# Patient Record
Sex: Male | Born: 1940
Health system: Southern US, Academic
[De-identification: ages and names within clinical notes are randomized; demographics above are authoritative.]

## PROBLEM LIST (undated history)

## (undated) ENCOUNTER — Encounter

## (undated) ENCOUNTER — Telehealth

## (undated) ENCOUNTER — Ambulatory Visit: Payer: MEDICARE | Attending: Surgery | Primary: Surgery

## (undated) ENCOUNTER — Ambulatory Visit

## (undated) ENCOUNTER — Ambulatory Visit: Payer: MEDICARE

## (undated) ENCOUNTER — Ambulatory Visit: Payer: MEDICARE | Attending: Anesthesiology | Primary: Anesthesiology

## (undated) ENCOUNTER — Ambulatory Visit: Payer: MEDICARE | Attending: Nephrology | Primary: Nephrology

## (undated) ENCOUNTER — Encounter: Attending: Nephrology | Primary: Nephrology

## (undated) ENCOUNTER — Telehealth: Attending: Oncology | Primary: Oncology

## (undated) ENCOUNTER — Telehealth: Attending: Medical Oncology | Primary: Medical Oncology

## (undated) ENCOUNTER — Encounter: Attending: Oncology | Primary: Oncology

## (undated) DIAGNOSIS — K922 Gastrointestinal hemorrhage, unspecified: Secondary | ICD-10-CM

## (undated) DIAGNOSIS — J189 Pneumonia, unspecified organism: Secondary | ICD-10-CM

## (undated) DIAGNOSIS — Z940 Kidney transplant status: Secondary | ICD-10-CM

## (undated) DIAGNOSIS — I499 Cardiac arrhythmia, unspecified: Secondary | ICD-10-CM

## (undated) DIAGNOSIS — L97529 Non-pressure chronic ulcer of other part of left foot with unspecified severity: Secondary | ICD-10-CM

## (undated) DIAGNOSIS — K649 Unspecified hemorrhoids: Secondary | ICD-10-CM

## (undated) DIAGNOSIS — R51 Headache: Secondary | ICD-10-CM

## (undated) DIAGNOSIS — E042 Nontoxic multinodular goiter: Secondary | ICD-10-CM

## (undated) DIAGNOSIS — H9190 Unspecified hearing loss, unspecified ear: Secondary | ICD-10-CM

## (undated) DIAGNOSIS — N419 Inflammatory disease of prostate, unspecified: Secondary | ICD-10-CM

## (undated) DIAGNOSIS — N529 Male erectile dysfunction, unspecified: Secondary | ICD-10-CM

## (undated) DIAGNOSIS — L97519 Non-pressure chronic ulcer of other part of right foot with unspecified severity: Secondary | ICD-10-CM

## (undated) DIAGNOSIS — IMO0002 Reserved for concepts with insufficient information to code with codable children: Secondary | ICD-10-CM

## (undated) DIAGNOSIS — M109 Gout, unspecified: Secondary | ICD-10-CM

## (undated) DIAGNOSIS — N400 Enlarged prostate without lower urinary tract symptoms: Secondary | ICD-10-CM

## (undated) DIAGNOSIS — M1710 Unilateral primary osteoarthritis, unspecified knee: Secondary | ICD-10-CM

## (undated) DIAGNOSIS — I272 Pulmonary hypertension, unspecified: Secondary | ICD-10-CM

## (undated) DIAGNOSIS — I10 Essential (primary) hypertension: Secondary | ICD-10-CM

## (undated) DIAGNOSIS — I4819 Other persistent atrial fibrillation: Secondary | ICD-10-CM

## (undated) DIAGNOSIS — E785 Hyperlipidemia, unspecified: Secondary | ICD-10-CM

## (undated) DIAGNOSIS — K219 Gastro-esophageal reflux disease without esophagitis: Secondary | ICD-10-CM

## (undated) DIAGNOSIS — C8590 Non-Hodgkin lymphoma, unspecified, unspecified site: Secondary | ICD-10-CM

## (undated) DIAGNOSIS — N186 End stage renal disease: Secondary | ICD-10-CM

## (undated) HISTORY — DX: Unilateral primary osteoarthritis, unspecified knee: M17.10

## (undated) HISTORY — DX: Other persistent atrial fibrillation: I48.19

## (undated) HISTORY — DX: End stage renal disease: N18.6

## (undated) HISTORY — PX: BACK SURGERY: SHX140

## (undated) HISTORY — PX: TOTAL KNEE ARTHROPLASTY: SHX125

## (undated) HISTORY — DX: Non-pressure chronic ulcer of other part of left foot with unspecified severity: L97.529

## (undated) HISTORY — DX: Gastrointestinal hemorrhage, unspecified: K92.2

## (undated) HISTORY — DX: Gout, unspecified: M10.9

## (undated) HISTORY — DX: Essential (primary) hypertension: I10

## (undated) HISTORY — DX: Inflammatory disease of prostate, unspecified: N41.9

## (undated) HISTORY — DX: Unspecified hemorrhoids: K64.9

## (undated) HISTORY — PX: PROSTATE ABLATION: SHX6042

## (undated) HISTORY — DX: Pulmonary hypertension, unspecified: I27.20

## (undated) HISTORY — DX: Male erectile dysfunction, unspecified: N52.9

## (undated) HISTORY — DX: Nontoxic multinodular goiter: E04.2

## (undated) HISTORY — DX: Kidney transplant status: Z94.0

## (undated) HISTORY — DX: Non-pressure chronic ulcer of other part of right foot with unspecified severity: L97.519

## (undated) HISTORY — DX: Gastro-esophageal reflux disease without esophagitis: K21.9

## (undated) HISTORY — DX: Benign prostatic hyperplasia without lower urinary tract symptoms: N40.0

## (undated) HISTORY — DX: Unspecified hearing loss, unspecified ear: H91.90

## (undated) HISTORY — DX: Hyperlipidemia, unspecified: E78.5

## (undated) HISTORY — PX: STOMACH SURGERY: SHX791

## (undated) HISTORY — PX: THROAT SURGERY: SHX803

## (undated) HISTORY — DX: Headache: R51

## (undated) HISTORY — DX: Reserved for concepts with insufficient information to code with codable children: IMO0002

---
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
## 1972-06-23 HISTORY — PX: HERNIA REPAIR: SHX51

## 1996-06-23 HISTORY — PX: AV FISTULA PLACEMENT: SHX1204

## 2004-06-23 HISTORY — PX: KIDNEY TRANSPLANT: SHX239

## 2004-08-21 ENCOUNTER — Ambulatory Visit: Payer: Self-pay | Admitting: Nephrology

## 2005-04-22 ENCOUNTER — Ambulatory Visit (HOSPITAL_COMMUNITY): Admission: RE | Admit: 2005-04-22 | Discharge: 2005-04-22 | Payer: Self-pay | Admitting: Orthopedic Surgery

## 2006-02-12 ENCOUNTER — Ambulatory Visit: Payer: Self-pay | Admitting: Internal Medicine

## 2006-02-12 ENCOUNTER — Inpatient Hospital Stay (HOSPITAL_COMMUNITY): Admission: RE | Admit: 2006-02-12 | Discharge: 2006-02-16 | Payer: Self-pay | Admitting: Orthopedic Surgery

## 2006-02-13 ENCOUNTER — Encounter: Payer: Self-pay | Admitting: Cardiology

## 2006-02-20 ENCOUNTER — Ambulatory Visit: Payer: Self-pay | Admitting: Orthopedic Surgery

## 2006-02-24 ENCOUNTER — Ambulatory Visit: Payer: Self-pay

## 2006-02-26 ENCOUNTER — Ambulatory Visit: Payer: Self-pay

## 2006-03-02 ENCOUNTER — Ambulatory Visit: Payer: Self-pay | Admitting: Orthopedic Surgery

## 2006-03-05 ENCOUNTER — Ambulatory Visit: Payer: Self-pay

## 2006-03-09 ENCOUNTER — Ambulatory Visit: Payer: Self-pay

## 2006-03-12 ENCOUNTER — Ambulatory Visit: Payer: Self-pay | Admitting: Orthopedic Surgery

## 2006-03-12 ENCOUNTER — Ambulatory Visit: Payer: Self-pay | Admitting: Internal Medicine

## 2006-03-17 ENCOUNTER — Ambulatory Visit: Payer: Self-pay | Admitting: Cardiovascular Disease

## 2006-03-24 ENCOUNTER — Ambulatory Visit: Payer: Self-pay | Admitting: *Deleted

## 2006-03-31 ENCOUNTER — Ambulatory Visit: Payer: Self-pay | Admitting: Internal Medicine

## 2006-04-09 ENCOUNTER — Ambulatory Visit: Payer: Self-pay | Admitting: Internal Medicine

## 2006-04-16 ENCOUNTER — Ambulatory Visit: Payer: Self-pay | Admitting: Cardiology

## 2006-04-21 ENCOUNTER — Ambulatory Visit: Payer: Self-pay | Admitting: Cardiology

## 2006-04-27 ENCOUNTER — Ambulatory Visit (HOSPITAL_COMMUNITY): Admission: RE | Admit: 2006-04-27 | Discharge: 2006-04-27 | Payer: Self-pay | Admitting: Internal Medicine

## 2006-04-27 ENCOUNTER — Ambulatory Visit: Payer: Self-pay | Admitting: Internal Medicine

## 2006-05-01 ENCOUNTER — Ambulatory Visit: Payer: Self-pay | Admitting: Cardiology

## 2006-05-07 ENCOUNTER — Ambulatory Visit: Payer: Self-pay | Admitting: Cardiology

## 2006-05-18 ENCOUNTER — Ambulatory Visit: Payer: Self-pay | Admitting: Internal Medicine

## 2006-05-22 ENCOUNTER — Ambulatory Visit: Payer: Self-pay | Admitting: Cardiology

## 2006-05-22 ENCOUNTER — Ambulatory Visit: Payer: Self-pay | Admitting: Internal Medicine

## 2006-06-12 ENCOUNTER — Ambulatory Visit: Payer: Self-pay | Admitting: Cardiovascular Disease

## 2006-07-07 ENCOUNTER — Ambulatory Visit: Payer: Self-pay | Admitting: Internal Medicine

## 2006-07-17 ENCOUNTER — Ambulatory Visit: Payer: Self-pay | Admitting: Internal Medicine

## 2006-08-14 ENCOUNTER — Ambulatory Visit: Payer: Self-pay | Admitting: Internal Medicine

## 2006-08-26 ENCOUNTER — Ambulatory Visit: Payer: Self-pay | Admitting: Internal Medicine

## 2006-09-10 ENCOUNTER — Ambulatory Visit: Payer: Self-pay | Admitting: Cardiology

## 2006-10-20 ENCOUNTER — Ambulatory Visit: Payer: Self-pay | Admitting: Cardiology

## 2006-11-17 ENCOUNTER — Ambulatory Visit: Payer: Self-pay | Admitting: Cardiology

## 2006-12-15 ENCOUNTER — Ambulatory Visit: Payer: Self-pay | Admitting: Cardiology

## 2006-12-29 ENCOUNTER — Ambulatory Visit: Payer: Self-pay | Admitting: Cardiology

## 2007-01-11 ENCOUNTER — Ambulatory Visit: Payer: Self-pay | Admitting: Internal Medicine

## 2007-01-12 ENCOUNTER — Ambulatory Visit: Payer: Self-pay | Admitting: Internal Medicine

## 2007-01-19 ENCOUNTER — Ambulatory Visit: Payer: Self-pay | Admitting: Internal Medicine

## 2007-02-02 ENCOUNTER — Ambulatory Visit: Payer: Self-pay | Admitting: Cardiology

## 2007-02-23 ENCOUNTER — Ambulatory Visit: Payer: Self-pay | Admitting: Cardiology

## 2007-03-29 ENCOUNTER — Ambulatory Visit: Payer: Self-pay | Admitting: Internal Medicine

## 2007-04-14 ENCOUNTER — Ambulatory Visit: Payer: Self-pay | Admitting: Internal Medicine

## 2007-05-03 ENCOUNTER — Ambulatory Visit: Payer: Self-pay

## 2007-05-17 ENCOUNTER — Ambulatory Visit: Payer: Self-pay

## 2007-05-26 ENCOUNTER — Ambulatory Visit: Payer: Self-pay

## 2007-06-07 ENCOUNTER — Ambulatory Visit: Payer: Self-pay | Admitting: Cardiology

## 2007-06-29 ENCOUNTER — Ambulatory Visit: Payer: Self-pay | Admitting: Internal Medicine

## 2007-06-29 LAB — CONVERTED CEMR LAB: Uric Acid, Serum: 8.8 mg/dL — ABNORMAL HIGH (ref 4.0–7.8)

## 2007-07-27 ENCOUNTER — Ambulatory Visit: Payer: Self-pay | Admitting: Cardiology

## 2007-08-27 ENCOUNTER — Ambulatory Visit: Payer: Self-pay | Admitting: Cardiology

## 2007-09-03 ENCOUNTER — Ambulatory Visit: Payer: Self-pay | Admitting: Cardiology

## 2007-09-08 ENCOUNTER — Ambulatory Visit: Payer: Self-pay | Admitting: Cardiology

## 2007-09-21 ENCOUNTER — Ambulatory Visit: Payer: Self-pay | Admitting: Internal Medicine

## 2007-10-19 ENCOUNTER — Ambulatory Visit: Payer: Self-pay | Admitting: Internal Medicine

## 2007-10-25 ENCOUNTER — Ambulatory Visit: Payer: Self-pay | Admitting: Internal Medicine

## 2007-11-19 ENCOUNTER — Ambulatory Visit: Payer: Self-pay | Admitting: Cardiology

## 2007-12-09 ENCOUNTER — Ambulatory Visit: Payer: Self-pay | Admitting: Internal Medicine

## 2007-12-17 ENCOUNTER — Ambulatory Visit: Payer: Self-pay | Admitting: Cardiology

## 2007-12-23 ENCOUNTER — Ambulatory Visit: Payer: Self-pay | Admitting: Cardiology

## 2008-01-07 ENCOUNTER — Ambulatory Visit: Payer: Self-pay

## 2008-01-28 ENCOUNTER — Ambulatory Visit: Payer: Self-pay

## 2008-03-03 ENCOUNTER — Ambulatory Visit: Payer: Self-pay | Admitting: Cardiology

## 2008-03-06 ENCOUNTER — Ambulatory Visit: Payer: Self-pay | Admitting: Cardiology

## 2008-03-13 ENCOUNTER — Ambulatory Visit: Payer: Self-pay | Admitting: Cardiology

## 2008-04-10 ENCOUNTER — Ambulatory Visit: Payer: Self-pay | Admitting: Internal Medicine

## 2008-04-26 ENCOUNTER — Ambulatory Visit: Payer: Self-pay | Admitting: Internal Medicine

## 2008-05-08 ENCOUNTER — Ambulatory Visit: Payer: Self-pay | Admitting: Internal Medicine

## 2008-06-05 ENCOUNTER — Ambulatory Visit: Payer: Self-pay | Admitting: Internal Medicine

## 2008-07-04 ENCOUNTER — Ambulatory Visit: Payer: Self-pay | Admitting: Cardiology

## 2008-07-18 ENCOUNTER — Ambulatory Visit: Payer: Self-pay | Admitting: Cardiology

## 2008-08-08 ENCOUNTER — Ambulatory Visit: Payer: Self-pay | Admitting: Cardiology

## 2008-08-24 ENCOUNTER — Ambulatory Visit: Payer: Self-pay | Admitting: Cardiology

## 2008-09-14 ENCOUNTER — Ambulatory Visit: Payer: Self-pay | Admitting: Internal Medicine

## 2008-09-27 ENCOUNTER — Ambulatory Visit: Payer: Self-pay | Admitting: Cardiology

## 2008-10-25 DIAGNOSIS — Z87898 Personal history of other specified conditions: Secondary | ICD-10-CM | POA: Insufficient documentation

## 2008-10-25 DIAGNOSIS — I4891 Unspecified atrial fibrillation: Secondary | ICD-10-CM | POA: Insufficient documentation

## 2008-10-25 DIAGNOSIS — IMO0002 Reserved for concepts with insufficient information to code with codable children: Secondary | ICD-10-CM | POA: Insufficient documentation

## 2008-10-25 DIAGNOSIS — E785 Hyperlipidemia, unspecified: Secondary | ICD-10-CM | POA: Insufficient documentation

## 2008-10-25 DIAGNOSIS — M1710 Unilateral primary osteoarthritis, unspecified knee: Secondary | ICD-10-CM

## 2008-10-25 DIAGNOSIS — K219 Gastro-esophageal reflux disease without esophagitis: Secondary | ICD-10-CM | POA: Insufficient documentation

## 2008-10-25 DIAGNOSIS — N186 End stage renal disease: Secondary | ICD-10-CM | POA: Insufficient documentation

## 2008-10-25 DIAGNOSIS — I10 Essential (primary) hypertension: Secondary | ICD-10-CM | POA: Insufficient documentation

## 2008-11-03 ENCOUNTER — Telehealth (INDEPENDENT_AMBULATORY_CARE_PROVIDER_SITE_OTHER): Payer: Self-pay | Admitting: *Deleted

## 2008-12-09 ENCOUNTER — Encounter: Payer: Self-pay | Admitting: Internal Medicine

## 2009-01-24 ENCOUNTER — Telehealth: Payer: Self-pay | Admitting: Cardiology

## 2009-01-29 ENCOUNTER — Encounter: Payer: Self-pay | Admitting: Cardiology

## 2009-01-29 ENCOUNTER — Encounter: Payer: Self-pay | Admitting: Internal Medicine

## 2009-01-29 LAB — CONVERTED CEMR LAB
INR: 3.6
Prothrombin Time: 34.9 s

## 2009-01-31 ENCOUNTER — Encounter: Payer: Self-pay | Admitting: Internal Medicine

## 2009-02-05 ENCOUNTER — Encounter: Payer: Self-pay | Admitting: *Deleted

## 2009-02-19 ENCOUNTER — Encounter (INDEPENDENT_AMBULATORY_CARE_PROVIDER_SITE_OTHER): Payer: Self-pay | Admitting: *Deleted

## 2009-02-28 ENCOUNTER — Encounter: Payer: Self-pay | Admitting: Internal Medicine

## 2009-03-01 ENCOUNTER — Encounter: Payer: Self-pay | Admitting: Internal Medicine

## 2009-03-01 LAB — CONVERTED CEMR LAB
POC INR: 2.6
Prothrombin Time: 27 s

## 2009-04-05 ENCOUNTER — Encounter: Payer: Self-pay | Admitting: Internal Medicine

## 2009-04-10 ENCOUNTER — Encounter: Payer: Self-pay | Admitting: Cardiovascular Disease

## 2009-04-10 ENCOUNTER — Telehealth: Payer: Self-pay | Admitting: Internal Medicine

## 2009-04-10 LAB — CONVERTED CEMR LAB: INR: 2

## 2009-04-23 ENCOUNTER — Telehealth: Payer: Self-pay | Admitting: Internal Medicine

## 2009-05-21 ENCOUNTER — Encounter: Payer: Self-pay | Admitting: Internal Medicine

## 2009-05-22 ENCOUNTER — Encounter: Payer: Self-pay | Admitting: Cardiology

## 2009-05-22 LAB — CONVERTED CEMR LAB
INR: 2.3
Prothrombin Time: 24.7 s

## 2009-07-06 ENCOUNTER — Encounter: Payer: Self-pay | Admitting: Internal Medicine

## 2009-07-09 ENCOUNTER — Encounter: Payer: Self-pay | Admitting: Internal Medicine

## 2009-07-09 LAB — CONVERTED CEMR LAB
INR: 1.8
Prothrombin Time: 19.1 s

## 2009-07-31 ENCOUNTER — Encounter: Payer: Self-pay | Admitting: Internal Medicine

## 2009-08-01 ENCOUNTER — Encounter: Payer: Self-pay | Admitting: Cardiovascular Disease

## 2009-08-01 LAB — CONVERTED CEMR LAB
INR: 2.6
Prothrombin Time: 27.1 s

## 2009-09-11 ENCOUNTER — Encounter: Payer: Self-pay | Admitting: Internal Medicine

## 2009-09-12 ENCOUNTER — Encounter: Payer: Self-pay | Admitting: Cardiovascular Disease

## 2009-09-26 ENCOUNTER — Encounter: Payer: Self-pay | Admitting: Cardiovascular Disease

## 2009-09-26 LAB — CONVERTED CEMR LAB: INR: 1.9

## 2009-10-25 ENCOUNTER — Encounter: Payer: Self-pay | Admitting: Cardiovascular Disease

## 2009-10-26 ENCOUNTER — Encounter: Payer: Self-pay | Admitting: Cardiovascular Disease

## 2009-10-29 ENCOUNTER — Encounter: Payer: Self-pay | Admitting: Cardiovascular Disease

## 2009-11-15 ENCOUNTER — Encounter: Payer: Self-pay | Admitting: Internal Medicine

## 2009-11-15 ENCOUNTER — Ambulatory Visit: Payer: Self-pay | Admitting: Internal Medicine

## 2009-11-15 LAB — CONVERTED CEMR LAB: POC INR: 2.2

## 2009-12-10 ENCOUNTER — Encounter: Payer: Self-pay | Admitting: Internal Medicine

## 2009-12-11 ENCOUNTER — Encounter: Payer: Self-pay | Admitting: Cardiovascular Disease

## 2010-01-25 ENCOUNTER — Encounter: Payer: Self-pay | Admitting: Internal Medicine

## 2010-01-29 ENCOUNTER — Encounter: Payer: Self-pay | Admitting: Cardiovascular Disease

## 2010-02-22 ENCOUNTER — Telehealth: Payer: Self-pay | Admitting: Internal Medicine

## 2010-02-26 ENCOUNTER — Encounter: Payer: Self-pay | Admitting: Internal Medicine

## 2010-02-27 ENCOUNTER — Encounter: Payer: Self-pay | Admitting: Cardiovascular Disease

## 2010-03-08 ENCOUNTER — Telehealth: Payer: Self-pay | Admitting: Internal Medicine

## 2010-03-25 ENCOUNTER — Ambulatory Visit: Payer: Self-pay | Admitting: Cardiovascular Disease

## 2010-03-26 ENCOUNTER — Encounter: Payer: Self-pay | Admitting: Internal Medicine

## 2010-03-26 LAB — CONVERTED CEMR LAB
INR: 2.06 — ABNORMAL HIGH (ref <1.50)
POC INR: 2.06
Prothrombin Time: 23.4 s — ABNORMAL HIGH (ref 11.6–15.2)

## 2010-04-15 ENCOUNTER — Telehealth: Payer: Self-pay | Admitting: Internal Medicine

## 2010-04-25 ENCOUNTER — Ambulatory Visit: Payer: Self-pay | Admitting: Internal Medicine

## 2010-04-26 LAB — CONVERTED CEMR LAB
INR: 1.94 — ABNORMAL HIGH (ref <1.50)
POC INR: 1.94
Prothrombin Time: 22.3 s
Prothrombin Time: 22.3 s — ABNORMAL HIGH (ref 11.6–15.2)

## 2010-05-27 ENCOUNTER — Ambulatory Visit: Payer: Self-pay | Admitting: Cardiovascular Disease

## 2010-05-29 LAB — CONVERTED CEMR LAB
INR: 1.59 — ABNORMAL HIGH (ref <1.50)
POC INR: 1.59
Prothrombin Time: 19.1 s — ABNORMAL HIGH (ref 11.6–15.2)

## 2010-06-28 ENCOUNTER — Ambulatory Visit
Admission: RE | Admit: 2010-06-28 | Discharge: 2010-06-28 | Payer: Self-pay | Source: Home / Self Care | Attending: Cardiovascular Disease | Admitting: Cardiovascular Disease

## 2010-07-01 LAB — CONVERTED CEMR LAB
INR: 2.39 — ABNORMAL HIGH (ref <1.50)
POC INR: 2.39
Prothrombin Time: 26.2 s
Prothrombin Time: 26.2 s — ABNORMAL HIGH (ref 11.6–15.2)

## 2010-07-23 NOTE — Medication Information (Signed)
Summary: Coumadin Clinic  Anticoagulant Therapy  Managed by: Charlena Cross, RN, BSN Referring MD: Arvilla Meres Supervising MD: Mariah Milling Indication 1: Atrial Fibrillation (ICD-427.31) Lab Used: [facility] Site: [place] INR RANGE 2.0-3.0  Dietary changes: no    Health status changes: no    Bleeding/hemorrhagic complications: no    Recent/future hospitalizations: no    Any changes in medication regimen? no    Recent/future dental: no  Any missed doses?: no       Is patient compliant with meds? yes       Anticoagulation Management History:      The patient is taking warfarin and comes in today for a routine follow up visit.  Positive risk factors for bleeding include an age of 83 years or older.  The bleeding index is 'intermediate risk'.  Positive CHADS2 values include History of HTN.  Negative CHADS2 values include Age > 87 years old.  The start date was 03/16/2006.  His last INR was 2.6 and today's INR is 1.9.  Anticoagulation responsible provider: Gollan.  INR POC: 2.6.    Anticoagulation Management Assessment/Plan:      The patient's current anticoagulation dose is Warfarin sodium 4 mg tabs: Take 1 tablet by mouth as directed.  The target INR is 2 - 3.  The next INR is due 09/26/2009.  Anticoagulation instructions were given to patient.  Results were reviewed/authorized by Charlena Cross, RN, BSN.  He was notified by Charlena Cross, RN, BSN.         Prior Anticoagulation Instructions: coumadin 8mg  today then resume coumadin 4mg   Current Anticoagulation Instructions: coumadin 8 mg today then resume coumadin 4 mg daily

## 2010-07-23 NOTE — Medication Information (Signed)
Summary: Coumadin Clinic  Anticoagulant Therapy  Managed by: Charlena Cross, RN, BSN Referring MD: Arvilla Meres Supervising MD: Gala Romney MD, Reuel Boom Indication 1: Atrial Fibrillation (ICD-427.31) Lab Used: [facility]--[place] Site: [place] [facility] 19.1 INR RANGE 2.0-3.0  Dietary changes: no    Health status changes: no    Bleeding/hemorrhagic complications: no    Recent/future hospitalizations: no    Any changes in medication regimen? no    Recent/future dental: no  Any missed doses?: no       Is patient compliant with meds? yes       Anticoagulation Management History:      His anticoagulation is being managed by telephone today.  Positive risk factors for bleeding include an age of 17 years or older.  The bleeding index is 'intermediate risk'.  Positive CHADS2 values include History of HTN.  Negative CHADS2 values include Age > 73 years old.  The start date was 03/16/2006.  His last INR was 2.3 and today's INR is 1.8.  Prothrombin time is 19.1.  Anticoagulation responsible provider: Nyashia Raney MD, Reuel Boom.    Anticoagulation Management Assessment/Plan:      The patient's current anticoagulation dose is Warfarin sodium 4 mg tabs: Take 1 tablet by mouth as directed.  The target INR is 2 - 3.  The next INR is due 08/06/2009.  Anticoagulation instructions were given to patient.  Results were reviewed/authorized by Charlena Cross, RN, BSN.  He was notified by Charlena Cross, RN, BSN.         Prior Anticoagulation Instructions: The patient is to continue with the same dose of coumadin.  This dosage includes: coumadin 4mg  daily  Current Anticoagulation Instructions: coumadin 6 mg today then resume coumadin 4 mg daily.

## 2010-07-23 NOTE — Medication Information (Signed)
Summary: Coumadin Clinic  Anticoagulant Therapy  Managed by: Charlena Cross, RN, BSN Referring MD: Arvilla Meres Supervising MD: Mariah Milling Indication 1: Atrial Fibrillation (ICD-427.31) Lab Used: [facility] Site: [place] [facility] 27.1 INR RANGE 2.0-3.0  Dietary changes: no    Health status changes: no    Bleeding/hemorrhagic complications: no    Recent/future hospitalizations: no    Any changes in medication regimen? no    Recent/future dental: no  Any missed doses?: no       Is patient compliant with meds? yes       Anticoagulation Management History:      His anticoagulation is being managed by telephone today.  Positive risk factors for bleeding include an age of 71 years or older.  The bleeding index is 'intermediate risk'.  Positive CHADS2 values include History of HTN.  Negative CHADS2 values include Age > 65 years old.  The start date was 03/16/2006.  His last INR was 1.8 and today's INR is 2.6.  Prothrombin time is 27.1.  Anticoagulation responsible provider: Gollan.    Anticoagulation Management Assessment/Plan:      The patient's current anticoagulation dose is Warfarin sodium 4 mg tabs: Take 1 tablet by mouth as directed.  The target INR is 2 - 3.  The next INR is due 08/29/2009.  Anticoagulation instructions were given to patient.  Results were reviewed/authorized by Charlena Cross, RN, BSN.  He was notified by Charlena Cross, RN, BSN.         Prior Anticoagulation Instructions: coumadin 6 mg today then resume coumadin 4 mg daily.  Current Anticoagulation Instructions: The patient is to continue with the same dose of coumadin.  This dosage includes: coumadin 4mg  daily

## 2010-07-23 NOTE — Progress Notes (Signed)
Summary: COUMADIN  Phone Note Call from Patient Call back at Home Phone [phone]   Caller: SELF Call For: BENSIMHON Summary of Call: PT WOULD LIKE A CALL BACK ABOUT GETTING HIS COUMADIN DRAWN-HE THOUGHT THAT THIS WAS SET UP IN [place]-THEY STATED THAT THEY WOULD NOT BE ABLE TO DO IT Initial call taken by: Harlon Flor,  March 08, 2010 3:09 PM  Follow-up for Phone Call        Marchelle Folks would you schedule him for a lab draw please. Benedict Needy, RN  March 08, 2010 3:14 PM   Has this been scheduled? If not please schedule Benedict Needy, RN  March 18, 2010 4:28 PM   Additional Follow-up for Phone Call Additional follow up Details #1::        LMOM to schedule appt. Additional Follow-up by: Harlon Flor,  March 25, 2010 8:16 AM    Additional Follow-up for Phone Call Additional follow up Details #2::    pt had PT/INR drawn this am  Follow-up by: Benedict Needy, RN,  March 25, 2010 4:08 PM

## 2010-07-23 NOTE — Medication Information (Signed)
Summary: Coumadin Clinic  Anticoagulant Therapy  Managed by: Cloyde Reams, RN, BSN Referring MD: Arvilla Meres Supervising MD: Mariah Milling Indication 1: Atrial Fibrillation (ICD-427.31) Lab Used: [facility]  Site: [place] INR POC 2.2 INR RANGE 2.0-3.0    Bleeding/hemorrhagic complications: no     Any changes in medication regimen? no     Any missed doses?: no       Is patient compliant with meds? yes       Allergies: No Known Drug Allergies  Anticoagulation Management History:      The patient is taking warfarin and comes in today for a routine follow up visit.  Positive risk factors for bleeding include an age of 71 years or older.  The bleeding index is 'intermediate risk'.  Positive CHADS2 values include History of HTN.  Negative CHADS2 values include Age > 71 years old.  The start date was 03/16/2006.  His last INR was 1.9.  Anticoagulation responsible provider: Gollan.  INR POC: 2.2.  Cuvette Lot#: [id number].  Exp: 01/2011.    Anticoagulation Management Assessment/Plan:      The patient's current anticoagulation dose is Warfarin sodium 4 mg tabs: Take 1 tablet by mouth as directed.  The target INR is 2 - 3.  The next INR is due 12/13/2009.  Anticoagulation instructions were given to patient.  Results were reviewed/authorized by Cloyde Reams, RN, BSN.  He was notified by Cloyde Reams RN.         Prior Anticoagulation Instructions: INR 1.8  Called spoke with pt.  Advised to start taking 4mg  daily except 6mg  on Mondays.  Recheck in 2 weeks.    Current Anticoagulation Instructions: INR 2.2  Continue on same dosage.  Recheck in 4 weeks.

## 2010-07-23 NOTE — Miscellaneous (Signed)
Summary: Orders Update  Clinical Lists Changes  Orders: Added new Test order of T-Protime, Auto ([id number]) - Signed 

## 2010-07-23 NOTE — Assessment & Plan Note (Signed)
Summary: ROV/AMD   Visit Type:  Follow-up Primary Provider:  Dr Carlene Coria  CC:  No complaints.  History of Present Illness: Matthew Brown is a delightful 70 year old male with a history of chronic atrial fibrillation and flutter with a normal ejection fraction.  He also has a negative functional study in [place] in 2006.  Remainder of his medical history is notable for end-stage renal disease status post kidney transplant in May 2006, now with normal creatinine, hypertension, and gastroesophageal reflux disease.   Doing great. No CP, shortness of breath or palpitations. No swelling. INR recently a little low and coumadin adjusted. No probles with bleeding. Very active.    Current Medications (verified): 1)  Allopurinol 100 Mg Tabs (Allopurinol) 2)  Warfarin Sodium 4 Mg Tabs (Warfarin Sodium) .... Take 1 Tablet By Mouth As Directed 3)  Furosemide 20 Mg Tabs (Furosemide) .... 2 in The Am and 2 in The Pm 4)  [employer] Senior  Tabs (Multiple Vitamins-Minerals) .[name].. 1 By Mouth Once Daily 5)  Nexium 40 Mg Cpdr (Esomeprazole Magnesium) .[name].. 1 By Mouth Two Times A Day 6)  Finasteride 5 Mg Tabs (Finasteride) .[name].. 1 By Mouth Once Daily 7)  Myfortic 360 Mg Tbec (Mycophenolate Sodium) .[name].. 1 By Mouth Two Times A Day 8)  Flomax 0.4 Mg Xr24h-Cap (Tamsulosin Hcl) .[name].. 1 By Mouth Once Daily 9)  Enalapril Maleate 10 Mg Tabs (Enalapril Maleate) .... Take One Tablet By Mouth Two Times A Day 10)  Metoprolol Succinate 200 Mg Xr24h-Tab (Metoprolol Succinate) .... Take One Tablet By Mouth Two Times A Day 11)  Aspirin 81 Mg Tbec (Aspirin) .... Take One Tablet By Mouth Daily 12)  Sensipar 30 Mg Tabs (Cinacalcet Hcl) .... 1/2 By Mouth Once Daily 13)  Prograf 1 Mg Caps (Tacrolimus) .... Take 1 Tablet By Mouth Three Times A Day 14)  Oxybutynin Chloride 5 Mg Xr24h-Tab (Oxybutynin Chloride) .... Take 1 Tablet By Mouth Once A Day  Allergies (verified): No Known Drug Allergies  Past History:  Past Medical History: Last  updated: 10/25/2008 ATRIAL FIBRILLATION (ICD-427.31) HYPERLIPIDEMIA-MIXED (ICD-272.4) HYPERTENSION, UNSPECIFIED (ICD-401.9) BENIGN PROSTATIC HYPERTROPHY, HX OF (ICD-V13.8) GERD (ICD-530.81) RENAL FAILURE, END STAGE (ICD-585.6) DEGENERATIVE JOINT DISEASE, KNEE (ICD-715.96)    Family History: Last updated: 10/25/2008 Family History of Cancer:  Family History of Coronary Artery Disease:  Family History of Diabetes:   Social History: Last updated: 10/25/2008 Retired  Divorced  Tobacco Use - Former. - 1980's Alcohol Use - yes - occasionally Regular Exercise - no  Risk Factors: Exercise: no (10/25/2008)  Risk Factors: Smoking Status: quit (10/25/2008)  Review of Systems       As per HPI and past medical history; otherwise all systems negative.   Vital Signs:  Patient profile:   71 year old male Height:      67 inches Weight:      168 pounds BMI:     26.41 Pulse rate:   66 / minute BP sitting:   108 / 72  (left arm) Cuff size:   regular  Vitals Entered By: Hardin Negus, RMA (Nov 15, 2009 4:28 PM)  Physical Exam  General:  Gen: well appearing. no resp difficulty HEENT: normal Neck: supple. no JVD. Carotids 2+ bilat; no bruits. No lymphadenopathy or thryomegaly appreciated. Cor: PMI nondisplaced. Irregular  rhythm. No rubs, gallops, murmur. Lungs: clear Abdomen: soft, nontender, nondistended. No hepatosplenomegaly.  Extremities: no cyanosis, clubbing, rash, edema. AVF in R forearm Neuro: alert & orientedx3, cranial nerves grossly intact. moves all 4 extremities w/o  difficulty. affect pleasant    Impression & Recommendations:  Problem # 1:  ATRIAL FIBRILLATION (ICD-427.31) Doing well with rate control. Tolerating coumadin.   Problem # 2:  HYPERTENSION, UNSPECIFIED (ICD-401.9) Blood pressure well controlled. Continue current regimen.  Patient Instructions: 1)  Your physician recommends that you schedule a follow-up appointment in: 12 months

## 2010-07-23 NOTE — Progress Notes (Signed)
Summary: CALL  Phone Note Call from Patient Call back at Home Phone [phone]   Caller: SELF Call For: BENSIMHON Summary of Call: PT WOULD LIKE A CALL BACK-NOT SURE IF THE PAIN IN HIS TOES IS FROM GOUT OR IF IT IS CAUSED BY HIS COUMADIN Initial call taken by: Harlon Flor,  February 22, 2010 10:03 AM  Follow-up for Phone Call        [facility] TCB Benedict Needy, RN  February 22, 2010 10:17 AM   Called pt he c/o pain in left big toe.  Pt has hx of gout and has medications to take as needed for gout. Pt wanted to know how to tell if the pain was coming from gout or his coumadin. Told pt to take his gout meds and if the pain did not improve that he should see his PCP.  Follow-up by: Benedict Needy, RN,  February 22, 2010 10:25 AM

## 2010-07-23 NOTE — Progress Notes (Signed)
Summary: PLEASE CALL  Phone Note Call from Patient Call back at Home Phone [phone]   Caller: SELF Call For: BENSIMHON Summary of Call: [facility] THAT HE WOULD LIKE A CALL BACK FROM THE NURSE Initial call taken by: Harlon Flor,  April 15, 2010 4:04 PM  Follow-up for Phone Call        LM with family member TCB Benedict Needy, RN  April 16, 2010 9:22 AM   Pt wanted to speak to [facility]  Follow-up by: Benedict Needy, RN,  April 16, 2010 11:48 AM

## 2010-07-26 ENCOUNTER — Ambulatory Visit: Admit: 2010-07-26 | Payer: Self-pay | Admitting: Internal Medicine

## 2010-07-26 ENCOUNTER — Other Ambulatory Visit: Payer: Self-pay

## 2010-07-30 ENCOUNTER — Telehealth: Payer: Self-pay | Admitting: Internal Medicine

## 2010-07-31 ENCOUNTER — Encounter (INDEPENDENT_AMBULATORY_CARE_PROVIDER_SITE_OTHER): Payer: MEDICARE

## 2010-07-31 ENCOUNTER — Encounter: Payer: Self-pay | Admitting: Internal Medicine

## 2010-07-31 DIAGNOSIS — Z7901 Long term (current) use of anticoagulants: Secondary | ICD-10-CM

## 2010-07-31 DIAGNOSIS — I4891 Unspecified atrial fibrillation: Secondary | ICD-10-CM

## 2010-07-31 LAB — CONVERTED CEMR LAB
INR: 2.33 — ABNORMAL HIGH (ref <1.50)
POC INR: 2.33
Prothrombin Time: 25.7 s
Prothrombin Time: 25.7 s — ABNORMAL HIGH (ref 11.6–15.2)
aPTT: 35 s (ref 24–37)

## 2010-08-08 NOTE — Progress Notes (Signed)
Summary: RX  Phone Note Refill Request Call back at Home Phone [phone] Message from:  Patient on July 30, 2010 8:14 AM  Refills Requested: Medication #1:  WARFARIN SODIUM 4 MG TABS Take 1 tablet by mouth as directed [facility]  Initial call taken by: Harlon Flor,  July 30, 2010 8:14 AM    Prescriptions: WARFARIN SODIUM 4 MG TABS (WARFARIN SODIUM) Take 1 tablet by mouth as directed  #45 Tablet x 0   Entered by:   Bishop Dublin, CMA   Authorized by:   Dolores Patty, MD, [facility]   Signed by:   Bishop Dublin, CMA on 07/30/2010   Method used:   [facility] to        [facility]  [phone] (retail)       [address]       [place], [place]  [address]       Ph: [phone] or [phone]       Fax: [phone]   RxID:   [id number]

## 2010-08-28 ENCOUNTER — Encounter (INDEPENDENT_AMBULATORY_CARE_PROVIDER_SITE_OTHER): Payer: Medicare Other

## 2010-08-28 ENCOUNTER — Encounter: Payer: Self-pay | Admitting: Internal Medicine

## 2010-08-28 DIAGNOSIS — I4891 Unspecified atrial fibrillation: Secondary | ICD-10-CM

## 2010-08-28 DIAGNOSIS — Z7901 Long term (current) use of anticoagulants: Secondary | ICD-10-CM

## 2010-08-28 LAB — CONVERTED CEMR LAB: POC INR: 3

## 2010-09-03 NOTE — Medication Information (Signed)
Summary: rov/ewj  Anticoagulant Therapy  Managed by: Bethena Midget, RN, BSN Referring MD: Arvilla Meres PCP: Dr Carlene Coria Supervising MD: Gala Romney MD, Reuel Boom Indication 1: Atrial Fibrillation (ICD-427.31) Lab Used: [facility] [place] Site: [name] INR POC 3.0 INR RANGE 2.0-3.0  Dietary changes: no    Health status changes: no    Bleeding/hemorrhagic complications: no    Recent/future hospitalizations: no    Any changes in medication regimen? no    Recent/future dental: no  Any missed doses?: no       Is patient compliant with meds? yes       Allergies: No Known Drug Allergies  Anticoagulation Management History:      The patient is taking warfarin and comes in today for a routine follow up visit.  Positive risk factors for bleeding include an age of 29 years or older.  The bleeding index is 'intermediate risk'.  Positive CHADS2 values include History of HTN.  Negative CHADS2 values include Age > 54 years old.  The start date was 03/16/2006.  His last INR was 2.33.  Anticoagulation responsible provider: Bensimhon MD, Reuel Boom.  INR POC: 3.0.  Cuvette Lot#: [id number].  Exp: 06/2011.    Anticoagulation Management Assessment/Plan:      The patient's current anticoagulation dose is Warfarin sodium 4 mg tabs: Take 1 tablet by mouth as directed.  The target INR is 2 - 3.  The next INR is due 09/25/2010.  Anticoagulation instructions were given to patient.  Results were reviewed/authorized by Bethena Midget, RN, BSN.  He was notified by Bethena Midget, RN, BSN.         Prior Anticoagulation Instructions: INR 2.33  Called spoke with pt advised to continue on same dosage 4mg  daily.  Recheck in 4 weeks.  Made OV in Coumadin Clinic, pt aware.   Current Anticoagulation Instructions: INR 3.0 Today only take 1/2 pill then resume 1 pill everyday. Recheck in 4 weeks.

## 2010-09-17 ENCOUNTER — Encounter: Payer: Self-pay | Admitting: Internal Medicine

## 2010-09-17 ENCOUNTER — Other Ambulatory Visit: Payer: Self-pay | Admitting: Internal Medicine

## 2010-09-17 DIAGNOSIS — I4891 Unspecified atrial fibrillation: Secondary | ICD-10-CM

## 2010-09-17 NOTE — Telephone Encounter (Signed)
Pt needs warfarin ref[facility].  There is not a reorder button next to the medication in the Meds & Orders list.

## 2010-09-18 ENCOUNTER — Other Ambulatory Visit: Payer: Self-pay | Admitting: Emergency Medicine

## 2010-09-18 MED ORDER — WARFARIN SODIUM 4 MG PO TABS: 4.0000 mg | ORAL_TABLET | ORAL | Status: DC

## 2010-09-18 NOTE — Telephone Encounter (Signed)
Script called in already/sab

## 2010-09-18 NOTE — Telephone Encounter (Signed)
Script called in pharmacy/sab

## 2010-09-25 ENCOUNTER — Ambulatory Visit (INDEPENDENT_AMBULATORY_CARE_PROVIDER_SITE_OTHER): Payer: Medicare Other | Admitting: Emergency Medicine

## 2010-09-25 DIAGNOSIS — I4891 Unspecified atrial fibrillation: Secondary | ICD-10-CM

## 2010-09-25 DIAGNOSIS — Z7901 Long term (current) use of anticoagulants: Secondary | ICD-10-CM | POA: Insufficient documentation

## 2010-09-25 NOTE — Patient Instructions (Signed)
Continue on same dosage 4mg  daily.  Recheck in 4 weeks.

## 2010-10-23 ENCOUNTER — Encounter: Payer: Medicare Other | Admitting: Emergency Medicine

## 2010-10-30 ENCOUNTER — Encounter: Payer: Medicare Other | Admitting: Emergency Medicine

## 2010-11-05 NOTE — Assessment & Plan Note (Signed)
[facility] OFFICE NOTE   Matthew, Brown                         MRN:          [id number]  DATE:10/25/2007                            DOB:          Jul 23, 1940    PRIMARY CARE PHYSICIAN:  Dr. Clydie Braun True.   NEPHROLOGIST:  Dr. Benetta Spar at the [facility].   INTERVAL HISTORY:  Matthew Brown is a delightful 70 year old male with a history  of chronic atrial fibrillation and flutter with a normal ejection  fraction by echocardiogram in 2007.  He also had a previous negative  functional study at [facility]. Further past medical history is notable  for end-stage renal disease status post kidney transplant in May 2006  now with normal creatinine, hypertension and gastroesophageal reflux  disease.   At his last visit, he had evidence of gout. We started him on  allopurinol. He has done very well with this.   Otherwise he continues to do great.  He is very active no chest pain,  shortness of breath and no further volume overload. He denies any  palpitations, syncope or presyncope.   CURRENT MEDICATIONS:  1. Lasix 40 in the morning and 20 at night.  2. Multivitamin.  3. Nexium 40 b.i.d.  4. Finasteride 5 a day.  5. Myfortic 360 b.i.d.  6. Flomax 0.4 b.i.d.  7. Coumadin.  8. Enalapril 10 a day.  9. Metoprolol 200 b.i.d.  10.Aspirin 81 a day.  11.Sensipar 15 a day.  12.Allopurinol 100 a day.   PHYSICAL EXAM:  He is well-appearing in no acute distress. He ambulates  around the clinic without any respiratory difficulty.  Blood pressure is 108/60, heart rate is 95, weight 177 which is stable.  HEENT:  Normal.  NECK:  Supple.  There is no JVD.  Carotids are 2+ bilaterally without  bruits.  There is no lymphadenopathy or thyromegaly.  CARDIAC:  PMI is nondisplaced.  He is tachycardiac and irregular. No  obvious murmurs, rubs or gallops.  LUNGS:  Clear.  ABDOMEN:  Soft, nontender, nondistended, no  hepatosplenomegaly.  No  bruits, no masses.  Good bowel sounds.  EXTREMITIES:  Warm with no cyanosis, clubbing or edema.  He has a large  AV fistula on his right arm with a good thrill.  There is no rash.  No  evidence of gout.   EKG shows a chronic atrial fibrillation at a rate of 95.  No ST-T wave  abnormalities.   ASSESSMENT/PLAN:  1. Atrial fibrillation.  This is chronic, he is on Coumadin.  His rate      is a bit faster than I would like it today. Previously he has been      in the 70s.  In talking to him more about this, he said he has not      taken his beta-blocker yet this morning as he is waiting for a lab      draw.  I did consider putting a monitor on him to assure adequate      rate control but I think it is  just due to the fact that he is not      on his beta-blocker.  I have asked him to take this as soon as he      can. We will continue current therapy.  2. Hypertension.  This is well-controlled, followed by nephrology.  3. Lipid status. This is followed by his primary care doctor. Would be      very aggressive in keeping his LDL under 100.   DISPOSITION:  Will see him back in clinic in 6 months for routine follow-  up.     Bevelyn Buckles. Bensimhon, MD  Electronically Signed    DRB/MedQ  DD: 10/25/2007  DT: 10/25/2007  Job #: [id number]   cc:   Sharman Cheek, MD  Dr. Benetta Spar

## 2010-11-05 NOTE — Assessment & Plan Note (Signed)
[facility] OFFICE NOTE   FAREED, FUNG                         MRN:          [id number]  DATE:01/11/2007                            DOB:          1941-01-15    PRIMARY CARE PHYSICIAN:  Dr. Lucienne Minks True   NEPHROLOGIST:  Dr. Einar Gip at [facility]   INTERVAL HISTORY:  Mr. Kreitzer is a delightful 70 year old male with a  history of chronic atrial fibrillation/flutter with a normal ejection  fraction by echocardiogram in August 2007.  He has also had a previous  negative functional study in [place] which was normal.  His other  past medical history is notable for end-stage renal disease now status  post kidney transplant with normal creatinine, hypertension and  gastroesophageal reflux disease.  He returns today for routine followup.  Overall, he says he is doing quite well.  He has not had any bleeding on  his Coumadin.  He denies any palpitations or cigarettes dyspnea.  He  does state that he notes that his belly has been swelling over the past  month and has had some lower extremity edema which is most worse at  night and usually goes away in the morning.  He called his transplant  coordinator who told him that as long as the edema was resolving in the  morning that it was not much to worry about.  He denies any orthopnea or  PND, does have some mild shortness of breath but does not feel that this  is changed significantly, no chest pain.   CURRENT MEDICATIONS:  1. Nexium 40 b.i.d.  2. Multivitamin.  3. Finasteride 5 mg a day.  4. Lasix 20 b.i.d.  5. Oxybutynin.  6. Myfortic 360 b.i.d.  7. Flomax 0.4 b.i.d.  8. Prograf.  9. Magnesium.  10.Coumadin.  11.Enalapril 10 a day.  12.Metoprolol 200 b.i.d.  13.Aspirin 81 a day.   PHYSICAL EXAMINATION:  He is well-appearing in no acute distress.  Ambulates around the clinic without any significant respiratory  difficulty.  Blood pressure is 110/70, heart rate  is 71, weight is 175  which is up 3 pounds from March and up about 8 pounds from last year.  HEENT:  Normal.  NECK:  Supple.  JVP is about 9 cm of water with prominent CV waves.  There is no lymphadenopathy or thyromegaly.  Carotids are 2+ bilateral  without bruits.  CARDIAC:  PMI is nondisplaced.  His heart rate is mildly irregular with  an S4.  No murmur appreciated.  LUNGS:  Clear.  ABDOMEN:  Minimally distended but nontender, soft, good bowel sounds.  No hepatosplenomegaly, no bruits, no masses appreciated.  EXTREMITIES:  Warm with no cyanosis or clubbing.  There is 1+ edema  bilaterally.  NEUROLOGIC:  He is alert and oriented x3.  Cranial nerves II-XII are  intact.  Moves all four extremities without difficulty.   EKG shows atrial fibrillation/flutter with ventricular response of 71  beats per minute.   ASSESSMENT AND PLAN:  1. Atrial fibrillation and flutter.  This is chronic.  His  rate is      well controlled.  He is relatively asymptomatic.  Continue current      therapy.  2. Lower extremity edema.  He does appear to have some volume on board      and I think he probably has a component of diastolic dysfunction.      I have asked him to increase his Lasix to 40 in the morning and      leave it at 20 at night.  We will check a BMET next week and an      echocardiogram.  I have also asked him to contact his transplant      coordinator and let them know of the change.  3. Hypertension, well controlled.   DISPOSITION:  Return to clinic in 3 months.     Bevelyn Buckles. Bensimhon, MD  Electronically Signed    DRB/MedQ  DD: 01/11/2007  DT: 01/11/2007  Job #: [id number]   cc:   Lisbeth Ply, MD  Bryna Colander, MD

## 2010-11-05 NOTE — Assessment & Plan Note (Signed)
[facility] OFFICE NOTE   CLIFTON, SAFLEY                         MRN:          [id number]  DATE:04/26/2008                            DOB:          1941-01-10    PRIMARY CARE PHYSICIAN:  Sharman Cheek, MD   NEPHROLOGIST:  Ericka Pontiff, MD, the [facility].   INTERVAL HISTORY:  Matthew Brown is a delightful 70 year old male with a history  of chronic atrial fibrillation and flutter with a normal ejection  fraction.  He also has a negative functional study in [place] in  2006.  Remainder of his medical history is notable for end-stage renal  disease status post kidney transplant in May 2006, now with normal  creatinine, hypertension, and gastroesophageal reflux disease.   He returns today for routine followup.  He is doing great.  He had a  colonoscopy yesterday when they removed several polyps.  He has been off  his Coumadin without difficulty.  He denies any chest pain.  He does  have occasional sinus congestion, but adamantly denies any exertional  dyspnea, palpitations, syncope, or presyncope.  He has not had problems  with lower extremity edema.   CURRENT MEDICATIONS:  1. Lasix 40 in the morning, 20 at night.  2. Multivitamin.  3. Nexium 40 b.i.d.  4. Finasteride 5 mg a day.  5. Myfortic 360 b.i.d.  6. Flomax 0.4 b.i.d.  7. Coumadin.  8. Enalapril 10 a day.  9. Metoprolol 200 b.i.d.  10.Aspirin 81 a day.  11.Sensipar 15 mg a day/  12.Allopurinol 100 a day.   PHYSICAL EXAMINATION:  GENERAL:  He is well-appearing in no acute  distress.  He ambulates around the clinic without respiratory  difficulty.  VITAL SIGNS:  Blood pressure is 116/64, weight is 175, heart rate is 79.  HEENT:  Normal.  NECK:  Supple.  There is no JVD.  Carotids are 2+ bilaterally without  bruits.  There is no lymphadenopathy or thyromegaly.  CARDIAC:  PMI is nondisplaced.  He is irregular.  No obvious  murmurs,  rubs or gallops.  LUNGS:  Clear.  ABDOMEN:  Soft, nontender, nondistended.  No hepatosplenomegaly.  No  bruits.  No masses.  Good bowel sounds.  EXTREMITIES:  Warm with no  cyanosis, clubbing or edema.  He has a large AV fistula on his right  forearm with a good thrill.  There is no rash.  NEURO:  Alert and oriented x3.  Cranial nerves II-XII are intact.  Moves  all 4 extremities without difficulty.  Affect is pleasant.   EKG shows AFib with minimal T-wave flattening laterally.  Ventricular  rate of 79.   ASSESSMENT AND PLAN:  1. Atrial fibrillation.  This is chronic.  He is asymptomatic.  He is      on Coumadin.  Continue current therapy.  2. Cardiovascular screening.  His last stress test was in 2006.  He      has been asymptomatic.  We will consider doing a stress test in a      year or  so for routine continued screening.  3. Hyperlipidemia.  This is followed by primary care doctor.   DISPOSITION:  We will see him back in 6 months for routine followup.     Bevelyn Buckles. Bensimhon, MD  Electronically Signed    DRB/MedQ  DD: 04/26/2008  DT: 04/26/2008  Job #: [id number]   cc:   Sharman Cheek, MD  Ericka Pontiff, MD

## 2010-11-05 NOTE — Assessment & Plan Note (Signed)
[facility] OFFICE NOTE   Matthew, Brown                         MRN:          [id number]  DATE:04/14/2007                            DOB:          02-Jul-1940    PRIMARY CARE PHYSICIAN:  Dr. Clydie Braun True.   NEPHROLOGIST:  Dr. Benetta Spar at the [facility]   INTERVAL HISTORY:  Matthew Brown is a delightful 70 year old male with a  history of chronic atrial fibrillation flutter, with a normal ejection  fraction by echocardiogram in 2007.  He also had a previous negative  function study in [place].  His other past medical history is  notable for end-stage renal disease status post kidney transplant with  normal creatinine, hypertension and gastroesophageal reflux disease.  He  returns today for routine followup.   Overall, he is doing very well.  At his last visit he had had some mild  volume overload.  We increased his Lasix gently, and it seemed to have  helped.  He still has some mild edema in his ankles at times but denies  orthopnea or PND.  He has not had any chest pain, no palpitations, no  dyspnea.  He has no problem with bleeding on the Coumadin.   CURRENT MEDICATIONS:  1. Lasix 40 in the morning and 20 at night.  2. Multivitamin.  3. Nexium 40 b.i.d.  4. Finasteride 5 a day.  5. Myfortic 360 b.i.d.  6. Flomax 0.4 b.i.d.  7. Coumadin.  8. Enalapril 10 a day.  9. Metoprolol 200 b.i.d.  10.Aspirin 81 a day.   PHYSICAL EXAMINATION:  GENERAL:  He is well-appearing, no acute  distress, ambulates around the clinic actively, without any significant  respiratory difficulty.  VITAL SIGNS:  Blood pressure is 110/80, heart rate is 76.  Weight is 176  which is stable.  HEENT:  Normal.  NECK:  Supple.  JVD is about 5 to 6 cm of water.  There is no  lymphadenopathy or thyromegaly.  Carotids are 2+ bilaterally without  bruits.  CARDIAC:  PMI is not displaced.  He is just mildly irregular with a  controlled rate.  I find no obvious murmurs, rubs or gallops.  LUNGS:  Clear.  ABDOMEN:  Soft, nontender, nondistended.  No hepatosplenomegaly, no  bruits, no masses appreciated.  Good bowel sounds.  EXTREMITIES: Warm with no cyanosis, clubbing.  There is trace edema  bilaterally.  NEUROLOGIC:  He is alert and oriented x3.  Cranial nerves 2-12 are  intact.  Moves all four extremities without difficulty.  Affect is  pleasant.  EKG shows atrial fibrillation with a ventricular response of  76 beats per minute. Minimal non-specific T-wave flattening.  There is  an occasional PVC.   ASSESSMENT/PLAN:  1. Atrial fibrillation.  He is doing well with rate control.  He will      continue his Coumadin.  2. Hypertension.  This is well controlled.  3. Hyperlipidemia.  This is followed by his primary care physician,      and he says his lipids looked great, a  goal LDL would be less than      100 for him.   DISPOSITION:  We will see him back in 4 to 6 months for routine  followup.     Bevelyn Buckles. Bensimhon, MD  Electronically Signed    DRB/MedQ  DD: 04/14/2007  DT: 04/15/2007  Job #: [id number]   cc:   Sharman Cheek, M.D.  Dr. Benetta Spar [facility]

## 2010-11-05 NOTE — Assessment & Plan Note (Signed)
[facility] OFFICE NOTE   Matthew Brown, Matthew Brown                         MRN:          [id number]  DATE:12/09/2007                            DOB:          Jun 30, 1940    PRIMARY CARE PHYSICIAN:  Dr. Clydie Braun True.   NEPHROLOGIST:  Dr. Einar Gip at the [facility].   INTERVAL HISTORY:  Prophet is a delightful 70 year old male with a history  of chronic atrial fibrillation and flutter with a normal ejection  fraction.  He also has a previous negative functional study at [facility].  Remainder of his medical history is notable for end-stage renal  disease, status post kidney transplant in May 2006, now with normal  creatinine, hypertension, and gastroesophageal reflux disease.  He has  been on Coumadin for his atrial arrhythmias.   He comes today for a Coumadin Clinic visit.  He notes that for the past  few days, he has been having problems with sinus congestion.  He has  been using Flonase and some Claritin.  Yesterday, he became somewhat  weak and dizzy, and he felt like he was really congested.  He did take  Flonase and got somewhat better.  He did not have a headache.  He did  not have any focal neurologic symptoms.  He denies any palpitations at  that time.  He did have difficulty standing due to his dizziness.   He says he feels much better.  He says he is not back to baseline, but  he has not had any further dizziness and his sinuses feel somewhat  better.  He has not had any fevers, chills, or productive sputum.   CURRENT MEDICATIONS:  1. Lasix 40 in the morning and 20 at night.  2. Multivitamin.  3. Nexium 40 b.i.d.  4. Finasteride 5 a day.  5. Myfortic 360 b.i.d.  6. Flomax 0.5 b.i.d.  7. Coumadin.  8. Enalapril 10 a day.  9. Metoprolol 200 b.i.d.  10.Aspirin 81 a day.  11.Sensipar 15 mg a day.  12.Allopurinol 100 mg daily.   PHYSICAL EXAMINATION:  He is well-appearing in no acute  distress and  ambulates around the clinic without any respiratory difficulty.  Blood  pressure is 118/70.  HEENT is normal.  Tympanic membranes are clear bilaterally.  NECK:  Neck is supple.  There is no JVD.  Carotids are 2+ bilaterally  without bruits.  There is no lymphadenopathy or thyromegaly.  CARDIAC:  PMI is nondisplaced.  He is irregular.  No obvious murmurs,  rubs, or gallops.  LUNGS:  Clear.  ABDOMEN:  Soft, nontender, and nondistended.  No hepatosplenomegaly.  No  bruits, no masses.  Good bowel sounds.  EXTREMITIES:  Warm.  No cyanosis, clubbing, or edema.  He has a large AV  fistula on his right arm with a good thrill.  There is no rash.  NEUROLOGIC:  He is alert and oriented x3.  Cranial nerves II-XII are  intact.  His finger-to-nose examination is normal.  There is no  nystagmus.  There is no pronator  drift.   INR today is 6.1.   ASSESSMENT AND PLAN:  1. Dizziness.  I suspect this is likely related to sinus disease.  He      seems to be improving.  There is no clear evidence of bacterial      sinus infection at this point, so I do not think he requires      antibiotics.  This does not seem like vertigo.  Given his elevated      INR, we did discuss the possibility of a possible intracranial      bleed, but I think this is unlikely as his symptoms now are      resolving quickly and he did not have any focal neurologic      deficits.  We have told him that we are going to follow it closely.      Should he have more symptoms, he needs to call me or his primary      care doctor immediately and I think the next step ought be to      proceed with a head CT to evaluate his sinuses as well as to rule      out any bleed or other pathology.  2. Atrial fibrillation, this is stable.  We are holding his Coumadin,      obviously, until his INR is down back to the 2-3 range.     Bevelyn Buckles. Bensimhon, MD  Electronically Signed    DRB/MedQ  DD: 12/09/2007  DT: 12/10/2007  Job #:  [id number]   cc:   Dr. Einar Gip

## 2010-11-05 NOTE — Assessment & Plan Note (Signed)
[facility] OFFICE NOTE   [name], [name]                         MRN:          [id number]  DATE:06/29/2007                            DOB:          [birth year]    PRIMARY CARE PHYSICIAN:  Dr. Clydie Braun True.   NEPHROLOGIST:  Dr. Benetta Spar at the [facility].   INTERVAL HISTORY:  [name] is a delightful 70 year old male with a  history of chronic atrial fibrillation/flutter with a normal ejection  fraction by echocardiogram in 2007.  He also has a previous negative  function study at [facility].  Further past medical history is notable  for end-stage renal disease status post kidney transplant with normal  creatinine, hypertension, and gastroesophageal reflux disease.   He returns today for routine followup.   Overall, he is doing great.  He did have some problems with volume  overload, and we adjusted his Lasix, and he feels a lot better.  He  recently had blood work drawn at [facility] and they told him everything was  perfect.  He denies any chest pain, shortness of breath, or lower  extremity edema, palpitations, or chest pain.  He has not had problems  with bleeding on Coumadin.  He does note that his right elbow has been  sore for several weeks.  He feels like this is very similar to previous  shoulder pain, which he was told was gout.   CURRENT MEDICATIONS:  1. Lasix 40 in the morning and 20 at night.  2. Multivitamin.  3. Nexium 40 b.i.d.  4. Finasteride 5 a day.  5. Myfortic 360 b.i.d.  6. Flomax 0.4 b.i.d.  7. Coumadin.  8. Enalapril 10 a day.  9. Metoprolol 200 b.i.d.  10.Aspirin 81 a day.  11.Sensipar 15 a day.   PHYSICAL EXAM:  He is well-appearing.  In no acute distress.  Ambulates  around the clinic briefly without any respiratory difficulty.  Blood pressure is 122/64, heart rate is 72, weight 176, which is stable.  HEENT:  Normal.  NECK:  Supple.  JVP is about 5 cm water.  There  is no lymphadenopathy or  thyromegaly.  Carotids are 2+ bilaterally without bruits.  CARDIAC:  PMI is nondisplaced.  He is regular with a controlled rate.  No obvious murmurs, rubs, or gallops.  LUNGS:  Clear.  ABDOMEN:  Soft and nontender.  Nondistended.  No hepatosplenomegaly.  No  bruits.  No masses.  Good bowel sounds.  EXTREMITIES:  Warm with no cyanosis, clubbing, or edema.  He has a large  AV fistula in his right wrist with a good thrill.  Right elbow is mildly  swollen, but not erythematous.  There is no significant calor.  No rash.   ASSESSMENT AND PLAN:  1. Atrial fibrillation.  This is chronic.  He is well rate-controlled.      He is on Coumadin.  He is doing well.  Continue current therapy.  2. Probable right elbow gout.  Given his history of renal transplant,      I am hesitant to  use any NSAID.  Thus, we will use a burst of      prednisone 40 mg a day for 3 days.  We will draw a uric acid.  If      that is elevated, we will start him on allopurinol 100 a day and he      can follow up with his primary care physician.  3. Hypertension.  This is followed by nephrology.  It is well      controlled.   DISPOSITION:  We will see him back in 4 months for routine followup.     Bevelyn Buckles. Bensimhon, MD  Electronically Signed    DRB/MedQ  DD: 06/29/2007  DT: 06/29/2007  Job #: [id number]   cc:   Dr. Benetta Spar

## 2010-11-08 NOTE — Discharge Summary (Signed)
Matthew Brown, Matthew Brown                  ACCOUNT NO.:  [id number]   MEDICAL RECORD NO.:  [id number]          PATIENT TYPE:  INP   LOCATION:  1408                         FACILITY:  [facility]   PHYSICIAN:  Deidre Ala, M.D.    DATE OF BIRTH:  [birth year]   DATE OF ADMISSION:  02/12/2006  DATE OF DISCHARGE:  02/16/2006                                 DISCHARGE SUMMARY   ADMISSION DIAGNOSES:  1. End-stage osteoarthritis of the right knee.  2. History of kidney transplant.  3. Hypertension.  4. Gastroesophageal reflux disease.   DISCHARGE DIAGNOSES:  1. End-stage osteoarthritis of the right knee status post right total knee      arthroplasty.  2. History of kidney transplant.  3. Hypertension.  4. Gastroesophageal reflux disease.  5. Postoperative hemorrhagic anemia, stable at the time of discharge.   PROCEDURE:  The patient was taken to the operating room on February 12, 2006  and underwent right total knee arthroplasty, DePuy LCS rotating platform.  Surgeon was Dr. Deidre Ala.  Assistant was [employer], P.A.-C.  Surgery was done under general anesthesia with femoral nerve block and a  Hemovac drain x1 was placed at the time of surgery.   CONSULTATIONS:  1. Physical therapy.  2. Occupational therapy.  3. [facility] with Dr. Antoine Poche.  4. Social work.  5. Case management.   BRIEF HISTORY:  The patient is a 70 year old male with a history of right  knee pain.  He has previously undergone a right knee arthroscopy and  __________ injections prior to this.  The conservative measures have failed  up to this point.  Radiographs in the office revealed end-stage  osteoarthritis of the right knee.  Upon these findings and failure of  conservative treatment, Dr. Renae Fickle felt it was best to proceed with the right  total knee arthroplasty.  The patient agreed.  The risks and benefits of the  surgery were discussed with the patient and patient wished to proceed.   LABORATORY DATA:  CBC  on admission showed a hemoglobin of 13.5, hematocrit  40.6, white blood cell count 4.4, red blood cell count 4.81.  Serum H&H were  followed throughout hospital stay.  Hemoglobin and hematocrit did decline to  10.3 and 29.7 on February 15, 2006 but was stable at the time of discharge.  Differential on admission showed neutrophils high at 80, lymphocytes low at  8.  Coagulation studies on admission were all within normal limits.  PT and  INR at the time of discharge were 24.0 and 2.0, respectively, on Coumadin  therapy.  Routine chemistry on admission showed glucose high at 120.  Serum  chemistries were followed throughout hospital stay.  Sodium did decline to  132 on February 15, 2006 but was stable at the time of discharge.  Glucose  ranged from a low of 109 to a high of 149.  Urinalysis on admission showed  30 mg/dL of protein and a trace amount of leukocyte esterase.  The patient's  blood type was O+ with antibody screen negative.  EKG on admission showed  normal sinus rhythm with left axis deviation and nonspecific T wave  abnormality.  He did convert to atrial flutter and atrial fibrillation  during surgery which has been shown by postoperative EKG.   HOSPITAL COURSE:  The patient was admitted to [facility] and taken  to the operating room.  He underwent the above-stated procedure without  complications.  The patient tolerated the procedure well and allowed to  return to the telemetry floor to continue postoperative care.  On the day of  surgery, he was seen by cardiology due to his conversion into atrial  fibrillation/atrial flutter during surgery and was followed closely by the  cardiologist who has set him up for an outpatient appointment.  Orthopedically, on postoperative day #1, the patient was resting  comfortably.  Hemoglobin and hematocrit were 11.4 and 34.4.  He is  neurovascularly intact to the right lower extremity.  Dressing was clean,  dry and intact.  The patient  was alert with physical therapy and  occupational therapy.  On postoperative day #2, the patient was doing well,  minimal complaints.  Hemoglobin and hematocrit were 10.6 and 31.6.  Dressing  was changed.  Incision was clean, dry and intact.  Hemovac drain was  discontinued.  PCA was discontinued as well and patient was saline locked.  On postoperative day #3, the patient was complaining of some low back pain.  Otherwise, his right knee was doing well.  Hemoglobin and hematocrit were  10.3 and 29.7.  He was to continue working with physical therapy and  occupational therapy for discharge home the following day.  On February 16, 2006, postoperative day #4, the patient was doing well and was ready for  discharge.  T-max was 99.1.  Incision was clean, dry and intact.  He was  neurovascularly intact to the right lower extremity.  The patient was  subsequently discharged home on this day.   DISPOSITION:  The patient was discharged home on February 16, 2006.   DISCHARGE MEDICATIONS:  1. __________ 5/325 mg, 1-2 p.o. q.4-6h. p.r.n. pain.  2. Robaxin 500 mg, 1 p.o. q.6-8h. p.r.n. spasm.  3. Coumadin per pharmacy protocol.  4. Colace 100 mg, 1 p.o. b.i.d.  5. Senokot over-the-counter.   DIET:  The patient is to resume his previous home regular diet.   ACTIVITY:  The patient is weight-bearing as tolerated to the right lower  extremity.   WOUND CARE:  The patient is to perform daily dressing changes until no  drainage.  He may shower when no drainage.   FOLLOWUP:  The patient is to follow up with Dr. Renae Fickle two weeks from the date  of surgery.  The office is to be called for an appointment at [phone].  The  patient is to follow up with Dr. Dietrich Pates at [facility] on  March 12, 2006 at 2 p.m.  Phone number is [phone].   CONDITION ON DISCHARGE:  Stable and improved.     ______________________________  Clarene Reamer, P.A.-C.   ______________________________  V. Charlesetta Shanks,  M.D.    SW/MEDQ  D:  02/16/2006  T:  02/16/2006  Job:  [id number]

## 2010-11-08 NOTE — Assessment & Plan Note (Signed)
[facility] OFFICE NOTE   Matthew Brown, Matthew Brown                         MRN:          [id number]  DATE:07/07/2006                            DOB:          08-Oct-1940    IDENTIFICATION:  Matthew Brown is a delightful 70 year old male who returns  for routine followup.   PROBLEM LIST:  1. Chronic atrial fibrillation.      a.     Echocardiogram August 2007 EF of 65-70%, trivial mitral       regurgitation.      b.     Functional study in [facility], no ischemia, normal       ejection fraction.  2. History of end stage renal disease, now status post kidney      transplant with normal creatinine.  3. Hypertension.  4. Gastroesophageal reflux disease.   CURRENT MEDICATIONS:  1. Multivitamin.  2. Nexium 40 b.i.d.  3. Finasteride 5 a day.  4. Lasix 20 b.i.d.  5. Oxybutynin 10 a day.  6. Myfortic 360 a day.  7. Flomax 0.4 a day.  8. Prograf 4 mg b.i.d.  9. Magnesium.  10.Coumadin as directed.  11.Enalapril 10 a day.  12.Metoprolol 200 b.i.d.  13.Aspirin 81.   INTERVAL HISTORY:  Matthew Brown returns today for routine followup.  He is  doing great.  He is staying very active, without any chest pain or  shortness of breath.  He denies any palpitations, no syncope or  presyncope, no heart failure symptoms.  Blood pressure has been better  controlled after we increased his beta blocker at the last visit.   PHYSICAL EXAMINATION:  He walks around the clinic without any  respiratory difficulty.  Blood pressure is 127/56, heart rate is 98.  His weight is 171.  HEENT:  Sclerae are anicteric.  EOMi.  No xanthelasmas.  Mucous  membranes are moist.  Oropharynx is clear.  NECK:  Supple, no JVD.  Carotids are 2+ bilaterally, no bruits.  There  is no lymphadenopathy or thyromegaly.  CARDIAC:  He is regular, with no murmurs, rubs or gallops.  LUNGS:  Clear.  ABDOMEN:  Soft, nontender, nondistended.  No hepatosplenomegaly, no  bruits, no masses, good bowel sounds.  EXTREMITIES:  Warm, with no cyanosis, clubbing or edema.  He does have a  large AV fistula in his right wrist with a good thrill.  NEURO:  He is alert and oriented x3.  Cranial nerves II-XII intact.  Moves all 4 extremities without difficulty.  Affect is very bright.   EKG shows either a sinus rhythm with a marked prolonged AV block or a  accelerated junctional with retrograde P-waves.  There is nonspecific ST  wave changes.  This is different from his previous EKGs which showed  chronic atrial fibrillation.  He did have a monitor which showed chronic  atrial fibrillation with mean heart rate of 74.   ASSESSMENT AND PLAN:  1. Atrial fibrillation, chronic.  This is stable, with good rate      control.  Continue current therapy.  2. Cholesterol.  Most recent lipid panel shows total cholesterol 168,      triglycerides  111, HDL 47, LDL 99.  This is at goal for him.      Continue current therapy.  3. Hypertension.  Well controlled.  4. Accelerated junctional versus sinus with marked first degree AV      block.  This is stable.  Continue current therapy.  No need for a      pacemaker at this time.     Bevelyn Buckles. Bensimhon, MD  Electronically Signed    DRB/MedQ  DD: 07/07/2006  DT: 07/07/2006  Job #: [id number]

## 2010-11-08 NOTE — Op Note (Signed)
NAME:  Matthew Brown, Matthew NO.:  [id number]   MEDICAL RECORD NO.:  [id number]          PATIENT TYPE:  INP   LOCATION:  0005                         FACILITY:  [facility]   PHYSICIAN:  Deidre Ala, M.D.    DATE OF BIRTH:  [birth year]   DATE OF PROCEDURE:  02/12/2006  DATE OF DISCHARGE:                                 OPERATIVE REPORT   PREOPERATIVE DIAGNOSIS:  End-stage degenerative joint disease, right knee.   POSTOPERATIVE DIAGNOSIS:  End-stage degenerative joint disease, right knee.   PROCEDURE:  Right total knee arthroplasty using cemented DePuy components,  rotating platform LCS type.   SURGEON:  1. Charlesetta Shanks, M.D.   ASSISTANTS:  Clarene Reamer, P.A.-C., and Benny Lennert, P.A.-C.   ANESTHESIA:  General with LMA with femoral nerve block.   CULTURES:  None.   DRAINS:  Two medium Hemovacs to Autovac.   ESTIMATED BLOOD LOSS:  Less than 100 mL.  Replaced:  Without.   TOURNIQUET TIME:  1 hour 1 minute.   PATHOLOGIC FINDINGS AND HISTORY:  Matthew Brown is a 70 year old successful renal  transplant patient, who has had progressive problems with right knee pain.  He was taken to the operating room April 22, 2005, for a knee arthroscopy.  Tricompartment disease was found.  He continued to have on and off pain in  the postoperative period and was injected with Supartz injections but  finally the pain just was bad enough that he elected to proceed with  arthroplasty.  At surgery tricompartment disease was noted.  We ended up  fitting him with a standard plus right femur, a #4 tibial keeled tray, an  oval dome patella three-peg 38 mm, a standard 10 mm rotating platform  insert.  We used two batches of cement with 750 mg of Zinacef per batch.  We  achieved full extension, flexion to about 120 degrees, stable to varus-  valgus stressing, and good patellar tracking.  The oval dome patella three-  peg was 38 mm.   PROCEDURE:  With adequate anesthesia obtained using LMA  technique and  femoral nerve block, the patient was placed in the supine position.  The  right lower extremity was prepped from the toes to the tourniquet in  standard fashion.  Under standard prepping and draping, Esmarch  exsanguination was used.  The tourniquet was let up to 350 mmHg.  A median  parapatellar skin incision was followed by a median parapatellar retinacular  incision.  The incision was deepened sharply with the knife and hemostasis  obtained using the Bovie electrocoagulator.  The patella was everted, the  fat pad excised, as well as both menisci and the cruciates.  We then  amputated the tibial spine with a saw and placed the extramedullary tibial  cutting jig in place.  We then made our first tibial cut with the  appropriate alignment.  We then sized the femur to a standard plus using the  anterior-posterior cutting jig with the intramedullary guide, put it in  place.  We then placed a C-clamp, felt it to be a bit tight, cut  5 mm more  of the tibia, then fit a 12.5 C-clamp.  We then pinned it and made the  anterior-posterior cut.  The lollipop spacer in flexion then was 10 mm.  We  then placed a 4 degree valgus cutting jig in place on the right and made  that cut at the first setting, and this fit the 10 mm into full extension.  We then balanced equally both ligaments with 10 mm in flexion and extension.  We then placed the finishing guide on the distal femur, made the anterior-  posterior and chamfer cuts as well as the notch cut.  We then sized the  tibia to a 4, made the central peg hole, and then broached for the keel.  We  then trialled off this the 10 mm rotating platform.  We placed on the  femoral component, standard plus, and articulated the knee through a full  range of motion.  We then measured the patella at 25, cut it down to a 15,  and then we placed the three-peg guide in place, made those holes, and put  the trial patella on and articulated it through a  range of motion.  All  trial components were then removed while we thoroughly jet-lavaged the knee.  Cement was mixed with Zinacef and the cement gun and the components were  checked for sizing as they came on the field.  We then exposed the proximal  tibia, cemented on the tibial components, impacted it, and removed excess  cement.  We then placed the rotating platform.  We then cemented on the  femoral component, impacted it and removed the excess cement.  We then held  the knee in full extension, further expressing cement, and we removed the  excess.  We then held the knee in 45 degrees of flexion while we cemented on  the patellar component, impacted it, removed excess cement, and held it with  a clamp until the cement had cured.  We then let the tourniquet down.  Additional irrigation and jet lavage was carried out and bleeding points  were cauterized.  We then placed Hemovac drains in the medial and lateral  gutter and brought out the superior lateral portal.  The wound was then  closed in layers with #1 interrupted figure-of-eight sutures on the  retinaculum with #1 Vicryl, oversewed running, locking #1 PDS.  Vicryl 0, 2-  0 and 3-0 was then used on the subcu, primarily 0 and 3-0.  Skin staples  were then placed.  We then hooked up the Hemovac to Autovac.  A bulky  sterile compressive dressing was applied with knee immobilizer.  The patient  then, having tolerated the procedure well, was awakened and taken to the  recovery room in satisfactory condition for routine postoperative care and  CPM.           ______________________________  V. Charlesetta Shanks, M.D.     VEP/MEDQ  D:  02/12/2006  T:  02/13/2006  Job:  [id number]   cc:   [facility]   Wilber Bihari. Caryn Section, M.D.  Fax: [phone]   Renal Transplant Team  [facility]   Paulino Rily, M.D.  Transplant Nephrology  [facility]  [address]  [place], [place] [address] Valinda Hoar [phone]

## 2010-11-08 NOTE — Assessment & Plan Note (Signed)
[facility]                                 ON-CALL NOTE   PERL, KERNEY                           MRN:          [id number]  DATE:09/19/2006                            DOB:          December 28, 1940    PRIMARY CARDIOLOGIST:  Dr. Nicholes Mango.   CHIEF COMPLAINT:  Out of Coumadin.   Telephone contact note dated September 19, 2006.   [name] called and stated that he was out of his Coumadin.  He stated  that he had taken his last pill yesterday and yesterday during the day  he had called the office wanting to know what to do and had not gotten a  response.  I requested him to give me the name of his pharmacy and he  did so.  I called in a prescription for Coumadin.  [name] states  that he takes 5 mg a day and I gave him some refills as well.  He states  that he has an appointment to get his Coumadin checked at our Coumadin  clinic and he stated he would keep his followup appointment.  He denied  any symptoms of bleeding or any other issues or problems.  [name]  stated that he had no other needs at this time.      Theodore Demark, PA-C  Electronically Signed      Luis Abed, MD, [facility]  Electronically Signed   RB/MedQ  DD: 09/20/2006  DT: 09/20/2006  Job #: [id number]

## 2010-11-08 NOTE — Assessment & Plan Note (Signed)
[facility] OFFICE NOTE   Matthew Brown, Matthew Brown                         MRN:          [id number]  DATE:08/26/2006                            DOB:          01-Mar-1941    PRIMARY CARE PHYSICIANS:  1. Sharman Cheek, M.D.  2. Dr. Alcario Drought, [facility].   PATIENT IDENTIFICATION:  Mr.  Matthew Brown is a delightful 70 year old male  who returns for routine follow-up.   PROBLEM LIST:  1. Chronic atrial fibrillation/flutter.      a.     Echocardiogram  August 2007, ejection fraction 65-70% with       trivial mitral regurgitation.      b.     Functional study in [facility], no ischemia, normal       ejection fraction.  2. History of end-stage renal disease, now status post kidney      transplant with normal creatinine.  3. Hypertension.  4. Gastroesophageal reflux disease.   CURRENT MEDICATIONS:  1. Multivitamin.  2. Nexium 40 b.i.d.  3. Finasteride 5 a day.  4. Furosemide 20 b.i.d.  5. Oxybutynin 10 a day.  6. Myfortic 360 b.i.d.  7. Flomax 0.4 a day.  8. Prograf 4 mg b.i.d.  9. Senokot.  10.Magnesium.  11.Coumadin.  12.Enalapril 10 a day.  13.Metoprolol 200 b.i.d.  14.Aspirin 81 a day.   INTERVAL HISTORY:  [name] returns today for routine follow-up.  He  is doing great.  He denies any chest pain or shortness of breath.  He is  able to do all his activities without any problems.  He does have  occasional palpitations when he lies in bed at night but has not had any  syncope or presyncope.  He is asking whether or not how long he will be  on Coumadin and if this is at all related to his previous knee surgery  as it appears he had postoperative atrial fibrillation and flutter.   PHYSICAL EXAMINATION:  GENERAL APPEARANCE:  He is well-appearing in no  acute distress.  Ambulates around the clinic without any respiratory  difficulty.  VITAL SIGNS:  Blood pressure 128/58 with a heart rate of 88, weight 172.  HEENT:  Sclerae are anicteric.  EOMI.  There is no xanthelasma.  Moist  mucous membranes.  NECK:  Supple with no JVD.  Carotids are 2+ bilaterally without any  bruits.  There is no lymphadenopathy or thyromegaly.  LUNGS:  Clear.  CARDIOVASCULAR:  Regular rate and rhythm with no murmurs, rubs, or  gallops.  ABDOMEN:  Soft, nontender, nondistended, no hepatosplenomegaly, no  bruits, no masses.  Good bowel sounds.  EXTREMITIES:  Warm with no clubbing, cyanosis, or edema.  He has a large  AV fistula on his right wrist with a good thrill.  NEUROLOGIC:  He is alert and oriented x3.  Cranial nerves II-XII grossly  intact.  Moves all four extremities without difficulty.  Affect is  bright.   EKG shows atrial flutter with ventricular response of 80 beats per  minute.  Of note, previous EKG here in  the clinic was sinus rhythm with  prolonged first degree AV block.   ASSESSMENT/PLAN:  Paroxysmal atrial fibrillation/flutter.  We did have a  long talk about the options including continued rate control therapy  with Coumadin or perhaps proceeding with possible electrophysiology  evaluation with consideration of atrial flutter ablation.  At this  point, he feels well and is not overly troubled by the Coumadin.  Would  like to continue with medical therapy.  We will see him back in six  months for routine follow-up.     Bevelyn Buckles. Bensimhon, MD  Electronically Signed    DRB/MedQ  DD: 08/26/2006  DT: 08/26/2006  Job #: [id number]   cc:   Sharman Cheek, M.D.

## 2010-11-08 NOTE — Consult Note (Signed)
NAME:  Matthew, Brown NO.:  [id number]   MEDICAL RECORD NO.:  [id number]          PATIENT TYPE:  INP   LOCATION:  1408                         FACILITY:  [facility]   PHYSICIAN:  Pricilla Riffle, MD, FACCDATE OF BIRTH:  30-Sep-1940   DATE OF CONSULTATION:  02/12/2006  DATE OF DISCHARGE:                                   CONSULTATION   IDENTIFICATION:  Matthew Brown is a 70 year old gentleman who we are asked to  see regarding atrial fibrillation.   HISTORY OF PRESENT ILLNESS:  The patient has no known history of atrial  fibrillation.  He notes in the past prior to transplant he had heart racing,  usually after dialysis.  Seen in the emergency room in [facility] and  nothing found.  He has not had this x1 year.  He denies chest pain, no  shortness of breath, no palpitations.   Today, admitted for knee surgery and in the OR was found to have irregular  rhythm, question atrial fibrillation versus atrial flutter.   ALLERGIES:  None.   MEDICATIONS AT HOME:  1. Prograf four b.i.d.  2. Myfortic 300 b.i.d.  3. Aspirin 81 mg daily.  4. Fosamax 35 weekly.  5. Oxybutynin 10 daily.  6. Magnesium oxide 240 b.i.d.  7. Proscar 5 daily.  8. Enalapril 5 daily.  9. Nexium 40 daily.  10.Flomax 0.4 b.i.d.  11.Lopressor 100 b.i.d.  12.Methasone inhaler b.i.d.  13.Multivitamin.   PAST MEDICAL HISTORY:  1. DJD status post right knee arthroplasty in the past, right knee      replacement today.  2. End-stage renal disease status post renal transplant Nov 04, 2004.  3. Hypertension.  4. GE reflux.  5. BPH.  6. Negative Myoview 2006 per patient [facility]).   SOCIAL HISTORY:  The patient lives in [place] with his lady friend.  He  is a retired Visual merchandiser, divorced.  He has a 20 pack-year history of smoking,  quit in 1980s.  Drinks occasionally.  Does not exercise much secondary to  DJD.   FAMILY HISTORY:  Mother died of throat cancer in her 65s.  Father died of a  brain tumor  age 46.  Six brothers and sisters with diabetes and coronary  disease.   REVIEW OF SYSTEMS:  All systems reviewed.  Negative to the above problem  except as noted.  Note the patient had an echocardiogram at [facility]  prior to kidney transplant.   PHYSICAL EXAMINATION:  GENERAL:  The patient is in no acute distress.  Denies shortness of breath.  VITAL SIGNS:  Blood pressure 125/55, pulse is 85 and irregular, temperature  35.4, O2 saturation on 2 L 100%.  HEENT:  Normocephalic, atraumatic.  PERRL.  NECK:  No bruits, JVP is normal.  CARDIAC:  Irregularly irregular, S1, S2.  No S3, no murmurs.  ABDOMEN:  Benign, no hepatosplenomegaly.  LUNGS:  Clear to auscultation anteriorly.  EXTREMITIES:  Status post right TKA.  Left lower extremity 2+ pulses, no  edema.  NEUROLOGIC:  Alert and oriented x3.  Cranial nerves II-XII grossly intact.  Moving all extremities.   Chest x-ray:  Elevated right hemidiaphragm; bronchitic changes, chronic.  A  12-lead EKG unable to find in records.  Telemetry with atrial fibrillation,  rate in the 80s.  A 12-lead EKG from October 2006 shows normal sinus rhythm.  Labs significant for a hemoglobin of 13.5, WBC of 4.4.  BUN and creatinine  of 20 and 1.5, potassium of 4.8.  INR of 1.1.   IMPRESSION:  The patient is a 70 year old with hypertension, renal  transplant, now found to be in atrial fibrillation, rate controlled; he is  asymptomatic.  No prior EKGs to show this.   Would recommend checking thyroid.  The patient is hemodynamically stable.  Would continue to observe heart rate on telemetry.  Continue on current dose  of Lopressor.  Check echocardiogram.   Would, with his age and hypertension, recommend chronic Coumadin therapy.  Again, this can be followed up as an outpatient.  In 4 weeks could try  cardioversion though, again, how long this will last will need to be  followed.  Discussed this with the patient and friend.  Begin Coumadin when  okay  from surgical standpoint.           ______________________________  Pricilla Riffle, MD, [facility]     PVR/MEDQ  D:  02/12/2006  T:  02/13/2006  Job:  [id number]

## 2010-11-08 NOTE — Assessment & Plan Note (Signed)
[facility] OFFICE NOTE   MALE, Matthew Brown                           MRN:          [id number]  DATE:05/18/2006                            DOB:          April 07, 1941    PATIENT IDENTIFICATION:  Matthew Brown is a delightful 70 year old male who  presents to establish long-term care.   PROBLEM LIST:  1. Chronic atrial fibrillation.      a.     Status post failed cardioversion April 27, 2006.      b.     Echocardiogram February 13, 2006 showed EF 65 to 70% with trivial       mitral regurgitation.      c.     Previous functional study Myoview in [facility] 2006 showed       normal ejection fraction with no ischemia.  2. History of end-stage renal disease now status post kidney transplant      with normal creatinine.  3. Hypertension.  4. Status post right knee replacement February 12, 2006.  5. Gastroesophageal reflux disease.  6. Benign prostatic hypertrophy.   CURRENT MEDICATIONS:  1. Multivitamin one a day.  2. Nexium 40 b.i.d.  3. Finasteride 5 mg daily.  4. Lasix 20 b.i.d.  5. Oxybutynin 10 at day.  6. Myfortic 360 b.i.d.  7. Flomax 0.4 b.i.d.  8. Prograf 4 mg b.i.d.  9. Enalapril 10 mg b.i.d.  10.Metoprolol ER 150 b.i.d.  11.Magnesium 400 b.i.d.  12.Coumadin.   INTERVAL HISTORY:  Matthew Brown returns today to establish long-term care. He  apparently developed atrial fibrillation postop after his knee replacement.  He underwent attempt at cardioversion on November 5 which apparently was  successful. He then reverted to atrial fibrillation after this. Decision was  made to proceed with rate control which he has done well with. He saw Tereso Newcomer, PA-C here in clinic on November 9 who increased his Toprol to 150 mg  b.i.d. Followup CardioNet monitor, however, showed persistent atrial  fibrillation with a mean heart rate of 98 with range of 68 to 112 beats per  minute. He denies any further  palpitations. He has not had chest pain or  shortness of breath. No lower extremity edema, no orthopnea, no PND, no  syncope or presyncope. He has been compliant with his medications. Has not  had any problems with bleeding.   PHYSICAL EXAMINATION:  GENERAL:  He is well-appearing. He ambulates around  the clinic without any respiratory distress.  VITAL SIGNS:  Blood pressure 132/80, heart rate 179, weight 168.  HEENT:  Sclerae anicteric. EOMI. There is no xanthelasmas. Mucous membranes  are moist. Oropharynx is clear.  NECK:  Supple. There is no JVD. Carotids are 2+ bilaterally without any  bruits. There is no lymphadenopathy or thyromegaly.  CARDIAC:  He is irregular irregular with no murmurs, rubs or gallops.  LUNGS:  With decreased breath sounds at the bases; otherwise, clear.  ABDOMEN:  Sound, nontender, nondistended. No hepatosplenomegaly, no bruits,  no masses. Good bowel sounds.  EXTREMITIES:  Warm with  no cyanosis, clubbing or edema. He does have a large  fistula in his right wrist with a good thrill.  NEUROLOGICAL:  He is alert and oriented x3. Cranial nerves II-XII are  intact. Moves all four extremities without difficulty.   EKG shows what appears to be atrial flutter with variable AV conduction with  ventricular rate of 79 beats per minute. There are no ST-T wave changes.   ASSESSMENT/PLAN:  1. Atrial fibrillation/flutter. He has failed cardioversion. Now we have      decided to proceed with a rate control strateg. Based on his last      monitor his ventricular response is still a little bit elevated though      he is asymptomatic. We will increase his metoprolol to 200 mg b.i.d. He      has plenty of blood pressure room to tolerate this. Should he develop      problems with fatigue, we could consider adding a low dose of calcium      channel blocker or digoxin. I will see him in two months  to further      evaluate. We will put another monitor on him at the end of the  week to      make sure he is not having any pauses and his heart rate control is      adequate.  2. Hypertension suboptimally controlled. We are increasing his beta      blocker. He will follow with nephrology.   PRIMARY PREVENTION OF CARDIAC DISEASE:  I have asked him to restart a baby  aspirin in addition to his Coumadin for cardio protection. We could also  consider adding a statin, but I will leave this to the discretion of his  primary care physician.     Matthew Buckles. Bensimhon, MD  Electronically Signed    DRB/MedQ  DD: 05/18/2006  DT: 05/18/2006  Job #: [id number]   cc:   Lisbeth Ply

## 2010-11-08 NOTE — Assessment & Plan Note (Signed)
[facility]                              CARDIOLOGY OFFICE NOTE   JOSHUAH, MINELLA                         MRN:          [id number]  DATE:04/21/2006                            DOB:          [birth year]    Mr. Steck is a very pleasant 70 year old African American male patient who  initially was found to be in atrial fibrillation on February 12, 2006, after  undergoing knee surgery.  Prior to admission he had a Myoview scan done in  [place] in 2006, that showed no ischemia.  He also has a history of  hypertension, end stage renal disease status post renal transplant and  gastroesophageal reflux.  The patient was placed on Coumadin and metoprolol  and has done well.  An echocardiogram on February 13, 2006, showed normal LV  function, ejection fraction 65-70%.  The left atrium is mild to moderately  dilated at 45.  Estimated PAP was 49-mmHg.  The patient has done quite well.  He denies any chest pain, palpitations, dyspnea, dyspnea on exertion,  dizziness, or presyncope and is totally asymptomatic with the atrial  fibrillation.  His INR has been therapeutic since October 2nd, at which time  it was 2.7; October 9th it was 3.2; October 18th it was 2; October 25th it  was at 2.6 and today's is pending.   ALLERGIES:  No known drug allergies.   CURRENT MEDICATIONS:  1. Multivitamin daily.  2. Nexium 40 mg b.i.d.  3. Finasteride 5 mg daily.  4. Furosemide 20 mg b.i.d.  5. Oxybutynin 10 mg every day.  6. Myfortic 360 b.i.d.  7. Flomax 0.4 mg b.i.d.  8. Prograf 1 mg, four tablets b.i.d.  9. Enalapril 5 mg daily.  10.Metoprolol 100 mg b.i.d.  11.Senokot b.i.d.  12.Magnesium 400 mg b.i.d.  13.Coumadin.   PAST MEDICAL HISTORY:  1. Status post renal transplant for end-stage renal disease.  2. History of hypertension.  3. Gastroesophageal reflux disease.  4. Status post right knee arthroplasty in August 2007.  5. BPH.   SOCIAL HISTORY:  The patient  lives in [place] with his lady friend.  He  is a retired Visual merchandiser, divorced.  A 20-pack year history of smoking, quit in  the 1980s.  Occasional alcohol.   FAMILY HISTORY:  Mother died of throat cancer in her 62s.  Father died of  brain tumor at age 4.  Six brothers and sisters with diabetes and coronary  disease.   REVIEW OF SYSTEMS:  Negative for dizziness or presyncopal signs or symptoms,  dyspepsia, dysphagia, nausea, vomiting, changes in bowels or melena.  Cardiopulmonary, please see HPI.   PHYSICAL EXAMINATION:  GENERAL:  This is a very pleasant   PHYSICAL EXAMINATION:  GENERAL:  This is 70 year old African American male  in no acute distress.  VITAL SIGNS:  Blood pressure 141/88, pulse 82, weight 167.  NECK:  Without JVD, __________  , bruit, or thyroid enlargement.  LUNGS:  Clear anterior, posterior, and lateral.  HEART:  Irregular at 70 beats per minute, normal S1 and S2.  A 1 to  2 over 6  systolic murmur at the apex.  ABDOMEN:  Soft without organomegaly, mass, lesions, or abdominal tenderness.  EXTREMITIES:  Without cyanosis, clubbing, or edema.  He has good distal  pulses.  NEUROLOGIC:  Without focal deficit.   EKG:  Atrial fibrillation versus some type of atrial tachycardia.   IMPRESSION:  1. Atrial fibrillation at least since February 12, 2006.  2. Coumadin therapy.  INR is therapeutic since March 24, 2006.  3. Status post right knee replacement on February 12, 2006.  4. History of kidney transplant for end-stage renal disease.  5. Hypertension.  6. Gastroesophageal reflux disease.  7. Benign prostatic hypertrophy.   PLAN:  At this time we will check an INR today and if this is therapeutic,  we will schedule him for an elective cardioversion with Dr. Tenny Craw in the  hospital on April 27, 2006.  We will also check his INR prior to  undergoing this.  The patient is agreeable and wants to proceed.   Dr. Diona Browner did review the patient's chart and is agreeable with  the plan.     ______________________________  Jacolyn Reedy, PA-C    ______________________________  Jonelle Sidle, MD   ML/MedQ  DD: 04/21/2006  DT: 04/21/2006  Job #: [id number]

## 2010-11-08 NOTE — Assessment & Plan Note (Signed)
[facility]                              CARDIOLOGY OFFICE NOTE   NAME:Matthew Brown, Matthew Brown                         MRN:          [id number]  DATE:03/31/2006                            DOB:          [birth year]    This is a 70 year old African-American male patient, who was recently  started on Coumadin for atrial fibrillation, which he was found to have  during a total knee replacement.  He presents today because of right ankle  skin changes and discomfort and is worried it is coming from the Coumadin.   The patient states the right ankle actually has been hurting him since his  knee replacement and has been a little bit tender, and the skin has become a  little bit darker than his other surrounding area, and he was worried that  it was from the Coumadin.   CURRENT MEDICATIONS:  1. Multivitamin daily.  2. Nexium 40 mg b.i.d.  3. Finasteride 5 mg daily.  4. Lasix 20 mg b.i.d.  5. Oxybutynin 10 mg daily.  6. __________ 360 mg b.i.d.  7. Flomax 0.4 b.i.d.  8. Prograf 1 mg four b.i.d.  9. Enalapril 5 mg daily.  10.Metoprolol 100 b.i.d.  11.Senokot b.i.d.  12.Magnesium 400 mg b.i.d.  13.Coumadin as directed.  INRs have been therapeutic for the past two      weeks.   PHYSICAL EXAMINATION:  VITAL SIGNS:  Blood pressure 138/72, pulse 72, weight  168.  NECK:  Without JVD or HJR.  LUNGS:  Clear.  HEART:  Irregularly irregular at 72 beats per minute with significant  murmur.  EXTREMITIES:  The right knee is swollen.  Right ankle:  There is no edema.  He says it is slightly tender to palpation.  There is no hematoma, and  discoloration is very insignificant.  He has good distal pulses.   IMPRESSION:  1. Right ankle tenderness, question secondary to right knee surgery.  2. Atrial fibrillation, question duration, found while in the operating      room for knee surgery.  3. Coumadin therapy.  4. Negative Myoview scan in 2006 in [place].  5.  Hypertension.  6. End-stage renal disease, status post renal transplant.  7. Gastroesophageal reflux.   PLAN AT THIS TIME:  I told him to mention this to Dr. Renae Fickle when he sees him.  I do not feel it is due to the Coumadin, and he will see Dr. Tenny Craw back in  followup to decide on cardioversion once his INR is therapeutic for four  weeks.      ______________________________  Jacolyn Reedy, PA-C    ______________________________  Bevelyn Buckles. Bensimhon, MD    ML/MedQ  DD:  03/31/2006  DT:  04/01/2006  Job #:  [id number]

## 2010-11-08 NOTE — Assessment & Plan Note (Signed)
[facility]                              CARDIOLOGY OFFICE NOTE   ZALAN, SHIDLER                         MRN:          [id number]  DATE:05/01/2006                            DOB:          December 04, 1940    HISTORY OF PRESENT ILLNESS:  [name] is a 70 year old patient, followed  by Dr. Tenny Craw, with the history of atrial fibrillation.  This was noted after  undergoing right total knee replacement, February 12, 2006.  Prior to this, he  had a Myoview scan done at [facility] that showed no ischemia.  He was  maintained on Coumadin therapy and was set up for elective cardioversion on  April 21, 2006.  He went to [facility] on November 5.  Dr.  Gala Romney saw the patient.  After shock times two, the patient converted to  accelerated junctional rhythm with occasional sinus rhythm.  He was set up  for followup today.  The patient tells me that he felt as though he went  into sinus rhythm sometime after going home from the hospital on November 5.  He felt like he was really in sinus rhythm until today.  He has really no  symptoms with this.  Denies any fatigue.  Denies any shortness of breath,  chest pain, palpitations, syncope, presyncope.  Denies any orthopnea or  paroxysmal nocturnal dyspnea.  Denies any lower extremity edema.  Denies any  nausea, vomiting or diarrhea.  He says that he can usually tell that he is  back out of rhythm when he listens to his AV fistula in his right arm.  He  can note the irregularities in his heartbeat.  He also notes worsening sinus  congestion.  He has had a long history of sinus pressure.   CURRENT MEDICATIONS:  1. Multivitamin daily.  2. Nexium 40 mg b.i.d.  3. Finasteride 5 mg daily.  4. Furosemide 20 mg b.i.d.  5. Oxybutynin 10 mg daily.  6. Myfortic 360 mg b.i.d.  7. Flomax 0.4 mg b.i.d.  8. Prograf 1 mg four tablets b.i.d.  9. Enalapril 5 mg daily.  10.Metoprolol 100 mg daily.  11.Senokot b.i.d.  12.Magnesium 400 mg b.i.d.  13.Fluticasone p.r.n.  14.Methocarbamol p.r.n.  15.Oxycodone p.r.n.   PHYSICAL EXAMINATION:  GENERAL:  He is a well-nourished, well-developed  male, in no distress.  VITAL SIGNS:  Blood pressure 140/82, pulse 95.  HEENT:  Unremarkable.  NECK:  No JVD.  COR:  Normal S1, S2, irregularly irregular rhythm.  LUNGS:  Clear to auscultation bilaterally, without wheezing, rhonchi or  rales.  ABDOMEN:  Soft, nontender.  EXTREMITIES:  Without edema.  Calves are soft, nontender.  SKIN:  Warm and dry.  NEUROLOGIC:  He is alert and oriented times three.  Cranial nerves II  through XII are grossly intact.   Electrocardiogram reveals atrial fibrillation with a heart rate of 95, left  axis deviation, no significant change since previous tracings.   IMPRESSION:  1. Persistent atrial fibrillation.  2. Status post right total knee replacement, February 12, 2006.  3. End-stage renal disease, status  post kidney transplant.  4. Hypertension.  5. Good left ventricular function.  6. Gastroesophageal reflux disease.  7. Benign prostatic hypertrophy.   PLAN:  The patient presents to the office today for followup, post-  cardioversion.  He has reverted back to atrial fibrillation and seems to be  in persistent atrial fibrillation.  His rate is fairly well-controlled  today.  He is virtually asymptomatic with this.  I had a long discussion  with him today, about rate, versus rhythm, control.  I think we should  proceed with just rate control at this point in time.  He is agreeable with  this.  He has plenty of room in his blood pressure and I am going to  increase his metoprolol to 100 mg one and a half tablets b.i.d.  He stopped  his Coumadin after cardioversion.  He will need to remain on Coumadin  therapy.  I have asked him to go ahead and restart this and we will get him  set up with PT and INR check next week at the [facility].  I will also  set him up for a 24-hour  Holter monitor in the next week or two to reassess  his rate control.  He lives in [place] and prefers to follow up in  [place].  Dr. Gala Romney performed his cardioversion.  Therefore, I will  set him up to see Dr. Gala Romney within the next three weeks or so.      Tereso Newcomer, PA-C  Electronically Signed      Luis Abed, MD, [facility]  Electronically Signed   SW/MedQ  DD: 05/01/2006  DT: 05/01/2006  Job #: [id number]   cc:   Pricilla Riffle, MD, [facility]

## 2010-11-08 NOTE — Assessment & Plan Note (Signed)
[facility]                              CARDIOLOGY OFFICE NOTE   NAME:Benninger, BARETT WHIDBEE                         MRN:          [id number]  DATE:03/12/2006                            DOB:          02/06/1941    IDENTIFICATION:  Mr. Suastegui is a 70 year old gentleman who I saw in the  consult service back on August 23rd for atrial fibrillation, which was  reported new onset.  He was status post knee surgery.   The patient was discharged from [facility] on the 27th on Coumadin  therapy.  Note prior to admission, he had a Myoview scan done in [place]  in 2006 (no ischemia).  He also has a history of hypertension, end-stage  renal disease, status post renal transplant and GE reflux.   Since discharge, he says his leg is healing well.  He denies shortness of  breath.  No chest pressure.  No palpitations.   CURRENT MEDICATIONS:  1. Multivitamin daily.  2. Nexium 40 b.i.d.  3. Finasteride 5 daily.  4. Lasix 20 b.i.d.  5. Oxybutynin 10 daily.  6. __________ 360 b.i.d.  7. Flomax 0.4 b.i.d.  8. Prograf 4 mg b.i.d.  9. Enalapril 5 daily.  10.Metoprolol 100 b.i.d.  11.Senokot.  12.Magnesium 400 b.i.d.  13.Coumadin as directed.   PHYSICAL EXAMINATION:  Patient is in no distress.  Blood pressure is 102/58,  pulse 81 and irregular.  Weight 164.  Lungs are clear.  NECK:  JVP is  normal.  CARDIAC:  Irregularly irregular.  S1 and S2.  No S3.  No  significant murmurs.  ABDOMEN:  No hepatomegaly.  EXTREMITIES:  Right knee  is swollen.  Sutures appear intact with no gross erythema.   Echocardiogram done on August 24 shows normal LV function with an LVEF of 65-  70%.  Left atrium is mild-to-moderately dilated by report with the  measurement at 45, suggestive of mild dilatation.  Estimated PAP at the time  was 49 mmHg.   IMPRESSION:  Mr. Bodie is a 70 year old gentleman with a history of  hypertension, renal transplant, now with atrial  fibrillation.  Not clear how  long duration.  He was found in the OR to have irregular rhythm.  Remains in  this.  On Coumadin therapy.  Will need to follow and I would recommend a  trial of cardioversion to see if he can maintain normal rhythm.   If he does not, then rate control and Coumadin therapy.   I will set the patient up for Coumadin clinic for close monitoring and once  his INR is noted to be therapeutic x4 weeks, we will plan cardioversion.  Patient understands and is eager to proceed.                                Pricilla Riffle, MD, [facility]    PVR/MedQ  DD:  03/13/2006  DT:  03/16/2006  Job #:  [id number]   cc:   [facility]

## 2010-11-08 NOTE — Op Note (Signed)
NAME:  Matthew Brown, Matthew Brown                  ACCOUNT NO.:  [id number]   MEDICAL RECORD NO.:  [id number]          PATIENT TYPE:  AMB   LOCATION:  SDS                          FACILITY:  [facility]   PHYSICIAN:  Deidre Ala, M.D.    DATE OF BIRTH:  08/16/1940   DATE OF PROCEDURE:  04/22/2005  DATE OF DISCHARGE:                                 OPERATIVE REPORT   PREOPERATIVE DIAGNOSES:  1.  Right knee degenerative medial meniscal tear.  2.  Tricompartment degenerative joint disease with pseudogout.   POSTOPERATIVE DIAGNOSES:  1.  Degenerative medial and lateral meniscal tears.  2.  Tricompartment degenerative joint disease.  3.  Synovitis tricompartment.  4.  Tight lateral retinaculum and patellofemoral disease.   OPERATION PERFORMED:  1.  Right knee arthroscopy with partial medial and lateral meniscectomies.  2.  Abrasion ablation chondroplasty of tricompartment.  3.  Arthroscopic lateral retinacular release.  4.  Synovectomy tricompartment.   SURGEON:  1.  Charlesetta Shanks, M.D.   ASSISTANT:  Matthew Brown, P.A.-C.   ANESTHESIA:  General endotracheal.   CULTURES:  None.   DRAINS:  None.   ESTIMATED BLOOD LOSS:  Minimal.   PATHOLOGIC FINDINGS AND HISTORY:  Matthew Brown is a 70 year old male who has had  problems with his knee for almost two years.  He fell nine months prior to  July 03, 2003.  X-rays revealed right knee pseudogout. He was treated  initially with cortisone injection and made some progress.  In the meantime,  he had a renal transplant at [facility].  He  came back in March 21, 2005.  The MRI showed chondrocalcinosis of his  right knee.  It had several knee aspirations.  The precipitating event was a  twisting injury to his knee when he was getting off a bus. He was miserable.  He wanted to proceed with arthroscopic debridement.  I did not feel he had  end stage degenerative joint disease.  The transplant coordinator at [facility] felt that he  was able to undergo surgery.  At surgery he had  tricompartment synovitis. He had grade 3 changes of the medial and lateral  tibial condyles and grade 2 medial and lateral femoral condyles. He had a  significant degenerative medial meniscal tear especially the posterior horn  and the lateral meniscus was degenerative from front and back with  pseudogout crystals deposited throughout.  The ACL was intact.  He had a  significantly degenerated posterior patella, grade 3 to 4 and a tight  lateral retinaculum and fairly abnormal looking synovitis throughout the  joint with calcium pyrophosphate deposition disease.  We thoroughly debrided  all surfaces of the menisci back to stable rims, lateral retinacular  release, synovectomies tricompartment and we smoothed the surfaces with the  ablator on 1.   DESCRIPTION OF PROCEDURE:  With adequate anesthesia obtained using  endotracheal technique, 1 g Ancef given IV prophylaxis, the patient was  placed in supine position.  The right lower extremity was prepped from the  malleoli to the leg holder in the standard fashion.  After standard prepping  and draping, Esmarch exsanguination was used.  The tourniquet was let up to  350 mmHg.  Superolateral inflow portals were made.  The knee was insufflated  with normal saline with an arthroscopic pump.  Medial and lateral scope  portals were then made and the joint was thoroughly inspected.  I then  shaved out the medial plica and synovitis back to the side wall and shaved  out the medial gutter and lysed the medial band.  I then exposed the medial  meniscus and with basket and shaver, saucerized the medial meniscus back to  a stable rim and used the ablator on one to smooth as well as the medial  femoral condyle, medial tibial plateau.  I then used the ablator to tighten  up the anterior cruciate ligament.  I then reversed portals and shaved the  inner rim lateral meniscus using basket and shaver, used the  ablator on one  to smooth as well as the lateral femoral condyle.  I then shaved out the  lateral gutter synovitis.  I then did an arthroscopic lateral retinacular  release from vastus lateralis to the joint line. I then shaved the posterior  patella and used the ablator on one to smooth.  I then shaved the pouch  synovitis.  When I was satisfied with the resections, the knee was irrigated  through the scope.  0.5% Marcaine plain with clonidine and morphine were  injected in the joint.  Portals were left open and a bulky sterile  compressive dressing was applied with lateral pad for tamponade and E-Z wrap  placed.  The patient then having tolerated the procedure well, was awakened  and taken to the recovery room in satisfactory condition to be discharged  per outpatient routine, crutches, weightbearing as tolerated, told to call  the office for appointment for recheck tomorrow.           ______________________________  V. Charlesetta Shanks, M.D.     VEP/MEDQ  D:  04/22/2005  T:  04/23/2005  Job:  [id number]   cc:   [facility] transplant coordinator

## 2010-11-08 NOTE — Discharge Summary (Signed)
NAME:  RAMONDO, DIETZE                  ACCOUNT NO.:  [id number]   MEDICAL RECORD NO.:  [id number]          PATIENT TYPE:  OIB   LOCATION:  2853                         FACILITY:  [facility]   PHYSICIAN:  Bevelyn Buckles. Bensimhon, MDDATE OF BIRTH:  [birth year]   DATE OF ADMISSION:  04/27/2006  DATE OF DISCHARGE:                           DISCHARGE SUMMARY - REFERRING   PATIENT IDENTIFICATION:  Mr. Currier is a 70 year old man who was found to be  in atrial flutter.  He has been anticoagulated with Coumadin at therapeutic  level for over a month now.  He presented for elective electrical  cardioversion.   DESCRIPTION OF PROCEDURE:  The risks and benefits of cardioversion were  explained.  Consent was signed and placed on the chart.  He was sedated per  anesthesia.   He received 120 joules synchronized biphasic shock with what appeared to be  persistent atrial flutter.  He then received a repeat 200-joule synchronized  biphasic shock with conversion to a predominant accelerated junctional  rhythm between 95 and 105 beats per minute.  There was occasional sinus  beats.   There were no apparent complications.   PLAN:  Continue current medications.  He will need a followup EKG at the end  of this week as an outpatient to reevaluate his rhythm.      Bevelyn Buckles. Bensimhon, MD  Electronically Signed     DRB/MEDQ  D:  04/27/2006  T:  04/27/2006  Job:  [id number]

## 2010-11-13 ENCOUNTER — Ambulatory Visit (INDEPENDENT_AMBULATORY_CARE_PROVIDER_SITE_OTHER): Payer: Medicare Other | Admitting: Emergency Medicine

## 2010-11-13 DIAGNOSIS — I4891 Unspecified atrial fibrillation: Secondary | ICD-10-CM

## 2010-11-13 LAB — POCT INR: INR: 2.1

## 2010-12-04 ENCOUNTER — Encounter: Payer: Self-pay | Admitting: Internal Medicine

## 2010-12-11 ENCOUNTER — Ambulatory Visit (INDEPENDENT_AMBULATORY_CARE_PROVIDER_SITE_OTHER): Payer: Medicare Other | Admitting: Internal Medicine

## 2010-12-11 ENCOUNTER — Encounter: Payer: Self-pay | Admitting: Internal Medicine

## 2010-12-11 ENCOUNTER — Ambulatory Visit (INDEPENDENT_AMBULATORY_CARE_PROVIDER_SITE_OTHER): Payer: Medicare Other | Admitting: Emergency Medicine

## 2010-12-11 VITALS — BP 129/63 | HR 54 | Ht 68.0 in | Wt 167.8 lb

## 2010-12-11 DIAGNOSIS — I4891 Unspecified atrial fibrillation: Secondary | ICD-10-CM

## 2010-12-11 NOTE — Patient Instructions (Signed)
Your physician recommends that you schedule a follow-up appointment in: 6 months  

## 2010-12-11 NOTE — Assessment & Plan Note (Signed)
Chronic. Rate controlled. Very well tolerated. Continue coumadin. We discussed the fact that immunosuppresants can increase risk of atherosclerosis and may consider repeat stress at some point. However, given the fact that he is totally asymptomatic will defer for now.

## 2010-12-11 NOTE — Progress Notes (Signed)
HPI:  Matthew Brown is a delightful 70 year old male with a history of chronic atrial fibrillation and flutter with a normal ejection fraction.  He also has a negative functional study in [place] in 2006.  Remainder of his medical history is notable for end-stage renal disease status post kidney transplant in May 2006, now with normal creatinine, hypertension, and gastroesophageal reflux disease.  Doing great. No CP, shortness of breath or palpitations. Working in his gardens and on IT trainer. Feels great. No swelling. INR in range. No problems with bleeding.   ROS: All systems negative except as listed in HPI, PMH and Problem List.  Past Medical History  Diagnosis Date  . Atrial fibrillation   . Hyperlipidemia   . Benign prostatic hypertrophy   . GERD (gastroesophageal reflux disease)   . Osteoarthrosis, unspecified whether generalized or localized, lower leg   . Hypertension   . End stage renal failure on dialysis   . DJD (degenerative joint disease) of knee     Current Outpatient Prescriptions  Medication Sig Dispense Refill  . allopurinol (ZYLOPRIM) 100 MG tablet Take 100 mg by mouth daily.        [name] aspirin 81 MG EC tablet Take 81 mg by mouth daily.        . cinacalcet (SENSIPAR) 30 MG tablet Take 15 mg by mouth daily.        . enalapril (VASOTEC) 10 MG tablet Take 10 mg by mouth 2 (two) times daily.        [name] esomeprazole (NEXIUM) 40 MG capsule Take 40 mg by mouth daily before breakfast.        . finasteride (PROSCAR) 5 MG tablet Take 5 mg by mouth daily.        . furosemide (LASIX) 20 MG tablet Take 40 mg by mouth 2 (two) times daily.        . metoprolol (TOPROL-XL) 200 MG 24 hr tablet Take 200 mg by mouth 2 (two) times daily.        . Multiple Vitamin (MULTIVITAMIN) tablet Take 1 tablet by mouth daily.        . mycophenolate (MYFORTIC) 360 MG TBEC Take 360 mg by mouth 2 (two) times daily.        [name] oxybutynin (DITROPAN-XL) 5 MG 24 hr tablet Take 5 mg by mouth daily.        . tacrolimus  (PROGRAF) 1 MG capsule Take 1 mg by mouth 3 (three) times daily.        . Tamsulosin HCl (FLOMAX) 0.4 MG CAPS Take 0.4 mg by mouth daily.        [name] warfarin (COUMADIN) 4 MG tablet Take 1 tablet (4 mg total) by mouth as directed.  30 tablet  6     PHYSICAL EXAM: Filed Vitals:   12/11/10 1054  BP: 129/63  Pulse: 54   Gen: well appearing. no resp difficulty HEENT: normal Neck: supple. no JVD. Carotids 2+ bilat; no bruits. No lymphadenopathy or thryomegaly appreciated. Cor: PMI nondisplaced. Irregular  rhythm. No rubs, gallops, murmur. Lungs: clear Abdomen: soft, nontender, nondistended. No hepatosplenomegaly.  Extremities: no cyanosis, clubbing, rash, edema. Large AVF in R forearm Neuro: alert & orientedx3, cranial nerves grossly intact. moves all 4 extremities w/o difficulty. affect pleasant   ECG:  AF 54 LAFB RSR' No ST-T wave abnormalities.     ASSESSMENT & PLAN:

## 2011-01-15 ENCOUNTER — Ambulatory Visit (INDEPENDENT_AMBULATORY_CARE_PROVIDER_SITE_OTHER): Payer: Medicare Other | Admitting: Emergency Medicine

## 2011-01-15 DIAGNOSIS — I4891 Unspecified atrial fibrillation: Secondary | ICD-10-CM

## 2011-01-15 LAB — POCT INR: INR: 3.2

## 2011-02-12 ENCOUNTER — Ambulatory Visit (INDEPENDENT_AMBULATORY_CARE_PROVIDER_SITE_OTHER): Payer: Medicare Other | Admitting: Emergency Medicine

## 2011-02-12 DIAGNOSIS — I4891 Unspecified atrial fibrillation: Secondary | ICD-10-CM

## 2011-02-12 LAB — POCT INR: INR: 3.1

## 2011-03-12 ENCOUNTER — Encounter: Payer: Medicare Other | Admitting: Emergency Medicine

## 2011-03-19 ENCOUNTER — Ambulatory Visit (INDEPENDENT_AMBULATORY_CARE_PROVIDER_SITE_OTHER): Payer: Medicare Other | Admitting: Emergency Medicine

## 2011-03-19 DIAGNOSIS — I4891 Unspecified atrial fibrillation: Secondary | ICD-10-CM

## 2011-03-19 LAB — POCT INR: INR: 2.6

## 2011-04-16 ENCOUNTER — Encounter: Payer: Medicare Other | Admitting: Emergency Medicine

## 2011-04-23 ENCOUNTER — Ambulatory Visit (INDEPENDENT_AMBULATORY_CARE_PROVIDER_SITE_OTHER): Payer: Medicare Other | Admitting: Emergency Medicine

## 2011-04-23 ENCOUNTER — Encounter: Payer: Medicare Other | Admitting: Emergency Medicine

## 2011-04-23 DIAGNOSIS — Z7901 Long term (current) use of anticoagulants: Secondary | ICD-10-CM

## 2011-04-23 DIAGNOSIS — I4891 Unspecified atrial fibrillation: Secondary | ICD-10-CM

## 2011-04-23 LAB — POCT INR: INR: 2.3

## 2011-05-18 ENCOUNTER — Other Ambulatory Visit: Payer: Self-pay | Admitting: Internal Medicine

## 2011-05-19 NOTE — Telephone Encounter (Signed)
rx request 

## 2011-05-21 ENCOUNTER — Encounter: Payer: Medicare Other | Admitting: Emergency Medicine

## 2011-05-23 ENCOUNTER — Telehealth: Payer: Self-pay | Admitting: *Deleted

## 2011-05-23 NOTE — Telephone Encounter (Signed)
Pt called stating his nephrologist at [facility], Dr. Lyla Son True has instructed him to hold his coumadin starting today for renal biopsy next Friday 05/30/11. We did not receive clearance request, pt is notifying us he is going to hold coumadin starting tonight. Pt takes for a fib, was last seen in office 11/2010 and was in a fib. Pt has recall for f/u next month. Pt will restart coumadin after procedure and will f/u as scheduled for INR check on 06/11/11. Will forward to Dr. Gala Romney to make aware.

## 2011-06-11 ENCOUNTER — Encounter: Payer: Medicare Other | Admitting: Emergency Medicine

## 2011-07-16 LAB — PROTIME-INR: INR: 3.7 — AB (ref <=1.1)

## 2011-07-17 ENCOUNTER — Ambulatory Visit (INDEPENDENT_AMBULATORY_CARE_PROVIDER_SITE_OTHER): Payer: Self-pay | Admitting: Internal Medicine

## 2011-07-17 DIAGNOSIS — R0989 Other specified symptoms and signs involving the circulatory and respiratory systems: Secondary | ICD-10-CM

## 2011-07-17 DIAGNOSIS — Z7901 Long term (current) use of anticoagulants: Secondary | ICD-10-CM

## 2011-07-17 DIAGNOSIS — I4891 Unspecified atrial fibrillation: Secondary | ICD-10-CM

## 2011-07-30 ENCOUNTER — Ambulatory Visit (INDEPENDENT_AMBULATORY_CARE_PROVIDER_SITE_OTHER): Payer: Medicare Other | Admitting: Emergency Medicine

## 2011-07-30 DIAGNOSIS — Z7901 Long term (current) use of anticoagulants: Secondary | ICD-10-CM

## 2011-07-30 DIAGNOSIS — I4891 Unspecified atrial fibrillation: Secondary | ICD-10-CM

## 2011-07-30 LAB — POCT INR: INR: 2.3

## 2011-08-27 ENCOUNTER — Ambulatory Visit (INDEPENDENT_AMBULATORY_CARE_PROVIDER_SITE_OTHER): Payer: Medicare Other

## 2011-08-27 DIAGNOSIS — Z7901 Long term (current) use of anticoagulants: Secondary | ICD-10-CM

## 2011-08-27 DIAGNOSIS — I4891 Unspecified atrial fibrillation: Secondary | ICD-10-CM

## 2011-09-24 ENCOUNTER — Ambulatory Visit (INDEPENDENT_AMBULATORY_CARE_PROVIDER_SITE_OTHER): Payer: Medicare Other

## 2011-09-24 DIAGNOSIS — I4891 Unspecified atrial fibrillation: Secondary | ICD-10-CM

## 2011-09-24 DIAGNOSIS — Z7901 Long term (current) use of anticoagulants: Secondary | ICD-10-CM

## 2011-09-24 LAB — POCT INR: INR: 4.4

## 2011-11-26 ENCOUNTER — Ambulatory Visit (INDEPENDENT_AMBULATORY_CARE_PROVIDER_SITE_OTHER): Payer: Medicare Other

## 2011-11-26 DIAGNOSIS — Z7901 Long term (current) use of anticoagulants: Secondary | ICD-10-CM

## 2011-11-26 DIAGNOSIS — I4891 Unspecified atrial fibrillation: Secondary | ICD-10-CM

## 2011-11-26 LAB — POCT INR: INR: 3.7

## 2011-12-17 ENCOUNTER — Ambulatory Visit (INDEPENDENT_AMBULATORY_CARE_PROVIDER_SITE_OTHER): Payer: Medicare Other

## 2011-12-17 DIAGNOSIS — Z7901 Long term (current) use of anticoagulants: Secondary | ICD-10-CM

## 2011-12-17 DIAGNOSIS — I4891 Unspecified atrial fibrillation: Secondary | ICD-10-CM

## 2011-12-17 LAB — POCT INR: INR: 2.9

## 2012-01-21 ENCOUNTER — Telehealth: Payer: Self-pay

## 2012-01-21 NOTE — Telephone Encounter (Signed)
Called spoke with pt, he is past due for Coumadin follow-up.  Pt states he cancelled appt on 01/14/12 secondary to unable to pay $40 copay.  Pt states he is going to [facility] hospital to have bloodwork done on 02/03/12 and they will draw an INR and he will bring the result to [place].  Pt is aware of risks associated with not monitoring Coumadin as instructed.

## 2012-02-03 LAB — LAB REPORT - SCANNED: INR: 3.2

## 2012-02-04 ENCOUNTER — Ambulatory Visit (INDEPENDENT_AMBULATORY_CARE_PROVIDER_SITE_OTHER): Payer: Self-pay | Admitting: Cardiovascular Disease

## 2012-02-04 DIAGNOSIS — I4891 Unspecified atrial fibrillation: Secondary | ICD-10-CM

## 2012-02-04 DIAGNOSIS — Z7901 Long term (current) use of anticoagulants: Secondary | ICD-10-CM

## 2012-03-03 ENCOUNTER — Ambulatory Visit (INDEPENDENT_AMBULATORY_CARE_PROVIDER_SITE_OTHER): Payer: Medicare Other

## 2012-03-03 DIAGNOSIS — I4891 Unspecified atrial fibrillation: Secondary | ICD-10-CM

## 2012-03-03 DIAGNOSIS — Z7901 Long term (current) use of anticoagulants: Secondary | ICD-10-CM

## 2012-03-03 LAB — POCT INR: INR: 3.1

## 2012-03-31 ENCOUNTER — Ambulatory Visit (INDEPENDENT_AMBULATORY_CARE_PROVIDER_SITE_OTHER): Payer: Medicare Other

## 2012-03-31 DIAGNOSIS — I4891 Unspecified atrial fibrillation: Secondary | ICD-10-CM

## 2012-03-31 DIAGNOSIS — Z7901 Long term (current) use of anticoagulants: Secondary | ICD-10-CM

## 2012-03-31 LAB — POCT INR: INR: 3.2

## 2012-06-07 ENCOUNTER — Other Ambulatory Visit: Payer: Self-pay | Admitting: *Deleted

## 2012-06-07 MED ORDER — WARFARIN SODIUM 4 MG PO TABS: 4.0000 mg | ORAL_TABLET | ORAL | Status: DC

## 2012-06-08 ENCOUNTER — Other Ambulatory Visit: Payer: Self-pay | Admitting: *Deleted

## 2012-06-08 MED ORDER — WARFARIN SODIUM 4 MG PO TABS: 4.0000 mg | ORAL_TABLET | ORAL | Status: DC

## 2012-06-09 ENCOUNTER — Other Ambulatory Visit: Payer: Self-pay

## 2012-06-09 MED ORDER — WARFARIN SODIUM 4 MG PO TABS: 4.0000 mg | ORAL_TABLET | ORAL | Status: DC

## 2012-08-30 ENCOUNTER — Telehealth: Payer: Self-pay | Admitting: *Deleted

## 2012-08-30 NOTE — Telephone Encounter (Signed)
Error

## 2012-09-01 ENCOUNTER — Ambulatory Visit (INDEPENDENT_AMBULATORY_CARE_PROVIDER_SITE_OTHER): Payer: Medicare Other | Admitting: Cardiovascular Disease

## 2012-09-01 DIAGNOSIS — Z7901 Long term (current) use of anticoagulants: Secondary | ICD-10-CM

## 2012-09-01 DIAGNOSIS — I4891 Unspecified atrial fibrillation: Secondary | ICD-10-CM

## 2012-09-29 ENCOUNTER — Ambulatory Visit (INDEPENDENT_AMBULATORY_CARE_PROVIDER_SITE_OTHER): Payer: Medicare Other

## 2012-09-29 DIAGNOSIS — Z7901 Long term (current) use of anticoagulants: Secondary | ICD-10-CM

## 2012-09-29 DIAGNOSIS — I4891 Unspecified atrial fibrillation: Secondary | ICD-10-CM

## 2012-09-29 LAB — POCT INR: INR: 2.8

## 2012-11-10 ENCOUNTER — Ambulatory Visit (INDEPENDENT_AMBULATORY_CARE_PROVIDER_SITE_OTHER): Payer: Medicare Other

## 2012-11-10 DIAGNOSIS — I4891 Unspecified atrial fibrillation: Secondary | ICD-10-CM

## 2012-11-10 DIAGNOSIS — Z7901 Long term (current) use of anticoagulants: Secondary | ICD-10-CM

## 2012-11-10 LAB — POCT INR: INR: 3.3

## 2012-12-15 ENCOUNTER — Ambulatory Visit (INDEPENDENT_AMBULATORY_CARE_PROVIDER_SITE_OTHER): Payer: Medicare Other

## 2012-12-15 DIAGNOSIS — Z7901 Long term (current) use of anticoagulants: Secondary | ICD-10-CM

## 2012-12-15 DIAGNOSIS — I4891 Unspecified atrial fibrillation: Secondary | ICD-10-CM

## 2012-12-15 LAB — POCT INR: INR: 1.8

## 2012-12-26 ENCOUNTER — Other Ambulatory Visit: Payer: Self-pay | Admitting: Internal Medicine

## 2013-01-12 ENCOUNTER — Ambulatory Visit (INDEPENDENT_AMBULATORY_CARE_PROVIDER_SITE_OTHER): Payer: Medicare Other

## 2013-01-12 DIAGNOSIS — Z7901 Long term (current) use of anticoagulants: Secondary | ICD-10-CM

## 2013-01-12 DIAGNOSIS — I4891 Unspecified atrial fibrillation: Secondary | ICD-10-CM

## 2013-01-12 LAB — POCT INR: INR: 1.6

## 2013-02-09 ENCOUNTER — Other Ambulatory Visit: Payer: Self-pay | Admitting: Pharmacist

## 2013-02-09 ENCOUNTER — Ambulatory Visit (INDEPENDENT_AMBULATORY_CARE_PROVIDER_SITE_OTHER): Payer: Medicare Other | Admitting: *Deleted

## 2013-02-09 DIAGNOSIS — I4891 Unspecified atrial fibrillation: Secondary | ICD-10-CM

## 2013-02-09 DIAGNOSIS — Z7901 Long term (current) use of anticoagulants: Secondary | ICD-10-CM

## 2013-02-09 MED ORDER — WARFARIN SODIUM 4 MG PO TABS: ORAL_TABLET | ORAL | Status: DC

## 2013-02-23 ENCOUNTER — Ambulatory Visit (INDEPENDENT_AMBULATORY_CARE_PROVIDER_SITE_OTHER): Payer: Medicare Other

## 2013-02-23 DIAGNOSIS — I4891 Unspecified atrial fibrillation: Secondary | ICD-10-CM

## 2013-02-23 DIAGNOSIS — Z7901 Long term (current) use of anticoagulants: Secondary | ICD-10-CM

## 2013-03-11 ENCOUNTER — Telehealth: Payer: Self-pay | Admitting: *Deleted

## 2013-03-11 NOTE — Telephone Encounter (Signed)
Patient wanted to let you know he his having colonoscopy on 9/25 and his MD during the procedure told him to stop his coumadin 7 days prior.

## 2013-03-11 NOTE — Telephone Encounter (Signed)
I have not seen him in a long time. But should be fine to hold coumadin for colonoscopy.

## 2013-03-11 NOTE — Telephone Encounter (Signed)
Spoke with pt.  He states he got a letter stating he had a colonoscopy next week with MD in [facility] (he does not know the MD name) and to stop Coumadin on 9/18.  There is no indication that this has been cleared by his cardiologist.  Last time he saw Dr. Gala Romney was 2012.  Will forward note to him for review.  Have also left a message for Nash Dimmer [phone], the care coordinator at [facility] for more information.

## 2013-03-11 NOTE — Telephone Encounter (Signed)
Spoke with Nash Dimmer.  Pt is having routine colonoscopy related to his transplant.  He was instructed to get clearance from cardiologist.  Dr. Gala Romney has cleared pt.  Pt aware.

## 2013-03-18 ENCOUNTER — Telehealth: Payer: Self-pay

## 2013-03-18 NOTE — Telephone Encounter (Signed)
Pt states he had colonoscopy, had polyps were removed, told him to hold his warfarin. Please advise.

## 2013-03-18 NOTE — Telephone Encounter (Signed)
I will forward to CC to advise.

## 2013-03-18 NOTE — Telephone Encounter (Signed)
Spoke with pt.  He was instructed to hold Coumadin x 7 days after his colonoscopy.  Told pt this would be fine.  Moved his follow up INR check to 10/8 so he would be on his Coumadin about 1 week when we checked his lab.

## 2013-03-30 ENCOUNTER — Ambulatory Visit (INDEPENDENT_AMBULATORY_CARE_PROVIDER_SITE_OTHER): Payer: Medicare Other | Admitting: General Practice

## 2013-03-30 DIAGNOSIS — I4891 Unspecified atrial fibrillation: Secondary | ICD-10-CM

## 2013-03-30 DIAGNOSIS — Z7901 Long term (current) use of anticoagulants: Secondary | ICD-10-CM

## 2013-03-30 LAB — POCT INR: INR: 2.5

## 2013-04-27 ENCOUNTER — Ambulatory Visit (INDEPENDENT_AMBULATORY_CARE_PROVIDER_SITE_OTHER): Payer: Medicare Other | Admitting: General Practice

## 2013-04-27 DIAGNOSIS — I4891 Unspecified atrial fibrillation: Secondary | ICD-10-CM

## 2013-04-27 DIAGNOSIS — Z7901 Long term (current) use of anticoagulants: Secondary | ICD-10-CM

## 2013-04-27 LAB — POCT INR: INR: 1.1

## 2013-05-04 ENCOUNTER — Ambulatory Visit (INDEPENDENT_AMBULATORY_CARE_PROVIDER_SITE_OTHER): Payer: Medicare Other | Admitting: *Deleted

## 2013-05-04 DIAGNOSIS — I4891 Unspecified atrial fibrillation: Secondary | ICD-10-CM

## 2013-05-04 DIAGNOSIS — Z7901 Long term (current) use of anticoagulants: Secondary | ICD-10-CM

## 2013-05-04 LAB — POCT INR: INR: 2.5

## 2013-05-25 ENCOUNTER — Ambulatory Visit (INDEPENDENT_AMBULATORY_CARE_PROVIDER_SITE_OTHER): Payer: Medicare Other | Admitting: General Practice

## 2013-05-25 DIAGNOSIS — Z7901 Long term (current) use of anticoagulants: Secondary | ICD-10-CM

## 2013-05-25 DIAGNOSIS — I4891 Unspecified atrial fibrillation: Secondary | ICD-10-CM

## 2013-06-22 ENCOUNTER — Ambulatory Visit (INDEPENDENT_AMBULATORY_CARE_PROVIDER_SITE_OTHER): Payer: Medicare Other

## 2013-06-22 DIAGNOSIS — Z7901 Long term (current) use of anticoagulants: Secondary | ICD-10-CM

## 2013-06-22 DIAGNOSIS — I4891 Unspecified atrial fibrillation: Secondary | ICD-10-CM

## 2013-06-22 LAB — POCT INR: INR: 1.5

## 2013-07-06 ENCOUNTER — Ambulatory Visit (INDEPENDENT_AMBULATORY_CARE_PROVIDER_SITE_OTHER): Payer: Commercial Managed Care - HMO

## 2013-07-06 DIAGNOSIS — Z7901 Long term (current) use of anticoagulants: Secondary | ICD-10-CM

## 2013-07-06 DIAGNOSIS — I4891 Unspecified atrial fibrillation: Secondary | ICD-10-CM

## 2013-07-06 LAB — POCT INR: INR: 3.1

## 2013-07-15 ENCOUNTER — Telehealth: Payer: Self-pay

## 2013-07-15 NOTE — Telephone Encounter (Signed)
Called spoke with pt advised Cipro cal increase the effects of Coumadin, however pt is on low dose Cipro 250mg .  Advised pt to start abx and made an appt to check INR on Wednesdays 07/20/13 in [place].  Pt verbalized understanding.

## 2013-07-15 NOTE — Telephone Encounter (Signed)
Pt called stating that he has an "infection in his prostate" and was placed on Cipro 250mg  x 30 days. His urologist advised him to call and let someone in the coumadin clinic know about this and get instructions on what to do with his coumadin dose. Please advise.  Thank you.

## 2013-07-15 NOTE — Telephone Encounter (Signed)
Pt called and has a question regarding his urlologist Dr. Doristine Section him on. Cipro. Pt is coumadin pt. Please call.

## 2013-07-18 ENCOUNTER — Other Ambulatory Visit: Payer: Self-pay | Admitting: *Deleted

## 2013-07-18 MED ORDER — WARFARIN SODIUM 4 MG PO TABS: ORAL_TABLET | ORAL | Status: DC

## 2013-07-18 NOTE — Telephone Encounter (Signed)
Please review and refill, Thank you. 

## 2013-07-27 ENCOUNTER — Ambulatory Visit (INDEPENDENT_AMBULATORY_CARE_PROVIDER_SITE_OTHER): Payer: Commercial Managed Care - HMO | Admitting: *Deleted

## 2013-07-27 DIAGNOSIS — I4891 Unspecified atrial fibrillation: Secondary | ICD-10-CM

## 2013-07-27 DIAGNOSIS — Z5181 Encounter for therapeutic drug level monitoring: Secondary | ICD-10-CM

## 2013-07-27 DIAGNOSIS — Z7901 Long term (current) use of anticoagulants: Secondary | ICD-10-CM

## 2013-07-27 LAB — POCT INR: INR: 2.2

## 2013-08-24 ENCOUNTER — Ambulatory Visit (INDEPENDENT_AMBULATORY_CARE_PROVIDER_SITE_OTHER): Payer: Commercial Managed Care - HMO

## 2013-08-24 DIAGNOSIS — Z7901 Long term (current) use of anticoagulants: Secondary | ICD-10-CM

## 2013-08-24 DIAGNOSIS — I4891 Unspecified atrial fibrillation: Secondary | ICD-10-CM

## 2013-08-24 DIAGNOSIS — Z5181 Encounter for therapeutic drug level monitoring: Secondary | ICD-10-CM

## 2013-08-24 LAB — POCT INR: INR: 4

## 2013-09-09 ENCOUNTER — Ambulatory Visit: Payer: Self-pay | Admitting: Podiatry

## 2013-09-10 ENCOUNTER — Other Ambulatory Visit: Payer: Self-pay | Admitting: Internal Medicine

## 2013-09-14 ENCOUNTER — Ambulatory Visit (INDEPENDENT_AMBULATORY_CARE_PROVIDER_SITE_OTHER): Payer: Commercial Managed Care - HMO

## 2013-09-14 DIAGNOSIS — Z7901 Long term (current) use of anticoagulants: Secondary | ICD-10-CM

## 2013-09-14 DIAGNOSIS — I4891 Unspecified atrial fibrillation: Secondary | ICD-10-CM

## 2013-09-14 DIAGNOSIS — Z5181 Encounter for therapeutic drug level monitoring: Secondary | ICD-10-CM

## 2013-09-14 LAB — POCT INR: INR: 2.1

## 2013-10-04 ENCOUNTER — Encounter: Payer: Self-pay | Admitting: Podiatry

## 2013-10-04 ENCOUNTER — Ambulatory Visit (INDEPENDENT_AMBULATORY_CARE_PROVIDER_SITE_OTHER): Payer: Commercial Managed Care - HMO | Admitting: Podiatry

## 2013-10-04 DIAGNOSIS — M216X9 Other acquired deformities of unspecified foot: Secondary | ICD-10-CM

## 2013-10-04 DIAGNOSIS — M779 Enthesopathy, unspecified: Secondary | ICD-10-CM

## 2013-10-04 DIAGNOSIS — L84 Corns and callosities: Secondary | ICD-10-CM

## 2013-10-04 MED ORDER — TRIAMCINOLONE ACETONIDE 10 MG/ML IJ SUSP
10.0000 mg | Freq: Once | INTRAMUSCULAR | Status: AC
Start: 2013-10-04 — End: 2013-10-04
  Administered 2013-10-04: 10 mg

## 2013-10-04 NOTE — Progress Notes (Signed)
   Subjective:    Patient ID: Matthew Brown, male    DOB: 1940-12-29, 73 y.o.   MRN: [id number]  HPI PT STATED LT FOOT CALLUS IS SORE FOR LONG TERM. THE FOOT IS GETTING WORSE. THE FOOT GET AGGRAVATED BY PUTTING PRESSURE ON IT. TRIED TO USED PATCHES FOR CALLUS, SOAKING WITH WARM WATER AND TRIED KEEP IT TRIM BUT NOTHING HELP.    Review of Systems  HENT: Positive for hearing loss.   Gastrointestinal: Positive for diarrhea and constipation.  Musculoskeletal: Positive for back pain and joint swelling.  Skin: Positive for color change.  Neurological: Positive for light-headedness and headaches.  All other systems reviewed and are negative.      Objective:   Physical Exam        Assessment & Plan:

## 2013-10-05 NOTE — Progress Notes (Signed)
Subjective:     Patient ID: Matthew Brown, male   DOB: [birth year], 73 y.o.   MRN: [id number]  HPI patient presents stating this callus on my left foot is becoming increasingly painful and hard for me to walk. States it's been building over the last couple years   Review of Systems  All other systems reviewed and are negative.      Objective:   Physical Exam  Nursing note and vitals reviewed. Constitutional: He is oriented to person, place, and time.  Cardiovascular: Intact distal pulses.   Musculoskeletal: Normal range of motion.  Neurological: He is oriented to person, place, and time.  Skin: Skin is warm.   neurovascular status intact with muscle strength adequate in range of motion of the subtalar and midtarsal joint within normal limits. Patient is found to have good Flow time to the digits and severe painful keratotic lesion sub-fifth metatarsal head left with fluid buildup occurring underneath the callus itself     Assessment:     Plantarflexed metatarsal with inflammatory capsulitis fifth MPJ left    Plan:     H&P and x-rays reviewed. Discussed condition and recommended injection treatment along with deep debridement with consideration for surgery if symptoms were to worsen or not improve. I did a sterile prep and injected around the capsule 3 mg dexamethasone Kenalog and then debrided the tissue fully with no iatrogenic bleeding noted. Reappoint as needed

## 2013-10-12 ENCOUNTER — Ambulatory Visit (INDEPENDENT_AMBULATORY_CARE_PROVIDER_SITE_OTHER): Payer: Medicare PPO

## 2013-10-12 DIAGNOSIS — Z7901 Long term (current) use of anticoagulants: Secondary | ICD-10-CM

## 2013-10-12 DIAGNOSIS — I4891 Unspecified atrial fibrillation: Secondary | ICD-10-CM

## 2013-10-12 DIAGNOSIS — Z5181 Encounter for therapeutic drug level monitoring: Secondary | ICD-10-CM

## 2013-10-12 LAB — POCT INR: INR: 3.4

## 2013-11-02 ENCOUNTER — Ambulatory Visit (INDEPENDENT_AMBULATORY_CARE_PROVIDER_SITE_OTHER): Payer: Medicare PPO

## 2013-11-02 DIAGNOSIS — Z5181 Encounter for therapeutic drug level monitoring: Secondary | ICD-10-CM

## 2013-11-02 DIAGNOSIS — Z7901 Long term (current) use of anticoagulants: Secondary | ICD-10-CM

## 2013-11-02 DIAGNOSIS — I4891 Unspecified atrial fibrillation: Secondary | ICD-10-CM

## 2013-11-02 LAB — POCT INR: INR: 3.8

## 2013-11-16 ENCOUNTER — Ambulatory Visit (INDEPENDENT_AMBULATORY_CARE_PROVIDER_SITE_OTHER): Payer: Medicare PPO

## 2013-11-16 DIAGNOSIS — Z5181 Encounter for therapeutic drug level monitoring: Secondary | ICD-10-CM

## 2013-11-16 DIAGNOSIS — I4891 Unspecified atrial fibrillation: Secondary | ICD-10-CM

## 2013-11-16 DIAGNOSIS — Z7901 Long term (current) use of anticoagulants: Secondary | ICD-10-CM

## 2013-11-16 LAB — POCT INR: INR: 1.9

## 2013-12-07 ENCOUNTER — Ambulatory Visit (INDEPENDENT_AMBULATORY_CARE_PROVIDER_SITE_OTHER): Payer: Commercial Managed Care - HMO

## 2013-12-07 DIAGNOSIS — I4891 Unspecified atrial fibrillation: Secondary | ICD-10-CM

## 2013-12-07 DIAGNOSIS — Z5181 Encounter for therapeutic drug level monitoring: Secondary | ICD-10-CM

## 2013-12-07 DIAGNOSIS — Z7901 Long term (current) use of anticoagulants: Secondary | ICD-10-CM

## 2013-12-07 LAB — POCT INR: INR: 2.4

## 2013-12-21 NOTE — Telephone Encounter (Signed)
This encounter was created in error - please disregard.

## 2013-12-30 ENCOUNTER — Telehealth: Payer: Self-pay

## 2013-12-30 NOTE — Telephone Encounter (Signed)
Pt has a question regarding his coumadin please call.

## 2013-12-30 NOTE — Telephone Encounter (Signed)
Pt called has an appt in [place] on 01/04/14, unable to make scheduled f/u appt in [facility] on that date.  Advised pt ok to have INR checked at [facility] appt and results forwarded to [place] to dose.  Pt states if he gets out of appt early he will call our office for later appt that day 01/04/14.

## 2014-01-04 ENCOUNTER — Ambulatory Visit (INDEPENDENT_AMBULATORY_CARE_PROVIDER_SITE_OTHER): Payer: Commercial Managed Care - HMO | Admitting: Cardiovascular Disease

## 2014-01-04 DIAGNOSIS — I4891 Unspecified atrial fibrillation: Secondary | ICD-10-CM

## 2014-01-04 DIAGNOSIS — Z5181 Encounter for therapeutic drug level monitoring: Secondary | ICD-10-CM

## 2014-01-04 LAB — PROTIME-INR: INR: 3.4 — AB (ref <=1.1)

## 2014-01-25 ENCOUNTER — Ambulatory Visit (INDEPENDENT_AMBULATORY_CARE_PROVIDER_SITE_OTHER): Payer: Commercial Managed Care - HMO

## 2014-01-25 DIAGNOSIS — Z7901 Long term (current) use of anticoagulants: Secondary | ICD-10-CM | POA: Diagnosis not present

## 2014-01-25 DIAGNOSIS — I4891 Unspecified atrial fibrillation: Secondary | ICD-10-CM | POA: Diagnosis not present

## 2014-01-25 DIAGNOSIS — Z5181 Encounter for therapeutic drug level monitoring: Secondary | ICD-10-CM

## 2014-01-25 LAB — POCT INR: INR: 2.4

## 2014-02-22 ENCOUNTER — Ambulatory Visit (INDEPENDENT_AMBULATORY_CARE_PROVIDER_SITE_OTHER): Payer: Commercial Managed Care - HMO

## 2014-02-22 DIAGNOSIS — Z7901 Long term (current) use of anticoagulants: Secondary | ICD-10-CM

## 2014-02-22 DIAGNOSIS — Z5181 Encounter for therapeutic drug level monitoring: Secondary | ICD-10-CM

## 2014-02-22 DIAGNOSIS — I4891 Unspecified atrial fibrillation: Secondary | ICD-10-CM

## 2014-02-22 LAB — POCT INR: INR: 2.1

## 2014-03-13 ENCOUNTER — Other Ambulatory Visit: Payer: Self-pay

## 2014-03-15 ENCOUNTER — Other Ambulatory Visit: Payer: Self-pay

## 2014-03-15 MED ORDER — WARFARIN SODIUM 4 MG PO TABS: ORAL_TABLET | ORAL | Status: DC

## 2014-03-15 NOTE — Telephone Encounter (Signed)
This encounter was created in error - please disregard.

## 2014-03-22 ENCOUNTER — Ambulatory Visit (INDEPENDENT_AMBULATORY_CARE_PROVIDER_SITE_OTHER): Payer: Commercial Managed Care - HMO

## 2014-03-22 DIAGNOSIS — Z7901 Long term (current) use of anticoagulants: Secondary | ICD-10-CM

## 2014-03-22 DIAGNOSIS — Z5181 Encounter for therapeutic drug level monitoring: Secondary | ICD-10-CM

## 2014-03-22 DIAGNOSIS — I4891 Unspecified atrial fibrillation: Secondary | ICD-10-CM

## 2014-03-22 LAB — POCT INR: INR: 1.7

## 2014-04-12 ENCOUNTER — Ambulatory Visit (INDEPENDENT_AMBULATORY_CARE_PROVIDER_SITE_OTHER): Payer: Commercial Managed Care - HMO

## 2014-04-12 DIAGNOSIS — I4891 Unspecified atrial fibrillation: Secondary | ICD-10-CM

## 2014-04-12 DIAGNOSIS — Z5181 Encounter for therapeutic drug level monitoring: Secondary | ICD-10-CM

## 2014-04-12 DIAGNOSIS — Z7901 Long term (current) use of anticoagulants: Secondary | ICD-10-CM

## 2014-04-12 LAB — POCT INR: INR: 2.3

## 2014-05-10 ENCOUNTER — Ambulatory Visit (INDEPENDENT_AMBULATORY_CARE_PROVIDER_SITE_OTHER): Payer: Commercial Managed Care - HMO

## 2014-05-10 DIAGNOSIS — I4891 Unspecified atrial fibrillation: Secondary | ICD-10-CM

## 2014-05-10 DIAGNOSIS — Z5181 Encounter for therapeutic drug level monitoring: Secondary | ICD-10-CM

## 2014-05-10 DIAGNOSIS — Z7901 Long term (current) use of anticoagulants: Secondary | ICD-10-CM

## 2014-05-10 LAB — POCT INR: INR: 2.7

## 2014-06-21 ENCOUNTER — Ambulatory Visit (INDEPENDENT_AMBULATORY_CARE_PROVIDER_SITE_OTHER): Payer: Commercial Managed Care - HMO

## 2014-06-21 DIAGNOSIS — I4891 Unspecified atrial fibrillation: Secondary | ICD-10-CM

## 2014-06-21 DIAGNOSIS — Z5181 Encounter for therapeutic drug level monitoring: Secondary | ICD-10-CM

## 2014-06-21 DIAGNOSIS — Z7901 Long term (current) use of anticoagulants: Secondary | ICD-10-CM

## 2014-06-21 LAB — POCT INR: INR: 3.6

## 2014-07-05 ENCOUNTER — Ambulatory Visit (INDEPENDENT_AMBULATORY_CARE_PROVIDER_SITE_OTHER): Payer: Commercial Managed Care - HMO

## 2014-07-05 DIAGNOSIS — Z5181 Encounter for therapeutic drug level monitoring: Secondary | ICD-10-CM

## 2014-07-05 DIAGNOSIS — Z7901 Long term (current) use of anticoagulants: Secondary | ICD-10-CM

## 2014-07-05 DIAGNOSIS — I4891 Unspecified atrial fibrillation: Secondary | ICD-10-CM

## 2014-07-05 LAB — POCT INR: INR: 1.7

## 2014-07-10 ENCOUNTER — Telehealth: Payer: Self-pay | Admitting: Cardiovascular Disease

## 2014-07-10 MED ORDER — WARFARIN SODIUM 4 MG PO TABS: ORAL_TABLET | ORAL | Status: DC

## 2014-07-10 NOTE — Telephone Encounter (Signed)
°  1. Which medications need to be refilled? Blood Thinner  2. Which pharmacy is medication to be sent to? [facility]  3. Do they need a 30 day or 90 day supply? 30 Day  4. Would they like a call back once the medication has been sent to the pharmacy? Yes

## 2014-07-10 NOTE — Telephone Encounter (Signed)
Please review for refill. Thanks!  

## 2014-07-26 ENCOUNTER — Ambulatory Visit (INDEPENDENT_AMBULATORY_CARE_PROVIDER_SITE_OTHER): Payer: Commercial Managed Care - HMO

## 2014-07-26 DIAGNOSIS — Z5181 Encounter for therapeutic drug level monitoring: Secondary | ICD-10-CM

## 2014-07-26 DIAGNOSIS — I4891 Unspecified atrial fibrillation: Secondary | ICD-10-CM

## 2014-07-26 DIAGNOSIS — Z7901 Long term (current) use of anticoagulants: Secondary | ICD-10-CM

## 2014-07-26 LAB — POCT INR: INR: 2.4

## 2014-08-23 ENCOUNTER — Ambulatory Visit (INDEPENDENT_AMBULATORY_CARE_PROVIDER_SITE_OTHER): Payer: Commercial Managed Care - HMO | Admitting: *Deleted

## 2014-08-23 DIAGNOSIS — I4891 Unspecified atrial fibrillation: Secondary | ICD-10-CM

## 2014-08-23 DIAGNOSIS — Z7901 Long term (current) use of anticoagulants: Secondary | ICD-10-CM

## 2014-08-23 DIAGNOSIS — Z5181 Encounter for therapeutic drug level monitoring: Secondary | ICD-10-CM

## 2014-08-23 LAB — POCT INR: INR: 2.8

## 2014-09-18 ENCOUNTER — Telehealth: Payer: Self-pay | Admitting: Cardiovascular Disease

## 2014-09-18 ENCOUNTER — Telehealth: Payer: Self-pay | Admitting: *Deleted

## 2014-09-18 NOTE — Telephone Encounter (Signed)
Patient called back and gave report from [facility] that he  should see PCP within 7-10 days.  Per [facility] PCP should advise patient  on when to restart meds.   Patient was advised to call pcp to set up a [place] follow up .

## 2014-09-18 NOTE — Telephone Encounter (Signed)
Spoke with pt.  He had 3 episodes of bloody stools last weekend (~8 days ago).  He was taken to [facility].  He was taken to surgery on Monday to repair the bleed. He was discharged on Tuesday but continued to bleed so he went back to the hospital and was discharged on Thursday.  He was told to hold Coumadin, ASA and fluid pill until he hears from their office.    Suggested pt call the MD at [facility] for follow up if he has not heard from them by this afternoon as they will be the ones who make the decision about restarting warfarin.  Will cancel his upcoming anticoag appt.  He has been asked to call us whenever he restarts Coumadin.

## 2014-09-18 NOTE — Telephone Encounter (Signed)
Pt calling stating that Sunday he hit a blood vessel and had to have surgery at [place] do make it stop. He is off coumadin and needs to know when he can go back on it.  Please advise.

## 2014-09-26 ENCOUNTER — Telehealth: Payer: Self-pay

## 2014-09-26 NOTE — Telephone Encounter (Signed)
Dr Enid Derry called states she evaluated pt post hospital and he is not having any further bleeding problems since embolization done at [facility] on 09/10/14 for GI bleeding.  Advised per care everywhere discharge note in Epic pt was to f/u with his primary MD, no need to see GI unless primary MD wanted him to f/u with GI.  Also states for pt to continue to hold Warfarin until primary MD ok with restart.  Dr Sanda Klein gave verbal ok to resume Warfarin since no further bleeding episodes s/p hospitalization.  She wants INR goal lowered to 2.0-2.5.

## 2014-09-27 NOTE — Telephone Encounter (Signed)
Called spoke with pt, advised per Dr Sanda Klein OK to resume Warfarin.  Advised of INR goal change of 2.0-2.5, therefor asked pt to resume Coumadin at a slightly lower dosage than previously taking.  Asked pt to start taking 1 tablet daily except 1/2 tablet on Mondays, Wednesdays, and Saturdays.  Made a f/u appt in 1 week on 10/04/14 here in [place] with me.  Pt verbalized understanding.  Will continue to monitor for any signs and symptoms of bleeding and call with problems.

## 2014-10-04 ENCOUNTER — Ambulatory Visit (INDEPENDENT_AMBULATORY_CARE_PROVIDER_SITE_OTHER): Payer: Commercial Managed Care - HMO

## 2014-10-04 DIAGNOSIS — I4891 Unspecified atrial fibrillation: Secondary | ICD-10-CM | POA: Diagnosis not present

## 2014-10-04 DIAGNOSIS — Z5181 Encounter for therapeutic drug level monitoring: Secondary | ICD-10-CM | POA: Diagnosis not present

## 2014-10-04 DIAGNOSIS — Z7901 Long term (current) use of anticoagulants: Secondary | ICD-10-CM | POA: Diagnosis not present

## 2014-10-04 LAB — POCT INR: INR: 1.2

## 2014-10-12 ENCOUNTER — Ambulatory Visit (INDEPENDENT_AMBULATORY_CARE_PROVIDER_SITE_OTHER): Payer: Commercial Managed Care - HMO

## 2014-10-12 ENCOUNTER — Ambulatory Visit (INDEPENDENT_AMBULATORY_CARE_PROVIDER_SITE_OTHER): Payer: Commercial Managed Care - HMO | Admitting: Cardiovascular Disease

## 2014-10-12 ENCOUNTER — Encounter: Payer: Self-pay | Admitting: Cardiovascular Disease

## 2014-10-12 VITALS — BP 120/58 | HR 74 | Ht 68.0 in | Wt 173.0 lb

## 2014-10-12 DIAGNOSIS — I10 Essential (primary) hypertension: Secondary | ICD-10-CM

## 2014-10-12 DIAGNOSIS — I4891 Unspecified atrial fibrillation: Secondary | ICD-10-CM | POA: Diagnosis not present

## 2014-10-12 DIAGNOSIS — Z7901 Long term (current) use of anticoagulants: Secondary | ICD-10-CM | POA: Diagnosis not present

## 2014-10-12 DIAGNOSIS — E785 Hyperlipidemia, unspecified: Secondary | ICD-10-CM | POA: Diagnosis not present

## 2014-10-12 DIAGNOSIS — K922 Gastrointestinal hemorrhage, unspecified: Secondary | ICD-10-CM

## 2014-10-12 DIAGNOSIS — Z5181 Encounter for therapeutic drug level monitoring: Secondary | ICD-10-CM

## 2014-10-12 DIAGNOSIS — N186 End stage renal disease: Secondary | ICD-10-CM | POA: Diagnosis not present

## 2014-10-12 LAB — POCT INR: INR: 2

## 2014-10-12 NOTE — Assessment & Plan Note (Signed)
History of renal transplant, he reports normal renal function.

## 2014-10-12 NOTE — Patient Instructions (Signed)

## 2014-10-12 NOTE — Assessment & Plan Note (Signed)
Blood pressure is well controlled on today's visit. No changes made to the medications. 

## 2014-10-12 NOTE — Assessment & Plan Note (Signed)
Prior diagnosis of hyperlipidemia. Most recent numbers from 2014 that we have available showed total cholesterol 165 on no medication We discussed this. No further medication needed at this time

## 2014-10-12 NOTE — Assessment & Plan Note (Signed)
Heart rate well controlled. He was restarted on his anticoagulation, tolerating this well so far Recommended he stop Coumadin for any recurrent lower GI bleeding and go to the hospital If he continues to have recurrent bleeds, may require repeat colonoscopy

## 2014-10-12 NOTE — Assessment & Plan Note (Signed)
Recent GI bleed, embolization, doing well for the past several weeks. We'll need to monitor closely

## 2014-10-12 NOTE — Progress Notes (Signed)
Patient ID: Matthew Brown, male    DOB: 16-Feb-1941, 74 y.o.   MRN: [id number]  HPI Comments: [name] is a 74 year old male with a history of chronic atrial fibrillation and flutter with a normal ejection fraction,   negative functional study in [place] in 2006, end-stage renal disease status post kidney transplant in May 2006, with normal creatinine, hypertension, and gastroesophageal reflux disease. Last seen in the clinic in 2012. Presenting to the office today for follow-up after recent hospitalization at [facility] for lower GI bleed.  Notes reviewed from care everywhere. He had bright red blood per rectum which presented acutely. He went to [facility], had a tagged nuclear blood scan that showed active bleeding. He then underwent catheter assisted embolization of artery to the colon with successful cessation of the bleeding. This was performed on 09/10/2014 Notes from [facility] indicate that they did not feel GI follow-up was indicated.  He has since been restarted on his anticoagulation by primary care, Dr Sanda Klein. Goal of his INR lowered down to 2.0-2.5 He denies any further bleeding.  He does report having abdominal bloating. This was also present prior to his recent admission to [facility].  He reports his kidney doctor decreased his Lasix dosing from 120 mg down to 60 mg. Since this change was made, he has had worsening leg edema. Unclear if this has made his abdominal bloating worse He denies any shortness of breath or chest pressure. When he walks he does appreciate his abdominal bloating   He does report having prior colonoscopy  EKG on today's visit shows atrial fibrillation with ventricular rate 77 bpm, left axis deviation, unable to exclude old anterior MI    No Known Allergies  Outpatient Encounter Prescriptions as of 10/12/2014  Medication Sig  . allopurinol (ZYLOPRIM) 100 MG tablet Take 100 mg by mouth daily.    . cinacalcet (SENSIPAR) 30 MG tablet Take 15 mg by mouth daily.    .  enalapril (VASOTEC) 10 MG tablet Take 10 mg by mouth 2 (two) times daily.    [name] esomeprazole (NEXIUM) 40 MG capsule Take 40 mg by mouth daily before breakfast.    . finasteride (PROSCAR) 5 MG tablet Take 5 mg by mouth daily.    . furosemide (LASIX) 20 MG tablet Take 60 mg by mouth daily.   . metoprolol (TOPROL-XL) 200 MG 24 hr tablet Take 200 mg by mouth 2 (two) times daily.    . Multiple Vitamin (MULTIVITAMIN) tablet Take 1 tablet by mouth daily.    . mycophenolate (MYFORTIC) 360 MG TBEC Take 360 mg by mouth 2 (two) times daily.    [name] oxybutynin (DITROPAN-XL) 5 MG 24 hr tablet Take 5 mg by mouth daily.    . predniSONE (DELTASONE) 10 MG tablet Take 10 mg by mouth daily.  . tacrolimus (PROGRAF) 1 MG capsule Take 1 mg by mouth 3 (three) times daily.    . Tamsulosin HCl (FLOMAX) 0.4 MG CAPS Take 0.4 mg by mouth daily.    [name] warfarin (COUMADIN) 4 MG tablet Take as directed by coumadin clinic  . [DISCONTINUED] aspirin 81 MG EC tablet Take 81 mg by mouth daily.      Past Medical History  Diagnosis Date  . Atrial fibrillation   . Hyperlipidemia   . Benign prostatic hypertrophy   . GERD (gastroesophageal reflux disease)   . Osteoarthrosis, unspecified whether generalized or localized, lower leg   . Hypertension   . End stage renal failure on dialysis   .  DJD (degenerative joint disease) of knee     History reviewed. No pertinent past surgical history.  Social History  reports that he quit smoking about 36 years ago. He has never used smokeless tobacco. He reports that he drinks alcohol. He reports that he does not use illicit drugs.  Family History Family history is unknown by patient.  Review of Systems  Constitutional: Negative.   Respiratory: Negative.   Cardiovascular: Positive for leg swelling.  Gastrointestinal: Positive for abdominal distention.  Endocrine: Negative.   Musculoskeletal: Negative.   Skin: Negative.   Neurological: Negative.   Hematological: Negative.    Psychiatric/Behavioral: Negative.   All other systems reviewed and are negative.   BP 120/58 mmHg  Pulse 74  Ht 5\' 8"  (1.727 m)  Wt 173 lb (78.472 kg)  BMI 26.31 kg/m2  Physical Exam  Constitutional: He is oriented to person, place, and time. He appears well-developed and well-nourished.  HENT:  Head: Normocephalic.  Nose: Nose normal.  Mouth/Throat: Oropharynx is clear and moist.  Eyes: Conjunctivae are normal. Pupils are equal, round, and reactive to light.  Neck: Normal range of motion. Neck supple. No JVD present.  Cardiovascular: Normal rate, S1 normal, S2 normal, normal heart sounds and intact distal pulses.  An irregularly irregular rhythm present. Exam reveals no gallop and no friction rub.   No murmur heard. Pulmonary/Chest: Effort normal and breath sounds normal. No respiratory distress. He has no wheezes. He has no rales. He exhibits no tenderness.  Abdominal: Soft. Bowel sounds are normal. He exhibits no distension. There is no tenderness.  Musculoskeletal: Normal range of motion. He exhibits no edema or tenderness.  Lymphadenopathy:    He has no cervical adenopathy.  Neurological: He is alert and oriented to person, place, and time. Coordination normal.  Skin: Skin is warm and dry. No rash noted. No erythema.  Psychiatric: He has a normal mood and affect. His behavior is normal. Judgment and thought content normal.      Assessment and Plan   Nursing note and vitals reviewed.

## 2014-10-26 ENCOUNTER — Other Ambulatory Visit: Payer: Self-pay | Admitting: Cardiovascular Disease

## 2014-10-26 DIAGNOSIS — I4891 Unspecified atrial fibrillation: Secondary | ICD-10-CM

## 2014-11-02 ENCOUNTER — Ambulatory Visit (INDEPENDENT_AMBULATORY_CARE_PROVIDER_SITE_OTHER): Payer: Commercial Managed Care - HMO

## 2014-11-02 ENCOUNTER — Ambulatory Visit (INDEPENDENT_AMBULATORY_CARE_PROVIDER_SITE_OTHER): Payer: Commercial Managed Care - HMO | Admitting: Pharmacist

## 2014-11-02 ENCOUNTER — Other Ambulatory Visit: Payer: Self-pay

## 2014-11-02 DIAGNOSIS — Z5181 Encounter for therapeutic drug level monitoring: Secondary | ICD-10-CM

## 2014-11-02 DIAGNOSIS — Z7901 Long term (current) use of anticoagulants: Secondary | ICD-10-CM

## 2014-11-02 DIAGNOSIS — I4891 Unspecified atrial fibrillation: Secondary | ICD-10-CM | POA: Diagnosis not present

## 2014-11-02 LAB — POCT INR: INR: 1.8

## 2014-11-17 ENCOUNTER — Telehealth: Payer: Self-pay | Admitting: Cardiovascular Disease

## 2014-11-17 NOTE — Telephone Encounter (Signed)
ECHO faxed to [facility]. True @ [phone]. Pt is appreciative.

## 2014-11-17 NOTE — Telephone Encounter (Signed)
Patient had echo and wanted results faxed to care coordinator at [facility] Patient is waiting for them to get results so he can up his fluid pill  Patient wants to make sure this was faxed to [facility] before he calls them.

## 2014-11-22 ENCOUNTER — Ambulatory Visit (INDEPENDENT_AMBULATORY_CARE_PROVIDER_SITE_OTHER): Payer: Commercial Managed Care - HMO

## 2014-11-22 DIAGNOSIS — I4891 Unspecified atrial fibrillation: Secondary | ICD-10-CM | POA: Diagnosis not present

## 2014-11-22 DIAGNOSIS — Z7901 Long term (current) use of anticoagulants: Secondary | ICD-10-CM

## 2014-11-22 DIAGNOSIS — Z5181 Encounter for therapeutic drug level monitoring: Secondary | ICD-10-CM

## 2014-11-22 LAB — POCT INR: INR: 1.6

## 2014-11-29 ENCOUNTER — Ambulatory Visit: Payer: Commercial Managed Care - HMO | Admitting: Dietician

## 2014-12-06 ENCOUNTER — Ambulatory Visit (INDEPENDENT_AMBULATORY_CARE_PROVIDER_SITE_OTHER): Payer: Commercial Managed Care - HMO

## 2014-12-06 DIAGNOSIS — Z7901 Long term (current) use of anticoagulants: Secondary | ICD-10-CM

## 2014-12-06 DIAGNOSIS — Z5181 Encounter for therapeutic drug level monitoring: Secondary | ICD-10-CM

## 2014-12-06 DIAGNOSIS — I4891 Unspecified atrial fibrillation: Secondary | ICD-10-CM | POA: Diagnosis not present

## 2014-12-06 LAB — POCT INR: INR: 2.2

## 2014-12-15 ENCOUNTER — Telehealth: Payer: Self-pay

## 2014-12-15 NOTE — Telephone Encounter (Signed)
ECHO was faxed to [facility]. True @ [phone] on 11/17/14. Looks like [facility] nephrology is adjusting pt's lasix, as change from 60 mg BID was changed to 60 mg am and 40 mg pm at visit w/ Dr. Suzan Nailer on 5/27.

## 2014-12-15 NOTE — Telephone Encounter (Signed)
Echocardiogram was faxed to his office at [facility]  Only thing we can do is perform a procedure to measure the pressures in his heart to confirm the echocardiogram  This is a right heart catheterization, could be done at [place] thing would be for him to call [facility] renal Dr. And ask that they review the echocardiogram   typically in these situations, we do the invasive procedure, right heart catheterization which is very easy, to confirm the heart pressures   we did not adjust his Lasix, would prefer renal service do this given his renal transplant.

## 2014-12-15 NOTE — Telephone Encounter (Signed)
Spoke w/ pt.  Advised him of Dr. Donivan Scull recommendation.  He verbalizes understanding and wanted to ensure that his providers' were on the same page.

## 2014-12-15 NOTE — Telephone Encounter (Signed)
Pt states we were supposed to fax a note to his Kidney Dr. stating his "fluid pills are to be moved up", but his pharmacist has not increased, but decreased. Please call.

## 2014-12-19 ENCOUNTER — Encounter: Payer: Self-pay | Admitting: Unknown Physician Specialty

## 2014-12-19 ENCOUNTER — Ambulatory Visit (INDEPENDENT_AMBULATORY_CARE_PROVIDER_SITE_OTHER): Payer: Commercial Managed Care - HMO | Admitting: Unknown Physician Specialty

## 2014-12-19 VITALS — BP 150/70 | HR 70 | Temp 97.6°F | Ht 66.8 in | Wt 166.8 lb

## 2014-12-19 DIAGNOSIS — M179 Osteoarthritis of knee, unspecified: Secondary | ICD-10-CM

## 2014-12-19 DIAGNOSIS — M1710 Unilateral primary osteoarthritis, unspecified knee: Secondary | ICD-10-CM

## 2014-12-19 DIAGNOSIS — IMO0002 Reserved for concepts with insufficient information to code with codable children: Secondary | ICD-10-CM

## 2014-12-19 MED ORDER — METHYLPREDNISOLONE 4 MG PO TBPK: ORAL_TABLET | ORAL | Status: DC

## 2014-12-19 NOTE — Progress Notes (Signed)
   BP 150/70 mmHg  Pulse 70  Temp(Src) 97.6 F (36.4 C)  Ht 5' 6.8" (1.697 m)  Wt 166 lb 12.8 oz (75.66 kg)  BMI 26.27 kg/m2  SpO2 98%   Subjective:    Patient ID: [name], male    DOB: 03-Dec-1940, 74 y.o.   MRN: [id number]  HPI: [name] HEITZ is a 74 y.o. male  Chief Complaint  Patient presents with  . Knee Pain    pt states pain is in left knee and has been there for over a week now   Pain in left knee.  States he took gout medication without any benefit.  Points to the top and above knee to show me where it hurts.  Ice seems to help.  Not sure what makes it worse there than when takes the ice off and leaves it off for a while.    Relevant past medical, surgical, family and social history reviewed and updated as indicated. Interim medical history since our last visit reviewed. Allergies and medications reviewed and updated.  Review of Systems  Per HPI unless specifically indicated above     Objective:    BP 150/70 mmHg  Pulse 70  Temp(Src) 97.6 F (36.4 C)  Ht 5' 6.8" (1.697 m)  Wt 166 lb 12.8 oz (75.66 kg)  BMI 26.27 kg/m2  SpO2 98%  Wt Readings from Last 3 Encounters:  12/19/14 166 lb 12.8 oz (75.66 kg)  11/07/14 169 lb (76.658 kg)  10/12/14 173 lb (78.472 kg)    Physical Exam  Musculoskeletal:       Left knee: He exhibits decreased range of motion, swelling, effusion and erythema. No tenderness found. No medial joint line, no lateral joint line, no MCL, no LCL and no patellar tendon tenderness noted.    Results for orders placed or performed in visit on 12/06/14  POCT INR  Result Value Ref Range   INR 2.2       Assessment & Plan:   Problem List Items Addressed This Visit      Musculoskeletal and Integument   Osteoarthrosis, unspecified whether generalized or localized, involving lower leg - Primary    Pt with knee pain OA vs gout.  Pt would like an injection.  Discussed with Dr. Wynetta Emery and will rx a Medrol Dose Pack to r/o gout.         Relevant Medications   methylPREDNISolone (MEDROL DOSEPAK) 4 MG TBPK tablet       Follow up plan: Return if symptoms worsen or fail to improve.   Will use injection if no improvement.

## 2014-12-19 NOTE — Assessment & Plan Note (Signed)
Pt with knee pain OA vs gout.  Pt would like an injection.  Discussed with Dr. Wynetta Emery and will rx a Medrol Dose Pack to r/o gout.

## 2014-12-27 ENCOUNTER — Telehealth: Payer: Self-pay | Admitting: Family Medicine

## 2014-12-27 NOTE — Telephone Encounter (Signed)
Pt called and said medication prescribed on the last visit for his knee isnt working and he would like something else. Pt would like it to talk about getting a shot.

## 2014-12-27 NOTE — Telephone Encounter (Signed)
Routing to provider. Dr. Sanda Klein, patient saw Malachy Mood last week and she discussed injecting his knee but she decided not to. She mentions at the end of her note that she might inject it if no improvement. I'm pretty sure you don't inject knees.

## 2014-12-27 NOTE — Telephone Encounter (Signed)
Please schedule an appt with Malachy Mood when she returns if he wants her to give her a knee injection; I would not be inclined to do that If he thinks he needs something right away, then I'll suggest referral to ortho (okay to put that in if he desires that instead)

## 2014-12-27 NOTE — Telephone Encounter (Signed)
PT SAID HE SAW BOTH LADA AND WICKER ABOUT HIS KNEE ON HIS VISIT LAST WEEK AND  HE WOULD TO TALK  SOMEONE REGARDING GETTING A SHOT IN HIS KNEE INSTEAD

## 2014-12-28 NOTE — Telephone Encounter (Signed)
No answer on cell phone. Called home phone and his wife stated she was on a long distance phone call.

## 2014-12-29 NOTE — Telephone Encounter (Signed)
Patient notified, he already has an appointment scheduled for July 20th at [facility] for his knee.

## 2015-01-03 ENCOUNTER — Ambulatory Visit (INDEPENDENT_AMBULATORY_CARE_PROVIDER_SITE_OTHER): Payer: Commercial Managed Care - HMO

## 2015-01-03 DIAGNOSIS — Z5181 Encounter for therapeutic drug level monitoring: Secondary | ICD-10-CM

## 2015-01-03 DIAGNOSIS — Z7901 Long term (current) use of anticoagulants: Secondary | ICD-10-CM | POA: Diagnosis not present

## 2015-01-03 DIAGNOSIS — I4891 Unspecified atrial fibrillation: Secondary | ICD-10-CM

## 2015-01-03 LAB — POCT INR: INR: 3.2

## 2015-01-24 ENCOUNTER — Ambulatory Visit (INDEPENDENT_AMBULATORY_CARE_PROVIDER_SITE_OTHER): Payer: Commercial Managed Care - HMO | Admitting: *Deleted

## 2015-01-24 DIAGNOSIS — Z5181 Encounter for therapeutic drug level monitoring: Secondary | ICD-10-CM | POA: Diagnosis not present

## 2015-01-24 DIAGNOSIS — I4891 Unspecified atrial fibrillation: Secondary | ICD-10-CM | POA: Diagnosis not present

## 2015-01-24 DIAGNOSIS — Z7901 Long term (current) use of anticoagulants: Secondary | ICD-10-CM | POA: Diagnosis not present

## 2015-01-24 LAB — POCT INR: INR: 2.5

## 2015-01-31 ENCOUNTER — Other Ambulatory Visit: Payer: Self-pay | Admitting: Cardiovascular Disease

## 2015-01-31 NOTE — Telephone Encounter (Signed)
Please review for refill, Thank you. 

## 2015-02-15 ENCOUNTER — Other Ambulatory Visit: Payer: Self-pay | Admitting: Cardiovascular Disease

## 2015-02-15 NOTE — Telephone Encounter (Signed)
Please review for refill. Thanks!  

## 2015-02-21 ENCOUNTER — Ambulatory Visit (INDEPENDENT_AMBULATORY_CARE_PROVIDER_SITE_OTHER): Payer: Commercial Managed Care - HMO

## 2015-02-21 DIAGNOSIS — Z7901 Long term (current) use of anticoagulants: Secondary | ICD-10-CM | POA: Diagnosis not present

## 2015-02-21 DIAGNOSIS — Z5181 Encounter for therapeutic drug level monitoring: Secondary | ICD-10-CM

## 2015-02-21 DIAGNOSIS — I4891 Unspecified atrial fibrillation: Secondary | ICD-10-CM

## 2015-02-21 LAB — POCT INR: INR: 3.7

## 2015-03-04 ENCOUNTER — Telehealth: Payer: Self-pay | Admitting: Physician Assistant

## 2015-03-04 NOTE — Telephone Encounter (Signed)
    Patient called after hours on call service to report he is not feeling well and had abdominal pain and cough. No CP or SOB, lightheadedness or dizziness. I explained that his problems didn't sound cardiac but that he may want to be seen today if he is in severe enough pain. I recommended that he be seen in the emergency department if very uncomfortable or his PCP on Monday if he felt okay. He agreed.   Angelena Form PA-C  [facility]

## 2015-03-13 HISTORY — PX: ESOPHAGOGASTRODUODENOSCOPY: SHX1529

## 2015-03-16 DIAGNOSIS — J14 Pneumonia due to Hemophilus influenzae: Secondary | ICD-10-CM | POA: Insufficient documentation

## 2015-03-21 ENCOUNTER — Telehealth: Payer: Self-pay

## 2015-03-21 NOTE — Telephone Encounter (Signed)
Attempted to call pt, overdue for Coumadin follow-up.  Spoke with pt's wife she states pt in currently admitted at [place] admitted with [facility] 3 weeks ago.  She states pt is doing better and is currently awaiting discharge to rehab.  Will postpone pt's follow-up in clinic given he is in hospital and going to rehab.  Advised pt's wife to call once pt is back home from hospital/rehab and arrange follow-up in Coumadin clinic.

## 2015-04-04 DIAGNOSIS — R1084 Generalized abdominal pain: Secondary | ICD-10-CM | POA: Insufficient documentation

## 2015-04-05 DIAGNOSIS — K859 Acute pancreatitis without necrosis or infection, unspecified: Secondary | ICD-10-CM | POA: Insufficient documentation

## 2015-04-07 DIAGNOSIS — J9600 Acute respiratory failure, unspecified whether with hypoxia or hypercapnia: Secondary | ICD-10-CM | POA: Insufficient documentation

## 2015-04-07 DIAGNOSIS — K2980 Duodenitis without bleeding: Secondary | ICD-10-CM | POA: Insufficient documentation

## 2015-04-07 DIAGNOSIS — J90 Pleural effusion, not elsewhere classified: Secondary | ICD-10-CM | POA: Insufficient documentation

## 2015-05-16 ENCOUNTER — Ambulatory Visit: Payer: Commercial Managed Care - HMO | Admitting: Family Medicine

## 2015-05-21 ENCOUNTER — Telehealth: Payer: Self-pay

## 2015-05-21 NOTE — Telephone Encounter (Signed)
Nurse with [facility] went to see Matthew Brown on Friday. He had rales in his right lung base. No cough, no fever, no abnormal fatigue. Wanted to let us know.

## 2015-05-21 NOTE — Telephone Encounter (Signed)
Can we check in and see how he's doing? If not feeling well, should go to the ER, otherwise should be seen for rales this week

## 2015-05-22 NOTE — Telephone Encounter (Signed)
Patient states he is doing well. He said the nurse told him his lungs sounded clear. He said he's not having any coughing, wheezing, or congestion. He does have af/u appt with [place] on Thursday.

## 2015-05-24 ENCOUNTER — Ambulatory Visit: Payer: Commercial Managed Care - HMO | Admitting: Family Medicine

## 2015-05-25 ENCOUNTER — Telehealth: Payer: Self-pay | Admitting: Family Medicine

## 2015-05-25 NOTE — Telephone Encounter (Signed)
Pt came in thought his appt was today. Wanted to notify Dr. Sanda Klein that he has 2 new medications. They are as follows:  Klor-Con M20 tablet - takes 2 tablets by mouth once daily for 3 days.  Sucralfate 1 GM tablet- take 1 tablet (1 G total) by mouth four times a day.   Please call pt with any questions. Thanks.

## 2015-05-25 NOTE — Telephone Encounter (Signed)
Dr. Sanda Klein, just an FYI. I added the new medicines in.

## 2015-05-29 ENCOUNTER — Other Ambulatory Visit: Payer: Self-pay | Admitting: *Deleted

## 2015-05-29 NOTE — Patient Outreach (Signed)
[facility]) Care Management  05/29/2015  Matthew Brown [birth year] [id number]   [employer] HMO Tier 4 list:  Telephone call to patient; HIPPA verification received. Patient advised of reason for call & of [facility] care management services. States unable to talk now. States he has stomach concerns and had kidney transplant in 2006.  States he has doctor's appointment on Friday and prefers to talk to me after appointment.     Plan:  Will follow up. Patient agrees with set appointment. Sherrin Daisy, RN BSN [place] Management Coordinator [facility] Care Management  [phone]

## 2015-05-31 ENCOUNTER — Telehealth: Payer: Self-pay

## 2015-05-31 NOTE — Telephone Encounter (Signed)
Webb Silversmith from [facility] Management called would like the nurse to ask the pt to contact her. Thanks.

## 2015-06-01 ENCOUNTER — Ambulatory Visit: Payer: Commercial Managed Care - HMO | Admitting: *Deleted

## 2015-06-01 ENCOUNTER — Ambulatory Visit: Payer: Commercial Managed Care - HMO | Admitting: Family Medicine

## 2015-06-01 NOTE — Telephone Encounter (Signed)
Called Webb Silversmith to clarify what exactly she is needing me to do. I left her a voicemail and asked for her to please give me a call back.

## 2015-06-01 NOTE — Telephone Encounter (Signed)
Patient is seeing Dr. Sanda Klein Monday. [name] asked for [place] to verbally tell the patient to give her call when he comes in for his appointment.

## 2015-06-04 ENCOUNTER — Telehealth: Payer: Self-pay

## 2015-06-04 NOTE — Telephone Encounter (Signed)
Patient's appt is Wed.

## 2015-06-04 NOTE — Telephone Encounter (Signed)
Mariel Kansky ay Wishek called to let us know that he was discharged from home health.

## 2015-06-05 ENCOUNTER — Encounter: Payer: Self-pay | Admitting: *Deleted

## 2015-06-05 ENCOUNTER — Other Ambulatory Visit: Payer: Commercial Managed Care - HMO | Admitting: *Deleted

## 2015-06-05 NOTE — Patient Outreach (Signed)
[facility]) Care Management  06/05/2015  Matthew Brown [birth year] [id number]   Telephone call  to patient who was advised of reason for call and of [facility] care management services. Patients states currently he is independent and is able to care for himself. States has friend to help out if there is a need. Drives himself to doctors appointments without problems. Manages own medications and states he takes medications as prescribed by his doctors. States he uses mail order and local pharmacy for medications and has no problems getting prescriptions filled. States up to date on vaccinations such as flu and pneumonia.   Patient voices that he has no needs for [facility] case/disease management services. He thanked Therapist, sports CM for calling.  Patient states he will accept literature & contact information from [facility] care management.  Plan: Send MD closure letter. Send [facility] literature to patient. Send to care management assistant to close out. Sherrin Daisy, RN BSN [place] Management Coordinator [facility] Care Management  [phone]

## 2015-06-06 ENCOUNTER — Ambulatory Visit
Admission: RE | Admit: 2015-06-06 | Discharge: 2015-06-06 | Disposition: A | Discharge status: Home / Self Care | Payer: Commercial Managed Care - HMO | Source: Ambulatory Visit | Attending: Family Medicine | Admitting: Family Medicine

## 2015-06-06 ENCOUNTER — Ambulatory Visit (INDEPENDENT_AMBULATORY_CARE_PROVIDER_SITE_OTHER): Payer: Commercial Managed Care - HMO | Admitting: Family Medicine

## 2015-06-06 ENCOUNTER — Ambulatory Visit: Payer: Self-pay

## 2015-06-06 ENCOUNTER — Encounter: Payer: Self-pay | Admitting: Family Medicine

## 2015-06-06 VITALS — BP 148/63 | HR 55 | Temp 97.9°F | Wt 160.0 lb

## 2015-06-06 DIAGNOSIS — Z8719 Personal history of other diseases of the digestive system: Secondary | ICD-10-CM | POA: Diagnosis not present

## 2015-06-06 DIAGNOSIS — Z5181 Encounter for therapeutic drug level monitoring: Secondary | ICD-10-CM

## 2015-06-06 DIAGNOSIS — H6982 Other specified disorders of Eustachian tube, left ear: Secondary | ICD-10-CM | POA: Insufficient documentation

## 2015-06-06 DIAGNOSIS — F5104 Psychophysiologic insomnia: Secondary | ICD-10-CM

## 2015-06-06 DIAGNOSIS — I4891 Unspecified atrial fibrillation: Secondary | ICD-10-CM

## 2015-06-06 DIAGNOSIS — J189 Pneumonia, unspecified organism: Secondary | ICD-10-CM | POA: Insufficient documentation

## 2015-06-06 DIAGNOSIS — G4700 Insomnia, unspecified: Secondary | ICD-10-CM | POA: Diagnosis not present

## 2015-06-06 DIAGNOSIS — H6983 Other specified disorders of Eustachian tube, bilateral: Secondary | ICD-10-CM

## 2015-06-06 NOTE — Assessment & Plan Note (Signed)
With chronic sinus drainage; symptoms not resolving; bothersome to patient; will refer to ENT at his request; he will let us know where he would like to go, but I entered the referral today

## 2015-06-06 NOTE — Assessment & Plan Note (Signed)
Hospitalized at [facility] Sept 2016; patient reports still having dysphagia and symptoms in the epigastric area; I encouraged him to contact his GI doctor right away so they can be aware of his symptoms; may be developing scarring, narrowing, recurrence of ulcer, etc.; he denies bleeding today

## 2015-06-06 NOTE — Assessment & Plan Note (Signed)
Secondary to H influenza; Sept 2016 hospital course reviewed in [place]; he has some posterior rales so will get CXR today

## 2015-06-06 NOTE — Assessment & Plan Note (Signed)
It sounds like this stems for incidences during his childhood when he would listen to his parents argue at night while his siblings slept; I suggested rather than medicine, to have him start working with a therapist to address and deal with this unresolved issues; he and his wife agree; list of local therapists provided
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
## 2015-06-06 NOTE — Patient Instructions (Addendum)
Please do call your GI doctor about your stomach and your food sticking Try limit beans, bread, broccoli, and bagels to see if that helps your abdomen Do consider working with a therapist about your sleeping pattern Please do go to [facility] at your convenience for the chest xray, no appointment needed

## 2015-06-06 NOTE — Telephone Encounter (Signed)
Patient came in for appt and contact message given.

## 2015-06-07 ENCOUNTER — Telehealth: Payer: Self-pay | Admitting: Family Medicine

## 2015-06-07 NOTE — Telephone Encounter (Signed)
I talked to patient about CXR results He says he is actually feeling better Will get f/u CXR in 2 weeks AMY--- please call Antony Blackbird [phone] and ask her to get Dr. Kellie Shropshire True to the phone for me to go over his CXR together; see if she can pull up his [facility] xray and our xray to compare

## 2015-06-10 ENCOUNTER — Other Ambulatory Visit: Payer: Self-pay | Admitting: Family Medicine

## 2015-06-10 DIAGNOSIS — J189 Pneumonia, unspecified organism: Secondary | ICD-10-CM

## 2015-06-10 NOTE — Assessment & Plan Note (Signed)
Follow-up CXR Dec 29th or 30th

## 2015-06-10 NOTE — Telephone Encounter (Signed)
I talked to patient, but our connection was bad on his cell I called his home number and that a better call He is using his inhaler He is not coughing much at all, just coughing up very little and just white  No fever He is feeling better and better over the last few weeks; appetite is back real good I explained that I did not talk to Dr. Suzan Nailer on Friday; I will send this message to my staff He called his doctor's office at [facility] Friday like we discussed but he did not hear back from her If he gets worse, has trouble breathing, worsening cough, fever, etc, go to urgent care or [facility]; he agrees Let's recheck PA and lat CXR around Dec 29th or 30th to ensure resolution -------------------- AMY -- Please see below; I need to speak with Dr. Suzan Nailer please; call her before 9:00 am Monday Dec 19th so we can catch each other

## 2015-06-11 NOTE — Telephone Encounter (Signed)
Dr. Sanda Klein spoke with patient, he finally got in touch with Dr. Suzan Nailer.

## 2015-06-15 ENCOUNTER — Telehealth: Payer: Self-pay | Admitting: Family Medicine

## 2015-06-15 DIAGNOSIS — J189 Pneumonia, unspecified organism: Secondary | ICD-10-CM

## 2015-06-15 DIAGNOSIS — M79644 Pain in right finger(s): Secondary | ICD-10-CM | POA: Insufficient documentation

## 2015-06-15 NOTE — Telephone Encounter (Signed)
I called patient; he wanted to ask if he could get an xray of his pinky finger next week Little pinky on his right hand, he isn't sure if he broke it or what Offered to have him see urgent care, get seen soon RIGHT hand, little finger; swollen and painful; he declined pain medicine He says he can wait until next week, really just wanted to see if he could get an xray of it when he gets his chest xray Of course, that's fine, again offered to have these done sooner; I can send xray order to hospital; he declined and will wait until Tuesday ------------------- I entered xrays, could not see to release the one xray of finger, so I entered a second and released it; also released CXR

## 2015-06-15 NOTE — Telephone Encounter (Signed)
Forward to provider

## 2015-06-15 NOTE — Telephone Encounter (Signed)
Pt would like to discuss his xrays

## 2015-06-21 ENCOUNTER — Encounter: Payer: Self-pay | Admitting: Nurse Practitioner

## 2015-06-21 ENCOUNTER — Ambulatory Visit (INDEPENDENT_AMBULATORY_CARE_PROVIDER_SITE_OTHER): Payer: Commercial Managed Care - HMO | Admitting: Nurse Practitioner

## 2015-06-21 VITALS — BP 130/50 | HR 52 | Ht 68.0 in | Wt 163.8 lb

## 2015-06-21 DIAGNOSIS — I4819 Other persistent atrial fibrillation: Secondary | ICD-10-CM

## 2015-06-21 DIAGNOSIS — I481 Persistent atrial fibrillation: Secondary | ICD-10-CM

## 2015-06-21 DIAGNOSIS — I10 Essential (primary) hypertension: Secondary | ICD-10-CM | POA: Diagnosis not present

## 2015-06-21 DIAGNOSIS — K922 Gastrointestinal hemorrhage, unspecified: Secondary | ICD-10-CM | POA: Diagnosis not present

## 2015-06-21 DIAGNOSIS — I4891 Unspecified atrial fibrillation: Secondary | ICD-10-CM | POA: Diagnosis not present

## 2015-06-21 NOTE — Patient Instructions (Signed)
Medication Instructions:  Please continue your current medications  Labwork: None  Testing/Procedures: None  Follow-Up: 6 months w/ Dr. Rockey Situ  If you need a refill on your cardiac medications before your next appointment, please call your pharmacy.

## 2015-06-21 NOTE — Progress Notes (Signed)
Office Visit    Patient Name: Matthew Brown Date of Encounter: 06/21/2015  Primary Care Provider:  Enid Derry, MD Primary Cardiologist:  Johnny Bridge, MD   Chief Complaint    74 year old male with a history of persistent fibrillation who presents for follow-up.  Past Medical History    Past Medical History  Diagnosis Date  . Persistent atrial fibrillation ([place])     a. CHA2DS2VASc = 3-->coumadin d/c'd 02/2015 2/2 recurrent GIB.  [name] Hyperlipidemia   . Benign prostatic hypertrophy   . GERD (gastroesophageal reflux disease)   . Osteoarthrosis, unspecified whether generalized or localized, lower leg   . Essential hypertension   . End stage renal disease ([place])   . Renal transplant recipient   . GIB (gastrointestinal bleeding)     a. [id number] s/p R colic artery embolization;  b. 02/2015 EGD: duod ulcerative mass->Bx notable for coagulative necrosis - ? ischemia vs thrombosis-->coumadin d/c'd.  . Pulmonary hypertension ([place])     a. 10/2014 Echo: EF 60-65%, mild to mod MR, mildly dil LA, nl RV, PASP 65mmHg.   Past Surgical History  Procedure Laterality Date  . Esophagogastroduodenoscopy  03/13/15    severe esophagitis, ulcerated mass    Allergies  No Known Allergies  History of Present Illness    74 year old male with the above complex problem list. He has a history of persistent atrial fibrillation, end-stage renal disease status post transplant, and hypertension. In March of this year, he suffered lower GI bleeding with subsequent embolization of the right colic artery. Coumadin was subsequently resumed. He saw Dr. Rockey Situ in follow-up with echocardiography in May revealing significant pulmonary hyper-tension and volume excess. Lasix dosing was adjusted by his nephrologist.  In September of this year, he was admitted to [facility] with pneumonia and also hematemesis. EGD revealed a duodenal ulcerative mass with subsequent biopsy showing coagulative necrosis with question of ischemia versus  thrombosis. Coumadin was permanently discontinued at that point.  Patient says he's been doing reasonably well since then though it has been a long recovery. He is walking 30 minutes daily on his treadmill without chest pain or dyspnea. He is asymptomatic with regard to his atrial fibrillation. He did lose a fair amount of weight following his hospitalization in September and now that his appetite is improved, his weight is coming back on. With that, he has noted some increase in his lower extremity edema. He denies PND, orthopnea, dizziness, syncope, or early satiety.  Home Medications    Prior to Admission medications   Medication Sig Start Date End Date Taking? Authorizing Provider  albuterol (PROAIR HFA) 108 (90 BASE) MCG/ACT inhaler Inhale into the lungs. 02/20/14  Yes Historical Provider, MD  allopurinol (ZYLOPRIM) 100 MG tablet Take 100 mg by mouth daily.     Yes Historical Provider, MD  finasteride (PROSCAR) 5 MG tablet Take 5 mg by mouth daily.     Yes Historical Provider, MD  furosemide (LASIX) 20 MG tablet Take 80 mg by mouth daily.    Yes Historical Provider, MD  hydroxypropyl methylcellulose (ISOPTO TEARS) 2.5 % ophthalmic solution Place 1 drop into both eyes as needed.    Yes Historical Provider, MD  magnesium oxide (MAG-OX) 400 MG tablet Take 400 mg by mouth 2 (two) times daily.  09/12/14 09/12/15 Yes Historical Provider, MD  metoprolol (LOPRESSOR) 50 MG tablet Take 50 mg by mouth 2 (two) times daily.   Yes Historical Provider, MD  Multiple Vitamin (MULTIVITAMIN) tablet Take 1 tablet by mouth daily.  Yes Historical Provider, MD  mycophenolate (MYFORTIC) 180 MG EC tablet Take 2 tablets twice a day, V42.0 kidney transplant, tx date 11/05/04, DAW1, brand medically necessary 10/13/14  Yes Historical Provider, MD  omeprazole (PRILOSEC) 20 MG capsule Take 20 mg by mouth 2 (two) times daily before a meal.  12/19/14  Yes Historical Provider, MD  predniSONE (DELTASONE) 5 MG tablet Take 5 mg by  mouth daily with breakfast.  11/23/13  Yes Historical Provider, MD  sucralfate (CARAFATE) 1 G tablet Take 1 g by mouth 4 (four) times daily. 04/18/15  Yes Historical Provider, MD  tacrolimus (PROGRAF) 1 MG capsule Take 1 mg by mouth 3 (three) times daily.     Yes Historical Provider, MD  Tamsulosin HCl (FLOMAX) 0.4 MG CAPS Take 0.4 mg by mouth daily.     Yes Historical Provider, MD    Review of Systems    Overall, he has been recovering reasonably well. He walks 30 minutes a day and his treadmill. He has had some increase in weight gain as his appetite has improved and has also had small external edema.  He denies chest pain, palpitations, pnd, orthopnea, n, v, dizziness, syncope, edema, weight gain, or early satiety.  All other systems reviewed and are otherwise negative except as noted above.  Physical Exam    VS:  BP 130/50 mmHg  Pulse 52  Ht 5\' 8"  (1.727 m)  Wt 163 lb 12 oz (74.277 kg)  BMI 24.90 kg/m2 , BMI Body mass index is 24.9 kg/(m^2). GEN: Well nourished, well developed, in no acute distress. HEENT: normal. Neck: Supple, no JVD, carotid bruits, or masses. Cardiac: RRR, no rubs, or gallops. 2/6 systolic murmur at the left upper sternal border. No clubbing, cyanosis, 2+ lower extremity edema to the knees bilaterally.  Radials/DP/PT 2+ and equal bilaterally.  Respiratory:  Respirations regular and unlabored, diminished breath sounds on the right with bibasilar crackles. GI: Soft, nontender, nondistended, BS + x 4. MS: no deformity or atrophy. Skin: warm and dry, no rash. Neuro:  Strength and sensation are intact. Psych: Normal affect.  Accessory Clinical Findings    ECG - atrial fibrillation, 52, left axis, left anterior fascicular block, delayed R-wave progression, no acute ST or T changes.  Assessment & Plan    1.  Persistent atrial fibrillation: Patient is well rate controlled on beta blocker therapy. Unfortunately, after his second GI bleed this year, Coumadin was  discontinued in September. We discussed risks versus benefits of anticoagulation as well as management of atrial fibrillation. As he is asymptomatic, it is reasonable to continue with rate control. We also discussed alternatives anticoagulation including the watchman device. Patient would require adequate regulation for 6 weeks following that type of procedure and he wants to read some more about this and consider this further. He will contact us if he would like [place] to make a formal referral to electrophysiology for this.  2. Essential hypertension: Stable.  3. History GI bleed: No further bleeding/hematemesis. He is off of Coumadin.  4. End-stage renal disease: This is followed closely by nephrology at [facility]. His Lasix dose was reduced in the setting of poor appetite following hospitalization in September with GI bleeding. This has since improved and in that setting, he has put on some weight as well as Lotrimin edema. He is very careful with his sodium intake. He is going to call his transplant coordinator today to discuss increasing his diuretic dosing back to its previous dose of 60 mg in the morning  and 40 in the p.m. left (currently taking 80 in the morning only).  5. Disposition: He will contact us if he is interested in referral to EP for watchman consideration. Otherwise follow-up with Dr. Rockey Situ in 6 months or sooner if necessary.   Murray Hodgkins, NP 06/21/2015, 10:19 AM

## 2015-06-24 DIAGNOSIS — C8590 Non-Hodgkin lymphoma, unspecified, unspecified site: Secondary | ICD-10-CM

## 2015-06-24 HISTORY — DX: Non-Hodgkin lymphoma, unspecified, unspecified site: C85.90

## 2015-06-27 ENCOUNTER — Telehealth: Payer: Self-pay | Admitting: Family Medicine

## 2015-06-27 ENCOUNTER — Ambulatory Visit
Admission: RE | Admit: 2015-06-27 | Discharge: 2015-06-27 | Disposition: A | Discharge status: Home / Self Care | Payer: Commercial Managed Care - HMO | Source: Ambulatory Visit | Attending: Family Medicine | Admitting: Family Medicine

## 2015-06-27 DIAGNOSIS — I509 Heart failure, unspecified: Secondary | ICD-10-CM | POA: Insufficient documentation

## 2015-06-27 DIAGNOSIS — J189 Pneumonia, unspecified organism: Secondary | ICD-10-CM | POA: Diagnosis present

## 2015-06-27 NOTE — Telephone Encounter (Signed)
Matthew Brown from [facility] called to let Dr. lada know that Mr. Mutch did not want to do the xray on his finder. She was calling to let us know.

## 2015-06-27 NOTE — Telephone Encounter (Signed)
That's fine thank you

## 2015-07-18 DIAGNOSIS — R609 Edema, unspecified: Secondary | ICD-10-CM | POA: Insufficient documentation

## 2015-08-14 ENCOUNTER — Telehealth: Payer: Self-pay | Admitting: Family Medicine

## 2015-08-14 DIAGNOSIS — R39198 Other difficulties with micturition: Secondary | ICD-10-CM

## 2015-08-14 NOTE — Telephone Encounter (Signed)
Patient needs a new referral to [facility]--Patient hasn't been there since 2014 or 2015 and since they got a new computer system patient is no longer in the system.  Patient stated he called to make an appointment and he was told to call here and request a referral so they could attach record to record etc.  Patient said he was having some problems (prostate) and really needed to see his urologist asap.

## 2015-08-14 NOTE — Telephone Encounter (Signed)
I spoke with wife; patient was in the restroom; she says he is not urinating normally; if getting worse, go to ER Urology referral urgent for tomorrow

## 2015-08-14 NOTE — Telephone Encounter (Signed)
Patient called stating that he needs to talk to nurse regarding some questions he has, please call patient back, thank.

## 2015-08-15 ENCOUNTER — Telehealth: Payer: Self-pay | Admitting: Family Medicine

## 2015-08-15 NOTE — Telephone Encounter (Signed)
I received a staff message that patient was returning my call I spoke with patient; he did not see urology today; some issue with referral I put in the urgent referral yesterday, patient was to see urologist urgently today He is in pain; urinating but thinks it's his prostate; no fever Explained that I am concerned for his kidney, if prostate is enlarged, he may not be passing urine, he could have urine backing up, which could harm his kidney I am very worried about him and want him to go to the ER now; they can scan his bladder, see if retaining urine, may have bladder outlet obstruction, they can put in Foley, may leave it in so he can see urologist tomorrow Barth Kirks, office manager, notified and will follow-up with him tomorrow

## 2015-08-16 ENCOUNTER — Ambulatory Visit (INDEPENDENT_AMBULATORY_CARE_PROVIDER_SITE_OTHER): Payer: Commercial Managed Care - HMO | Admitting: Obstetrics and Gynecology

## 2015-08-16 ENCOUNTER — Encounter: Payer: Self-pay | Admitting: Obstetrics and Gynecology

## 2015-08-16 VITALS — BP 145/64 | HR 57 | Resp 16 | Ht 68.0 in | Wt 151.7 lb

## 2015-08-16 DIAGNOSIS — R339 Retention of urine, unspecified: Secondary | ICD-10-CM | POA: Diagnosis not present

## 2015-08-16 LAB — BLADDER SCAN AMB NON-IMAGING

## 2015-08-16 MED ORDER — SULFAMETHOXAZOLE-TRIMETHOPRIM 800-160 MG PO TABS
1.0000 | ORAL_TABLET | Freq: Every day | ORAL | Status: DC
Start: 2015-08-16 — End: 2015-09-03

## 2015-08-16 NOTE — Telephone Encounter (Signed)
Patient is scheduled for an appt at [facility] today at 3:30p.m..  Called to advise patient of appt.  Patient was thankful for the follow up and wanted to thank you (Dr. Sanda Klein) as well.

## 2015-08-16 NOTE — Progress Notes (Signed)
08/16/2015 9:32 AM   Matthew Brown 12/21/1940 [id number]  Referring provider: Arnetha Courser, MD [address] [place], [place] [address]  Chief Complaint  Patient presents with  . Abdominal Pain    lower right abdomen  . Urinary Retention  . Establish Care    HPI: Patient is a 75 year old African-American male with a history of renal failure status post kidney transplant 2006, BPH and recurrent prostatitis.  He is a previous patient of Dr. Vonita Moss. He presents today with complaint of right groin pain 3 weeks. He reports the pain is intermittent and waxes and wanes in severity. He states that this is typical symptom when he has bouts of prostatitis. He states that Dr. Madelin Headings is to massage his prostate in his symptoms were resolved. He has been taking Flomax and finasteride for many years for management of his BPH and denies any urinary symptoms today. No fevers, flank pain or gross hematuria.  No Browning of prostate cancer.   PMH: Past Medical History  Diagnosis Date  . Persistent atrial fibrillation ([place])     a. CHA2DS2VASc = 3-->coumadin d/c'd 02/2015 2/2 recurrent GIB.  [name] Hyperlipidemia   . Benign prostatic hypertrophy   . GERD (gastroesophageal reflux disease)   . Osteoarthrosis, unspecified whether generalized or localized, lower leg   . Essential hypertension   . End stage renal disease ([place])   . Renal transplant recipient   . GIB (gastrointestinal bleeding)     a. [id number] s/p R colic artery embolization;  b. 02/2015 EGD: duod ulcerative mass->Bx notable for coagulative necrosis - ? ischemia vs thrombosis-->coumadin d/c'd.  . Pulmonary hypertension ([place])     a. 10/2014 Echo: EF 60-65%, mild to mod MR, mildly dil LA, nl RV, PASP 61mmHg.  . Prostatitis   . ED (erectile dysfunction)     Surgical History: Past Surgical History  Procedure Laterality Date  . Esophagogastroduodenoscopy  03/13/15    severe esophagitis, ulcerated mass    Home Medications:    Medication List        This list is accurate as of: 08/16/15 11:59 PM.  Always use your most recent med list.               allopurinol 100 MG tablet  Commonly known as:  ZYLOPRIM  Take 100 mg by mouth daily.     finasteride 5 MG tablet  Commonly known as:  PROSCAR  Take 5 mg by mouth daily.     FLOMAX 0.4 MG Caps capsule  Generic drug:  tamsulosin  Take 0.4 mg by mouth daily.     furosemide 20 MG tablet  Commonly known as:  LASIX  Take 80 mg by mouth daily.     hydroxypropyl methylcellulose 2.5 % ophthalmic solution  Commonly known as:  ISOPTO TEARS  Place 1 drop into both eyes as needed.     magnesium oxide 400 (241.3 Mg) MG tablet  Commonly known as:  MAG-OX  TAKE 1 TABLET (400 MG TOTAL) BY MOUTH TWO (2) TIMES A DAY.     metoprolol tartrate 25 MG tablet  Commonly known as:  LOPRESSOR  Take 25 mg by mouth.     multivitamin tablet  Take 1 tablet by mouth daily.     MYFORTIC 180 MG EC tablet  Generic drug:  mycophenolate  Take 2 tablets twice a day, V42.0 kidney transplant, tx date 11/05/04, DAW1, brand medically necessary     omeprazole 20 MG capsule  Commonly known as:  PRILOSEC  Take 20 mg by mouth 2 (two) times daily before a meal.     predniSONE 5 MG tablet  Commonly known as:  DELTASONE  Take 5 mg by mouth daily with breakfast.     PROAIR HFA 108 (90 Base) MCG/ACT inhaler  Generic drug:  albuterol  Inhale into the lungs.     sucralfate 1 g tablet  Commonly known as:  CARAFATE  Take 1 g by mouth 4 (four) times daily.     sulfamethoxazole-trimethoprim 800-160 MG tablet  Commonly known as:  BACTRIM DS,SEPTRA DS  Take 1 tablet by mouth daily.     tacrolimus 1 MG capsule  Commonly known as:  PROGRAF  Take 1 mg by mouth 3 (three) times daily. Reported on 08/16/2015        Allergies: No Known Allergies  Family History: Family History  Problem Relation Age of Onset  . Cancer Mother     throat  . Diabetes Brother   . Heart disease Brother   . Stroke Brother   .  Hypertension Brother   . Diabetes Brother   . Diabetes Sister   . Heart disease Sister   . Hypertension Sister   . Diabetes Sister   . COPD Neg Hx     Social History:  reports that he quit smoking about 37 years ago. His smoking use included Cigarettes. He has a 25 pack-year smoking history. He has never used smokeless tobacco. He reports that he does not drink alcohol or use illicit drugs.  ROS: UROLOGY Frequent Urination?: Yes Hard to postpone urination?: No Burning/pain with urination?: No Get up at night to urinate?: Yes Leakage of urine?: No Urine stream starts and stops?: No Trouble starting stream?: No Do you have to strain to urinate?: No Blood in urine?: No Urinary tract infection?: No Sexually transmitted disease?: No Injury to kidneys or bladder?: No Painful intercourse?: No Weak stream?: No Erection problems?: Yes Penile pain?: No  Gastrointestinal Nausea?: No Vomiting?: No Indigestion/heartburn?: Yes Diarrhea?: Yes Constipation?: Yes  Constitutional Fever: No Night sweats?: No Weight loss?: Yes Fatigue?: No  Skin Skin rash/lesions?: No Itching?: No  Eyes Blurred vision?: No Double vision?: No  Ears/Nose/Throat Sore throat?: No Sinus problems?: Yes  Hematologic/Lymphatic Swollen glands?: No Easy bruising?: No  Cardiovascular Leg swelling?: Yes Chest pain?: No  Respiratory Cough?: Yes Shortness of breath?: No  Endocrine Excessive thirst?: No  Musculoskeletal Back pain?: Yes Joint pain?: Yes  Neurological Headaches?: No Dizziness?: No  Psychologic Depression?: No Anxiety?: No  Physical Exam: BP 145/64 mmHg  Pulse 57  Resp 16  Ht 5\' 8"  (1.727 m)  Wt 151 lb 11.2 oz (68.811 kg)  BMI 23.07 kg/m2  Constitutional:  Alert and oriented, No acute distress. HEENT: [place] AT, moist mucus membranes.  Trachea midline, no masses. Cardiovascular: No clubbing, cyanosis, or edema. Respiratory: Normal respiratory effort, no increased  work of breathing. GI: Abdomen is soft, nontender, nondistended, no abdominal masses GU: No CVA tenderness.  Small tender boggy prostate, testicles atrophic, normal uncircumcised phallus Skin: No rashes, bruises or suspicious lesions. Lymph: No cervical or inguinal adenopathy. Neurologic: Grossly intact, no focal deficits, moving all 4 extremities. Psychiatric: Normal mood and affect.  Laboratory Data:  No results found for: WBC, HGB, HCT, MCV, PLT  No results found for: CREATININE  No results found for: PSA  No results found for: TESTOSTERONE  No results found for: HGBA1C  Urinalysis Results for orders placed or performed in visit on 08/16/15  Microscopic Examination  Result Value Ref Range   WBC, UA 0-5 0 -  5 /hpf   RBC, UA None seen 0 -  2 /hpf   Epithelial Cells (non renal) None seen 0 - 10 /hpf   Bacteria, UA None seen None seen/Few  Urinalysis, Complete  Result Value Ref Range   Specific Gravity, UA 1.010 1.005 - 1.030   pH, UA 5.0 5.0 - 7.5   Color, UA Yellow Yellow   Appearance Ur Clear Clear   Leukocytes, UA Negative Negative   Protein, UA Negative Negative/Trace   Glucose, UA Negative Negative   Ketones, UA Negative Negative   RBC, UA Negative Negative   Bilirubin, UA Negative Negative   Urobilinogen, Ur 0.2 0.2 - 1.0 mg/dL   Nitrite, UA Negative Negative   Microscopic Examination See below:   BLADDER SCAN AMB NON-IMAGING  Result Value Ref Range   Scan Result 80 mL     Pertinent Imaging:   Assessment & Plan:    1. BPH- PVR minimal today.Continue Flomax and Finasteride. - BLADDER SCAN AMB NON-IMAGING  2. Prostatitis- Bactrim once daily (renal dosing) x 1 week.   3. ED- Leaving the Office Today Patient Mentioned That He Was Previously Prescribed Trymex Injections for Erectile Dysfunction. He Would like to Resume the Therapy. We Will Discuss This Further at His Next Visit.   Return in about 2 weeks (around 08/30/2015).  These notes generated with  voice recognition software. I apologize for typographical errors.  Herbert Moors, [facility] [address] [place], [place] [address] [phone]

## 2015-08-17 LAB — URINALYSIS, COMPLETE
Bilirubin, UA: NEGATIVE
Glucose, UA: NEGATIVE
Ketones, UA: NEGATIVE
LEUKOCYTES UA: NEGATIVE
Nitrite, UA: NEGATIVE
PH UA: 5 (ref 5.0–7.5)
PROTEIN UA: NEGATIVE
RBC UA: NEGATIVE
SPEC GRAV UA: 1.01 (ref 1.005–1.030)
Urobilinogen, Ur: 0.2 mg/dL (ref 0.2–1.0)

## 2015-08-17 LAB — MICROSCOPIC EXAMINATION
BACTERIA UA: NONE SEEN
EPITHELIAL CELLS (NON RENAL): NONE SEEN /HPF (ref 0–10)
RBC MICROSCOPIC, UA: NONE SEEN /HPF (ref 0–?)

## 2015-08-28 ENCOUNTER — Other Ambulatory Visit: Payer: Self-pay | Admitting: *Deleted

## 2015-08-28 NOTE — Patient Outreach (Signed)
[facility]) Care Management  08/28/2015  AKHILESH SPARR 01/17/1941 [id number]  Subjective: Telephone call to patient's home, spoke with patient, and HIPAA verified.  Discussed [facility] Care Management services and patient in agreement to receive services.   Patient states he is currently having prostate issues and is scheduled to see the MD in 2 weeks.   Patient states he is on his way out to pick up a friend, requested call back at 10:00am on 08/29/15 to complete telephone screening.  Objective: Per Epic case review: Patient has a history of hypertension,hyperlipidemia, renal failure, status post kidney transplant 2006 and urinary retention.  Patient refused [place] Management services on 06/05/15.    Assessment: Received [facility] HMO Tier 4 list referral on 08/14/15.   Diagnosis: Diabetes.  4 ED visits and 1 admit.     Plan:  RNCM will call patient within 1 business days  to complete telephone screen.  Chessa Barrasso H. Annia Friendly, BSN, [place] Management [facility] Telephonic CM Phone: [phone] Fax: [phone]

## 2015-08-29 ENCOUNTER — Other Ambulatory Visit: Payer: Self-pay | Admitting: *Deleted

## 2015-08-29 ENCOUNTER — Encounter: Payer: Self-pay | Admitting: *Deleted

## 2015-08-29 NOTE — Patient Outreach (Addendum)
[facility]) Care Management  08/29/2015  Matthew Brown 06-05-1941 [id number]  Subjective: Telephone call to patient's home number, no answer, unable to leave voice mail message due to voice mail not set up.  Telephone call to patient's mobile number, spoke with patient, and HIPAA verified.   States he is currently driving and requested call back this afternoon after 12:00pm.  Telephone call to patient's home number, no answer, unable to leave voice mail message due to voice mail not set up.  Telephone call to patient's mobile number, no answer, unable to leave voice mail message due to voice mail not set up.    Objective: Per Epic case review: Patient has a history of hypertension,hyperlipidemia, renal failure, status post kidney transplant 2006 and urinary retention. Patient refused [place] Management services on 06/05/15.   Assessment: Received [facility] HMO Tier 4 list referral on 08/14/15. Diagnosis: Diabetes. 4 ED visits and 1 admit. Telephone screening pending patient contact.   Plan: RNCM will send patient successful outreach letter and proceed with case closure if no return call from patient within 10 business days.  Atlee Kluth H. Annia Friendly, BSN, [place] Management [facility] Telephonic CM Phone: [phone] Fax: [phone]

## 2015-09-03 ENCOUNTER — Ambulatory Visit (INDEPENDENT_AMBULATORY_CARE_PROVIDER_SITE_OTHER): Payer: Commercial Managed Care - HMO | Admitting: Urology

## 2015-09-03 ENCOUNTER — Encounter: Payer: Self-pay | Admitting: Urology

## 2015-09-03 VITALS — BP 142/55 | HR 53 | Ht 68.0 in | Wt 149.0 lb

## 2015-09-03 DIAGNOSIS — N528 Other male erectile dysfunction: Secondary | ICD-10-CM | POA: Diagnosis not present

## 2015-09-03 DIAGNOSIS — N529 Male erectile dysfunction, unspecified: Secondary | ICD-10-CM

## 2015-09-03 DIAGNOSIS — N401 Enlarged prostate with lower urinary tract symptoms: Secondary | ICD-10-CM | POA: Diagnosis not present

## 2015-09-03 DIAGNOSIS — N411 Chronic prostatitis: Secondary | ICD-10-CM | POA: Diagnosis not present

## 2015-09-03 DIAGNOSIS — N138 Other obstructive and reflux uropathy: Secondary | ICD-10-CM

## 2015-09-03 MED ORDER — CIPROFLOXACIN HCL 250 MG PO TABS: 250.0000 mg | ORAL_TABLET | Freq: Two times a day (BID) | ORAL | Status: DC

## 2015-09-03 NOTE — Progress Notes (Signed)
4:19 PM   MARISA LATHE [birth year] [id number]  Referring provider: Arnetha Courser, MD [address] [place], [place] [address]  Chief Complaint  Patient presents with  . Benign Prostatic Hypertrophy    2WK    HPI: Patient is 75 year old African-American male who was initiated on Bactrim one week ago for prostatitis. He presents today for follow-up.  Previous history Patient with a history of renal failure status post kidney transplant 2006, BPH and recurrent prostatitis.  He is a previous patient of Dr. Vonita Moss. He presented today with complaint of right groin pain 3 weeks. He reports the pain is intermittent and waxes and wanes in severity. He states that this is typical symptom when he has bouts of prostatitis. He states that Dr. Madelin Headings is to massage his prostate in his symptoms were resolved. He has been taking Flomax and finasteride for many years for management of his BPH and denies any urinary symptoms today. No fevers, flank pain or gross hematuria.  No Carver of prostate cancer.   He states he's noticed some mild improvement with the Bactrim, but the pain still persists.  Not having difficulty with urination. He is not having dysuria, gross hematuria or suprapubic pain.    He is currently on tamsulosin and finasteride for his BPH.  His I PSS score today is 6/1.        IPSS      08/16/15 1500 09/03/15 1600     International Prostate Symptom Score   How often have you had the sensation of not emptying your bladder? Not at All Not at All    How often have you had to urinate less than every two hours? Almost always Less than 1 in 5 times    How often have you found you stopped and started again several times when you urinated? Not at All Not at All    How often have you found it difficult to postpone urination? Not at All About half the time    How often have you had a weak urinary stream? Not at All Not at All    How often have you had to strain to start urination? Not at All Not at All     How many times did you typically get up at night to urinate? 2 Times 2 Times    Total IPSS Score 7 6    Quality of Life due to urinary symptoms   If you were to spend the rest of your life with your urinary condition just the way it is now how would you feel about that? Delighted Pleased       Score:  1-7 Mild 8-19 Moderate 20-35 Severe  PMH: Past Medical History  Diagnosis Date  . Persistent atrial fibrillation ([place])     a. CHA2DS2VASc = 3-->coumadin d/c'd 02/2015 2/2 recurrent GIB.  [name] Hyperlipidemia   . Benign prostatic hypertrophy   . GERD (gastroesophageal reflux disease)   . Osteoarthrosis, unspecified whether generalized or localized, lower leg   . Essential hypertension   . End stage renal disease ([place])   . Renal transplant recipient   . GIB (gastrointestinal bleeding)     a. [id number] s/p R colic artery embolization;  b. 02/2015 EGD: duod ulcerative mass->Bx notable for coagulative necrosis - ? ischemia vs thrombosis-->coumadin d/c'd.  . Pulmonary hypertension ([place])     a. 10/2014 Echo: EF 60-65%, mild to mod MR, mildly dil LA, nl RV, PASP 47mmHg.  . Prostatitis   . ED (  erectile dysfunction)     Surgical History: Past Surgical History  Procedure Laterality Date  . Esophagogastroduodenoscopy  03/13/15    severe esophagitis, ulcerated mass    Home Medications:    Medication List       This list is accurate as of: 09/03/15  4:19 PM.  Always use your most recent med list.               allopurinol 100 MG tablet  Commonly known as:  ZYLOPRIM  Take 100 mg by mouth daily.     ciprofloxacin 250 MG tablet  Commonly known as:  CIPRO  Take 1 tablet (250 mg total) by mouth 2 (two) times daily.     finasteride 5 MG tablet  Commonly known as:  PROSCAR  Take 5 mg by mouth daily.     FLOMAX 0.4 MG Caps capsule  Generic drug:  tamsulosin  Take 0.4 mg by mouth daily.     furosemide 80 MG tablet  Commonly known as:  LASIX  TAKE 1 TABLET (80 MG TOTAL) BY MOUTH TWO  (2) TIMES A DAY.     hydroxypropyl methylcellulose 2.5 % ophthalmic solution  Commonly known as:  ISOPTO TEARS  Place 1 drop into both eyes as needed.     magnesium oxide 400 (241.3 Mg) MG tablet  Commonly known as:  MAG-OX  TAKE 1 TABLET (400 MG TOTAL) BY MOUTH TWO (2) TIMES A DAY.     metoprolol tartrate 25 MG tablet  Commonly known as:  LOPRESSOR  TAKE 1 TABLET (25 MG TOTAL) BY MOUTH TWO (2) TIMES A DAY.     multivitamin tablet  Take 1 tablet by mouth daily.     MYFORTIC 180 MG EC tablet  Generic drug:  mycophenolate  Take 2 tablets twice a day, V42.0 kidney transplant, tx date 11/05/04, DAW1, brand medically necessary     omeprazole 20 MG capsule  Commonly known as:  PRILOSEC  Take 20 mg by mouth 2 (two) times daily before a meal.     predniSONE 5 MG tablet  Commonly known as:  DELTASONE  Take 5 mg by mouth daily with breakfast.     PROAIR HFA 108 (90 Base) MCG/ACT inhaler  Generic drug:  albuterol  Inhale into the lungs.     sucralfate 1 g tablet  Commonly known as:  CARAFATE  Take 1 g by mouth 4 (four) times daily.     tacrolimus 1 MG capsule  Commonly known as:  PROGRAF  Take 1 mg by mouth 3 (three) times daily. Reported on 08/16/2015        Allergies: No Known Allergies  Family History: Family History  Problem Relation Age of Onset  . Cancer Mother     throat  . Diabetes Brother   . Heart disease Brother   . Stroke Brother   . Hypertension Brother   . Diabetes Brother   . Diabetes Sister   . Heart disease Sister   . Hypertension Sister   . Diabetes Sister   . COPD Neg Hx     Social History:  reports that he quit smoking about 37 years ago. His smoking use included Cigarettes. He has a 25 pack-year smoking history. He has never used smokeless tobacco. He reports that he does not drink alcohol or use illicit drugs.  ROS: UROLOGY Frequent Urination?: No Hard to postpone urination?: Yes Burning/pain with urination?: No Get up at night to  urinate?: Yes Leakage of urine?: No Urine stream  starts and stops?: No Trouble starting stream?: No Do you have to strain to urinate?: No Blood in urine?: No Urinary tract infection?: No Sexually transmitted disease?: No Injury to kidneys or bladder?: No Painful intercourse?: No Weak stream?: No Erection problems?: No Penile pain?: No  Gastrointestinal Nausea?: No Vomiting?: No Indigestion/heartburn?: No Diarrhea?: No Constipation?: No  Constitutional Fever: No Night sweats?: No Weight loss?: No Fatigue?: No  Skin Skin rash/lesions?: No Itching?: No  Eyes Blurred vision?: No Double vision?: No  Ears/Nose/Throat Sore throat?: No Sinus problems?: No  Hematologic/Lymphatic Swollen glands?: No Easy bruising?: No  Cardiovascular Leg swelling?: No Chest pain?: No  Respiratory Cough?: No Shortness of breath?: No  Endocrine Excessive thirst?: No  Musculoskeletal Back pain?: No Joint pain?: No  Neurological Headaches?: No Dizziness?: No  Psychologic Depression?: No Anxiety?: No  Physical Exam: BP 142/55 mmHg  Pulse 53  Ht 5\' 8"  (1.727 m)  Wt 149 lb (67.586 kg)  BMI 22.66 kg/m2  Constitutional:  Alert and oriented, No acute distress. HEENT: [place] AT, moist mucus membranes.  Trachea midline, no masses. Cardiovascular: No clubbing, cyanosis, or edema. Respiratory: Normal respiratory effort, no increased work of breathing. GI: Abdomen is soft, nontender, nondistended, no abdominal masses GU: No CVA tenderness. No hernias appreciated. Skin: No rashes, bruises or suspicious lesions. Lymph: No cervical or inguinal adenopathy. Neurologic: Grossly intact, no focal deficits, moving all 4 extremities. Psychiatric: Normal mood and affect.  Laboratory Data: PSA History  0/36 ng/mL on 08/07/2015   Assessment & Plan:    1. BPH with LUTS:   IPSS score is 6/1.  Continue Flomax and Finasteride.  He will RTC in one year for IPSS score and exam.  2.  Prostatitis:   Patient is prescribed Cipro 250 mg bid for 30 days.  If his pain still persists, he will follow up.  Otherwise, we will see him in one year for follow up.    3. ED:  He is not interested in ED treatment at this time due to financial concerns.  We will readdress when he returns in one year with a SHIM and exam.    Return in about 1 year (around 09/02/2016) for IPSS score and exam.  These notes generated with voice recognition software. I apologize for typographical errors.  Zara Council, [facility] [address] [place], [place] [address] [phone]

## 2015-09-12 ENCOUNTER — Other Ambulatory Visit: Payer: Self-pay | Admitting: *Deleted

## 2015-09-12 ENCOUNTER — Encounter: Payer: Self-pay | Admitting: *Deleted

## 2015-09-12 NOTE — Patient Outreach (Signed)
[facility]) Care Management  09/12/2015  Matthew Brown [birth year] [id number]  No response from patient outreach attempts, will proceed with case closure.  Objective: Per Epic case review: Patient has a history of hypertension,hyperlipidemia, renal failure, status post kidney transplant 2006 and urinary retention. Patient refused [place] Management services on 06/05/15.   Assessment: Received [facility] HMO Tier 4 list referral on 08/14/15. Diagnosis: Diabetes. 4 ED visits and 1 admit. No response from patient outreach attempts, will proceed with case closure.    Plan: RNCM will send case closure letter due to unable to reach to patient's primary MD. RNCM will send case closure due to unable to reach request to [place] at [place] Management.   Meika Earll H. Annia Friendly, BSN, [place] Management [facility] Telephonic CM Phone: [phone] Fax: [phone]

## 2015-10-01 ENCOUNTER — Telehealth: Payer: Self-pay | Admitting: Urology

## 2015-10-01 NOTE — Telephone Encounter (Signed)
Pt is having problems with his prostate

## 2015-10-09 ENCOUNTER — Encounter: Payer: Self-pay | Admitting: Urology

## 2015-10-09 ENCOUNTER — Ambulatory Visit (INDEPENDENT_AMBULATORY_CARE_PROVIDER_SITE_OTHER): Payer: Commercial Managed Care - HMO | Admitting: Urology

## 2015-10-09 VITALS — BP 121/46 | HR 55 | Ht 68.0 in | Wt 160.6 lb

## 2015-10-09 DIAGNOSIS — N138 Other obstructive and reflux uropathy: Secondary | ICD-10-CM

## 2015-10-09 DIAGNOSIS — S76011A Strain of muscle, fascia and tendon of right hip, initial encounter: Secondary | ICD-10-CM | POA: Diagnosis not present

## 2015-10-09 DIAGNOSIS — S76019A Strain of muscle, fascia and tendon of unspecified hip, initial encounter: Secondary | ICD-10-CM | POA: Insufficient documentation

## 2015-10-09 DIAGNOSIS — N401 Enlarged prostate with lower urinary tract symptoms: Secondary | ICD-10-CM | POA: Diagnosis not present

## 2015-10-09 LAB — URINALYSIS, COMPLETE
BILIRUBIN UA: NEGATIVE
GLUCOSE, UA: NEGATIVE
KETONES UA: NEGATIVE
LEUKOCYTES UA: NEGATIVE
Nitrite, UA: NEGATIVE
RBC UA: NEGATIVE
SPEC GRAV UA: 1.01 (ref 1.005–1.030)
Urobilinogen, Ur: 0.2 mg/dL (ref 0.2–1.0)
pH, UA: 5.5 (ref 5.0–7.5)

## 2015-10-09 LAB — MICROSCOPIC EXAMINATION: Bacteria, UA: NONE SEEN

## 2015-10-09 NOTE — Progress Notes (Addendum)
9:21 AM   Matthew Brown March 11, 1941 [id number]  Referring provider: Arnetha Courser, MD [address] [place], [place] [address]  Chief Complaint  Patient presents with  . Benign Prostatic Hypertrophy    Patient states having problems with prostate    HPI: Patient is 75 year old African-American male who presents today complaining of right groin pain for the last week.  He is not having trouble with urination at this time.  He denies any changes in bowel movements, but he did have an episode of diarrhea after eating ice cream last night.  He states the pain is worse in the am and relieved with walking.  He describes the pain as something sticking him.  Sitting and laying down makes the pain worse.  He has not seen a bulge in the area.    He is not having dysuria, gross hematuria or suprapubic pain.    He is currently on tamsulosin and finasteride for his BPH.  His I PSS score on 09/03/2015 was 6/1.  His UA is negative.       IPSS      08/16/15 1500 09/03/15 1600     International Prostate Symptom Score   How often have you had the sensation of not emptying your bladder? Not at All Not at All    How often have you had to urinate less than every two hours? Almost always Less than 1 in 5 times    How often have you found you stopped and started again several times when you urinated? Not at All Not at All    How often have you found it difficult to postpone urination? Not at All About half the time    How often have you had a weak urinary stream? Not at All Not at All    How often have you had to strain to start urination? Not at All Not at All    How many times did you typically get up at night to urinate? 2 Times 2 Times    Total IPSS Score 7 6    Quality of Life due to urinary symptoms   If you were to spend the rest of your life with your urinary condition just the way it is now how would you feel about that? Delighted Pleased       Score:  1-7 Mild 8-19  Moderate 20-35 Severe  PMH: Past Medical History  Diagnosis Date  . Persistent atrial fibrillation ([place])     a. CHA2DS2VASc = 3-->coumadin d/c'd 02/2015 2/2 recurrent GIB.  [name] Hyperlipidemia   . Benign prostatic hypertrophy   . GERD (gastroesophageal reflux disease)   . Osteoarthrosis, unspecified whether generalized or localized, lower leg   . Essential hypertension   . End stage renal disease ([place])   . Renal transplant recipient   . GIB (gastrointestinal bleeding)     a. [id number] s/p R colic artery embolization;  b. 02/2015 EGD: duod ulcerative mass->Bx notable for coagulative necrosis - ? ischemia vs thrombosis-->coumadin d/c'd.  . Pulmonary hypertension ([place])     a. 10/2014 Echo: EF 60-65%, mild to mod MR, mildly dil LA, nl RV, PASP 61mmHg.  . Prostatitis   . ED (erectile dysfunction)     Surgical History: Past Surgical History  Procedure Laterality Date  . Esophagogastroduodenoscopy  03/13/15    severe esophagitis, ulcerated mass  . Back surgery    . Total knee arthroplasty    . Throat surgery    . Stomach surgery  blood vessel burst    Home Medications:    Medication List       This list is accurate as of: 10/09/15  9:21 AM.  Always use your most recent med list.               allopurinol 100 MG tablet  Commonly known as:  ZYLOPRIM  Take 100 mg by mouth daily.     ciprofloxacin 250 MG tablet  Commonly known as:  CIPRO  Take 1 tablet (250 mg total) by mouth 2 (two) times daily.     finasteride 5 MG tablet  Commonly known as:  PROSCAR  Take 5 mg by mouth daily.     FLOMAX 0.4 MG Caps capsule  Generic drug:  tamsulosin  Take 0.4 mg by mouth daily.     furosemide 80 MG tablet  Commonly known as:  LASIX  TAKE 1 TABLET (80 MG TOTAL) BY MOUTH TWO (2) TIMES A DAY.     hydroxypropyl methylcellulose 2.5 % ophthalmic solution  Commonly known as:  ISOPTO TEARS  Place 1 drop into both eyes as needed.     magnesium oxide 400 (241.3 Mg) MG tablet  Commonly  known as:  MAG-OX  TAKE 1 TABLET (400 MG TOTAL) BY MOUTH TWO (2) TIMES A DAY.     metoprolol tartrate 25 MG tablet  Commonly known as:  LOPRESSOR  TAKE 1 TABLET (25 MG TOTAL) BY MOUTH TWO (2) TIMES A DAY.     multivitamin tablet  Take 1 tablet by mouth daily.     MYFORTIC 180 MG EC tablet  Generic drug:  mycophenolate  Take 2 tablets twice a day, V42.0 kidney transplant, tx date 11/05/04, DAW1, brand medically necessary     omeprazole 20 MG capsule  Commonly known as:  PRILOSEC  Take 20 mg by mouth 2 (two) times daily before a meal.     oxybutynin 5 MG 24 hr tablet  Commonly known as:  DITROPAN-XL  Reported on 10/09/2015     predniSONE 5 MG tablet  Commonly known as:  DELTASONE  Take 5 mg by mouth daily with breakfast.     PROAIR HFA 108 (90 Base) MCG/ACT inhaler  Generic drug:  albuterol  Inhale into the lungs.     sucralfate 1 g tablet  Commonly known as:  CARAFATE  Take 1 g by mouth 4 (four) times daily.     tacrolimus 1 MG capsule  Commonly known as:  PROGRAF  Take 1 mg by mouth 3 (three) times daily. Reported on 08/16/2015        Allergies: No Known Allergies  Family History: Family History  Problem Relation Age of Onset  . Cancer Mother     throat  . Diabetes Brother   . Heart disease Brother   . Stroke Brother   . Hypertension Brother   . Diabetes Brother   . Diabetes Sister   . Heart disease Sister   . Hypertension Sister   . Diabetes Sister   . COPD Neg Hx   . Kidney disease Neg Hx   . Prostate cancer Neg Hx     Social History:  reports that he quit smoking about 37 years ago. His smoking use included Cigarettes. He has a 25 pack-year smoking history. He has never used smokeless tobacco. He reports that he does not drink alcohol or use illicit drugs.  ROS: UROLOGY Frequent Urination?: No Hard to postpone urination?: No Burning/pain with urination?: No Get up at night  to urinate?: No Leakage of urine?: No Urine stream starts and stops?:  No Trouble starting stream?: No Do you have to strain to urinate?: No Blood in urine?: No Urinary tract infection?: No Sexually transmitted disease?: No Injury to kidneys or bladder?: No Painful intercourse?: No Weak stream?: No Erection problems?: No Penile pain?: No  Gastrointestinal Nausea?: No Vomiting?: No Indigestion/heartburn?: No Diarrhea?: No Constipation?: No  Constitutional Fever: No Night sweats?: No Weight loss?: No Fatigue?: No  Skin Skin rash/lesions?: No Itching?: No  Eyes Blurred vision?: No Double vision?: No  Ears/Nose/Throat Sore throat?: No Sinus problems?: No  Hematologic/Lymphatic Swollen glands?: No Easy bruising?: No  Cardiovascular Leg swelling?: No Chest pain?: No  Respiratory Cough?: No Shortness of breath?: No  Endocrine Excessive thirst?: No  Musculoskeletal Back pain?: No Joint pain?: No  Neurological Headaches?: No Dizziness?: No  Psychologic Depression?: No Anxiety?: No  Physical Exam: BP 121/46 mmHg  Pulse 55  Ht 5\' 8"  (1.727 m)  Wt 160 lb 9.6 oz (72.848 kg)  BMI 24.42 kg/m2  Constitutional:  Alert and oriented, No acute distress. HEENT: [place] AT, moist mucus membranes.  Trachea midline, no masses. Cardiovascular: No clubbing, cyanosis, or edema. Respiratory: Normal respiratory effort, no increased work of breathing. GI: Abdomen is soft, nontender, nondistended, no abdominal masses GU: No CVA tenderness. No hernias appreciated.  Pain when palpating the pubic ramus and pubofemoral and ileofemoral ligaments.   Skin: No rashes, bruises or suspicious lesions. Lymph: No cervical or inguinal adenopathy. Neurologic: Grossly intact, no focal deficits, moving all 4 extremities. Psychiatric: Normal mood and affect.  Laboratory Data: PSA History  0.36 ng/mL on 08/07/2015   Assessment & Plan:    1. BPH with LUTS:   IPSS score is 6/1.  Continue Flomax and Finasteride.  He will RTC in one year for IPSS score and  exam.  2. Hip strain:   Patient will contact his PCP, Dr. Sanda Klein for further management.  I did not prescribe an NSAID at this time due to his history of ulcers.    3. ED:  He is not interested in ED treatment at this time due to financial concerns.  We will readdress when he returns in one year with a SHIM and exam.    Return for patient to contact Dr. Sanda Klein for further evaluation of his hip strain.  These notes generated with voice recognition software. I apologize for typographical errors.  Zara Council, [facility] [address] [place], [place] [address] [phone]

## 2015-10-10 LAB — GC/CHLAMYDIA PROBE AMP
Chlamydia trachomatis, NAA: NEGATIVE
Neisseria gonorrhoeae by PCR: NEGATIVE

## 2015-10-14 LAB — CULTURE, URINE COMPREHENSIVE

## 2015-10-19 ENCOUNTER — Encounter: Payer: Self-pay | Admitting: Family Medicine

## 2015-10-19 ENCOUNTER — Ambulatory Visit (INDEPENDENT_AMBULATORY_CARE_PROVIDER_SITE_OTHER): Payer: Commercial Managed Care - HMO | Admitting: Family Medicine

## 2015-10-19 VITALS — BP 122/58 | HR 57 | Temp 97.6°F | Resp 16 | Ht 68.0 in | Wt 168.0 lb

## 2015-10-19 DIAGNOSIS — G8929 Other chronic pain: Secondary | ICD-10-CM

## 2015-10-19 DIAGNOSIS — N62 Hypertrophy of breast: Secondary | ICD-10-CM | POA: Diagnosis not present

## 2015-10-19 DIAGNOSIS — M542 Cervicalgia: Secondary | ICD-10-CM | POA: Diagnosis not present

## 2015-10-19 DIAGNOSIS — R39198 Other difficulties with micturition: Secondary | ICD-10-CM | POA: Diagnosis not present

## 2015-10-19 DIAGNOSIS — N644 Mastodynia: Secondary | ICD-10-CM

## 2015-10-19 DIAGNOSIS — M25561 Pain in right knee: Secondary | ICD-10-CM

## 2015-10-19 DIAGNOSIS — R1031 Right lower quadrant pain: Secondary | ICD-10-CM

## 2015-10-19 DIAGNOSIS — R51 Headache: Secondary | ICD-10-CM

## 2015-10-19 DIAGNOSIS — R1030 Lower abdominal pain, unspecified: Secondary | ICD-10-CM

## 2015-10-19 DIAGNOSIS — N6489 Other specified disorders of breast: Secondary | ICD-10-CM | POA: Diagnosis not present

## 2015-10-19 DIAGNOSIS — IMO0002 Reserved for concepts with insufficient information to code with codable children: Secondary | ICD-10-CM

## 2015-10-19 DIAGNOSIS — R519 Headache, unspecified: Secondary | ICD-10-CM | POA: Insufficient documentation

## 2015-10-19 HISTORY — DX: Other chronic pain: G89.29

## 2015-10-19 HISTORY — DX: Headache, unspecified: R51.9

## 2015-10-19 LAB — POCT URINALYSIS DIPSTICK
BILIRUBIN UA: NEGATIVE
Blood, UA: NEGATIVE
GLUCOSE UA: NEGATIVE
Ketones, UA: NEGATIVE
LEUKOCYTES UA: NEGATIVE
NITRITE UA: NEGATIVE
Protein, UA: NEGATIVE
Spec Grav, UA: 1.02
Urobilinogen, UA: 0.2
pH, UA: 6.5

## 2015-10-19 NOTE — Assessment & Plan Note (Addendum)
Urine checked today; suspect musculoskeletal etiology; no lymphadenopathy found; refer to ortho

## 2015-10-19 NOTE — Progress Notes (Signed)
BP 122/58 mmHg  Pulse 57  Temp(Src) 97.6 F (36.4 C) (Oral)  Resp 16  Ht 5\' 8"  (1.727 m)  Wt 168 lb (76.204 kg)  BMI 25.55 kg/m2  SpO2 96%   Subjective:    Patient ID: Matthew Brown, male    DOB: December 21, 1940, 75 y.o.   MRN: [id number]  HPI: Matthew Brown is a 75 y.o. male  Chief Complaint  Patient presents with  . Groin Pain    right side  . Breast Pain    onset 3 weeks  . Headache    head pain states about 2 years ago a chain broke of his tractor and hit him in the back of the head    Patient is here with several issues  He has groin pain on the right side; he got his prostate checked out; has had trouble since age 75 with his prostate; had microwave surgery years ago for enlarged prostate; they would massage his prostate and that used to get the swelling out; the lady doctor checked him here the last time to see if he had a hernia; it was really painful, but they couldn't find a hernia; she says it is a leader in his groin; trouble in his knee; Dr. Eddie Brown did his surgery in 2007; the urologist told him he had a leader and the pain was coming from somewhere else; he saw orthopaedist in 2016; the plastic is breaking up and wearing down; in December, that ortho retired; he says the knee bothershim bad and runs up toward the groin area; he told the urologist, wonders why it makes his water flow; peeing good he says; he now has some urgency, but has to go again; he is taking 80 mg fluid pill BID; not peeing as much with the groin; rviewed Matthew Brown's note; pain started Apri l11th; per her note: He denies any changes in bowel movements, but he did have an episode of diarrhea after eating ice cream last night. He states the pain is worse in the am and relieved with walking. He describes the pain as something sticking him. Sitting and laying down makes the pain worse. He has not seen a bulge in the area. Those are all still true; no blood in the urine; urine today did not show blood  He  also has breast pain, 3 weeks duration; he has started to have itching in his right breast; after the itching stopped, it got sore; now sore in both breasts; nothing similar in the past; right breast has swollen; no nipple discharge; tender to the touch; has not tried anything on it, except lets the hot water run on his chest, but no help  He also c/o headache; started 2 years ago; a chain broke off of his tractor and hit him in the back of the head; the chain hit hit on the neck and back of the head; trouble with the neck, hurts to bend the neck and turn it; pain goes up the back of the head; after he got hit, it took him an hour to get his mind straight; no LOC; all kinds of knots in the back of his neck; uses hot towels and it eases after an hour; no shooting in the arms; no nausea or vomiting; he tries to stay away from pain pills  He goes back to [facility] to see for repeat EGD  Depression screen Matthew Brown 2/9 10/19/2015 06/05/2015  Decreased Interest 0 0  Down, Depressed, Hopeless 0 0  PHQ - 2 Score 0 0   Relevant past medical, surgical, family and social history reviewed and updated as indicated. Interim medical history since our last visit reviewed. Allergies and medications reviewed and updated.  Review of Systems Per HPI unless specifically indicated above     Objective:    BP 122/58 mmHg  Pulse 57  Temp(Src) 97.6 F (36.4 C) (Oral)  Resp 16  Ht 5\' 8"  (1.727 m)  Wt 168 lb (76.204 kg)  BMI 25.55 kg/m2  SpO2 96%  Wt Readings from Last 3 Encounters:  10/19/15 168 lb (76.204 kg)  10/09/15 160 lb 9.6 oz (72.848 kg)  09/03/15 149 lb (67.586 kg)    Physical Exam  Constitutional: He appears well-developed and well-nourished. No distress.  HENT:  Head: Normocephalic and atraumatic.  Eyes: EOM are normal. No scleral icterus.  Neck: No thyromegaly present.  Cardiovascular: Normal rate and regular rhythm.   Pulmonary/Chest: Effort normal and breath sounds normal. Right breast exhibits  mass. Left breast exhibits mass.  Bilateral breast hypertrophy  Abdominal: Soft. Bowel sounds are normal. He exhibits no distension.  Musculoskeletal: He exhibits no edema.       Right hip: He exhibits tenderness (tender over the right hip flexors).  Neurological: He is alert. He displays no tremor. Coordination normal.  Skin: Skin is warm and dry. No pallor.  Psychiatric: He has a normal mood and affect. His behavior is normal. Judgment and thought content normal.   Results for orders placed or performed in visit on 10/19/15  POCT urinalysis dipstick  Result Value Ref Range   Color, UA yell    Clarity, UA clr    Glucose, UA neg    Bilirubin, UA neg    Ketones, UA neg    Spec Grav, UA 1.020    Blood, UA neg    pH, UA 6.5    Protein, UA neg    Urobilinogen, UA 0.2    Nitrite, UA neg    Leukocytes, UA Negative Negative      Assessment & Plan:   Problem List Items Addressed This Visit      Other   Chronic headache   Relevant Orders   DG Cervical Spine Complete (Completed)   CT Head Wo Contrast (Completed)   Difficulty urinating    Encouraged pt to let his kidney doctor know about this; urine today was unremarkable      Right groin pain - Primary    Urine checked today; suspect musculoskeletal etiology; no lymphadenopathy found; refer to ortho      Relevant Orders   POCT urinalysis dipstick (Completed)   Ambulatory referral to Orthopedic Surgery    Other Visit Diagnoses    Breast pain in male        Chronic neck pain        suspect arthritis, perhaps C3-4 with the posterior headache; imaging ordered    Relevant Orders    DG Cervical Spine Complete (Completed)    CT Head Wo Contrast (Completed)    Breast asymmetry        check [place] and mammo; suspect medication-related    Relevant Orders    MM DIAG BREAST TOMO BILATERAL (Completed)    Breast enlargement        Relevant Orders    MM DIAG BREAST TOMO BILATERAL (Completed)    Right knee pain        Relevant Orders     Ambulatory referral to Orthopedic Surgery  Follow up plan: No Follow-up on file.  An after-visit summary was printed and given to the patient at [facility].  Please see the patient instructions which may contain other information and recommendations beyond what is mentioned above in the assessment and plan.  Orders Placed This Encounter  Procedures  . DG Cervical Spine Complete  . CT Head Wo Contrast  . MM DIAG BREAST TOMO BILATERAL  . Ambulatory referral to Orthopedic Surgery  . POCT urinalysis dipstick

## 2015-10-19 NOTE — Patient Instructions (Signed)
Please have the xray of your neck done next week We'll schedule the head CT and appointment to see the orthopaedist We'll schedule the breast imaging If you have not heard anything from my staff in a week about any orders/referrals/studies from today, please contact us here to follow-up ([id number] Do let your kidney doctor know about your changes to your urine stream

## 2015-10-29 ENCOUNTER — Ambulatory Visit
Admission: RE | Admit: 2015-10-29 | Discharge: 2015-10-29 | Disposition: A | Discharge status: Home / Self Care | Payer: Commercial Managed Care - HMO | Source: Ambulatory Visit | Attending: Family Medicine | Admitting: Family Medicine

## 2015-10-29 DIAGNOSIS — I6522 Occlusion and stenosis of left carotid artery: Secondary | ICD-10-CM | POA: Diagnosis not present

## 2015-10-29 DIAGNOSIS — R51 Headache: Secondary | ICD-10-CM | POA: Diagnosis present

## 2015-10-29 DIAGNOSIS — I739 Peripheral vascular disease, unspecified: Secondary | ICD-10-CM | POA: Diagnosis not present

## 2015-10-29 DIAGNOSIS — M542 Cervicalgia: Secondary | ICD-10-CM | POA: Diagnosis present

## 2015-10-29 DIAGNOSIS — R918 Other nonspecific abnormal finding of lung field: Secondary | ICD-10-CM | POA: Insufficient documentation

## 2015-10-29 DIAGNOSIS — M5030 Other cervical disc degeneration, unspecified cervical region: Secondary | ICD-10-CM | POA: Diagnosis not present

## 2015-10-29 DIAGNOSIS — G8929 Other chronic pain: Secondary | ICD-10-CM | POA: Diagnosis present

## 2015-10-31 ENCOUNTER — Other Ambulatory Visit: Payer: Self-pay | Admitting: Family Medicine

## 2015-10-31 DIAGNOSIS — M5090 Cervical disc disorder, unspecified, unspecified cervical region: Secondary | ICD-10-CM

## 2015-10-31 DIAGNOSIS — I6523 Occlusion and stenosis of bilateral carotid arteries: Secondary | ICD-10-CM

## 2015-10-31 DIAGNOSIS — R9389 Abnormal findings on diagnostic imaging of other specified body structures: Secondary | ICD-10-CM

## 2015-11-13 ENCOUNTER — Telehealth: Payer: Self-pay | Admitting: Family Medicine

## 2015-11-13 NOTE — Telephone Encounter (Signed)
Pt wants to know what he can take for constipation, He states miralax does not help

## 2015-11-13 NOTE — Telephone Encounter (Signed)
Pt would like a call back please. °

## 2015-11-14 MED ORDER — LACTULOSE 10 GM/15ML PO SOLN: 5.0000 g | Freq: Every day | ORAL | Status: DC | PRN

## 2015-11-14 NOTE — Telephone Encounter (Signed)
We'll encourage him first of all to get 38 grams of fiber a day (black beans, lima beans, pinto beans, fruits and vegetables, whole grains, etc.) and stay hydrated Some of his medicine may cause constipation I sent in a new prescription of something called lactulose Stop the miralax Call with any issues

## 2015-11-15 ENCOUNTER — Ambulatory Visit: Admission: RE | Admit: 2015-11-15 | Payer: Commercial Managed Care - HMO | Source: Ambulatory Visit

## 2015-11-15 ENCOUNTER — Ambulatory Visit
Admission: RE | Admit: 2015-11-15 | Discharge: 2015-11-15 | Disposition: A | Discharge status: Home / Self Care | Payer: Commercial Managed Care - HMO | Source: Ambulatory Visit | Attending: Family Medicine | Admitting: Family Medicine

## 2015-11-15 DIAGNOSIS — N6489 Other specified disorders of breast: Secondary | ICD-10-CM | POA: Diagnosis not present

## 2015-11-15 DIAGNOSIS — N62 Hypertrophy of breast: Secondary | ICD-10-CM | POA: Diagnosis present

## 2015-11-19 NOTE — Assessment & Plan Note (Signed)
Encouraged pt to let his kidney doctor know about this; urine today was unremarkable

## 2015-11-21 ENCOUNTER — Telehealth: Payer: Self-pay | Admitting: Family Medicine

## 2015-11-21 ENCOUNTER — Ambulatory Visit
Admission: RE | Admit: 2015-11-21 | Discharge: 2015-11-21 | Disposition: A | Discharge status: Home / Self Care | Payer: Commercial Managed Care - HMO | Source: Ambulatory Visit | Attending: Family Medicine | Admitting: Family Medicine

## 2015-11-21 DIAGNOSIS — J90 Pleural effusion, not elsewhere classified: Secondary | ICD-10-CM | POA: Insufficient documentation

## 2015-11-21 DIAGNOSIS — M4802 Spinal stenosis, cervical region: Secondary | ICD-10-CM | POA: Insufficient documentation

## 2015-11-21 DIAGNOSIS — M5090 Cervical disc disorder, unspecified, unspecified cervical region: Secondary | ICD-10-CM

## 2015-11-21 DIAGNOSIS — I6521 Occlusion and stenosis of right carotid artery: Secondary | ICD-10-CM | POA: Diagnosis not present

## 2015-11-21 DIAGNOSIS — I6523 Occlusion and stenosis of bilateral carotid arteries: Secondary | ICD-10-CM

## 2015-11-21 NOTE — Telephone Encounter (Signed)
I spoke with patient He has multiple issues going on with his neck, and I briefly described arthritis, disc problems, narrowing, pinched nerves, etc.; we'll have him see spine surgeon at [facility] He has a large pleural effusion on the right; surprisingly, he is not short or breath at all; I told him that we'll communicate with his specialist at [facility] and try to get him set up there to see about that; if he gets short of breath at all between now and then, go to [facility] ER He agrees; he was almost out of cell phone minutes, so we kept our call brief ----------------------  Roselyn Reef -- please contact his transplant specialist / coordinator at [facility] first thing Thursday morning because I need to talk them ASAP about his pleural effusion and neck issues to get him seen there at [facility]

## 2015-11-22 NOTE — Telephone Encounter (Signed)
Dr lada spoke with [facility] transplant coordinator

## 2015-11-28 ENCOUNTER — Telehealth: Payer: Self-pay | Admitting: Family Medicine

## 2015-11-28 NOTE — Telephone Encounter (Signed)
I would be willing to write him for some pain medicine, such as low dose percocet That should not cause any problems with his kidneys Ask if he's ever taken percocet and would he be okay taking that (any nausea, for example, or itching?) He certainly has reason to hurt, so let me know if he'd like to pick up Rx for pain medicine and I can get him something Thursday Thank you

## 2015-11-28 NOTE — Telephone Encounter (Signed)
Had MRI and received results but would like to know what can he put on his neck to help with the excessive neck pain

## 2015-11-29 MED ORDER — OXYCODONE-ACETAMINOPHEN 5-325 MG PO TABS: 0.5000 | ORAL_TABLET | Freq: Four times a day (QID) | ORAL | Status: DC | PRN

## 2015-11-29 NOTE — Telephone Encounter (Signed)
It's been a long time since he has took, does not recall any problems.  Will be here to pick up soon

## 2015-11-29 NOTE — Telephone Encounter (Signed)
rx ready 

## 2016-01-03 ENCOUNTER — Telehealth: Payer: Self-pay | Admitting: Family Medicine

## 2016-01-03 MED ORDER — OXYCODONE-ACETAMINOPHEN 5-325 MG PO TABS: 0.5000 | ORAL_TABLET | Freq: Four times a day (QID) | ORAL | Status: DC | PRN

## 2016-01-03 NOTE — Telephone Encounter (Signed)
I have just printed off a refill of the oxycodone pain medicine I gave him in June It has to be picked up in person, so he can get that today or tomorrow; we're sorry that it can't be faxed or called in I hope they are able to help him with his pain soon, but this should be enough I hope until he can get in with the pain clinic there

## 2016-01-03 NOTE — Telephone Encounter (Signed)
Pt states Dr lada is aware of his neck issues and he has currently seen a Dr in [place] and he is now waiting to hear back from that office so they can set him up with pain specialist. Pt wants to know what he can take or if he can get something for pain until he hears from that office. Please advise.

## 2016-01-03 NOTE — Telephone Encounter (Signed)
Pt informed and will be by to pick up RX

## 2016-01-11 ENCOUNTER — Telehealth: Payer: Self-pay | Admitting: Family Medicine

## 2016-01-11 DIAGNOSIS — M4802 Spinal stenosis, cervical region: Secondary | ICD-10-CM

## 2016-01-11 NOTE — Assessment & Plan Note (Signed)
Refer to Dr. Sharlet Salina per patient request

## 2016-01-11 NOTE — Telephone Encounter (Signed)
Wants to see about getting referral to Dr. Phyllis Ginger?

## 2016-01-11 NOTE — Telephone Encounter (Signed)
Patient stated that he has a question regarding is neck pain.

## 2016-01-17 ENCOUNTER — Telehealth: Payer: Self-pay | Admitting: Family Medicine

## 2016-01-17 NOTE — Telephone Encounter (Signed)
Done [id number]

## 2016-02-01 ENCOUNTER — Other Ambulatory Visit: Payer: Self-pay | Admitting: Family Medicine

## 2016-02-01 DIAGNOSIS — M25561 Pain in right knee: Secondary | ICD-10-CM

## 2016-02-08 ENCOUNTER — Ambulatory Visit
Admission: RE | Admit: 2016-02-08 | Discharge: 2016-02-08 | Disposition: A | Discharge status: Home / Self Care | Payer: Commercial Managed Care - HMO | Source: Ambulatory Visit | Attending: Family Medicine | Admitting: Family Medicine

## 2016-02-08 DIAGNOSIS — M25561 Pain in right knee: Secondary | ICD-10-CM

## 2016-03-13 ENCOUNTER — Telehealth: Payer: Self-pay | Admitting: Family Medicine

## 2016-03-13 NOTE — Telephone Encounter (Signed)
Pt is having pain in his left hip Doctor Jerrye Noble put something in his back in 1986 Then saw Dr. Eddie Dibbles in the 1990's about his shoulder I offered referral; he already went to [facility], has some options, cortisone shot or surgery Hurting so bad They have not given him pain medicine, then he says they did but it made his heart flutter Pt was to try cortisone He can take half of his lady friend's oxycodone 5/325; I explained that is dangerous to take someone else's pain medicine I encouraged to call his transplant doctor; they said it's okay to have the cortisone shot I reviewed Dr. Felizardo Hoffmann note He will see him in two weeks, but his pains are bad and he can't wait Call Dr. Felizardo Hoffmann note ------------------------- I called and left msg for Dr. Felizardo Hoffmann staff to call pt about moving up appt

## 2016-03-13 NOTE — Telephone Encounter (Signed)
Please advise 

## 2016-03-24 ENCOUNTER — Telehealth: Payer: Self-pay | Admitting: Family Medicine

## 2016-03-24 NOTE — Telephone Encounter (Signed)
PER JAMIE SHE SAYS THAT THE PT NEEDS A APPT. GAVE HIM ONE FOR OCT 6 THIS Friday . PT SAYS THAT HE STILL NEEDS THE DR TO CALL HIM. SAYS THAT HE HAS INNER EAR SO BAD. JAMIE ALSO TOLD HIM IF HE NEEDS TO TO GO TO URGENT CARE OR THE HOSPITAL .

## 2016-03-25 MED ORDER — MECLIZINE HCL 25 MG PO TABS: 25.0000 mg | ORAL_TABLET | Freq: Three times a day (TID) | ORAL | 0 refills | Status: DC | PRN

## 2016-03-25 NOTE — Telephone Encounter (Signed)
I returned the call, spoke to pt He has an appt Friday He keeps having bad inner ear real bad; dizzy and spinning and off-balance; does not think he's had a stroke; no bad headache He'd like to try some medicine; Rx sent

## 2016-03-28 ENCOUNTER — Ambulatory Visit: Payer: Commercial Managed Care - HMO | Admitting: Family Medicine

## 2016-04-02 ENCOUNTER — Other Ambulatory Visit: Payer: Self-pay | Admitting: Family Medicine

## 2016-04-02 DIAGNOSIS — M545 Low back pain, unspecified: Secondary | ICD-10-CM

## 2016-04-02 DIAGNOSIS — G8929 Other chronic pain: Secondary | ICD-10-CM

## 2016-04-10 ENCOUNTER — Ambulatory Visit: Payer: Commercial Managed Care - HMO | Admitting: Cardiovascular Disease

## 2016-04-14 ENCOUNTER — Other Ambulatory Visit: Payer: Commercial Managed Care - HMO

## 2016-04-15 ENCOUNTER — Encounter (INDEPENDENT_AMBULATORY_CARE_PROVIDER_SITE_OTHER): Payer: Self-pay

## 2016-04-15 ENCOUNTER — Encounter: Payer: Self-pay | Admitting: Hematology and Oncology

## 2016-04-15 ENCOUNTER — Inpatient Hospital Stay: Payer: Commercial Managed Care - HMO

## 2016-04-15 ENCOUNTER — Inpatient Hospital Stay: Payer: Commercial Managed Care - HMO | Attending: Hematology and Oncology | Admitting: Hematology and Oncology

## 2016-04-15 ENCOUNTER — Ambulatory Visit: Payer: Commercial Managed Care - HMO | Admitting: Internal Medicine

## 2016-04-15 VITALS — BP 116/61 | HR 53 | Temp 97.3°F | Resp 18 | Wt 138.1 lb

## 2016-04-15 DIAGNOSIS — M899 Disorder of bone, unspecified: Secondary | ICD-10-CM | POA: Diagnosis present

## 2016-04-15 DIAGNOSIS — R591 Generalized enlarged lymph nodes: Secondary | ICD-10-CM | POA: Diagnosis not present

## 2016-04-15 DIAGNOSIS — Z8701 Personal history of pneumonia (recurrent): Secondary | ICD-10-CM | POA: Insufficient documentation

## 2016-04-15 DIAGNOSIS — I120 Hypertensive chronic kidney disease with stage 5 chronic kidney disease or end stage renal disease: Secondary | ICD-10-CM | POA: Insufficient documentation

## 2016-04-15 DIAGNOSIS — Z808 Family history of malignant neoplasm of other organs or systems: Secondary | ICD-10-CM | POA: Insufficient documentation

## 2016-04-15 DIAGNOSIS — J90 Pleural effusion, not elsewhere classified: Secondary | ICD-10-CM | POA: Diagnosis not present

## 2016-04-15 DIAGNOSIS — R509 Fever, unspecified: Secondary | ICD-10-CM

## 2016-04-15 DIAGNOSIS — Z940 Kidney transplant status: Secondary | ICD-10-CM | POA: Insufficient documentation

## 2016-04-15 DIAGNOSIS — N529 Male erectile dysfunction, unspecified: Secondary | ICD-10-CM | POA: Diagnosis not present

## 2016-04-15 DIAGNOSIS — G8929 Other chronic pain: Secondary | ICD-10-CM | POA: Diagnosis not present

## 2016-04-15 DIAGNOSIS — N400 Enlarged prostate without lower urinary tract symptoms: Secondary | ICD-10-CM | POA: Diagnosis not present

## 2016-04-15 DIAGNOSIS — D649 Anemia, unspecified: Secondary | ICD-10-CM

## 2016-04-15 DIAGNOSIS — E785 Hyperlipidemia, unspecified: Secondary | ICD-10-CM

## 2016-04-15 DIAGNOSIS — R599 Enlarged lymph nodes, unspecified: Secondary | ICD-10-CM

## 2016-04-15 DIAGNOSIS — M545 Low back pain: Secondary | ICD-10-CM | POA: Diagnosis not present

## 2016-04-15 DIAGNOSIS — R51 Headache: Secondary | ICD-10-CM

## 2016-04-15 DIAGNOSIS — Z87891 Personal history of nicotine dependence: Secondary | ICD-10-CM | POA: Diagnosis not present

## 2016-04-15 DIAGNOSIS — R634 Abnormal weight loss: Secondary | ICD-10-CM | POA: Insufficient documentation

## 2016-04-15 DIAGNOSIS — N2889 Other specified disorders of kidney and ureter: Secondary | ICD-10-CM | POA: Insufficient documentation

## 2016-04-15 DIAGNOSIS — M109 Gout, unspecified: Secondary | ICD-10-CM | POA: Diagnosis not present

## 2016-04-15 DIAGNOSIS — I4891 Unspecified atrial fibrillation: Secondary | ICD-10-CM | POA: Insufficient documentation

## 2016-04-15 DIAGNOSIS — N186 End stage renal disease: Secondary | ICD-10-CM | POA: Insufficient documentation

## 2016-04-15 DIAGNOSIS — K219 Gastro-esophageal reflux disease without esophagitis: Secondary | ICD-10-CM

## 2016-04-15 DIAGNOSIS — Z8719 Personal history of other diseases of the digestive system: Secondary | ICD-10-CM | POA: Diagnosis not present

## 2016-04-15 DIAGNOSIS — M4802 Spinal stenosis, cervical region: Secondary | ICD-10-CM | POA: Diagnosis not present

## 2016-04-15 DIAGNOSIS — Z79899 Other long term (current) drug therapy: Secondary | ICD-10-CM

## 2016-04-15 DIAGNOSIS — Z87440 Personal history of urinary (tract) infections: Secondary | ICD-10-CM | POA: Insufficient documentation

## 2016-04-15 LAB — CBC WITH DIFFERENTIAL/PLATELET
Basophils Absolute: 0 10*3/uL (ref 0–0.1)
Basophils Relative: 0 %
Eosinophils Absolute: 0.1 10*3/uL (ref 0–0.7)
Eosinophils Relative: 1 %
HCT: 28.9 % — ABNORMAL LOW (ref 40.0–52.0)
Hemoglobin: 9.5 g/dL — ABNORMAL LOW (ref 13.0–18.0)
Lymphocytes Relative: 5 %
Lymphs Abs: 0.4 10*3/uL — ABNORMAL LOW (ref 1.0–3.6)
MCH: 24.6 pg — ABNORMAL LOW (ref 26.0–34.0)
MCHC: 32.9 g/dL (ref 32.0–36.0)
MCV: 74.6 fL — ABNORMAL LOW (ref 80.0–100.0)
Monocytes Absolute: 0.5 10*3/uL (ref 0.2–1.0)
Monocytes Relative: 6 %
Neutro Abs: 6.5 10*3/uL (ref 1.4–6.5)
Neutrophils Relative %: 88 %
Platelets: 224 10*3/uL (ref 150–440)
RBC: 3.87 MIL/uL — ABNORMAL LOW (ref 4.40–5.90)
RDW: 19.4 % — ABNORMAL HIGH (ref 11.5–14.5)
WBC: 7.4 10*3/uL (ref 3.8–10.6)

## 2016-04-15 LAB — URIC ACID: Uric Acid, Serum: 7.7 mg/dL — ABNORMAL HIGH (ref 4.4–7.6)

## 2016-04-15 LAB — IRON AND TIBC
Iron: 15 ug/dL — ABNORMAL LOW (ref 45–182)
Saturation Ratios: 6 % — ABNORMAL LOW (ref 17.9–39.5)
TIBC: 241 ug/dL — ABNORMAL LOW (ref 250–450)
UIBC: 226 ug/dL

## 2016-04-15 LAB — VITAMIN B12: Vitamin B-12: 525 pg/mL (ref 180–914)

## 2016-04-15 LAB — RETICULOCYTES
RBC.: 3.87 MIL/uL — ABNORMAL LOW (ref 4.40–5.90)
Retic Count, Absolute: 77.4 10*3/uL (ref 19.0–183.0)
Retic Ct Pct: 2 % (ref 0.4–3.1)

## 2016-04-15 LAB — FOLATE: Folate: 27 ng/mL (ref >5.9)

## 2016-04-15 LAB — FERRITIN: Ferritin: 343 ng/mL — ABNORMAL HIGH (ref 24–336)

## 2016-04-15 LAB — LACTATE DEHYDROGENASE: LDH: 409 U/L — ABNORMAL HIGH (ref 98–192)
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
## 2016-04-15 NOTE — Progress Notes (Signed)
Patient here today as new evaluation regarding bone lesions.  Referred by Dr. Rip Harbour.  Patient states he has an appetite but has so much pain it is hard for him to eat.  Chronic pain in his lower back.  Made worse by sitting.  Gets little to no relief with anything.

## 2016-04-16 LAB — KAPPA/LAMBDA LIGHT CHAINS
Kappa free light chain: 31.4 mg/L — ABNORMAL HIGH (ref 3.3–19.4)
Kappa, lambda light chain ratio: 1.06 (ref 0.26–1.65)
Lambda free light chains: 29.5 mg/L — ABNORMAL HIGH (ref 5.7–26.3)

## 2016-04-17 ENCOUNTER — Telehealth: Payer: Self-pay

## 2016-04-17 ENCOUNTER — Telehealth: Payer: Self-pay | Admitting: *Deleted

## 2016-04-17 LAB — MULTIPLE MYELOMA PANEL, SERUM
Albumin SerPl Elph-Mcnc: 3 g/dL (ref 2.9–4.4)
Albumin/Glob SerPl: 1.1 (ref 0.7–1.7)
Alpha 1: 0.5 g/dL — ABNORMAL HIGH (ref 0.0–0.4)
Alpha2 Glob SerPl Elph-Mcnc: 0.8 g/dL (ref 0.4–1.0)
B-Globulin SerPl Elph-Mcnc: 0.9 g/dL (ref 0.7–1.3)
Gamma Glob SerPl Elph-Mcnc: 0.8 g/dL (ref 0.4–1.8)
Globulin, Total: 3 g/dL (ref 2.2–3.9)
IgA: 126 mg/dL (ref 61–437)
IgG (Immunoglobin G), Serum: 811 mg/dL (ref 700–1600)
IgM, Serum: 36 mg/dL (ref 15–143)
Total Protein ELP: 6 g/dL (ref 6.0–8.5)

## 2016-04-17 NOTE — Telephone Encounter (Signed)
Dr lada Pt is still having pain in the left ear. Pt states the medication you prescribed is making him feel fatigue to the point he is not able to drive. Pt is wondering do you have something else you can prescribe like eye drops that want have him feel fatigue.

## 2016-04-17 NOTE — Telephone Encounter (Signed)
Called to inquire what he can use for his ear, reports that Dr Mike Gip checked his ear for him yesterday. Please advise

## 2016-04-17 NOTE — Telephone Encounter (Signed)
It sounds like pt needs an evaluation, either at urgent care or provider here depending on our availability; thank you

## 2016-04-17 NOTE — Telephone Encounter (Signed)
Per Dr Mike Gip, she did not check his ear, but did check his mouth. Deferred this to PCP. Patient informed and will call Dr  Sanda Klein

## 2016-04-18 ENCOUNTER — Telehealth: Payer: Self-pay | Admitting: Family Medicine

## 2016-04-18 NOTE — Telephone Encounter (Signed)
Pt is asking for a call back about he thiinks that he has inner ear( said that he has talked with the Dr before at other appts. and he can not come in for an appt for his car is broke and he has to depend on his nieces and it is hard to get them for they do not have a lot of time.

## 2016-04-18 NOTE — Telephone Encounter (Signed)
Please have him do MyChart visit

## 2016-04-20 DIAGNOSIS — M899 Disorder of bone, unspecified: Secondary | ICD-10-CM | POA: Diagnosis not present

## 2016-04-22 ENCOUNTER — Ambulatory Visit: Payer: Commercial Managed Care - HMO

## 2016-04-23 ENCOUNTER — Telehealth: Payer: Self-pay | Admitting: Hematology and Oncology

## 2016-04-23 ENCOUNTER — Other Ambulatory Visit: Payer: Self-pay | Admitting: *Deleted

## 2016-04-23 DIAGNOSIS — R634 Abnormal weight loss: Secondary | ICD-10-CM

## 2016-04-23 DIAGNOSIS — M899 Disorder of bone, unspecified: Secondary | ICD-10-CM

## 2016-04-23 DIAGNOSIS — D649 Anemia, unspecified: Secondary | ICD-10-CM

## 2016-04-23 NOTE — Telephone Encounter (Signed)
Josh said pt dropped off 24-hr urine and asked if they could tell him his lab results. Josh said his results were not normal and it looked like something Dr. Kem Parkinson nurse should talk to the patient about and so he did not feel comfortable discussing w/pt. He told patient Dr. Kem Parkinson team would call him with results. Please call. Thanks.

## 2016-04-23 NOTE — Telephone Encounter (Signed)
  Please call patient.  M 

## 2016-04-24 ENCOUNTER — Telehealth: Payer: Self-pay | Admitting: *Deleted

## 2016-04-24 ENCOUNTER — Telehealth: Payer: Self-pay | Admitting: Family Medicine

## 2016-04-24 LAB — IFE+PROTEIN ELECTRO, 24-HR UR
% BETA, Urine: 0 %
ALPHA 1 URINE: 0 %
Albumin, U: 100 %
Alpha 2, Urine: 0 %
GAMMA GLOBULIN URINE: 0 %
PDF: 0
Total Protein, Urine-Ur/day: 103 mg/24 hr (ref 30–150)
Total Protein, Urine: 5.2 mg/dL
Total Volume: 1975

## 2016-04-24 NOTE — Telephone Encounter (Signed)
Margreta Journey from [place] requesting a [facility] referral to be back dated to 04/15/16. Patient saw Dr Nolon Stalls. If your not able to back date it please allow it to start on 04/29/16. DX code is D64.9 Anemia

## 2016-04-24 NOTE — Telephone Encounter (Signed)
Called patient to inform him that he can take his medications prior to his PET scan.  He should not take any steroids before.  He can take is prednisone after he is finished with his scan and after he has eaten food.  Patient verified understanding.

## 2016-04-25 ENCOUNTER — Ambulatory Visit (INDEPENDENT_AMBULATORY_CARE_PROVIDER_SITE_OTHER): Payer: Commercial Managed Care - HMO | Admitting: Family Medicine

## 2016-04-25 ENCOUNTER — Encounter: Payer: Self-pay | Admitting: Family Medicine

## 2016-04-25 DIAGNOSIS — H6982 Other specified disorders of Eustachian tube, left ear: Secondary | ICD-10-CM | POA: Diagnosis not present

## 2016-04-25 DIAGNOSIS — I10 Essential (primary) hypertension: Secondary | ICD-10-CM

## 2016-04-25 DIAGNOSIS — R634 Abnormal weight loss: Secondary | ICD-10-CM

## 2016-04-25 DIAGNOSIS — J90 Pleural effusion, not elsewhere classified: Secondary | ICD-10-CM | POA: Diagnosis not present

## 2016-04-25 DIAGNOSIS — H6992 Unspecified Eustachian tube disorder, left ear: Secondary | ICD-10-CM

## 2016-04-25 NOTE — Patient Instructions (Signed)
Use Afrin, but just use ONE spray on the left side SPRAY -- LAY -- ROLL to the left Twice a day That should help open up that tube Do not use more than SIX doses total (3 days of medicine) Then STOP

## 2016-04-25 NOTE — Assessment & Plan Note (Addendum)
Still noted clinically; patient is asymptomatic; I reviewed [facility] notes and our hospital imaging reports and do not see a recent CXR; will send note to oncologist to make sure she is aware of the complex right-sided pleural effusion for which he underwent a thoracocentesis and 1.4 liters of fluid taken off and that it still sounds to be clinically apparent; it was noted in May and I contact his [facility] primary team; I do not see that additional fluid has been taken off

## 2016-04-25 NOTE — Assessment & Plan Note (Signed)
Well controlled 

## 2016-04-25 NOTE — Assessment & Plan Note (Signed)
Spray, lay, roll technique discussed; use just one spray of afrin instead of 2-3, no more then 6 total doses

## 2016-04-25 NOTE — Telephone Encounter (Signed)
error 

## 2016-04-25 NOTE — Progress Notes (Signed)
BP 120/60   Pulse 67   Temp 97.7 F (36.5 C)   Resp 14   Wt 128 lb 7 oz (58.3 kg)   SpO2 92%   BMI 19.53 kg/m    Subjective:    Patient ID: Matthew Brown, male    DOB: [birth year], 75 y.o.   MRN: [id number]  HPI: Matthew Brown is a 75 y.o. male  Chief Complaint  Patient presents with  . Ear Pain    both ears   He is having ear issue; he used to work at [employer]; the doctor told him that soap was getting in the ear and was irritating; he stopped that; he still gets it every so often; happening in the right ear too; makes him dizzy; no ringing in the ear; when he has it, things get to moving and it gets bad; very little nausea; some postnasal drip, congestion; no fevers now, but had some times where he wakes and shirt is damp; cancer doctor knows about that; no lumps in the neck; no sore throat; no double vision; one side of body does not tingle, but has the AV fistula on the right forearm  He has been losing weight; his appetite is poor; eating one meal a day; his oncologist is getting a PET scan next week to look for cancer; she also did a lot of blood work and asked him about his thyroid He has been having pain in his back; he also had pain in the groin and saw a specialist, also saw urologist, checked out the groin; no lumps or bumps in the groin; urinating okay; they did a 24 hour urine on him and just turned it in Wed; they did a CT scan of the back, plus an MRI he says; he gave the disc to the cancer doctor We reviewed his CT scan of his knee and he actually has a quadriceps tear (partial); he was not aware of that, so he can contact his orthopaedist about that issue; he has pain on the lower right side of the back and has an MRI pending, ordered by ortho; last ortho note reviewed which says patient has multiple myeloma  He has had fluid in his lungs; his [facility] doctors adjusted his fluid medicine; he does not have any fluid in his legs any more; note from kidney doctor at [facility]  reviewed; patient had a complex right-sided pleural effusion with 1.4 liters of fluid removed back in January 2017 Copied from [place]: Evaluated by interventional pulm who performed a thoracentesis on his complex right sided pleural effusion, removed 1.4 L fluid, transudative. Pleural fluid culture showed NGTD upon discharge. Subsequent improvement in hypoxia.  The effusion was noted in May and I contacted the transplant coordinator at [facility] about it and they said they would take over  He also had a-fib at that time, but could not start anticoagulation because of GI bleeding  He has been seen by GI to repeat EGD to assess for underlying malignancy (per August 2017 note); I see the date of EGD on June 2nd, but I cannot open the document in [place]; diagnosis listed as "duodenal mass" on June 5th; there is a phone note June 28th: Telephone Encounter - Lisbeth Renshaw, MD - 12/19/2015 9:41 AM EDT "I called the patient in response to a message I received. I reported the results of the pathology from his last EGD which was unremarkable and answered his questions about the upcoming EGD for resection of the  submucosal duodenal mass."  He went back on July 5th and underwent EGD with biopsies: Findings:   The esophagus was normal.   The stomach was normal.   A few 5 to 12 mm mucosal nodules with a localized distribution were    found in the duodenal bulb and in the second portion of the duodenum.    These were soft to comression with a biopsy forceps. Biopsies were taken    with a cold forceps for histology.     Impression:- Normal esophagus.   - Normal stomach.   - Mucosal nodules found in the duodenum. Biopsied. 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  Recommendation:- Patient has a contact number  available for emergencies.    The signs and symptoms of potential delayed complications    were discussed with the patient. Return to normal    activities tomorrow. Written discharge instructions were    provided to the patient.   - Continue present medications.   - Resume previous diet.   - Await pathology results. -------------------------- I reviewed lab results in [place], most recent done April 04, 2016 Last creatinine 1.56, stable over the last 2 months H/H 10.0/29.4, actually improved over last 2 months, from 9.5 to 9.8 to 10.0 Calcium 10.7, stable over last 4 months (10.7, 10.8, 10.1, 10.5, 10.7) Phos low at 2.4 I could not find a TSH or free T4 Previous PSA on August 07, 2015 was normal at 0.36  Depression screen [facility] 2/9 04/25/2016 10/19/2015 06/05/2015  Decreased Interest 0 0 0  Down, Depressed, Hopeless 0 0 0  PHQ - 2 Score 0 0 0   Relevant past medical, surgical, family and social history reviewed Past Medical History:  Diagnosis Date  . Benign prostatic hypertrophy   . Chronic headache 10/19/2015  . ED (erectile dysfunction)   . End stage renal disease ([place])   . Essential hypertension   . GERD (gastroesophageal reflux disease)   . GIB (gastrointestinal bleeding)    a. 02/3817 s/p R colic artery embolization;  b. 02/2015 EGD: duod ulcerative mass->Bx notable for coagulative necrosis - ? ischemia vs thrombosis-->coumadin d/c'd.  . Gout   . Hearing loss   . Hemorrhoids   . Hyperlipidemia   . Osteoarthrosis, unspecified whether generalized or localized, lower leg   . Persistent atrial fibrillation ([place])    a. CHA2DS2VASc = 3-->coumadin d/c'd 02/2015 2/2 recurrent GIB.  [name] Prostatitis   . Pulmonary hypertension    a. 10/2014 Echo: EF 60-65%, mild to mod MR, mildly dil LA, nl RV, PASP 6mHg.  .Marland KitchenRenal transplant recipient   . Ulcers  of both great toes ([facility]    Past Surgical History:  Procedure Laterality Date  . BACK SURGERY    . ESOPHAGOGASTRODUODENOSCOPY  03/13/15   severe esophagitis, ulcerated mass  . HERNIA REPAIR  1974  . PROSTATE ABLATION    . STOMACH SURGERY     blood vessel burst  . THROAT SURGERY    . TOTAL KNEE ARTHROPLASTY     Family History  Problem Relation Age of Onset  . Cancer Mother     throat  . Diabetes Brother   . Heart disease Brother   . Stroke Brother   . Hypertension Brother   . Diabetes Sister   . Heart disease Sister   . Hypertension Sister   . Diabetes Sister   . Diabetes Brother   . COPD Neg Hx   . Kidney disease Neg Hx   . Prostate  cancer Neg Hx    Social History  Substance Use Topics  . Smoking status: Former Smoker    Packs/day: 1.00    Years: 25.00    Types: Cigarettes    Quit date: 06/23/1978  . Smokeless tobacco: Never Used  . Alcohol use No   Interim medical history since last visit reviewed. Allergies and medications reviewed  Review of Systems Per HPI unless specifically indicated above     Objective:    BP 120/60   Pulse 67   Temp 97.7 F (36.5 C)   Resp 14   Wt 128 lb 7 oz (58.3 kg)   SpO2 92%   BMI 19.53 kg/m   Wt Readings from Last 3 Encounters:  04/25/16 128 lb 7 oz (58.3 kg)  04/15/16 138 lb 2 oz (62.7 kg)  10/19/15 168 lb (76.2 kg)    Physical Exam  Constitutional: He appears well-developed and well-nourished. No distress.  Weight loss of 40 pounds in 5 months  HENT:  Head: Normocephalic and atraumatic.  Right Ear: Hearing, tympanic membrane, external ear and ear canal normal.  Left Ear: Hearing, external ear and ear canal normal. A middle ear effusion (cloudy fluid behind the LEFT TM) is present.  Ears:  Nose: No mucosal edema or rhinorrhea.  Mouth/Throat: Oropharynx is clear and moist.  Eyes: EOM are normal. No scleral icterus.  Neck: No thyromegaly present.  Cardiovascular: Normal rate and regular rhythm.   Pulmonary/Chest:  Effort normal. No accessory muscle usage. No respiratory distress. He has decreased breath sounds in the right middle field and the right lower field. He has no wheezes. He has no rhonchi.  Abdominal: Soft. Bowel sounds are normal. He exhibits no distension.  Musculoskeletal: He exhibits no edema.  Diminished muscle mass thigh; AV fistula right radial forearm with palpable thrill  Neurological: He is alert. He displays no tremor. Coordination normal.  Skin: Skin is warm and dry. No rash noted. He is not diaphoretic. No pallor.  Nailbeds slightly paler than those of examiner  Psychiatric: He has a normal mood and affect. His behavior is normal. Judgment and thought content normal. His mood appears not anxious. He does not exhibit a depressed mood.  Very pleasant   Results for orders placed or performed in visit on 04/23/16  IFE+PROTEIN ELECTRO, 24-HR UR  Result Value Ref Range   Total Protein, Urine 5.2 Not Estab. mg/dL   Total Protein, Urine-Ur/day 103 30 - 150 mg/24 hr   Albumin, U 100.0 %   ALPHA 1 URINE 0.0 %   Alpha 2, Urine 0.0 %   % BETA, Urine 0.0 %   GAMMA GLOBULIN URINE 0.0 %   M-SPIKE %, Urine Not Observed Not Observed %   Immunofixation Result, Urine Comment    Note: Comment    Total Volume 1,975    PDF .       Assessment & Plan:   Problem List Items Addressed This Visit      Cardiovascular and Mediastinum   Essential hypertension    Well-controlled        Respiratory   Pleural effusion, right    Still noted clinically; patient is asymptomatic; I reviewed [facility] notes and our hospital imaging reports and do not see a recent CXR; will send note to oncologist to make sure she is aware of the complex right-sided pleural effusion for which he underwent a thoracocentesis and 1.4 liters of fluid taken off and that it still sounds to be clinically apparent; it was  noted in May and I contact his [facility] primary team; I do not see that additional fluid has been taken off         Nervous and Auditory   Eustachian tube dysfunction, left    Spray, lay, roll technique discussed; use just one spray of afrin instead of 2-3, no more then 6 total doses        Other   Weight loss    Worrisome, obviously, for malignancy; discussed work-up thus far, and PET scan coming up on Monday; offered medicine to help with appetite, which patient politely declined, will talk to oncologist; I could not find any thyroid testing, but will defer that to oncologist if no malignancy to explain weight loss; he is not tachycardic today       Other Visit Diagnoses   None.     Follow up plan: Return if symptoms worsen or fail to improve.  An after-visit summary was printed and given to the patient at [place].  Please see the patient instructions which may contain other information and recommendations beyond what is mentioned above in the assessment and plan.

## 2016-04-25 NOTE — Telephone Encounter (Signed)
Referral has been placed and authorization number is [id number] from 04/29/2016 through 10/26/2016.

## 2016-04-25 NOTE — Assessment & Plan Note (Signed)
Worrisome, obviously, for malignancy; discussed work-up thus far, and PET scan coming up on Monday; offered medicine to help with appetite, which patient politely declined, will talk to oncologist; I could not find any thyroid testing, but will defer that to oncologist if no malignancy to explain weight loss; he is not tachycardic today

## 2016-04-28 ENCOUNTER — Observation Stay
Admission: EM | Admit: 2016-04-28 | Discharge: 2016-04-30 | Disposition: A | Discharge status: Home / Self Care | Payer: Commercial Managed Care - HMO | Attending: Internal Medicine | Admitting: Internal Medicine

## 2016-04-28 ENCOUNTER — Ambulatory Visit
Admission: RE | Admit: 2016-04-28 | Discharge: 2016-04-28 | Disposition: A | Discharge status: Home / Self Care | Payer: Commercial Managed Care - HMO | Source: Ambulatory Visit | Attending: Hematology and Oncology | Admitting: Hematology and Oncology

## 2016-04-28 ENCOUNTER — Emergency Department: Payer: Commercial Managed Care - HMO

## 2016-04-28 ENCOUNTER — Encounter: Payer: Self-pay | Admitting: Emergency Medicine

## 2016-04-28 DIAGNOSIS — I482 Chronic atrial fibrillation: Secondary | ICD-10-CM | POA: Diagnosis not present

## 2016-04-28 DIAGNOSIS — Z96659 Presence of unspecified artificial knee joint: Secondary | ICD-10-CM | POA: Insufficient documentation

## 2016-04-28 DIAGNOSIS — M109 Gout, unspecified: Secondary | ICD-10-CM | POA: Insufficient documentation

## 2016-04-28 DIAGNOSIS — K219 Gastro-esophageal reflux disease without esophagitis: Secondary | ICD-10-CM | POA: Diagnosis not present

## 2016-04-28 DIAGNOSIS — C787 Secondary malignant neoplasm of liver and intrahepatic bile duct: Secondary | ICD-10-CM | POA: Insufficient documentation

## 2016-04-28 DIAGNOSIS — Z833 Family history of diabetes mellitus: Secondary | ICD-10-CM | POA: Insufficient documentation

## 2016-04-28 DIAGNOSIS — M1990 Unspecified osteoarthritis, unspecified site: Secondary | ICD-10-CM | POA: Insufficient documentation

## 2016-04-28 DIAGNOSIS — Z940 Kidney transplant status: Secondary | ICD-10-CM | POA: Diagnosis not present

## 2016-04-28 DIAGNOSIS — N400 Enlarged prostate without lower urinary tract symptoms: Secondary | ICD-10-CM | POA: Diagnosis not present

## 2016-04-28 DIAGNOSIS — J9383 Other pneumothorax: Principal | ICD-10-CM

## 2016-04-28 DIAGNOSIS — I272 Pulmonary hypertension, unspecified: Secondary | ICD-10-CM | POA: Insufficient documentation

## 2016-04-28 DIAGNOSIS — N529 Male erectile dysfunction, unspecified: Secondary | ICD-10-CM | POA: Diagnosis not present

## 2016-04-28 DIAGNOSIS — I481 Persistent atrial fibrillation: Secondary | ICD-10-CM | POA: Insufficient documentation

## 2016-04-28 DIAGNOSIS — E871 Hypo-osmolality and hyponatremia: Secondary | ICD-10-CM | POA: Diagnosis not present

## 2016-04-28 DIAGNOSIS — M5134 Other intervertebral disc degeneration, thoracic region: Secondary | ICD-10-CM | POA: Insufficient documentation

## 2016-04-28 DIAGNOSIS — K5900 Constipation, unspecified: Secondary | ICD-10-CM | POA: Insufficient documentation

## 2016-04-28 DIAGNOSIS — Z09 Encounter for follow-up examination after completed treatment for conditions other than malignant neoplasm: Secondary | ICD-10-CM

## 2016-04-28 DIAGNOSIS — R599 Enlarged lymph nodes, unspecified: Secondary | ICD-10-CM

## 2016-04-28 DIAGNOSIS — D509 Iron deficiency anemia, unspecified: Secondary | ICD-10-CM | POA: Insufficient documentation

## 2016-04-28 DIAGNOSIS — E785 Hyperlipidemia, unspecified: Secondary | ICD-10-CM | POA: Diagnosis not present

## 2016-04-28 DIAGNOSIS — J939 Pneumothorax, unspecified: Secondary | ICD-10-CM

## 2016-04-28 DIAGNOSIS — N186 End stage renal disease: Secondary | ICD-10-CM | POA: Insufficient documentation

## 2016-04-28 DIAGNOSIS — Z800 Family history of malignant neoplasm of digestive organs: Secondary | ICD-10-CM | POA: Diagnosis not present

## 2016-04-28 DIAGNOSIS — Z87891 Personal history of nicotine dependence: Secondary | ICD-10-CM | POA: Insufficient documentation

## 2016-04-28 DIAGNOSIS — I10 Essential (primary) hypertension: Secondary | ICD-10-CM | POA: Diagnosis present

## 2016-04-28 DIAGNOSIS — R948 Abnormal results of function studies of other organs and systems: Secondary | ICD-10-CM

## 2016-04-28 DIAGNOSIS — M545 Low back pain: Secondary | ICD-10-CM | POA: Diagnosis not present

## 2016-04-28 DIAGNOSIS — I4891 Unspecified atrial fibrillation: Secondary | ICD-10-CM | POA: Diagnosis present

## 2016-04-28 DIAGNOSIS — C8590 Non-Hodgkin lymphoma, unspecified, unspecified site: Secondary | ICD-10-CM | POA: Diagnosis not present

## 2016-04-28 DIAGNOSIS — R591 Generalized enlarged lymph nodes: Secondary | ICD-10-CM

## 2016-04-28 DIAGNOSIS — Z823 Family history of stroke: Secondary | ICD-10-CM | POA: Insufficient documentation

## 2016-04-28 DIAGNOSIS — M899 Disorder of bone, unspecified: Secondary | ICD-10-CM

## 2016-04-28 DIAGNOSIS — R633 Feeding difficulties: Secondary | ICD-10-CM | POA: Insufficient documentation

## 2016-04-28 DIAGNOSIS — C7951 Secondary malignant neoplasm of bone: Secondary | ICD-10-CM | POA: Diagnosis not present

## 2016-04-28 DIAGNOSIS — R634 Abnormal weight loss: Secondary | ICD-10-CM

## 2016-04-28 DIAGNOSIS — Z8249 Family history of ischemic heart disease and other diseases of the circulatory system: Secondary | ICD-10-CM | POA: Insufficient documentation

## 2016-04-28 DIAGNOSIS — I120 Hypertensive chronic kidney disease with stage 5 chronic kidney disease or end stage renal disease: Secondary | ICD-10-CM | POA: Insufficient documentation

## 2016-04-28 LAB — CBC
HCT: 31.9 % — ABNORMAL LOW (ref 40.0–52.0)
HEMOGLOBIN: 10.5 g/dL — AB (ref 13.0–18.0)
MCH: 24.3 pg — AB (ref 26.0–34.0)
MCHC: 33 g/dL (ref 32.0–36.0)
MCV: 73.8 fL — AB (ref 80.0–100.0)
PLATELETS: 229 10*3/uL (ref 150–440)
RBC: 4.33 MIL/uL — AB (ref 4.40–5.90)
RDW: 18.7 % — ABNORMAL HIGH (ref 11.5–14.5)
WBC: 8.7 10*3/uL (ref 3.8–10.6)

## 2016-04-28 LAB — BASIC METABOLIC PANEL
ANION GAP: 12 (ref 5–15)
BUN: 41 mg/dL — AB (ref 6–20)
CHLORIDE: 86 mmol/L — AB (ref 101–111)
CO2: 27 mmol/L (ref 22–32)
Calcium: 11.4 mg/dL — ABNORMAL HIGH (ref 8.9–10.3)
Creatinine, Ser: 1.85 mg/dL — ABNORMAL HIGH (ref 0.61–1.24)
GFR calc Af Amer: 39 mL/min — ABNORMAL LOW (ref >60)
GFR, EST NON AFRICAN AMERICAN: 34 mL/min — AB (ref >60)
GLUCOSE: 90 mg/dL (ref 65–99)
POTASSIUM: 4.9 mmol/L (ref 3.5–5.1)
SODIUM: 125 mmol/L — AB (ref 135–145)

## 2016-04-28 LAB — GLUCOSE, CAPILLARY: Glucose-Capillary: 106 mg/dL — ABNORMAL HIGH (ref 65–99)

## 2016-04-28 LAB — PROTIME-INR
INR: 1.17
PROTHROMBIN TIME: 15 s (ref 11.4–15.2)

## 2016-04-28 MED ORDER — FLUDEOXYGLUCOSE F - 18 (FDG) INJECTION
12.0000 | Freq: Once | INTRAVENOUS | Status: AC | PRN
  Administered 2016-04-28: 12.5 via INTRAVENOUS

## 2016-04-28 NOTE — ED Provider Notes (Signed)
[facility] Emergency Department Provider Note  ____________________________________________  Time seen: Approximately 9:39 PM  I have reviewed the triage vital signs and the nursing notes.   HISTORY  Chief Complaint No chief complaint on file.   HPI Matthew Brown is a 75 y.o. male who is sent in by Dr. Mike Gip for R sided PTX. Patient is being evaluated for malignancy due to weight loss and underwent a PET scan today. He later this evening he received a phone call from from his oncologist as the PET scan showed a right moderate size pneumothorax. Patient denies chest pain or shortness of breath, dizziness. He is not on anticoagulation. No prior h/o of PTX.   Past Medical History:  Diagnosis Date  . Benign prostatic hypertrophy   . Chronic headache 10/19/2015  . ED (erectile dysfunction)   . End stage renal disease ([place])   . Essential hypertension   . GERD (gastroesophageal reflux disease)   . GIB (gastrointestinal bleeding)    a. [id number] s/p R colic artery embolization;  b. 02/2015 EGD: duod ulcerative mass->Bx notable for coagulative necrosis - ? ischemia vs thrombosis-->coumadin d/c'd.  . Gout   . Hearing loss   . Hemorrhoids   . Hyperlipidemia   . Osteoarthrosis, unspecified whether generalized or localized, lower leg   . Persistent atrial fibrillation ([place])    a. CHA2DS2VASc = 3-->coumadin d/c'd 02/2015 2/2 recurrent GIB.  [name] Prostatitis   . Pulmonary hypertension    a. 10/2014 Echo: EF 60-65%, mild to mod MR, mildly dil LA, nl RV, PASP 40mmHg.  [name] Renal transplant recipient   . Ulcers of both great toes [facility])     Patient Active Problem List   Diagnosis Date Noted  . Anemia 04/29/2016  . Lymphoma ([place]) 04/28/2016  . Pneumothorax 04/28/2016  . Hyponatremia 04/28/2016  . Adenopathy 04/15/2016  . Bone disease 04/15/2016  . Weight loss 04/15/2016  . Spinal stenosis of cervical region 11/21/2015  . Pleural effusion, right 11/21/2015  . Right  groin pain 10/19/2015  . Chronic headache 10/19/2015  . Hip strain 10/09/2015  . BPH with obstruction/lower urinary tract symptoms 09/03/2015  . Prostatitis, chronic 09/03/2015  . Erectile dysfunction of organic origin 09/03/2015  . Difficulty urinating 08/14/2015  . Accumulation of fluid in tissues 07/18/2015  . Persistent atrial fibrillation ([place])   . Pain in finger of right hand 06/15/2015  . Eustachian tube dysfunction, left 06/06/2015  . Chronic insomnia 06/06/2015  . H/O: upper GI bleed 06/06/2015  . Pleural cavity effusion 04/07/2015  . Abdominal pain, generalized 04/04/2015  . Pneumonia due to Haemophilus influenzae ([place]) 03/16/2015  . Encounter for therapeutic drug monitoring 07/27/2013  . Long term (current) use of anticoagulants 09/25/2010  . Hyperlipidemia 10/25/2008  . Essential hypertension 10/25/2008  . ATRIAL FIBRILLATION 10/25/2008  . GERD 10/25/2008  . RENAL FAILURE, END STAGE 10/25/2008  . Osteoarthrosis, unspecified whether generalized or localized, involving lower leg 10/25/2008  . BENIGN PROSTATIC HYPERTROPHY, HX OF 10/25/2008    Past Surgical History:  Procedure Laterality Date  . BACK SURGERY    . ESOPHAGOGASTRODUODENOSCOPY  03/13/15   severe esophagitis, ulcerated mass  . HERNIA REPAIR  1974  . PROSTATE ABLATION    . STOMACH SURGERY     blood vessel burst  . THROAT SURGERY    . TOTAL KNEE ARTHROPLASTY      Prior to Admission medications   Medication Sig Start Date End Date Taking? Authorizing Provider  albuterol (PROAIR HFA) 108 (90 BASE)  MCG/ACT inhaler Inhale into the lungs. 02/20/14  Yes Historical Provider, MD  allopurinol (ZYLOPRIM) 100 MG tablet Take 100 mg by mouth daily.     Yes Historical Provider, MD  finasteride (PROSCAR) 5 MG tablet Take 5 mg by mouth daily.     Yes Historical Provider, MD  furosemide (LASIX) 80 MG tablet TAKE 1 TABLET (80 MG TOTAL) BY MOUTH TWO (2) TIMES A DAY. 08/22/15  Yes Historical Provider, MD  hydroxypropyl  methylcellulose (ISOPTO TEARS) 2.5 % ophthalmic solution Place 1 drop into both eyes as needed.    Yes Historical Provider, MD  magnesium oxide (MAG-OX) 400 (241.3 Mg) MG tablet TAKE 1 TABLET (400 MG TOTAL) BY MOUTH TWO (2) TIMES A DAY. 07/22/15  Yes Historical Provider, MD  meclizine (ANTIVERT) 25 MG tablet Take 1 tablet (25 mg total) by mouth 3 (three) times daily as needed for dizziness. 03/25/16  Yes Arnetha Courser, MD  metoprolol tartrate (LOPRESSOR) 25 MG tablet TAKE 1 TABLET (25 MG TOTAL) BY MOUTH TWO (2) TIMES A DAY. 08/22/15  Yes Historical Provider, MD  Multiple Vitamin (MULTIVITAMIN) tablet Take 1 tablet by mouth daily.     Yes Historical Provider, MD  mycophenolate (MYFORTIC) 180 MG EC tablet Take 2 tablets twice a day, V42.0 kidney transplant, tx date 11/05/04, DAW1, brand medically necessary 10/13/14  Yes Historical Provider, MD  omeprazole (PRILOSEC) 20 MG capsule Take 20 mg by mouth 2 (two) times daily before a meal.  12/19/14  Yes Historical Provider, MD  oxybutynin (DITROPAN-XL) 5 MG 24 hr tablet Take 5 mg by mouth daily.   Yes Historical Provider, MD  oxyCODONE-acetaminophen (ROXICET) 5-325 MG tablet Take 0.5-1 tablets by mouth every 6 (six) hours as needed for severe pain. 01/03/16  Yes Arnetha Courser, MD  predniSONE (DELTASONE) 5 MG tablet Take 5 mg by mouth daily with breakfast.  11/23/13  Yes Historical Provider, MD  sucralfate (CARAFATE) 1 G tablet Take 1 g by mouth 4 (four) times daily. 04/18/15  Yes Historical Provider, MD  tacrolimus (PROGRAF) 1 MG capsule Take 1 mg by mouth 3 (three) times daily. Reported on 08/16/2015   Yes Historical Provider, MD  Tamsulosin HCl (FLOMAX) 0.4 MG CAPS Take 0.4 mg by mouth daily.     Yes Historical Provider, MD  lactulose (CHRONULAC) 10 GM/15ML solution Take 7.5-15 mLs (5-10 g total) by mouth daily as needed for mild constipation. Patient not taking: Reported on 04/28/2016 11/14/15   Arnetha Courser, MD    Allergies Patient has no known  allergies.  Family History  Problem Relation Age of Onset  . Cancer Mother     throat  . Diabetes Brother   . Heart disease Brother   . Stroke Brother   . Hypertension Brother   . Diabetes Sister   . Heart disease Sister   . Hypertension Sister   . Diabetes Sister   . Diabetes Brother   . COPD Neg Hx   . Kidney disease Neg Hx   . Prostate cancer Neg Hx     Social History Social History  Substance Use Topics  . Smoking status: Former Smoker    Packs/day: 1.00    Years: 25.00    Types: Cigarettes    Quit date: 06/23/1978  . Smokeless tobacco: Never Used  . Alcohol use No    Review of Systems Constitutional: Negative for fever. Eyes: Negative for visual changes. ENT: Negative for sore throat. Cardiovascular: Negative for chest pain. Respiratory: Negative for shortness of breath.  Gastrointestinal: Negative for abdominal pain, vomiting or diarrhea. Genitourinary: Negative for dysuria. Musculoskeletal: Negative for back pain. Skin: Negative for rash. Neurological: Negative for headaches, weakness or numbness.  ____________________________________________   PHYSICAL EXAM:  VITAL SIGNS: ED Triage Vitals  Enc Vitals Group     BP 04/28/16 2020 (!) 122/55     Pulse Rate 04/28/16 2020 84     Resp 04/28/16 2020 18     Temp 04/28/16 2020 98.1 F (36.7 C)     Temp Source 04/28/16 2020 Oral     SpO2 04/28/16 2020 99 %     Weight 04/28/16 2020 128 lb (58.1 kg)     Height 04/28/16 2020 5\' 8"  (1.727 m)     Head Circumference --      Peak Flow --      Pain Score 04/28/16 2021 8     Pain Loc --      Pain Edu? --      Excl. in [place]? --     Constitutional: Alert and oriented. Well appearing and in no apparent distress. HEENT:      Head: Normocephalic and atraumatic.         Eyes: Conjunctivae are normal. Sclera is non-icteric. EOMI. PERRL      Mouth/Throat: Mucous membranes are moist.       Neck: Supple with no signs of meningismus. Cardiovascular: Regular rate and  rhythm. No murmurs, gallops, or rubs. 2+ symmetrical distal pulses are present in all extremities. No JVD. Respiratory: Normal respiratory effort. Lungs are clear to auscultation bilaterally with diminished but present breath sounds on the R. No wheezes, crackles, or rhonchi.  Gastrointestinal: Soft, non tender, and non distended with positive bowel sounds. No rebound or guarding. Musculoskeletal: Nontender with normal range of motion in all extremities. No edema, cyanosis, or erythema of extremities. Neurologic: Normal speech and language. Face is symmetric. Moving all extremities. No gross focal neurologic deficits are appreciated. Skin: Skin is warm, dry and intact. No rash noted. Psychiatric: Mood and affect are normal. Speech and behavior are normal.  ____________________________________________   LABS (all labs ordered are listed, but only abnormal results are displayed)  Labs Reviewed  CBC - Abnormal; Notable for the following:       Result Value   RBC 4.33 (*)    Hemoglobin 10.5 (*)    HCT 31.9 (*)    MCV 73.8 (*)    MCH 24.3 (*)    RDW 18.7 (*)    All other components within normal limits  BASIC METABOLIC PANEL - Abnormal; Notable for the following:    Sodium 125 (*)    Chloride 86 (*)    BUN 41 (*)    Creatinine, Ser 1.85 (*)    Calcium 11.4 (*)    GFR calc non Af Amer 34 (*)    GFR calc Af Amer 39 (*)    All other components within normal limits  BASIC METABOLIC PANEL - Abnormal; Notable for the following:    Sodium 127 (*)    Chloride 87 (*)    BUN 42 (*)    Creatinine, Ser 1.86 (*)    Calcium 11.1 (*)    GFR calc non Af Amer 34 (*)    GFR calc Af Amer 39 (*)    All other components within normal limits  CBC - Abnormal; Notable for the following:    RBC 3.94 (*)    Hemoglobin 9.8 (*)    HCT 29.5 (*)    MCV  74.9 (*)    MCH 24.8 (*)    RDW 19.1 (*)    All other components within normal limits  PROTIME-INR  BASIC METABOLIC PANEL    ____________________________________________  EKG  none ____________________________________________  RADIOLOGY  CXR: Small superior right pneumothorax  ____________________________________________   PROCEDURES  Procedure(s) performed: None Procedures Critical Care performed:  None ____________________________________________   INITIAL IMPRESSION / ASSESSMENT AND PLAN / ED COURSE   75 y.o. male who is sent in by Dr. Mike Gip for spontaneous R sided PTX. No evidence of tension pneumothorax with normal vital signs, trachea is midline, airways patent, patient has diminished breath sounds on the right however still present, he is breathing comfortably. He has no complaints at this time. We'll repeat a chest x-ray, get coags. Patient has been consented for a chest tube.  Clinical Course as of Apr 29 1826  [facility] Apr 28, 2016  2246 Repeat CXR showing small PTX. Patient remains asymptomatic and HD stable. I discussed the findings with Dr. Hampton Abbot who recommended placing patient on oxygen and holding off chest tube as patient is stable and asymptomatic and admit to Hospitalist. He will follow patient closely.   [CV]    Clinical Course User Index [CV] Rudene Re, MD    Pertinent labs & imaging results that were available during my care of the patient were reviewed by me and considered in my medical decision making (see chart for details).    ____________________________________________   FINAL CLINICAL IMPRESSION(S) / ED DIAGNOSES  Final diagnoses:  Spontaneous pneumothorax  Hyponatremia      NEW MEDICATIONS STARTED DURING THIS VISIT:  Current Discharge Medication List       Note:  This document was prepared using Dragon voice recognition software and may include unintentional dictation errors.    Rudene Re, MD 04/29/16 1827

## 2016-04-28 NOTE — H&P (Signed)
[facility] NAME: Jaewon Winzer    MR#:  [id number]  DATE OF BIRTH:  August 30, 1940  DATE OF ADMISSION:  04/28/2016  PRIMARY CARE PHYSICIAN: Enid Derry, MD   REQUESTING/REFERRING PHYSICIAN: Alfred Levins, MD  CHIEF COMPLAINT:  Pneumothorax  HISTORY OF PRESENT ILLNESS:  Bridget Woznick  is a 75 y.o. male who presents with Pneumothorax. Patient is being worked up by oncology for masses seen on imaging. He had a PET scan done which showed moderate pneumothorax. Oncologist called him and told him to come to the hospital. Chest x-ray here showed small pneumothorax. Surgery was consulted and does not feel like the patient needs intervention at this point. He has some hyponatremia and hospitalists were called for admission observation on their service. He was also scheduled to see oncology today, requests consult for the same.  PAST MEDICAL HISTORY:   Past Medical History:  Diagnosis Date  . Benign prostatic hypertrophy   . Chronic headache 10/19/2015  . ED (erectile dysfunction)   . End stage renal disease ([place])   . Essential hypertension   . GERD (gastroesophageal reflux disease)   . GIB (gastrointestinal bleeding)    a. [id number] s/p R colic artery embolization;  b. 02/2015 EGD: duod ulcerative mass->Bx notable for coagulative necrosis - ? ischemia vs thrombosis-->coumadin d/c'd.  . Gout   . Hearing loss   . Hemorrhoids   . Hyperlipidemia   . Osteoarthrosis, unspecified whether generalized or localized, lower leg   . Persistent atrial fibrillation ([place])    a. CHA2DS2VASc = 3-->coumadin d/c'd 02/2015 2/2 recurrent GIB.  [name] Prostatitis   . Pulmonary hypertension    a. 10/2014 Echo: EF 60-65%, mild to mod MR, mildly dil LA, nl RV, PASP 62mmHg.  [name] Renal transplant recipient   . Ulcers of both great toes ([place])     PAST SURGICAL HISTORY:   Past Surgical History:  Procedure Laterality Date  . BACK SURGERY    . ESOPHAGOGASTRODUODENOSCOPY   03/13/15   severe esophagitis, ulcerated mass  . HERNIA REPAIR  1974  . PROSTATE ABLATION    . STOMACH SURGERY     blood vessel burst  . THROAT SURGERY    . TOTAL KNEE ARTHROPLASTY      SOCIAL HISTORY:   Social History  Substance Use Topics  . Smoking status: Former Smoker    Packs/day: 1.00    Years: 25.00    Types: Cigarettes    Quit date: 06/23/1978  . Smokeless tobacco: Never Used  . Alcohol use No    FAMILY HISTORY:   Family History  Problem Relation Age of Onset  . Cancer Mother     throat  . Diabetes Brother   . Heart disease Brother   . Stroke Brother   . Hypertension Brother   . Diabetes Sister   . Heart disease Sister   . Hypertension Sister   . Diabetes Sister   . Diabetes Brother   . COPD Neg Hx   . Kidney disease Neg Hx   . Prostate cancer Neg Hx     DRUG ALLERGIES:  No Known Allergies  MEDICATIONS AT HOME:   Prior to Admission medications   Medication Sig Start Date End Date Taking? Authorizing Provider  albuterol (PROAIR HFA) 108 (90 BASE) MCG/ACT inhaler Inhale into the lungs. 02/20/14  Yes Historical Provider, MD  allopurinol (ZYLOPRIM) 100 MG tablet Take 100 mg by mouth daily.     Yes Historical Provider, MD  finasteride (PROSCAR) 5 MG tablet Take 5 mg by mouth daily.     Yes Historical Provider, MD  furosemide (LASIX) 80 MG tablet TAKE 1 TABLET (80 MG TOTAL) BY MOUTH TWO (2) TIMES A DAY. 08/22/15  Yes Historical Provider, MD  hydroxypropyl methylcellulose (ISOPTO TEARS) 2.5 % ophthalmic solution Place 1 drop into both eyes as needed.    Yes Historical Provider, MD  magnesium oxide (MAG-OX) 400 (241.3 Mg) MG tablet TAKE 1 TABLET (400 MG TOTAL) BY MOUTH TWO (2) TIMES A DAY. 07/22/15  Yes Historical Provider, MD  meclizine (ANTIVERT) 25 MG tablet Take 1 tablet (25 mg total) by mouth 3 (three) times daily as needed for dizziness. 03/25/16  Yes Arnetha Courser, MD  metoprolol tartrate (LOPRESSOR) 25 MG tablet TAKE 1 TABLET (25 MG TOTAL) BY MOUTH TWO (2)  TIMES A DAY. 08/22/15  Yes Historical Provider, MD  Multiple Vitamin (MULTIVITAMIN) tablet Take 1 tablet by mouth daily.     Yes Historical Provider, MD  mycophenolate (MYFORTIC) 180 MG EC tablet Take 2 tablets twice a day, V42.0 kidney transplant, tx date 11/05/04, DAW1, brand medically necessary 10/13/14  Yes Historical Provider, MD  omeprazole (PRILOSEC) 20 MG capsule Take 20 mg by mouth 2 (two) times daily before a meal.  12/19/14  Yes Historical Provider, MD  oxybutynin (DITROPAN-XL) 5 MG 24 hr tablet Take 5 mg by mouth daily.   Yes Historical Provider, MD  oxyCODONE-acetaminophen (ROXICET) 5-325 MG tablet Take 0.5-1 tablets by mouth every 6 (six) hours as needed for severe pain. 01/03/16  Yes Arnetha Courser, MD  predniSONE (DELTASONE) 5 MG tablet Take 5 mg by mouth daily with breakfast.  11/23/13  Yes Historical Provider, MD  sucralfate (CARAFATE) 1 G tablet Take 1 g by mouth 4 (four) times daily. 04/18/15  Yes Historical Provider, MD  tacrolimus (PROGRAF) 1 MG capsule Take 1 mg by mouth 3 (three) times daily. Reported on 08/16/2015   Yes Historical Provider, MD  Tamsulosin HCl (FLOMAX) 0.4 MG CAPS Take 0.4 mg by mouth daily.     Yes Historical Provider, MD  lactulose (CHRONULAC) 10 GM/15ML solution Take 7.5-15 mLs (5-10 g total) by mouth daily as needed for mild constipation. Patient not taking: Reported on 04/28/2016 11/14/15   Arnetha Courser, MD    REVIEW OF SYSTEMS:  Review of Systems  Constitutional: Positive for malaise/fatigue and weight loss. Negative for chills and fever.  HENT: Negative for ear pain, hearing loss and tinnitus.   Eyes: Negative for blurred vision, double vision, pain and redness.  Respiratory: Negative for cough, hemoptysis and shortness of breath.   Cardiovascular: Negative for chest pain, palpitations, orthopnea and leg swelling.  Gastrointestinal: Negative for abdominal pain, constipation, diarrhea, nausea and vomiting.  Genitourinary: Negative for dysuria, frequency and  hematuria.  Musculoskeletal: Negative for back pain, joint pain and neck pain.  Skin:       No acne, rash, or lesions  Neurological: Negative for dizziness, tremors, focal weakness and weakness.  Endo/Heme/Allergies: Negative for polydipsia. Does not bruise/bleed easily.  Psychiatric/Behavioral: Negative for depression. The patient is not nervous/anxious and does not have insomnia.      VITAL SIGNS:   Vitals:   04/28/16 2130 04/28/16 2200 04/28/16 2201 04/28/16 2230  BP: (!) 154/70 (!) 131/57 (!) 131/57 (!) 147/51  Pulse: 72 77 68 67  Resp:   16   Temp:      TempSrc:      SpO2: 100% 99% 98% 99%  Weight:  Height:       Wt Readings from Last 3 Encounters:  04/28/16 58.1 kg (128 lb)  04/25/16 58.3 kg (128 lb 7 oz)  04/15/16 62.7 kg (138 lb 2 oz)    PHYSICAL EXAMINATION:  Physical Exam  Vitals reviewed. Constitutional: He is oriented to person, place, and time. He appears well-developed. No distress.  HENT:  Head: Normocephalic and atraumatic.  Mouth/Throat: Oropharynx is clear and moist.  Eyes: Conjunctivae and EOM are normal. Pupils are equal, round, and reactive to light. No scleral icterus.  Neck: Normal range of motion. Neck supple. No JVD present. No thyromegaly present.  Cardiovascular: Normal rate, regular rhythm and intact distal pulses.  Exam reveals no gallop and no friction rub.   No murmur heard. Respiratory: Effort normal and breath sounds normal. No respiratory distress. He has no wheezes. He has no rales.  GI: Soft. Bowel sounds are normal. He exhibits no distension. There is no tenderness.  Musculoskeletal: Normal range of motion. He exhibits no edema.  No arthritis, no gout  Lymphadenopathy:    He has no cervical adenopathy.  Neurological: He is alert and oriented to person, place, and time. No cranial nerve deficit.  No dysarthria, no aphasia  Skin: Skin is warm and dry. No rash noted. No erythema.  Psychiatric: He has a normal mood and affect. His  behavior is normal. Judgment and thought content normal.    LABORATORY PANEL:   CBC  Recent Labs Lab 04/28/16 2152  WBC 8.7  HGB 10.5*  HCT 31.9*  PLT 229   ------------------------------------------------------------------------------------------------------------------  Chemistries   Recent Labs Lab 04/28/16 2152  NA 125*  K 4.9  CL 86*  CO2 27  GLUCOSE 90  BUN 41*  CREATININE 1.85*  CALCIUM 11.4*   ------------------------------------------------------------------------------------------------------------------  Cardiac Enzymes No results for input(s): TROPONINI in the last 168 hours. ------------------------------------------------------------------------------------------------------------------  RADIOLOGY:  Dg Chest 2 View  Result Date: 04/28/2016 CLINICAL DATA:  Right-sided back pain.  Status post thoracentesis. EXAM: CHEST  2 VIEW COMPARISON:  Chest radiograph 04/26/2016 FINDINGS: There is a small right apical pneumothorax. Measures approximately 9 mm. Cardiomediastinal contours are unchanged. There are diffuse interstitial pulmonary markings. No overt pulmonary edema. No focal airspace consolidation. No pleural effusion. IMPRESSION: Small superior right pneumothorax. Electronically Signed   By: Ulyses Jarred M.D.   On: 04/28/2016 22:05   Nm Pet Image Initial (pi) Skull Base To Thigh  Addendum Date: 04/28/2016   ADDENDUM REPORT: 04/28/2016 17:48 ADDENDUM: Critical Value/emergent results were called by telephone at the time of interpretation on 04/28/2016 at 5:48 pm to Dr. Nolon Stalls , who verbally acknowledged these results. Electronically Signed   By: Suzy Bouchard M.D.   On: 04/28/2016 17:48   Result Date: 04/28/2016 CLINICAL DATA:  Initial Treatment strategy for abdominal lymphadenopathy. EXAM: NUCLEAR MEDICINE PET SKULL BASE TO THIGH TECHNIQUE: 12.5 mCi F-18 FDG was injected intravenously. Full-ring PET imaging was performed from the skull base to  thigh after the radiotracer. CT data was obtained and used for attenuation correction and anatomic localization. FASTING BLOOD GLUCOSE:  Value: 106 mg/dl COMPARISON:  None. FINDINGS: NECK No hypermetabolic lymph nodes in the neck. CHEST Hypermetabolic small RIGHT supraclavicular lymph node with SUV max of 5.3. Small hypermetabolic RIGHT lower paratracheal lymph node. No hypermetabolic pulmonary nodules. There is a moderate size pneumothorax on the RIGHT occupying approximately 20% of lung volume. This pneumothorax is anterior in this supine patient. No suspicious nodularity. There is airspace disease in the LEFT suggesting pneumonia or  aspiration pneumonitis. There is without metabolic activity associated this airspace disease. ABDOMEN/PELVIS There are multiple discrete hypermetabolic liver metastasis. Multiple RIGHT hepatic lobe lesions. Example lesion with SUV max equals 7.9. There is bulky hypermetabolic adenopathy in the gastrohepatic ligament with SUV max equal 10.1. Individual lymph nodes measure up to 2.5 cm short axis. There is bulky periaortic lymph node. Necrotic lymph node LEFT aorta at the level of the atrophic LEFT kidney measures 5.0 cm with SUV max equal 10.7. Lymph noted in the aorta caval position measuring 3.2 cm with SUV max 11.4. Adenopathy extends to the iliac vessels. There is bulky mesenteric adenopathy in the lower abdomen with SUV max equal 9.5 inches (image sequencing 5, series 4. There is a loop of bowel with abnormal hypermetabolic activity in the suprapubic location with SUV max equal 8.8. This concerning for involvement of the small bowel with this metastatic process. Transplant kidney in the RIGHT iliac wing. Probable small splenic metastasis. SKELETON There is a focus of hypermetabolic activity in the LEFT sacrum with SUV max equal 12.1. There is a lytic lesion with soft tissue expansion measuring 3.1 cm at this level (26, series 4). This lesion would be amenable to biopsy. Additional  metastatic lesions in the sacrum on the RIGHT and within the RIGHT femur neck. Additional hypermetabolic lesions within the lower thoracic spine and cervical spine are. IMPRESSION: 1. Widespread metastatic pattern. Bulky intensely hypermetabolic periaortic upper abdominal and mesenteric adenopathy is concerning for but not specific for high-grade lymphoma. 2. Hypermetabolic liver metastasis. 3. Hypermetabolic lesion involving the small bowel of the upper pelvis. 4. Multiple sites of hypermetabolic skeletal metastasis. Patient may be a risk for pathologic fracture of the RIGHT femoral neck. 5. Potential biopsy site in the LEFT sacrum with soft tissue lesion. 6. Small hypermetabolic RIGHT supraclavicular lymph node would also be amenable to biopsy 7. Moderate volume RIGHT pneumothorax. Potential LEFT lower lobe pneumonia or aspiration pneumonitis. Critical Value/emergent results were called initially attempted at the time of interpretation on 04/28/2016 at 4:56 pm to Dr. Sande Rives, who is covering for Dr. Mike Gip pager. Electronically Signed: By: Suzy Bouchard M.D. On: 04/28/2016 17:04    EKG:   Orders placed or performed in visit on 06/21/15  . EKG 12-Lead    IMPRESSION AND PLAN:  Principal Problem:   Pneumothorax - small, no need for intervention at this time, surgery following, monitor continuous pulse ox, repeat chest x-ray in the morning. Active Problems:   Lymphoma ([place]) - PET scan completed, lymphoma not specifically and diagnosed but suggested based on imaging, oncology consult   Hyponatremia - asymptomatic, monitor for now   Essential hypertension - continue home meds   ATRIAL FIBRILLATION - continue home meds   GERD - home dose PPI  All the records are reviewed and case discussed with ED provider. Management plans discussed with the patient and/or family.  DVT PROPHYLAXIS: SubQ lovenox  GI PROPHYLAXIS: PPI  ADMISSION STATUS: Observation  CODE STATUS: Full Code Status History     This patient does not have a recorded code status. Please follow your organizational policy for patients in this situation.    Advance Directive Documentation   Flowsheet Row Most Recent Value  Type of Advance Directive  Healthcare Power of Attorney, Living will  Pre-existing out of facility DNR order (yellow form or pink MOST form)  No data  "MOST" Form in Place?  No data      TOTAL TIME TAKING CARE OF THIS PATIENT: 40 minutes.  Simi Briel [place] 04/28/2016, 11:24 PM  [facility]  Office  [phone]  CC: Primary care physician; Enid Derry, MD

## 2016-04-28 NOTE — ED Triage Notes (Signed)
Pt presents to ED with c/o "fluid in my lungs." Pt reports was sent by PCP to have fluid removed, pt c/o right sided back pain. Pt has dialysis shunt on right arm but reports no longer needs dialysis.

## 2016-04-29 ENCOUNTER — Observation Stay: Payer: Commercial Managed Care - HMO

## 2016-04-29 ENCOUNTER — Other Ambulatory Visit: Payer: Commercial Managed Care - HMO

## 2016-04-29 ENCOUNTER — Inpatient Hospital Stay: Payer: Commercial Managed Care - HMO | Admitting: Hematology and Oncology

## 2016-04-29 ENCOUNTER — Encounter: Payer: Self-pay | Admitting: Hematology and Oncology

## 2016-04-29 DIAGNOSIS — R590 Localized enlarged lymph nodes: Secondary | ICD-10-CM

## 2016-04-29 DIAGNOSIS — E871 Hypo-osmolality and hyponatremia: Secondary | ICD-10-CM

## 2016-04-29 DIAGNOSIS — I4891 Unspecified atrial fibrillation: Secondary | ICD-10-CM

## 2016-04-29 DIAGNOSIS — Z7952 Long term (current) use of systemic steroids: Secondary | ICD-10-CM

## 2016-04-29 DIAGNOSIS — M899 Disorder of bone, unspecified: Secondary | ICD-10-CM

## 2016-04-29 DIAGNOSIS — E785 Hyperlipidemia, unspecified: Secondary | ICD-10-CM

## 2016-04-29 DIAGNOSIS — D649 Anemia, unspecified: Secondary | ICD-10-CM

## 2016-04-29 DIAGNOSIS — M109 Gout, unspecified: Secondary | ICD-10-CM

## 2016-04-29 DIAGNOSIS — N529 Male erectile dysfunction, unspecified: Secondary | ICD-10-CM

## 2016-04-29 DIAGNOSIS — Z808 Family history of malignant neoplasm of other organs or systems: Secondary | ICD-10-CM

## 2016-04-29 DIAGNOSIS — Z79899 Other long term (current) drug therapy: Secondary | ICD-10-CM

## 2016-04-29 DIAGNOSIS — J9311 Primary spontaneous pneumothorax: Secondary | ICD-10-CM

## 2016-04-29 DIAGNOSIS — M545 Low back pain: Secondary | ICD-10-CM | POA: Diagnosis not present

## 2016-04-29 DIAGNOSIS — J939 Pneumothorax, unspecified: Secondary | ICD-10-CM

## 2016-04-29 DIAGNOSIS — K219 Gastro-esophageal reflux disease without esophagitis: Secondary | ICD-10-CM

## 2016-04-29 DIAGNOSIS — R630 Anorexia: Secondary | ICD-10-CM

## 2016-04-29 DIAGNOSIS — I272 Pulmonary hypertension, unspecified: Secondary | ICD-10-CM

## 2016-04-29 DIAGNOSIS — N400 Enlarged prostate without lower urinary tract symptoms: Secondary | ICD-10-CM

## 2016-04-29 DIAGNOSIS — Z940 Kidney transplant status: Secondary | ICD-10-CM

## 2016-04-29 DIAGNOSIS — Z87891 Personal history of nicotine dependence: Secondary | ICD-10-CM

## 2016-04-29 DIAGNOSIS — I129 Hypertensive chronic kidney disease with stage 1 through stage 4 chronic kidney disease, or unspecified chronic kidney disease: Secondary | ICD-10-CM

## 2016-04-29 DIAGNOSIS — N186 End stage renal disease: Secondary | ICD-10-CM

## 2016-04-29 DIAGNOSIS — N289 Disorder of kidney and ureter, unspecified: Secondary | ICD-10-CM

## 2016-04-29 DIAGNOSIS — K769 Liver disease, unspecified: Secondary | ICD-10-CM

## 2016-04-29 DIAGNOSIS — M1990 Unspecified osteoarthritis, unspecified site: Secondary | ICD-10-CM

## 2016-04-29 LAB — BASIC METABOLIC PANEL
Anion gap: 13 (ref 5–15)
BUN: 42 mg/dL — ABNORMAL HIGH (ref 6–20)
CALCIUM: 11.1 mg/dL — AB (ref 8.9–10.3)
CHLORIDE: 87 mmol/L — AB (ref 101–111)
CO2: 27 mmol/L (ref 22–32)
CREATININE: 1.86 mg/dL — AB (ref 0.61–1.24)
GFR calc non Af Amer: 34 mL/min — ABNORMAL LOW (ref >60)
GFR, EST AFRICAN AMERICAN: 39 mL/min — AB (ref >60)
GLUCOSE: 93 mg/dL (ref 65–99)
Potassium: 5 mmol/L (ref 3.5–5.1)
Sodium: 127 mmol/L — ABNORMAL LOW (ref 135–145)

## 2016-04-29 LAB — CBC
HCT: 29.5 % — ABNORMAL LOW (ref 40.0–52.0)
Hemoglobin: 9.8 g/dL — ABNORMAL LOW (ref 13.0–18.0)
MCH: 24.8 pg — AB (ref 26.0–34.0)
MCHC: 33.1 g/dL (ref 32.0–36.0)
MCV: 74.9 fL — AB (ref 80.0–100.0)
PLATELETS: 228 10*3/uL (ref 150–440)
RBC: 3.94 MIL/uL — AB (ref 4.40–5.90)
RDW: 19.1 % — ABNORMAL HIGH (ref 11.5–14.5)
WBC: 8.2 10*3/uL (ref 3.8–10.6)

## 2016-04-29 LAB — APTT: aPTT: 40 seconds — ABNORMAL HIGH (ref 24–36)

## 2016-04-29 LAB — PROTIME-INR
INR: 1.2
Prothrombin Time: 15.3 seconds — ABNORMAL HIGH (ref 11.4–15.2)

## 2016-04-29 MED ORDER — ENOXAPARIN SODIUM 30 MG/0.3ML ~~LOC~~ SOLN
30.0000 mg | Freq: Every day | SUBCUTANEOUS | Status: DC
  Filled 2016-04-29: qty 0.3

## 2016-04-29 MED ORDER — OXYCODONE-ACETAMINOPHEN 5-325 MG PO TABS
0.5000 | ORAL_TABLET | Freq: Four times a day (QID) | ORAL | Status: DC | PRN
  Administered 2016-04-29 (×3): 1 via ORAL
  Filled 2016-04-29 (×3): qty 1

## 2016-04-29 MED ORDER — DIPHENHYDRAMINE HCL 25 MG PO CAPS: 25.0000 mg | ORAL_CAPSULE | Freq: Every evening | ORAL | Status: DC | PRN

## 2016-04-29 MED ORDER — ONDANSETRON HCL 4 MG PO TABS
4.0000 mg | ORAL_TABLET | Freq: Four times a day (QID) | ORAL | Status: DC | PRN
  Administered 2016-04-29: 4 mg via ORAL
  Filled 2016-04-29: qty 1

## 2016-04-29 MED ORDER — ACETAMINOPHEN 650 MG RE SUPP: 650.0000 mg | Freq: Four times a day (QID) | RECTAL | Status: DC | PRN

## 2016-04-29 MED ORDER — TACROLIMUS 1 MG PO CAPS
3.0000 mg | ORAL_CAPSULE | Freq: Two times a day (BID) | ORAL | Status: DC
  Administered 2016-04-29 – 2016-04-30 (×2): 3 mg via ORAL
  Filled 2016-04-29 (×4): qty 3

## 2016-04-29 MED ORDER — SODIUM CHLORIDE 0.9% FLUSH
3.0000 mL | Freq: Two times a day (BID) | INTRAVENOUS | Status: DC
  Administered 2016-04-29 (×3): 3 mL via INTRAVENOUS

## 2016-04-29 MED ORDER — TAMSULOSIN HCL 0.4 MG PO CAPS
0.4000 mg | ORAL_CAPSULE | Freq: Every day | ORAL | Status: DC
  Administered 2016-04-29 – 2016-04-30 (×2): 0.4 mg via ORAL
  Filled 2016-04-29 (×2): qty 1

## 2016-04-29 MED ORDER — FUROSEMIDE 40 MG PO TABS
80.0000 mg | ORAL_TABLET | Freq: Two times a day (BID) | ORAL | Status: DC
  Administered 2016-04-29: 80 mg via ORAL
  Filled 2016-04-29: qty 2

## 2016-04-29 MED ORDER — PREDNISONE 2.5 MG PO TABS
5.0000 mg | ORAL_TABLET | Freq: Every day | ORAL | Status: DC
  Administered 2016-04-29: 2.5 mg via ORAL
  Administered 2016-04-30 (×2): 5 mg via ORAL
  Filled 2016-04-29 (×3): qty 2

## 2016-04-29 MED ORDER — SUCRALFATE 1 G PO TABS
1.0000 g | ORAL_TABLET | Freq: Three times a day (TID) | ORAL | Status: DC
  Administered 2016-04-29 – 2016-04-30 (×6): 1 g via ORAL
  Filled 2016-04-29 (×6): qty 1

## 2016-04-29 MED ORDER — ACETAMINOPHEN 325 MG PO TABS: 650.0000 mg | ORAL_TABLET | Freq: Four times a day (QID) | ORAL | Status: DC | PRN

## 2016-04-29 MED ORDER — METOPROLOL TARTRATE 25 MG PO TABS
25.0000 mg | ORAL_TABLET | Freq: Two times a day (BID) | ORAL | Status: DC
  Administered 2016-04-29 (×3): 25 mg via ORAL
  Filled 2016-04-29 (×3): qty 1

## 2016-04-29 MED ORDER — ORAL CARE MOUTH RINSE: 15.0000 mL | Freq: Two times a day (BID) | OROMUCOSAL | Status: DC

## 2016-04-29 MED ORDER — ONDANSETRON HCL 4 MG/2ML IJ SOLN: 4.0000 mg | Freq: Four times a day (QID) | INTRAMUSCULAR | Status: DC | PRN

## 2016-04-29 MED ORDER — PANTOPRAZOLE SODIUM 40 MG PO TBEC
40.0000 mg | DELAYED_RELEASE_TABLET | Freq: Two times a day (BID) | ORAL | Status: DC
  Administered 2016-04-29 – 2016-04-30 (×4): 40 mg via ORAL
  Filled 2016-04-29 (×4): qty 1

## 2016-04-29 MED ORDER — ENSURE ENLIVE PO LIQD
237.0000 mL | Freq: Two times a day (BID) | ORAL | Status: DC
  Administered 2016-04-29: 237 mL via ORAL

## 2016-04-29 MED ORDER — OXYCODONE-ACETAMINOPHEN 5-325 MG PO TABS
0.5000 | ORAL_TABLET | ORAL | Status: DC | PRN
  Administered 2016-04-29 – 2016-04-30 (×3): 1 via ORAL
  Filled 2016-04-29 (×3): qty 1

## 2016-04-29 MED ORDER — MYCOPHENOLATE SODIUM 180 MG PO TBEC
360.0000 mg | DELAYED_RELEASE_TABLET | Freq: Two times a day (BID) | ORAL | Status: DC
  Administered 2016-04-29 – 2016-04-30 (×2): 360 mg via ORAL
  Filled 2016-04-29 (×3): qty 2

## 2016-04-29 MED ORDER — FINASTERIDE 5 MG PO TABS
5.0000 mg | ORAL_TABLET | Freq: Every day | ORAL | Status: DC
Start: 2016-04-29 — End: 2016-04-30
  Administered 2016-04-29 – 2016-04-30 (×2): 5 mg via ORAL
  Filled 2016-04-29 (×2): qty 1

## 2016-04-29 NOTE — Progress Notes (Signed)
Patient is refusing to take lovenox. Pt states that he does not feel comfortable taking blood thinners because " in the past I had a blood vessel that burst. Pt educated about the importance of Lovenox. Pt still refusing. Will continue to monitor.   Matthew Brown M

## 2016-04-29 NOTE — Progress Notes (Signed)
MD notified. Pt complaining of pain. Orders to change prn medication frequency. I will continue to assess.

## 2016-04-29 NOTE — Care Management (Signed)
Patient placed in observation due to what appears to be a spontaneous pneumothorax. Patient was sent to ED by pcp for fluid in his chest.  He is being worked up by  [facility]  for malignancy. PET performed 11/6 showed a pneumothorax and was sent to the ED. Has been evaluated by surgery and at present there is no need for chest tube. Awaiting consult by oncology. Patient significant other of 30 years- Erskine Squibb is present.

## 2016-04-29 NOTE — Progress Notes (Signed)
Initial Nutrition Assessment  DOCUMENTATION CODES:   Severe malnutrition in context of chronic illness  INTERVENTION:  1. Ensure Enlive po BID, each supplement provides 350 kcal and 20 grams of protein  NUTRITION DIAGNOSIS:   Malnutrition related to chronic illness as evidenced by severe depletion of muscle mass, severe depletion of body fat.  GOAL:   Patient will meet greater than or equal to 90% of their needs  MONITOR:   PO intake, I & O's, Weight trends  REASON FOR ASSESSMENT:   Malnutrition Screening Tool    ASSESSMENT:   Matthew Brown  is a 75 y.o. male who presents with Pneumothorax. Patient is being worked up by oncology for masses seen on imaging. He had a PET scan done which showed moderate pneumothorax. Oncologist called him and told him to come to the hospital.  Spoke with Matthew Brown at bedside. He admits to poor appetite at home 2/2 pain. He states hsi pain got so bad that he had no desire to eat. Despite this, he was still consuming one "good sized" meal per day. He admits to losing significant weight, but his reported amounts and time spans were difficult to understand. Per chart he exhibits a 50#/30% severe wt loss over 7 months, likely related to malignancy. He was agreeable to trying Ensure during his stay. Did not eat any breakfast this morning. Stated his breakfast tray was brought right as he was going to the bathroom and then someone came and spoke to him, as a result his food got cold and he did not eat it.  Nutrition-Focused physical exam completed. Findings are severe fat depletion, severe muscle depletion, and no edema.   Labs and medications reviewed: Na 127 Prednisone  Diet Order:  Diet Heart Room service appropriate? Yes; Fluid consistency: Thin  Skin:  Reviewed, no issues  Last BM:  04/28/2016  Height:   Ht Readings from Last 1 Encounters:  04/28/16 5\' 8"  (1.727 m)    Weight:   Wt Readings from Last 1 Encounters:  04/29/16 118 lb  4.8 oz (53.7 kg)    Ideal Body Weight:  70 kg  BMI:  Body mass index is 17.99 kg/m.  Estimated Nutritional Needs:   Kcal:  1880-2150 calories  Protein:  70-91 gm  Fluid:  >/= 1.9L  EDUCATION NEEDS:   No education needs identified at this time  Matthew Brown. Matthew Truss, MS, RD LDN Inpatient Clinical Dietitian Pager [phone]

## 2016-04-29 NOTE — Progress Notes (Signed)
Pharmacist - Prescriber Communication  Lovenox 40 mg subcutaneously every 24 hours has been changed to Lovenox 30 mg subcutaneously every 24 hours for creatinine clearance less than 30 mL/min according to the Cockcroft-Gault equation.  Dynisha Due A. [place], [place].D., BCPS Clinical Pharmacist 04/29/2016 0131

## 2016-04-29 NOTE — Consult Note (Addendum)
Date of Consultation:  04/29/2016  Requesting Physician:   Rudene Re, MD  Reason for Consultation:  Right pneumothorax  History of Present Illness: Matthew Brown is a 75 y.o. male presenting to the ED with a right pneumothorax.  He reports that he has a history of fluid in his right lung in the past.  Searching through the medical records, he has been worked up recently for bony lesions and has seen Dr. Mike Gip with Oncology for further work up.  PET/CT scan was performed today which showed lymphadenopathy with liver lesions and bony lesions and a right pneumothorax.  Patient denies any recent procedures and denies any falls or trauma to the right chest.  Denies any shortness of breath and has been ambulating with a cane without any issues.    CXR on admission to the ED revealed a small right pneumothorax.  He has been on room air, with O2 saturation of 100%.  Past Medical History: Past Medical History:  Diagnosis Date  . Benign prostatic hypertrophy   . Chronic headache 10/19/2015  . ED (erectile dysfunction)   . End stage renal disease ([place])   . Essential hypertension   . GERD (gastroesophageal reflux disease)   . GIB (gastrointestinal bleeding)    a. [id number] s/p R colic artery embolization;  b. 02/2015 EGD: duod ulcerative mass->Bx notable for coagulative necrosis - ? ischemia vs thrombosis-->coumadin d/c'd.  . Gout   . Hearing loss   . Hemorrhoids   . Hyperlipidemia   . Osteoarthrosis, unspecified whether generalized or localized, lower leg   . Persistent atrial fibrillation ([place])    a. CHA2DS2VASc = 3-->coumadin d/c'd 02/2015 2/2 recurrent GIB.  [name] Prostatitis   . Pulmonary hypertension    a. 10/2014 Echo: EF 60-65%, mild to mod MR, mildly dil LA, nl RV, PASP 55mmHg.  [name] Renal transplant recipient   . Ulcers of both great toes [facility])      Past Surgical History: Past Surgical History:  Procedure Laterality Date  . BACK SURGERY    . ESOPHAGOGASTRODUODENOSCOPY  03/13/15    severe esophagitis, ulcerated mass  . HERNIA REPAIR  1974  . PROSTATE ABLATION    . STOMACH SURGERY     blood vessel burst  . THROAT SURGERY    . TOTAL KNEE ARTHROPLASTY      Home Medications: Prior to Admission medications   Medication Sig Start Date End Date Taking? Authorizing Provider  albuterol (PROAIR HFA) 108 (90 BASE) MCG/ACT inhaler Inhale into the lungs. 02/20/14  Yes Historical Provider, MD  allopurinol (ZYLOPRIM) 100 MG tablet Take 100 mg by mouth daily.     Yes Historical Provider, MD  finasteride (PROSCAR) 5 MG tablet Take 5 mg by mouth daily.     Yes Historical Provider, MD  furosemide (LASIX) 80 MG tablet TAKE 1 TABLET (80 MG TOTAL) BY MOUTH TWO (2) TIMES A DAY. 08/22/15  Yes Historical Provider, MD  hydroxypropyl methylcellulose (ISOPTO TEARS) 2.5 % ophthalmic solution Place 1 drop into both eyes as needed.    Yes Historical Provider, MD  magnesium oxide (MAG-OX) 400 (241.3 Mg) MG tablet TAKE 1 TABLET (400 MG TOTAL) BY MOUTH TWO (2) TIMES A DAY. 07/22/15  Yes Historical Provider, MD  meclizine (ANTIVERT) 25 MG tablet Take 1 tablet (25 mg total) by mouth 3 (three) times daily as needed for dizziness. 03/25/16  Yes Arnetha Courser, MD  metoprolol tartrate (LOPRESSOR) 25 MG tablet TAKE 1 TABLET (25 MG TOTAL) BY MOUTH TWO (2) TIMES A  DAY. 08/22/15  Yes Historical Provider, MD  Multiple Vitamin (MULTIVITAMIN) tablet Take 1 tablet by mouth daily.     Yes Historical Provider, MD  mycophenolate (MYFORTIC) 180 MG EC tablet Take 2 tablets twice a day, V42.0 kidney transplant, tx date 11/05/04, DAW1, brand medically necessary 10/13/14  Yes Historical Provider, MD  omeprazole (PRILOSEC) 20 MG capsule Take 20 mg by mouth 2 (two) times daily before a meal.  12/19/14  Yes Historical Provider, MD  oxybutynin (DITROPAN-XL) 5 MG 24 hr tablet Take 5 mg by mouth daily.   Yes Historical Provider, MD  oxyCODONE-acetaminophen (ROXICET) 5-325 MG tablet Take 0.5-1 tablets by mouth every 6 (six) hours as needed  for severe pain. 01/03/16  Yes Arnetha Courser, MD  predniSONE (DELTASONE) 5 MG tablet Take 5 mg by mouth daily with breakfast.  11/23/13  Yes Historical Provider, MD  sucralfate (CARAFATE) 1 G tablet Take 1 g by mouth 4 (four) times daily. 04/18/15  Yes Historical Provider, MD  tacrolimus (PROGRAF) 1 MG capsule Take 1 mg by mouth 3 (three) times daily. Reported on 08/16/2015   Yes Historical Provider, MD  Tamsulosin HCl (FLOMAX) 0.4 MG CAPS Take 0.4 mg by mouth daily.     Yes Historical Provider, MD  lactulose (CHRONULAC) 10 GM/15ML solution Take 7.5-15 mLs (5-10 g total) by mouth daily as needed for mild constipation. Patient not taking: Reported on 04/28/2016 11/14/15   Arnetha Courser, MD    Allergies: No Known Allergies  Social History:  reports that he quit smoking about 37 years ago. His smoking use included Cigarettes. He has a 25.00 pack-year smoking history. He has never used smokeless tobacco. He reports that he does not drink alcohol or use drugs.   Family History: Family History  Problem Relation Age of Onset  . Cancer Mother     throat  . Diabetes Brother   . Heart disease Brother   . Stroke Brother   . Hypertension Brother   . Diabetes Sister   . Heart disease Sister   . Hypertension Sister   . Diabetes Sister   . Diabetes Brother   . COPD Neg Hx   . Kidney disease Neg Hx   . Prostate cancer Neg Hx     Review of Systems: ROS  Physical Exam BP (!) 144/55   Pulse 80   Temp 98.1 F (36.7 C) (Oral)   Resp 18   Ht 5\' 8"  (1.727 m)   Wt 58.1 kg (128 lb)   SpO2 97%   BMI 19.46 kg/m  CONSTITUTIONAL: No acute distress HEENT:  Normocephalic, atraumatic, extraocular motion intact. NECK: Trachea is midline, and there is no jugular venous distension. LYMPH NODES:  Lymph nodes in the neck are not enlarged. RESPIRATORY:  Lungs are clear, and breath sounds are equal bilaterally. Normal respiratory effort without pathologic use of accessory muscles. CARDIOVASCULAR: Heart is  regular without murmurs, gallops, or rubs. GI: The abdomen is soft, non-distended, non-tender to palpation.  Patient has right lower quadrant incision from renal transplant, which has healed well. MUSCULOSKELETAL:  Normal muscle strength and tone in all four extremities.  No peripheral edema or cyanosis. SKIN: Skin turgor is normal. There are no pathologic skin lesions.  NEUROLOGIC:  Motor and sensation is grossly normal.  Cranial nerves are grossly intact. PSYCH:  Alert and oriented to person, place and time. Affect is normal.  Laboratory Analysis: Results for orders placed or performed during the hospital encounter of 04/28/16 (from the past 24 hour(s))  CBC     Status: Abnormal   Collection Time: 04/28/16  9:52 PM  Result Value Ref Range   WBC 8.7 3.8 - 10.6 K/uL   RBC 4.33 (L) 4.40 - 5.90 MIL/uL   Hemoglobin 10.5 (L) 13.0 - 18.0 g/dL   HCT 31.9 (L) 40.0 - 52.0 %   MCV 73.8 (L) 80.0 - 100.0 fL   MCH 24.3 (L) 26.0 - 34.0 pg   MCHC 33.0 32.0 - 36.0 g/dL   RDW 18.7 (H) 11.5 - 14.5 %   Platelets 229 150 - 440 K/uL  Basic metabolic panel     Status: Abnormal   Collection Time: 04/28/16  9:52 PM  Result Value Ref Range   Sodium 125 (L) 135 - 145 mmol/L   Potassium 4.9 3.5 - 5.1 mmol/L   Chloride 86 (L) 101 - 111 mmol/L   CO2 27 22 - 32 mmol/L   Glucose, Bld 90 65 - 99 mg/dL   BUN 41 (H) 6 - 20 mg/dL   Creatinine, Ser 1.85 (H) 0.61 - 1.24 mg/dL   Calcium 11.4 (H) 8.9 - 10.3 mg/dL   GFR calc non Af Amer 34 (L) >60 mL/min   GFR calc Af Amer 39 (L) >60 mL/min   Anion gap 12 5 - 15  Protime-INR     Status: None   Collection Time: 04/28/16  9:52 PM  Result Value Ref Range   Prothrombin Time 15.0 11.4 - 15.2 seconds   INR 1.17     Imaging: Dg Chest 2 View  Result Date: 04/28/2016 CLINICAL DATA:  Right-sided back pain.  Status post thoracentesis. EXAM: CHEST  2 VIEW COMPARISON:  Chest radiograph 04/26/2016 FINDINGS: There is a small right apical pneumothorax. Measures approximately  9 mm. Cardiomediastinal contours are unchanged. There are diffuse interstitial pulmonary markings. No overt pulmonary edema. No focal airspace consolidation. No pleural effusion. IMPRESSION: Small superior right pneumothorax. Electronically Signed   By: Ulyses Jarred M.D.   On: 04/28/2016 22:05   Nm Pet Image Initial (pi) Skull Base To Thigh  Addendum Date: 04/28/2016   ADDENDUM REPORT: 04/28/2016 17:48 ADDENDUM: Critical Value/emergent results were called by telephone at the time of interpretation on 04/28/2016 at 5:48 pm to Dr. Nolon Stalls , who verbally acknowledged these results. Electronically Signed   By: Suzy Bouchard M.D.   On: 04/28/2016 17:48   Result Date: 04/28/2016 CLINICAL DATA:  Initial Treatment strategy for abdominal lymphadenopathy. EXAM: NUCLEAR MEDICINE PET SKULL BASE TO THIGH TECHNIQUE: 12.5 mCi F-18 FDG was injected intravenously. Full-ring PET imaging was performed from the skull base to thigh after the radiotracer. CT data was obtained and used for attenuation correction and anatomic localization. FASTING BLOOD GLUCOSE:  Value: 106 mg/dl COMPARISON:  None. FINDINGS: NECK No hypermetabolic lymph nodes in the neck. CHEST Hypermetabolic small RIGHT supraclavicular lymph node with SUV max of 5.3. Small hypermetabolic RIGHT lower paratracheal lymph node. No hypermetabolic pulmonary nodules. There is a moderate size pneumothorax on the RIGHT occupying approximately 20% of lung volume. This pneumothorax is anterior in this supine patient. No suspicious nodularity. There is airspace disease in the LEFT suggesting pneumonia or aspiration pneumonitis. There is without metabolic activity associated this airspace disease. ABDOMEN/PELVIS There are multiple discrete hypermetabolic liver metastasis. Multiple RIGHT hepatic lobe lesions. Example lesion with SUV max equals 7.9. There is bulky hypermetabolic adenopathy in the gastrohepatic ligament with SUV max equal 10.1. Individual lymph nodes  measure up to 2.5 cm short axis. There is bulky periaortic lymph node.  Necrotic lymph node LEFT aorta at the level of the atrophic LEFT kidney measures 5.0 cm with SUV max equal 10.7. Lymph noted in the aorta caval position measuring 3.2 cm with SUV max 11.4. Adenopathy extends to the iliac vessels. There is bulky mesenteric adenopathy in the lower abdomen with SUV max equal 9.5 inches (image sequencing 5, series 4. There is a loop of bowel with abnormal hypermetabolic activity in the suprapubic location with SUV max equal 8.8. This concerning for involvement of the small bowel with this metastatic process. Transplant kidney in the RIGHT iliac wing. Probable small splenic metastasis. SKELETON There is a focus of hypermetabolic activity in the LEFT sacrum with SUV max equal 12.1. There is a lytic lesion with soft tissue expansion measuring 3.1 cm at this level (26, series 4). This lesion would be amenable to biopsy. Additional metastatic lesions in the sacrum on the RIGHT and within the RIGHT femur neck. Additional hypermetabolic lesions within the lower thoracic spine and cervical spine are. IMPRESSION: 1. Widespread metastatic pattern. Bulky intensely hypermetabolic periaortic upper abdominal and mesenteric adenopathy is concerning for but not specific for high-grade lymphoma. 2. Hypermetabolic liver metastasis. 3. Hypermetabolic lesion involving the small bowel of the upper pelvis. 4. Multiple sites of hypermetabolic skeletal metastasis. Patient may be a risk for pathologic fracture of the RIGHT femoral neck. 5. Potential biopsy site in the LEFT sacrum with soft tissue lesion. 6. Small hypermetabolic RIGHT supraclavicular lymph node would also be amenable to biopsy 7. Moderate volume RIGHT pneumothorax. Potential LEFT lower lobe pneumonia or aspiration pneumonitis. Critical Value/emergent results were called initially attempted at the time of interpretation on 04/28/2016 at 4:56 pm to Dr. Sande Rives, who is covering  for Dr. Mike Gip pager. Electronically Signed: By: Suzy Bouchard M.D. On: 04/28/2016 17:04    Assessment and Plan: This is a 75 y.o. male who presents with an asymptomatic right pneumothorax.  The patient is currently being worked up for lymphadenopathy with liver and bony lesions and had PET/CT done today.    As the patient is asymptomatic and with O2 saturation of 100% on room air, would recommend conservative management of the right pneumothorax.  The patient should have daily CXR to assess the pneumothorax.  The patient understands that if there is any worsening and the pneumothorax is enlarging, or if there is any clinical change with decreased O2 saturation or worsening shortness of breath, that he may require a chest tube placement.  The patient will be admitted to the Hospitalist team and Surgery will follow along.  The patient understands this plan and all of his questions have been answered.   Melvyn Neth, [place]

## 2016-04-29 NOTE — Progress Notes (Signed)
[name] NAME: Matthew Brown    MR#:  [id number]  DATE OF BIRTH:  09/25/1940  SUBJECTIVE:  CHIEF COMPLAINT:  No chief complaint on file.  No complaint. REVIEW OF SYSTEMS:  Review of Systems  Constitutional: Negative for chills and fever.  Eyes: Negative for blurred vision and double vision.  Respiratory: Negative for cough, hemoptysis, shortness of breath and stridor.   Cardiovascular: Negative for chest pain and leg swelling.  Gastrointestinal: Negative for constipation, diarrhea, nausea and vomiting.  Genitourinary: Negative for dysuria and hematuria.  Musculoskeletal: Negative for joint pain.  Skin: Negative for itching and rash.  Neurological: Negative for focal weakness, loss of consciousness and headaches.  Psychiatric/Behavioral: Negative for depression. The patient is not nervous/anxious.     DRUG ALLERGIES:  No Known Allergies VITALS:  Blood pressure (!) 129/53, pulse 68, temperature 97.7 F (36.5 C), temperature source Oral, resp. rate 16, height 5\' 8"  (1.727 m), weight 118 lb 4.8 oz (53.7 kg), SpO2 100 %. PHYSICAL EXAMINATION:  Physical Exam  Constitutional: He is oriented to person, place, and time and well-developed, well-nourished, and in no distress.  HENT:  Head: Normocephalic.  Mouth/Throat: Oropharynx is clear and moist.  Eyes: Conjunctivae and EOM are normal.  Neck: Normal range of motion. Neck supple. No JVD present. No tracheal deviation present.  Cardiovascular: Normal rate, regular rhythm and normal heart sounds.  Exam reveals no gallop.   No murmur heard. Pulmonary/Chest: Effort normal and breath sounds normal. No respiratory distress. He has no wheezes. He has no rales.  Abdominal: Soft. Bowel sounds are normal. He exhibits no distension. There is no tenderness.  Musculoskeletal: Normal range of motion. He exhibits no edema or tenderness.  Neurological: He is alert and oriented to person, place,  and time. No cranial nerve deficit.  Skin: No rash noted. No erythema.   LABORATORY PANEL:   CBC  Recent Labs Lab 04/29/16 0413  WBC 8.2  HGB 9.8*  HCT 29.5*  PLT 228   ------------------------------------------------------------------------------------------------------------------ Chemistries   Recent Labs Lab 04/29/16 0413  NA 127*  K 5.0  CL 87*  CO2 27  GLUCOSE 93  BUN 42*  CREATININE 1.86*  CALCIUM 11.1*   RADIOLOGY:  Dg Chest 2 View  Result Date: 04/28/2016 CLINICAL DATA:  Right-sided back pain.  Status post thoracentesis. EXAM: CHEST  2 VIEW COMPARISON:  Chest radiograph 04/26/2016 FINDINGS: There is a small right apical pneumothorax. Measures approximately 9 mm. Cardiomediastinal contours are unchanged. There are diffuse interstitial pulmonary markings. No overt pulmonary edema. No focal airspace consolidation. No pleural effusion. IMPRESSION: Small superior right pneumothorax. Electronically Signed   By: Ulyses Jarred M.D.   On: 04/28/2016 22:05   Dg Chest Port 1 View  Result Date: 04/29/2016 CLINICAL DATA:  Follow-up right-sided pneumothorax. History of pulmonary hypertension. Patient is undergoing workup for lymphadenopathy, liver and bony lesions. EXAM: PORTABLE CHEST 1 VIEW COMPARISON:  PA and lateral chest x-ray of April 28, 2016 FINDINGS: An approximately 5-10% right apical pneumothorax persists. The lungs are reasonably well inflated. There is no pleural effusion. No alveolar infiltrate or pulmonary mass is observed. The heart is top-normal in size. The pulmonary vascularity is not clearly engorged. There is calcification in the wall of the aortic arch. There is multilevel degenerative disc disease of the thoracic spine. There degenerative changes of the right shoulder. IMPRESSION: Stable 5-10% right apical pneumothorax. Top-normal cardiac size without pulmonary edema. Aortic atherosclerosis. Electronically Signed  By: David  Martinique M.D.   On: 04/29/2016 10:12    ASSESSMENT AND PLAN:   Pneumothorax - small,  chest x-ray shows improvement. Repeat chest x-ray tomorrow morning.    Lymphoma ([place]) - PET scan completed, lymphoma not specifically and diagnosed but suggested based on imaging, Dr. Genevive Bi recommend a percutaneous biopsy was retroperitoneal lymphadenopathy. Follow-up oncology consult   Hyponatremia. Na is better but still low at 127, hold the Lasix, follow-up BMP.     Essential hypertension - continue home meds    chronic ATRIAL FIBRILLATION -rate controlled, continue Lopressor.   GERD - home dose PPI  All the records are reviewed and case discussed with Care Management/Social Worker. Management plans discussed with the patient, family and they are in agreement.  CODE STATUS: Full code  TOTAL TIME TAKING CARE OF THIS PATIENT: 36 minutes.   More than 50% of the time was spent in counseling/coordination of care: YES  POSSIBLE D/C IN 1-2 DAYS, DEPENDING ON CLINICAL CONDITION.   Demetrios Loll M.D on 04/29/2016 at 2:35 PM  Between 7am to 6pm - Pager - [phone]  After 6pm go to [email or web address] - Proofreader  [facility]  Office  [phone]  CC: Primary care physician; Enid Derry, MD  Note: This dictation was prepared with Dragon dictation along with smaller phrase technology. Any transcriptional errors that result from this process are unintentional.

## 2016-04-29 NOTE — Care Management Obs Status (Signed)
[facility] NOTIFICATION   Patient Details  Name: Matthew Brown MRN: [id number] Date of Birth: 1941-03-12   Medicare Observation Status Notification Given:  Yes Signed by wife.  Patient sleeping   Katrina Stack, RN 04/29/2016, 4:00 PM

## 2016-04-29 NOTE — Progress Notes (Signed)
  Patient ID: Matthew Brown, male   DOB: 02/03/1941, 75 y.o.   MRN: [id number]  HISTORY: I had a chance today to see Matthew Brown and his family. He is a 75 year old gentleman with a probable diagnosis of lymphoma who is status post renal transplant approximately 10 years ago. His kidney is functioning well and he remains on immunosuppression. He was admitted to the hospital after a PET scan had been performed showing an incidental right pneumothorax. He was evaluated by our surgical department and found to be asymptomatic and a chest tube was not placed. Today he remains asymptomatic. His only complaint is that he has difficulty eating secondary to the pain in his back. I did get a chance to discuss his care with Dr. Mike Gip today and the plan is to perform a CT-guided needle biopsy of the retroperitoneal lymphadenopathy.   Vitals:   04/29/16 0340 04/29/16 1143  BP: (!) 132/47 (!) 129/53  Pulse: 65 68  Resp: 16   Temp: 98.2 F (36.8 C) 97.7 F (36.5 C)     EXAM:    Resp: Lungs are clear On the left but diminished on the right with some mechanical breath sounds..  No respiratory distress, normal effort. Heart:  Regular without murmurs Abd:  Abdomen is soft, non distended and non tender. No masses are palpable.  There is no rebound and no guarding.  Neurological: Alert and oriented to person, place, and time. Coordination normal.  Skin: Skin is warm and dry. No rash noted. No diaphoretic. No erythema. No pallor.  Psychiatric: Normal mood and affect. Normal behavior. Judgment and thought content normal.    ASSESSMENT: I did order a chest x-ray for today and I reviewed that. I do believe that chest x-ray shows improvement in the right-sided pneumothorax.   PLAN:   I discussed his care with the patient and his family. I also discussed his care with Dr. Mike Gip. I believe that the chest x-ray shows improvement and this can be followed. I would recommend a percutaneous biopsy was  retroperitoneal lymphadenopathy as you are planning. The patient will get his records from [facility] and have them sent to my office. I gave the patient my business card and asked him to have the transplant coordinator foreword to me all the x-rays are available. We will continue to follow the patient with you.    Nestor Lewandowsky, MD

## 2016-04-30 ENCOUNTER — Observation Stay: Payer: Commercial Managed Care - HMO

## 2016-04-30 DIAGNOSIS — R948 Abnormal results of function studies of other organs and systems: Secondary | ICD-10-CM

## 2016-04-30 DIAGNOSIS — C8330 Diffuse large B-cell lymphoma, unspecified site: Secondary | ICD-10-CM | POA: Insufficient documentation

## 2016-04-30 LAB — BASIC METABOLIC PANEL
Anion gap: 10 (ref 5–15)
BUN: 52 mg/dL — AB (ref 6–20)
CALCIUM: 11.2 mg/dL — AB (ref 8.9–10.3)
CO2: 30 mmol/L (ref 22–32)
CREATININE: 1.93 mg/dL — AB (ref 0.61–1.24)
Chloride: 86 mmol/L — ABNORMAL LOW (ref 101–111)
GFR calc Af Amer: 37 mL/min — ABNORMAL LOW (ref >60)
GFR, EST NON AFRICAN AMERICAN: 32 mL/min — AB (ref >60)
GLUCOSE: 114 mg/dL — AB (ref 65–99)
Potassium: 5.1 mmol/L (ref 3.5–5.1)
Sodium: 126 mmol/L — ABNORMAL LOW (ref 135–145)

## 2016-04-30 MED ORDER — MIDAZOLAM HCL 5 MG/5ML IJ SOLN
INTRAMUSCULAR | Status: AC
  Filled 2016-04-30: qty 5

## 2016-04-30 MED ORDER — FENTANYL CITRATE (PF) 100 MCG/2ML IJ SOLN
INTRAMUSCULAR | Status: AC
  Filled 2016-04-30: qty 2

## 2016-04-30 MED ORDER — BUPIVACAINE HCL (PF) 0.25 % IJ SOLN
INTRAMUSCULAR | Status: AC
  Filled 2016-04-30: qty 30

## 2016-04-30 MED ORDER — SODIUM CHLORIDE 0.9 % IV SOLN
INTRAVENOUS | Status: DC
  Administered 2016-04-30: 18:00:00 via INTRAVENOUS

## 2016-04-30 NOTE — Consult Note (Signed)
[facility]  Date of admission:  04/28/2016  Inpatient day:  04/29/2016  Consulting physician:  Dr. Lance Coon   Reason for Consultation:  Pneumothorax, prior schedule to see Dr. Mike Gip, working up possible lymphoma.  Chief Complaint: Matthew Brown is a 75 y.o. male s/p renal transplant 10 years ago who was adnitted through the emergnecy room with a pneumothorax.  HPI:  The patient notes increasing low back pain since 01/2016.  He has had a 30 pound weight loss (168 pounds to 138 pounds).  Pain has caused him not to eat. He has had some fever and sweats off and on. He denies any drenching sweats. He has had several infections.   MRI of the lumbar spine at [facility] on 04/07/2016 revealed multi-focal bone lesions within the thoracic and lumbar spine and sacrum with a differential including lymphoma, metastatic disease, and multiple myeloma. There was multifocal abdominal lymphadenopathy.  There was a 1.1 cm exophytic lesion at the upper pole of the atrophic native right kidney. There was a transplant kidney in the right iliac fossa.   He was seen in consultation in the medical oncology clinic on 04/15/2016.  A work-up was initiated.  Labs revealed no monoclonal protein.  He was scheduled for PET scan to assess extent of disease and determine a site for biopsy.  PET scan on 04/28/2016 revealed a widespread metastatic pattern.There was bulky intensely hypermetabolic periaortic upper abdominal and mesenteric adenopathy is concerning for high-grade lymphoma.  There were hypermetabolic liver metastasis.  There was hypermetabolic lesion involving the small bowel of the upper pelvis.  There were multiple sites of hypermetabolic skeletal metastasis. Patient may be a risk for pathologic fracture of the RIGHT femoral neck.  There was a potential biopsy site in the LEFT sacrum with soft tissue lesion.  There was small hypermetabolic RIGHT supraclavicular lymph node that might also  be amenable to biopsy  There was moderate volume RIGHT pneumothorax.  There was potential LEFT lower lobe pneumonia or aspiration pneumonitis.  I was called for critical pneumothorax.  The patient was contacted vis phone.  He denied any respiratory symptoms.  He only noted chronic pain.  He was directed to the emergency room.  Follow-up films reveal improving pneumothorax.  Dr. Genevive Bi has seen the patient.  He does not feel that a chest tube is needed.   Past Medical History:  Diagnosis Date  . Benign prostatic hypertrophy   . Chronic headache 10/19/2015  . ED (erectile dysfunction)   . End stage renal disease ([place])   . Essential hypertension   . GERD (gastroesophageal reflux disease)   . GIB (gastrointestinal bleeding)    a. 11/7617 s/p R colic artery embolization;  b. 02/2015 EGD: duod ulcerative mass->Bx notable for coagulative necrosis - ? ischemia vs thrombosis-->coumadin d/c'd.  . Gout   . Hearing loss   . Hemorrhoids   . Hyperlipidemia   . Osteoarthrosis, unspecified whether generalized or localized, lower leg   . Persistent atrial fibrillation ([place])    a. CHA2DS2VASc = 3-->coumadin d/c'd 02/2015 2/2 recurrent GIB.  [name] Prostatitis   . Pulmonary hypertension    a. 10/2014 Echo: EF 60-65%, mild to mod MR, mildly dil LA, nl RV, PASP 61mHg.  .Marland KitchenRenal transplant recipient   . Ulcers of both great toes ([facility]     Past Surgical History:  Procedure Laterality Date  . BACK SURGERY    . ESOPHAGOGASTRODUODENOSCOPY  03/13/15   severe esophagitis, ulcerated mass  . HERNIA REPAIR  [place]    . STOMACH SURGERY     blood vessel burst  . THROAT SURGERY    . TOTAL KNEE ARTHROPLASTY      Family History  Problem Relation Age of Onset  . Cancer Mother     throat  . Diabetes Brother   . Heart disease Brother   . Stroke Brother   . Hypertension Brother   . Diabetes Sister   . Heart disease Sister   . Hypertension Sister   . Diabetes Sister   . Diabetes Brother   .  COPD Neg Hx   . Kidney disease Neg Hx   . Prostate cancer Neg Hx     Social History:  reports that he quit smoking about 37 years ago. His smoking use included Cigarettes. He has a 25.00 pack-year smoking history. He has never used smokeless tobacco. He reports that he does not drink alcohol or use drugs.  He lives in [place].  The patient is accompanied by his wife, [name],  today.  Allergies: No Known Allergies  Medications Prior to Admission  Medication Sig Dispense Refill  . albuterol (PROAIR HFA) 108 (90 BASE) MCG/ACT inhaler Inhale into the lungs.    [name] allopurinol (ZYLOPRIM) 100 MG tablet Take 100 mg by mouth daily.      . finasteride (PROSCAR) 5 MG tablet Take 5 mg by mouth daily.      . furosemide (LASIX) 80 MG tablet TAKE 1 TABLET (80 MG TOTAL) BY MOUTH TWO (2) TIMES A DAY.  2  . hydroxypropyl methylcellulose (ISOPTO TEARS) 2.5 % ophthalmic solution Place 1 drop into both eyes as needed.     . magnesium oxide (MAG-OX) 400 (241.3 Mg) MG tablet TAKE 1 TABLET (400 MG TOTAL) BY MOUTH TWO (2) TIMES A DAY.  11  . meclizine (ANTIVERT) 25 MG tablet Take 1 tablet (25 mg total) by mouth 3 (three) times daily as needed for dizziness. 30 tablet 0  . metoprolol tartrate (LOPRESSOR) 25 MG tablet TAKE 1 TABLET (25 MG TOTAL) BY MOUTH TWO (2) TIMES A DAY.  11  . Multiple Vitamin (MULTIVITAMIN) tablet Take 1 tablet by mouth daily.      . mycophenolate (MYFORTIC) 180 MG EC tablet Take 2 tablets twice a day, V42.0 kidney transplant, tx date 11/05/04, DAW1, brand medically necessary    . omeprazole (PRILOSEC) 20 MG capsule Take 20 mg by mouth 2 (two) times daily before a meal.     . oxybutynin (DITROPAN-XL) 5 MG 24 hr tablet Take 5 mg by mouth daily.    [name] oxyCODONE-acetaminophen (ROXICET) 5-325 MG tablet Take 0.5-1 tablets by mouth every 6 (six) hours as needed for severe pain. 30 tablet 0  . predniSONE (DELTASONE) 5 MG tablet Take 5 mg by mouth daily with breakfast.     . sucralfate (CARAFATE) 1 G  tablet Take 1 g by mouth 4 (four) times daily.  3  . tacrolimus (PROGRAF) 1 MG capsule Take 1 mg by mouth 3 (three) times daily. Reported on 08/16/2015    . Tamsulosin HCl (FLOMAX) 0.4 MG CAPS Take 0.4 mg by mouth daily.      [name] lactulose (CHRONULAC) 10 GM/15ML solution Take 7.5-15 mLs (5-10 g total) by mouth daily as needed for mild constipation. (Patient not taking: Reported on 04/28/2016) 473 mL 1    Review of Systems: GENERAL:  Feels "ok".  Off and on fevers and sweats.  Weight loss of 30 pounds. PERFORMANCE STATUS (ECOG):  1  HEENT:  No visual changes, sore throat, mouth sores or tenderness. Lungs: No shortness of breath or cough.  No hemoptysis. Cardiac:  No chest pain, palpitations, orthopnea, or PND. GI:  Poor appetite secondary to pain.  Sometimes diarrhea.  No nausea, vomiting, constipation, melena or hematochezia. GU:  Enlarged prostate.  No urgency, frequency, dysuria, or hematuria. Musculoskeletal:  Back pain.  Left hip pain.  No joint pain.  No muscle tenderness. Extremities:  No pain or swelling. Skin:  No rashes or skin changes. Neuro:  Chronic headaches.  No numbness or weakness, balance or coordination issues. Endocrine:  No diabetes, thyroid issues, hot flashes or night sweats. Psych:  No mood changes, depression or anxiety. Pain:  Back pain. Review of systems:  All other systems reviewed and found to be negative  Physical Exam:  Blood pressure (!) 123/46, pulse (!) 56, temperature 98.3 F (36.8 C), resp. rate 18, height 5' 8"  (1.727 m), weight 118 lb 4.8 oz (53.7 kg), SpO2 92 %.  GENERAL:  Elderly thin gentleman sitting comfortably on the medical unit in no acute distress. MENTAL STATUS:  Alert and oriented to person, place and time. HEAD:  Alopecia.  [place].  Normocephalic, atraumatic, face symmetric, no Cushingoid features. EYES:  Glasses.  Brown eyes.  Pupils equal round and reactive to light and accomodation.  No conjunctivitis or scleral icterus. ENT:   Oropharynx clear without lesion.  Edentulous.  Tongue normal. Mucous membranes moist.  RESPIRATORY:  Clear to auscultation without rales, wheezes or rhonchi. CARDIOVASCULAR:  Regular rate and rhythm without murmur, rub or gallop. ABDOMEN:  Soft, non-tender, with active bowel sounds, and no hepatosplenomegaly.  No masses. SKIN:  No rashes, ulcers or lesions. EXTREMITIES: No edema, no skin discoloration or tenderness.  No palpable cords. LYMPH NODES: No palpable cervical, supraclavicular, axillary or inguinal adenopathy  NEUROLOGICAL: Unremarkable. PSYCH:  Appropriate.   Results for orders placed or performed during the hospital encounter of 04/28/16 (from the past 48 hour(s))  CBC     Status: Abnormal   Collection Time: 04/28/16  9:52 PM  Result Value Ref Range   WBC 8.7 3.8 - 10.6 K/uL   RBC 4.33 (L) 4.40 - 5.90 MIL/uL   Hemoglobin 10.5 (L) 13.0 - 18.0 g/dL   HCT 31.9 (L) 40.0 - 52.0 %   MCV 73.8 (L) 80.0 - 100.0 fL   MCH 24.3 (L) 26.0 - 34.0 pg   MCHC 33.0 32.0 - 36.0 g/dL   RDW 18.7 (H) 11.5 - 14.5 %   Platelets 229 150 - 440 K/uL  Basic metabolic panel     Status: Abnormal   Collection Time: 04/28/16  9:52 PM  Result Value Ref Range   Sodium 125 (L) 135 - 145 mmol/L   Potassium 4.9 3.5 - 5.1 mmol/L   Chloride 86 (L) 101 - 111 mmol/L   CO2 27 22 - 32 mmol/L   Glucose, Bld 90 65 - 99 mg/dL   BUN 41 (H) 6 - 20 mg/dL   Creatinine, Ser 1.85 (H) 0.61 - 1.24 mg/dL   Calcium 11.4 (H) 8.9 - 10.3 mg/dL   GFR calc non Af Amer 34 (L) >60 mL/min   GFR calc Af Amer 39 (L) >60 mL/min    Comment: (NOTE) The eGFR has been calculated using the CKD EPI equation. This calculation has not been validated in all clinical situations. eGFR's persistently <60 mL/min signify possible Chronic Kidney Disease.    Anion gap 12 5 - 15  Protime-INR  Status: None   Collection Time: 04/28/16  9:52 PM  Result Value Ref Range   Prothrombin Time 15.0 11.4 - 15.2 seconds   INR 5.78   Basic metabolic  panel     Status: Abnormal   Collection Time: 04/29/16  4:13 AM  Result Value Ref Range   Sodium 127 (L) 135 - 145 mmol/L   Potassium 5.0 3.5 - 5.1 mmol/L   Chloride 87 (L) 101 - 111 mmol/L   CO2 27 22 - 32 mmol/L   Glucose, Bld 93 65 - 99 mg/dL   BUN 42 (H) 6 - 20 mg/dL   Creatinine, Ser 1.86 (H) 0.61 - 1.24 mg/dL   Calcium 11.1 (H) 8.9 - 10.3 mg/dL   GFR calc non Af Amer 34 (L) >60 mL/min   GFR calc Af Amer 39 (L) >60 mL/min    Comment: (NOTE) The eGFR has been calculated using the CKD EPI equation. This calculation has not been validated in all clinical situations. eGFR's persistently <60 mL/min signify possible Chronic Kidney Disease.    Anion gap 13 5 - 15  CBC     Status: Abnormal   Collection Time: 04/29/16  4:13 AM  Result Value Ref Range   WBC 8.2 3.8 - 10.6 K/uL   RBC 3.94 (L) 4.40 - 5.90 MIL/uL   Hemoglobin 9.8 (L) 13.0 - 18.0 g/dL   HCT 29.5 (L) 40.0 - 52.0 %   MCV 74.9 (L) 80.0 - 100.0 fL   MCH 24.8 (L) 26.0 - 34.0 pg   MCHC 33.1 32.0 - 36.0 g/dL   RDW 19.1 (H) 11.5 - 14.5 %   Platelets 228 150 - 440 K/uL  Protime-INR     Status: Abnormal   Collection Time: 04/29/16  9:13 PM  Result Value Ref Range   Prothrombin Time 15.3 (H) 11.4 - 15.2 seconds   INR 1.20   APTT     Status: Abnormal   Collection Time: 04/29/16  9:13 PM  Result Value Ref Range   aPTT 40 (H) 24 - 36 seconds    Comment:        IF BASELINE aPTT IS ELEVATED, SUGGEST PATIENT RISK ASSESSMENT BE USED TO DETERMINE APPROPRIATE ANTICOAGULANT THERAPY.    Dg Chest 2 View  Result Date: 04/28/2016 CLINICAL DATA:  Right-sided back pain.  Status post thoracentesis. EXAM: CHEST  2 VIEW COMPARISON:  Chest radiograph 04/26/2016 FINDINGS: There is a small right apical pneumothorax. Measures approximately 9 mm. Cardiomediastinal contours are unchanged. There are diffuse interstitial pulmonary markings. No overt pulmonary edema. No focal airspace consolidation. No pleural effusion. IMPRESSION: Small superior  right pneumothorax. Electronically Signed   By: Ulyses Jarred M.D.   On: 04/28/2016 22:05   Nm Pet Image Initial (pi) Skull Base To Thigh  Addendum Date: 04/28/2016   ADDENDUM REPORT: 04/28/2016 17:48 ADDENDUM: Critical Value/emergent results were called by telephone at the time of interpretation on 04/28/2016 at 5:48 pm to Dr. Nolon Stalls , who verbally acknowledged these results. Electronically Signed   By: Suzy Bouchard M.D.   On: 04/28/2016 17:48   Result Date: 04/28/2016 CLINICAL DATA:  Initial Treatment strategy for abdominal lymphadenopathy. EXAM: NUCLEAR MEDICINE PET SKULL BASE TO THIGH TECHNIQUE: 12.5 mCi F-18 FDG was injected intravenously. Full-ring PET imaging was performed from the skull base to thigh after the radiotracer. CT data was obtained and used for attenuation correction and anatomic localization. FASTING BLOOD GLUCOSE:  Value: 106 mg/dl COMPARISON:  None. FINDINGS: NECK No hypermetabolic lymph nodes in  the neck. CHEST Hypermetabolic small RIGHT supraclavicular lymph node with SUV max of 5.3. Small hypermetabolic RIGHT lower paratracheal lymph node. No hypermetabolic pulmonary nodules. There is a moderate size pneumothorax on the RIGHT occupying approximately 20% of lung volume. This pneumothorax is anterior in this supine patient. No suspicious nodularity. There is airspace disease in the LEFT suggesting pneumonia or aspiration pneumonitis. There is without metabolic activity associated this airspace disease. ABDOMEN/PELVIS There are multiple discrete hypermetabolic liver metastasis. Multiple RIGHT hepatic lobe lesions. Example lesion with SUV max equals 7.9. There is bulky hypermetabolic adenopathy in the gastrohepatic ligament with SUV max equal 10.1. Individual lymph nodes measure up to 2.5 cm short axis. There is bulky periaortic lymph node. Necrotic lymph node LEFT aorta at the level of the atrophic LEFT kidney measures 5.0 cm with SUV max equal 10.7. Lymph noted in the aorta  caval position measuring 3.2 cm with SUV max 11.4. Adenopathy extends to the iliac vessels. There is bulky mesenteric adenopathy in the lower abdomen with SUV max equal 9.5 inches (image sequencing 5, series 4. There is a loop of bowel with abnormal hypermetabolic activity in the suprapubic location with SUV max equal 8.8. This concerning for involvement of the small bowel with this metastatic process. Transplant kidney in the RIGHT iliac wing. Probable small splenic metastasis. SKELETON There is a focus of hypermetabolic activity in the LEFT sacrum with SUV max equal 12.1. There is a lytic lesion with soft tissue expansion measuring 3.1 cm at this level (26, series 4). This lesion would be amenable to biopsy. Additional metastatic lesions in the sacrum on the RIGHT and within the RIGHT femur neck. Additional hypermetabolic lesions within the lower thoracic spine and cervical spine are. IMPRESSION: 1. Widespread metastatic pattern. Bulky intensely hypermetabolic periaortic upper abdominal and mesenteric adenopathy is concerning for but not specific for high-grade lymphoma. 2. Hypermetabolic liver metastasis. 3. Hypermetabolic lesion involving the small bowel of the upper pelvis. 4. Multiple sites of hypermetabolic skeletal metastasis. Patient may be a risk for pathologic fracture of the RIGHT femoral neck. 5. Potential biopsy site in the LEFT sacrum with soft tissue lesion. 6. Small hypermetabolic RIGHT supraclavicular lymph node would also be amenable to biopsy 7. Moderate volume RIGHT pneumothorax. Potential LEFT lower lobe pneumonia or aspiration pneumonitis. Critical Value/emergent results were called initially attempted at the time of interpretation on 04/28/2016 at 4:56 pm to Dr. Sande Rives, who is covering for Dr. Mike Gip pager. Electronically Signed: By: Suzy Bouchard M.D. On: 04/28/2016 17:04   Dg Chest Port 1 View  Result Date: 04/29/2016 CLINICAL DATA:  Follow-up right-sided pneumothorax. History of  pulmonary hypertension. Patient is undergoing workup for lymphadenopathy, liver and bony lesions. EXAM: PORTABLE CHEST 1 VIEW COMPARISON:  PA and lateral chest x-ray of April 28, 2016 FINDINGS: An approximately 5-10% right apical pneumothorax persists. The lungs are reasonably well inflated. There is no pleural effusion. No alveolar infiltrate or pulmonary mass is observed. The heart is top-normal in size. The pulmonary vascularity is not clearly engorged. There is calcification in the wall of the aortic arch. There is multilevel degenerative disc disease of the thoracic spine. There degenerative changes of the right shoulder. IMPRESSION: Stable 5-10% right apical pneumothorax. Top-normal cardiac size without pulmonary edema. Aortic atherosclerosis. Electronically Signed   By: David  Martinique M.D.   On: 04/29/2016 10:12    Assessment:  The patient is a 75 y.o. gentleman s/p renal transplant in 2007 who finding worrisome for stage IV lymphoma.  He has a 2-3  month history of progressive back pain superimposed on chronic back pain, 30 pound weight loss, and off/on fevers and non-drenching sweats.    MRI [facility] lumbar spine at [facility] on 04/07/2016 revealed multifocal bone lesions within the thoracic spine, lumbar spine, and sacrum.  Differential includes lymphoma, metastatic disease, and multiple myeloma. There was multifocal abdominal lymphadenopathy.  There was a 1.1 cm exophytic lesion at the upper pole of the atrophic native right kidney.   PET scan on 04/28/2016 revealed bulky intensely hypermetabolic periaortic upper abdominal and mesenteric adenopathy concerning for high-grade lymphoma.  There were hypermetabolic liver metastasis.  There was hypermetabolic lesion involving the small bowel of the upper pelvis.  There were multiple sites of hypermetabolic skeletal metastasis. There was small hypermetabolic RIGHT supraclavicular lymph node  PET scan revealed a moderate volume right pneumothorax.  Follow-up CXR revealed a 5-10% right apical pneumothorax.  He has a history of renal failure s/p renal transplant.  He is on mycophenolate.  Creatinine has ranged between 1.43 - 1.93 in the past 6 months.  Symptomatically, he denies any respiratory symptoms.  Plan:   1. Oncology:  I reviewed the images from his recent PET scan with the patient and his wife. Previously images were reviewed with radiology for consideration of biopsy. The supraclavicular node is too small for biopsy and would likely not yield a result adequate for a diagnosis of lymphoma.  Per radiology, the retroperitoneal lymph node seems to be the best site of biopsy. This was discussed with the patient.  He will discuss this with radiology tomorrow. We discussed the plan for biopsy on 04/30/2016.  Order placed.  Patient was in agreement.  We discussed obtaining a plain film of the right femoral neck given the risk for pathologic fracture.  2.  Hematology:  Lovenox for DVT prophylaxis discontinued (patient had declined previously secondary to history of GI bleed in the past).  SCDs ordered.  PT and PTT ordered prior to biopsy.  Patient has a chronic microcytic anemia.  He likely has anemia of chronic disease and possible marrow involvement.  Work-up in the outpatient department revealed a normal ferritin (343), a low TIBC (241), B12, folate, SPEP, and UPEP.  Reticulocyte count was 2%.  3.  Pulmonary:  Pneumothorax resolving.  No intervention needed.  Appreciate Dr. Genevive Bi' consult.  4.  Fluids/electrolytes and nutrition:  Poor oral intake secondary to pain.  Hyponatremia and hypercalcemia.  Check PTH and PTH-rp.  Follow-up with nutrition.  Thank you for allowing me to participate in MALON BRANTON 's care.  I will follow him closely with you while hospitalized and after discharge in the outpatient department.  I will be in [place] on 04/30/2016.   Lequita Asal, MD  04/29/2016

## 2016-04-30 NOTE — Progress Notes (Signed)
Patient is being discharged home this afternoon per MD Bridgett Larsson note.

## 2016-04-30 NOTE — Procedures (Signed)
CT guided left para aortic node bx  Complications:  None  Blood Loss: none  See dictation in canopy pacs

## 2016-04-30 NOTE — Care Management (Addendum)
Informed during progression that there are plans for a chest tube insertion today.  After progression, there was a discharge order by attending. Review of the medical record does not show documentation that chest tube insertion is planned but does appear will have a biopsy 11.8 of retroperitoneal lymph node.  Will investigate to determine if plan of care has changed

## 2016-04-30 NOTE — Discharge Summary (Signed)
[facility] NAME: Matthew Brown    MR#:  [id number]  DATE OF BIRTH:  July 09, 1940  DATE OF ADMISSION:  04/28/2016   ADMITTING PHYSICIAN: Lance Coon, MD  DATE OF DISCHARGE: 04/30/2016 PRIMARY CARE PHYSICIAN: Enid Derry, MD   ADMISSION DIAGNOSIS:  Spontaneous pneumothorax [J93.83] Hyponatremia [E87.1] DISCHARGE DIAGNOSIS:  Principal Problem:   Pneumothorax Active Problems:   Essential hypertension   ATRIAL FIBRILLATION   GERD   Lymphoma ([place])   Hyponatremia   Abnormal positron emission tomography (PET) scan  SECONDARY DIAGNOSIS:   Past Medical History:  Diagnosis Date  . Benign prostatic hypertrophy   . Chronic headache 10/19/2015  . ED (erectile dysfunction)   . End stage renal disease ([place])   . Essential hypertension   . GERD (gastroesophageal reflux disease)   . GIB (gastrointestinal bleeding)    a. [id number] s/p R colic artery embolization;  b. 02/2015 EGD: duod ulcerative mass->Bx notable for coagulative necrosis - ? ischemia vs thrombosis-->coumadin d/c'd.  . Gout   . Hearing loss   . Hemorrhoids   . Hyperlipidemia   . Osteoarthrosis, unspecified whether generalized or localized, lower leg   . Persistent atrial fibrillation ([place])    a. CHA2DS2VASc = 3-->coumadin d/c'd 02/2015 2/2 recurrent GIB.  [name] Prostatitis   . Pulmonary hypertension    a. 10/2014 Echo: EF 60-65%, mild to mod MR, mildly dil LA, nl RV, PASP 92mmHg.  [name] Renal transplant recipient   . Ulcers of both great toes [facility])    HOSPITAL COURSE:  Pneumothorax - small,  chest x-ray shows improvement. Repeat chest x-ray today: stable.  Lymphoma ([place]) - PET scan completed, lymphoma not specifically and diagnosed but suggested based on imaging, CT guided percutaneous biopsy of retroperitoneal lymphadenopathy today per Dr. Michaela Corner.  Hyponatremia, no improvement but stable,  possible chronic, follow-up BMP as outpatient.  CKD stage 3. Stable. Essential  hypertension - continue home meds  chronic ATRIAL FIBRILLATION -rate controlled, continue Lopressor. GERD - home dose PPI  DISCHARGE CONDITIONS:  Stable, discharge to home today after biopsy. CONSULTS OBTAINED:  Treatment Team:  Olean Ree, MD Hubbard Robinson, MD Demetrios Loll, MD DRUG ALLERGIES:  No Known Allergies DISCHARGE MEDICATIONS:     Medication List    STOP taking these medications   furosemide 80 MG tablet Commonly known as:  LASIX     TAKE these medications   allopurinol 100 MG tablet Commonly known as:  ZYLOPRIM Take 100 mg by mouth daily.   finasteride 5 MG tablet Commonly known as:  PROSCAR Take 5 mg by mouth daily.   FLOMAX 0.4 MG Caps capsule Generic drug:  tamsulosin Take 0.4 mg by mouth daily.   hydroxypropyl methylcellulose 2.5 % ophthalmic solution Commonly known as:  ISOPTO TEARS Place 1 drop into both eyes as needed.   lactulose 10 GM/15ML solution Commonly known as:  CHRONULAC Take 7.5-15 mLs (5-10 g total) by mouth daily as needed for mild constipation.   magnesium oxide 400 (241.3 Mg) MG tablet Commonly known as:  MAG-OX TAKE 1 TABLET (400 MG TOTAL) BY MOUTH TWO (2) TIMES A DAY.   meclizine 25 MG tablet Commonly known as:  ANTIVERT Take 1 tablet (25 mg total) by mouth 3 (three) times daily as needed for dizziness.   metoprolol tartrate 25 MG tablet Commonly known as:  LOPRESSOR TAKE 1 TABLET (25 MG TOTAL) BY MOUTH TWO (2) TIMES A DAY.   multivitamin tablet Take 1 tablet by  mouth daily.   MYFORTIC 180 MG EC tablet Generic drug:  mycophenolate Take 2 tablets twice a day, V42.0 kidney transplant, tx date 11/05/04, DAW1, brand medically necessary   omeprazole 20 MG capsule Commonly known as:  PRILOSEC Take 20 mg by mouth 2 (two) times daily before a meal.   oxybutynin 5 MG 24 hr tablet Commonly known as:  DITROPAN-XL Take 5 mg by mouth daily.   oxyCODONE-acetaminophen 5-325 MG tablet Commonly known as:  ROXICET Take  0.5-1 tablets by mouth every 6 (six) hours as needed for severe pain.   predniSONE 5 MG tablet Commonly known as:  DELTASONE Take 5 mg by mouth daily with breakfast.   PROAIR HFA 108 (90 Base) MCG/ACT inhaler Generic drug:  albuterol Inhale into the lungs.   sucralfate 1 g tablet Commonly known as:  CARAFATE Take 1 g by mouth 4 (four) times daily.   tacrolimus 1 MG capsule Commonly known as:  PROGRAF Take 1 mg by mouth 3 (three) times daily. Reported on 08/16/2015        DISCHARGE INSTRUCTIONS:  See AVS.  If you experience worsening of your admission symptoms, develop shortness of breath, life threatening emergency, suicidal or homicidal thoughts you must seek medical attention immediately by calling 911 or calling your MD immediately  if symptoms less severe.  You Must read complete instructions/literature along with all the possible adverse reactions/side effects for all the Medicines you take and that have been prescribed to you. Take any new Medicines after you have completely understood and accpet all the possible adverse reactions/side effects.   Please note  You were cared for by a hospitalist during your hospital stay. If you have any questions about your discharge medications or the care you received while you were in the hospital after you are discharged, you can call the unit and asked to speak with the hospitalist on call if the hospitalist that took care of you is not available. Once you are discharged, your primary care physician will handle any further medical issues. Please note that NO REFILLS for any discharge medications will be authorized once you are discharged, as it is imperative that you return to your primary care physician (or establish a relationship with a primary care physician if you do not have one) for your aftercare needs so that they can reassess your need for medications and monitor your lab values.    On the day of Discharge:  VITAL SIGNS:    Blood pressure (!) 115/45, pulse (!) 55, temperature 97.7 F (36.5 C), temperature source Oral, resp. rate 15, height 5\' 8"  (1.727 m), weight 118 lb 4.8 oz (53.7 kg), SpO2 100 %. PHYSICAL EXAMINATION:  GENERAL:  75 y.o.-year-old patient lying in the bed with no acute distress.  EYES: Pupils equal, round, reactive to light and accommodation. No scleral icterus. Extraocular muscles intact.  HEENT: Head atraumatic, normocephalic. Oropharynx and nasopharynx clear.  NECK:  Supple, no jugular venous distention. No thyroid enlargement, no tenderness.  LUNGS: Normal breath sounds bilaterally, no wheezing, rales,rhonchi or crepitation. No use of accessory muscles of respiration.  CARDIOVASCULAR: S1, S2 normal. No murmurs, rubs, or gallops.  ABDOMEN: Soft, non-tender, non-distended. Bowel sounds present. No organomegaly or mass.  EXTREMITIES: No pedal edema, cyanosis, or clubbing.  NEUROLOGIC: Cranial nerves II through XII are intact. Muscle strength 5/5 in all extremities. Sensation intact. Gait not checked.  PSYCHIATRIC: The patient is alert and oriented x 3.  SKIN: No obvious rash, lesion, or ulcer.  DATA  REVIEW:   CBC  Recent Labs Lab 04/29/16 0413  WBC 8.2  HGB 9.8*  HCT 29.5*  PLT 228    Chemistries   Recent Labs Lab 04/30/16 0425  NA 126*  K 5.1  CL 86*  CO2 30  GLUCOSE 114*  BUN 52*  CREATININE 1.93*  CALCIUM 11.2*     Microbiology Results  Results for orders placed or performed in visit on 10/09/15  Microscopic Examination     Status: Abnormal   Collection Time: 10/09/15  8:49 AM  Result Value Ref Range Status   WBC, UA 6-10 (A) 0 - 5 /hpf Final   RBC, UA 0-2 0 - 2 /hpf Final   Epithelial Cells (non renal) 0-10 0 - 10 /hpf Final   Bacteria, UA None seen None seen/Few Final  CULTURE, URINE COMPREHENSIVE     Status: Abnormal   Collection Time: 10/09/15  8:49 AM  Result Value Ref Range Status   Urine Culture, Comprehensive Final report (A)  Final   Result 1  Comment (A)  Final    Comment: Vancomycin-resistant Enterococcus (Enterococcus faecalis) 400 Colonies/mL    ANTIMICROBIAL SUSCEPTIBILITY Comment  Final    Comment:       ** S = Susceptible; I = Intermediate; R = Resistant **                    P = Positive; N = Negative             MICS are expressed in micrograms per mL    Antibiotic                 RSLT#1    RSLT#2    RSLT#3    RSLT#4 Ciprofloxacin                  R Levofloxacin                   R Linezolid                      S Nitrofurantoin                 S Penicillin                     S Tetracycline                   R Vancomycin                     R   GC/Chlamydia Probe Amp     Status: None   Collection Time: 10/09/15  8:53 AM  Result Value Ref Range Status   Chlamydia trachomatis, NAA Negative Negative Final   Neisseria gonorrhoeae by PCR Negative Negative Final    RADIOLOGY:  Dg Chest Port 1 View  Result Date: 04/30/2016 CLINICAL DATA:  Follow-up right-sided pneumothorax. EXAM: PORTABLE CHEST 1 VIEW COMPARISON:  Portable chest x-ray of April 29, 2016 FINDINGS: There is a stable 5-10% right apical pneumothorax. The right lung remains hypoinflated with apparent elevation of the hemidiaphragm. The left lung is better inflated. The interstitial markings in both lungs are coarse. The cardiac silhouette is enlarged. The central pulmonary vascularity is prominent. There is calcification in the wall of the aortic arch. There are degenerative changes of the right shoulder. IMPRESSION: Stable appearance of the 5-10% right-sided pneumothorax. Stable appearance of the chest otherwise as well peer Electronically Signed  By: David  Martinique M.D.   On: 04/30/2016 08:58   Dg Femur, Min 2 Views Right  Result Date: 04/30/2016 CLINICAL DATA:  Risk for right femur fracture. Abnormal findings on PET-CT. EXAM: RIGHT FEMUR 2 VIEWS COMPARISON:  PET-CT 07/09/2015 FINDINGS: There is subtle lucency in the right femoral neck which is compatible  with the previous PET-CT findings. Joint space narrowing and degenerative changes in the right hip joint. There is a total knee right arthroplasty. Right hip is located without a fracture. Surgical spine hardware noted in the upper pelvis. IMPRESSION: The lucent bone lesion in the right femoral neck is poorly characterized on this examination. No evidence for an acute fracture. Degenerative changes in the right hip joint. Electronically Signed   By: Markus Daft M.D.   On: 04/30/2016 12:23     Management plans discussed with the patient, family and they are in agreement.  CODE STATUS:     Code Status Orders        Start     Ordered   04/29/16 0125  Full code  Continuous     04/29/16 0124    Code Status History    Date Active Date Inactive Code Status Order ID Comments User Context   This patient has a current code status but no historical code status.    Advance Directive Documentation   [place] Most Recent Value  Type of Advance Directive  Living will  Pre-existing out of facility DNR order (yellow form or pink MOST form)  No data  "MOST" Form in Place?  No data      TOTAL TIME TAKING CARE OF THIS PATIENT: 33 minutes.    Demetrios Loll M.D on 04/30/2016 at 3:06 PM  Between 7am to 6pm - Pager - [phone]  After 6pm go to [email or web address] - Proofreader  [facility]  Office  [phone]  CC: Primary care physician; Enid Derry, MD   Note: This dictation was prepared with Dragon dictation along with smaller phrase technology. Any transcriptional errors that result from this process are unintentional.

## 2016-04-30 NOTE — Discharge Instructions (Signed)
Heart healthy diet. Follow up Dr. Mike Gip.

## 2016-04-30 NOTE — Progress Notes (Signed)
Pt returns from s/p biopsy, VSS, no complaints of pain, on left back bandage is clean, dry and intact. I will continue to assess.

## 2016-05-01 LAB — PTH, INTACT AND CALCIUM
Calcium, Total (PTH): 11.2 mg/dL — ABNORMAL HIGH (ref 8.6–10.2)
PTH: 106 pg/mL — ABNORMAL HIGH (ref 15–65)

## 2016-05-02 ENCOUNTER — Encounter: Payer: Self-pay | Admitting: Emergency Medicine

## 2016-05-02 ENCOUNTER — Inpatient Hospital Stay: Payer: Commercial Managed Care - HMO | Attending: Hematology and Oncology | Admitting: Hematology and Oncology

## 2016-05-02 ENCOUNTER — Emergency Department
Admission: EM | Admit: 2016-05-02 | Discharge: 2016-05-02 | Disposition: A | Discharge status: Home / Self Care | Payer: Commercial Managed Care - HMO | Attending: Emergency Medicine | Admitting: Emergency Medicine

## 2016-05-02 ENCOUNTER — Encounter: Payer: Self-pay | Admitting: Hematology and Oncology

## 2016-05-02 ENCOUNTER — Emergency Department: Payer: Commercial Managed Care - HMO

## 2016-05-02 VITALS — BP 123/77 | HR 75 | Temp 95.9°F | Resp 18 | Wt 126.6 lb

## 2016-05-02 DIAGNOSIS — R918 Other nonspecific abnormal finding of lung field: Secondary | ICD-10-CM | POA: Insufficient documentation

## 2016-05-02 DIAGNOSIS — M109 Gout, unspecified: Secondary | ICD-10-CM | POA: Insufficient documentation

## 2016-05-02 DIAGNOSIS — C8330 Diffuse large B-cell lymphoma, unspecified site: Secondary | ICD-10-CM | POA: Diagnosis not present

## 2016-05-02 DIAGNOSIS — Z940 Kidney transplant status: Secondary | ICD-10-CM | POA: Insufficient documentation

## 2016-05-02 DIAGNOSIS — E210 Primary hyperparathyroidism: Secondary | ICD-10-CM | POA: Insufficient documentation

## 2016-05-02 DIAGNOSIS — I4891 Unspecified atrial fibrillation: Secondary | ICD-10-CM | POA: Diagnosis not present

## 2016-05-02 DIAGNOSIS — M899 Disorder of bone, unspecified: Secondary | ICD-10-CM | POA: Insufficient documentation

## 2016-05-02 DIAGNOSIS — I272 Pulmonary hypertension, unspecified: Secondary | ICD-10-CM | POA: Diagnosis not present

## 2016-05-02 DIAGNOSIS — E785 Hyperlipidemia, unspecified: Secondary | ICD-10-CM

## 2016-05-02 DIAGNOSIS — N400 Enlarged prostate without lower urinary tract symptoms: Secondary | ICD-10-CM | POA: Insufficient documentation

## 2016-05-02 DIAGNOSIS — M25551 Pain in right hip: Secondary | ICD-10-CM | POA: Diagnosis present

## 2016-05-02 DIAGNOSIS — R51 Headache: Secondary | ICD-10-CM | POA: Diagnosis not present

## 2016-05-02 DIAGNOSIS — Z79899 Other long term (current) drug therapy: Secondary | ICD-10-CM | POA: Insufficient documentation

## 2016-05-02 DIAGNOSIS — Z87891 Personal history of nicotine dependence: Secondary | ICD-10-CM | POA: Insufficient documentation

## 2016-05-02 DIAGNOSIS — N186 End stage renal disease: Secondary | ICD-10-CM | POA: Diagnosis not present

## 2016-05-02 DIAGNOSIS — G8929 Other chronic pain: Secondary | ICD-10-CM | POA: Insufficient documentation

## 2016-05-02 DIAGNOSIS — K769 Liver disease, unspecified: Secondary | ICD-10-CM | POA: Diagnosis not present

## 2016-05-02 DIAGNOSIS — M25559 Pain in unspecified hip: Secondary | ICD-10-CM

## 2016-05-02 DIAGNOSIS — J90 Pleural effusion, not elsewhere classified: Secondary | ICD-10-CM | POA: Diagnosis not present

## 2016-05-02 DIAGNOSIS — M1611 Unilateral primary osteoarthritis, right hip: Secondary | ICD-10-CM | POA: Insufficient documentation

## 2016-05-02 DIAGNOSIS — I120 Hypertensive chronic kidney disease with stage 5 chronic kidney disease or end stage renal disease: Secondary | ICD-10-CM | POA: Insufficient documentation

## 2016-05-02 DIAGNOSIS — R590 Localized enlarged lymph nodes: Secondary | ICD-10-CM | POA: Insufficient documentation

## 2016-05-02 DIAGNOSIS — R1031 Right lower quadrant pain: Secondary | ICD-10-CM | POA: Diagnosis not present

## 2016-05-02 DIAGNOSIS — N529 Male erectile dysfunction, unspecified: Secondary | ICD-10-CM | POA: Diagnosis not present

## 2016-05-02 DIAGNOSIS — R630 Anorexia: Secondary | ICD-10-CM | POA: Insufficient documentation

## 2016-05-02 DIAGNOSIS — Z7952 Long term (current) use of systemic steroids: Secondary | ICD-10-CM | POA: Insufficient documentation

## 2016-05-02 DIAGNOSIS — M1990 Unspecified osteoarthritis, unspecified site: Secondary | ICD-10-CM | POA: Insufficient documentation

## 2016-05-02 DIAGNOSIS — Z8719 Personal history of other diseases of the digestive system: Secondary | ICD-10-CM | POA: Diagnosis not present

## 2016-05-02 DIAGNOSIS — I129 Hypertensive chronic kidney disease with stage 1 through stage 4 chronic kidney disease, or unspecified chronic kidney disease: Secondary | ICD-10-CM | POA: Diagnosis not present

## 2016-05-02 DIAGNOSIS — K219 Gastro-esophageal reflux disease without esophagitis: Secondary | ICD-10-CM | POA: Diagnosis not present

## 2016-05-02 DIAGNOSIS — Z808 Family history of malignant neoplasm of other organs or systems: Secondary | ICD-10-CM | POA: Insufficient documentation

## 2016-05-02 DIAGNOSIS — R634 Abnormal weight loss: Secondary | ICD-10-CM | POA: Diagnosis not present

## 2016-05-02 DIAGNOSIS — M549 Dorsalgia, unspecified: Secondary | ICD-10-CM | POA: Insufficient documentation

## 2016-05-02 DIAGNOSIS — N289 Disorder of kidney and ureter, unspecified: Secondary | ICD-10-CM | POA: Insufficient documentation

## 2016-05-02 DIAGNOSIS — R7989 Other specified abnormal findings of blood chemistry: Secondary | ICD-10-CM

## 2016-05-02 DIAGNOSIS — Z992 Dependence on renal dialysis: Secondary | ICD-10-CM | POA: Diagnosis not present

## 2016-05-02 DIAGNOSIS — Z7951 Long term (current) use of inhaled steroids: Secondary | ICD-10-CM | POA: Insufficient documentation

## 2016-05-02 DIAGNOSIS — R52 Pain, unspecified: Secondary | ICD-10-CM

## 2016-05-02 DIAGNOSIS — C8593 Non-Hodgkin lymphoma, unspecified, intra-abdominal lymph nodes: Secondary | ICD-10-CM

## 2016-05-02 MED ORDER — HYDROMORPHONE HCL 1 MG/ML IJ SOLN
1.0000 mg | Freq: Once | INTRAMUSCULAR | Status: AC
  Administered 2016-05-02: 1 mg via INTRAMUSCULAR

## 2016-05-02 MED ORDER — HYDROCODONE-ACETAMINOPHEN 5-325 MG PO TABS: 1.0000 | ORAL_TABLET | ORAL | 0 refills | Status: DC | PRN

## 2016-05-02 MED ORDER — HYDROMORPHONE HCL 1 MG/ML IJ SOLN
INTRAMUSCULAR | Status: AC
  Administered 2016-05-02: 1 mg via INTRAMUSCULAR
  Filled 2016-05-02: qty 1

## 2016-05-02 NOTE — ED Provider Notes (Signed)
[facility] Emergency Department Provider Note   ____________________________________________   First MD Initiated Contact with Patient 05/02/16 [phone]     (approximate)  I have reviewed the triage vital signs and the nursing notes.   HISTORY  Chief Complaint Hip Pain    HPI Matthew Brown is a 75 y.o. male is brought in today for pain control. Patient states that his right hip began hurtingand radiates down his right thigh. Patient denies any injury or recent falls. Patient currently is seeing the cancer center and his oncologist recommended that he come to the emergency room for pain control after his oxycodone at 6 AM did not control his pain. Patient has been taking oxycodone 5 mg one half to one tablet every 6 hours as needed. This morning he took one whole tablet. Patient denies any back pain. He denies any urinary symptoms. There is been no fever or chills. Patient states he has "pain all over but his right hip is the worst". Patient is still able to ambulate but this increases his pain. Currently he rates his pain as a 10 over 10. He apparently has an appointment at the cancer center later today.   Past Medical History:  Diagnosis Date  . Benign prostatic hypertrophy   . Chronic headache 10/19/2015  . ED (erectile dysfunction)   . End stage renal disease ([place])   . Essential hypertension   . GERD (gastroesophageal reflux disease)   . GIB (gastrointestinal bleeding)    a. [id number] s/p R colic artery embolization;  b. 02/2015 EGD: duod ulcerative mass->Bx notable for coagulative necrosis - ? ischemia vs thrombosis-->coumadin d/c'd.  . Gout   . Hearing loss   . Hemorrhoids   . Hyperlipidemia   . Osteoarthrosis, unspecified whether generalized or localized, lower leg   . Persistent atrial fibrillation ([place])    a. CHA2DS2VASc = 3-->coumadin d/c'd 02/2015 2/2 recurrent GIB.  [name] Prostatitis   . Pulmonary hypertension    a. 10/2014 Echo: EF 60-65%, mild to mod  MR, mildly dil LA, nl RV, PASP 24mmHg.  [name] Renal transplant recipient   . Ulcers of both great toes [facility])     Patient Active Problem List   Diagnosis Date Noted  . Abnormal positron emission tomography (PET) scan   . Anemia 04/29/2016  . Lymphoma ([place]) 04/28/2016  . Pneumothorax 04/28/2016  . Hyponatremia 04/28/2016  . Adenopathy 04/15/2016  . Bone disease 04/15/2016  . Weight loss 04/15/2016  . Spinal stenosis of cervical region 11/21/2015  . Pleural effusion, right 11/21/2015  . Right groin pain 10/19/2015  . Chronic headache 10/19/2015  . Hip strain 10/09/2015  . BPH with obstruction/lower urinary tract symptoms 09/03/2015  . Prostatitis, chronic 09/03/2015  . Erectile dysfunction of organic origin 09/03/2015  . Difficulty urinating 08/14/2015  . Accumulation of fluid in tissues 07/18/2015  . Persistent atrial fibrillation ([place])   . Pain in finger of right hand 06/15/2015  . Eustachian tube dysfunction, left 06/06/2015  . Chronic insomnia 06/06/2015  . H/O: upper GI bleed 06/06/2015  . Pleural cavity effusion 04/07/2015  . Abdominal pain, generalized 04/04/2015  . Pneumonia due to Haemophilus influenzae ([place]) 03/16/2015  . Encounter for therapeutic drug monitoring 07/27/2013  . Long term (current) use of anticoagulants 09/25/2010  . Hyperlipidemia 10/25/2008  . Essential hypertension 10/25/2008  . ATRIAL FIBRILLATION 10/25/2008  . GERD 10/25/2008  . RENAL FAILURE, END STAGE 10/25/2008  . Osteoarthrosis, unspecified whether generalized or localized, involving lower leg 10/25/2008  .  BENIGN PROSTATIC HYPERTROPHY, HX OF 10/25/2008    Past Surgical History:  Procedure Laterality Date  . BACK SURGERY    . ESOPHAGOGASTRODUODENOSCOPY  03/13/15   severe esophagitis, ulcerated mass  . HERNIA REPAIR  1974  . PROSTATE ABLATION    . STOMACH SURGERY     blood vessel burst  . THROAT SURGERY    . TOTAL KNEE ARTHROPLASTY      Prior to Admission medications   Medication Sig  Start Date End Date Taking? Authorizing Provider  albuterol (PROAIR HFA) 108 (90 BASE) MCG/ACT inhaler Inhale into the lungs. 02/20/14   Historical Provider, MD  allopurinol (ZYLOPRIM) 100 MG tablet Take 100 mg by mouth daily.      Historical Provider, MD  finasteride (PROSCAR) 5 MG tablet Take 5 mg by mouth daily.      Historical Provider, MD  hydroxypropyl methylcellulose (ISOPTO TEARS) 2.5 % ophthalmic solution Place 1 drop into both eyes as needed.     Historical Provider, MD  lactulose (CHRONULAC) 10 GM/15ML solution Take 7.5-15 mLs (5-10 g total) by mouth daily as needed for mild constipation. Patient not taking: Reported on 04/28/2016 11/14/15   Arnetha Courser, MD  magnesium oxide (MAG-OX) 400 (241.3 Mg) MG tablet TAKE 1 TABLET (400 MG TOTAL) BY MOUTH TWO (2) TIMES A DAY. 07/22/15   Historical Provider, MD  meclizine (ANTIVERT) 25 MG tablet Take 1 tablet (25 mg total) by mouth 3 (three) times daily as needed for dizziness. 03/25/16   Arnetha Courser, MD  metoprolol tartrate (LOPRESSOR) 25 MG tablet TAKE 1 TABLET (25 MG TOTAL) BY MOUTH TWO (2) TIMES A DAY. 08/22/15   Historical Provider, MD  Multiple Vitamin (MULTIVITAMIN) tablet Take 1 tablet by mouth daily.      Historical Provider, MD  mycophenolate (MYFORTIC) 180 MG EC tablet Take 2 tablets twice a day, V42.0 kidney transplant, tx date 11/05/04, DAW1, brand medically necessary 10/13/14   Historical Provider, MD  omeprazole (PRILOSEC) 20 MG capsule Take 20 mg by mouth 2 (two) times daily before a meal.  12/19/14   Historical Provider, MD  oxybutynin (DITROPAN-XL) 5 MG 24 hr tablet Take 5 mg by mouth daily.    Historical Provider, MD  oxyCODONE-acetaminophen (ROXICET) 5-325 MG tablet Take 0.5-1 tablets by mouth every 6 (six) hours as needed for severe pain. 01/03/16   Arnetha Courser, MD  predniSONE (DELTASONE) 5 MG tablet Take 5 mg by mouth daily with breakfast.  11/23/13   Historical Provider, MD  sucralfate (CARAFATE) 1 G tablet Take 1 g by mouth 4  (four) times daily. 04/18/15   Historical Provider, MD  tacrolimus (PROGRAF) 1 MG capsule Take 1 mg by mouth 3 (three) times daily. Reported on 08/16/2015    Historical Provider, MD  Tamsulosin HCl (FLOMAX) 0.4 MG CAPS Take 0.4 mg by mouth daily.      Historical Provider, MD    Allergies Patient has no known allergies.  Family History  Problem Relation Age of Onset  . Cancer Mother     throat  . Diabetes Brother   . Heart disease Brother   . Stroke Brother   . Hypertension Brother   . Diabetes Sister   . Heart disease Sister   . Hypertension Sister   . Diabetes Sister   . Diabetes Brother   . COPD Neg Hx   . Kidney disease Neg Hx   . Prostate cancer Neg Hx     Social History Social History  Substance Use Topics  .  Smoking status: Former Smoker    Packs/day: 1.00    Years: 25.00    Types: Cigarettes    Quit date: 06/23/1978  . Smokeless tobacco: Never Used  . Alcohol use No    Review of Systems Constitutional: No fever/chills  Patient is hard of hearing Cardiovascular: Denies chest pain. History of atrial fib Respiratory: Denies shortness of breath. Gastrointestinal: No abdominal pain.  No nausea, no vomiting.  No diarrhea.  No constipation. Genitourinary: Negative for dysuria. History of end-stage renal failure Musculoskeletal: Positive for right hip pain. Skin: Negative for rash. Neurological: Negative for headaches, focal weakness or numbness.  10-point ROS otherwise negative.  ____________________________________________   PHYSICAL EXAM:  VITAL SIGNS: ED Triage Vitals  Enc Vitals Group     BP 05/02/16 0841 (!) 141/59     Pulse Rate 05/02/16 0841 80     Resp 05/02/16 0841 18     Temp 05/02/16 0841 97.4 F (36.3 C)     Temp Source 05/02/16 0841 Oral     SpO2 05/02/16 0841 97 %     Weight 05/02/16 0841 118 lb (53.5 kg)     Height 05/02/16 0841 5\' 8"  (1.727 m)     Head Circumference --      Peak Flow --      Pain Score 05/02/16 0842 10     Pain Loc --       Pain Edu? --      Excl. in [place]? --     Constitutional: Alert and oriented. Well appearing and in no acute distress. Eyes: Conjunctivae are normal. PERRL. EOMI. Head: Atraumatic. Nose: No congestion/rhinnorhea. Neck: No stridor.   Hematological/Lymphatic/Immunilogical: No cervical lymphadenopathy. Cardiovascular: Irregularly rhythm.  Rate of 80.   Good peripheral circulation. Respiratory: Normal respiratory effort.  No retractions. Lungs CTAB. Gastrointestinal: Soft and nontender. No distention. Bowel sounds normoactive 4 quadrants. Musculoskeletal: There is moderate tenderness on palpation of the right posterior hip which increases with movement including abduction. No crepitus was noted. No gross deformities noted. No soft tissue edema. No warmth or erythema present. No tenderness on compression of the pelvic rim. No external rotation or shortening.  Motor function intact. Patient is able to ambulate with assistance. Neurologic:  Normal speech and language. No gross focal neurologic deficits are appreciated. No gait instability. Skin:  Skin is warm, dry and intact. No ecchymosis or abrasions. Psychiatric: Mood and affect are normal. Speech and behavior are normal.  ____________________________________________   LABS (all labs ordered are listed, but only abnormal results are displayed)  Labs Reviewed - No data to display  RADIOLOGY  Right hip x-ray with pelvis per radiologist: IMPRESSION:  No definite acute pathologic fracture or other fracture is observed.  There are no metastatic deposits within the right femoral head and  neck. There is severe right hip joint space loss consistent with  osteoarthritis.   I, Johnn Hai, personally viewed and evaluated these images (plain radiographs) as part of my medical decision making, as well as reviewing the written report by the radiologist.   ____________________________________________   PROCEDURES  Procedure(s)  performed: None  Procedures  Critical Care performed: No  ____________________________________________   INITIAL IMPRESSION / ASSESSMENT AND PLAN / ED COURSE  Pertinent labs & imaging results that were available during my care of the patient were reviewed by me and considered in my medical decision making (see chart for details).    Clinical Course    Patient was given Dilaudid 1 mg IM while in  the emergency room. This was discussed with Dr. Archie Balboa who agrees with the plan. Patient will continue taking oxycodone at home 1 tablet every 4-6 hours as needed for pain. He is to follow-up with cancer Center for continued pain management and keep his appointment today..  ____________________________________________   FINAL CLINICAL IMPRESSION(S) / ED DIAGNOSES  Final diagnoses:  Acute right hip pain  Osteoarthritis of right hip, unspecified osteoarthritis type  Pain management      NEW MEDICATIONS STARTED DURING THIS VISIT:  Discharge Medication List as of 05/02/2016 10:13 AM       Note:  This document was prepared using Dragon voice recognition software and may include unintentional dictation errors.    Johnn Hai, PA-C 05/02/16 Olivet Summers, PA-C 05/02/16 1244    Nance Pear, MD 05/02/16 [phone]

## 2016-05-02 NOTE — Discharge Instructions (Signed)
Keep appointment with Cancer Clinic Continue  pain medication as directed by your doctor Take stool softener if needed while taking pain medication.

## 2016-05-02 NOTE — Progress Notes (Signed)
Patient acute add on today for pain.

## 2016-05-02 NOTE — ED Triage Notes (Signed)
Patient arrives to [facility] ED from home via POV with c/o right hip pain with radiation down leg. Patient was instructed to come to the ED by Mike Gip MD (Oncology) for pain control per family. Patient has been ambulatory with pain

## 2016-05-02 NOTE — ED Notes (Signed)
Pt to ed with c/o right hip pain that radiates down right leg.  Pt states he has pain all over but right leg and hip is the worst.  Pt reports he can ambulate with cane, however severe pain.  Pt currently under oncologist care.  Per pt wife. Pt last dose oxycodone was at 6 am, 1 tab without pain relief.

## 2016-05-02 NOTE — Progress Notes (Signed)
[facility] day:  05/02/2016   Chief Complaint: Matthew Brown is a 75 y.o. male s/p renal transplant with multiple abnormalities on scan who is seen for reassessment after interval hospitalization.  HPI:  The patient was last seen in the medical oncology clinic on 04/15/2016 for initial consultation.  At that time, he noted a 2-3 month history of progressive back pain superimposed on chronic back pain.  MRI of the lumbar spine at [facility] on 04/07/2016 revealed multifocal bone lesions within the thoracic spine, lumbar spine, and sacrum.  Differential includes lymphoma, metastatic disease, and multiple myeloma. There was multifocal abdominal lymphadenopathy.  He underwent laboratory evaluation.  PTH was 106 (high) 04/30/2016.  Calcium was 11.2.  Etiology was c/w primary hyperparathyroidism.  PTH-related polypeptide on 04/30/2016 is pending.  PET scan on 04/28/2016 revealed bulky intensely hypermetabolic periaortic upper abdominal and mesenteric adenopathy concerning for high-grade lymphoma.  There was hypermetabolic liver metastasis.  There was hpermetabolic lesion involving the small bowel of the upper pelvis.  There were multiple sites of hypermetabolic skeletal metastasis. There was moderate volume RIGHT pneumothorax.  Potential LEFT lower lobe pneumonia or aspiration pneumonitis.  He was contacted regarding the pneumothorax that evening and admitted to the hospital.  He was admitted at [facility] from 04/28/2016 - 04/30/2016.  He was seen by thoracic surgery.  His pneumothorax improved spontaneously.    He underwent CT guided biopsy of a retroperitoneal node on 04/30/2016.  Pathology is pending.  Plain films of the right femur revealed the lucent bone lesion in the right femoral neck  was poorly characterized.  There was no evidence for an acute fracture.   He contacted the clinic early this morning.  He was in excruciating pain.  He was directed to the  ER.  Plain films of the pelvis revealed no evidence of fracture.  There was severe right hip joint space loss consistent with osteoarthritis.  He received Dilaudid 1 mg.  He was previously taking oxycodone 5 mg every 4-6 hours prn pain.  He took 3-4/day.  He states that the pills made him sleepy.  He is currently drowsy after leaving the ER.   Past Medical History:  Diagnosis Date  . Benign prostatic hypertrophy   . Chronic headache 10/19/2015  . ED (erectile dysfunction)   . End stage renal disease ([place])   . Essential hypertension   . GERD (gastroesophageal reflux disease)   . GIB (gastrointestinal bleeding)    a. 01/4165 s/p R colic artery embolization;  b. 02/2015 EGD: duod ulcerative mass->Bx notable for coagulative necrosis - ? ischemia vs thrombosis-->coumadin d/c'd.  . Gout   . Hearing loss   . Hemorrhoids   . Hyperlipidemia   . Osteoarthrosis, unspecified whether generalized or localized, lower leg   . Persistent atrial fibrillation ([place])    a. CHA2DS2VASc = 3-->coumadin d/c'd 02/2015 2/2 recurrent GIB.  [name] Prostatitis   . Pulmonary hypertension    a. 10/2014 Echo: EF 60-65%, mild to mod MR, mildly dil LA, nl RV, PASP 46mHg.  .Marland KitchenRenal transplant recipient   . Ulcers of both great toes ([facility]     Past Surgical History:  Procedure Laterality Date  . BACK SURGERY    . ESOPHAGOGASTRODUODENOSCOPY  03/13/15   severe esophagitis, ulcerated mass  . HERNIA REPAIR  1974  . PROSTATE ABLATION    . STOMACH SURGERY     blood vessel burst  . THROAT SURGERY    . TOTAL KNEE  ARTHROPLASTY      Family History  Problem Relation Age of Onset  . Cancer Mother     throat  . Diabetes Brother   . Heart disease Brother   . Stroke Brother   . Hypertension Brother   . Diabetes Sister   . Heart disease Sister   . Hypertension Sister   . Diabetes Sister   . Diabetes Brother   . COPD Neg Hx   . Kidney disease Neg Hx   . Prostate cancer Neg Hx     Social History:  reports that he quit  smoking about 37 years ago. His smoking use included Cigarettes. He has a 25.00 pack-year smoking history. He has never used smokeless tobacco. He reports that he does not drink alcohol or use drugs.  He stopped smoking in 1981.  He smoked 3 cigarettes/day.  He lives in [place].  The patient is accompanied by his wife, [name],  today.  Allergies: No Known Allergies  Current Medications: Current Outpatient Prescriptions  Medication Sig Dispense Refill  . albuterol (PROAIR HFA) 108 (90 BASE) MCG/ACT inhaler Inhale into the lungs.    [name] allopurinol (ZYLOPRIM) 100 MG tablet Take 100 mg by mouth daily.      . finasteride (PROSCAR) 5 MG tablet Take 5 mg by mouth daily.      . furosemide (LASIX) 80 MG tablet     . hydroxypropyl methylcellulose (ISOPTO TEARS) 2.5 % ophthalmic solution Place 1 drop into both eyes as needed.     . lactulose (CHRONULAC) 10 GM/15ML solution Take 7.5-15 mLs (5-10 g total) by mouth daily as needed for mild constipation. 473 mL 1  . magnesium oxide (MAG-OX) 400 (241.3 Mg) MG tablet TAKE 1 TABLET (400 MG TOTAL) BY MOUTH TWO (2) TIMES A DAY.  11  . meclizine (ANTIVERT) 25 MG tablet Take 1 tablet (25 mg total) by mouth 3 (three) times daily as needed for dizziness. 30 tablet 0  . metoprolol tartrate (LOPRESSOR) 25 MG tablet TAKE 1 TABLET (25 MG TOTAL) BY MOUTH TWO (2) TIMES A DAY.  11  . Multiple Vitamin (MULTIVITAMIN) tablet Take 1 tablet by mouth daily.      . mycophenolate (MYFORTIC) 180 MG EC tablet Take 2 tablets twice a day, V42.0 kidney transplant, tx date 11/05/04, DAW1, brand medically necessary    . omeprazole (PRILOSEC) 20 MG capsule Take 20 mg by mouth 2 (two) times daily before a meal.     . oxybutynin (DITROPAN-XL) 5 MG 24 hr tablet Take 5 mg by mouth daily.    [name] oxyCODONE-acetaminophen (ROXICET) 5-325 MG tablet Take 0.5-1 tablets by mouth every 6 (six) hours as needed for severe pain. 30 tablet 0  . predniSONE (DELTASONE) 5 MG tablet Take 5 mg by mouth daily with  breakfast.     . sucralfate (CARAFATE) 1 G tablet Take 1 g by mouth 4 (four) times daily.  3  . tacrolimus (PROGRAF) 1 MG capsule Take 1 mg by mouth 3 (three) times daily. Reported on 08/16/2015    . Tamsulosin HCl (FLOMAX) 0.4 MG CAPS Take 0.4 mg by mouth daily.       No current facility-administered medications for this visit.     Review of Systems:  GENERAL:  Feels tired.  Off and on fevers and sweats.  Weight loss of 30 pounds. PERFORMANCE STATUS (ECOG):  1 HEENT:  No visual changes, sore throat, mouth sores or tenderness. Lungs: No shortness of breath or cough.  No hemoptysis. Cardiac:  No chest pain, palpitations, orthopnea, or PND. GI:  Poor appetite.  No nausea, vomiting, diarrhea, constipation, melena or hematochezia. GU:  Enlarged prostate.  No urgency, frequency, dysuria, or hematuria. Musculoskeletal:  Back pain and left hip pain (see HPI).  No joint pain.  No muscle tenderness. Extremities:  No pain or swelling. Skin:  No rashes or skin changes. Neuro:  Chronic headaches.  No numbness or weakness, balance or coordination issues. Endocrine:  No diabetes, thyroid issues, hot flashes or night sweats. Psych:  No mood changes, depression or anxiety. Pain:  Back pain (seen in ER today). Review of systems:  All other systems reviewed and found to be negative.  Physical Exam: Blood pressure 123/77, pulse 75, temperature (!) 95.9 F (35.5 C), temperature source Tympanic, resp. rate 18, weight 126 lb 9 oz (57.4 kg). GENERAL:  Thin elderly gentleman sitting comfortably ina wheelchair in the exam room intermittently nodding off MENTAL STATUS:  Alert and oriented to person, place and time. HEAD:  Lu [place].  Normocephalic, atraumatic, face symmetric, no Cushingoid features. EYES:  Glasses.  Brown eyes.  Pupils equal round and reactive to light and accomodation.  No conjunctivitis or scleral icterus. ENT:  Oropharynx clear without lesion.  Edentulous.  Tongue normal. Mucous membranes  dry.  RESPIRATORY:  Clear to auscultation without rales, wheezes or rhonchi. CARDIOVASCULAR:  Regular rate and rhythm without murmur, rub or gallop. ABDOMEN:  Soft, non-tender, with active bowel sounds, and no hepatosplenomegaly.  No masses.   SKIN:  No rashes, ulcers or lesions. EXTREMITIES:  No edema, no skin discoloration or tenderness.  No palpable cords. NEUROLOGICAL: Unremarkable. PSYCH:  Sleepy s/p Demerol.   Admission on 04/28/2016, Discharged on 04/30/2016  Component Date Value Ref Range Status  . WBC 04/28/2016 8.7  3.8 - 10.6 K/uL Final  . RBC 04/28/2016 4.33* 4.40 - 5.90 MIL/uL Final  . Hemoglobin 04/28/2016 10.5* 13.0 - 18.0 g/dL Final  . HCT 04/28/2016 31.9* 40.0 - 52.0 % Final  . MCV 04/28/2016 73.8* 80.0 - 100.0 fL Final  . MCH 04/28/2016 24.3* 26.0 - 34.0 pg Final  . MCHC 04/28/2016 33.0  32.0 - 36.0 g/dL Final  . RDW 04/28/2016 18.7* 11.5 - 14.5 % Final  . Platelets 04/28/2016 229  150 - 440 K/uL Final  . Sodium 04/28/2016 125* 135 - 145 mmol/L Final  . Potassium 04/28/2016 4.9  3.5 - 5.1 mmol/L Final  . Chloride 04/28/2016 86* 101 - 111 mmol/L Final  . CO2 04/28/2016 27  22 - 32 mmol/L Final  . Glucose, Bld 04/28/2016 90  65 - 99 mg/dL Final  . BUN 04/28/2016 41* 6 - 20 mg/dL Final  . Creatinine, Ser 04/28/2016 1.85* 0.61 - 1.24 mg/dL Final  . Calcium 04/28/2016 11.4* 8.9 - 10.3 mg/dL Final  . GFR calc non Af Amer 04/28/2016 34* >60 mL/min Final  . GFR calc Af Amer 04/28/2016 39* >60 mL/min Final   Comment: (NOTE) The eGFR has been calculated using the CKD EPI equation. This calculation has not been validated in all clinical situations. eGFR's persistently <60 mL/min signify possible Chronic Kidney Disease.   . Anion gap 04/28/2016 12  5 - 15 Final  . Prothrombin Time 04/28/2016 15.0  11.4 - 15.2 seconds Final  . INR 04/28/2016 1.17   Final  . Sodium 04/29/2016 127* 135 - 145 mmol/L Final  . Potassium 04/29/2016 5.0  3.5 - 5.1 mmol/L Final  . Chloride  04/29/2016 87* 101 - 111 mmol/L Final  . CO2 04/29/2016  27  22 - 32 mmol/L Final  . Glucose, Bld 04/29/2016 93  65 - 99 mg/dL Final  . BUN 04/29/2016 42* 6 - 20 mg/dL Final  . Creatinine, Ser 04/29/2016 1.86* 0.61 - 1.24 mg/dL Final  . Calcium 04/29/2016 11.1* 8.9 - 10.3 mg/dL Final  . GFR calc non Af Amer 04/29/2016 34* >60 mL/min Final  . GFR calc Af Amer 04/29/2016 39* >60 mL/min Final   Comment: (NOTE) The eGFR has been calculated using the CKD EPI equation. This calculation has not been validated in all clinical situations. eGFR's persistently <60 mL/min signify possible Chronic Kidney Disease.   . Anion gap 04/29/2016 13  5 - 15 Final  . WBC 04/29/2016 8.2  3.8 - 10.6 K/uL Final  . RBC 04/29/2016 3.94* 4.40 - 5.90 MIL/uL Final  . Hemoglobin 04/29/2016 9.8* 13.0 - 18.0 g/dL Final  . HCT 04/29/2016 29.5* 40.0 - 52.0 % Final  . MCV 04/29/2016 74.9* 80.0 - 100.0 fL Final  . MCH 04/29/2016 24.8* 26.0 - 34.0 pg Final  . MCHC 04/29/2016 33.1  32.0 - 36.0 g/dL Final  . RDW 04/29/2016 19.1* 11.5 - 14.5 % Final  . Platelets 04/29/2016 228  150 - 440 K/uL Final  . Sodium 04/30/2016 126* 135 - 145 mmol/L Final  . Potassium 04/30/2016 5.1  3.5 - 5.1 mmol/L Final  . Chloride 04/30/2016 86* 101 - 111 mmol/L Final  . CO2 04/30/2016 30  22 - 32 mmol/L Final  . Glucose, Bld 04/30/2016 114* 65 - 99 mg/dL Final  . BUN 04/30/2016 52* 6 - 20 mg/dL Final  . Creatinine, Ser 04/30/2016 1.93* 0.61 - 1.24 mg/dL Final  . Calcium 04/30/2016 11.2* 8.9 - 10.3 mg/dL Final  . GFR calc non Af Amer 04/30/2016 32* >60 mL/min Final  . GFR calc Af Amer 04/30/2016 37* >60 mL/min Final   Comment: (NOTE) The eGFR has been calculated using the CKD EPI equation. This calculation has not been validated in all clinical situations. eGFR's persistently <60 mL/min signify possible Chronic Kidney Disease.   . Anion gap 04/30/2016 10  5 - 15 Final  . Prothrombin Time 04/29/2016 15.3* 11.4 - 15.2 seconds Final  . INR  04/29/2016 1.20   Final  . aPTT 04/29/2016 40* 24 - 36 seconds Final   Comment:        IF BASELINE aPTT IS ELEVATED, SUGGEST PATIENT RISK ASSESSMENT BE USED TO DETERMINE APPROPRIATE ANTICOAGULANT THERAPY.   [name] PTH 05/01/2016 106* 15 - 65 pg/mL Final  . Calcium, Total (PTH) 05/01/2016 11.2* 8.6 - 10.2 mg/dL Final  . PTH 05/01/2016 Comment   Final   Comment: (NOTE) Interpretation                 Intact PTH    Calcium                                (pg/mL)      (mg/dL) Normal                          15 - 65     8.6 - 10.2 Primary Hyperparathyroidism         >65          >10.2 Secondary Hyperparathyroidism       >65          <10.2 Non-Parathyroid Hypercalcemia       <65          >  10.2 Hypoparathyroidism                  <15          < 8.6 Non-Parathyroid Hypocalcemia    15 - 65          < 8.6 Performed At: [facility] [place], [place] [id number] Lindon Romp MD TJ:[id number]     Assessment:  HARISH BRAM is a 75 y.o. male s/p renal transplant (2007) with probable lymphoma.  He presented with a 2-3 month history of progressive back pain superimposed on chronic back pain.  MRI of the lumbar spine at [facility] on 04/07/2016 revealed multifocal bone lesions within the thoracic spine, lumbar spine, and sacrum.  Differential includes lymphoma, metastatic disease, and multiple myeloma. There was multifocal abdominal lymphadenopathy.  There was a 1.1 cm exophytic lesion at the upper pole of the atrophic native right kidney.   Work-up on 04/15/2016 revealed a hematocrit 28.9, hemoglobin 9.5, MCV 74.6, platelets 224,000, white count 7400 with an [place] of 6500.  SPEP revealed no monoclonal protein.  24 hour UPEP was negative.  Free light chain ratio was normal.  Normal labs included the following: ferritin (343), iron saturation (6%), TIBC (241; low), B12 (525), folate (27).  Reticulocyte count was 2%.  LDH was 409.  Uric acid was 7.7 (4.4 - 7.6).  PET scan on 04/28/2016  revealed bulky intensely hypermetabolic periaortic upper abdominal and mesenteric adenopathy concerning for high-grade lymphoma.  There was hypermetabolic liver metastasis.  There was hpermetabolic lesion involving the small bowel of the upper pelvis.  There were multiple sites of hypermetabolic skeletal metastasis. There was moderate volume right pneumothorax.  He was admitted at [facility] from 04/28/2016 - 04/30/2016.  He was seen by thoracic surgery.  His pneumothorax improved spontaneously.  He underwent CT guided biopsy of a retroperitoneal node on 04/30/2016.  Pathology is pending.  Plain films of the right femur revealed the lucent bone lesion in the right femoral neck  was poorly characterized.  There was no evidence for an acute fracture.  PTH was 106 (high) 04/30/2016 with a calcium of 11.2.  Etiology was c/w primary hyperparathyroidism.  PTH-related polypeptide on 04/30/2016 is pending.  He has a history of renal failure s/p renal transplant.  He is on mycophenolate.  Creatinine has ranged between 1.43 - 1.93 in the past 6 months.  Symptomatically, he has had poorly controlled pain.  Oxycodone makes him sleepy.  Plan: 1.  Discuss pain management.  Oxycodone 5 mg makes patient sleepy.  Discuss trying hydrocodone 5 mg/acetaminophen 325 every 4-6 hours instead of oxycodone.  Encourage patient to keep a pain diary.  Discuss switching to long acting medication once needs determined. 2.  Discuss pending lymph node biopsy.  Suspect lymphoma. 3.  Follow-up endocrinology referral for primary hyperparathyroidism. 4.  Schedule echo in anticipation of probable mini-RCHOP. 5.  Phone follow-up with renal transplant team [facility] [place], RN: [phone]; Clarise Cruz, MD: [phone]; [phone]). 6.  Rx:  hydrocodone 5 mg/acetaminophen 325 every 4-6 hours; dis: # 30. 7.  RTC after biopsy back to discuss treatment plan.   Lequita Asal, MD  05/02/2016, 2:11 PM

## 2016-05-03 DIAGNOSIS — R7989 Other specified abnormal findings of blood chemistry: Secondary | ICD-10-CM | POA: Insufficient documentation

## 2016-05-03 DIAGNOSIS — M25559 Pain in unspecified hip: Secondary | ICD-10-CM | POA: Insufficient documentation

## 2016-05-04 LAB — PTH-RELATED PEPTIDE: PTH-related peptide: <1.1 pmol/L

## 2016-05-05 ENCOUNTER — Telehealth: Payer: Self-pay

## 2016-05-05 ENCOUNTER — Telehealth: Payer: Self-pay | Admitting: *Deleted

## 2016-05-05 ENCOUNTER — Other Ambulatory Visit: Payer: Self-pay | Admitting: Hematology and Oncology

## 2016-05-05 NOTE — Telephone Encounter (Signed)
Called patient's home and spoke to spouse.  Asked if patient can come in tomorrow morning 05-05-16 @ 9:15 to see MD.  She states they can be here.

## 2016-05-05 NOTE — Telephone Encounter (Signed)
Nutrition Assessment:  Patient identified on Malnutrition Screening Report due to poor oral intake and weight loss.   ASSESSMENT:  75 year old male with probable lymphoma.  Patient with multifocal bone lesions (thoracic spine, lumbar spine, sacrum) as well as liver metastasis.  Noted patient in hospital 04/28/2016-04/30/2016 with pneumothorax.  Patient has a history of renal failure s/p renal transplant, HTN, GERD, HLD, afib.  Spoke with patient via phone and reports appetite has improved since last week as pain has improved.  Has been adding yogurt and drinking ensure.    Reports wife prepares meals and always has something for him to eat.  Patient reports appetite for the last several months has been down secondary to extreme pain, unable to sleep at night.   Medications: lasix, lactulose prn, Mag ox, MVI, carafate  Labs: (11/8) Na 126, BUN 52, creatinine 1.93, glucose 114, calcium 11.2  Anthropometrics:   Height: 68 inches Weight: 126 lb 9 oz (05/02/16 in clinic) was 118 lb on 11/8. Patient and wife pleased with increase in weight as well. UBW: 173 lb (in 2014) BMI: 19  25% weight loss since April 2017 per chart review (7 months).  Estimated Energy Needs  Kcals: [id number] kcals/d Protein: 68-85 g/d Fluid: >/= 2 L/d  NUTRITION DIAGNOSIS: Inadequate food and beverage intake related to acute pain as evidenced by eating < or equal to 75% of estimated energy requirement for > or equal to 1 month resulting in 25% weight loss in the last 7 months.   MALNUTRITION DIAGNOSIS: Patient meets criteria for severe malnutrition secondary to eating < or equal to 75% of estimated energy requirement for > or equal to 1 month resulting in 25% weight loss in the last 7 months.   INTERVENTION:   Discussed benefit of small frequent meals, ways to increase calories and protein, and the use of oral nutrition supplements to improve nutrition.  Handouts mailed to patient and wife. Teach back method  used.     MONITORING, EVALUATION, GOAL: Patient will increase calories and protein to promote continued weight gain.    NEXT VISIT: to be determined pending patient status  Tanikka Bresnan B. Zenia Resides, [place], [place] (pager)

## 2016-05-06 ENCOUNTER — Inpatient Hospital Stay (HOSPITAL_BASED_OUTPATIENT_CLINIC_OR_DEPARTMENT_OTHER): Payer: Commercial Managed Care - HMO | Admitting: Hematology and Oncology

## 2016-05-06 ENCOUNTER — Other Ambulatory Visit: Payer: Self-pay | Admitting: Hematology and Oncology

## 2016-05-06 ENCOUNTER — Ambulatory Visit (HOSPITAL_BASED_OUTPATIENT_CLINIC_OR_DEPARTMENT_OTHER)
Admission: RE | Admit: 2016-05-06 | Discharge: 2016-05-06 | Disposition: A | Discharge status: Home / Self Care | Payer: Commercial Managed Care - HMO | Source: Ambulatory Visit | Attending: Hematology and Oncology | Admitting: Hematology and Oncology

## 2016-05-06 ENCOUNTER — Encounter: Payer: Self-pay | Admitting: Hematology and Oncology

## 2016-05-06 ENCOUNTER — Encounter: Payer: Self-pay | Admitting: *Deleted

## 2016-05-06 ENCOUNTER — Inpatient Hospital Stay
Admission: AD | Admit: 2016-05-06 | Discharge: 2016-05-13 | DRG: 673 | Disposition: A | Discharge status: Home / Self Care | Payer: Commercial Managed Care - HMO | Source: Ambulatory Visit | Attending: Internal Medicine | Admitting: Internal Medicine

## 2016-05-06 ENCOUNTER — Inpatient Hospital Stay: Payer: Commercial Managed Care - HMO

## 2016-05-06 ENCOUNTER — Other Ambulatory Visit: Payer: Self-pay | Admitting: *Deleted

## 2016-05-06 VITALS — BP 111/65 | HR 60 | Temp 96.3°F | Resp 18 | Wt 126.2 lb

## 2016-05-06 DIAGNOSIS — D47Z1 Post-transplant lymphoproliferative disorder (PTLD): Secondary | ICD-10-CM

## 2016-05-06 DIAGNOSIS — C8593 Non-Hodgkin lymphoma, unspecified, intra-abdominal lymph nodes: Secondary | ICD-10-CM

## 2016-05-06 DIAGNOSIS — E871 Hypo-osmolality and hyponatremia: Secondary | ICD-10-CM | POA: Diagnosis present

## 2016-05-06 DIAGNOSIS — E883 Tumor lysis syndrome: Secondary | ICD-10-CM | POA: Diagnosis present

## 2016-05-06 DIAGNOSIS — I129 Hypertensive chronic kidney disease with stage 1 through stage 4 chronic kidney disease, or unspecified chronic kidney disease: Secondary | ICD-10-CM

## 2016-05-06 DIAGNOSIS — E785 Hyperlipidemia, unspecified: Secondary | ICD-10-CM

## 2016-05-06 DIAGNOSIS — R918 Other nonspecific abnormal finding of lung field: Secondary | ICD-10-CM

## 2016-05-06 DIAGNOSIS — E210 Primary hyperparathyroidism: Secondary | ICD-10-CM

## 2016-05-06 DIAGNOSIS — Z79891 Long term (current) use of opiate analgesic: Secondary | ICD-10-CM

## 2016-05-06 DIAGNOSIS — M549 Dorsalgia, unspecified: Secondary | ICD-10-CM | POA: Diagnosis present

## 2016-05-06 DIAGNOSIS — N400 Enlarged prostate without lower urinary tract symptoms: Secondary | ICD-10-CM

## 2016-05-06 DIAGNOSIS — C8590 Non-Hodgkin lymphoma, unspecified, unspecified site: Secondary | ICD-10-CM

## 2016-05-06 DIAGNOSIS — Z79899 Other long term (current) drug therapy: Secondary | ICD-10-CM

## 2016-05-06 DIAGNOSIS — I4891 Unspecified atrial fibrillation: Secondary | ICD-10-CM | POA: Diagnosis present

## 2016-05-06 DIAGNOSIS — N179 Acute kidney failure, unspecified: Secondary | ICD-10-CM | POA: Diagnosis present

## 2016-05-06 DIAGNOSIS — N529 Male erectile dysfunction, unspecified: Secondary | ICD-10-CM

## 2016-05-06 DIAGNOSIS — M109 Gout, unspecified: Secondary | ICD-10-CM

## 2016-05-06 DIAGNOSIS — M1990 Unspecified osteoarthritis, unspecified site: Secondary | ICD-10-CM

## 2016-05-06 DIAGNOSIS — K219 Gastro-esophageal reflux disease without esophagitis: Secondary | ICD-10-CM

## 2016-05-06 DIAGNOSIS — C787 Secondary malignant neoplasm of liver and intrahepatic bile duct: Secondary | ICD-10-CM | POA: Diagnosis present

## 2016-05-06 DIAGNOSIS — Z7952 Long term (current) use of systemic steroids: Secondary | ICD-10-CM

## 2016-05-06 DIAGNOSIS — Y830 Surgical operation with transplant of whole organ as the cause of abnormal reaction of the patient, or of later complication, without mention of misadventure at the time of the procedure: Secondary | ICD-10-CM | POA: Diagnosis present

## 2016-05-06 DIAGNOSIS — N186 End stage renal disease: Secondary | ICD-10-CM

## 2016-05-06 DIAGNOSIS — R0602 Shortness of breath: Secondary | ICD-10-CM

## 2016-05-06 DIAGNOSIS — I272 Pulmonary hypertension, unspecified: Secondary | ICD-10-CM

## 2016-05-06 DIAGNOSIS — C8338 Diffuse large B-cell lymphoma, lymph nodes of multiple sites: Secondary | ICD-10-CM

## 2016-05-06 DIAGNOSIS — Z7951 Long term (current) use of inhaled steroids: Secondary | ICD-10-CM

## 2016-05-06 DIAGNOSIS — Z8249 Family history of ischemic heart disease and other diseases of the circulatory system: Secondary | ICD-10-CM

## 2016-05-06 DIAGNOSIS — C7951 Secondary malignant neoplasm of bone: Secondary | ICD-10-CM | POA: Diagnosis present

## 2016-05-06 DIAGNOSIS — T8610 Unspecified complication of kidney transplant: Secondary | ICD-10-CM | POA: Diagnosis not present

## 2016-05-06 DIAGNOSIS — C8330 Diffuse large B-cell lymphoma, unspecified site: Secondary | ICD-10-CM | POA: Diagnosis present

## 2016-05-06 DIAGNOSIS — F17210 Nicotine dependence, cigarettes, uncomplicated: Secondary | ICD-10-CM | POA: Diagnosis present

## 2016-05-06 DIAGNOSIS — C8333 Diffuse large B-cell lymphoma, intra-abdominal lymph nodes: Secondary | ICD-10-CM

## 2016-05-06 DIAGNOSIS — Z87891 Personal history of nicotine dependence: Secondary | ICD-10-CM

## 2016-05-06 DIAGNOSIS — N183 Chronic kidney disease, stage 3 (moderate): Secondary | ICD-10-CM | POA: Diagnosis present

## 2016-05-06 DIAGNOSIS — G8929 Other chronic pain: Secondary | ICD-10-CM | POA: Diagnosis present

## 2016-05-06 DIAGNOSIS — R634 Abnormal weight loss: Secondary | ICD-10-CM

## 2016-05-06 DIAGNOSIS — Z940 Kidney transplant status: Secondary | ICD-10-CM

## 2016-05-06 DIAGNOSIS — R51 Headache: Secondary | ICD-10-CM

## 2016-05-06 DIAGNOSIS — R1031 Right lower quadrant pain: Secondary | ICD-10-CM | POA: Diagnosis not present

## 2016-05-06 DIAGNOSIS — N2581 Secondary hyperparathyroidism of renal origin: Secondary | ICD-10-CM | POA: Diagnosis present

## 2016-05-06 DIAGNOSIS — E875 Hyperkalemia: Secondary | ICD-10-CM | POA: Diagnosis present

## 2016-05-06 DIAGNOSIS — I10 Essential (primary) hypertension: Secondary | ICD-10-CM | POA: Diagnosis not present

## 2016-05-06 DIAGNOSIS — T8612 Kidney transplant failure: Secondary | ICD-10-CM | POA: Diagnosis present

## 2016-05-06 DIAGNOSIS — T8699 Other complications of unspecified transplanted organ and tissue: Secondary | ICD-10-CM

## 2016-05-06 DIAGNOSIS — Z681 Body mass index (BMI) 19 or less, adult: Secondary | ICD-10-CM | POA: Diagnosis not present

## 2016-05-06 DIAGNOSIS — J90 Pleural effusion, not elsewhere classified: Secondary | ICD-10-CM

## 2016-05-06 DIAGNOSIS — E43 Unspecified severe protein-calorie malnutrition: Secondary | ICD-10-CM | POA: Diagnosis present

## 2016-05-06 DIAGNOSIS — Z8719 Personal history of other diseases of the digestive system: Secondary | ICD-10-CM

## 2016-05-06 DIAGNOSIS — D649 Anemia, unspecified: Secondary | ICD-10-CM

## 2016-05-06 DIAGNOSIS — D638 Anemia in other chronic diseases classified elsewhere: Secondary | ICD-10-CM | POA: Diagnosis present

## 2016-05-06 DIAGNOSIS — Z808 Family history of malignant neoplasm of other organs or systems: Secondary | ICD-10-CM

## 2016-05-06 DIAGNOSIS — R630 Anorexia: Secondary | ICD-10-CM

## 2016-05-06 LAB — BASIC METABOLIC PANEL
Anion gap: 10 (ref 5–15)
BUN: 57 mg/dL — ABNORMAL HIGH (ref 6–20)
CO2: 27 mmol/L (ref 22–32)
Calcium: 11.6 mg/dL — ABNORMAL HIGH (ref 8.9–10.3)
Chloride: 88 mmol/L — ABNORMAL LOW (ref 101–111)
Creatinine, Ser: 1.88 mg/dL — ABNORMAL HIGH (ref 0.61–1.24)
GFR calc Af Amer: 38 mL/min — ABNORMAL LOW (ref >60)
GFR calc non Af Amer: 33 mL/min — ABNORMAL LOW (ref >60)
Glucose, Bld: 133 mg/dL — ABNORMAL HIGH (ref 65–99)
Potassium: 4.8 mmol/L (ref 3.5–5.1)
Sodium: 125 mmol/L — ABNORMAL LOW (ref 135–145)

## 2016-05-06 LAB — CBC WITH DIFFERENTIAL/PLATELET
Basophils Absolute: 0 10*3/uL (ref 0–0.1)
Basophils Relative: 0 %
Eosinophils Absolute: 0 10*3/uL (ref 0–0.7)
Eosinophils Relative: 0 %
HCT: 27.9 % — ABNORMAL LOW (ref 40.0–52.0)
Hemoglobin: 9.3 g/dL — ABNORMAL LOW (ref 13.0–18.0)
Lymphocytes Relative: 4 %
Lymphs Abs: 0.2 10*3/uL — ABNORMAL LOW (ref 1.0–3.6)
MCH: 24.5 pg — ABNORMAL LOW (ref 26.0–34.0)
MCHC: 33.3 g/dL (ref 32.0–36.0)
MCV: 73.5 fL — ABNORMAL LOW (ref 80.0–100.0)
Monocytes Absolute: 0.5 10*3/uL (ref 0.2–1.0)
Monocytes Relative: 7 %
Neutro Abs: 5.7 10*3/uL (ref 1.4–6.5)
Neutrophils Relative %: 89 %
Platelets: 248 10*3/uL (ref 150–440)
RBC: 3.79 MIL/uL — ABNORMAL LOW (ref 4.40–5.90)
RDW: 19.1 % — ABNORMAL HIGH (ref 11.5–14.5)
WBC: 6.5 10*3/uL (ref 3.8–10.6)

## 2016-05-06 LAB — URIC ACID: Uric Acid, Serum: 9.2 mg/dL — ABNORMAL HIGH (ref 4.4–7.6)

## 2016-05-06 LAB — PROTIME-INR
INR: 1.16
Prothrombin Time: 14.9 seconds (ref 11.4–15.2)

## 2016-05-06 LAB — ECHOCARDIOGRAM COMPLETE: Weight: 2019 oz

## 2016-05-06 LAB — APTT: aPTT: 33 seconds (ref 24–36)

## 2016-05-06 MED ORDER — SODIUM CHLORIDE 0.9 % IV SOLN
INTRAVENOUS | Status: DC
  Administered 2016-05-06 – 2016-05-10 (×12): via INTRAVENOUS

## 2016-05-06 MED ORDER — ALLOPURINOL 100 MG PO TABS
100.0000 mg | ORAL_TABLET | Freq: Every day | ORAL | Status: DC
  Administered 2016-05-07 – 2016-05-12 (×6): 100 mg via ORAL
  Filled 2016-05-06 (×6): qty 1

## 2016-05-06 MED ORDER — POLYVINYL ALCOHOL 1.4 % OP SOLN
1.0000 [drp] | OPHTHALMIC | Status: DC | PRN
  Administered 2016-05-11: 1 [drp] via OPHTHALMIC
  Filled 2016-05-06 (×2): qty 15

## 2016-05-06 MED ORDER — PREDNISONE 5 MG PO TABS
5.0000 mg | ORAL_TABLET | Freq: Every day | ORAL | Status: DC
  Administered 2016-05-07 – 2016-05-09 (×3): 5 mg via ORAL
  Filled 2016-05-06 (×3): qty 1

## 2016-05-06 MED ORDER — SUCRALFATE 1 G PO TABS
1.0000 g | ORAL_TABLET | Freq: Four times a day (QID) | ORAL | Status: DC
  Administered 2016-05-06 – 2016-05-13 (×22): 1 g via ORAL
  Filled 2016-05-06 (×22): qty 1

## 2016-05-06 MED ORDER — ACETAMINOPHEN 325 MG PO TABS: 650.0000 mg | ORAL_TABLET | Freq: Four times a day (QID) | ORAL | Status: DC | PRN

## 2016-05-06 MED ORDER — ONDANSETRON HCL 4 MG/2ML IJ SOLN: 4.0000 mg | Freq: Four times a day (QID) | INTRAMUSCULAR | Status: DC | PRN

## 2016-05-06 MED ORDER — MECLIZINE HCL 12.5 MG PO TABS: 25.0000 mg | ORAL_TABLET | Freq: Three times a day (TID) | ORAL | Status: DC | PRN

## 2016-05-06 MED ORDER — METOPROLOL TARTRATE 25 MG PO TABS
25.0000 mg | ORAL_TABLET | Freq: Two times a day (BID) | ORAL | Status: DC
Start: 2016-05-06 — End: 2016-05-13
  Administered 2016-05-06 – 2016-05-13 (×12): 25 mg via ORAL
  Filled 2016-05-06 (×13): qty 1

## 2016-05-06 MED ORDER — ONDANSETRON HCL 4 MG PO TABS: 4.0000 mg | ORAL_TABLET | Freq: Four times a day (QID) | ORAL | Status: DC | PRN

## 2016-05-06 MED ORDER — FINASTERIDE 5 MG PO TABS
5.0000 mg | ORAL_TABLET | Freq: Every day | ORAL | Status: DC
  Administered 2016-05-07 – 2016-05-13 (×7): 5 mg via ORAL
  Filled 2016-05-06 (×7): qty 1

## 2016-05-06 MED ORDER — ADULT MULTIVITAMIN W/MINERALS CH
1.0000 | ORAL_TABLET | Freq: Every day | ORAL | Status: DC
  Administered 2016-05-07 – 2016-05-13 (×7): 1 via ORAL
  Filled 2016-05-06 (×7): qty 1

## 2016-05-06 MED ORDER — ACETAMINOPHEN 650 MG RE SUPP: 650.0000 mg | Freq: Four times a day (QID) | RECTAL | Status: DC | PRN

## 2016-05-06 MED ORDER — OXYBUTYNIN CHLORIDE ER 5 MG PO TB24
5.0000 mg | ORAL_TABLET | Freq: Every day | ORAL | Status: DC
  Administered 2016-05-07 – 2016-05-13 (×7): 5 mg via ORAL
  Filled 2016-05-06 (×7): qty 1

## 2016-05-06 MED ORDER — ALBUTEROL SULFATE (2.5 MG/3ML) 0.083% IN NEBU: 2.5000 mg | INHALATION_SOLUTION | Freq: Four times a day (QID) | RESPIRATORY_TRACT | Status: DC | PRN

## 2016-05-06 MED ORDER — TACROLIMUS 1 MG PO CAPS
1.0000 mg | ORAL_CAPSULE | Freq: Three times a day (TID) | ORAL | Status: DC
  Administered 2016-05-06: 1 mg via ORAL
  Filled 2016-05-06 (×3): qty 1

## 2016-05-06 MED ORDER — HYDROCODONE-ACETAMINOPHEN 5-325 MG PO TABS
1.0000 | ORAL_TABLET | ORAL | Status: DC | PRN
  Administered 2016-05-08 – 2016-05-10 (×5): 1 via ORAL
  Filled 2016-05-06 (×6): qty 1

## 2016-05-06 MED ORDER — LACTULOSE 10 GM/15ML PO SOLN
5.0000 g | Freq: Every day | ORAL | Status: DC | PRN
  Administered 2016-05-13: 10:00:00 10 g via ORAL
  Filled 2016-05-06: qty 30

## 2016-05-06 MED ORDER — PANTOPRAZOLE SODIUM 40 MG PO TBEC
40.0000 mg | DELAYED_RELEASE_TABLET | Freq: Every day | ORAL | Status: DC
  Administered 2016-05-06 – 2016-05-13 (×8): 40 mg via ORAL
  Filled 2016-05-06 (×8): qty 1

## 2016-05-06 MED ORDER — TAMSULOSIN HCL 0.4 MG PO CAPS
0.4000 mg | ORAL_CAPSULE | Freq: Every day | ORAL | Status: DC
  Administered 2016-05-07 – 2016-05-13 (×7): 0.4 mg via ORAL
  Filled 2016-05-06 (×7): qty 1

## 2016-05-06 MED ORDER — HEPARIN SODIUM (PORCINE) 5000 UNIT/ML IJ SOLN
5000.0000 [IU] | Freq: Three times a day (TID) | INTRAMUSCULAR | Status: DC
  Administered 2016-05-06 – 2016-05-08 (×5): 5000 [IU] via SUBCUTANEOUS
  Filled 2016-05-06 (×5): qty 1

## 2016-05-06 NOTE — Progress Notes (Addendum)
Matthew Brown is a 75 y.o. male s/p renal transplant with multiple abnormalities on scan who is seen for review of interval biopsy and discussion regarding direction of therapy.  HPI:  The patient was last seen in the medical oncology clinic on 11/102017.  At that time, he was seen for management of pain.  He had been seen in ER for excruciating pain.  He received Dilaudid with good pain relief.  He was taking oxycodone at home, but it was making him drowsy.  He was given a prescription for hydrocodone with Tylenol.  CT guided retroperitoneal node biopsy on 04/30/2016 revealed diffuse large B cell lymphoma.  Patient notes groin and buttock pain this AM.  Pain has been well controlled until this morning with hydrocodone 5 mg/ acetaminophen 325 mg twice a day.  Patient's wife notes that he slept all day yesterday.  His oral intake remains poor.  He has intermittent fever or sweats.   Past Medical History:  Diagnosis Date  . Benign prostatic hypertrophy   . Chronic headache 10/19/2015  . ED (erectile dysfunction)   . End stage renal disease ([place])   . Essential hypertension   . GERD (gastroesophageal reflux disease)   . GIB (gastrointestinal bleeding)    a. 06/6107 s/p R colic artery embolization;  b. 02/2015 EGD: duod ulcerative mass->Bx notable for coagulative necrosis - ? ischemia vs thrombosis-->coumadin d/c'd.  . Gout   . Hearing loss   . Hemorrhoids   . Hyperlipidemia   . Osteoarthrosis, unspecified whether generalized or localized, lower leg   . Persistent atrial fibrillation ([place])    a. CHA2DS2VASc = 3-->coumadin d/c'd 02/2015 2/2 recurrent GIB.  [name] Prostatitis   . Pulmonary hypertension    a. 10/2014 Echo: EF 60-65%, mild to mod MR, mildly dil LA, nl RV, PASP 54mHg.  .Marland KitchenRenal transplant recipient   . Ulcers of both great toes ([facility]     Past Surgical History:  Procedure Laterality Date  .  BACK SURGERY    . ESOPHAGOGASTRODUODENOSCOPY  03/13/15   severe esophagitis, ulcerated mass  . HERNIA REPAIR  1974  . PROSTATE ABLATION    . STOMACH SURGERY     blood vessel burst  . THROAT SURGERY    . TOTAL KNEE ARTHROPLASTY      Family History  Problem Relation Age of Onset  . Cancer Mother     throat  . Diabetes Brother   . Heart disease Brother   . Stroke Brother   . Hypertension Brother   . Diabetes Sister   . Heart disease Sister   . Hypertension Sister   . Diabetes Sister   . Diabetes Brother   . COPD Neg Hx   . Kidney disease Neg Hx   . Prostate cancer Neg Hx     Social History:  reports that he quit smoking about 37 years ago. His smoking use included Cigarettes. He has a 25.00 pack-year smoking history. He has never used smokeless tobacco. He reports that he does not drink alcohol or use drugs.  He stopped smoking in 1981.  He smoked 3 cigarettes/day.  [place]  The patient is accompanied by his wife, [name]  today.  Allergies: No Known Allergies  Current Medications: Current Outpatient Prescriptions  Medication Sig Dispense Refill  . albuterol (PROAIR HFA) 108 (90 BASE) MCG/ACT inhaler Inhale into the lungs.    .Marland Kitchenallopurinol (  ZYLOPRIM) 100 MG tablet Take 100 mg by mouth daily.      . finasteride (PROSCAR) 5 MG tablet Take 5 mg by mouth daily.      . furosemide (LASIX) 80 MG tablet     . HYDROcodone-acetaminophen (NORCO) 5-325 MG tablet Take 1 tablet by mouth every 4 (four) hours as needed for moderate pain. 30 tablet 0  . hydroxypropyl methylcellulose (ISOPTO TEARS) 2.5 % ophthalmic solution Place 1 drop into both eyes as needed.     . lactulose (CHRONULAC) 10 GM/15ML solution Take 7.5-15 mLs (5-10 g total) by mouth daily as needed for mild constipation. 473 mL 1  . magnesium oxide (MAG-OX) 400 (241.3 Mg) MG tablet TAKE 1 TABLET (400 MG TOTAL) BY MOUTH TWO (2) TIMES A DAY.  11  . meclizine (ANTIVERT) 25 MG tablet Take 1 tablet (25 mg total) by  mouth 3 (three) times daily as needed for dizziness. 30 tablet 0  . metoprolol tartrate (LOPRESSOR) 25 MG tablet TAKE 1 TABLET (25 MG TOTAL) BY MOUTH TWO (2) TIMES A DAY.  11  . Multiple Vitamin (MULTIVITAMIN) tablet Take 1 tablet by mouth daily.      . mycophenolate (MYFORTIC) 180 MG EC tablet Take 2 tablets twice a day, V42.0 kidney transplant, tx date 11/05/04, DAW1, brand medically necessary    . omeprazole (PRILOSEC) 20 MG capsule Take 20 mg by mouth 2 (two) times daily before a meal.     . oxybutynin (DITROPAN-XL) 5 MG 24 hr tablet Take 5 mg by mouth daily.    . predniSONE (DELTASONE) 5 MG tablet Take 5 mg by mouth daily with breakfast.     . sucralfate (CARAFATE) 1 G tablet Take 1 g by mouth 4 (four) times daily.  3  . tacrolimus (PROGRAF) 1 MG capsule Take 1 mg by mouth 3 (three) times daily. Reported on 08/16/2015    . Tamsulosin HCl (FLOMAX) 0.4 MG CAPS Take 0.4 mg by mouth daily.      [name] oxyCODONE-acetaminophen (ROXICET) 5-325 MG tablet Take 0.5-1 tablets by mouth every 6 (six) hours as needed for severe pain. (Patient not taking: Reported on 05/06/2016) 30 tablet 0   No current facility-administered medications for this visit.     Review of Systems:  GENERAL:  Fatigue.  Off and on fevers and sweats.  Weight loss of 30 pounds. PERFORMANCE STATUS (ECOG):  1 HEENT:  No visual changes, sore throat, mouth sores or tenderness. Lungs: No shortness of breath or cough.  No hemoptysis. Cardiac:  No chest pain, palpitations, orthopnea, or PND. GI:  Poor appetite.  No nausea, vomiting, diarrhea, constipation, melena or hematochezia. GU:  Enlarged prostate.  Right sided groin pain.  No urgency, frequency, dysuria, or hematuria. Musculoskeletal:  Back pain and left hip pain.  No joint pain.  No muscle tenderness. Extremities:  No pain or swelling. Skin:  No rashes or skin changes. Neuro:  Chronic headaches.  No numbness or weakness, balance or coordination issues. Endocrine:  No diabetes,  thyroid issues, hot flashes or night sweats. Psych:  No mood changes, depression or anxiety. Pain:  Groin pain. Review of systems:  All other systems reviewed and found to be negative.  Physical Exam: Blood pressure 111/65, pulse 60, temperature (!) 96.3 F (35.7 C), resp. rate 18, weight 126 lb 3 oz (57.2 kg). GENERAL:  Thin elderly gentleman sitting in the exam room, uncomfortable. MENTAL STATUS:  Alert and oriented to person, place and time. HEAD:  [place].  Normocephalic, atraumatic,  face symmetric, no Cushingoid features. EYES:  Glasses.  Brown eyes.  Pupils equal round and reactive to light and accomodation.  No conjunctivitis or scleral icterus. ENT:  Oropharynx clear without lesion.  Edentulous.  Tongue normal. Mucous membranes dry.  RESPIRATORY:  Clear to auscultation without rales, wheezes or rhonchi. CARDIOVASCULAR:  Regular rate and rhythm without murmur, rub or gallop. ABDOMEN:  Soft, non-tender, with active bowel sounds, and no hepatosplenomegaly.  No masses.   GU:  No testicular masses.  No erythema.  No hernia. SKIN:  No rashes, ulcers or lesions. EXTREMITIES:  No edema, no skin discoloration or tenderness.  No palpable cords. NEUROLOGICAL: Unremarkable. PSYCH:  Appropriate.   Hospital Outpatient Visit on 05/06/2016  Component Date Value Ref Range Status  . Weight 05/06/2016 2019  oz In process  . BP 05/06/2016 111/65  mmHg In process  Appointment on 05/06/2016  Component Date Value Ref Range Status  . WBC 05/06/2016 6.5  3.8 - 10.6 K/uL Final  . RBC 05/06/2016 3.79* 4.40 - 5.90 MIL/uL Final  . Hemoglobin 05/06/2016 9.3* 13.0 - 18.0 g/dL Final  . HCT 05/06/2016 27.9* 40.0 - 52.0 % Final  . MCV 05/06/2016 73.5* 80.0 - 100.0 fL Final  . MCH 05/06/2016 24.5* 26.0 - 34.0 pg Final  . MCHC 05/06/2016 33.3  32.0 - 36.0 g/dL Final  . RDW 05/06/2016 19.1* 11.5 - 14.5 % Final  . Platelets 05/06/2016 248  150 - 440 K/uL Final  . Neutrophils Relative % 05/06/2016 89  %  Final  . Neutro Abs 05/06/2016 5.7  1.4 - 6.5 K/uL Final  . Lymphocytes Relative 05/06/2016 4  % Final  . Lymphs Abs 05/06/2016 0.2* 1.0 - 3.6 K/uL Final  . Monocytes Relative 05/06/2016 7  % Final  . Monocytes Absolute 05/06/2016 0.5  0.2 - 1.0 K/uL Final  . Eosinophils Relative 05/06/2016 0  % Final  . Eosinophils Absolute 05/06/2016 0.0  0 - 0.7 K/uL Final  . Basophils Relative 05/06/2016 0  % Final  . Basophils Absolute 05/06/2016 0.0  0 - 0.1 K/uL Final  . Sodium 05/06/2016 125* 135 - 145 mmol/L Final  . Potassium 05/06/2016 4.8  3.5 - 5.1 mmol/L Final  . Chloride 05/06/2016 88* 101 - 111 mmol/L Final  . CO2 05/06/2016 27  22 - 32 mmol/L Final  . Glucose, Bld 05/06/2016 133* 65 - 99 mg/dL Final  . BUN 05/06/2016 57* 6 - 20 mg/dL Final  . Creatinine, Ser 05/06/2016 1.88* 0.61 - 1.24 mg/dL Final  . Calcium 05/06/2016 11.6* 8.9 - 10.3 mg/dL Final  . GFR calc non Af Amer 05/06/2016 33* >60 mL/min Final  . GFR calc Af Amer 05/06/2016 38* >60 mL/min Final   Comment: (NOTE) The eGFR has been calculated using the CKD EPI equation. This calculation has not been validated in all clinical situations. eGFR's persistently <60 mL/min signify possible Chronic Kidney Disease.   . Anion gap 05/06/2016 10  5 - 15 Final    Assessment:  Matthew Brown is a 75 y.o. male s/p renal transplant (2007) with stage IV diffuse large B cell lymphoma.  He presented with a 2-3 month history of progressive back pain superimposed on chronic back pain.  MRI of the lumbar spine at [facility] on 04/07/2016 revealed multifocal bone lesions within the thoracic spine, lumbar spine, and sacrum.  Differential includes lymphoma, metastatic disease, and multiple myeloma. There was multifocal abdominal lymphadenopathy.  There was a 1.1 cm exophytic lesion at the upper pole  of the atrophic native right kidney.  CT guided retroperitoneal node biopsy on 04/30/2016 revealed diffuse large B cell lymphoma.  Work-up on 04/15/2016  revealed a hematocrit 28.9, hemoglobin 9.5, MCV 74.6, platelets 224,000, white count 7400 with an [place] of 6500.  SPEP revealed no monoclonal protein.  24 hour UPEP was negative.  Free light chain ratio was normal.  Normal labs included the following: ferritin (343), iron saturation (6%), TIBC (241; low), B12 (525), folate (27).  Reticulocyte count was 2%.  LDH was 409.  Uric acid was 7.7 (4.4 - 7.6).  PET scan on 04/28/2016 revealed bulky intensely hypermetabolic periaortic upper abdominal and mesenteric adenopathy concerning for high-grade lymphoma.  There was hypermetabolic liver metastasis.  There was hpermetabolic lesion involving the small bowel of the upper pelvis.  There were multiple sites of hypermetabolic skeletal metastasis. There was moderate volume right pneumothorax.  He was admitted at [facility] from 04/28/2016 - 04/30/2016.  He was seen by thoracic surgery.  His pneumothorax improved spontaneously.  He underwent CT guided biopsy of a retroperitoneal node on 04/30/2016.  Pathology is pending.  Plain films of the right femur revealed the lucent bone lesion in the right femoral neck  was poorly characterized.  There was no evidence for an acute fracture.  PTH was 106 (high) 04/30/2016 with a calcium of 11.2.  Etiology was c/w primary hyperparathyroidism.  PTH-related polypeptide was < 1.1 on 04/30/2016.  He has a history of renal failure s/p renal transplant.  He is on mycophenolate (MMF), tacrolimus (Prograf), and steroids  Creatinine has ranged between 1.43 - 1.93 in the past 6 months.  Symptomatically, he has new right groin pain.  He continues to lose weight.  Plan: 1.  Discuss diagnosis of diffuse large B cell lymphoma.  Discuss stage IV disease.  Discuss bone marrow for staging (if + will need IT therapy).  Discuss plans for port-a-cath placement.  Discuss plans for 6 cycles of mini-CHOP.  Side effects of chemotherapy reviewed including nausea, vomiting, myelosuppression, hair loss, renal  dysfunction, hemorrhagic cystitis, constipation, foot drop, jaw pain, and heart failure.  Patient scheduled for echo today.  Discuss Rituxan and potential allergic reaction and reactivation of hepatitis.  Discus possible tumor lysis syndrome and worsening renal function.   2.  Discuss conversation with Dr. Suzan Nailer (renal transplant physician).  Discuss continuation of Prograf and steroids.  Discuss discontinuation of mycophenolate (MMF). 3.  Labs today:  CBC with diff, BMP, uric acid, hepatitis B surface antigen, hepatitis B core antibody total, hepatitis C antibody, PT, PTT, G6PD assay. 4.  Preauth mini-RCHOP. 5.  Schedule chemotherapy teaching. 6.  Schedule port-a-cath placement. 7.  Schedule bone marrow aspirate and biopsy. 8.  RTC for MD assessment, labs (CBC with diff, CMP, LDH, uric acid) and mini-RCHOP .  Addendum:  Patient to return to clinic after echocardiogram and labs return.  Discuss possible admission to the hospital.   Lequita Asal, MD  05/06/2016, 12:51 PM

## 2016-05-06 NOTE — H&P (Signed)
[place] [facility] [place] NAME: Matthew Brown    MR#:  [id number]  DATE OF BIRTH:  1941-01-19  DATE OF ADMISSION:  05/06/2016  PRIMARY CARE PHYSICIAN: Enid Derry, MD   REQUESTING/REFERRING PHYSICIAN: Lequita Asal MD  CHIEF COMPLAINT:  No chief complaint on file.   HISTORY OF PRESENT ILLNESS: Matthew Brown  is a 75 y.o. male with a known history of hospitalized with spontaneous pneumothorax. Patient was conservatively treated without any intervention and discharged home. He does have a history of having multiple abnormal malady on his CT and was followed up by oncology and had a biopsy of the lymph node which revealed diffuse large B-cell lymphoma. He went to follow-up with oncology regarding further therapy and options. He was noted to have elevated uric acid and was noted to have elevated calcium and PTH level. He is being admitted for IV fluids and further management for possible tumor lysis syndrome.  PAST MEDICAL HISTORY:   Past Medical History:  Diagnosis Date  . Benign prostatic hypertrophy   . Chronic headache 10/19/2015  . ED (erectile dysfunction)   . End stage renal disease ([place])   . Essential hypertension   . GERD (gastroesophageal reflux disease)   . GIB (gastrointestinal bleeding)    a. [id number] s/p R colic artery embolization;  b. 02/2015 EGD: duod ulcerative mass->Bx notable for coagulative necrosis - ? ischemia vs thrombosis-->coumadin d/c'd.  . Gout   . Hearing loss   . Hemorrhoids   . Hyperlipidemia   . Osteoarthrosis, unspecified whether generalized or localized, lower leg   . Persistent atrial fibrillation ([place])    a. CHA2DS2VASc = 3-->coumadin d/c'd 02/2015 2/2 recurrent GIB.  [name] Prostatitis   . Pulmonary hypertension    a. 10/2014 Echo: EF 60-65%, mild to mod MR, mildly dil LA, nl RV, PASP 64mmHg.  [name] Renal transplant recipient   . Ulcers of both great toes ([place])     PAST SURGICAL HISTORY: Past Surgical History:   Procedure Laterality Date  . BACK SURGERY    . ESOPHAGOGASTRODUODENOSCOPY  03/13/15   severe esophagitis, ulcerated mass  . HERNIA REPAIR  1974  . PROSTATE ABLATION    . STOMACH SURGERY     blood vessel burst  . THROAT SURGERY    . TOTAL KNEE ARTHROPLASTY      SOCIAL HISTORY:  Social History  Substance Use Topics  . Smoking status: Former Smoker    Packs/day: 1.00    Years: 25.00    Types: Cigarettes    Quit date: 06/23/1978  . Smokeless tobacco: Never Used  . Alcohol use No    FAMILY HISTORY:  Family History  Problem Relation Age of Onset  . Cancer Mother     throat  . Diabetes Brother   . Heart disease Brother   . Stroke Brother   . Hypertension Brother   . Diabetes Sister   . Heart disease Sister   . Hypertension Sister   . Diabetes Sister   . Diabetes Brother   . COPD Neg Hx   . Kidney disease Neg Hx   . Prostate cancer Neg Hx     DRUG ALLERGIES: No Known Allergies  REVIEW OF SYSTEMS:   CONSTITUTIONAL: No fever,Positive fatigue and weakness.  EYES: No blurred or double vision.  EARS, NOSE, AND THROAT: No tinnitus or ear pain.  RESPIRATORY: No cough, shortness of breath, wheezing or hemoptysis.  CARDIOVASCULAR: No chest pain, orthopnea, edema.  GASTROINTESTINAL: No  nausea, vomiting, diarrhea or abdominal pain.  GENITOURINARY: No dysuria, hematuria.  ENDOCRINE: No polyuria, nocturia,  HEMATOLOGY: No anemia, easy bruising or bleeding SKIN: No rash or lesion. MUSCULOSKELETAL: No joint pain or arthritis.   NEUROLOGIC: No tingling, numbness, weakness.  PSYCHIATRY: No anxiety or depression.   MEDICATIONS AT HOME:  Prior to Admission medications   Medication Sig Start Date End Date Taking? Authorizing Provider  albuterol (PROAIR HFA) 108 (90 BASE) MCG/ACT inhaler Inhale into the lungs. 02/20/14   Historical Provider, MD  allopurinol (ZYLOPRIM) 100 MG tablet Take 100 mg by mouth daily.      Historical Provider, MD  finasteride (PROSCAR) 5 MG tablet Take 5 mg  by mouth daily.      Historical Provider, MD  furosemide (LASIX) 80 MG tablet  04/01/16   Historical Provider, MD  HYDROcodone-acetaminophen (NORCO) 5-325 MG tablet Take 1 tablet by mouth every 4 (four) hours as needed for moderate pain. 05/02/16   Lequita Asal, MD  hydroxypropyl methylcellulose (ISOPTO TEARS) 2.5 % ophthalmic solution Place 1 drop into both eyes as needed.     Historical Provider, MD  lactulose (CHRONULAC) 10 GM/15ML solution Take 7.5-15 mLs (5-10 g total) by mouth daily as needed for mild constipation. 11/14/15   Arnetha Courser, MD  magnesium oxide (MAG-OX) 400 (241.3 Mg) MG tablet TAKE 1 TABLET (400 MG TOTAL) BY MOUTH TWO (2) TIMES A DAY. 07/22/15   Historical Provider, MD  meclizine (ANTIVERT) 25 MG tablet Take 1 tablet (25 mg total) by mouth 3 (three) times daily as needed for dizziness. 03/25/16   Arnetha Courser, MD  metoprolol tartrate (LOPRESSOR) 25 MG tablet TAKE 1 TABLET (25 MG TOTAL) BY MOUTH TWO (2) TIMES A DAY. 08/22/15   Historical Provider, MD  Multiple Vitamin (MULTIVITAMIN) tablet Take 1 tablet by mouth daily.      Historical Provider, MD  mycophenolate (MYFORTIC) 180 MG EC tablet Take 2 tablets twice a day, V42.0 kidney transplant, tx date 11/05/04, DAW1, brand medically necessary 10/13/14   Historical Provider, MD  omeprazole (PRILOSEC) 20 MG capsule Take 20 mg by mouth 2 (two) times daily before a meal.  12/19/14   Historical Provider, MD  oxybutynin (DITROPAN-XL) 5 MG 24 hr tablet Take 5 mg by mouth daily.    Historical Provider, MD  oxyCODONE-acetaminophen (ROXICET) 5-325 MG tablet Take 0.5-1 tablets by mouth every 6 (six) hours as needed for severe pain. Patient not taking: Reported on 05/06/2016 01/03/16   Arnetha Courser, MD  predniSONE (DELTASONE) 5 MG tablet Take 5 mg by mouth daily with breakfast.  11/23/13   Historical Provider, MD  sucralfate (CARAFATE) 1 G tablet Take 1 g by mouth 4 (four) times daily. 04/18/15   Historical Provider, MD  tacrolimus (PROGRAF)  1 MG capsule Take 1 mg by mouth 3 (three) times daily. Reported on 08/16/2015    Historical Provider, MD  Tamsulosin HCl (FLOMAX) 0.4 MG CAPS Take 0.4 mg by mouth daily.      Historical Provider, MD      PHYSICAL EXAMINATION:   VITAL SIGNS: Blood pressure (!) 119/44, pulse 66, temperature 97.5 F (36.4 C), temperature source Oral, SpO2 100 %.  GENERAL:  75 y.o.-year-old patient lying in the bed with no acute distress.  EYES: Pupils equal, round, reactive to light and accommodation. No scleral icterus. Extraocular muscles intact.  HEENT: Head atraumatic, normocephalic. Oropharynx and nasopharynx clear.  NECK:  Supple, no jugular venous distention. No thyroid enlargement, no tenderness.  LUNGS: Normal  breath sounds bilaterally, no wheezing, rales,rhonchi or crepitation. No use of accessory muscles of respiration.  CARDIOVASCULAR: S1, S2 normal. No murmurs, rubs, or gallops.  ABDOMEN: Soft, nontender, nondistended. Bowel sounds present. No organomegaly or mass.  EXTREMITIES: No pedal edema, cyanosis, or clubbing.  NEUROLOGIC: Cranial nerves II through XII are intact. Muscle strength 5/5 in all extremities. Sensation intact. Gait not checked.  PSYCHIATRIC: The patient is alert and oriented x 3.  SKIN: No obvious rash, lesion, or ulcer.   LABORATORY PANEL:   CBC  Recent Labs Lab 05/06/16 1147  WBC 6.5  HGB 9.3*  HCT 27.9*  PLT 248  MCV 73.5*  MCH 24.5*  MCHC 33.3  RDW 19.1*  LYMPHSABS 0.2*  MONOABS 0.5  EOSABS 0.0  BASOSABS 0.0   ------------------------------------------------------------------------------------------------------------------  Chemistries   Recent Labs Lab 04/30/16 0425 05/06/16 1147  NA 126* 125*  K 5.1 4.8  CL 86* 88*  CO2 30 27  GLUCOSE 114* 133*  BUN 52* 57*  CREATININE 1.93* 1.88*  CALCIUM 11.2*  11.2* 11.6*   ------------------------------------------------------------------------------------------------------------------ estimated  creatinine clearance is 27 mL/min (by C-G formula based on SCr of 1.88 mg/dL (H)). ------------------------------------------------------------------------------------------------------------------ No results for input(s): TSH, T4TOTAL, T3FREE, THYROIDAB in the last 72 hours.  Invalid input(s): FREET3   Coagulation profile  Recent Labs Lab 04/29/16 2113 05/06/16 1147  INR 1.20 1.16   ------------------------------------------------------------------------------------------------------------------- No results for input(s): DDIMER in the last 72 hours. -------------------------------------------------------------------------------------------------------------------  Cardiac Enzymes No results for input(s): CKMB, TROPONINI, MYOGLOBIN in the last 168 hours.  Invalid input(s): CK ------------------------------------------------------------------------------------------------------------------ Invalid input(s): POCBNP  ---------------------------------------------------------------------------------------------------------------  Urinalysis    Component Value Date/Time   APPEARANCEUR Clear 10/09/2015 0849   GLUCOSEU Negative 10/09/2015 0849   BILIRUBINUR neg 10/19/2015 1608   BILIRUBINUR Negative 10/09/2015 0849   PROTEINUR neg 10/19/2015 1608   PROTEINUR 2+ (A) 10/09/2015 0849   UROBILINOGEN 0.2 10/19/2015 1608   NITRITE neg 10/19/2015 1608   NITRITE Negative 10/09/2015 0849   LEUKOCYTESUR Negative 10/19/2015 1608   LEUKOCYTESUR Negative 10/09/2015 0849     RADIOLOGY: No results found.  EKG: Orders placed or performed in visit on 06/21/15  . EKG 12-Lead    IMPRESSION AND PLAN: Patient is a 75 year old with new diagnosis of large B-cell lymphoma sent by direct admit from oncology due to concern for   tumor lysis syndrome  1. Tumor lysis syndrome We will give patient IV fluids and hold his Lasix Oncology will be consulted  2. Chronic kidney disease with renal  transplant Oncology has discussed with Dr. Juleen China  they plan to hold his Mycofortic Hold nephrotoxic agents Continue Prograf and prednisone  3. Hypercalcemia with elevated PTH needs outpatient follow-up with endocrinology  4. History of gout continue allopurinol  5, BPH continue Proscar   6. Essential hypertension continue metoprolol  All the records are reviewed and case discussed with ED provider. Management plans discussed with the patient, family and they are in agreement.  CODE STATUS: Code Status History    Date Active Date Inactive Code Status Order ID Comments User Context   04/29/2016  1:24 AM 04/30/2016  9:35 PM Full Code [id number]  Lance Coon, MD Inpatient       TOTAL TIME TAKING CARE OF THIS PATIENT:55 minutes.    Dustin Flock M.D on 05/06/2016 at 4:03 PM  Between 7am to 6pm - Pager - [phone]  After 6pm go to [email or web address] - password [email or web address] [facility]  [place] [facility]  Office  [phone]  CC: Primary care physician; Rip Harbour  Sanda Klein, MD

## 2016-05-06 NOTE — Progress Notes (Signed)
Patient continues to complain of fatigue.  Also having pain 8/10 in groin this morning.

## 2016-05-06 NOTE — Progress Notes (Signed)
START ON PATHWAY REGIMEN - Lymphoma and CLL  LYOS231: R-CHOP q21 Days x 6 Cycles   A cycle is every 21 days:     Rituximab (Rituxan(R)) 375 mg/m2 in _____ mL NS IV day 1 only. Dose Mod: None     Cyclophosphamide (Cytoxan(R)) 750 mg/m2 in 250 mL NS IV over 30 minutes day 1 only Dose Mod: None     Doxorubicin (Adriamycin(R)) 50 mg/m2 IV push day 1 only Dose Mod: None     Vincristine (Oncovin(R)) 1.4 mg/m2; MAX 2 mg in 50 mL normal saline IV over 20 minutes on day 1 only. (MAX DOSE 2 mg) Dose Mod: None     Prednisone 100 mg flat dose orally daily days 1,2,3,4,5. Dose Mod: None Additional Orders: Hepatitis B&C testing recommended prior to rituximab use on all patients. Final concentration of rituximab must be between 1 and 4 mg/mL.  **Always confirm dose/schedule in your pharmacy ordering system**    Patient Characteristics: Diffuse Large B Cell, First Line, Stage III and IV Disease Type: Not Applicable Disease Type: Diffuse Large B Cell Line of therapy: First Line [place] Stage: IVBE  Intent of Therapy: Curative Intent, Discussed with Patient

## 2016-05-06 NOTE — Progress Notes (Signed)
Called the floor  To room 127, Freddie Breech is the RN taking this pt. Pt has just been dx with lymphoma. He needs port placed, bone marrow bx.  He only has one kidney and it is a transplant kidney and he takes meds for it because of the transplant. His creat is rising today 1.88, uric acid elevated, na level is low.  Dr. Mike Gip spoke to [facility] about him and he rec: that pt get admitted because of the lab abn. And the fact that he only has 1 kidney and it is a donor. Dr. Mike Gip will try entering order for consult for port placement tom. And he has BM bx. Scheduled for outpt on Thursday. She will call Dr. Posey Pronto for orders.

## 2016-05-06 NOTE — Progress Notes (Signed)
  Echocardiogram 2D Echocardiogram has been performed.  Jennette Dubin 05/06/2016, 12:01 PM

## 2016-05-07 ENCOUNTER — Other Ambulatory Visit: Payer: Self-pay | Admitting: Physician Assistant

## 2016-05-07 ENCOUNTER — Encounter
Admission: AD | Disposition: A | Discharge status: Home / Self Care | Payer: Self-pay | Source: Ambulatory Visit | Attending: Internal Medicine

## 2016-05-07 DIAGNOSIS — I10 Essential (primary) hypertension: Secondary | ICD-10-CM

## 2016-05-07 DIAGNOSIS — N186 End stage renal disease: Secondary | ICD-10-CM

## 2016-05-07 DIAGNOSIS — C8590 Non-Hodgkin lymphoma, unspecified, unspecified site: Secondary | ICD-10-CM

## 2016-05-07 HISTORY — PX: PERIPHERAL VASCULAR CATHETERIZATION: SHX172C

## 2016-05-07 LAB — BASIC METABOLIC PANEL
ANION GAP: 7 (ref 5–15)
BUN: 47 mg/dL — ABNORMAL HIGH (ref 6–20)
CALCIUM: 10.8 mg/dL — AB (ref 8.9–10.3)
CO2: 26 mmol/L (ref 22–32)
Chloride: 96 mmol/L — ABNORMAL LOW (ref 101–111)
Creatinine, Ser: 1.65 mg/dL — ABNORMAL HIGH (ref 0.61–1.24)
GFR, EST AFRICAN AMERICAN: 45 mL/min — AB (ref >60)
GFR, EST NON AFRICAN AMERICAN: 39 mL/min — AB (ref >60)
Glucose, Bld: 95 mg/dL (ref 65–99)
POTASSIUM: 4.8 mmol/L (ref 3.5–5.1)
Sodium: 129 mmol/L — ABNORMAL LOW (ref 135–145)

## 2016-05-07 LAB — CBC
HEMATOCRIT: 25.2 % — AB (ref 40.0–52.0)
Hemoglobin: 8.4 g/dL — ABNORMAL LOW (ref 13.0–18.0)
MCH: 24.7 pg — ABNORMAL LOW (ref 26.0–34.0)
MCHC: 33.3 g/dL (ref 32.0–36.0)
MCV: 74.3 fL — ABNORMAL LOW (ref 80.0–100.0)
PLATELETS: 213 10*3/uL (ref 150–440)
RBC: 3.39 MIL/uL — AB (ref 4.40–5.90)
RDW: 19.3 % — AB (ref 11.5–14.5)
WBC: 5 10*3/uL (ref 3.8–10.6)

## 2016-05-07 LAB — GLUCOSE 6 PHOSPHATE DEHYDROGENASE
G6PDH: 11 U/g{Hb} (ref 4.6–13.5)
Hemoglobin: 9.3 g/dL — ABNORMAL LOW (ref 12.6–17.7)

## 2016-05-07 LAB — HEPATITIS B CORE ANTIBODY, TOTAL: Hep B Core Total Ab: NEGATIVE

## 2016-05-07 LAB — HEPATITIS C ANTIBODY: HCV Ab: <0.1 s/co ratio (ref 0.0–0.9)

## 2016-05-07 LAB — HEPATITIS B SURFACE ANTIGEN: Hepatitis B Surface Ag: NEGATIVE

## 2016-05-07 SURGERY — PORTA CATH INSERTION
Anesthesia: Moderate Sedation

## 2016-05-07 MED ORDER — FENTANYL CITRATE (PF) 100 MCG/2ML IJ SOLN
INTRAMUSCULAR | Status: DC | PRN
  Administered 2016-05-07 (×4): 25 ug via INTRAVENOUS

## 2016-05-07 MED ORDER — SODIUM CHLORIDE 0.9 % IV SOLN: INTRAVENOUS | Status: DC

## 2016-05-07 MED ORDER — ENSURE ENLIVE PO LIQD
237.0000 mL | Freq: Three times a day (TID) | ORAL | Status: DC
  Administered 2016-05-07 – 2016-05-13 (×15): 237 mL via ORAL

## 2016-05-07 MED ORDER — FENTANYL CITRATE (PF) 100 MCG/2ML IJ SOLN
INTRAMUSCULAR | Status: AC
  Filled 2016-05-07: qty 2

## 2016-05-07 MED ORDER — TACROLIMUS 1 MG PO CAPS
3.0000 mg | ORAL_CAPSULE | Freq: Two times a day (BID) | ORAL | Status: DC
  Administered 2016-05-07 – 2016-05-13 (×13): 3 mg via ORAL
  Filled 2016-05-07 (×14): qty 3

## 2016-05-07 MED ORDER — MIDAZOLAM HCL 2 MG/2ML IJ SOLN
INTRAMUSCULAR | Status: DC | PRN
  Administered 2016-05-07 (×2): 1 mg via INTRAVENOUS

## 2016-05-07 MED ORDER — DEXTROSE 5 % IV SOLN
1.5000 g | Freq: Once | INTRAVENOUS | Status: AC
  Administered 2016-05-07: 1.5 g via INTRAVENOUS

## 2016-05-07 MED ORDER — SODIUM CHLORIDE 0.9 % IR SOLN
Freq: Once | Status: DC
  Filled 2016-05-07: qty 2

## 2016-05-07 MED ORDER — MIDAZOLAM HCL 5 MG/5ML IJ SOLN
INTRAMUSCULAR | Status: AC
Start: 2016-05-07 — End: 2016-05-07
  Filled 2016-05-07: qty 5

## 2016-05-07 MED ORDER — LIDOCAINE-EPINEPHRINE (PF) 1 %-1:200000 IJ SOLN
INTRAMUSCULAR | Status: AC
  Filled 2016-05-07: qty 30

## 2016-05-07 MED ORDER — HEPARIN (PORCINE) IN NACL 2-0.9 UNIT/ML-% IJ SOLN
INTRAMUSCULAR | Status: AC
  Filled 2016-05-07: qty 500

## 2016-05-07 SURGICAL SUPPLY — 10 items
BAG DECANTER STRL (MISCELLANEOUS) ×2 IMPLANT
KIT PORT POWER 8FR ISP CVUE (Catheter) ×2 IMPLANT
PACK ANGIOGRAPHY (CUSTOM PROCEDURE TRAY) ×2 IMPLANT
PAD GROUND ADULT SPLIT (MISCELLANEOUS) ×2 IMPLANT
PENCIL ELECTRO HAND CTR (MISCELLANEOUS) ×2 IMPLANT
PREP CHG 10.5 TEAL (MISCELLANEOUS) ×2 IMPLANT
SUT MNCRL AB 4-0 PS2 18 (SUTURE) ×2 IMPLANT
SUT PROLENE 0 CT 1 30 (SUTURE) ×2 IMPLANT
SUTURE VIC 3-0 (SUTURE) ×2 IMPLANT
TOWEL OR 17X26 4PK STRL BLUE (TOWEL DISPOSABLE) ×2 IMPLANT

## 2016-05-07 NOTE — Consult Note (Signed)
[facility]  Date of admission:  05/06/2016  Inpatient day:  05/06/2016  Consulting physician:  Dr. Dustin Flock  Reason for Consultation:  Lymphoma sent from your office.  Chief Complaint: Matthew Brown is a 75 y.o. male s/p renal transplant and stage IV diffuse large cell lymphoma who is admitted with tumor lysis syndrome prior to initiation of systemic chemotherapy.  HPI:   The patient has a history of renal failure s/p renal transplant.  He is on mycophenolate (MMF), tacrolimus (Prograf), and steroids  Creatinine has ranged between 1.43 - 1.93 in the past 6 months.  He presented with a 2-3 month history of progressive back pain superimposed on chronic back pain.  MRI of the lumbar spine at [facility] on 04/07/2016 revealed multifocal bone lesions within the thoracic spine, lumbar spine, and sacrum.  CT guided retroperitoneal node biopsy on 04/30/2016 revealed diffuse large B cell lymphoma.  PET scan on 04/28/2016 revealed bulky intensely hypermetabolic periaortic upper abdominal and mesenteric adenopathy concerning for high-grade lymphoma.  There was hypermetabolic liver metastasis.  There was hpermetabolic lesion involving the small bowel of the upper pelvis.  There were multiple sites of hypermetabolic skeletal metastasis. There was moderate volume right pneumothorax.  He was admitted at [facility] from 04/28/2016 - 04/30/2016.   His pneumothorax improved spontaneously.  He underwent CT guided biopsy of a retroperitoneal node on 04/30/2016.  PTH was 106 (high) 04/30/2016 with a calcium of 11.2.   PTH-related polypeptide was < 1.1 on 04/30/2016.  Etiology was c/w primary hyperparathyroidism.   Endocrinology has been consulted.  He was seen in clinic today.  MMF was discontinued.  He had multiple electrolyte abnormalities.  We discussed issues with tumor lysis syndrome which would likely worsen with treatment and his renal transplant.  Decision was made to initiate  treatment while hospitalized.    Past Medical History:  Diagnosis Date  . Benign prostatic hypertrophy   . Chronic headache 10/19/2015  . ED (erectile dysfunction)   . End stage renal disease ([place])   . Essential hypertension   . GERD (gastroesophageal reflux disease)   . GIB (gastrointestinal bleeding)    a. 02/1637 s/p R colic artery embolization;  b. 02/2015 EGD: duod ulcerative mass->Bx notable for coagulative necrosis - ? ischemia vs thrombosis-->coumadin d/c'd.  . Gout   . Hearing loss   . Hemorrhoids   . Hyperlipidemia   . Osteoarthrosis, unspecified whether generalized or localized, lower leg   . Persistent atrial fibrillation ([place])    a. CHA2DS2VASc = 3-->coumadin d/c'd 02/2015 2/2 recurrent GIB.  [name] Prostatitis   . Pulmonary hypertension    a. 10/2014 Echo: EF 60-65%, mild to mod MR, mildly dil LA, nl RV, PASP 12mHg.  .Marland KitchenRenal transplant recipient   . Ulcers of both great toes ([facility]     Past Surgical History:  Procedure Laterality Date  . BACK SURGERY    . ESOPHAGOGASTRODUODENOSCOPY  03/13/15   severe esophagitis, ulcerated mass  . HERNIA REPAIR  1974  . PROSTATE ABLATION    . STOMACH SURGERY     blood vessel burst  . THROAT SURGERY    . TOTAL KNEE ARTHROPLASTY      Family History  Problem Relation Age of Onset  . Cancer Mother     throat  . Diabetes Brother   . Heart disease Brother   . Stroke Brother   . Hypertension Brother   . Diabetes Sister   . Heart disease Sister   . Hypertension  Sister   . Diabetes Sister   . Diabetes Brother   . COPD Neg Hx   . Kidney disease Neg Hx   . Prostate cancer Neg Hx     Social History:  reports that he quit smoking about 37 years ago. His smoking use included Cigarettes. He has a 25.00 pack-year smoking history. He has never used smokeless tobacco. He reports that he does not drink alcohol or use drugs.  He stopped smoking in 1981.  He smoked 3 cigarettes/day.  He lives in [place].  The patient is accompanied by his wife,  [name],  today.  Allergies: No Known Allergies  Medications Prior to Admission  Medication Sig Dispense Refill  . allopurinol (ZYLOPRIM) 100 MG tablet Take 100 mg by mouth daily.      . finasteride (PROSCAR) 5 MG tablet Take 5 mg by mouth daily.      . furosemide (LASIX) 80 MG tablet     . metoprolol tartrate (LOPRESSOR) 25 MG tablet TAKE 1 TABLET (25 MG TOTAL) BY MOUTH TWO (2) TIMES A DAY.  11  . Multiple Vitamin (MULTIVITAMIN) tablet Take 1 tablet by mouth daily.      . mycophenolate (MYFORTIC) 180 MG EC tablet Take 2 tablets twice a day, V42.0 kidney transplant, tx date 11/05/04, DAW1, brand medically necessary    . omeprazole (PRILOSEC) 20 MG capsule Take 20 mg by mouth 2 (two) times daily before a meal.     . predniSONE (DELTASONE) 5 MG tablet Take 5 mg by mouth daily with breakfast.     . tacrolimus (PROGRAF) 1 MG capsule Take 1 mg by mouth 3 (three) times daily. Reported on 08/16/2015    . Tamsulosin HCl (FLOMAX) 0.4 MG CAPS Take 0.4 mg by mouth daily.      [name] albuterol (PROAIR HFA) 108 (90 BASE) MCG/ACT inhaler Inhale into the lungs.    [name] HYDROcodone-acetaminophen (NORCO) 5-325 MG tablet Take 1 tablet by mouth every 4 (four) hours as needed for moderate pain. 30 tablet 0  . hydroxypropyl methylcellulose (ISOPTO TEARS) 2.5 % ophthalmic solution Place 1 drop into both eyes as needed.     . lactulose (CHRONULAC) 10 GM/15ML solution Take 7.5-15 mLs (5-10 g total) by mouth daily as needed for mild constipation. 473 mL 1  . magnesium oxide (MAG-OX) 400 (241.3 Mg) MG tablet TAKE 1 TABLET (400 MG TOTAL) BY MOUTH TWO (2) TIMES A DAY.  11  . meclizine (ANTIVERT) 25 MG tablet Take 1 tablet (25 mg total) by mouth 3 (three) times daily as needed for dizziness. 30 tablet 0  . oxybutynin (DITROPAN-XL) 5 MG 24 hr tablet Take 5 mg by mouth daily.    [name] oxyCODONE-acetaminophen (ROXICET) 5-325 MG tablet Take 0.5-1 tablets by mouth every 6 (six) hours as needed for severe pain. (Patient not taking: Reported  on 05/06/2016) 30 tablet 0  . sucralfate (CARAFATE) 1 G tablet Take 1 g by mouth 4 (four) times daily.  3    Review of Systems: PERFORMANCE STATUS (ECOG):  1 HEENT:  No visual changes, sore throat, mouth sores or tenderness. Lungs: No shortness of breath or cough.  No hemoptysis. Cardiac:  No chest pain, palpitations, orthopnea, or PND. GI:  Poor appetite.  No nausea, vomiting, diarrhea, constipation, melena or hematochezia. GU:  Enlarged prostate.  Right sided groin pain.  No urgency, frequency, dysuria, or hematuria. Musculoskeletal:  Back pain and left hip pain.  No joint pain.  No muscle tenderness. Extremities:  No pain or  swelling. Skin:  No rashes or skin changes. Neuro:  Chronic headaches.  No numbness or weakness, balance or coordination issues. Endocrine:  No diabetes, thyroid issues, hot flashes or night sweats. Psych:  No mood changes, depression or anxiety. Pain:  Groin pain. Review of systems:  All other systems reviewed and found to be negative.  Physical Exam:  Blood pressure (!) 129/50, pulse (!) 56, temperature 97.8 F (36.6 C), temperature source Oral, resp. rate 20, height _0  (1.727 m), weight 126 lb 3 oz (57.2 kg), SpO2 100 %.  GENERAL:  Thin elderly gentleman sitting in the exam room, uncomfortable secondary to pain in his right groin. MENTAL STATUS:  Alert and oriented to person, place and time. HEAD:  [place].  Normocephalic, atraumatic, face symmetric, no Cushingoid features. EYES:  Glasses.  Brown eyes.  Pupils equal round and reactive to light and accomodation.  No conjunctivitis or scleral icterus. ENT:  Oropharynx clear without lesion.  Edentulous.  Tongue normal. Mucous membranes dry.  RESPIRATORY:  Clear to auscultation without rales, wheezes or rhonchi. CARDIOVASCULAR:  Regular rate and rhythm without murmur, rub or gallop. ABDOMEN:  Soft, non-tender, with active bowel sounds, and no hepatosplenomegaly.  No masses.   GU:  No testicular masses.  No  erythema.  No hernia. SKIN:  No rashes, ulcers or lesions. EXTREMITIES:  No edema, no skin discoloration or tenderness.  No palpable cords. NEUROLOGICAL: Unremarkable. PSYCH:  Appropriate.   Results for orders placed or performed in visit on 05/06/16 (from the past 48 hour(s))  CBC with Differential     Status: Abnormal   Collection Time: 05/06/16 11:47 AM  Result Value Ref Range   WBC 6.5 3.8 - 10.6 K/uL   RBC 3.79 (L) 4.40 - 5.90 MIL/uL   Hemoglobin 9.3 (L) 13.0 - 18.0 g/dL   HCT 27.9 (L) 40.0 - 52.0 %   MCV 73.5 (L) 80.0 - 100.0 fL   MCH 24.5 (L) 26.0 - 34.0 pg   MCHC 33.3 32.0 - 36.0 g/dL   RDW 19.1 (H) 11.5 - 14.5 %   Platelets 248 150 - 440 K/uL   Neutrophils Relative % 89 %   Neutro Abs 5.7 1.4 - 6.5 K/uL   Lymphocytes Relative 4 %   Lymphs Abs 0.2 (L) 1.0 - 3.6 K/uL   Monocytes Relative 7 %   Monocytes Absolute 0.5 0.2 - 1.0 K/uL   Eosinophils Relative 0 %   Eosinophils Absolute 0.0 0 - 0.7 K/uL   Basophils Relative 0 %   Basophils Absolute 0.0 0 - 0.1 K/uL  Basic metabolic panel     Status: Abnormal   Collection Time: 05/06/16 11:47 AM  Result Value Ref Range   Sodium 125 (L) 135 - 145 mmol/L   Potassium 4.8 3.5 - 5.1 mmol/L   Chloride 88 (L) 101 - 111 mmol/L   CO2 27 22 - 32 mmol/L   Glucose, Bld 133 (H) 65 - 99 mg/dL   BUN 57 (H) 6 - 20 mg/dL   Creatinine, Ser 1.88 (H) 0.61 - 1.24 mg/dL   Calcium 11.6 (H) 8.9 - 10.3 mg/dL   GFR calc non Af Amer 33 (L) >60 mL/min   GFR calc Af Amer 38 (L) >60 mL/min    Comment: (NOTE) The eGFR has been calculated using the CKD EPI equation. This calculation has not been validated in all clinical situations. eGFR's persistently <60 mL/min signify possible Chronic Kidney Disease.    Anion gap 10 5 - 15  APTT     Status: None   Collection Time: 05/06/16 11:47 AM  Result Value Ref Range   aPTT 33 24 - 36 seconds  Protime-INR     Status: None   Collection Time: 05/06/16 11:47 AM  Result Value Ref Range   Prothrombin Time  14.9 11.4 - 15.2 seconds   INR 1.16   Uric acid     Status: Abnormal   Collection Time: 05/06/16 11:47 AM  Result Value Ref Range   Uric Acid, Serum 9.2 (H) 4.4 - 7.6 mg/dL   No results found.  Assessment:  The patient is a 75 y.o.  gentleman with post-transplant lymphoproliferative disorder (PTLD).  He has stage IV diffuse large B cell lymphoma.  He has early tumor lysis syndrome.  Renal function has declined.  Plan:   1. Oncology:  Discuss plan to expedite treatment.  Discussed with patient plan for bone marrow aspirate and biopsy.  If bone marrow positive, will need IT therapy as part of his treatment.  Patient needs port-a-cath placed.  Nursing to provide chemotherapy teaching.  Aggressive management of tumor lysis syndrome prior to and post chemotherapy (mini-RCHOP) to ensure patient safe for discharge to outpatient department. Hepatitis serologies sent secondary to plan for Rituxan (can reactive hepatitis).  Patient will receive inpatient chemotherapy.  2.  Hematology:  Anemia work-up on 04/15/2016 revealed an iron saturation of 6% and TIBC 241 c/w chronic disease.  Ferritin was 343.  B12 and folate were normal.  Reticulocyte count was 2% (low for level of anemia).  Bone marrow to assess for lymphoma ordered.  If patient requires blood products, they will be leukopoor and irradiated.  3.  Nephrology:  Patient is s/p renal transplant on MMF, Prograf, and prednisone.  Please confirm Prograf dose with patient (I believe he is on 3 mg BID).  I have discussed with his transplant physician at [facility].  MMF to be discontinued. Hydration.  Continue allopurinol.  Monitor BMP, uric acid, phosphorus.  Await results of G6PD testing (ensure negative prior to administration of rasburicase if needed).  Consult nephrology.   Thank you for allowing me to participate in BRENN DEZIEL 's care.  I will follow him closely with you while hospitalized and after discharge in the outpatient department.   Lequita Asal, MD  05/06/2016

## 2016-05-07 NOTE — Consult Note (Signed)
[facility] Vascular Consult Note  MRN : [id number]  Matthew Brown is a 75 y.o. (04/27/1941) male who presents with chief complaint of No chief complaint on file. [name]  History of Present Illness: I am asked by Dr. Mike Gip to see the patient regarding port a cath placement. The patient is a 75 year old African-American male who presents with lymphoma. They're going to initiate chemotherapy and he will need a Port-A-Cath for durable venous access. He has a history of end-stage renal disease but now has a kidney transplant although he has baseline kidney dysfunction in that transplanted kidney. He has fatigue and lethargy. He had tumor lysis syndrome on admission but is improved today. He is prepared for his Port-A-Cath placement.   Current Facility-Administered Medications  Medication Dose Route Frequency Provider Last Rate Last Dose  . 0.9 %  sodium chloride infusion   Intravenous Continuous Dustin Flock, MD 125 mL/hr at 05/07/16 1048    . 0.9 %  sodium chloride infusion   Intravenous Continuous Algernon Huxley, MD      . Doug Sou Hold] acetaminophen (TYLENOL) tablet 650 mg  650 mg Oral Q6H PRN Dustin Flock, MD       Or  . Doug Sou Hold] acetaminophen (TYLENOL) suppository 650 mg  650 mg Rectal Q6H PRN Dustin Flock, MD      . Doug Sou Hold] albuterol (PROVENTIL) (2.5 MG/3ML) 0.083% nebulizer solution 2.5 mg  2.5 mg Inhalation Q6H PRN Dustin Flock, MD      . Doug Sou Hold] allopurinol (ZYLOPRIM) tablet 100 mg  100 mg Oral Daily Dustin Flock, MD   100 mg at 05/07/16 1217  . [MAR Hold] feeding supplement (ENSURE ENLIVE) (ENSURE ENLIVE) liquid 237 mL  237 mL Oral TID BM Epifanio Lesches, MD      . Doug Sou Hold] finasteride (PROSCAR) tablet 5 mg  5 mg Oral Daily Dustin Flock, MD      . Doug Sou Hold] gentamicin (GARAMYCIN) 80 mg in sodium chloride irrigation 0.9 % 500 mL irrigation   Irrigation Once Algernon Huxley, MD      . Doug Sou Hold] heparin injection 5,000 Units  5,000 Units Subcutaneous  Q8H Dustin Flock, MD   5,000 Units at 05/07/16 0605  . [MAR Hold] HYDROcodone-acetaminophen (NORCO/VICODIN) 5-325 MG per tablet 1 tablet  1 tablet Oral Q4H PRN Dustin Flock, MD      . Doug Sou Hold] lactulose (CHRONULAC) 10 GM/15ML solution 5-10 g  5-10 g Oral Daily PRN Dustin Flock, MD      . Doug Sou Hold] metoprolol tartrate (LOPRESSOR) tablet 25 mg  25 mg Oral BID Dustin Flock, MD   25 mg at 05/07/16 1216  . [MAR Hold] multivitamin with minerals tablet 1 tablet  1 tablet Oral Daily Dustin Flock, MD      . Doug Sou Hold] ondansetron (ZOFRAN) tablet 4 mg  4 mg Oral Q6H PRN Dustin Flock, MD       Or  . Doug Sou Hold] ondansetron (ZOFRAN) injection 4 mg  4 mg Intravenous Q6H PRN Dustin Flock, MD      . Doug Sou Hold] oxybutynin (DITROPAN-XL) 24 hr tablet 5 mg  5 mg Oral Daily Dustin Flock, MD      . Doug Sou Hold] pantoprazole (PROTONIX) EC tablet 40 mg  40 mg Oral Daily Dustin Flock, MD   40 mg at 05/07/16 1216  . [MAR Hold] polyvinyl alcohol (LIQUIFILM TEARS) 1.4 % ophthalmic solution 1 drop  1 drop Both Eyes PRN Dustin Flock, MD      . [  MAR Hold] predniSONE (DELTASONE) tablet 5 mg  5 mg Oral Q breakfast Dustin Flock, MD   5 mg at 05/07/16 1217  . [MAR Hold] sucralfate (CARAFATE) tablet 1 g  1 g Oral QID Dustin Flock, MD   1 g at 05/06/16 2211  . [MAR Hold] tacrolimus (PROGRAF) capsule 3 mg  3 mg Oral BID Epifanio Lesches, MD   3 mg at 05/07/16 1216  . [MAR Hold] tamsulosin (FLOMAX) capsule 0.4 mg  0.4 mg Oral Daily Dustin Flock, MD        Past Medical History:  Diagnosis Date  . Benign prostatic hypertrophy   . Chronic headache 10/19/2015  . ED (erectile dysfunction)   . End stage renal disease ([employer])   . Essential hypertension   . GERD (gastroesophageal reflux disease)   . GIB (gastrointestinal bleeding)    a. [id number] s/p R colic artery embolization;  b. 02/2015 EGD: duod ulcerative mass->Bx notable for coagulative necrosis - ? ischemia vs thrombosis-->coumadin d/c'd.  . Gout   .  Hearing loss   . Hemorrhoids   . Hyperlipidemia   . Osteoarthrosis, unspecified whether generalized or localized, lower leg   . Persistent atrial fibrillation ([place])    a. CHA2DS2VASc = 3-->coumadin d/c'd 02/2015 2/2 recurrent GIB.  [name] Prostatitis   . Pulmonary hypertension    a. 10/2014 Echo: EF 60-65%, mild to mod MR, mildly dil LA, nl RV, PASP 82mmHg.  [name] Renal transplant recipient   . Ulcers of both great toes [facility])     Past Surgical History:  Procedure Laterality Date  . BACK SURGERY    . ESOPHAGOGASTRODUODENOSCOPY  03/13/15   severe esophagitis, ulcerated mass  . HERNIA REPAIR  1974  . PROSTATE ABLATION    . STOMACH SURGERY     blood vessel burst  . THROAT SURGERY    . TOTAL KNEE ARTHROPLASTY      Social History Social History  Substance Use Topics  . Smoking status: Former Smoker    Packs/day: 1.00    Years: 25.00    Types: Cigarettes    Quit date: 06/23/1978  . Smokeless tobacco: Never Used  . Alcohol use No    Family History Family History  Problem Relation Age of Onset  . Cancer Mother     throat  . Diabetes Brother   . Heart disease Brother   . Stroke Brother   . Hypertension Brother   . Diabetes Sister   . Heart disease Sister   . Hypertension Sister   . Diabetes Sister   . Diabetes Brother   . COPD Neg Hx   . Kidney disease Neg Hx   . Prostate cancer Neg Hx     No Known Allergies   REVIEW OF SYSTEMS (Negative unless checked)  Constitutional: [x] Weight loss  [] Fever  [] Chills Cardiac: [] Chest pain   [] Chest pressure   [] Palpitations   [] Shortness of breath when laying flat   [] Shortness of breath at rest   [x] Shortness of breath with exertion. Vascular:  [] Pain in legs with walking   [] Pain in legs at rest   [] Pain in legs when laying flat   [] Claudication   [] Pain in feet when walking  [] Pain in feet at rest  [] Pain in feet when laying flat   [] History of DVT   [] Phlebitis   [] Swelling in legs   [] Varicose veins   [] Non-healing ulcers Pulmonary:    [] Uses home oxygen   [] Productive cough   [] Hemoptysis   [] Wheeze  [] COPD   []   Asthma Neurologic:  [] Dizziness  [] Blackouts   [] Seizures   [] History of stroke   [] History of TIA  [] Aphasia   [] Temporary blindness   [] Dysphagia   [] Weakness or numbness in arms   [] Weakness or numbness in legs Musculoskeletal:  [x] Arthritis   [] Joint swelling   [] Joint pain   [] Low back pain Hematologic:  [] Easy bruising  [] Easy bleeding   [] Hypercoagulable state   [x] Anemic  [] Hepatitis Gastrointestinal:  [] Blood in stool   [] Vomiting blood  [] Gastroesophageal reflux/heartburn   [] Difficulty swallowing. Genitourinary:  [x] Chronic kidney disease   [] Difficult urination  [] Frequent urination  [] Burning with urination   [] Blood in urine Skin:  [] Rashes   [] Ulcers   [] Wounds Psychological:  [] History of anxiety   []  History of major depression.  Physical Examination  Vitals:   05/07/16 1453 05/07/16 1457 05/07/16 1502 05/07/16 1507  BP: (!) 120/56 (!) 126/52  (!) 133/55  Pulse: (!) 56 65  71  Resp: 14 14  14   Temp:      TempSrc:      SpO2: 100% 100% 100% 100%  Weight:      Height:       Body mass index is 19.19 kg/m. Gen:  Cachectic African-American male not in apparent distress Head: [place]/AT, + temporalis wasting. Prominent temp pulse not noted. Ear/Nose/Throat: Hearing grossly intact, nares w/o erythema or drainage, oropharynx w/o Erythema/Exudate Eyes: Sclera non-icteric, conjunctiva clear Neck: Trachea midline.  No JVD.  Pulmonary:  Good air movement, respirations not labored, equal bilaterally.  Cardiac: RRR, normal S1, S2. Vascular:  Vessel Right Left  Radial Palpable Palpable                                   Gastrointestinal: soft, non-tender/non-distended. No guarding/reflex.  Musculoskeletal: M/S 5/5 throughout.  Extremities without ischemic changes.  No deformity or atrophy. No edema. Neurologic: Sensation grossly intact in extremities.  Symmetrical.  Speech is fluent. Motor exam as  listed above. Psychiatric: Judgment intact, Mood & affect appropriate for pt's clinical situation. Dermatologic: No rashes or ulcers noted.  No cellulitis or open wounds. Lymph : No Cervical, Axillary, or Inguinal lymphadenopathy.      CBC Lab Results  Component Value Date   WBC 5.0 05/07/2016   HGB 8.4 (L) 05/07/2016   HCT 25.2 (L) 05/07/2016   MCV 74.3 (L) 05/07/2016   PLT 213 05/07/2016    BMET    Component Value Date/Time   NA 129 (L) 05/07/2016 0517   K 4.8 05/07/2016 0517   CL 96 (L) 05/07/2016 0517   CO2 26 05/07/2016 0517   GLUCOSE 95 05/07/2016 0517   BUN 47 (H) 05/07/2016 0517   CREATININE 1.65 (H) 05/07/2016 0517   CALCIUM 10.8 (H) 05/07/2016 0517   CALCIUM 11.2 (H) 04/30/2016 0425   GFRNONAA 39 (L) 05/07/2016 0517   GFRAA 45 (L) 05/07/2016 0517   Estimated Creatinine Clearance: 30.8 mL/min (by C-G formula based on SCr of 1.65 mg/dL (H)).  COAG Lab Results  Component Value Date   INR 1.16 05/06/2016   INR 1.20 04/29/2016   INR 1.17 04/28/2016    Radiology Dg Chest 2 View  Result Date: 04/28/2016 CLINICAL DATA:  Right-sided back pain.  Status post thoracentesis. EXAM: CHEST  2 VIEW COMPARISON:  Chest radiograph 04/26/2016 FINDINGS: There is a small right apical pneumothorax. Measures approximately 9 mm. Cardiomediastinal contours are unchanged. There are diffuse interstitial pulmonary markings. No overt  pulmonary edema. No focal airspace consolidation. No pleural effusion. IMPRESSION: Small superior right pneumothorax. Electronically Signed   By: Ulyses Jarred M.D.   On: 04/28/2016 22:05   Nm Pet Image Initial (pi) Skull Base To Thigh  Addendum Date: 04/28/2016   ADDENDUM REPORT: 04/28/2016 17:48 ADDENDUM: Critical Value/emergent results were called by telephone at the time of interpretation on 04/28/2016 at 5:48 pm to Dr. Nolon Stalls , who verbally acknowledged these results. Electronically Signed   By: Suzy Bouchard M.D.   On: 04/28/2016 17:48    Result Date: 04/28/2016 CLINICAL DATA:  Initial Treatment strategy for abdominal lymphadenopathy. EXAM: NUCLEAR MEDICINE PET SKULL BASE TO THIGH TECHNIQUE: 12.5 mCi F-18 FDG was injected intravenously. Full-ring PET imaging was performed from the skull base to thigh after the radiotracer. CT data was obtained and used for attenuation correction and anatomic localization. FASTING BLOOD GLUCOSE:  Value: 106 mg/dl COMPARISON:  None. FINDINGS: NECK No hypermetabolic lymph nodes in the neck. CHEST Hypermetabolic small RIGHT supraclavicular lymph node with SUV max of 5.3. Small hypermetabolic RIGHT lower paratracheal lymph node. No hypermetabolic pulmonary nodules. There is a moderate size pneumothorax on the RIGHT occupying approximately 20% of lung volume. This pneumothorax is anterior in this supine patient. No suspicious nodularity. There is airspace disease in the LEFT suggesting pneumonia or aspiration pneumonitis. There is without metabolic activity associated this airspace disease. ABDOMEN/PELVIS There are multiple discrete hypermetabolic liver metastasis. Multiple RIGHT hepatic lobe lesions. Example lesion with SUV max equals 7.9. There is bulky hypermetabolic adenopathy in the gastrohepatic ligament with SUV max equal 10.1. Individual lymph nodes measure up to 2.5 cm short axis. There is bulky periaortic lymph node. Necrotic lymph node LEFT aorta at the level of the atrophic LEFT kidney measures 5.0 cm with SUV max equal 10.7. Lymph noted in the aorta caval position measuring 3.2 cm with SUV max 11.4. Adenopathy extends to the iliac vessels. There is bulky mesenteric adenopathy in the lower abdomen with SUV max equal 9.5 inches (image sequencing 5, series 4. There is a loop of bowel with abnormal hypermetabolic activity in the suprapubic location with SUV max equal 8.8. This concerning for involvement of the small bowel with this metastatic process. Transplant kidney in the RIGHT iliac wing. Probable small  splenic metastasis. SKELETON There is a focus of hypermetabolic activity in the LEFT sacrum with SUV max equal 12.1. There is a lytic lesion with soft tissue expansion measuring 3.1 cm at this level (26, series 4). This lesion would be amenable to biopsy. Additional metastatic lesions in the sacrum on the RIGHT and within the RIGHT femur neck. Additional hypermetabolic lesions within the lower thoracic spine and cervical spine are. IMPRESSION: 1. Widespread metastatic pattern. Bulky intensely hypermetabolic periaortic upper abdominal and mesenteric adenopathy is concerning for but not specific for high-grade lymphoma. 2. Hypermetabolic liver metastasis. 3. Hypermetabolic lesion involving the small bowel of the upper pelvis. 4. Multiple sites of hypermetabolic skeletal metastasis. Patient may be a risk for pathologic fracture of the RIGHT femoral neck. 5. Potential biopsy site in the LEFT sacrum with soft tissue lesion. 6. Small hypermetabolic RIGHT supraclavicular lymph node would also be amenable to biopsy 7. Moderate volume RIGHT pneumothorax. Potential LEFT lower lobe pneumonia or aspiration pneumonitis. Critical Value/emergent results were called initially attempted at the time of interpretation on 04/28/2016 at 4:56 pm to Dr. Sande Rives, who is covering for Dr. Mike Gip pager. Electronically Signed: By: Suzy Bouchard M.D. On: 04/28/2016 17:04   Ct Biopsy  Result Date:  04/30/2016 INDICATION: Known left periaortic lymphadenopathy with evidence of hepatic and bony metastatic disease EXAM: CT BIOPSY MEDICATIONS: None. ANESTHESIA/SEDATION: None FLUOROSCOPY TIME:  None COMPLICATIONS: None immediate. PROCEDURE: Informed written consent was obtained from the patient after a thorough discussion of the procedural risks, benefits and alternatives. All questions were addressed. Maximal Sterile Barrier Technique was utilized including caps, mask, sterile gowns, sterile gloves, sterile drape, hand hygiene and skin  antiseptic. A timeout was performed prior to the initiation of the procedure. Initial scanning again demonstrates a large mantle of lymphadenopathy in the retroperitoneum with accessible areas adjacent to the left kidney. Utilizing 1% lidocaine as a local anesthetic and CT fluoroscopic guidance, a 17 gauge guiding needle was placed adjacent to the posterior aspect of the lymphadenopathy. Multiple 18 gauge core biopsies were then obtained and sent for pathologic evaluation. Patient tolerated procedure well. The needle was removed and hemostasis obtained at the puncture site. The patient was returned his room in satisfactory condition. IMPRESSION: Successful CT-guided biopsy of left periaortic lymphadenopathy. Electronically Signed   By: Inez Catalina M.D.   On: 04/30/2016 15:15   Dg Chest Port 1 View  Result Date: 04/30/2016 CLINICAL DATA:  Follow-up right-sided pneumothorax. EXAM: PORTABLE CHEST 1 VIEW COMPARISON:  Portable chest x-ray of April 29, 2016 FINDINGS: There is a stable 5-10% right apical pneumothorax. The right lung remains hypoinflated with apparent elevation of the hemidiaphragm. The left lung is better inflated. The interstitial markings in both lungs are coarse. The cardiac silhouette is enlarged. The central pulmonary vascularity is prominent. There is calcification in the wall of the aortic arch. There are degenerative changes of the right shoulder. IMPRESSION: Stable appearance of the 5-10% right-sided pneumothorax. Stable appearance of the chest otherwise as well peer Electronically Signed   By: David  Martinique M.D.   On: 04/30/2016 08:58   Dg Chest Port 1 View  Result Date: 04/29/2016 CLINICAL DATA:  Follow-up right-sided pneumothorax. History of pulmonary hypertension. Patient is undergoing workup for lymphadenopathy, liver and bony lesions. EXAM: PORTABLE CHEST 1 VIEW COMPARISON:  PA and lateral chest x-ray of April 28, 2016 FINDINGS: An approximately 5-10% right apical pneumothorax  persists. The lungs are reasonably well inflated. There is no pleural effusion. No alveolar infiltrate or pulmonary mass is observed. The heart is top-normal in size. The pulmonary vascularity is not clearly engorged. There is calcification in the wall of the aortic arch. There is multilevel degenerative disc disease of the thoracic spine. There degenerative changes of the right shoulder. IMPRESSION: Stable 5-10% right apical pneumothorax. Top-normal cardiac size without pulmonary edema. Aortic atherosclerosis. Electronically Signed   By: David  Martinique M.D.   On: 04/29/2016 10:12   Dg Hip Unilat  With Pelvis 2-3 Views Right  Result Date: 05/02/2016 CLINICAL DATA:  Right hip pain radiating down the right leg. Patient has to use a cane. No history of fall. EXAM: DG HIP (WITH OR WITHOUT PELVIS) 2-3V RIGHT COMPARISON:  Of April 30, 2016 and PET-CT study of April 28, 2016. FINDINGS: The bones are osteopenic. There are known areas of lucency within the right femoral head posteriorly and along the anterior cortex of the femoral neck. These are not well demonstrated on today's plain film. There is severe narrowing of the right hip joint space. The observed portions of the bony pelvis exhibit no acute fracture. There is moderate narrowing of the left hip joint space. IMPRESSION: No definite acute pathologic fracture or other fracture is observed. There are no metastatic deposits within the right  femoral head and neck. There is severe right hip joint space loss consistent with osteoarthritis. Electronically Signed   By: David  Martinique M.D.   On: 05/02/2016 09:18   Dg Femur, Min 2 Views Right  Result Date: 04/30/2016 CLINICAL DATA:  Risk for right femur fracture. Abnormal findings on PET-CT. EXAM: RIGHT FEMUR 2 VIEWS COMPARISON:  PET-CT 07/09/2015 FINDINGS: There is subtle lucency in the right femoral neck which is compatible with the previous PET-CT findings. Joint space narrowing and degenerative changes in the  right hip joint. There is a total knee right arthroplasty. Right hip is located without a fracture. Surgical spine hardware noted in the upper pelvis. IMPRESSION: The lucent bone lesion in the right femoral neck is poorly characterized on this examination. No evidence for an acute fracture. Degenerative changes in the right hip joint. Electronically Signed   By: Markus Daft M.D.   On: 04/30/2016 12:23      Assessment/Plan 1. Lymphoma. Needs a Port-A-Cath for chemotherapy, durable venous access, lab draws, etc. we'll plan to place today. Risks and benefits discussed. 2. End-stage renal disease now status post renal transplant with renal dysfunction. No contrast used during his Port-A-Cath. Nephrology following. 3. Hypertension. Stable on outpatient medications.   Leotis Pain, MD  05/07/2016 3:13 PM    This note was created with Dragon medical transcription system.  Any error is purely unintentional

## 2016-05-07 NOTE — Op Note (Signed)
      [facility]       Operative Note  Date: 05/06/2016 - 05/07/2016  Preoperative diagnosis:  1. Lymphoma  Postoperative diagnosis:  Same as above  Procedures: #1. Ultrasound guidance for vascular access to the right internal jugular vein. #2. Fluoroscopic guidance for placement of catheter. #3. Placement of CT compatible Port-A-Cath, right internal jugular vein.  Surgeon: Leotis Pain, MD.   Anesthesia: Local with moderate conscious sedation for approximately 20 minutes using 2 mg of Versed and 100 mcg of Fentanyl  Fluoroscopy time: less than 1 minute  Contrast used: 0  Estimated blood loss: minimal  Indication for the procedure:  The patient is a 75 y.o.male with recently diagnosed lymphoma.  The patient needs a Port-A-Cath for durable venous access, chemotherapy, lab draws, and CT scans. We are asked to place this. Risks and benefits were discussed and informed consent was obtained.  Description of procedure: The patient was brought to the vascular and interventional radiology suite.  Moderate conscious sedation was administered throughout the procedure during a face to face encounter with the patient with my supervision of the RN administering medicines and monitoring the patient's vital signs, pulse oximetry, telemetry and mental status throughout from the start of the procedure until the patient was taken to the recovery room. The right neck chest and shoulder were sterilely prepped and draped, and a sterile surgical field was created. Ultrasound was used to help visualize a patent right internal jugular vein. This was then accessed under direct ultrasound guidance without difficulty with the Seldinger needle and a permanent image was recorded. A J-wire was placed. After skin nick and dilatation, the peel-away sheath was then placed over the wire. I then anesthetized an area under the clavicle approximately 1-2 fingerbreadths. A transverse incision was created  and an inferior pocket was created with electrocautery and blunt dissection. The port was then brought onto the field, placed into the pocket and secured to the chest wall with 2 Prolene sutures. The catheter was connected to the port and tunneled from the subclavicular incision to the access site. Fluoroscopic guidance was then used to cut the catheter to an appropriate length. The catheter was then placed through the peel-away sheath and the peel-away sheath was removed. The catheter tip was parked in excellent location under fluorocoscopic guidance in the cavoatrial junction. The pocket was then irrigated with antibiotic impregnated saline and the wound was closed with a running 3-0 Vicryl and a 4-0 Monocryl. The access incision was closed with a single 4-0 Monocryl. The Huber needle was used to withdraw blood and flush the port with heparinized saline. Dermabond was then placed as a dressing. The patient tolerated the procedure well and was taken to the recovery room in stable condition.   Leotis Pain 05/07/2016 3:20 PM   This note was created with Dragon Medical transcription system. Any errors in dictation are purely unintentional.

## 2016-05-07 NOTE — Progress Notes (Addendum)
Initial Nutrition Assessment  DOCUMENTATION CODES:   Severe malnutrition in context of chronic illness  INTERVENTION:   Ensure Enlive po BID, each supplement provides 350 kcal and 20 grams of protein  Discussed importance of proper nutrition and supplementation at home. Discussed importance of increasing calories and protein.   NUTRITION DIAGNOSIS:   Malnutrition (severe) related to cancer and cancer related treatments, chronic illness as evidenced by severe depletion of body fat, severe depletion of muscle mass, 21% weight loss over the past 7 months.   GOAL:   Patient will meet greater than or equal to 90% of their needs   MONITOR:   PO intake, Supplement acceptance, Weight trends  REASON FOR ASSESSMENT:   Malnutrition Screening Tool    ASSESSMENT:    Patient has history of kidney transplant in 2006 and currently with CKD stage III. Patient has one kidney which is the donor kidney. Newly diagnosed B-cell lymphoma with metastasis and tumor lysis   Met with patient in room today, patient reports reduced appetite over the past month and reports he has been losing weight for several months. Patient currently eating 50% meals. Patient states that he usually eats eggs for breakfast, and greens, corn, boiled eggs, and fish for lunch and/or dinner. Patient reports eating at least two meals a day, sometimes three. Patient's wife lives with him and cooks meals. Patient has lost 34lbs(21%) over the past 7 months. No N/V noted. Patient reports his usual body weight is 158lbs. Patient reports that he had pneumonia last year and lost a significant amount of weight then, but was maintaining weight of 158lbs up until March 2017.  Medications reviewed and include: allopurinol, heparin, MVI, pantoprazole, prednisone, sucralfate   Labs reviewed: uric acid 9.2(H), Na 129(L), Cl 96(L), BUN 47(H), Creat 1.65(H)-baseline 1.6-1.9, Ca 10.8(H), GFR 39(L), Hgb 8.4(L), Hct 25.2(L)  Nutrition-Focused  physical exam completed. Findings are severe fat depletion, severe muscle depletion, and no edema noted.     Diet Order:  Diet Heart Room service appropriate? Yes; Fluid consistency: Thin Diet NPO time specified Except for: Sips with Meds  Skin:  Reviewed, no issues  Last BM:  11/14 per patient report  Height:   Ht Readings from Last 1 Encounters:  05/06/16 5' 8"  (1.727 m)    Weight:   Wt Readings from Last 1 Encounters:  05/06/16 126 lb 3 oz (57.2 kg)    Ideal Body Weight:  70 kg  BMI:  Body mass index is 19.19 kg/m.  Estimated Nutritional Needs:   Kcal:  1700-2000kcal/day (30-35kcal/kg/day)  Protein:  80-92g/day (1.4-1.6g/kg/day)  Fluid:  2L/day (55m/kcal/day)  EDUCATION NEEDS:   Education needs addressed   CK[address], LDN

## 2016-05-07 NOTE — Progress Notes (Signed)
Pt here today for port insertion, vitals stable, will monitor vitals throughout entire procedure as well as etco2.

## 2016-05-07 NOTE — Plan of Care (Signed)
Problem: Skin Integrity: Goal: Risk for impaired skin integrity will decrease Outcome: Progressing Pink sleeve to  Rt arm due to prev  fistula  Problem: Activity: Goal: Risk for activity intolerance will decrease Outcome: Progressing Up with assist  Problem: Fluid Volume: Goal: Ability to maintain a balanced intake and output will improve Outcome: Progressing ivfs cont

## 2016-05-07 NOTE — Care Management Important Message (Signed)
Important Message  Patient Details  Name: Matthew Brown MRN: [id number] Date of Birth: 04-Oct-1940   Medicare Important Message Given:  Yes    Shelbie Ammons, RN 05/07/2016, 9:30 AM

## 2016-05-07 NOTE — Progress Notes (Signed)
[facility] [place] NAME: Matthew Brown    MR#:  [id number]  DATE OF BIRTH:  [birth year]  SUBJECTIVE: Patient is admitted because of tumor lysis syndrome. Today he feels better, .   CHIEF COMPLAINT:  No chief complaint on file.   REVIEW OF SYSTEMS:   ROS CONSTITUTIONAL: No fever, fatigue or weakness.  EYES: No blurred or double vision.  EARS, NOSE, AND THROAT: No tinnitus or ear pain.  RESPIRATORY: No cough, shortness of breath, wheezing or hemoptysis.  CARDIOVASCULAR: No chest pain, orthopnea, edema.  GASTROINTESTINAL: No nausea, vomiting, diarrhea or abdominal pain.  GENITOURINARY: No dysuria, hematuria.  ENDOCRINE: No polyuria, nocturia,  HEMATOLOGY: No anemia, easy bruising or bleeding SKIN: No rash or lesion. MUSCULOSKELETAL: No joint pain or arthritis.   NEUROLOGIC: No tingling, numbness, weakness.  PSYCHIATRY: No anxiety or depression.   DRUG ALLERGIES:  No Known Allergies  VITALS:  Blood pressure (!) 133/48, pulse 60, temperature 98 F (36.7 C), temperature source Oral, resp. rate 18, height 5' 8" (1.727 m), weight 57.2 kg (126 lb 3 oz), SpO2 95 %.  PHYSICAL EXAMINATION:  GENERAL:  75 y.o.-year-old patient lying in the bed with no acute distress.  EYES: Pupils equal, round, reactive to light and accommodation. No scleral icterus. Extraocular muscles intact.  HEENT: Head atraumatic, normocephalic. Oropharynx and nasopharynx clear.  NECK:  Supple, no jugular venous distention. No thyroid enlargement, no tenderness.  LUNGS: Normal breath sounds bilaterally, no wheezing, rales,rhonchi or crepitation. No use of accessory muscles of respiration.  CARDIOVASCULAR: S1, S2 normal. No murmurs, rubs, or gallops.  ABDOMEN: Soft, nontender, nondistended. Bowel sounds present. No organomegaly or mass.  EXTREMITIES: No pedal edema, cyanosis, or clubbing.  NEUROLOGIC: Cranial nerves II through XII are intact. Muscle strength 5/5 in  all extremities. Sensation intact. Gait not checked.  PSYCHIATRIC: The patient is alert and oriented x 3.  SKIN: No obvious rash, lesion, or ulcer.    LABORATORY PANEL:   CBC  Recent Labs Lab 05/07/16 0517  WBC 5.0  HGB 8.4*  HCT 25.2*  PLT 213   ------------------------------------------------------------------------------------------------------------------  Chemistries   Recent Labs Lab 05/07/16 0517  NA 129*  K 4.8  CL 96*  CO2 26  GLUCOSE 95  BUN 47*  CREATININE 1.65*  CALCIUM 10.8*   ------------------------------------------------------------------------------------------------------------------  Cardiac Enzymes No results for input(s): TROPONINI in the last 168 hours. ------------------------------------------------------------------------------------------------------------------  RADIOLOGY:  No results found.  EKG:   Orders placed or performed in visit on 06/21/15  . EKG 12-Lead    ASSESSMENT AND PLAN:   #85.75 year old male patient with renal transplant in 2006 baseline creatinine 1.9 followed by [facility] nephrology on Prograf, prednisone, mycophenolate comes in as direct admit  because of tumor lysis syndrome. Creatinine down from 1.93 to 1.64. Hyponatremia sodium 121 admission to date 129. Patient's calcium was 11.6 yesterday and today it is 10.8. #1 acute renal failure on CK distress 3: Hold the Myfortic, continue prednisone, tacrolimus, check tacrolimus trough levels, continue aggressive hydration, monitor renal function, urine output, volume status, followed by nephrology, #2 . Tumor lysis syndrome 'uric acid 9.2. Started on allopurinol. 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 By bone marrow biopsy. Patient has multiple sites of skeletal metastases.; Just been diagnosed with lymphoma. He is going to have a  Port-A-Cath placed by vascular today,   D/w wife All the records  are reviewed and case discussed with Care Management/Social Workerr. Management plans discussed with the patient, family and they are in agreement.  CODE STATUS: full TOTAL TIME TAKING CARE OF THIS PATIENT: 35 minutes.   POSSIBLE D/C IN 1-2 DAYS, DEPENDING ON CLINICAL CONDITION.   Epifanio Lesches M.D on 05/07/2016 at 11:52 AM  Between 7am to 6pm - Pager - [phone]  After 6pm go to [email or web address] - password [email or web address]  Office  [phone]  CC: Primary care physician; Enid Derry, MD   Note: This dictation was prepared with Dragon dictation along with smaller phrase technology. Any transcriptional errors that result from this process are unintentional.

## 2016-05-07 NOTE — Consult Note (Signed)
[facility]  CONSULT NOTE    Date: 05/07/2016                  Patient Name:  Matthew Brown  MRN: [id number]  DOB: 04-06-1941  Age / Sex: 75 y.o., male         PCP: Enid Derry, MD                 Service Requesting Consult: Dr. Posey Pronto                 Reason for Consult: Chronic Kidney Disease stage III - Transplant            History of Present Illness: Matthew Brown is a 75 y.o. black male with renal transplant 2006 with baseline creatinine of 1.6-1.9 followed by [facility] Nephrology, Dr. Suzan Nailer on prograf, myfortic and prednisone. Patient with past medical history of pulmonary hyeprtension, atrial fibrillation, hypertension, anemia, hyperlipidemia, gout, GERD, BPH, history of GI bleed , who was admitted to [facility] on 05/06/2016 for diffuse large B cell lymphoma hyponatremia hypercalcemia hyperuricemia renal transplant insufficincey  Patient's wife is at bedside. Patient was admitted to initiate chemotherapy. He has lost a significant amount of weight in last few months.   Patient was just admitted from 11/6 to 11/8 for pneumothorax. This was conservatively managed.   Patient has his myfortic held. Continues on prednsione and tacrolimus.    Medications: Outpatient medications: Prescriptions Prior to Admission  Medication Sig Dispense Refill Last Dose  . albuterol (PROAIR HFA) 108 (90 BASE) MCG/ACT inhaler Inhale into the lungs.   Taking  . allopurinol (ZYLOPRIM) 100 MG tablet Take 100 mg by mouth daily.     05/06/2016 at 0800  . finasteride (PROSCAR) 5 MG tablet Take 5 mg by mouth daily.     05/06/2016 at 0800  . furosemide (LASIX) 80 MG tablet    05/06/2016 at 0900  . hydroxypropyl methylcellulose (ISOPTO TEARS) 2.5 % ophthalmic solution Place 1 drop into both eyes as needed.    Taking  . magnesium oxide (MAG-OX) 400 (241.3 Mg) MG tablet TAKE 1 TABLET (400 MG TOTAL) BY MOUTH TWO (2) TIMES A DAY.  11 Past Week at Unknown time  . metoprolol tartrate  (LOPRESSOR) 25 MG tablet TAKE 1 TABLET (25 MG TOTAL) BY MOUTH TWO (2) TIMES A DAY.  11 05/06/2016 at 0800  . Multiple Vitamin (MULTIVITAMIN) tablet Take 1 tablet by mouth daily.     Past Week at Unknown time  . mycophenolate (MYFORTIC) 180 MG EC tablet Take 2 tablets twice a day, V42.0 kidney transplant, tx date 11/05/04, DAW1, brand medically necessary   05/06/2016 at 0800  . omeprazole (PRILOSEC) 20 MG capsule Take 20 mg by mouth daily.    05/06/2016 at 0800  . oxybutynin (DITROPAN-XL) 5 MG 24 hr tablet Take 5 mg by mouth daily.   05/06/2016 at 0800  . predniSONE (DELTASONE) 5 MG tablet Take 5 mg by mouth daily with breakfast.    05/06/2016 at 0800  . sucralfate (CARAFATE) 1 G tablet Take 1 g by mouth 4 (four) times daily.  3 Taking  . tacrolimus (PROGRAF) 1 MG capsule Take 3 mg by mouth 2 (two) times daily. Reported on 08/16/2015   05/06/2016 at 0800  . Tamsulosin HCl (FLOMAX) 0.4 MG CAPS Take 0.4 mg by mouth daily.     05/06/2016 at 0800  . HYDROcodone-acetaminophen (NORCO) 5-325 MG tablet Take 1 tablet by mouth every 4 (four) hours  as needed for moderate pain. 30 tablet 0 05/04/2016 at 0500  . lactulose (CHRONULAC) 10 GM/15ML solution Take 7.5-15 mLs (5-10 g total) by mouth daily as needed for mild constipation. (Patient not taking: Reported on 05/07/2016) 473 mL 1 Not Taking at Unknown time    Current medications: Current Facility-Administered Medications  Medication Dose Route Frequency Provider Last Rate Last Dose  . 0.9 %  sodium chloride infusion   Intravenous Continuous Dustin Flock, MD 125 mL/hr at 05/07/16 0300    . acetaminophen (TYLENOL) tablet 650 mg  650 mg Oral Q6H PRN Dustin Flock, MD       Or  . acetaminophen (TYLENOL) suppository 650 mg  650 mg Rectal Q6H PRN Dustin Flock, MD      . albuterol (PROVENTIL) (2.5 MG/3ML) 0.083% nebulizer solution 2.5 mg  2.5 mg Inhalation Q6H PRN Dustin Flock, MD      . allopurinol (ZYLOPRIM) tablet 100 mg  100 mg Oral Daily Dustin Flock, MD      . finasteride (PROSCAR) tablet 5 mg  5 mg Oral Daily Dustin Flock, MD      . heparin injection 5,000 Units  5,000 Units Subcutaneous Q8H Dustin Flock, MD   5,000 Units at 05/07/16 0605  . HYDROcodone-acetaminophen (NORCO/VICODIN) 5-325 MG per tablet 1 tablet  1 tablet Oral Q4H PRN Dustin Flock, MD      . lactulose (CHRONULAC) 10 GM/15ML solution 5-10 g  5-10 g Oral Daily PRN Dustin Flock, MD      . meclizine (ANTIVERT) tablet 25 mg  25 mg Oral TID PRN Dustin Flock, MD      . metoprolol tartrate (LOPRESSOR) tablet 25 mg  25 mg Oral BID Dustin Flock, MD   25 mg at 05/06/16 2211  . multivitamin with minerals tablet 1 tablet  1 tablet Oral Daily Dustin Flock, MD      . ondansetron (ZOFRAN) tablet 4 mg  4 mg Oral Q6H PRN Dustin Flock, MD       Or  . ondansetron (ZOFRAN) injection 4 mg  4 mg Intravenous Q6H PRN Dustin Flock, MD      . oxybutynin (DITROPAN-XL) 24 hr tablet 5 mg  5 mg Oral Daily Dustin Flock, MD      . pantoprazole (PROTONIX) EC tablet 40 mg  40 mg Oral Daily Dustin Flock, MD   40 mg at 05/06/16 2211  . polyvinyl alcohol (LIQUIFILM TEARS) 1.4 % ophthalmic solution 1 drop  1 drop Both Eyes PRN Dustin Flock, MD      . predniSONE (DELTASONE) tablet 5 mg  5 mg Oral Q breakfast Dustin Flock, MD      . sucralfate (CARAFATE) tablet 1 g  1 g Oral QID Dustin Flock, MD   1 g at 05/06/16 2211  . tacrolimus (PROGRAF) capsule 1 mg  1 mg Oral TID Dustin Flock, MD   1 mg at 05/06/16 2211  . tamsulosin (FLOMAX) capsule 0.4 mg  0.4 mg Oral Daily Dustin Flock, MD          Allergies: No Known Allergies    Past Medical History: Past Medical History:  Diagnosis Date  . Benign prostatic hypertrophy   . Chronic headache 10/19/2015  . ED (erectile dysfunction)   . End stage renal disease ([place])   . Essential hypertension   . GERD (gastroesophageal reflux disease)   . GIB (gastrointestinal bleeding)    a. [id number] s/p R colic artery embolization;  b.  02/2015 EGD: duod ulcerative mass->Bx notable for coagulative  necrosis - ? ischemia vs thrombosis-->coumadin d/c'd.  . Gout   . Hearing loss   . Hemorrhoids   . Hyperlipidemia   . Osteoarthrosis, unspecified whether generalized or localized, lower leg   . Persistent atrial fibrillation ([place])    a. CHA2DS2VASc = 3-->coumadin d/c'd 02/2015 2/2 recurrent GIB.  [name] Prostatitis   . Pulmonary hypertension    a. 10/2014 Echo: EF 60-65%, mild to mod MR, mildly dil LA, nl RV, PASP 4mmHg.  [name] Renal transplant recipient   . Ulcers of both great toes [facility])      Past Surgical History: Past Surgical History:  Procedure Laterality Date  . BACK SURGERY    . ESOPHAGOGASTRODUODENOSCOPY  03/13/15   severe esophagitis, ulcerated mass  . HERNIA REPAIR  1974  . PROSTATE ABLATION    . STOMACH SURGERY     blood vessel burst  . THROAT SURGERY    . TOTAL KNEE ARTHROPLASTY       Family History: Family History  Problem Relation Age of Onset  . Cancer Mother     throat  . Diabetes Brother   . Heart disease Brother   . Stroke Brother   . Hypertension Brother   . Diabetes Sister   . Heart disease Sister   . Hypertension Sister   . Diabetes Sister   . Diabetes Brother   . COPD Neg Hx   . Kidney disease Neg Hx   . Prostate cancer Neg Hx      Social History: Social History   Social History  . Marital status: Divorced    Spouse name: N/A  . Number of children: N/A  . Years of education: N/A   Occupational History  . Retired Retired   Social History Main Topics  . Smoking status: Former Smoker    Packs/day: 1.00    Years: 25.00    Types: Cigarettes    Quit date: 06/23/1978  . Smokeless tobacco: Never Used  . Alcohol use No  . Drug use: No  . Sexual activity: Yes   Other Topics Concern  . Not on file   Social History Narrative   Divorced   Does not get regular exercise     Review of Systems: Review of Systems  Constitutional: Positive for malaise/fatigue and weight loss.  Negative for chills, diaphoresis and fever.  HENT: Positive for hearing loss. Negative for congestion, ear discharge, ear pain, nosebleeds, sinus pain, sore throat and tinnitus.   Eyes: Negative for blurred vision, double vision, photophobia, pain, discharge and redness.  Respiratory: Negative.  Negative for cough, hemoptysis, sputum production, shortness of breath, wheezing and stridor.   Cardiovascular: Negative for chest pain, palpitations, orthopnea, claudication, leg swelling and PND.  Gastrointestinal: Positive for heartburn. Negative for abdominal pain, blood in stool, constipation, diarrhea, melena, nausea and vomiting.  Genitourinary: Negative.  Negative for dysuria, flank pain, frequency, hematuria and urgency.  Musculoskeletal: Negative.  Negative for back pain, falls, joint pain, myalgias and neck pain.  Skin: Negative.   Neurological: Positive for weakness. Negative for dizziness, tingling, tremors, sensory change, speech change, focal weakness, seizures, loss of consciousness and headaches.  Endo/Heme/Allergies: Negative for environmental allergies and polydipsia. Does not bruise/bleed easily.  Psychiatric/Behavioral: Negative.  Negative for depression, hallucinations, memory loss, substance abuse and suicidal ideas. The patient is not nervous/anxious and does not have insomnia.     Vital Signs: Blood pressure (!) 133/48, pulse 60, temperature 98 F (36.7 C), temperature source Oral, resp. rate 18, height 5\' 8"  (1.727 m),  weight 57.2 kg (126 lb 3 oz), SpO2 95 %.  Weight trends: Filed Weights   05/06/16 1753 05/06/16 2200  Weight: 57.2 kg (126 lb 3 oz) 57.2 kg (126 lb 3 oz)    Physical Exam: General: NAD, cachectic, laying in bed  Head: Normocephalic, atraumatic. Moist oral mucosal membranes  Eyes: Anicteric, PERRL  Neck: Supple, trachea midline  Lungs:  Clear to auscultation  Heart: Regular rate and rhythm  Abdomen:  Soft, nontender, right lower quadrant nontender  allograft kidney  Extremities: no peripheral edema.  Neurologic: Nonfocal, moving all four extremities  Skin: No lesions  Access: Right forearm AVF +bruit and +thrill     Lab results: Basic Metabolic Panel:  Recent Labs Lab 05/06/16 1147 05/07/16 0517  NA 125* 129*  K 4.8 4.8  CL 88* 96*  CO2 27 26  GLUCOSE 133* 95  BUN 57* 47*  CREATININE 1.88* 1.65*  CALCIUM 11.6* 10.8*    Liver Function Tests: No results for input(s): AST, ALT, ALKPHOS, BILITOT, PROT, ALBUMIN in the last 168 hours. No results for input(s): LIPASE, AMYLASE in the last 168 hours. No results for input(s): AMMONIA in the last 168 hours.  CBC:  Recent Labs Lab 05/06/16 1147 05/07/16 0517  WBC 6.5 5.0  NEUTROABS 5.7  --   HGB 9.3* 8.4*  HCT 27.9* 25.2*  MCV 73.5* 74.3*  PLT 248 213    Cardiac Enzymes: No results for input(s): CKTOTAL, CKMB, CKMBINDEX, TROPONINI in the last 168 hours.  BNP: Invalid input(s): POCBNP  CBG: No results for input(s): GLUCAP in the last 168 hours.  Microbiology: Results for orders placed or performed in visit on 10/09/15  Microscopic Examination     Status: Abnormal   Collection Time: 10/09/15  8:49 AM  Result Value Ref Range Status   WBC, UA 6-10 (A) 0 - 5 /hpf Final   RBC, UA 0-2 0 - 2 /hpf Final   Epithelial Cells (non renal) 0-10 0 - 10 /hpf Final   Bacteria, UA None seen None seen/Few Final  CULTURE, URINE COMPREHENSIVE     Status: Abnormal   Collection Time: 10/09/15  8:49 AM  Result Value Ref Range Status   Urine Culture, Comprehensive Final report (A)  Final   Result 1 Comment (A)  Final    Comment: Vancomycin-resistant Enterococcus (Enterococcus faecalis) 400 Colonies/mL    ANTIMICROBIAL SUSCEPTIBILITY Comment  Final    Comment:       ** S = Susceptible; I = Intermediate; R = Resistant **                    P = Positive; N = Negative             MICS are expressed in micrograms per mL    Antibiotic                 RSLT#1    RSLT#2    RSLT#3     RSLT#4 Ciprofloxacin                  R Levofloxacin                   R Linezolid                      S Nitrofurantoin                 S Penicillin  S Tetracycline                   R Vancomycin                     R   GC/Chlamydia Probe Amp     Status: None   Collection Time: 10/09/15  8:53 AM  Result Value Ref Range Status   Chlamydia trachomatis, NAA Negative Negative Final   Neisseria gonorrhoeae by PCR Negative Negative Final    Coagulation Studies:  Recent Labs  05/06/16 1147  LABPROT 14.9  INR 1.16    Urinalysis: No results for input(s): COLORURINE, LABSPEC, PHURINE, GLUCOSEU, HGBUR, BILIRUBINUR, KETONESUR, PROTEINUR, UROBILINOGEN, NITRITE, LEUKOCYTESUR in the last 72 hours.  Invalid input(s): APPERANCEUR    Imaging:  No results found.   Assessment & Plan: Matthew Brown is a 75 y.o. black male with renal transplant 2006 with baseline creatinine of 1.6-1.9 followed by [facility] Nephrology, Dr. Suzan Nailer on prograf, myfortic and prednisone. Patient with past medical history of pulmonary hyeprtension, atrial fibrillation, hypertension, anemia, hyperlipidemia, gout, GERD, BPH, history of GI bleed , who was admitted to [facility] on 05/06/2016 for diffuse large B cell lymphoma with hyponatremia, hypercalcemia, hyperuricemia And chronic kidney disease on renal transplant.   1. Acute renal failure on chronic kidney disease stage IIIT: renal transplant in 2006 with baseline creatinine of 1.6-1.9. Holding myfortic. Continues on tacrolimus and prednisone.  - Check tacrolimus trough level.  - Continue aggressive IV fluids: NS at 184mL/hr - Monitor renal function, urine output and volume status.   2. Hyponatremia: consistent with hypovolemic hyponatremia - Continue IV fluids.   3. Hypercalemia with hyperparathyroidism: consistent with primary but with renal insufficiency. Secondary to malignancy.  - Continue IV fluids - Low threshold to start cinacalcet  4. Tumor  lysis syndrome: uric acid 9.2 - started on allopurinol - Appreciate hematology input.   LOS: [place], [place] 11/15/20179:11 AM

## 2016-05-07 NOTE — Care Management (Signed)
Admitted to [facility] with the diagnosis of tumor lysis. Lives with spouse, Erskine Squibb x 31 years. Discharged from this facility 04/30/16. Last seen Dr, Sanda Klein at [facility] 3 weeks ago, next appointment is 05/13/16. Takes care of all basic activities of daily living himself, drives. Home Health last year. Thinks it could have been [place]. [place] last year. No home oxygen. Cane, rolling walker, and bedside commode in the home. No falls. Lost 30 lbs since August. Presciptions are filled at [facility] in [place]. [facility] in the past. Shelbie Ammons RN MSN CCM Care Management [phone]

## 2016-05-08 ENCOUNTER — Ambulatory Visit: Payer: Commercial Managed Care - HMO

## 2016-05-08 ENCOUNTER — Inpatient Hospital Stay: Payer: Commercial Managed Care - HMO

## 2016-05-08 ENCOUNTER — Other Ambulatory Visit: Payer: Commercial Managed Care - HMO

## 2016-05-08 ENCOUNTER — Encounter: Payer: Self-pay | Admitting: Hematology and Oncology

## 2016-05-08 ENCOUNTER — Encounter: Payer: Self-pay | Admitting: Vascular Surgery

## 2016-05-08 ENCOUNTER — Other Ambulatory Visit: Payer: Self-pay

## 2016-05-08 DIAGNOSIS — Z79899 Other long term (current) drug therapy: Secondary | ICD-10-CM

## 2016-05-08 DIAGNOSIS — D47Z1 Post-transplant lymphoproliferative disorder (PTLD): Secondary | ICD-10-CM

## 2016-05-08 DIAGNOSIS — Z7901 Long term (current) use of anticoagulants: Secondary | ICD-10-CM

## 2016-05-08 DIAGNOSIS — E43 Unspecified severe protein-calorie malnutrition: Secondary | ICD-10-CM | POA: Insufficient documentation

## 2016-05-08 DIAGNOSIS — E883 Tumor lysis syndrome: Principal | ICD-10-CM

## 2016-05-08 DIAGNOSIS — C8330 Diffuse large B-cell lymphoma, unspecified site: Secondary | ICD-10-CM

## 2016-05-08 DIAGNOSIS — D638 Anemia in other chronic diseases classified elsewhere: Secondary | ICD-10-CM

## 2016-05-08 DIAGNOSIS — T8610 Unspecified complication of kidney transplant: Secondary | ICD-10-CM

## 2016-05-08 LAB — CBC
HEMATOCRIT: 26.5 % — AB (ref 40.0–52.0)
Hemoglobin: 8.7 g/dL — ABNORMAL LOW (ref 13.0–18.0)
MCH: 24.2 pg — ABNORMAL LOW (ref 26.0–34.0)
MCHC: 32.7 g/dL (ref 32.0–36.0)
MCV: 73.9 fL — AB (ref 80.0–100.0)
PLATELETS: 224 10*3/uL (ref 150–440)
RBC: 3.59 MIL/uL — ABNORMAL LOW (ref 4.40–5.90)
RDW: 19.2 % — AB (ref 11.5–14.5)
WBC: 4.3 10*3/uL (ref 3.8–10.6)

## 2016-05-08 LAB — RENAL FUNCTION PANEL
Albumin: 2.6 g/dL — ABNORMAL LOW (ref 3.5–5.0)
Anion gap: 5 (ref 5–15)
BUN: 43 mg/dL — AB (ref 6–20)
CHLORIDE: 104 mmol/L (ref 101–111)
CO2: 25 mmol/L (ref 22–32)
CREATININE: 1.47 mg/dL — AB (ref 0.61–1.24)
Calcium: 10.3 mg/dL (ref 8.9–10.3)
GFR, EST AFRICAN AMERICAN: 52 mL/min — AB (ref >60)
GFR, EST NON AFRICAN AMERICAN: 45 mL/min — AB (ref >60)
Glucose, Bld: 100 mg/dL — ABNORMAL HIGH (ref 65–99)
Phosphorus: 2.5 mg/dL (ref 2.5–4.6)
Potassium: 4.7 mmol/L (ref 3.5–5.1)
SODIUM: 134 mmol/L — AB (ref 135–145)

## 2016-05-08 LAB — DIFFERENTIAL
Basophils Absolute: 0 10*3/uL (ref 0–0.1)
Basophils Relative: 0 %
EOS ABS: 0 10*3/uL (ref 0–0.7)
EOS PCT: 0 %
LYMPHS ABS: 0.3 10*3/uL — AB (ref 1.0–3.6)
LYMPHS PCT: 7 %
MONO ABS: 0.4 10*3/uL (ref 0.2–1.0)
Monocytes Relative: 10 %
Neutro Abs: 3.6 10*3/uL (ref 1.4–6.5)
Neutrophils Relative %: 83 %

## 2016-05-08 LAB — SURGICAL PATHOLOGY

## 2016-05-08 LAB — APTT: APTT: 38 s — AB (ref 24–36)

## 2016-05-08 LAB — PROTIME-INR
INR: 1.04
Prothrombin Time: 13.6 seconds (ref 11.4–15.2)

## 2016-05-08 MED ORDER — FENTANYL CITRATE (PF) 100 MCG/2ML IJ SOLN
INTRAMUSCULAR | Status: AC | PRN
  Administered 2016-05-08: 50 ug via INTRAVENOUS
  Administered 2016-05-08: 25 ug via INTRAVENOUS

## 2016-05-08 MED ORDER — MIDAZOLAM HCL 5 MG/5ML IJ SOLN
INTRAMUSCULAR | Status: AC | PRN
  Administered 2016-05-08: 0.5 mg via INTRAVENOUS
  Administered 2016-05-08: 1 mg via INTRAVENOUS
  Administered 2016-05-08: 0.5 mg via INTRAVENOUS

## 2016-05-08 MED ORDER — ENOXAPARIN SODIUM 40 MG/0.4ML ~~LOC~~ SOLN
40.0000 mg | SUBCUTANEOUS | Status: DC
  Administered 2016-05-09 – 2016-05-13 (×5): 40 mg via SUBCUTANEOUS
  Filled 2016-05-08 (×5): qty 0.4

## 2016-05-08 MED ORDER — MIDAZOLAM HCL 5 MG/5ML IJ SOLN
INTRAMUSCULAR | Status: AC
  Filled 2016-05-08: qty 5

## 2016-05-08 MED ORDER — POLYVINYL ALCOHOL 1.4 % OP SOLN
1.0000 [drp] | OPHTHALMIC | Status: DC | PRN
  Administered 2016-05-09: 1 [drp] via OPHTHALMIC
  Filled 2016-05-08 (×3): qty 15

## 2016-05-08 MED ORDER — HEPARIN SOD (PORK) LOCK FLUSH 100 UNIT/ML IV SOLN
INTRAVENOUS | Status: AC
  Filled 2016-05-08: qty 5

## 2016-05-08 MED ORDER — FENTANYL CITRATE (PF) 100 MCG/2ML IJ SOLN
INTRAMUSCULAR | Status: AC
  Administered 2016-05-08: 11:00:00
  Filled 2016-05-08: qty 4

## 2016-05-08 MED ORDER — HEPARIN SODIUM (PORCINE) 5000 UNIT/ML IJ SOLN: 5000.0000 [IU] | Freq: Three times a day (TID) | INTRAMUSCULAR | Status: DC

## 2016-05-08 NOTE — Procedures (Signed)
Under CT guidance, bone marrow aspiration and core biopsy of right iliac bone was performed. No immediate complications.

## 2016-05-08 NOTE — Progress Notes (Signed)
[facility] [place] NAME: Matthew Brown    MR#:  [id number]  DATE OF BIRTH:  1940/09/05  SUBJECTIVE: Patient is admitted because of tumor lysis syndrome. Had bone marrow biopsy,today,.   CHIEF COMPLAINT:  No chief complaint on file.   REVIEW OF SYSTEMS:   ROS CONSTITUTIONAL: No fever, fatigue or weakness.  EYES: No blurred or double vision.  EARS, NOSE, AND THROAT: No tinnitus or ear pain.  RESPIRATORY: No cough, shortness of breath, wheezing or hemoptysis.  CARDIOVASCULAR: No chest pain, orthopnea, edema.  GASTROINTESTINAL: No nausea, vomiting, diarrhea or abdominal pain.  GENITOURINARY: No dysuria, hematuria.  ENDOCRINE: No polyuria, nocturia,  HEMATOLOGY: No anemia, easy bruising or bleeding SKIN: No rash or lesion. MUSCULOSKELETAL: No joint pain or arthritis.   NEUROLOGIC: No tingling, numbness, weakness.  PSYCHIATRY: No anxiety or depression.   DRUG ALLERGIES:  No Known Allergies  VITALS:  Blood pressure (!) 121/42, pulse (!) 56, temperature 98.7 F (37.1 C), resp. rate 18, height 5' 8"  (1.727 m), weight 57.2 kg (126 lb 3 oz), SpO2 92 %.  PHYSICAL EXAMINATION:  GENERAL:  75 y.o.-year-old patient lying in the bed with no acute distress.  EYES: Pupils equal, round, reactive to light and accommodation. No scleral icterus. Extraocular muscles intact.  HEENT: Head atraumatic, normocephalic. Oropharynx and nasopharynx clear.  NECK:  Supple, no jugular venous distention. No thyroid enlargement, no tenderness.  LUNGS: Normal breath sounds bilaterally, no wheezing, rales,rhonchi or crepitation. No use of accessory muscles of respiration.  CARDIOVASCULAR: S1, S2 normal. No murmurs, rubs, or gallops.  ABDOMEN: Soft, nontender, nondistended. Bowel sounds present. No organomegaly or mass.  EXTREMITIES: No pedal edema, cyanosis, or clubbing.  NEUROLOGIC: Cranial nerves II through XII are intact. Muscle strength 5/5 in all  extremities. Sensation intact. Gait not checked.  PSYCHIATRIC: The patient is alert and oriented x 3.  SKIN: No obvious rash, lesion, or ulcer.    LABORATORY PANEL:   CBC  Recent Labs Lab 05/08/16 0020  WBC 4.3  HGB 8.7*  HCT 26.5*  PLT 224   ------------------------------------------------------------------------------------------------------------------  Chemistries   Recent Labs Lab 05/08/16 0515  NA 134*  K 4.7  CL 104  CO2 25  GLUCOSE 100*  BUN 43*  CREATININE 1.47*  CALCIUM 10.3   ------------------------------------------------------------------------------------------------------------------  Cardiac Enzymes No results for input(s): TROPONINI in the last 168 hours. ------------------------------------------------------------------------------------------------------------------  RADIOLOGY:  Ct Biopsy  Result Date: 05/08/2016 INDICATION: Lymphoma. EXAM: CT BIOPSY MEDICATIONS: None. ANESTHESIA/SEDATION: Moderate (conscious) sedation was employed during this procedure. A total of Versed 2.0 mg and Fentanyl 75 mcg was administered intravenously. Moderate Sedation Time: 13 minutes. The patient's level of consciousness and vital signs were monitored continuously by radiology nursing throughout the procedure under my direct supervision. COMPLICATIONS: None immediate. PROCEDURE: Informed written consent was obtained from the patient after a thorough discussion of the procedural risks, benefits and alternatives. All questions were addressed. Sterile Barrier Technique was utilized including mask, sterile gloves, sterile drape, hand hygiene and skin antiseptic. A timeout was performed prior to the initiation of the procedure. Patient was placed in prone position. Posterior flank region was prepped and draped using sterile technique. Under CT guidance, lidocaine was injected into the subcutaneous tissues and subperiosteally into right iliac bone. Then, under CT guidance, trocar  was passed through posterior margin of right iliac bone. Bone marrow aspirates were obtained with and without heparin administration. Core biopsy was obtained as well. The samples were given to the pathology  technologist who was present at that time. Needle was removed and appropriate dressing was applied. IMPRESSION: Under CT guidance, percutaneous bone marrow aspiration and core biopsy of right iliac bone was performed. Electronically Signed   By: Marijo Conception, M.D.   On: 05/08/2016 13:25    EKG:   Orders placed or performed in visit on 06/21/15  . EKG 12-Lead    ASSESSMENT AND PLAN:   #57.75 year old male patient with renal transplant in 2006 baseline creatinine 1.9 followed by [facility] nephrology on Prograf, prednisone, mycophenolate comes in as direct admit  because of tumor lysis syndrome.  #1 acute renal failure on CK distress 3: Hold the Myfortic, continue prednisone, tacrolimus, check tacrolimus trough levels, continue aggressive hydration, monitor renal function, urine output, volume status, followed by nephrology,continue iv fluids one more day. , #2 . Tumor lysis syndrome 'uric acid 9.2. Started on allopurinol.  #3 hypercalcemia with hyperparathyroidism due to malignancy continue IV hydration  #4./Patient had acute on chronic back pain, has diffuse large cell B-cell large B-cell lymphoma. bone marrow biopsy. Patient has multiple sites of skeletal metastases.; Just been diagnosed with lymphoma.   Porta cath placed by vascular yesteray    All the records are reviewed and case discussed with Care Management/Social Workerr. Management plans discussed with the patient, family and they are in agreement.  CODE STATUS: full TOTAL TIME TAKING CARE OF THIS PATIENT: 35 minutes.   POSSIBLE D/C IN 1-2 DAYS, DEPENDING ON CLINICAL CONDITION.   Epifanio Lesches M.D on 05/08/2016 at 5:33 PM  Between 7am to 6pm - Pager - [phone]  After 6pm go to [email or web address] - password [email or web address]  [facility]  Office  [phone]  CC: Primary care physician; Enid Derry, MD   Note: This dictation was prepared with Dragon dictation along with smaller phrase technology. Any transcriptional errors that result from this process are unintentional.

## 2016-05-08 NOTE — Progress Notes (Signed)
Post IR procedure consult Standard bleeding risk  Pt on SQ heparin - says next dose should be at least 6 hours or next std dose interval. Will skip 1400 dose and time next dose for 2200. Informed RN of plan.

## 2016-05-08 NOTE — CV Procedure (Signed)
Procedure and risks discussed with patient. Informed consent obtained. Will perform CT-guided bone marrow biopsy. 

## 2016-05-08 NOTE — Progress Notes (Signed)
[facility]  ROUNDING NOTE   Subjective:   Bone marrow biopsy today  Creatinine 1.47 (1.65)  NS at 125  Objective:  Vital signs in last 24 hours:  Temp:  [98.3 F (36.8 C)-98.7 F (37.1 C)] 98.7 F (37.1 C) (11/16 1258) Pulse Rate:  [45-75] 56 (11/16 1259) Resp:  [12-24] 18 (11/16 1258) BP: (113-159)/(42-87) 121/42 (11/16 1258) SpO2:  [89 %-100 %] 92 % (11/16 1259)  Weight change:  Filed Weights   05/06/16 1753 05/06/16 2200  Weight: 57.2 kg (126 lb 3 oz) 57.2 kg (126 lb 3 oz)    Intake/Output: I/O last 3 completed shifts: In: 5209.1 [P.O.:360; I.V.:4849.1] Out: 6440 [Urine:1420]   Intake/Output this shift:  Total I/O In: -  Out: 200 [Urine:200]  Physical Exam: General: NAD, cachectic, laying in bed  Head: Normocephalic, atraumatic. Moist oral mucosal membranes  Eyes: Anicteric, PERRL  Neck: Supple, trachea midline  Lungs:  Clear to auscultation  Heart: Regular rate and rhythm  Abdomen:  Soft, nontender, right lower quadrant nontender allograft kidney  Extremities:  no peripheral edema.  Neurologic: Nonfocal, moving all four extremities  Skin: No lesions  Access: Right forearm AVF +bruit and +thrill    Basic Metabolic Panel:  Recent Labs Lab 05/06/16 1147 05/07/16 0517 05/08/16 0515  NA 125* 129* 134*  K 4.8 4.8 4.7  CL 88* 96* 104  CO2 27 26 25   GLUCOSE 133* 95 100*  BUN 57* 47* 43*  CREATININE 1.88* 1.65* 1.47*  CALCIUM 11.6* 10.8* 10.3  PHOS  --   --  2.5    Liver Function Tests:  Recent Labs Lab 05/08/16 0515  ALBUMIN 2.6*   No results for input(s): LIPASE, AMYLASE in the last 168 hours. No results for input(s): AMMONIA in the last 168 hours.  CBC:  Recent Labs Lab 05/06/16 1147 05/07/16 0517 05/08/16 0020  WBC 6.5 5.0 4.3  NEUTROABS 5.7  --  3.6  HGB 9.3* 8.4* 8.7*  HCT 27.9* 25.2* 26.5*  MCV 73.5* 74.3* 73.9*  PLT 248 213 224    Cardiac Enzymes: No results for input(s): CKTOTAL, CKMB, CKMBINDEX,  TROPONINI in the last 168 hours.  BNP: Invalid input(s): POCBNP  CBG: No results for input(s): GLUCAP in the last 168 hours.  Microbiology: Results for orders placed or performed in visit on 10/09/15  Microscopic Examination     Status: Abnormal   Collection Time: 10/09/15  8:49 AM  Result Value Ref Range Status   WBC, UA 6-10 (A) 0 - 5 /hpf Final   RBC, UA 0-2 0 - 2 /hpf Final   Epithelial Cells (non renal) 0-10 0 - 10 /hpf Final   Bacteria, UA None seen None seen/Few Final  CULTURE, URINE COMPREHENSIVE     Status: Abnormal   Collection Time: 10/09/15  8:49 AM  Result Value Ref Range Status   Urine Culture, Comprehensive Final report (A)  Final   Result 1 Comment (A)  Final    Comment: Vancomycin-resistant Enterococcus (Enterococcus faecalis) 400 Colonies/mL    ANTIMICROBIAL SUSCEPTIBILITY Comment  Final    Comment:       ** S = Susceptible; I = Intermediate; R = Resistant **                    P = Positive; N = Negative             MICS are expressed in micrograms per mL    Antibiotic  RSLT#1    RSLT#2    RSLT#3    RSLT#4 Ciprofloxacin                  R Levofloxacin                   R Linezolid                      S Nitrofurantoin                 S Penicillin                     S Tetracycline                   R Vancomycin                     R   GC/Chlamydia Probe Amp     Status: None   Collection Time: 10/09/15  8:53 AM  Result Value Ref Range Status   Chlamydia trachomatis, NAA Negative Negative Final   Neisseria gonorrhoeae by PCR Negative Negative Final    Coagulation Studies:  Recent Labs  05/06/16 1147 05/08/16 0020  LABPROT 14.9 13.6  INR 1.16 1.04    Urinalysis: No results for input(s): COLORURINE, LABSPEC, PHURINE, GLUCOSEU, HGBUR, BILIRUBINUR, KETONESUR, PROTEINUR, UROBILINOGEN, NITRITE, LEUKOCYTESUR in the last 72 hours.  Invalid input(s): APPERANCEUR    Imaging: Ct Biopsy  Result Date: 05/08/2016 INDICATION: Lymphoma.  EXAM: CT BIOPSY MEDICATIONS: None. ANESTHESIA/SEDATION: Moderate (conscious) sedation was employed during this procedure. A total of Versed 2.0 mg and Fentanyl 75 mcg was administered intravenously. Moderate Sedation Time: 13 minutes. The patient's level of consciousness and vital signs were monitored continuously by radiology nursing throughout the procedure under my direct supervision. COMPLICATIONS: None immediate. PROCEDURE: Informed written consent was obtained from the patient after a thorough discussion of the procedural risks, benefits and alternatives. All questions were addressed. Sterile Barrier Technique was utilized including mask, sterile gloves, sterile drape, hand hygiene and skin antiseptic. A timeout was performed prior to the initiation of the procedure. Patient was placed in prone position. Posterior flank region was prepped and draped using sterile technique. Under CT guidance, lidocaine was injected into the subcutaneous tissues and subperiosteally into right iliac bone. Then, under CT guidance, trocar was passed through posterior margin of right iliac bone. Bone marrow aspirates were obtained with and without heparin administration. Core biopsy was obtained as well. The samples were given to the pathology technologist who was present at that time. Needle was removed and appropriate dressing was applied. IMPRESSION: Under CT guidance, percutaneous bone marrow aspiration and core biopsy of right iliac bone was performed. Electronically Signed   By: Marijo Conception, M.D.   On: 05/08/2016 13:25     Medications:   . sodium chloride 125 mL/hr at 05/08/16 0946  . sodium chloride     . allopurinol  100 mg Oral Daily  . feeding supplement (ENSURE ENLIVE)  237 mL Oral TID BM  . fentaNYL      . finasteride  5 mg Oral Daily  . gentamicin irrigation   Irrigation Once  . heparin  5,000 Units Subcutaneous Q8H  . heparin lock flush      . metoprolol tartrate  25 mg Oral BID  . midazolam       . multivitamin with minerals  1 tablet Oral Daily  . oxybutynin  5 mg Oral  Daily  . pantoprazole  40 mg Oral Daily  . predniSONE  5 mg Oral Q breakfast  . sucralfate  1 g Oral QID  . tacrolimus  3 mg Oral BID  . tamsulosin  0.4 mg Oral Daily   acetaminophen **OR** acetaminophen, albuterol, HYDROcodone-acetaminophen, lactulose, ondansetron **OR** ondansetron (ZOFRAN) IV, polyvinyl alcohol  Assessment/ Plan:  [name] is a 75 y.o. black male with renal transplant 2006 with baseline creatinine of 1.6-1.9 followed by [facility] Nephrology, Dr. Suzan Nailer on prograf, myfortic and prednisone. Patient with past medical history of pulmonary hyeprtension, atrial fibrillation, hypertension, anemia, hyperlipidemia, gout, GERD, BPH, history of GI bleed , who was admitted to [facility] on 05/06/2016 for diffuse large B cell lymphoma with hyponatremia, hypercalcemia, hyperuricemia And chronic kidney disease on renal transplant.   1. Acute renal failure on chronic kidney disease stage IIIT: renal transplant in 2006 with baseline creatinine of 1.6-1.9. Holding myfortic. Continues on tacrolimus and prednisone.  - Check tacrolimus trough level.  - Continue aggressive IV fluids: NS at 125m/hr for one more day - Monitor renal function, urine output and volume status.   2. Hyponatremia: consistent with hypovolemic hyponatremia. improved - Continue IV fluids.   3. Hypercalemia with hyperparathyroidism: consistent with primary but with renal insufficiency. Secondary to malignancy. Calcium improved.  - Continue IV fluids - Low threshold to start cinacalcet  4. Tumor lysis syndrome: uric acid 9.2 on admission -  allopurinol - Appreciate hematology input.    LOS: [place] Shyquan Stallbaumer 11/16/20172:10 PM

## 2016-05-09 ENCOUNTER — Telehealth: Payer: Self-pay | Admitting: Family Medicine

## 2016-05-09 ENCOUNTER — Other Ambulatory Visit: Payer: Self-pay | Admitting: Hematology and Oncology

## 2016-05-09 DIAGNOSIS — Z7952 Long term (current) use of systemic steroids: Secondary | ICD-10-CM

## 2016-05-09 DIAGNOSIS — I6523 Occlusion and stenosis of bilateral carotid arteries: Secondary | ICD-10-CM

## 2016-05-09 LAB — COMPREHENSIVE METABOLIC PANEL
ALT: 11 U/L — ABNORMAL LOW (ref 17–63)
AST: 27 U/L (ref 15–41)
Albumin: 2.6 g/dL — ABNORMAL LOW (ref 3.5–5.0)
Alkaline Phosphatase: 120 U/L (ref 38–126)
Anion gap: 3 — ABNORMAL LOW (ref 5–15)
BUN: 32 mg/dL — ABNORMAL HIGH (ref 6–20)
CO2: 24 mmol/L (ref 22–32)
Calcium: 10.6 mg/dL — ABNORMAL HIGH (ref 8.9–10.3)
Chloride: 111 mmol/L (ref 101–111)
Creatinine, Ser: 1.42 mg/dL — ABNORMAL HIGH (ref 0.61–1.24)
GFR calc Af Amer: 54 mL/min — ABNORMAL LOW (ref >60)
GFR calc non Af Amer: 46 mL/min — ABNORMAL LOW (ref >60)
Glucose, Bld: 95 mg/dL (ref 65–99)
Potassium: 4.8 mmol/L (ref 3.5–5.1)
Sodium: 138 mmol/L (ref 135–145)
Total Bilirubin: 0.6 mg/dL (ref 0.3–1.2)
Total Protein: 5.3 g/dL — ABNORMAL LOW (ref 6.5–8.1)

## 2016-05-09 LAB — CBC WITH DIFFERENTIAL/PLATELET
Basophils Absolute: 0 10*3/uL (ref 0–0.1)
Basophils Relative: 1 %
Eosinophils Absolute: 0.1 10*3/uL (ref 0–0.7)
Eosinophils Relative: 1 %
HCT: 25.7 % — ABNORMAL LOW (ref 40.0–52.0)
Hemoglobin: 8.3 g/dL — ABNORMAL LOW (ref 13.0–18.0)
Lymphocytes Relative: 6 %
Lymphs Abs: 0.3 10*3/uL — ABNORMAL LOW (ref 1.0–3.6)
MCH: 24.1 pg — ABNORMAL LOW (ref 26.0–34.0)
MCHC: 32.3 g/dL (ref 32.0–36.0)
MCV: 74.5 fL — ABNORMAL LOW (ref 80.0–100.0)
Monocytes Absolute: 0.5 10*3/uL (ref 0.2–1.0)
Monocytes Relative: 11 %
Neutro Abs: 4.2 10*3/uL (ref 1.4–6.5)
Neutrophils Relative %: 81 %
Platelets: 208 10*3/uL (ref 150–440)
RBC: 3.45 MIL/uL — ABNORMAL LOW (ref 4.40–5.90)
RDW: 19.5 % — ABNORMAL HIGH (ref 11.5–14.5)
WBC: 5.1 10*3/uL (ref 3.8–10.6)

## 2016-05-09 LAB — TACROLIMUS LEVEL: TACROLIMUS (FK506) - LABCORP: 5.5 ng/mL (ref 2.0–20.0)

## 2016-05-09 LAB — URIC ACID: Uric Acid, Serum: 7.2 mg/dL (ref 4.4–7.6)

## 2016-05-09 LAB — PHOSPHORUS: Phosphorus: 2 mg/dL — ABNORMAL LOW (ref 2.5–4.6)

## 2016-05-09 LAB — LACTATE DEHYDROGENASE: LDH: 416 U/L — ABNORMAL HIGH (ref 98–192)

## 2016-05-09 MED ORDER — SODIUM CHLORIDE 0.9 % IV SOLN
400.0000 mg/m2 | Freq: Once | INTRAVENOUS | Status: DC
  Filled 2016-05-09: qty 33

## 2016-05-09 MED ORDER — DIPHENHYDRAMINE HCL 25 MG PO CAPS
50.0000 mg | ORAL_CAPSULE | Freq: Once | ORAL | Status: AC
  Administered 2016-05-09: 50 mg via ORAL
  Filled 2016-05-09: qty 2

## 2016-05-09 MED ORDER — SODIUM CHLORIDE 0.9 % IV SOLN
375.0000 mg/m2 | Freq: Once | INTRAVENOUS | Status: AC
  Administered 2016-05-09: 15:00:00 600 mg via INTRAVENOUS
  Filled 2016-05-09: qty 60

## 2016-05-09 MED ORDER — SODIUM CHLORIDE 0.9 % IV SOLN
10.0000 mg | Freq: Once | INTRAVENOUS | Status: AC
  Administered 2016-05-09: 10 mg via INTRAVENOUS
  Filled 2016-05-09: qty 1

## 2016-05-09 MED ORDER — PREDNISONE 5 MG PO TABS
70.0000 mg | ORAL_TABLET | Freq: Every day | ORAL | Status: DC
  Administered 2016-05-10 – 2016-05-13 (×4): 70 mg via ORAL
  Filled 2016-05-09 (×4): qty 2

## 2016-05-09 MED ORDER — SODIUM CHLORIDE 0.9 % IV SOLN
400.0000 mg/m2 | Freq: Once | INTRAVENOUS | Status: AC
  Administered 2016-05-09: 660 mg via INTRAVENOUS
  Filled 2016-05-09: qty 33

## 2016-05-09 MED ORDER — ACETAMINOPHEN 325 MG PO TABS
650.0000 mg | ORAL_TABLET | Freq: Once | ORAL | Status: AC
  Administered 2016-05-09: 650 mg via ORAL
  Filled 2016-05-09: qty 2

## 2016-05-09 MED ORDER — DOCUSATE SODIUM 100 MG PO CAPS
100.0000 mg | ORAL_CAPSULE | Freq: Two times a day (BID) | ORAL | Status: DC
  Administered 2016-05-09 – 2016-05-13 (×8): 100 mg via ORAL
  Filled 2016-05-09 (×8): qty 1

## 2016-05-09 MED ORDER — PALONOSETRON HCL INJECTION 0.25 MG/5ML
0.2500 mg | Freq: Once | INTRAVENOUS | Status: AC
  Administered 2016-05-09: 12:00:00 0.25 mg via INTRAVENOUS
  Filled 2016-05-09: qty 5

## 2016-05-09 MED ORDER — DOXORUBICIN HCL CHEMO IV INJECTION 2 MG/ML
25.0000 mg/m2 | Freq: Once | INTRAVENOUS | Status: AC
  Administered 2016-05-09: 12:00:00 42 mg via INTRAVENOUS
  Filled 2016-05-09: qty 21

## 2016-05-09 MED ORDER — VINCRISTINE SULFATE CHEMO INJECTION 1 MG/ML
1.0000 mg | Freq: Once | INTRAVENOUS | Status: AC
  Administered 2016-05-09: 1 mg via INTRAVENOUS
  Filled 2016-05-09: qty 1

## 2016-05-09 NOTE — Progress Notes (Signed)
[facility]  ROUNDING NOTE   Subjective:   Na 138   Chemotherapy for today.   Objective:  Vital signs in last 24 hours:  Temp:  [97.7 F (36.5 C)-98.4 F (36.9 C)] 97.7 F (36.5 C) (11/17 1440) Pulse Rate:  [56-72] 72 (11/17 1440) Resp:  [18-20] 20 (11/17 1316) BP: (109-140)/(44-59) 131/45 (11/17 1440) SpO2:  [91 %-96 %] 91 % (11/17 1440)  Weight change:  Filed Weights   05/06/16 1753 05/06/16 2200  Weight: 57.2 kg (126 lb 3 oz) 57.2 kg (126 lb 3 oz)    Intake/Output: I/O last 3 completed shifts: In: 4778.8 [P.O.:360; I.V.:4418.8] Out: 890 [Urine:890]   Intake/Output this shift:  Total I/O In: 1502.3 [P.O.:240; I.V.:737.5; IV Piggyback:524.8] Out: 450 [Urine:450]  Physical Exam: General: NAD, cachectic, laying in bed  Head: Normocephalic, atraumatic. Moist oral mucosal membranes  Eyes: Anicteric, PERRL  Neck: Supple, trachea midline  Lungs:  Clear to auscultation  Heart: Regular rate and rhythm  Abdomen:  Soft, nontender, right lower quadrant nontender allograft kidney  Extremities:  no peripheral edema.  Neurologic: Nonfocal, moving all four extremities  Skin: No lesions  Access: Right forearm AVF +bruit and +thrill    Basic Metabolic Panel:  Recent Labs Lab 05/06/16 1147 05/07/16 0517 05/08/16 0515 05/09/16 0520  NA 125* 129* 134* 138  K 4.8 4.8 4.7 4.8  CL 88* 96* 104 111  CO2 27 26 25 24   GLUCOSE 133* 95 100* 95  BUN 57* 47* 43* 32*  CREATININE 1.88* 1.65* 1.47* 1.42*  CALCIUM 11.6* 10.8* 10.3 10.6*  PHOS  --   --  2.5 2.0*    Liver Function Tests:  Recent Labs Lab 05/08/16 0515 05/09/16 0520  AST  --  27  ALT  --  11*  ALKPHOS  --  120  BILITOT  --  0.6  PROT  --  5.3*  ALBUMIN 2.6* 2.6*   No results for input(s): LIPASE, AMYLASE in the last 168 hours. No results for input(s): AMMONIA in the last 168 hours.  CBC:  Recent Labs Lab 05/06/16 1147 05/07/16 0517 05/08/16 0020 05/09/16 0520  WBC 6.5 5.0 4.3 5.1   NEUTROABS 5.7  --  3.6 4.2  HGB 9.3* 8.4* 8.7* 8.3*  HCT 27.9* 25.2* 26.5* 25.7*  MCV 73.5* 74.3* 73.9* 74.5*  PLT 248 213 224 208    Cardiac Enzymes: No results for input(s): CKTOTAL, CKMB, CKMBINDEX, TROPONINI in the last 168 hours.  BNP: Invalid input(s): POCBNP  CBG: No results for input(s): GLUCAP in the last 168 hours.  Microbiology: Results for orders placed or performed in visit on 10/09/15  Microscopic Examination     Status: Abnormal   Collection Time: 10/09/15  8:49 AM  Result Value Ref Range Status   WBC, UA 6-10 (A) 0 - 5 /hpf Final   RBC, UA 0-2 0 - 2 /hpf Final   Epithelial Cells (non renal) 0-10 0 - 10 /hpf Final   Bacteria, UA None seen None seen/Few Final  CULTURE, URINE COMPREHENSIVE     Status: Abnormal   Collection Time: 10/09/15  8:49 AM  Result Value Ref Range Status   Urine Culture, Comprehensive Final report (A)  Final   Result 1 Comment (A)  Final    Comment: Vancomycin-resistant Enterococcus (Enterococcus faecalis) 400 Colonies/mL    ANTIMICROBIAL SUSCEPTIBILITY Comment  Final    Comment:       ** S = Susceptible; I = Intermediate; R = Resistant **  P = Positive; N = Negative             MICS are expressed in micrograms per mL    Antibiotic                 RSLT#1    RSLT#2    RSLT#3    RSLT#4 Ciprofloxacin                  R Levofloxacin                   R Linezolid                      S Nitrofurantoin                 S Penicillin                     S Tetracycline                   R Vancomycin                     R   GC/Chlamydia Probe Amp     Status: None   Collection Time: 10/09/15  8:53 AM  Result Value Ref Range Status   Chlamydia trachomatis, NAA Negative Negative Final   Neisseria gonorrhoeae by PCR Negative Negative Final    Coagulation Studies:  Recent Labs  05/08/16 0020  LABPROT 13.6  INR 1.04    Urinalysis: No results for input(s): COLORURINE, LABSPEC, PHURINE, GLUCOSEU, HGBUR, BILIRUBINUR,  KETONESUR, PROTEINUR, UROBILINOGEN, NITRITE, LEUKOCYTESUR in the last 72 hours.  Invalid input(s): APPERANCEUR    Imaging: Ct Biopsy  Result Date: 05/08/2016 INDICATION: Lymphoma. EXAM: CT BIOPSY MEDICATIONS: None. ANESTHESIA/SEDATION: Moderate (conscious) sedation was employed during this procedure. A total of Versed 2.0 mg and Fentanyl 75 mcg was administered intravenously. Moderate Sedation Time: 13 minutes. The patient's level of consciousness and vital signs were monitored continuously by radiology nursing throughout the procedure under my direct supervision. COMPLICATIONS: None immediate. PROCEDURE: Informed written consent was obtained from the patient after a thorough discussion of the procedural risks, benefits and alternatives. All questions were addressed. Sterile Barrier Technique was utilized including mask, sterile gloves, sterile drape, hand hygiene and skin antiseptic. A timeout was performed prior to the initiation of the procedure. Patient was placed in prone position. Posterior flank region was prepped and draped using sterile technique. Under CT guidance, lidocaine was injected into the subcutaneous tissues and subperiosteally into right iliac bone. Then, under CT guidance, trocar was passed through posterior margin of right iliac bone. Bone marrow aspirates were obtained with and without heparin administration. Core biopsy was obtained as well. The samples were given to the pathology technologist who was present at that time. Needle was removed and appropriate dressing was applied. IMPRESSION: Under CT guidance, percutaneous bone marrow aspiration and core biopsy of right iliac bone was performed. Electronically Signed   By: Marijo Conception, M.D.   On: 05/08/2016 13:25     Medications:   . sodium chloride 125 mL/hr at 05/09/16 0932  . sodium chloride     . allopurinol  100 mg Oral Daily  . docusate sodium  100 mg Oral BID  . enoxaparin (LOVENOX) injection  40 mg Subcutaneous  Q24H  . feeding supplement (ENSURE ENLIVE)  237 mL Oral TID BM  . finasteride  5 mg Oral Daily  . gentamicin irrigation  Irrigation Once  . metoprolol tartrate  25 mg Oral BID  . multivitamin with minerals  1 tablet Oral Daily  . oxybutynin  5 mg Oral Daily  . pantoprazole  40 mg Oral Daily  . predniSONE  5 mg Oral Q breakfast  . [START ON 05/10/2016] predniSONE  70 mg Oral Q breakfast  . sucralfate  1 g Oral QID  . tacrolimus  3 mg Oral BID  . tamsulosin  0.4 mg Oral Daily   acetaminophen **OR** acetaminophen, albuterol, HYDROcodone-acetaminophen, lactulose, ondansetron **OR** ondansetron (ZOFRAN) IV, polyvinyl alcohol, polyvinyl alcohol  Assessment/ Plan:  Mr. HICKS KRIZMAN is a 75 y.o. black male with renal transplant 2006 with baseline creatinine of 1.6-1.9 followed by [facility] Nephrology, Dr. Suzan Nailer on prograf, myfortic and prednisone. Patient with past medical history of pulmonary hyeprtension, atrial fibrillation, hypertension, anemia, hyperlipidemia, gout, GERD, BPH, history of GI bleed , who was admitted to [facility] on 05/06/2016 for diffuse large B cell lymphoma with hyponatremia, hypercalcemia, hyperuricemia And chronic kidney disease on renal transplant.   1. Acute renal failure on chronic kidney disease stage IIIT: renal transplant in 2006 with baseline creatinine of 1.6-1.9. Holding myfortic. Continues on tacrolimus and prednisone.  - Pending tacrolimus trough level.  - Continue IV fluids - Monitor renal function, urine output and volume status.   2. Hyponatremia: consistent with hypovolemic hyponatremia. improved  3. Hypercalemia with hyperparathyroidism: consistent with primary but with renal insufficiency. Secondary to malignancy. Calcium improved.  - Continue IV fluids - Low threshold to start cinacalcet  4. Tumor lysis syndrome: uric acid 9.2 on admission. G6PD negative.  -  allopurinol - Appreciate hematology input.    LOS: McNeal, Ercia Crisafulli 11/17/20173:32 PM

## 2016-05-09 NOTE — Assessment & Plan Note (Signed)
Ordering carotid US for late Nov, early Dec

## 2016-05-09 NOTE — Telephone Encounter (Signed)
Ordering 6 month carotid imaging; due late Nov

## 2016-05-09 NOTE — Progress Notes (Signed)
[facility] Hematology/Oncology Progress Note  Date of admission: 05/06/2016  Hospital day:  05/08/2016  Chief Complaint: Matthew Brown is a 75 y.o. male with post transplant lymphoproliferative disorder (PTLD), stage IVB diffuse large B cell lymphoma who was admitted with tumor lysis syndrome.  Subjective:  Feeling better today.  Social History: The patient is accompanied by his wife today.  Allergies: No Known Allergies  Scheduled Medications: . allopurinol  100 mg Oral Daily  . enoxaparin (LOVENOX) injection  40 mg Subcutaneous Q24H  . feeding supplement (ENSURE ENLIVE)  237 mL Oral TID BM  . finasteride  5 mg Oral Daily  . gentamicin irrigation   Irrigation Once  . metoprolol tartrate  25 mg Oral BID  . multivitamin with minerals  1 tablet Oral Daily  . oxybutynin  5 mg Oral Daily  . pantoprazole  40 mg Oral Daily  . predniSONE  5 mg Oral Q breakfast  . sucralfate  1 g Oral QID  . tacrolimus  3 mg Oral BID  . tamsulosin  0.4 mg Oral Daily    Review of Systems: GENERAL:  Feels better. No fevers or sweats.  Weight loss. PERFORMANCE STATUS (ECOG):  1 Lungs: No shortness of breath or cough. No hemoptysis. Cardiac: No chest pain, palpitations, orthopnea, or PND. GI: Poor appetite. No nausea, vomiting, diarrhea, constipation, melena or hematochezia. GU: Enlarged prostate. Right sided groin discomfort. No urgency, frequency, dysuria, or hematuria. Musculoskeletal: Back pain and left hip pain. No joint pain. No muscle tenderness. Extremities: No pain or swelling. Skin: No rashes or skin changes. Neuro: Chronic headaches. No numbness or weakness, balance or coordination issues. Endocrine: No diabetes, thyroid issues, hot flashes or night sweats. Psych: No mood changes, depression or anxiety. Pain: Port-a-cath site slightly tender. Review of systems: All other systems reviewed and found to be negative.  Physical Exam: Blood pressure (!)  121/42, pulse (!) 56, temperature 98.7 F (37.1 C), resp. rate 18, height 5' 8"  (1.727 m), weight 126 lb 3 oz (57.2 kg), SpO2 92 %.  GENERAL:Thin elderly gentleman lying comfortably on the medical unit in no acute distress. MENTAL STATUS: Alert and oriented to person, place and time. HEAD:Gray goatee. Normocephalic, atraumatic, face symmetric, no Cushingoid features. EYES:Glasses. Brown eyes. Pupils equal round and reactive to light and accomodation. No conjunctivitis or scleral icterus. HRC:BULAGTXMIW clear without lesion. Edentulous. Tonguenormal. Mucous membranes dry. RESPIRATORY:Clear to auscultationwithout rales, wheezes or rhonchi. CARDIOVASCULAR:Regular rate andrhythmwithout murmur, rub or gallop. ABDOMEN:Soft, non-tender, with active bowel sounds, and no hepatosplenomegaly. No masses.  SKIN: No rashes, ulcers or lesions. EXTREMITIES: No edema, no skin discoloration or tenderness. No palpable cords. NEUROLOGICAL: Unremarkable. PSYCH: Appropriate.   Results for orders placed or performed during the hospital encounter of 05/06/16 (from the past 48 hour(s))  CBC     Status: Abnormal   Collection Time: 05/07/16  5:17 AM  Result Value Ref Range   WBC 5.0 3.8 - 10.6 K/uL   RBC 3.39 (L) 4.40 - 5.90 MIL/uL   Hemoglobin 8.4 (L) 13.0 - 18.0 g/dL   HCT 25.2 (L) 40.0 - 52.0 %   MCV 74.3 (L) 80.0 - 100.0 fL   MCH 24.7 (L) 26.0 - 34.0 pg   MCHC 33.3 32.0 - 36.0 g/dL   RDW 19.3 (H) 11.5 - 14.5 %   Platelets 213 150 - 440 K/uL  Basic metabolic panel     Status: Abnormal   Collection Time: 05/07/16  5:17 AM  Result Value Ref Range  Sodium 129 (L) 135 - 145 mmol/L   Potassium 4.8 3.5 - 5.1 mmol/L   Chloride 96 (L) 101 - 111 mmol/L   CO2 26 22 - 32 mmol/L   Glucose, Bld 95 65 - 99 mg/dL   BUN 47 (H) 6 - 20 mg/dL   Creatinine, Ser 1.65 (H) 0.61 - 1.24 mg/dL   Calcium 10.8 (H) 8.9 - 10.3 mg/dL   GFR calc non Af Amer 39 (L) >60 mL/min   GFR calc Af Amer 45  (L) >60 mL/min    Comment: (NOTE) The eGFR has been calculated using the CKD EPI equation. This calculation has not been validated in all clinical situations. eGFR's persistently <60 mL/min signify possible Chronic Kidney Disease.    Anion gap 7 5 - 15  APTT upon arrival     Status: Abnormal   Collection Time: 05/08/16 12:20 AM  Result Value Ref Range   aPTT 38 (H) 24 - 36 seconds    Comment:        IF BASELINE aPTT IS ELEVATED, SUGGEST PATIENT RISK ASSESSMENT BE USED TO DETERMINE APPROPRIATE ANTICOAGULANT THERAPY.   CBC upon arrival     Status: Abnormal   Collection Time: 05/08/16 12:20 AM  Result Value Ref Range   WBC 4.3 3.8 - 10.6 K/uL   RBC 3.59 (L) 4.40 - 5.90 MIL/uL   Hemoglobin 8.7 (L) 13.0 - 18.0 g/dL   HCT 26.5 (L) 40.0 - 52.0 %   MCV 73.9 (L) 80.0 - 100.0 fL   MCH 24.2 (L) 26.0 - 34.0 pg   MCHC 32.7 32.0 - 36.0 g/dL   RDW 19.2 (H) 11.5 - 14.5 %   Platelets 224 150 - 440 K/uL  Protime-INR upon arrival     Status: None   Collection Time: 05/08/16 12:20 AM  Result Value Ref Range   Prothrombin Time 13.6 11.4 - 15.2 seconds   INR 1.04   Differential     Status: Abnormal   Collection Time: 05/08/16 12:20 AM  Result Value Ref Range   Neutrophils Relative % 83 %   Neutro Abs 3.6 1.4 - 6.5 K/uL   Lymphocytes Relative 7 %   Lymphs Abs 0.3 (L) 1.0 - 3.6 K/uL   Monocytes Relative 10 %   Monocytes Absolute 0.4 0.2 - 1.0 K/uL   Eosinophils Relative 0 %   Eosinophils Absolute 0.0 0 - 0.7 K/uL   Basophils Relative 0 %   Basophils Absolute 0.0 0 - 0.1 K/uL  Renal function panel     Status: Abnormal   Collection Time: 05/08/16  5:15 AM  Result Value Ref Range   Sodium 134 (L) 135 - 145 mmol/L   Potassium 4.7 3.5 - 5.1 mmol/L   Chloride 104 101 - 111 mmol/L   CO2 25 22 - 32 mmol/L   Glucose, Bld 100 (H) 65 - 99 mg/dL   BUN 43 (H) 6 - 20 mg/dL   Creatinine, Ser 1.47 (H) 0.61 - 1.24 mg/dL   Calcium 10.3 8.9 - 10.3 mg/dL   Phosphorus 2.5 2.5 - 4.6 mg/dL   Albumin 2.6  (L) 3.5 - 5.0 g/dL   GFR calc non Af Amer 45 (L) >60 mL/min   GFR calc Af Amer 52 (L) >60 mL/min    Comment: (NOTE) The eGFR has been calculated using the CKD EPI equation. This calculation has not been validated in all clinical situations. eGFR's persistently <60 mL/min signify possible Chronic Kidney Disease.    Anion gap 5 5 -  15   Ct Biopsy  Result Date: 05/08/2016 INDICATION: Lymphoma. EXAM: CT BIOPSY MEDICATIONS: None. ANESTHESIA/SEDATION: Moderate (conscious) sedation was employed during this procedure. A total of Versed 2.0 mg and Fentanyl 75 mcg was administered intravenously. Moderate Sedation Time: 13 minutes. The patient's level of consciousness and vital signs were monitored continuously by radiology nursing throughout the procedure under my direct supervision. COMPLICATIONS: None immediate. PROCEDURE: Informed written consent was obtained from the patient after a thorough discussion of the procedural risks, benefits and alternatives. All questions were addressed. Sterile Barrier Technique was utilized including mask, sterile gloves, sterile drape, hand hygiene and skin antiseptic. A timeout was performed prior to the initiation of the procedure. Patient was placed in prone position. Posterior flank region was prepped and draped using sterile technique. Under CT guidance, lidocaine was injected into the subcutaneous tissues and subperiosteally into right iliac bone. Then, under CT guidance, trocar was passed through posterior margin of right iliac bone. Bone marrow aspirates were obtained with and without heparin administration. Core biopsy was obtained as well. The samples were given to the pathology technologist who was present at that time. Needle was removed and appropriate dressing was applied. IMPRESSION: Under CT guidance, percutaneous bone marrow aspiration and core biopsy of right iliac bone was performed. Electronically Signed   By: Marijo Conception, M.D.   On: 05/08/2016 13:25     Assessment:  Matthew Brown is a 75 y.o. male with post-transplant lymphoproliferative disorder (PTLD).  He has stage IVBE diffuse large B cell lymphoma.   He is s/p renal transplant.  He has tumor lysis syndrome, improving with hydration  Plan:   1. Oncology:  Patient has had port-a-cath placed (05/07/2016) and bone marrow aspirate and biopsy (05/08/2016).  Echo on 05/06/2016 revealed an EF of 55-60%.  Patient presented at tumor board on 05/08/2016.  Discussed with patient plans for chemotherapy tomorrow.  Re-reviewed side effects of mini-RCHOP.  Nursing to provide additional bedside chemotherapy teaching tomorrow.  Discussed potential worsening of tumor lysis syndrome.  Fluids and allopurinol continue.  Hepatitis serologies negative.  G6PD assay normal (patient may receive rasburicase if needed).    2.  Hematology:  Anemia work-up on 04/15/2016 revealed an iron saturation of 6% and TIBC 241 c/w chronic disease.  Ferritin was 343.  B12 and folate were normal.  Reticulocyte count was 2% (low for level of anemia).  Bone marrow performed today to assess for lymphoma.  If patient requires blood products, they will be leukopoor and irradiated.  3.  Nephrology:  Patient is s/p renal transplant on Prograf and prednisone.  MMF was discontinued. Hydration.  Prograf trough level ordered.  Continue fluids and allopurinol.  Creatinine improved to 1.47.  Daily BMP, Phos, uric acid.  Appreciate nephrology conslt.  4.  Code status:  Discussed with patient and his wife.  Full Code.   Lequita Asal, MD  05/08/2016

## 2016-05-09 NOTE — Progress Notes (Signed)
[place] [facility] [place] NAME: Matthew Brown    MR#:  [id number]  DATE OF BIRTH:  08/28/1940  SUBJECTIVE: getting chemotherapy today. Complains of the pain  At [facility] site.   CHIEF COMPLAINT:  No chief complaint on file.   REVIEW OF SYSTEMS:   ROS CONSTITUTIONAL: No fever, fatigue or weakness.  EYES: No blurred or double vision.  EARS, NOSE, AND THROAT: No tinnitus or ear pain.  RESPIRATORY: No cough, shortness of breath, wheezing or hemoptysis.  CARDIOVASCULAR: No chest pain, orthopnea, edema.  GASTROINTESTINAL: No nausea, vomiting, diarrhea or abdominal pain.  GENITOURINARY: No dysuria, hematuria.  ENDOCRINE: No polyuria, nocturia,  HEMATOLOGY: No anemia, easy bruising or bleeding SKIN: No rash or lesion. MUSCULOSKELETAL: No joint pain or arthritis.   NEUROLOGIC: No tingling, numbness, weakness.  PSYCHIATRY: No anxiety or depression.   DRUG ALLERGIES:  No Known Allergies  VITALS:  Blood pressure (!) 140/48, pulse 62, temperature 97.9 F (36.6 C), temperature source Oral, resp. rate 18, height 5\' 8"  (1.727 m), weight 57.2 kg (126 lb 3 oz), SpO2 96 %.  PHYSICAL EXAMINATION:  GENERAL:  75 y.o.-year-old patient lying in the bed with no acute distress.  EYES: Pupils equal, round, reactive to light and accommodation. No scleral icterus. Extraocular muscles intact.  HEENT: Head atraumatic, normocephalic. Oropharynx and nasopharynx clear.  NECK:  Supple, no jugular venous distention. No thyroid enlargement, no tenderness.  LUNGS: Normal breath sounds bilaterally, no wheezing, rales,rhonchi or crepitation. No use of accessory muscles of respiration.  CARDIOVASCULAR: S1, S2 normal. No murmurs, rubs, or gallops.  ABDOMEN: Soft, nontender, nondistended. Bowel sounds present. No organomegaly or mass.  EXTREMITIES: No pedal edema, cyanosis, or clubbing.  NEUROLOGIC: Cranial nerves II through XII are intact. Muscle strength 5/5 in all  extremities. Sensation intact. Gait not checked.  PSYCHIATRIC: The patient is alert and oriented x 3.  SKIN: No obvious rash, lesion, or ulcer.    LABORATORY PANEL:   CBC  Recent Labs Lab 05/09/16 0520  WBC 5.1  HGB 8.3*  HCT 25.7*  PLT 208   ------------------------------------------------------------------------------------------------------------------  Chemistries   Recent Labs Lab 05/09/16 0520  NA 138  K 4.8  CL 111  CO2 24  GLUCOSE 95  BUN 32*  CREATININE 1.42*  CALCIUM 10.6*  AST 27  ALT 11*  ALKPHOS 120  BILITOT 0.6   ------------------------------------------------------------------------------------------------------------------  Cardiac Enzymes No results for input(s): TROPONINI in the last 168 hours. ------------------------------------------------------------------------------------------------------------------  RADIOLOGY:  Ct Biopsy  Result Date: 05/08/2016 INDICATION: Lymphoma. EXAM: CT BIOPSY MEDICATIONS: None. ANESTHESIA/SEDATION: Moderate (conscious) sedation was employed during this procedure. A total of Versed 2.0 mg and Fentanyl 75 mcg was administered intravenously. Moderate Sedation Time: 13 minutes. The patient's level of consciousness and vital signs were monitored continuously by radiology nursing throughout the procedure under my direct supervision. COMPLICATIONS: None immediate. PROCEDURE: Informed written consent was obtained from the patient after a thorough discussion of the procedural risks, benefits and alternatives. All questions were addressed. Sterile Barrier Technique was utilized including mask, sterile gloves, sterile drape, hand hygiene and skin antiseptic. A timeout was performed prior to the initiation of the procedure. Patient was placed in prone position. Posterior flank region was prepped and draped using sterile technique. Under CT guidance, lidocaine was injected into the subcutaneous tissues and subperiosteally into  right iliac bone. Then, under CT guidance, trocar was passed through posterior margin of right iliac bone. Bone marrow aspirates were obtained with and without heparin administration.  Core biopsy was obtained as well. The samples were given to the pathology technologist who was present at that time. Needle was removed and appropriate dressing was applied. IMPRESSION: Under CT guidance, percutaneous bone marrow aspiration and core biopsy of right iliac bone was performed. Electronically Signed   By: Marijo Conception, M.D.   On: 05/08/2016 13:25    EKG:   Orders placed or performed in visit on 06/21/15  . EKG 12-Lead    ASSESSMENT AND PLAN:   #76.75 year old male patient with renal transplant in 2006 baseline creatinine 1.9 followed by [facility] nephrology on Prograf, prednisone, mycophenolate comes in as direct admit  because of tumor lysis syndrome.  #1 acute renal failure on CK distress 3: Hold the Myfortic, continue prednisone, tacrolimus, check tacrolimus trough levels, continue aggressive hydration, monitor renal function, urine output, volume status, followed by nephrology,  , #2 . Tumor lysis syndrome 'uric acid 9.2. Started on allopurinol.  #3 hypercalcemia with hyperparathyroidism due to malignancy continue IV hydration  #4./Posttransplant lymphoproliferative disorder; stage IV B diffuse large B-cell lymphoma'patient had a bone marrow aspiration yesterday. Echo showed EF 55-60%. Patient to get chemotherapy with  R CHOP regimen. Continue aggressive hydration, monitor for side effects.      All the records are reviewed and case discussed with Care Management/Social Workerr. Management plans discussed with the patient, family and they are in agreement.  CODE STATUS: full TOTAL TIME TAKING CARE OF THIS PATIENT: 35 minutes.   POSSIBLE D/C IN 1-2 DAYS, DEPENDING ON CLINICAL CONDITION.   Epifanio Lesches M.D on 05/09/2016 at 11:44 AM  Between 7am to 6pm - Pager -  [phone]  After 6pm go to [email or web address] - password [email or web address] [facility]  Office  [phone]  CC: Primary care physician; Enid Derry, MD   Note: This dictation was prepared with Dragon dictation along with smaller phrase technology. Any transcriptional errors that result from this process are unintentional.

## 2016-05-10 LAB — BASIC METABOLIC PANEL
Anion gap: 3 — ABNORMAL LOW (ref 5–15)
BUN: 29 mg/dL — AB (ref 6–20)
CALCIUM: 10.7 mg/dL — AB (ref 8.9–10.3)
CO2: 21 mmol/L — ABNORMAL LOW (ref 22–32)
CREATININE: 1.26 mg/dL — AB (ref 0.61–1.24)
Chloride: 112 mmol/L — ABNORMAL HIGH (ref 101–111)
GFR calc Af Amer: >60 mL/min (ref >60)
GFR, EST NON AFRICAN AMERICAN: 54 mL/min — AB (ref >60)
GLUCOSE: 84 mg/dL (ref 65–99)
Potassium: 5.2 mmol/L — ABNORMAL HIGH (ref 3.5–5.1)
SODIUM: 136 mmol/L (ref 135–145)

## 2016-05-10 LAB — URIC ACID: Uric Acid, Serum: 7.1 mg/dL (ref 4.4–7.6)

## 2016-05-10 MED ORDER — POLYETHYLENE GLYCOL 3350 17 G PO PACK
17.0000 g | PACK | Freq: Every day | ORAL | Status: DC
  Administered 2016-05-10: 17 g via ORAL
  Filled 2016-05-10 (×2): qty 1

## 2016-05-10 MED ORDER — FUROSEMIDE 10 MG/ML IJ SOLN
INTRAMUSCULAR | Status: AC
  Administered 2016-05-10: 14:00:00
  Filled 2016-05-10: qty 4

## 2016-05-10 MED ORDER — FUROSEMIDE 10 MG/ML IJ SOLN
40.0000 mg | Freq: Once | INTRAMUSCULAR | Status: AC
Start: 2016-05-10 — End: 2016-05-10
  Administered 2016-05-10: 40 mg via INTRAVENOUS

## 2016-05-10 NOTE — Progress Notes (Signed)
[place] at [place] NAME: Matthew Brown    MR#:  [id number]  DATE OF BIRTH:  [birth year]  s/p post chemotherapy yesterday, tolerated well. Complains of mild shortness of breath today. Otherwise no other issues.   CHIEF COMPLAINT:  No chief complaint on file.   REVIEW OF SYSTEMS:   ROS CONSTITUTIONAL: No fever, fatigue or weakness.  EYES: No blurred or double vision.  EARS, NOSE, AND THROAT: No tinnitus or ear pain.  RESPIRATORY: No cough, shortness of breath, wheezing or hemoptysis.  CARDIOVASCULAR: No chest pain, orthopnea, edema.  GASTROINTESTINAL: No nausea, vomiting, diarrhea or abdominal pain.  GENITOURINARY: No dysuria, hematuria.  ENDOCRINE: No polyuria, nocturia,  HEMATOLOGY: No anemia, easy bruising or bleeding SKIN: No rash or lesion. MUSCULOSKELETAL: No joint pain or arthritis.   NEUROLOGIC: No tingling, numbness, weakness.  PSYCHIATRY: No anxiety or depression.   DRUG ALLERGIES:  No Known Allergies  VITALS:  Blood pressure 139/61, pulse 72, temperature 98.1 F (36.7 C), temperature source Oral, resp. rate 20, height 5' 8" (1.727 m), weight 57.2 kg (126 lb 3 oz), SpO2 100 %.  PHYSICAL EXAMINATION:  GENERAL:  75 y.o.-year-old patient lying in the bed with no acute distress.  EYES: Pupils equal, round, reactive to light and accommodation. No scleral icterus. Extraocular muscles intact.  HEENT: Head atraumatic, normocephalic. Oropharynx and nasopharynx clear.  NECK:  Supple, no jugular venous distention. No thyroid enlargement, no tenderness.  LUNGS: Normal breath sounds bilaterally, no wheezing, rales,rhonchi or crepitation. No use of accessory muscles of respiration.  CARDIOVASCULAR: S1, S2 normal. No murmurs, rubs, or gallops.  ABDOMEN: Soft, nontender, nondistended. Bowel sounds present. No organomegaly or mass.  EXTREMITIES: No pedal edema, cyanosis, or clubbing.  NEUROLOGIC: Cranial nerves II through XII are  intact. Muscle strength 5/5 in all extremities. Sensation intact. Gait not checked.  PSYCHIATRIC: The patient is alert and oriented x 3.  SKIN: No obvious rash, lesion, or ulcer.    LABORATORY PANEL:   CBC  Recent Labs Lab 05/09/16 0520  WBC 5.1  HGB 8.3*  HCT 25.7*  PLT 208   ------------------------------------------------------------------------------------------------------------------  Chemistries   Recent Labs Lab 05/09/16 0520 05/10/16 0555  NA 138 136  K 4.8 5.2*  CL 111 112*  CO2 24 21*  GLUCOSE 95 84  BUN 32* 29*  CREATININE 1.42* 1.26*  CALCIUM 10.6* 10.7*  AST 27  --   ALT 11*  --   ALKPHOS 120  --   BILITOT 0.6  --    ------------------------------------------------------------------------------------------------------------------  Cardiac Enzymes No results for input(s): TROPONINI in the last 168 hours. ------------------------------------------------------------------------------------------------------------------  RADIOLOGY:  Ct Biopsy  Result Date: 05/08/2016 INDICATION: Lymphoma. EXAM: CT BIOPSY MEDICATIONS: None. ANESTHESIA/SEDATION: Moderate (conscious) sedation was employed during this procedure. A total of Versed 2.0 mg and Fentanyl 75 mcg was administered intravenously. Moderate Sedation Time: 13 minutes. The patient's level of consciousness and vital signs were monitored continuously by radiology nursing throughout the procedure under my direct supervision. COMPLICATIONS: None immediate. PROCEDURE: Informed written consent was obtained from the patient after a thorough discussion of the procedural risks, benefits and alternatives. All questions were addressed. Sterile Barrier Technique was utilized including mask, sterile gloves, sterile drape, hand hygiene and skin antiseptic. A timeout was performed prior to the initiation of the procedure. Patient was placed in prone position. Posterior flank region was prepped and draped using sterile  technique. Under CT guidance, lidocaine was injected into the subcutaneous tissues and subperiosteally into right  iliac bone. Then, under CT guidance, trocar was passed through posterior margin of right iliac bone. Bone marrow aspirates were obtained with and without heparin administration. Core biopsy was obtained as well. The samples were given to the pathology technologist who was present at that time. Needle was removed and appropriate dressing was applied. IMPRESSION: Under CT guidance, percutaneous bone marrow aspiration and core biopsy of right iliac bone was performed. Electronically Signed   By: James  Green Jr, M.D.   On: 05/08/2016 13:25    EKG:   Orders placed or performed in visit on 06/21/15  . EKG 12-Lead    ASSESSMENT AND PLAN:   #1.75-year-old male patient with renal transplant in 2006 baseline creatinine 1.9 followed by [facility] nephrology on Prograf, prednisone, mycophenolate comes in as direct admit  because of tumor lysis syndrome.  #1 acute renal failure on CK distress 3: Hold the Myfortic, continue prednisone, tacrolimus, check tacrolimus trough levels, continue aggressive hydration, monitor renal function, urine output, volume status, followed by nephrology,Consult with Dr. Kollluru   For his shortness of breath today, we are not giving her Lasix because of  renal failure/tumor lysis syndrome.  , #2 . Tumor lysis syndrome 'uric acid 9.2. Started on allopurinol.  #3 hypercalcemia with hyperparathyroidism due to malignancy continue IV hydration  #4./Posttransplant lymphoproliferative disorder; stage IV B diffuse large B-cell lymphoma'patient had a bone marrow aspiration yesterday. Echo showed EF 55-60%. His first chemotherapy yesterday, monitor for another 24 hours at least on IV hydration to  Prevent  Worsening of tumor lysis syndrome. Bone marrow biopsy result shows MONOMORPHIC POSTTRANSPLANT LYMPHOPROLIFERATIVE DISORDER WITH FEATURES  OF DIFFUSE LARGE B-CELL LYMPHOMA (DLBCL),  CONSISTENT WITH GERMINAL  CENTER B.  D/w RN,nephrology and pt and pts wife,  All the records are reviewed and case discussed with Care Management/Social Workerr. Management plans discussed with the patient, family and they are in agreement.  CODE STATUS: full TOTAL TIME TAKING CARE OF THIS PATIENT: 35 minutes.   POSSIBLE D/C IN 1-2 DAYS, DEPENDING ON CLINICAL CONDITION.   , M.D on 05/10/2016 at 11:54 AM  Between 7am to 6pm - Pager - [phone]  After 6pm go to [email or web address] - password [email or web address]  [facility]  Office  [phone]  CC: Primary care physician; Melinda Lada, MD   Note: This dictation was prepared with Dragon dictation along with smaller phrase technology. Any transcriptional errors that result from this process are unintentional. 

## 2016-05-10 NOTE — Progress Notes (Signed)
[facility]  ROUNDING NOTE   Subjective:   Complains of shortness of breath.   Objective:  Vital signs in last 24 hours:  Temp:  [97.7 F (36.5 C)-98.4 F (36.9 C)] 98.1 F (36.7 C) (11/18 0502) Pulse Rate:  [57-78] 72 (11/18 0502) Resp:  [20] 20 (11/17 1316) BP: (117-157)/(41-61) 139/61 (11/18 0502) SpO2:  [87 %-100 %] 100 % (11/18 0725)  Weight change:  Filed Weights   05/06/16 1753 05/06/16 2200  Weight: 57.2 kg (126 lb 3 oz) 57.2 kg (126 lb 3 oz)    Intake/Output: I/O last 3 completed shifts: In: 4823.6 [P.O.:480; I.V.:3818.8; IV Piggyback:524.8] Out: 770 [Urine:770]   Intake/Output this shift:  Total I/O In: 1535 [P.O.:240; I.V.:1295] Out: 300 [Urine:300]  Physical Exam: General: NAD, cachectic, laying in bed  Head: Normocephalic, atraumatic. Moist oral mucosal membranes  Eyes: Anicteric, PERRL  Neck: Supple, trachea midline  Lungs:  Clear to auscultation  Heart: Regular rate and rhythm  Abdomen:  Soft, nontender, right lower quadrant nontender allograft kidney  Extremities:  no peripheral edema.  Neurologic: Nonfocal, moving all four extremities  Skin: No lesions  Access: Right forearm AVF +bruit and +thrill    Basic Metabolic Panel:  Recent Labs Lab 05/06/16 1147 05/07/16 0517 05/08/16 0515 05/09/16 0520 05/10/16 0555  NA 125* 129* 134* 138 136  K 4.8 4.8 4.7 4.8 5.2*  CL 88* 96* 104 111 112*  CO2 27 26 25 24  21*  GLUCOSE 133* 95 100* 95 84  BUN 57* 47* 43* 32* 29*  CREATININE 1.88* 1.65* 1.47* 1.42* 1.26*  CALCIUM 11.6* 10.8* 10.3 10.6* 10.7*  PHOS  --   --  2.5 2.0*  --     Liver Function Tests:  Recent Labs Lab 05/08/16 0515 05/09/16 0520  AST  --  27  ALT  --  11*  ALKPHOS  --  120  BILITOT  --  0.6  PROT  --  5.3*  ALBUMIN 2.6* 2.6*   No results for input(s): LIPASE, AMYLASE in the last 168 hours. No results for input(s): AMMONIA in the last 168 hours.  CBC:  Recent Labs Lab 05/06/16 1147 05/07/16 0517  05/08/16 0020 05/09/16 0520  WBC 6.5 5.0 4.3 5.1  NEUTROABS 5.7  --  3.6 4.2  HGB 9.3* 8.4* 8.7* 8.3*  HCT 27.9* 25.2* 26.5* 25.7*  MCV 73.5* 74.3* 73.9* 74.5*  PLT 248 213 224 208    Cardiac Enzymes: No results for input(s): CKTOTAL, CKMB, CKMBINDEX, TROPONINI in the last 168 hours.  BNP: Invalid input(s): POCBNP  CBG: No results for input(s): GLUCAP in the last 168 hours.  Microbiology: Results for orders placed or performed in visit on 10/09/15  Microscopic Examination     Status: Abnormal   Collection Time: 10/09/15  8:49 AM  Result Value Ref Range Status   WBC, UA 6-10 (A) 0 - 5 /hpf Final   RBC, UA 0-2 0 - 2 /hpf Final   Epithelial Cells (non renal) 0-10 0 - 10 /hpf Final   Bacteria, UA None seen None seen/Few Final  CULTURE, URINE COMPREHENSIVE     Status: Abnormal   Collection Time: 10/09/15  8:49 AM  Result Value Ref Range Status   Urine Culture, Comprehensive Final report (A)  Final   Result 1 Comment (A)  Final    Comment: Vancomycin-resistant Enterococcus (Enterococcus faecalis) 400 Colonies/mL    ANTIMICROBIAL SUSCEPTIBILITY Comment  Final    Comment:       ** S =  Susceptible; I = Intermediate; R = Resistant **                    P = Positive; N = Negative             MICS are expressed in micrograms per mL    Antibiotic                 RSLT#1    RSLT#2    RSLT#3    RSLT#4 Ciprofloxacin                  R Levofloxacin                   R Linezolid                      S Nitrofurantoin                 S Penicillin                     S Tetracycline                   R Vancomycin                     R   GC/Chlamydia Probe Amp     Status: None   Collection Time: 10/09/15  8:53 AM  Result Value Ref Range Status   Chlamydia trachomatis, NAA Negative Negative Final   Neisseria gonorrhoeae by PCR Negative Negative Final    Coagulation Studies:  Recent Labs  05/08/16 0020  LABPROT 13.6  INR 1.04    Urinalysis: No results for input(s):  COLORURINE, LABSPEC, PHURINE, GLUCOSEU, HGBUR, BILIRUBINUR, KETONESUR, PROTEINUR, UROBILINOGEN, NITRITE, LEUKOCYTESUR in the last 72 hours.  Invalid input(s): APPERANCEUR    Imaging: Ct Biopsy  Result Date: 05/08/2016 INDICATION: Lymphoma. EXAM: CT BIOPSY MEDICATIONS: None. ANESTHESIA/SEDATION: Moderate (conscious) sedation was employed during this procedure. A total of Versed 2.0 mg and Fentanyl 75 mcg was administered intravenously. Moderate Sedation Time: 13 minutes. The patient's level of consciousness and vital signs were monitored continuously by radiology nursing throughout the procedure under my direct supervision. COMPLICATIONS: None immediate. PROCEDURE: Informed written consent was obtained from the patient after a thorough discussion of the procedural risks, benefits and alternatives. All questions were addressed. Sterile Barrier Technique was utilized including mask, sterile gloves, sterile drape, hand hygiene and skin antiseptic. A timeout was performed prior to the initiation of the procedure. Patient was placed in prone position. Posterior flank region was prepped and draped using sterile technique. Under CT guidance, lidocaine was injected into the subcutaneous tissues and subperiosteally into right iliac bone. Then, under CT guidance, trocar was passed through posterior margin of right iliac bone. Bone marrow aspirates were obtained with and without heparin administration. Core biopsy was obtained as well. The samples were given to the pathology technologist who was present at that time. Needle was removed and appropriate dressing was applied. IMPRESSION: Under CT guidance, percutaneous bone marrow aspiration and core biopsy of right iliac bone was performed. Electronically Signed   By: Marijo Conception, M.D.   On: 05/08/2016 13:25     Medications:   . sodium chloride     . allopurinol  100 mg Oral Daily  . docusate sodium  100 mg Oral BID  . enoxaparin (LOVENOX) injection  40 mg  Subcutaneous Q24H  . feeding supplement (ENSURE ENLIVE)  237 mL Oral TID BM  . finasteride  5 mg Oral Daily  . gentamicin irrigation   Irrigation Once  . metoprolol tartrate  25 mg Oral BID  . multivitamin with minerals  1 tablet Oral Daily  . oxybutynin  5 mg Oral Daily  . pantoprazole  40 mg Oral Daily  . predniSONE  70 mg Oral Q breakfast  . sucralfate  1 g Oral QID  . tacrolimus  3 mg Oral BID  . tamsulosin  0.4 mg Oral Daily   acetaminophen **OR** acetaminophen, albuterol, HYDROcodone-acetaminophen, lactulose, ondansetron **OR** ondansetron (ZOFRAN) IV, polyvinyl alcohol, polyvinyl alcohol  Assessment/ Plan:  [name] is a 75 y.o. black male with renal transplant 2006 with baseline creatinine of 1.6-1.9 followed by [facility], Dr. Suzan Nailer on prograf, myfortic and prednisone. Patient with past medical history of pulmonary hyeprtension, atrial fibrillation, hypertension, anemia, hyperlipidemia, gout, GERD, BPH, history of GI bleed , who was admitted to [facility] on 05/06/2016 for diffuse large B cell lymphoma with hyponatremia, hypercalcemia, hyperuricemia And chronic kidney disease on renal transplant.   1. Acute renal failure on chronic kidney disease stage IIIT: renal transplant in 2006 with baseline creatinine of 1.6-1.9. Holding myfortic. Continues on tacrolimus and prednisone.  - Pending tacrolimus trough level.  - Monitor renal function, urine output and volume status.  - IV furosemide x 1  2. Hyponatremia: consistent with hypovolemic hyponatremia. improved  3. Hypercalemia with hyperparathyroidism: consistent with primary but with renal insufficiency. Secondary to malignancy. Calcium improved.  - Continue IV fluids - Low threshold to start cinacalcet  4. Tumor lysis syndrome: uric acid 9.2 on admission. G6PD negative. Uric acid now 7.1 -  Allopurinol - Appreciate hematology input.    LOS: [place], [place] 11/18/201712:11 PM

## 2016-05-10 NOTE — Progress Notes (Addendum)
[facility] Hematology/Oncology Progress Note  Date of admission: 05/06/2016  Hospital day:  05/09/2016  Chief Complaint: Matthew Brown is a 75 y.o. male with post transplant lymphoproliferative disorder (PTLD), stage IVB diffuse large B cell lymphoma who was admitted with tumor lysis syndrome.  Subjective:  Feeling good today.  Social History: The patient is accompanied by his wife and a gentleman today.  Allergies: No Known Allergies  Scheduled Medications: . allopurinol  100 mg Oral Daily  . docusate sodium  100 mg Oral BID  . enoxaparin (LOVENOX) injection  40 mg Subcutaneous Q24H  . feeding supplement (ENSURE ENLIVE)  237 mL Oral TID BM  . finasteride  5 mg Oral Daily  . gentamicin irrigation   Irrigation Once  . metoprolol tartrate  25 mg Oral BID  . multivitamin with minerals  1 tablet Oral Daily  . oxybutynin  5 mg Oral Daily  . pantoprazole  40 mg Oral Daily  . predniSONE  70 mg Oral Q breakfast  . sucralfate  1 g Oral QID  . tacrolimus  3 mg Oral BID  . tamsulosin  0.4 mg Oral Daily    Review of Systems: GENERAL:  Feels good. No fevers or sweats.  Weight loss. PERFORMANCE STATUS (ECOG):  1 Lungs: No shortness of breath or cough. No hemoptysis. Cardiac: No chest pain, palpitations, orthopnea, or PND. GI: No nausea, vomiting, diarrhea, constipation, melena or hematochezia. GU: Enlarged prostate. No urgency, frequency, dysuria, or hematuria. Musculoskeletal: Back pain and left hip pain. No joint pain. No muscle tenderness. Extremities: No pain or swelling. Skin: No rashes or skin changes. Neuro: No numbness or weakness, balance or coordination issues. Endocrine: No diabetes, thyroid issues, hot flashes or night sweats. Psych: No mood changes, depression or anxiety. Pain: Port-a-cath site "ok". Review of systems: All other systems reviewed and found to be negative.  Physical Exam: Blood pressure 139/61, pulse 72, temperature 98.1  F (36.7 C), temperature source Oral, resp. rate 20, height 5' 8" (1.727 m), weight 126 lb 3 oz (57.2 kg), SpO2 (!) 87 %.  GENERAL:Thin elderly gentleman lying comfortably on the medical unit in no acute distress. MENTAL STATUS: Alert and oriented to person, place and time. HEAD:Gray goatee. Normocephalic, atraumatic, face symmetric, no Cushingoid features. EYES:Glasses. Brown eyes. Pupils equal round and reactive to light and accomodation. No conjunctivitis or scleral icterus. SJG:GEZMOQHUTM clear without lesion. Edentulous. Tonguenormal. Mucous membranes dry. RESPIRATORY:Clear to auscultationwithout rales, wheezes or rhonchi. CARDIOVASCULAR:Regular rate andrhythmwithout murmur, rub or gallop. ABDOMEN:Soft, non-tender, with active bowel sounds, and no hepatosplenomegaly. No masses.  SKIN: No rashes, ulcers or lesions. EXTREMITIES: No edema, no skin discoloration or tenderness. No palpable cords. NEUROLOGICAL: Unremarkable. PSYCH: Appropriate.   Results for orders placed or performed during the hospital encounter of 05/06/16 (from the past 48 hour(s))  CBC with Differential     Status: Abnormal   Collection Time: 05/09/16  5:20 AM  Result Value Ref Range   WBC 5.1 3.8 - 10.6 K/uL   RBC 3.45 (L) 4.40 - 5.90 MIL/uL   Hemoglobin 8.3 (L) 13.0 - 18.0 g/dL   HCT 25.7 (L) 40.0 - 52.0 %   MCV 74.5 (L) 80.0 - 100.0 fL   MCH 24.1 (L) 26.0 - 34.0 pg   MCHC 32.3 32.0 - 36.0 g/dL   RDW 19.5 (H) 11.5 - 14.5 %   Platelets 208 150 - 440 K/uL   Neutrophils Relative % 81 %   Neutro Abs 4.2 1.4 - 6.5 K/uL  Lymphocytes Relative 6 %   Lymphs Abs 0.3 (L) 1.0 - 3.6 K/uL   Monocytes Relative 11 %   Monocytes Absolute 0.5 0.2 - 1.0 K/uL   Eosinophils Relative 1 %   Eosinophils Absolute 0.1 0 - 0.7 K/uL   Basophils Relative 1 %   Basophils Absolute 0.0 0 - 0.1 K/uL  Comprehensive metabolic panel     Status: Abnormal   Collection Time: 05/09/16  5:20 AM  Result Value  Ref Range   Sodium 138 135 - 145 mmol/L   Potassium 4.8 3.5 - 5.1 mmol/L   Chloride 111 101 - 111 mmol/L   CO2 24 22 - 32 mmol/L   Glucose, Bld 95 65 - 99 mg/dL   BUN 32 (H) 6 - 20 mg/dL   Creatinine, Ser 1.42 (H) 0.61 - 1.24 mg/dL   Calcium 10.6 (H) 8.9 - 10.3 mg/dL   Total Protein 5.3 (L) 6.5 - 8.1 g/dL   Albumin 2.6 (L) 3.5 - 5.0 g/dL   AST 27 15 - 41 U/L   ALT 11 (L) 17 - 63 U/L   Alkaline Phosphatase 120 38 - 126 U/L   Total Bilirubin 0.6 0.3 - 1.2 mg/dL   GFR calc non Af Amer 46 (L) >60 mL/min   GFR calc Af Amer 54 (L) >60 mL/min    Comment: (NOTE) The eGFR has been calculated using the CKD EPI equation. This calculation has not been validated in all clinical situations. eGFR's persistently <60 mL/min signify possible Chronic Kidney Disease.    Anion gap 3 (L) 5 - 15  Uric acid     Status: None   Collection Time: 05/09/16  5:20 AM  Result Value Ref Range   Uric Acid, Serum 7.2 4.4 - 7.6 mg/dL  Lactate dehydrogenase     Status: Abnormal   Collection Time: 05/09/16  5:20 AM  Result Value Ref Range   LDH 416 (H) 98 - 192 U/L  Phosphorus     Status: Abnormal   Collection Time: 05/09/16  5:20 AM  Result Value Ref Range   Phosphorus 2.0 (L) 2.5 - 4.6 mg/dL   Ct Biopsy  Result Date: 05/08/2016 INDICATION: Lymphoma. EXAM: CT BIOPSY MEDICATIONS: None. ANESTHESIA/SEDATION: Moderate (conscious) sedation was employed during this procedure. A total of Versed 2.0 mg and Fentanyl 75 mcg was administered intravenously. Moderate Sedation Time: 13 minutes. The patient's level of consciousness and vital signs were monitored continuously by radiology nursing throughout the procedure under my direct supervision. COMPLICATIONS: None immediate. PROCEDURE: Informed written consent was obtained from the patient after a thorough discussion of the procedural risks, benefits and alternatives. All questions were addressed. Sterile Barrier Technique was utilized including mask, sterile gloves,  sterile drape, hand hygiene and skin antiseptic. A timeout was performed prior to the initiation of the procedure. Patient was placed in prone position. Posterior flank region was prepped and draped using sterile technique. Under CT guidance, lidocaine was injected into the subcutaneous tissues and subperiosteally into right iliac bone. Then, under CT guidance, trocar was passed through posterior margin of right iliac bone. Bone marrow aspirates were obtained with and without heparin administration. Core biopsy was obtained as well. The samples were given to the pathology technologist who was present at that time. Needle was removed and appropriate dressing was applied. IMPRESSION: Under CT guidance, percutaneous bone marrow aspiration and core biopsy of right iliac bone was performed. Electronically Signed   By: Marijo Conception, M.D.   On: 05/08/2016 13:25  Assessment:  Matthew Brown is a 75 y.o. male with stage IVBE diffuse large B cell lymphoma. He has a post-transplant lymphoproliferative disorder (PTLD).  He is s/p renal transplant.  He has tumor lysis syndrome, improving with hydration and allopurinol.  Plan:   1. Oncology:  Patient had port-a-cath placed (05/07/2016) and bone marrow aspirate and biopsy (05/08/2016).  Echo on 05/06/2016 revealed an EF of 55-60%.  Hepatitis testing negative.  Patient presented at tumor board on 05/08/2016.  Cycle #1 mini-RCHOP today.  Patient will receive prednisone 70 mg a day x 5 days (orderes written).  Patient's baseline prednisone dose of 5 mg a day will be held until his 5 day burst of steroids complete.  Watching for potential worsening of tumor lysis syndrome.  Fluids and allopurinol continue.  G6PD assay normal (patient may receive rasburicase if needed).    2.  Hematology:  Anemia work-up on 04/15/2016 revealed an iron saturation of 6% and TIBC 241 c/w chronic disease.  Ferritin was 343.  B12 and folate were normal.  Reticulocyte count was 2% (low for  level of anemia).  Await bone marrow to assess for lymphoma.  Type and screen added to tomorrow's labs.  If patient requires blood products, they need to be leukopoor and irradiated.  3.  Nephrology:  Patient is s/p renal transplant on Prograf and prednisone.  MMF was discontinued.  Hydration.  Prograf trough level ordered.  Continue fluids and allopurinol.  Creatinine improved to 1.47.  Daily BMP and uric acid.  If tumor lysis significant, may need BID tumor lysis labs.  Appreciate nephrology conslt.  4.  Code status:  Discussed with patient and his wife.  Full Code.  5.  Disposition:  Anticipate discharge early next week unless significant issues with tumor lysis.  Dr. Herb Grays will be seeing the patient over the weekend.  Please call if any questions or concerns.   Lequita Asal, MD  05/09/2016

## 2016-05-10 NOTE — Progress Notes (Signed)
[facility] Hematology/Oncology Progress Note  Date of admission: 05/06/2016  Hospital day:  05/09/2016  Chief Complaint: Matthew Brown is a 75 y.o. male with post transplant lymphoproliferative disorder (PTLD), stage IVB diffuse large B cell lymphoma who was admitted with tumor lysis syndrome.  Subjective:  Initiated chemotherapy with mini R CHOP yesterday, tolerated it well. No complaints today.   Social History: Patient resting comfortably with no family members in the room today.   Allergies: No Known Allergies  Scheduled Medications: . furosemide      . allopurinol  100 mg Oral Daily  . docusate sodium  100 mg Oral BID  . enoxaparin (LOVENOX) injection  40 mg Subcutaneous Q24H  . feeding supplement (ENSURE ENLIVE)  237 mL Oral TID BM  . finasteride  5 mg Oral Daily  . gentamicin irrigation   Irrigation Once  . metoprolol tartrate  25 mg Oral BID  . multivitamin with minerals  1 tablet Oral Daily  . oxybutynin  5 mg Oral Daily  . pantoprazole  40 mg Oral Daily  . predniSONE  70 mg Oral Q breakfast  . sucralfate  1 g Oral QID  . tacrolimus  3 mg Oral BID  . tamsulosin  0.4 mg Oral Daily    Review of Systems: GENERAL:  Feels good. No fevers or sweats.  Weight loss. PERFORMANCE STATUS (ECOG):  1 Lungs: No shortness of breath or cough. No hemoptysis. Cardiac: No chest pain, palpitations, orthopnea, or PND. GI: No nausea, vomiting, diarrhea, constipation, melena or hematochezia. GU: Enlarged prostate. No urgency, frequency, dysuria, or hematuria. Musculoskeletal: Back pain and left hip pain. No joint pain. No muscle tenderness. Extremities: No pain or swelling. Skin: No rashes or skin changes. Neuro: No numbness or weakness, balance or coordination issues. Endocrine: No diabetes, thyroid issues, hot flashes or night sweats. Psych: No mood changes, depression or anxiety. Pain: Port-a-cath site "ok". Review of systems: All other systems  reviewed and found to be negative.  Physical Exam: Blood pressure 139/61, pulse 72, temperature 98.1 F (36.7 C), temperature source Oral, resp. rate 20, height _0  (1.727 m), weight 126 lb 3 oz (57.2 kg), SpO2 100 %.  GENERAL:Thin elderly gentleman lying comfortably on the medical unit in no acute distress. MENTAL STATUS: Alert and oriented to person, place and time. HEAD:Gray goatee. Normocephalic, atraumatic, face symmetric, no Cushingoid features. EYES:Glasses. Brown eyes. Pupils equal round and reactive to light and accomodation. No conjunctivitis or scleral icterus. VWU:JWJXBJYNWG clear without lesion. Edentulous. Tonguenormal. Mucous membranes dry. RESPIRATORY:Clear to auscultationwithout rales, wheezes or rhonchi. CARDIOVASCULAR:Regular rate andrhythmwithout murmur, rub or gallop. ABDOMEN:Soft, non-tender, with active bowel sounds, and no hepatosplenomegaly. No masses.  SKIN: No rashes, ulcers or lesions. EXTREMITIES: No edema, no skin discoloration or tenderness. No palpable cords. NEUROLOGICAL: Unremarkable. PSYCH: Appropriate.   Results for orders placed or performed during the hospital encounter of 05/06/16 (from the past 48 hour(s))  CBC with Differential     Status: Abnormal   Collection Time: 05/09/16  5:20 AM  Result Value Ref Range   WBC 5.1 3.8 - 10.6 K/uL   RBC 3.45 (L) 4.40 - 5.90 MIL/uL   Hemoglobin 8.3 (L) 13.0 - 18.0 g/dL   HCT 25.7 (L) 40.0 - 52.0 %   MCV 74.5 (L) 80.0 - 100.0 fL   MCH 24.1 (L) 26.0 - 34.0 pg   MCHC 32.3 32.0 - 36.0 g/dL   RDW 19.5 (H) 11.5 - 14.5 %   Platelets 208 150 - 440  K/uL   Neutrophils Relative % 81 %   Neutro Abs 4.2 1.4 - 6.5 K/uL   Lymphocytes Relative 6 %   Lymphs Abs 0.3 (L) 1.0 - 3.6 K/uL   Monocytes Relative 11 %   Monocytes Absolute 0.5 0.2 - 1.0 K/uL   Eosinophils Relative 1 %   Eosinophils Absolute 0.1 0 - 0.7 K/uL   Basophils Relative 1 %   Basophils Absolute 0.0 0 - 0.1 K/uL   Comprehensive metabolic panel     Status: Abnormal   Collection Time: 05/09/16  5:20 AM  Result Value Ref Range   Sodium 138 135 - 145 mmol/L   Potassium 4.8 3.5 - 5.1 mmol/L   Chloride 111 101 - 111 mmol/L   CO2 24 22 - 32 mmol/L   Glucose, Bld 95 65 - 99 mg/dL   BUN 32 (H) 6 - 20 mg/dL   Creatinine, Ser 1.42 (H) 0.61 - 1.24 mg/dL   Calcium 10.6 (H) 8.9 - 10.3 mg/dL   Total Protein 5.3 (L) 6.5 - 8.1 g/dL   Albumin 2.6 (L) 3.5 - 5.0 g/dL   AST 27 15 - 41 U/L   ALT 11 (L) 17 - 63 U/L   Alkaline Phosphatase 120 38 - 126 U/L   Total Bilirubin 0.6 0.3 - 1.2 mg/dL   GFR calc non Af Amer 46 (L) >60 mL/min   GFR calc Af Amer 54 (L) >60 mL/min    Comment: (NOTE) The eGFR has been calculated using the CKD EPI equation. This calculation has not been validated in all clinical situations. eGFR's persistently <60 mL/min signify possible Chronic Kidney Disease.    Anion gap 3 (L) 5 - 15  Uric acid     Status: None   Collection Time: 05/09/16  5:20 AM  Result Value Ref Range   Uric Acid, Serum 7.2 4.4 - 7.6 mg/dL  Lactate dehydrogenase     Status: Abnormal   Collection Time: 05/09/16  5:20 AM  Result Value Ref Range   LDH 416 (H) 98 - 192 U/L  Phosphorus     Status: Abnormal   Collection Time: 05/09/16  5:20 AM  Result Value Ref Range   Phosphorus 2.0 (L) 2.5 - 4.6 mg/dL  Basic metabolic panel     Status: Abnormal   Collection Time: 05/10/16  5:55 AM  Result Value Ref Range   Sodium 136 135 - 145 mmol/L   Potassium 5.2 (H) 3.5 - 5.1 mmol/L   Chloride 112 (H) 101 - 111 mmol/L   CO2 21 (L) 22 - 32 mmol/L   Glucose, Bld 84 65 - 99 mg/dL   BUN 29 (H) 6 - 20 mg/dL   Creatinine, Ser 1.26 (H) 0.61 - 1.24 mg/dL   Calcium 10.7 (H) 8.9 - 10.3 mg/dL   GFR calc non Af Amer 54 (L) >60 mL/min   GFR calc Af Amer >60 >60 mL/min    Comment: (NOTE) The eGFR has been calculated using the CKD EPI equation. This calculation has not been validated in all clinical situations. eGFR's persistently  <60 mL/min signify possible Chronic Kidney Disease.    Anion gap 3 (L) 5 - 15  Uric acid     Status: None   Collection Time: 05/10/16  5:55 AM  Result Value Ref Range   Uric Acid, Serum 7.1 4.4 - 7.6 mg/dL   No results found.  Assessment:  Matthew Brown is a 75 y.o. male with stage IVBE diffuse large B cell lymphoma. He  has a post-transplant lymphoproliferative disorder (PTLD).  He is s/p renal transplant.  He has tumor lysis syndrome, improving with hydration and allopurinol.  Plan:   1. Oncology:  Patient had port-a-cath placed (05/07/2016) and bone marrow aspirate and biopsy (05/08/2016).  Echo on 05/06/2016 revealed an EF of 55-60%.  Hepatitis testing negative.  Patient presented at tumor board on 05/08/2016.  Cycle #1 mini-RCHOP given on Friday, May 09, 2016. Continue prednisone 70 mg a day x 5 days (orders written).  Patient's baseline prednisone dose of 5 mg a day will be held until his 5 day burst of steroids complete.  Watching for potential worsening of tumor lysis syndrome.  Fluids and allopurinol continue.  G6PD assay normal (patient may receive rasburicase if needed).  Creatinine improving, potassium trended up slightly. Monitor.  2.  Hematology:  Anemia work-up on 04/15/2016 revealed an iron saturation of 6% and TIBC 241 c/w chronic disease.  Ferritin was 343.  B12 and folate were normal.  Reticulocyte count was 2% (low for level of anemia).  Await bone marrow to assess for lymphoma.  Type and screen added to tomorrow's labs.  If patient requires blood products, they need to be leukopoor and irradiated.  3.  Nephrology:  Patient is s/p renal transplant on Prograf and prednisone.  MMF was discontinued.  Hydration.  Prograf trough level ordered.  Continue fluids and allopurinol.  Creatinine improved to 1.26.  Daily BMP and uric acid.  If tumor lysis significant, may need BID tumor lysis labs.  Appreciate nephrology conslt.  4.  Code status:  Discussed with patient and his  wife.  Full Code.  5.  Disposition:  Anticipate discharge early next week unless significant issues with tumor lysis.   Lloyd Huger, MD  05/09/2016

## 2016-05-11 ENCOUNTER — Inpatient Hospital Stay: Payer: Commercial Managed Care - HMO

## 2016-05-11 LAB — BASIC METABOLIC PANEL
Anion gap: 2 — ABNORMAL LOW (ref 5–15)
BUN: 39 mg/dL — ABNORMAL HIGH (ref 6–20)
CO2: 24 mmol/L (ref 22–32)
Calcium: 11.2 mg/dL — ABNORMAL HIGH (ref 8.9–10.3)
Chloride: 110 mmol/L (ref 101–111)
Creatinine, Ser: 1.38 mg/dL — ABNORMAL HIGH (ref 0.61–1.24)
GFR calc Af Amer: 56 mL/min — ABNORMAL LOW (ref >60)
GFR calc non Af Amer: 48 mL/min — ABNORMAL LOW (ref >60)
Glucose, Bld: 120 mg/dL — ABNORMAL HIGH (ref 65–99)
Potassium: 5.4 mmol/L — ABNORMAL HIGH (ref 3.5–5.1)
Sodium: 136 mmol/L (ref 135–145)

## 2016-05-11 LAB — TYPE AND SCREEN
ABO/RH(D): O POS
Antibody Screen: NEGATIVE

## 2016-05-11 LAB — URIC ACID: Uric Acid, Serum: 8 mg/dL — ABNORMAL HIGH (ref 4.4–7.6)

## 2016-05-11 MED ORDER — SODIUM CHLORIDE 0.9% FLUSH
10.0000 mL | INTRAVENOUS | Status: AC | PRN
  Administered 2016-05-13: 02:00:00 10 mL

## 2016-05-11 MED ORDER — POLYETHYLENE GLYCOL 3350 17 G PO PACK: 17.0000 g | PACK | Freq: Every day | ORAL | Status: DC | PRN

## 2016-05-11 MED ORDER — SODIUM CHLORIDE 0.9% FLUSH
3.0000 mL | Freq: Two times a day (BID) | INTRAVENOUS | Status: DC
  Administered 2016-05-11 – 2016-05-13 (×5): 3 mL via INTRAVENOUS

## 2016-05-11 MED ORDER — DIPHENHYDRAMINE HCL 25 MG PO CAPS
25.0000 mg | ORAL_CAPSULE | Freq: Every evening | ORAL | Status: DC | PRN
  Administered 2016-05-11 (×2): 25 mg via ORAL
  Filled 2016-05-11 (×2): qty 1

## 2016-05-11 NOTE — Progress Notes (Signed)
Dr. Juleen China and Dr. Vianne Bulls aware of labs today. Diet changed to renal with fluid restriction with pt and wife familiar. Reports feels well today; offers no co's. Tolerating diet well. Slept long intervals.

## 2016-05-11 NOTE — Plan of Care (Signed)
Problem: Education: Goal: Knowledge of [facility] Education information/materials will improve Outcome: Progressing VSS, free of falls during shift.  BP 108/46, Dr. Jannifer Franklin paged, scheduled metoprolol held.  Denies pain, nausea.  Reported soft stool, scheduled colace held.  No biopatch in place on [facility] access needle, reaccessed, dsg changed.  No complaints overnight.  Wife at bedside, call bell within reach.  WCTM.

## 2016-05-11 NOTE — Progress Notes (Signed)
[facility]  ROUNDING NOTE   Subjective:   Wife at bedside.  K 5.4 - drinking orange juice  No more shortness of breath  Objective:  Vital signs in last 24 hours:  Temp:  [97.4 F (36.3 C)-97.6 F (36.4 C)] 97.6 F (36.4 C) (11/19 0531) Pulse Rate:  [70-80] 80 (11/19 0531) Resp:  [18] 18 (11/19 0531) BP: (108-129)/(46-54) 129/54 (11/19 0531) SpO2:  [93 %-100 %] 94 % (11/19 0531) Weight:  [60 kg (132 lb 3 oz)] 60 kg (132 lb 3 oz) (11/18 2300)  Weight change:  Filed Weights   05/06/16 1753 05/06/16 2200 05/10/16 2300  Weight: 57.2 kg (126 lb 3 oz) 57.2 kg (126 lb 3 oz) 60 kg (132 lb 3 oz)    Intake/Output: I/O last 3 completed shifts: In: [id number] [P.O.:240; I.V.:1295] Out: 650 [Urine:650]   Intake/Output this shift:  Total I/O In: 358 [P.O.:358] Out: 275 [Urine:275]  Physical Exam: General: NAD, cachectic, laying in bed  Head: Normocephalic, atraumatic. Moist oral mucosal membranes  Eyes: Anicteric, PERRL  Neck: Supple, trachea midline  Lungs:  Bibasilar crackles  Heart: Regular rate and rhythm  Abdomen:  Soft, nontender, right lower quadrant nontender allograft kidney  Extremities:  no peripheral edema.  Neurologic: Nonfocal, moving all four extremities  Skin: No lesions  Access: Right forearm AVF +bruit and +thrill    Basic Metabolic Panel:  Recent Labs Lab 05/07/16 0517 05/08/16 0515 05/09/16 0520 05/10/16 0555 05/11/16 0548  NA 129* 134* 138 136 136  K 4.8 4.7 4.8 5.2* 5.4*  CL 96* 104 111 112* 110  CO2 26 25 24  21* 24  GLUCOSE 95 100* 95 84 120*  BUN 47* 43* 32* 29* 39*  CREATININE 1.65* 1.47* 1.42* 1.26* 1.38*  CALCIUM 10.8* 10.3 10.6* 10.7* 11.2*  PHOS  --  2.5 2.0*  --   --     Liver Function Tests:  Recent Labs Lab 05/08/16 0515 05/09/16 0520  AST  --  27  ALT  --  11*  ALKPHOS  --  120  BILITOT  --  0.6  PROT  --  5.3*  ALBUMIN 2.6* 2.6*   No results for input(s): LIPASE, AMYLASE in the last 168 hours. No results  for input(s): AMMONIA in the last 168 hours.  CBC:  Recent Labs Lab 05/06/16 1147 05/07/16 0517 05/08/16 0020 05/09/16 0520  WBC 6.5 5.0 4.3 5.1  NEUTROABS 5.7  --  3.6 4.2  HGB 9.3* 8.4* 8.7* 8.3*  HCT 27.9* 25.2* 26.5* 25.7*  MCV 73.5* 74.3* 73.9* 74.5*  PLT 248 213 224 208    Cardiac Enzymes: No results for input(s): CKTOTAL, CKMB, CKMBINDEX, TROPONINI in the last 168 hours.  BNP: Invalid input(s): POCBNP  CBG: No results for input(s): GLUCAP in the last 168 hours.  Microbiology: Results for orders placed or performed in visit on 10/09/15  Microscopic Examination     Status: Abnormal   Collection Time: 10/09/15  8:49 AM  Result Value Ref Range Status   WBC, UA 6-10 (A) 0 - 5 /hpf Final   RBC, UA 0-2 0 - 2 /hpf Final   Epithelial Cells (non renal) 0-10 0 - 10 /hpf Final   Bacteria, UA None seen None seen/Few Final  CULTURE, URINE COMPREHENSIVE     Status: Abnormal   Collection Time: 10/09/15  8:49 AM  Result Value Ref Range Status   Urine Culture, Comprehensive Final report (A)  Final   Result 1 Comment (A)  Final  Comment: Vancomycin-resistant Enterococcus (Enterococcus faecalis) 400 Colonies/mL    ANTIMICROBIAL SUSCEPTIBILITY Comment  Final    Comment:       ** S = Susceptible; I = Intermediate; R = Resistant **                    P = Positive; N = Negative             MICS are expressed in micrograms per mL    Antibiotic                 RSLT#1    RSLT#2    RSLT#3    RSLT#4 Ciprofloxacin                  R Levofloxacin                   R Linezolid                      S Nitrofurantoin                 S Penicillin                     S Tetracycline                   R Vancomycin                     R   GC/Chlamydia Probe Amp     Status: None   Collection Time: 10/09/15  8:53 AM  Result Value Ref Range Status   Chlamydia trachomatis, NAA Negative Negative Final   Neisseria gonorrhoeae by PCR Negative Negative Final    Coagulation Studies: No  results for input(s): LABPROT, INR in the last 72 hours.  Urinalysis: No results for input(s): COLORURINE, LABSPEC, PHURINE, GLUCOSEU, HGBUR, BILIRUBINUR, KETONESUR, PROTEINUR, UROBILINOGEN, NITRITE, LEUKOCYTESUR in the last 72 hours.  Invalid input(s): APPERANCEUR    Imaging: No results found.   Medications:   . sodium chloride Stopped (05/11/16 1016)   . allopurinol  100 mg Oral Daily  . docusate sodium  100 mg Oral BID  . enoxaparin (LOVENOX) injection  40 mg Subcutaneous Q24H  . feeding supplement (ENSURE ENLIVE)  237 mL Oral TID BM  . finasteride  5 mg Oral Daily  . gentamicin irrigation   Irrigation Once  . metoprolol tartrate  25 mg Oral BID  . multivitamin with minerals  1 tablet Oral Daily  . oxybutynin  5 mg Oral Daily  . pantoprazole  40 mg Oral Daily  . polyethylene glycol  17 g Oral Daily  . predniSONE  70 mg Oral Q breakfast  . sodium chloride flush  3 mL Intravenous Q12H  . sucralfate  1 g Oral QID  . tacrolimus  3 mg Oral BID  . tamsulosin  0.4 mg Oral Daily   acetaminophen **OR** acetaminophen, albuterol, diphenhydrAMINE, HYDROcodone-acetaminophen, lactulose, ondansetron **OR** ondansetron (ZOFRAN) IV, polyvinyl alcohol, polyvinyl alcohol  Assessment/ Plan:  [name] is a 75 y.o. black male with renal transplant 2006 with baseline creatinine of 1.6-1.9 followed by [facility]phrology, Dr. Suzan Nailer on prograf, myfortic and prednisone. Patient with past medical history of pulmonary hyeprtension, atrial fibrillation, hypertension, anemia, hyperlipidemia, gout, GERD, BPH, history of GI bleed , who was admitted to [facility] on 05/06/2016 for diffuse large B cell lymphoma with hyponatremia, hypercalcemia, hyperuricemia And chronic kidney disease on renal transplant.  1. Acute renal failure on chronic kidney disease stage IIIT: renal transplant in 2006 with baseline creatinine of 1.6-1.9. Holding myfortic. Continues on tacrolimus and prednisone.  - Checking tacrolimus  trough level in Am of 11/20 - Monitor renal function, urine output and volume status.   2. Hyperkalemia - change to low K diet.   3. Hypercalemia with hyperparathyroidism: consistent with primary but with renal insufficiency. Secondary to malignancy. Calcium improved.  - off IV fluids - Low threshold to start cinacalcet  4. Tumor lysis syndrome: uric acid 9.2 on admission. G6PD negative. Uric acid 8 -  Allopurinol - Appreciate hematology input.  - hold IV fluids due to pulmonary edema   LOS: [place], [place] 11/19/201710:35 AM

## 2016-05-11 NOTE — Progress Notes (Signed)
Dr. Vianne Bulls advised she got lab reports with nephro to [facility] on theses and they are aware.

## 2016-05-11 NOTE — Progress Notes (Signed)
AM labs reviewed with elevating K+, calcium, uric acid and slightly increasing creatinine. Paged Dr. Vianne Bulls with elevations of K+, Calcium,uric acid and creatine- will await further orders and continued to assess pt.

## 2016-05-11 NOTE — Progress Notes (Signed)
[place] at [place] NAME: Matthew Brown    MR#:  [id number]  DATE OF BIRTH:  1941/01/25  C/o of shortness of breath. But  No other complaints. O2 sats 94% on room air.   CHIEF COMPLAINT:  No chief complaint on file.   REVIEW OF SYSTEMS:   ROS CONSTITUTIONAL: No fever, fatigue or weakness.  EYES: No blurred or double vision.  EARS, NOSE, AND THROAT: No tinnitus or ear pain.  RESPIRATORY: No cough, shortness of breath, wheezing or hemoptysis.  CARDIOVASCULAR: No chest pain, orthopnea, edema.  GASTROINTESTINAL: No nausea, vomiting, diarrhea or abdominal pain.  GENITOURINARY: No dysuria, hematuria.  ENDOCRINE: No polyuria, nocturia,  HEMATOLOGY: No anemia, easy bruising or bleeding SKIN: No rash or lesion. MUSCULOSKELETAL: No joint pain or arthritis.   NEUROLOGIC: No tingling, numbness, weakness.  PSYCHIATRY: No anxiety or depression.   DRUG ALLERGIES:  No Known Allergies  VITALS:  Blood pressure (!) 129/54, pulse 80, temperature 97.6 F (36.4 C), temperature source Oral, resp. rate 18, height 5' 8"  (1.727 m), weight 60 kg (132 lb 3 oz), SpO2 94 %.  PHYSICAL EXAMINATION:  GENERAL:  75 y.o.-year-old patient lying in the bed with no acute distress.  EYES: Pupils equal, round, reactive to light and accommodation. No scleral icterus. Extraocular muscles intact.  HEENT: Head atraumatic, normocephalic. Oropharynx and nasopharynx clear.  NECK:  Supple, no jugular venous distention. No thyroid enlargement, no tenderness.  LUNGS: Normal breath sounds bilaterally, no wheezing, rales,rhonchi or crepitation. No use of accessory muscles of respiration.  CARDIOVASCULAR: S1, S2 normal. No murmurs, rubs, or gallops.  ABDOMEN: Soft, nontender, nondistended. Bowel sounds present. No organomegaly or mass.  EXTREMITIES: No pedal edema, cyanosis, or clubbing.  NEUROLOGIC: Cranial nerves II through XII are intact. Muscle strength 5/5 in all  extremities. Sensation intact. Gait not checked.  PSYCHIATRIC: The patient is alert and oriented x 3.  SKIN: No obvious rash, lesion, or ulcer.    LABORATORY PANEL:   CBC  Recent Labs Lab 05/09/16 0520  WBC 5.1  HGB 8.3*  HCT 25.7*  PLT 208   ------------------------------------------------------------------------------------------------------------------  Chemistries   Recent Labs Lab 05/09/16 0520  05/11/16 0548  NA 138  < > 136  K 4.8  < > 5.4*  CL 111  < > 110  CO2 24  < > 24  GLUCOSE 95  < > 120*  BUN 32*  < > 39*  CREATININE 1.42*  < > 1.38*  CALCIUM 10.6*  < > 11.2*  AST 27  --   --   ALT 11*  --   --   ALKPHOS 120  --   --   BILITOT 0.6  --   --   < > = values in this interval not displayed. ------------------------------------------------------------------------------------------------------------------  Cardiac Enzymes No results for input(s): TROPONINI in the last 168 hours. ------------------------------------------------------------------------------------------------------------------  RADIOLOGY:  No results found.  EKG:   Orders placed or performed in visit on 06/21/15  . EKG 12-Lead    ASSESSMENT AND PLAN:   #[age] year old male patient with renal transplant in 2006 baseline creatinine 1.9 followed by [facility] nephrology on Prograf, prednisone, mycophenolate comes in as direct admit  because of tumor lysis syndrome.  #1 acute renal failure on CK distress 3: Hold the Myfortic, continue prednisone, tacrolimus, check tacrolimus trough levels, continue aggressive hydration, monitor renal function, urine output, volume status, followed by nephrology,Consult with Dr. Danny Lawless   For his shortness of breath today, we  are not giving her Lasix because of  renal failure/tumor lysis syndrome. Sob;continue to monitor ,check chest xray. , #2 . Tumor lysis syndrome;continue iv fluids; nephrology follow up  #3 hypercalcemia with hyperparathyroidism due to  malignancy continue IV hydration  #4./Posttransplant lymphoproliferative disorder; stage IV B diffuse large B-cell lymphoma'patient had a bone marrow aspiration yesterday. Echo showed EF 55-60%. His first chemotherapy yesterday, monitor for another 24 hours at least on IV hydration to  Prevent  Worsening of tumor lysis syndrome. Bone marrow biopsy result shows MONOMORPHIC POSTTRANSPLANT LYMPHOPROLIFERATIVE DISORDER WITH FEATURES  OF DIFFUSE LARGE B-CELL LYMPHOMA (DLBCL), CONSISTENT WITH GERMINAL  CENTER B.  D/w RN,nephrology and pt and pts wife,   All the records are reviewed and case discussed with Care Management/Social Workerr. Management plans discussed with the patient, family and they are in agreement.  CODE STATUS: full TOTAL TIME TAKING CARE OF THIS PATIENT: 35 minutes.   POSSIBLE D/C IN 1-2 DAYS, DEPENDING ON CLINICAL CONDITION.   Epifanio Lesches M.D on 05/11/2016 at 10:09 AM  Between 7am to 6pm - Pager - [phone]  After 6pm go to [email or web address] - password [email or web address] [facility]  Office  [phone]  CC: Primary care physician; Enid Derry, MD   Note: This dictation was prepared with Dragon dictation along with smaller phrase technology. Any transcriptional errors that result from this process are unintentional.

## 2016-05-12 LAB — BASIC METABOLIC PANEL
Anion gap: 3 — ABNORMAL LOW (ref 5–15)
BUN: 47 mg/dL — ABNORMAL HIGH (ref 6–20)
CO2: 25 mmol/L (ref 22–32)
Calcium: 11.5 mg/dL — ABNORMAL HIGH (ref 8.9–10.3)
Chloride: 108 mmol/L (ref 101–111)
Creatinine, Ser: 1.19 mg/dL (ref 0.61–1.24)
GFR calc Af Amer: >60 mL/min (ref >60)
GFR calc non Af Amer: 58 mL/min — ABNORMAL LOW (ref >60)
Glucose, Bld: 168 mg/dL — ABNORMAL HIGH (ref 65–99)
Potassium: 5.1 mmol/L (ref 3.5–5.1)
Sodium: 136 mmol/L (ref 135–145)

## 2016-05-12 LAB — URIC ACID: Uric Acid, Serum: 8.2 mg/dL — ABNORMAL HIGH (ref 4.4–7.6)

## 2016-05-12 MED ORDER — SODIUM CHLORIDE 0.45 % IV SOLN
INTRAVENOUS | Status: AC
  Administered 2016-05-12: 16:00:00 via INTRAVENOUS

## 2016-05-12 MED ORDER — ALLOPURINOL 100 MG PO TABS
200.0000 mg | ORAL_TABLET | Freq: Every day | ORAL | Status: DC
  Administered 2016-05-13: 200 mg via ORAL
  Filled 2016-05-12: qty 2

## 2016-05-12 NOTE — Progress Notes (Addendum)
[place] at [place] NAME: Lyndol Vanderheiden    MR#:  [id number]  DATE OF BIRTH:  1941-04-02     CHIEF COMPLAINT:   Patient is resting comfortably. Wife at bedside. No complaints today. In good spirits REVIEW OF SYSTEMS:   ROS CONSTITUTIONAL: No fever, fatigue or weakness.  EYES: No blurred or double vision.  EARS, NOSE, AND THROAT: No tinnitus or ear pain.  RESPIRATORY: No cough, shortness of breath, wheezing or hemoptysis.  CARDIOVASCULAR: No chest pain, orthopnea, edema.  GASTROINTESTINAL: No nausea, vomiting, diarrhea or abdominal pain.  GENITOURINARY: No dysuria, hematuria.  ENDOCRINE: No polyuria, nocturia,  HEMATOLOGY: No anemia, easy bruising or bleeding SKIN: No rash or lesion. MUSCULOSKELETAL: No joint pain or arthritis.   NEUROLOGIC: No tingling, numbness, weakness.  PSYCHIATRY: No anxiety or depression.   DRUG ALLERGIES:  No Known Allergies  VITALS:  Blood pressure 132/62, pulse 65, temperature 98.6 F (37 C), temperature source Oral, resp. rate 18, height 5' 8"  (1.727 m), weight 59.7 kg (131 lb 9 oz), SpO2 95 %.  PHYSICAL EXAMINATION:  GENERAL:  75 y.o.-year-old patient lying in the bed with no acute distress.  EYES: Pupils equal, round, reactive to light and accommodation. No scleral icterus. Extraocular muscles intact.  HEENT: Head atraumatic, normocephalic. Oropharynx and nasopharynx clear.  NECK:  Supple, no jugular venous distention. No thyroid enlargement, no tenderness.  LUNGS: Normal breath sounds bilaterally, no wheezing, rales,rhonchi or crepitation. No use of accessory muscles of respiration.  CARDIOVASCULAR: S1, S2 normal. No murmurs, rubs, or gallops.  ABDOMEN: Soft, nontender, nondistended. Bowel sounds present.  EXTREMITIES: No pedal edema, cyanosis, or clubbing.  NEUROLOGIC: Cranial nerves II through XII are intact. Muscle strength 5/5 in all extremities. Sensation intact. Gait not checked.   PSYCHIATRIC: The patient is alert and oriented x 3.  SKIN: No obvious rash, lesion, or ulcer.    LABORATORY PANEL:   CBC  Recent Labs Lab 05/09/16 0520  WBC 5.1  HGB 8.3*  HCT 25.7*  PLT 208   ------------------------------------------------------------------------------------------------------------------  Chemistries   Recent Labs Lab 05/09/16 0520  05/12/16 1220  NA 138  < > 136  K 4.8  < > 5.1  CL 111  < > 108  CO2 24  < > 25  GLUCOSE 95  < > 168*  BUN 32*  < > 47*  CREATININE 1.42*  < > 1.19  CALCIUM 10.6*  < > 11.5*  AST 27  --   --   ALT 11*  --   --   ALKPHOS 120  --   --   BILITOT 0.6  --   --   < > = values in this interval not displayed. ------------------------------------------------------------------------------------------------------------------  Cardiac Enzymes No results for input(s): TROPONINI in the last 168 hours. ------------------------------------------------------------------------------------------------------------------  RADIOLOGY:  Dg Chest 1 View  Result Date: 05/11/2016 CLINICAL DATA:  Low O2 sats.  Shortness of breath. EXAM: CHEST 1 VIEW COMPARISON:  04/30/2016 FINDINGS: Cardiomegaly. Vascular congestion and interstitial prominence throughout the lungs, likely mild interstitial edema. Low lung volumes with bibasilar atelectasis or infiltrates, right greater than left. Small effusions. Overall aeration slightly worsened since prior study. Right Port-A-Cath in place with the tip at the cavoatrial junction. IMPRESSION: Cardiomegaly with interstitial prominence, likely mild interstitial edema. Low lung volumes with bibasilar atelectasis or infiltrates, right greater the left and small effusions. Electronically Signed   By: Rolm Baptise M.D.   On: 05/11/2016 10:45    EKG:  Orders placed or performed in visit on 06/21/15  . EKG 12-Lead    ASSESSMENT AND PLAN:   #75.75 year old male patient with renal transplant in 2006 baseline  creatinine 1.9 followed by [facility] nephrology on Prograf, prednisone, mycophenolate comes in as direct admit  because of tumor lysis syndrome.  #1 acute renal failure on CKD3:  With hyperkalmi Creatinine is getting better 1.19 today  potassium 5.1 Repeat BMP in a.m. and possible discharge in a.m. if renal function continues to improve Appreciate nephrology recommendations Hold the Myfortic, continue prednisone Continue tacrolimus, check tacrolimus trough levels,  Status post IV fluids  monitor renal function, urine output, volume status  #2 . Tumor lysis syndrome;continue iv fluids; nephrology follow up  on allopurinol Uric acid at 8.2   #3 hypercalcemia with hyperparathyroidism due to malignancy continue IV hydration Calcium 11.5, follow up with oncology regarding Zometa  #4./Posttransplant lymphoproliferative disorder;  stage IV B diffuse large B-cell lymphoma'patient had a bone marrow aspiration .  Echo showed EF 55-60%. Had first chemotheraPY, monitor for another 24 hours  to  Prevent  Worsening of tumor lysis syndrome. Bone marrow biopsy result shows MONOMORPHIC POSTTRANSPLANT  LYMPHOPROLIFERATIVE DISORDER WITH FEATURES  OF DIFFUSE LARGE B-CELL LYMPHOMA (DLBCL), CONSISTENT WITH GERMINAL  CENTER B.  D/w RN,nephrology, dr.Corcorane and pt and pts wife,   All the records are reviewed and case discussed with Care Management/Social Workerr. Management plans discussed with the patient, family and they are in agreement.  CODE STATUS: full TOTAL TIME TAKING CARE OF THIS PATIENT: 35 minutes.   POSSIBLE D/C IN 1-2 DAYS, DEPENDING ON CLINICAL CONDITION.   Nicholes Mango M.D on 05/12/2016 at 1:44 PM  Between 7am to 6pm - Pager - [phone]  After 6pm go to [email or web address] - password [email or web address] [place] [facility]  Office  [phone]  CC: Primary care physician; Enid Derry, MD   Note: This dictation was prepared with Dragon dictation along with smaller phrase  technology. Any transcriptional errors that result from this process are unintentional.

## 2016-05-12 NOTE — Progress Notes (Signed)
[facility] Hematology/Oncology Progress Note  Date of admission: 05/06/2016  Hospital day:  05/12/2016  Chief Complaint: Matthew Brown is a 75 y.o. male with post transplant lymphoproliferative disorder (PTLD), stage IVB diffuse large B cell lymphoma who was admitted with tumor lysis syndrome.  Subjective:  Denies any complaint.  He is eating well.  He denies any nausea.  Social History: The patient is alone today.  Allergies: No Known Allergies  Scheduled Medications: . allopurinol  100 mg Oral Daily  . docusate sodium  100 mg Oral BID  . enoxaparin (LOVENOX) injection  40 mg Subcutaneous Q24H  . feeding supplement (ENSURE ENLIVE)  237 mL Oral TID BM  . finasteride  5 mg Oral Daily  . gentamicin irrigation   Irrigation Once  . metoprolol tartrate  25 mg Oral BID  . multivitamin with minerals  1 tablet Oral Daily  . oxybutynin  5 mg Oral Daily  . pantoprazole  40 mg Oral Daily  . predniSONE  70 mg Oral Q breakfast  . sodium chloride flush  3 mL Intravenous Q12H  . sucralfate  1 g Oral QID  . tacrolimus  3 mg Oral BID  . tamsulosin  0.4 mg Oral Daily    Review of Systems: GENERAL:  Feels good. No fevers or sweats.  Weight loss. PERFORMANCE STATUS (ECOG):  1 Lungs: No shortness of breath or cough. No hemoptysis. Cardiac: No chest pain, palpitations, orthopnea, or PND. GI: No nausea, vomiting, diarrhea, constipation, melena or hematochezia. GU: Enlarged prostate. No urgency, frequency, dysuria, or hematuria. Musculoskeletal: Back pain and left hip pain. No joint pain. No muscle tenderness. Extremities: No pain or swelling. Skin: No rashes or skin changes. Neuro: No numbness or weakness, balance or coordination issues. Endocrine: No diabetes, thyroid issues, hot flashes or night sweats. Psych: No mood changes, depression or anxiety. Pain: No pain. Review of systems: All other systems reviewed and found to be negative.  Physical  Exam: Blood pressure (!) 129/50, pulse (!) 54, temperature 97.7 F (36.5 C), temperature source Oral, resp. rate 20, height 5' 8"  (1.727 m), weight 131 lb 9 oz (59.7 kg), SpO2 98 %.  GENERAL:Thin elderly gentleman lying comfortably on the medical unit in no acute distress. MENTAL STATUS: Alert and oriented to person, place and time. HEAD:Gray goatee. Normocephalic, atraumatic, face symmetric, no Cushingoid features. EYES:Glasses. Brown eyes. Pupils equal round and reactive to light and accomodation. No conjunctivitis or scleral icterus. OYD:XAJOINOMVE clear without lesion. Edentulous. Tonguenormal. Mucous membranes dry. RESPIRATORY:Clear to auscultationwithout rales, wheezes or rhonchi. CARDIOVASCULAR:Regular rate andrhythmwithout murmur, rub or gallop. CHEST:  Right sided port-a-cath accessed. ABDOMEN:Soft, non-tender, with active bowel sounds, and no hepatosplenomegaly. No masses.  SKIN: No rashes, ulcers or lesions. EXTREMITIES: No edema, no skin discoloration or tenderness. No palpable cords. NEUROLOGICAL: Unremarkable. PSYCH: Appropriate.   Results for orders placed or performed during the hospital encounter of 05/06/16 (from the past 48 hour(s))  Basic metabolic panel     Status: Abnormal   Collection Time: 05/11/16  5:48 AM  Result Value Ref Range   Sodium 136 135 - 145 mmol/L   Potassium 5.4 (H) 3.5 - 5.1 mmol/L   Chloride 110 101 - 111 mmol/L   CO2 24 22 - 32 mmol/L   Glucose, Bld 120 (H) 65 - 99 mg/dL   BUN 39 (H) 6 - 20 mg/dL   Creatinine, Ser 1.38 (H) 0.61 - 1.24 mg/dL   Calcium 11.2 (H) 8.9 - 10.3 mg/dL   GFR calc  non Af Amer 48 (L) >60 mL/min   GFR calc Af Amer 56 (L) >60 mL/min    Comment: (NOTE) The eGFR has been calculated using the CKD EPI equation. This calculation has not been validated in all clinical situations. eGFR's persistently <60 mL/min signify possible Chronic Kidney Disease.    Anion gap 2 (L) 5 - 15  Uric acid      Status: Abnormal   Collection Time: 05/11/16  5:48 AM  Result Value Ref Range   Uric Acid, Serum 8.0 (H) 4.4 - 7.6 mg/dL  Type and screen Trion     Status: None   Collection Time: 05/11/16  5:48 AM  Result Value Ref Range   ABO/RH(D) O POS    Antibody Screen NEG    Sample Expiration 68/61/6837   Basic metabolic panel     Status: Abnormal   Collection Time: 05/12/16 12:20 PM  Result Value Ref Range   Sodium 136 135 - 145 mmol/L   Potassium 5.1 3.5 - 5.1 mmol/L   Chloride 108 101 - 111 mmol/L   CO2 25 22 - 32 mmol/L   Glucose, Bld 168 (H) 65 - 99 mg/dL   BUN 47 (H) 6 - 20 mg/dL   Creatinine, Ser 1.19 0.61 - 1.24 mg/dL   Calcium 11.5 (H) 8.9 - 10.3 mg/dL   GFR calc non Af Amer 58 (L) >60 mL/min   GFR calc Af Amer >60 >60 mL/min    Comment: (NOTE) The eGFR has been calculated using the CKD EPI equation. This calculation has not been validated in all clinical situations. eGFR's persistently <60 mL/min signify possible Chronic Kidney Disease.    Anion gap 3 (L) 5 - 15  Uric acid     Status: Abnormal   Collection Time: 05/12/16 12:20 PM  Result Value Ref Range   Uric Acid, Serum 8.2 (H) 4.4 - 7.6 mg/dL   Dg Chest 1 View  Result Date: 05/11/2016 CLINICAL DATA:  Low O2 sats.  Shortness of breath. EXAM: CHEST 1 VIEW COMPARISON:  04/30/2016 FINDINGS: Cardiomegaly. Vascular congestion and interstitial prominence throughout the lungs, likely mild interstitial edema. Low lung volumes with bibasilar atelectasis or infiltrates, right greater than left. Small effusions. Overall aeration slightly worsened since prior study. Right Port-A-Cath in place with the tip at the cavoatrial junction. IMPRESSION: Cardiomegaly with interstitial prominence, likely mild interstitial edema. Low lung volumes with bibasilar atelectasis or infiltrates, right greater the left and small effusions. Electronically Signed   By: Rolm Baptise M.D.   On: 05/11/2016 10:45    Assessment:  Matthew Brown is a 75 y.o. male with stage IVBE diffuse large B cell lymphoma. He has a post-transplant lymphoproliferative disorder (PTLD).  He is day 4 s/p cycle #1 mini-RCHOP.  He is s/p renal transplant.  He has tumor lysis syndrome, improving with hydration and allopurinol.  Plan:   1. Oncology:  Patient had port-a-cath placed (05/07/2016) and bone marrow aspirate and biopsy (05/08/2016).  Echo on 05/06/2016 revealed an EF of 55-60%.  Hepatitis testing negative.  Patient presented at tumor board on 05/08/2016.    Patient is day 4 s/p cycle #1 mini-RCHOP.  Patient is day 3 of 5 prednisone 70 mg a day.  Patient's baseline prednisone dose of 5 mg a day will be held until his 5 day burst of steroids complete.  Tumor lysis syndrome well managed.  Fluids and allopurinol continue.  G6PD assay normal (patient may receive rasburicase if needed).  2.  Hematology:  Anemia work-up on 04/15/2016 revealed an iron saturation of 6% and TIBC 241 c/w chronic disease.  Ferritin was 343.  B12 and folate were normal.  Reticulocyte count was 2% (low for level of anemia).  Await bone marrow to assess for lymphoma.  Check CBC in AM.  If patient requires blood products, they need to be leukopoor and irradiated.  3.  Nephrology:  Patient is s/p renal transplant on Prograf and prednisone.  MMF was discontinued.  Hydration held then restarted.  Prograf level 5.5 (3.0-8.0) on 05/08/2016.  Uric acid 8.2.  Increase allopurinol to 200 mg a day.  Creatinine improved to 1.19.  Calcium creeping up (check ionized calcium).  Nephrology notes considering cinacalcet.  Will discuss possible Zometa. Appreciate nephrology conslt.  4.  Code status:  Discussed with patient and his wife.  Full Code.  5.  Disposition:  Possible discharge tomorrow.  Anticipate follow-up in the oncology clinic on 05/14/2016.   Lequita Asal, MD  05/12/2016

## 2016-05-12 NOTE — Care Management Important Message (Signed)
Important Message  Patient Details  Name: Matthew Brown MRN: [id number] Date of Birth: September 28, 1940   Medicare Important Message Given:  Yes    Shelbie Ammons, RN 05/12/2016, 11:16 AM

## 2016-05-12 NOTE — Progress Notes (Signed)
[facility]  ROUNDING NOTE   Subjective:   Wife at bedside.  Feels well.  No shortness of breath.  No leg edema. Lab results show today creatinine has improved to 1.19, BUN is high at 47, potassium 5.1. Calcium remains high at 11.5  Objective:  Vital signs in last 24 hours:  Temp:  [97.7 F (36.5 C)-98.6 F (37 C)] 97.7 F (36.5 C) (11/20 1413) Pulse Rate:  [45-65] 54 (11/20 1413) Resp:  [18-20] 20 (11/20 1413) BP: (129-152)/(47-67) 129/50 (11/20 1413) SpO2:  [95 %-100 %] 98 % (11/20 1413) Weight:  [59.7 kg (131 lb 9 oz)] 59.7 kg (131 lb 9 oz) (11/20 0536)  Weight change: 0.096 kg (3.4 oz) Filed Weights   05/10/16 2300 05/11/16 1106 05/12/16 0536  Weight: 60 kg (132 lb 3 oz) 60.1 kg (132 lb 6.4 oz) 59.7 kg (131 lb 9 oz)    Intake/Output: I/O last 3 completed shifts: In: 6 [P.O.:956] Out: 500 [Urine:500]   Intake/Output this shift:  Total I/O In: 240 [P.O.:240] Out: 2 [Urine:1; Stool:1]  Physical Exam: General: NAD, cachectic, laying in bed  Head: Normocephalic, atraumatic. Moist oral mucosal membranes  Eyes: Anicteric,  Neck: Supple, trachea midline  Lungs:  Clear b/l  Heart: Regular rate and rhythm  Abdomen:  Soft, nontender, right lower quadrant nontender allograft kidney  Extremities:  no peripheral edema.  Neurologic: Nonfocal, moving all four extremities  Skin: No lesions  Access: Right forearm AVF +bruit and +thrill    Basic Metabolic Panel:  Recent Labs Lab 05/08/16 0515 05/09/16 0520 05/10/16 0555 05/11/16 0548 05/12/16 1220  NA 134* 138 136 136 136  K 4.7 4.8 5.2* 5.4* 5.1  CL 104 111 112* 110 108  CO2 25 24 21* 24 25  GLUCOSE 100* 95 84 120* 168*  BUN 43* 32* 29* 39* 47*  CREATININE 1.47* 1.42* 1.26* 1.38* 1.19  CALCIUM 10.3 10.6* 10.7* 11.2* 11.5*  PHOS 2.5 2.0*  --   --   --     Liver Function Tests:  Recent Labs Lab 05/08/16 0515 05/09/16 0520  AST  --  27  ALT  --  11*  ALKPHOS  --  120  BILITOT  --  0.6   PROT  --  5.3*  ALBUMIN 2.6* 2.6*   No results for input(s): LIPASE, AMYLASE in the last 168 hours. No results for input(s): AMMONIA in the last 168 hours.  CBC:  Recent Labs Lab 05/06/16 1147 05/07/16 0517 05/08/16 0020 05/09/16 0520  WBC 6.5 5.0 4.3 5.1  NEUTROABS 5.7  --  3.6 4.2  HGB 9.3* 8.4* 8.7* 8.3*  HCT 27.9* 25.2* 26.5* 25.7*  MCV 73.5* 74.3* 73.9* 74.5*  PLT 248 213 224 208    Cardiac Enzymes: No results for input(s): CKTOTAL, CKMB, CKMBINDEX, TROPONINI in the last 168 hours.  BNP: Invalid input(s): POCBNP  CBG: No results for input(s): GLUCAP in the last 168 hours.  Microbiology: Results for orders placed or performed in visit on 10/09/15  Microscopic Examination     Status: Abnormal   Collection Time: 10/09/15  8:49 AM  Result Value Ref Range Status   WBC, UA 6-10 (A) 0 - 5 /hpf Final   RBC, UA 0-2 0 - 2 /hpf Final   Epithelial Cells (non renal) 0-10 0 - 10 /hpf Final   Bacteria, UA None seen None seen/Few Final  CULTURE, URINE COMPREHENSIVE     Status: Abnormal   Collection Time: 10/09/15  8:49 AM  Result Value  Ref Range Status   Urine Culture, Comprehensive Final report (A)  Final   Result 1 Comment (A)  Final    Comment: Vancomycin-resistant Enterococcus (Enterococcus faecalis) 400 Colonies/mL    ANTIMICROBIAL SUSCEPTIBILITY Comment  Final    Comment:       ** S = Susceptible; I = Intermediate; R = Resistant **                    P = Positive; N = Negative             MICS are expressed in micrograms per mL    Antibiotic                 RSLT#1    RSLT#2    RSLT#3    RSLT#4 Ciprofloxacin                  R Levofloxacin                   R Linezolid                      S Nitrofurantoin                 S Penicillin                     S Tetracycline                   R Vancomycin                     R   GC/Chlamydia Probe Amp     Status: None   Collection Time: 10/09/15  8:53 AM  Result Value Ref Range Status   Chlamydia trachomatis, NAA  Negative Negative Final   Neisseria gonorrhoeae by PCR Negative Negative Final    Coagulation Studies: No results for input(s): LABPROT, INR in the last 72 hours.  Urinalysis: No results for input(s): COLORURINE, LABSPEC, PHURINE, GLUCOSEU, HGBUR, BILIRUBINUR, KETONESUR, PROTEINUR, UROBILINOGEN, NITRITE, LEUKOCYTESUR in the last 72 hours.  Invalid input(s): APPERANCEUR    Imaging: Dg Chest 1 View  Result Date: 05/11/2016 CLINICAL DATA:  Low O2 sats.  Shortness of breath. EXAM: CHEST 1 VIEW COMPARISON:  04/30/2016 FINDINGS: Cardiomegaly. Vascular congestion and interstitial prominence throughout the lungs, likely mild interstitial edema. Low lung volumes with bibasilar atelectasis or infiltrates, right greater than left. Small effusions. Overall aeration slightly worsened since prior study. Right Port-A-Cath in place with the tip at the cavoatrial junction. IMPRESSION: Cardiomegaly with interstitial prominence, likely mild interstitial edema. Low lung volumes with bibasilar atelectasis or infiltrates, right greater the left and small effusions. Electronically Signed   By: Rolm Baptise M.D.   On: 05/11/2016 10:45     Medications:    . allopurinol  100 mg Oral Daily  . docusate sodium  100 mg Oral BID  . enoxaparin (LOVENOX) injection  40 mg Subcutaneous Q24H  . feeding supplement (ENSURE ENLIVE)  237 mL Oral TID BM  . finasteride  5 mg Oral Daily  . gentamicin irrigation   Irrigation Once  . metoprolol tartrate  25 mg Oral BID  . multivitamin with minerals  1 tablet Oral Daily  . oxybutynin  5 mg Oral Daily  . pantoprazole  40 mg Oral Daily  . predniSONE  70 mg Oral Q breakfast  . sodium chloride flush  3 mL Intravenous Q12H  . sucralfate  1  g Oral QID  . tacrolimus  3 mg Oral BID  . tamsulosin  0.4 mg Oral Daily   acetaminophen **OR** acetaminophen, albuterol, diphenhydrAMINE, HYDROcodone-acetaminophen, lactulose, ondansetron **OR** ondansetron (ZOFRAN) IV, polyethylene glycol,  polyvinyl alcohol, polyvinyl alcohol, sodium chloride flush  Assessment/ Plan:  [name] is a 75 y.o. black male with renal transplant 2006 with baseline creatinine of 1.6-1.9 followed by [facility], Dr. Suzan Nailer on prograf, myfortic and prednisone. Patient with past medical history of pulmonary hyeprtension, atrial fibrillation, hypertension, anemia, hyperlipidemia, gout, GERD, BPH, history of GI bleed , who was admitted to [facility] on 05/06/2016 for diffuse large B cell lymphoma with hyponatremia, hypercalcemia, hyperuricemia And chronic kidney disease on renal transplant.   1. Acute renal failure on chronic kidney disease stage IIIT: renal transplant in 2006 with baseline creatinine of 1.6-1.9. Holding myfortic. Continues on tacrolimus and prednisone.  - Checking tacrolimus trough level in Am of 11/20 - Monitor renal function, urine output and volume status.  - d/c fluid restriction   2. Hyperkalemia - change to low K diet.   3. Hypercalemia with hyperparathyroidism: consistent with primary but with renal insufficiency. Secondary to malignancy. Calcium improved.  - iv 1/2 NS 500 cc x 1 - Low threshold to start cinacalcet - PTH 106 on NOV 8  4. Tumor lysis syndrome: uric acid 9.2 on admission. G6PD negative. Uric acid 8.2 -  Allopurinol -  Appreciate hematology input.      LOS: 6 Berlene Dixson 11/20/20173:51 PM

## 2016-05-13 ENCOUNTER — Ambulatory Visit: Payer: Commercial Managed Care - HMO | Admitting: Family Medicine

## 2016-05-13 ENCOUNTER — Encounter: Payer: Self-pay | Admitting: Diagnostic Radiology

## 2016-05-13 ENCOUNTER — Other Ambulatory Visit: Payer: Self-pay | Admitting: *Deleted

## 2016-05-13 DIAGNOSIS — C8330 Diffuse large B-cell lymphoma, unspecified site: Secondary | ICD-10-CM

## 2016-05-13 LAB — CBC WITH DIFFERENTIAL/PLATELET
Basophils Absolute: 0 10*3/uL (ref 0–0.1)
Basophils Relative: 0 %
Eosinophils Absolute: 0 10*3/uL (ref 0–0.7)
Eosinophils Relative: 0 %
HCT: 24.7 % — ABNORMAL LOW (ref 40.0–52.0)
Hemoglobin: 8 g/dL — ABNORMAL LOW (ref 13.0–18.0)
Lymphocytes Relative: 4 %
Lymphs Abs: 0.2 10*3/uL — ABNORMAL LOW (ref 1.0–3.6)
MCH: 24.4 pg — ABNORMAL LOW (ref 26.0–34.0)
MCHC: 32.5 g/dL (ref 32.0–36.0)
MCV: 75 fL — ABNORMAL LOW (ref 80.0–100.0)
Monocytes Absolute: 0.1 10*3/uL — ABNORMAL LOW (ref 0.2–1.0)
Monocytes Relative: 3 %
Neutro Abs: 4 10*3/uL (ref 1.4–6.5)
Neutrophils Relative %: 93 %
Platelets: 185 10*3/uL (ref 150–440)
RBC: 3.3 MIL/uL — ABNORMAL LOW (ref 4.40–5.90)
RDW: 19.8 % — ABNORMAL HIGH (ref 11.5–14.5)
WBC: 4.3 10*3/uL (ref 3.8–10.6)

## 2016-05-13 LAB — BASIC METABOLIC PANEL
Anion gap: 3 — ABNORMAL LOW (ref 5–15)
BUN: 54 mg/dL — ABNORMAL HIGH (ref 6–20)
CO2: 24 mmol/L (ref 22–32)
Calcium: 11.2 mg/dL — ABNORMAL HIGH (ref 8.9–10.3)
Chloride: 110 mmol/L (ref 101–111)
Creatinine, Ser: 1.21 mg/dL (ref 0.61–1.24)
GFR calc Af Amer: >60 mL/min (ref >60)
GFR calc non Af Amer: 56 mL/min — ABNORMAL LOW (ref >60)
Glucose, Bld: 156 mg/dL — ABNORMAL HIGH (ref 65–99)
Potassium: 5.9 mmol/L — ABNORMAL HIGH (ref 3.5–5.1)
Sodium: 137 mmol/L (ref 135–145)

## 2016-05-13 LAB — POTASSIUM: Potassium: 4.9 mmol/L (ref 3.5–5.1)

## 2016-05-13 LAB — URIC ACID: Uric Acid, Serum: 8.1 mg/dL — ABNORMAL HIGH (ref 4.4–7.6)

## 2016-05-13 MED ORDER — SODIUM POLYSTYRENE SULFONATE 15 GM/60ML PO SUSP
30.0000 g | ORAL | Status: AC
Start: 2016-05-13 — End: 2016-05-13
  Administered 2016-05-13: 30 g via ORAL
  Filled 2016-05-13: qty 120

## 2016-05-13 MED ORDER — DOCUSATE SODIUM 100 MG PO CAPS: 100.0000 mg | ORAL_CAPSULE | Freq: Two times a day (BID) | ORAL | 0 refills | Status: DC

## 2016-05-13 MED ORDER — BOOST / RESOURCE BREEZE PO LIQD: 1.0000 | Freq: Three times a day (TID) | ORAL | 0 refills | Status: DC

## 2016-05-13 MED ORDER — DIPHENHYDRAMINE HCL 25 MG PO CAPS: 25.0000 mg | ORAL_CAPSULE | Freq: Every evening | ORAL | 0 refills | Status: DC | PRN

## 2016-05-13 MED ORDER — ACETAMINOPHEN 325 MG PO TABS: 650.0000 mg | ORAL_TABLET | Freq: Four times a day (QID) | ORAL | Status: AC | PRN

## 2016-05-13 MED ORDER — BOOST / RESOURCE BREEZE PO LIQD
1.0000 | Freq: Three times a day (TID) | ORAL | Status: DC
  Administered 2016-05-13: 13:00:00 1 via ORAL

## 2016-05-13 MED ORDER — HEPARIN SOD (PORK) LOCK FLUSH 100 UNIT/ML IV SOLN
500.0000 [IU] | Freq: Once | INTRAVENOUS | Status: AC
  Administered 2016-05-13: 500 [IU] via INTRAVENOUS
  Filled 2016-05-13: qty 5

## 2016-05-13 MED ORDER — ENSURE ENLIVE PO LIQD: 1.0000 | Freq: Three times a day (TID) | ORAL | 0 refills | Status: DC

## 2016-05-13 MED ORDER — POLYETHYLENE GLYCOL 3350 17 G PO PACK: 17.0000 g | PACK | Freq: Every day | ORAL | 0 refills | Status: DC | PRN

## 2016-05-13 MED ORDER — SODIUM CHLORIDE 0.9 % IV SOLN
4.0000 mg | Freq: Once | INTRAVENOUS | Status: AC
  Administered 2016-05-13 (×2): 4 mg via INTRAVENOUS
  Filled 2016-05-13 (×2): qty 5

## 2016-05-13 NOTE — Plan of Care (Signed)
Problem: Health Behavior/Discharge Planning: Goal: Ability to manage health-related needs will improve Outcome: Progressing zometa  Dose given ca 11.2  Kayexalate given k 5.9 prior and  4.9 after med  Problem: Physical Regulation: Goal: Ability to maintain clinical measurements within normal limits will improve Outcome: Progressing Up with assist to br  Problem: Bowel/Gastric: Goal: Will not experience complications related to bowel motility Outcome: Progressing Stool from kayexalate

## 2016-05-13 NOTE — Discharge Summary (Signed)
[facility] [place] NAME: Matthew Brown    MR#:  [id number]  DATE OF BIRTH:  10-08-1940  DATE OF ADMISSION:  05/06/2016 ADMITTING PHYSICIAN: Dustin Flock, MD  DATE OF DISCHARGE: 05/13/16  PRIMARY CARE PHYSICIAN: Enid Derry, MD    ADMISSION DIAGNOSIS:  diffuse large B cell lymphoma hyponatremia hypercalcemia hyperuricemia renal transplant insufficincey poor venous access  DISCHARGE DIAGNOSIS:  Active Problems:   Tumor lysis syndrome   Protein-calorie malnutrition, severe  Hyperkalemia Hypercalcemia SECONDARY DIAGNOSIS:   Past Medical History:  Diagnosis Date  . Benign prostatic hypertrophy   . Chronic headache 10/19/2015  . ED (erectile dysfunction)   . End stage renal disease ([place])   . Essential hypertension   . GERD (gastroesophageal reflux disease)   . GIB (gastrointestinal bleeding)    a. 0/5397 s/p R colic artery embolization;  b. 02/2015 EGD: duod ulcerative mass->Bx notable for coagulative necrosis - ? ischemia vs thrombosis-->coumadin d/c'd.  . Gout   . Hearing loss   . Hemorrhoids   . Hyperlipidemia   . Osteoarthrosis, unspecified whether generalized or localized, lower leg   . Persistent atrial fibrillation ([place])    a. CHA2DS2VASc = 3-->coumadin d/c'd 02/2015 2/2 recurrent GIB.  [name] Prostatitis   . Pulmonary hypertension    a. 10/2014 Echo: EF 60-65%, mild to mod MR, mildly dil LA, nl RV, PASP 67mHg.  .Marland KitchenRenal transplant recipient   . Ulcers of both great toes ([facility]     HOSPITAL COURSE:  HISTORY OF PRESENT ILLNESS: Matthew Brown is a 75y.o. male with a known history of hospitalized with spontaneous pneumothorax. Patient was conservatively treated without any intervention and discharged home. He does have a history of having multiple abnormal malady on his CT and was followed up by oncology and had a biopsy of the lymph node which revealed diffuse large B-cell lymphoma. He went to follow-up with oncology  regarding further therapy and options. He was noted to have elevated uric acid and was noted to have elevated calcium and PTH level. He is being admitted for IV fluids and further management for possible tumor lysis syndrome. Please review history and physical for details  Hospital course  #[age] old male patient with renal transplant in 2006 baseline creatinine 1.9 [facility], mycophenolate comes in as direct admit  because of tumor lysis syndrome.   #1 acute renal failure on CKD3:  With hyperkalmia Creatinine is getting better 1.19 -1.21  potassium 5.1-5.9-4.9 after Kayexalate today Appreciate nephrology recommendations Hold the Myfortic, continue prednisone Continue tacrolimus, check tacrolimus trough levels,  Status post IV fluids  monitor renal function, urine output, volume status  #2 . Tumor lysis syndrome;continue iv fluids; nephrology follow up  on allopurinol Uric acid at 8.2   #3 hypercalcemia with hyperparathyroidism due to malignancy continue IV hydration Calcium 11.2 Iv  Zometa once today before discharge Nephrology and oncology are agreeable  #4./Posttransplant lymphoproliferative disorder;  stage IV B diffuse large B-cell lymphoma'patient had a bone marrow aspiration .  Echo showed EF 55-60%. Had first chemotheraPY, monitored closely  Worsening of tumor lysis syndrome. Bone marrow biopsy result shows MONOMORPHIC POSTTRANSPLANT  LYMPHOPROLIFERATIVE DISORDER WITH FEATURES  OF DIFFUSE LARGE B-CELL LYMPHOMA (DLBCL), CONSISTENT WITH GERMINAL  CENTER B. Outpatient follow-up with oncology tomorrow and on Monday Dr. CMike Gipprimary oncologist to consider  BMP in repeating a.m. when patient follows up with her  #5 hyperkalemia potassium 5.9  Kayexalate given repeat potassium  4.9  D/w RN,nephrology, dr.Corcorane and pt and pts wife,    DISCHARGE CONDITIONS:   Fair  CONSULTS OBTAINED:  Treatment Team:  Lavonia Dana, MD Lloyd Huger, MD Sela Hua, PA-C   PROCEDURES   DRUG ALLERGIES:  No Known Allergies  DISCHARGE MEDICATIONS:   Current Discharge Medication List    START taking these medications   Details  acetaminophen (TYLENOL) 325 MG tablet Take 2 tablets (650 mg total) by mouth every 6 (six) hours as needed for mild pain (or Fever >/= 101).    diphenhydrAMINE (BENADRYL) 25 mg capsule Take 1 capsule (25 mg total) by mouth at bedtime as needed for sleep. Qty: 30 capsule, Refills: 0    docusate sodium (COLACE) 100 MG capsule Take 1 capsule (100 mg total) by mouth 2 (two) times daily. Qty: 10 capsule, Refills: 0    !! feeding supplement (BOOST / RESOURCE BREEZE) LIQD Take 1 Container by mouth 3 (three) times daily between meals. Qty: 90 Container, Refills: 0    !! feeding supplement, ENSURE ENLIVE, (ENSURE ENLIVE) LIQD Take 237 mLs by mouth 3 (three) times daily between meals. Qty: 90 Bottle, Refills: 0    polyethylene glycol (MIRALAX / GLYCOLAX) packet Take 17 g by mouth daily as needed (constipation). Qty: 14 each, Refills: 0     !! - Potential duplicate medications found. Please discuss with provider.    CONTINUE these medications which have NOT CHANGED   Details  albuterol (PROAIR HFA) 108 (90 BASE) MCG/ACT inhaler Inhale into the lungs.    allopurinol (ZYLOPRIM) 100 MG tablet Take 100 mg by mouth daily.      finasteride (PROSCAR) 5 MG tablet Take 5 mg by mouth daily.      furosemide (LASIX) 80 MG tablet     hydroxypropyl methylcellulose (ISOPTO TEARS) 2.5 % ophthalmic solution Place 1 drop into both eyes as needed.     magnesium oxide (MAG-OX) 400 (241.3 Mg) MG tablet TAKE 1 TABLET (400 MG TOTAL) BY MOUTH TWO (2) TIMES A DAY. Refills: 11    metoprolol tartrate (LOPRESSOR) 25 MG tablet TAKE 1 TABLET (25 MG TOTAL) BY MOUTH TWO (2) TIMES A DAY. Refills: 11    Multiple Vitamin (MULTIVITAMIN) tablet Take 1 tablet by mouth daily.      mycophenolate  (MYFORTIC) 180 MG EC tablet Take 2 tablets twice a day, V42.0 kidney transplant, tx date 11/05/04, DAW1, brand medically necessary    omeprazole (PRILOSEC) 20 MG capsule Take 20 mg by mouth daily.     oxybutynin (DITROPAN-XL) 5 MG 24 hr tablet Take 5 mg by mouth daily.    predniSONE (DELTASONE) 5 MG tablet Take 5 mg by mouth daily with breakfast.     sucralfate (CARAFATE) 1 G tablet Take 1 g by mouth 4 (four) times daily. Refills: 3    tacrolimus (PROGRAF) 1 MG capsule Take 3 mg by mouth 2 (two) times daily. Reported on 08/16/2015    Tamsulosin HCl (FLOMAX) 0.4 MG CAPS Take 0.4 mg by mouth daily.      HYDROcodone-acetaminophen (NORCO) 5-325 MG tablet Take 1 tablet by mouth every 4 (four) hours as needed for moderate pain. Qty: 30 tablet, Refills: 0    lactulose (CHRONULAC) 10 GM/15ML solution Take 7.5-15 mLs (5-10 g total) by mouth daily as needed for mild constipation. Qty: 473 mL, Refills: 1         DISCHARGE INSTRUCTIONS:  Follow-up with primary care physician in a week Follow-up with nephrology Dr. Candiss Norse in a  week Follow-up with Dr. Mike Gip on 05/14/2016 and 05/19/2016   DIET:  Regular diet with The diet supplements  DISCHARGE CONDITION:  Fair  ACTIVITY:  Activity as tolerated  OXYGEN:  Home Oxygen: No.   Oxygen Delivery: room air  DISCHARGE LOCATION:  home   If you experience worsening of your admission symptoms, develop shortness of breath, life threatening emergency, suicidal or homicidal thoughts you must seek medical attention immediately by calling 911 or calling your MD immediately  if symptoms less severe.  You Must read complete instructions/literature along with all the possible adverse reactions/side effects for all the Medicines you take and that have been prescribed to you. Take any new Medicines after you have completely understood and accpet all the possible adverse reactions/side effects.   Please note  You were cared for by a hospitalist  during your hospital stay. If you have any questions about your discharge medications or the care you received while you were in the hospital after you are discharged, you can call the unit and asked to speak with the hospitalist on call if the hospitalist that took care of you is not available. Once you are discharged, your primary care physician will handle any further medical issues. Please note that NO REFILLS for any discharge medications will be authorized once you are discharged, as it is imperative that you return to your primary care physician (or establish a relationship with a primary care physician if you do not have one) for your aftercare needs so that they can reassess your need for medications and monitor your lab values.     Today  No chief complaint on file.  Patient is resting comfortably. Wife at bedside. Denies any complaints. Okay to be discharged home from nephrology and oncology standpoint  ROS:  CONSTITUTIONAL: Denies fevers, chills. Denies any fatigue, weakness.  EYES: Denies blurry vision, double vision, eye pain. EARS, NOSE, THROAT: Denies tinnitus, ear pain, hearing loss. RESPIRATORY: Denies cough, wheeze, shortness of breath.  CARDIOVASCULAR: Denies chest pain, palpitations, edema.  GASTROINTESTINAL: Denies nausea, vomiting, diarrhea, abdominal pain. Denies bright red blood per rectum. GENITOURINARY: Denies dysuria, hematuria. ENDOCRINE: Denies nocturia or thyroid problems. HEMATOLOGIC AND LYMPHATIC: Denies easy bruising or bleeding. SKIN: Denies rash or lesion. MUSCULOSKELETAL: Denies pain in neck, back, shoulder, knees, hips or arthritic symptoms.  NEUROLOGIC: Denies paralysis, paresthesias.  PSYCHIATRIC: Denies anxiety or depressive symptoms.   VITAL SIGNS:  Blood pressure (!) 127/46, pulse (!) 58, temperature 97.4 F (36.3 C), temperature source Oral, resp. rate 18, height _0  (1.727 m), weight 60.6 kg (133 lb 8 oz), SpO2 97 %.  I/O:     Intake/Output Summary (Last 24 hours) at 05/13/16 1511 Last data filed at 05/13/16 1454  Gross per 24 hour  Intake           912.17 ml  Output              900 ml  Net            12.17 ml    PHYSICAL EXAMINATION:  GENERAL:  75 y.o.-year-old patient lying in the bed with no acute distress.  EYES: Pupils equal, round, reactive to light and accommodation. No scleral icterus. Extraocular muscles intact.  HEENT: Head atraumatic, normocephalic. Oropharynx and nasopharynx clear.  NECK:  Supple, no jugular venous distention. No thyroid enlargement, no tenderness.  LUNGS: Normal breath sounds bilaterally, no wheezing, rales,rhonchi or crepitation. No use of accessory muscles of respiration.  CARDIOVASCULAR: S1, S2 normal. No murmurs, rubs,  or gallops.  ABDOMEN: Soft, non-tender, non-distended. Bowel sounds present. No organomegaly or mass.  EXTREMITIES: No pedal edema, cyanosis, or clubbing.  NEUROLOGIC: Cranial nerves II through XII are intact. Muscle strength 5/5 in all extremities. Sensation intact. Gait not checked.  PSYCHIATRIC: The patient is alert and oriented x 3.  SKIN: No obvious rash, lesion, or ulcer.   DATA REVIEW:   CBC  Recent Labs Lab 05/13/16 0607  WBC 4.3  HGB 8.0*  HCT 24.7*  PLT 185    Chemistries   Recent Labs Lab 05/09/16 0520  05/13/16 0607 05/13/16 1258  NA 138  < > 137  --   K 4.8  < > 5.9* 4.9  CL 111  < > 110  --   CO2 24  < > 24  --   GLUCOSE 95  < > 156*  --   BUN 32*  < > 54*  --   CREATININE 1.42*  < > 1.21  --   CALCIUM 10.6*  < > 11.2*  --   AST 27  --   --   --   ALT 11*  --   --   --   ALKPHOS 120  --   --   --   BILITOT 0.6  --   --   --   < > = values in this interval not displayed.  Cardiac Enzymes No results for input(s): TROPONINI in the last 168 hours.  Microbiology Results  Results for orders placed or performed in visit on 10/09/15  Microscopic Examination     Status: Abnormal   Collection Time: 10/09/15  8:49 AM   Result Value Ref Range Status   WBC, UA 6-10 (A) 0 - 5 /hpf Final   RBC, UA 0-2 0 - 2 /hpf Final   Epithelial Cells (non renal) 0-10 0 - 10 /hpf Final   Bacteria, UA None seen None seen/Few Final  CULTURE, URINE COMPREHENSIVE     Status: Abnormal   Collection Time: 10/09/15  8:49 AM  Result Value Ref Range Status   Urine Culture, Comprehensive Final report (A)  Final   Result 1 Comment (A)  Final    Comment: Vancomycin-resistant Enterococcus (Enterococcus faecalis) 400 Colonies/mL    ANTIMICROBIAL SUSCEPTIBILITY Comment  Final    Comment:       ** S = Susceptible; I = Intermediate; R = Resistant **                    P = Positive; N = Negative             MICS are expressed in micrograms per mL    Antibiotic                 RSLT#1    RSLT#2    RSLT#3    RSLT#4 Ciprofloxacin                  R Levofloxacin                   R Linezolid                      S Nitrofurantoin                 S Penicillin                     S Tetracycline  R Vancomycin                     R   GC/Chlamydia Probe Amp     Status: None   Collection Time: 10/09/15  8:53 AM  Result Value Ref Range Status   Chlamydia trachomatis, NAA Negative Negative Final   Neisseria gonorrhoeae by PCR Negative Negative Final    RADIOLOGY:  Dg Chest 1 View  Result Date: 05/11/2016 CLINICAL DATA:  Low O2 sats.  Shortness of breath. EXAM: CHEST 1 VIEW COMPARISON:  04/30/2016 FINDINGS: Cardiomegaly. Vascular congestion and interstitial prominence throughout the lungs, likely mild interstitial edema. Low lung volumes with bibasilar atelectasis or infiltrates, right greater than left. Small effusions. Overall aeration slightly worsened since prior study. Right Port-A-Cath in place with the tip at the cavoatrial junction. IMPRESSION: Cardiomegaly with interstitial prominence, likely mild interstitial edema. Low lung volumes with bibasilar atelectasis or infiltrates, right greater the left and small effusions.  Electronically Signed   By: Rolm Baptise M.D.   On: 05/11/2016 10:45    EKG:   Orders placed or performed in visit on 06/21/15  . EKG 12-Lead      Management plans discussed with the patient, family and they are in agreement.  CODE STATUS:     Code Status Orders        Start     Ordered   05/06/16 1624  Full code  Continuous     05/06/16 1623    Code Status History    Date Active Date Inactive Code Status Order ID Comments User Context   04/29/2016  1:24 AM 04/30/2016  9:35 PM Full Code [id number]  Lance Coon, MD Inpatient    Advance Directive Documentation   Flowsheet Row Most Recent Value  Type of Advance Directive  Living will  Pre-existing out of facility DNR order (yellow form or pink MOST form)  No data  "MOST" Form in Place?  No data      TOTAL TIME TAKING CARE OF THIS PATIENT: 43 minutes.   Note: This dictation was prepared with Dragon dictation along with smaller phrase technology. Any transcriptional errors that result from this process are unintentional.   _0 @  on 05/13/2016 at 3:11 PM  Between 7am to 6pm - Pager - [phone]  After 6pm go to [email or web address] - password [email or web address] [place] [facility]  Office  [phone]  CC: Primary care physician; Enid Derry, MD

## 2016-05-13 NOTE — Progress Notes (Signed)
Pt for discharge home with wife. A/o. No resp distress.   Instructions discussed with pt and wife.  presc given.  Meds/ diet  / activity / and f/u discussed. Verbalized understanding. Port deaccessed  W/o difficulty  Per protocol. Sl d/cd. Home via w/c  W/o problems

## 2016-05-13 NOTE — Progress Notes (Signed)
Nutrition Follow-up  DOCUMENTATION CODES:   Severe malnutrition in context of chronic illness  INTERVENTION:  1. Ensure Enlive po BID, each supplement provides 350 kcal and 20 grams of protein 2. Boost Breeze po TID, each supplement provides 250 kcal and 9 grams of protein  NUTRITION DIAGNOSIS:   Malnutrition (severe) related to cancer and cancer related treatments, chronic illness as evidenced by severe depletion of body fat, severe depletion of muscle mass, percent weight loss. -ongoing  GOAL:   Patient will meet greater than or equal to 90% of their needs -progressing  MONITOR:   PO intake, Supplement acceptance, Weight trends  REASON FOR ASSESSMENT:   Malnutrition Screening Tool    ASSESSMENT:    Patient has history of kidney transplant in 2006 and currently with CKD stage III. Patient has one kidney which is the donor kidney. Newly diagnosed B-cell lymphoma with metastasis and tumor lysis  Followed up with pt, wife No complaints from a nutrition perspective except patient is hungry, ordered boost breeze in addition to ensure to help with this. Meal completion: 80-90-100% thus far. Labs and medications reviewed. Likely d/c today per MD  Diet Order:  Diet renal with fluid restriction Fluid restriction: Other (see comments); Room service appropriate? Yes; Fluid consistency: Thin  Skin:  Reviewed, no issues  Last BM:  11/14 per patient report  Height:   Ht Readings from Last 1 Encounters:  05/06/16 5\' 8"  (1.727 m)    Weight:   Wt Readings from Last 1 Encounters:  05/13/16 133 lb 8 oz (60.6 kg)    Ideal Body Weight:  70 kg  BMI:  Body mass index is 20.3 kg/m.  Estimated Nutritional Needs:   Kcal:  1700-2000kcal/day (30-35kcal/kg/day)  Protein:  80-92g/day (1.4-1.6g/kg/day)  Fluid:  2L/day (8ml/kcal/day)  EDUCATION NEEDS:   Education needs addressed  Satira Anis. Isbella Arline, MS, RD LDN Inpatient Clinical Dietitian Pager [phone]

## 2016-05-13 NOTE — Progress Notes (Signed)
[facility] Hematology/Oncology Progress Note  Date of admission: 05/06/2016  Hospital day:  05/13/2016   Chief Complaint: Matthew Brown is a 75 y.o. male with post transplant lymphoproliferative disorder (PTLD), stage IVB diffuse large B cell lymphoma who was admitted with tumor lysis syndrome.  Subjective:  Denies any complaint.  He is eating well.  He denies any nausea.  Ready to go home today or tomorrow.   Social History: The patient is alone today.  Allergies: No Known Allergies  Scheduled Medications: . allopurinol  200 mg Oral Daily  . docusate sodium  100 mg Oral BID  . enoxaparin (LOVENOX) injection  40 mg Subcutaneous Q24H  . feeding supplement  1 Container Oral TID BM  . feeding supplement (ENSURE ENLIVE)  237 mL Oral TID BM  . finasteride  5 mg Oral Daily  . gentamicin irrigation   Irrigation Once  . metoprolol tartrate  25 mg Oral BID  . multivitamin with minerals  1 tablet Oral Daily  . oxybutynin  5 mg Oral Daily  . pantoprazole  40 mg Oral Daily  . predniSONE  70 mg Oral Q breakfast  . sodium chloride flush  3 mL Intravenous Q12H  . sucralfate  1 g Oral QID  . tacrolimus  3 mg Oral BID  . tamsulosin  0.4 mg Oral Daily    Review of Systems: GENERAL:  Feels good. No fevers or sweats.  Weight loss. PERFORMANCE STATUS (ECOG):  1 Lungs: No shortness of breath or cough. No hemoptysis. Cardiac: No chest pain, palpitations, orthopnea, or PND. GI: No nausea, vomiting, diarrhea, constipation, melena or hematochezia. GU: Enlarged prostate. No urgency, frequency, dysuria, or hematuria. Musculoskeletal: Back pain and left hip pain. No joint pain. No muscle tenderness. Extremities: No pain or swelling. Skin: No rashes or skin changes. Neuro: No numbness or weakness, balance or coordination issues. Endocrine: No diabetes, thyroid issues, hot flashes or night sweats. Psych: No mood changes, depression or anxiety. Pain: No pain. Review  of systems: All other systems reviewed and found to be negative.  Physical Exam: Blood pressure (!) 127/46, pulse (!) 58, temperature 97.4 F (36.3 C), temperature source Oral, resp. rate 18, height 5' 8"  (1.727 m), weight 133 lb 8 oz (60.6 kg), SpO2 97 %.  GENERAL:Thin elderly gentleman lying comfortably on the medical unit in no acute distress. MENTAL STATUS: Alert and oriented to person, place and time. HEAD:Gray goatee. Normocephalic, atraumatic, face symmetric, no Cushingoid features. EYES:Glasses. Brown eyes. Pupils equal round and reactive to light and accomodation. No conjunctivitis or scleral icterus. CBS:WHQPRFFMBW clear without lesion. Edentulous. Tonguenormal. Mucous membranes dry. RESPIRATORY:Clear to auscultationwithout rales, wheezes or rhonchi. CARDIOVASCULAR:Regular rate andrhythmwithout murmur, rub or gallop. CHEST:  Right sided port-a-cath accessed. ABDOMEN:Soft, non-tender, with active bowel sounds, and no hepatosplenomegaly. No masses.  SKIN: No rashes, ulcers or lesions. EXTREMITIES: No edema, no skin discoloration or tenderness. No palpable cords. NEUROLOGICAL: Unremarkable. PSYCH: Appropriate.   Results for orders placed or performed during the hospital encounter of 05/06/16 (from the past 48 hour(s))  Basic metabolic panel     Status: Abnormal   Collection Time: 05/12/16 12:20 PM  Result Value Ref Range   Sodium 136 135 - 145 mmol/L   Potassium 5.1 3.5 - 5.1 mmol/L   Chloride 108 101 - 111 mmol/L   CO2 25 22 - 32 mmol/L   Glucose, Bld 168 (H) 65 - 99 mg/dL   BUN 47 (H) 6 - 20 mg/dL   Creatinine, Ser 1.19  0.61 - 1.24 mg/dL   Calcium 11.5 (H) 8.9 - 10.3 mg/dL   GFR calc non Af Amer 58 (L) >60 mL/min   GFR calc Af Amer >60 >60 mL/min    Comment: (NOTE) The eGFR has been calculated using the CKD EPI equation. This calculation has not been validated in all clinical situations. eGFR's persistently <60 mL/min signify possible  Chronic Kidney Disease.    Anion gap 3 (L) 5 - 15  Uric acid     Status: Abnormal   Collection Time: 05/12/16 12:20 PM  Result Value Ref Range   Uric Acid, Serum 8.2 (H) 4.4 - 7.6 mg/dL  Basic metabolic panel     Status: Abnormal   Collection Time: 05/13/16  6:07 AM  Result Value Ref Range   Sodium 137 135 - 145 mmol/L   Potassium 5.9 (H) 3.5 - 5.1 mmol/L   Chloride 110 101 - 111 mmol/L   CO2 24 22 - 32 mmol/L   Glucose, Bld 156 (H) 65 - 99 mg/dL   BUN 54 (H) 6 - 20 mg/dL   Creatinine, Ser 1.21 0.61 - 1.24 mg/dL   Calcium 11.2 (H) 8.9 - 10.3 mg/dL   GFR calc non Af Amer 56 (L) >60 mL/min   GFR calc Af Amer >60 >60 mL/min    Comment: (NOTE) The eGFR has been calculated using the CKD EPI equation. This calculation has not been validated in all clinical situations. eGFR's persistently <60 mL/min signify possible Chronic Kidney Disease.    Anion gap 3 (L) 5 - 15  Uric acid     Status: Abnormal   Collection Time: 05/13/16  6:07 AM  Result Value Ref Range   Uric Acid, Serum 8.1 (H) 4.4 - 7.6 mg/dL  CBC with Differential     Status: Abnormal   Collection Time: 05/13/16  6:07 AM  Result Value Ref Range   WBC 4.3 3.8 - 10.6 K/uL   RBC 3.30 (L) 4.40 - 5.90 MIL/uL   Hemoglobin 8.0 (L) 13.0 - 18.0 g/dL   HCT 24.7 (L) 40.0 - 52.0 %   MCV 75.0 (L) 80.0 - 100.0 fL   MCH 24.4 (L) 26.0 - 34.0 pg   MCHC 32.5 32.0 - 36.0 g/dL   RDW 19.8 (H) 11.5 - 14.5 %   Platelets 185 150 - 440 K/uL   Neutrophils Relative % 93 %   Neutro Abs 4.0 1.4 - 6.5 K/uL   Lymphocytes Relative 4 %   Lymphs Abs 0.2 (L) 1.0 - 3.6 K/uL   Monocytes Relative 3 %   Monocytes Absolute 0.1 (L) 0.2 - 1.0 K/uL   Eosinophils Relative 0 %   Eosinophils Absolute 0.0 0 - 0.7 K/uL   Basophils Relative 0 %   Basophils Absolute 0.0 0 - 0.1 K/uL  Potassium     Status: None   Collection Time: 05/13/16 12:58 PM  Result Value Ref Range   Potassium 4.9 3.5 - 5.1 mmol/L   No results found.  Assessment:  Matthew Brown is a  75 y.o. male with stage IVBE diffuse large B cell lymphoma. He has a post-transplant lymphoproliferative disorder (PTLD).  He is day 5 s/p cycle #1 mini-RCHOP.  He is s/p renal transplant.  He has tumor lysis syndrome improved with hydration and allopurinol.  Plan:   1. Oncology:  Patient had port-a-cath placed (05/07/2016) and bone marrow aspirate and biopsy (05/08/2016).  Echo on 05/06/2016 revealed an EF of 55-60%.  Hepatitis testing negative.  Patient presented  at tumor board on 05/08/2016.  G6PD assay normal (patient may receive rasburicase if needed).   Patient is day 5 s/p cycle #1 mini-RCHOP.  Patient is day 4 of 5 prednisone 70 mg a day.  Patient's baseline prednisone dose of 5 mg a day will be held until his 5 day burst of steroids complete.  Hematocrit drifting down, may need transfusion with upcoming holidays.   Tumor lysis syndrome well managed.  Continue daily allopurinol.   2.  Hematology:  Anemia work-up on 04/15/2016 revealed an iron saturation of 6% and TIBC 241 c/w chronic disease.  Ferritin was 343.  B12 and folate were normal.  Reticulocyte count was 2% (low for level of anemia).  Await bone marrow to assess for lymphoma.  Check CBC in AM.  If patient requires blood products, they need to be leukopoor and irradiated.  3.  Nephrology:  Patient is s/p renal transplant on Prograf and prednisone.  MMF was discontinued.  Hydration held then restarted.  Prograf level 5.5 (3.0-8.0) on 05/08/2016.  Uric acid 8.1.  Increase allopurinol to 200 mg a day.  Creatinine improved to 1.21.  Calcium 11.2.  Zometa given today.  Hyperkalemia s/p kayexalate.  Appreciate nephrology conslt.  4.  Code status:  Discussed with patient and his wife.  Full Code.  5.  Disposition:  Possible discharge today or tomorrow.  Anticipate follow-up in the oncology clinic on 05/14/2016 if discharged and on 05/19/2016 (Monday after Thanksgiving).   Lequita Asal, MD  05/13/2016

## 2016-05-13 NOTE — Progress Notes (Signed)
Pharmacy Note - Medication Related  75 y.o. male with post transplant lymphoproliferative disorder (PTLD), stage IVB diffuse large B cell lymphoma who was admitted with tumor lysis syndrome  Calcium: 11.2, Corrected Ca: 12.3  Dr. Margaretmary Eddy ordering Zometa for hypercalcemia of malignancy. Discussed with Dr. Candiss Norse in light of TLS and complicated renal history. Dr. Candiss Norse okay with one time dose, will proceed.  Rexene Edison, PharmD Clinical Pharmacist  05/13/2016 12:40 PM

## 2016-05-13 NOTE — Discharge Instructions (Signed)
°  Non-Hodgkin Lymphoma, Adult Non-Hodgkin lymphoma (NHL) is a cancer of the lymphatic system. The lymphatic system is part of your bodys defense (immune) system, which protects the body from infections, germs, and diseases. NHL affects a type of white blood cell called lymphocytes. There are different types of NHL. The kind of NHL you have depends on the type of cells it affects and how quickly it grows and spreads. What are the causes? The cause of NHL is not known. What increases the risk? Risks factors for NHL include:  Having a weak immune system, especially after an organ transplant.  Specific bacterial and viral infections including:  HIV.  Epstein-Barr virus.  Age. NHL is more common in people over the age of 36. What are the signs or symptoms? Symptoms of NHL may include:  Swelling of the lymph nodes in your neck, armpits, or groin.  Fever.  Chills.  Sweating without cause.  Itchy skin.  Fatigue.  Unexplained weight loss.  Coughing, breathing trouble, and chest pain.  Unexplained weakness.  Swelling of your face, legs, or stomach (abdomen).  Abdominal pain.  Nausea and vomiting.  Loss of appetite.  Headache.  Difficulty concentrating.  Changes in personality.  Seizures. How is this diagnosed? The diagnosis of NHL may include:  A physical exam and medical history.  Blood tests.  Chest X-ray.  Testing of body tissue (biopsy) of your lymph gland or bone marrow.  Different types of scans, including:  CT scans.  Positron emission tomography (PET) scan.  Gallium scan. How is this treated? NHL can be treated in different ways. This depends on your symptoms, the stage of NHL when you were first diagnosed, and the speed with which it is spreading. Treatment may include:  Radiation therapy. This uses radiation to destroy cancer cells.  Chemotherapy. This uses medicine to destroy the cancer cells.  Biological therapy. This uses the body's  immune system to destroy cancer cells.  Bone marrow transplantation.  Blood or platelet transfusions. This may be needed if your blood counts are low. Your cancer will be staged to determine its severity and extent. Your health care provider does staging to find out whether the cancer has spread, and if so, to what parts of your body. You may need to have more tests to determine the stage of your cancer. Follow these instructions at home:  Take medicines only as directed by your health care provider.  Plan rest periods when fatigued.  Keep all follow-up visits as directed by your health care provider. This is important. Contact a health care provider if:  You have new symptoms of NHL.  You have signs of infection. These might include:  Fever.  Chills.  Sore throat.  Headache.  Nausea.  Vomiting.  Diarrhea. This information is not intended to replace advice given to you by your health care provider. Make sure you discuss any questions you have with your health care provider. Document Released: 12/21/2006 Document Revised: 11/15/2015 Document Reviewed: 04/19/2015 Elsevier Interactive Patient Education  2017 [place] with primary care physician in a week Follow-up with nephrology Dr. Candiss Brown in a week Follow-up with Dr. Mike Brown on 05/14/2016 and 05/19/2016

## 2016-05-13 NOTE — Progress Notes (Signed)
[facility]  ROUNDING NOTE   Subjective:   Wife at bedside. Ms Dallie Dad Feels well.  No shortness of breath.  No leg edema. Lab results show today creatinine has improved to 1.21, BUN is high at 54,  potassium 5.9. Calcium remains high at 11.2  Objective:  Vital signs in last 24 hours:  Temp:  [97.4 F (36.3 C)-97.9 F (36.6 C)] 97.4 F (36.3 C) (11/21 1302) Pulse Rate:  [44-61] 58 (11/21 1302) Resp:  [17-20] 18 (11/21 1302) BP: (122-131)/(46-59) 127/46 (11/21 1302) SpO2:  [94 %-98 %] 97 % (11/21 1302) Weight:  [60.6 kg (133 lb 8 oz)] 60.6 kg (133 lb 8 oz) (11/21 0500)  Weight change: 0.499 kg (1 lb 1.6 oz) Filed Weights   05/11/16 1106 05/12/16 0536 05/13/16 0500  Weight: 60.1 kg (132 lb 6.4 oz) 59.7 kg (131 lb 9 oz) 60.6 kg (133 lb 8 oz)    Intake/Output: I/O last 3 completed shifts: In: 807.2 [P.O.:360; I.V.:447.2] Out: 202 [Urine:201; Stool:1]   Intake/Output this shift:  Total I/O In: 240 [P.O.:240] Out: -   Physical Exam: General: NAD, cachectic, laying in bed  Head: Normocephalic, atraumatic. Moist oral mucosal membranes  Eyes: Anicteric,  Neck: Supple, trachea midline  Lungs:  Clear b/l  Heart: Regular rate and rhythm  Abdomen:  Soft, nontender, right lower quadrant nontender allograft kidney  Extremities:  no peripheral edema.  Neurologic: Nonfocal, moving all four extremities  Skin: No lesions  Access: Right forearm AVF +bruit and +thrill    Basic Metabolic Panel:  Recent Labs Lab 05/08/16 0515 05/09/16 0520 05/10/16 0555 05/11/16 0548 05/12/16 1220 05/13/16 0607 05/13/16 1258  NA 134* 138 136 136 136 137  --   K 4.7 4.8 5.2* 5.4* 5.1 5.9* 4.9  CL 104 111 112* 110 108 110  --   CO2 25 24 21* 24 25 24   --   GLUCOSE 100* 95 84 120* 168* 156*  --   BUN 43* 32* 29* 39* 47* 54*  --   CREATININE 1.47* 1.42* 1.26* 1.38* 1.19 1.21  --   CALCIUM 10.3 10.6* 10.7* 11.2* 11.5* 11.2*  --   PHOS 2.5 2.0*  --   --   --   --   --      Liver Function Tests:  Recent Labs Lab 05/08/16 0515 05/09/16 0520  AST  --  27  ALT  --  11*  ALKPHOS  --  120  BILITOT  --  0.6  PROT  --  5.3*  ALBUMIN 2.6* 2.6*   No results for input(s): LIPASE, AMYLASE in the last 168 hours. No results for input(s): AMMONIA in the last 168 hours.  CBC:  Recent Labs Lab 05/07/16 0517 05/08/16 0020 05/09/16 0520 05/13/16 0607  WBC 5.0 4.3 5.1 4.3  NEUTROABS  --  3.6 4.2 4.0  HGB 8.4* 8.7* 8.3* 8.0*  HCT 25.2* 26.5* 25.7* 24.7*  MCV 74.3* 73.9* 74.5* 75.0*  PLT 213 224 208 185    Cardiac Enzymes: No results for input(s): CKTOTAL, CKMB, CKMBINDEX, TROPONINI in the last 168 hours.  BNP: Invalid input(s): POCBNP  CBG: No results for input(s): GLUCAP in the last 168 hours.  Microbiology: Results for orders placed or performed in visit on 10/09/15  Microscopic Examination     Status: Abnormal   Collection Time: 10/09/15  8:49 AM  Result Value Ref Range Status   WBC, UA 6-10 (A) 0 - 5 /hpf Final   RBC, UA 0-2 0 -  2 /hpf Final   Epithelial Cells (non renal) 0-10 0 - 10 /hpf Final   Bacteria, UA None seen None seen/Few Final  CULTURE, URINE COMPREHENSIVE     Status: Abnormal   Collection Time: 10/09/15  8:49 AM  Result Value Ref Range Status   Urine Culture, Comprehensive Final report (A)  Final   Result 1 Comment (A)  Final    Comment: Vancomycin-resistant Enterococcus (Enterococcus faecalis) 400 Colonies/mL    ANTIMICROBIAL SUSCEPTIBILITY Comment  Final    Comment:       ** S = Susceptible; I = Intermediate; R = Resistant **                    P = Positive; N = Negative             MICS are expressed in micrograms per mL    Antibiotic                 RSLT#1    RSLT#2    RSLT#3    RSLT#4 Ciprofloxacin                  R Levofloxacin                   R Linezolid                      S Nitrofurantoin                 S Penicillin                     S Tetracycline                   R Vancomycin                      R   GC/Chlamydia Probe Amp     Status: None   Collection Time: 10/09/15  8:53 AM  Result Value Ref Range Status   Chlamydia trachomatis, NAA Negative Negative Final   Neisseria gonorrhoeae by PCR Negative Negative Final    Coagulation Studies: No results for input(s): LABPROT, INR in the last 72 hours.  Urinalysis: No results for input(s): COLORURINE, LABSPEC, PHURINE, GLUCOSEU, HGBUR, BILIRUBINUR, KETONESUR, PROTEINUR, UROBILINOGEN, NITRITE, LEUKOCYTESUR in the last 72 hours.  Invalid input(s): APPERANCEUR    Imaging: No results found.   Medications:    . allopurinol  200 mg Oral Daily  . docusate sodium  100 mg Oral BID  . enoxaparin (LOVENOX) injection  40 mg Subcutaneous Q24H  . feeding supplement  1 Container Oral TID BM  . feeding supplement (ENSURE ENLIVE)  237 mL Oral TID BM  . finasteride  5 mg Oral Daily  . gentamicin irrigation   Irrigation Once  . metoprolol tartrate  25 mg Oral BID  . multivitamin with minerals  1 tablet Oral Daily  . oxybutynin  5 mg Oral Daily  . pantoprazole  40 mg Oral Daily  . predniSONE  70 mg Oral Q breakfast  . sodium chloride flush  3 mL Intravenous Q12H  . sucralfate  1 g Oral QID  . tacrolimus  3 mg Oral BID  . tamsulosin  0.4 mg Oral Daily  . zoledronic acid (ZOMETA) IV  4 mg Intravenous Once   acetaminophen **OR** acetaminophen, albuterol, diphenhydrAMINE, HYDROcodone-acetaminophen, lactulose, ondansetron **OR** ondansetron (ZOFRAN) IV, polyethylene glycol, polyvinyl alcohol, polyvinyl alcohol  Assessment/ Plan:  Mr. CUTLER PATRIARCA is a 75 y.o. black male with renal transplant 2006 with baseline creatinine of 1.6-1.9 followed by [facility], Dr. Suzan Nailer on prograf, myfortic and prednisone. Patient with past medical history of pulmonary hyeprtension, atrial fibrillation, hypertension, anemia, hyperlipidemia, gout, GERD, BPH, history of GI bleed , who was admitted to [facility] on 05/06/2016 for diffuse large B cell lymphoma with  hyponatremia, hypercalcemia, hyperuricemia And chronic kidney disease on renal transplant.   1. Acute renal failure on chronic kidney disease stage IIIT: renal transplant in 2006 with baseline creatinine of 1.6-1.9. Holding myfortic. Continues on tacrolimus and prednisone.  - tacrolimus trough level 5.5 November 16 - Monitor renal function, urine output and volume status.  - d/c fluid restriction   2. Hyperkalemia - change to low K diet.  - SPS x 1  3. Hypercalemia with hyperparathyroidism: consistent with primary but with renal insufficiency. Secondary to malignancy.   - zoledronic acid today - PTH 106 on NOV 8  4. Tumor lysis syndrome: uric acid 8.1 today. G6PD negative.  -  Allopurinol -  Appreciate hematology input.      LOS: 7 [place] Bays 11/21/20171:40 PM

## 2016-05-14 ENCOUNTER — Inpatient Hospital Stay: Payer: Commercial Managed Care - HMO

## 2016-05-14 DIAGNOSIS — M109 Gout, unspecified: Secondary | ICD-10-CM | POA: Diagnosis not present

## 2016-05-14 DIAGNOSIS — I129 Hypertensive chronic kidney disease with stage 1 through stage 4 chronic kidney disease, or unspecified chronic kidney disease: Secondary | ICD-10-CM | POA: Diagnosis not present

## 2016-05-14 DIAGNOSIS — I272 Pulmonary hypertension, unspecified: Secondary | ICD-10-CM | POA: Diagnosis not present

## 2016-05-14 DIAGNOSIS — N400 Enlarged prostate without lower urinary tract symptoms: Secondary | ICD-10-CM | POA: Diagnosis not present

## 2016-05-14 DIAGNOSIS — N186 End stage renal disease: Secondary | ICD-10-CM | POA: Diagnosis not present

## 2016-05-14 DIAGNOSIS — R634 Abnormal weight loss: Secondary | ICD-10-CM | POA: Diagnosis not present

## 2016-05-14 DIAGNOSIS — N289 Disorder of kidney and ureter, unspecified: Secondary | ICD-10-CM | POA: Diagnosis not present

## 2016-05-14 DIAGNOSIS — M1990 Unspecified osteoarthritis, unspecified site: Secondary | ICD-10-CM | POA: Diagnosis not present

## 2016-05-14 DIAGNOSIS — G8929 Other chronic pain: Secondary | ICD-10-CM | POA: Diagnosis not present

## 2016-05-14 DIAGNOSIS — I4891 Unspecified atrial fibrillation: Secondary | ICD-10-CM | POA: Diagnosis not present

## 2016-05-14 DIAGNOSIS — R1031 Right lower quadrant pain: Secondary | ICD-10-CM | POA: Diagnosis not present

## 2016-05-14 DIAGNOSIS — M549 Dorsalgia, unspecified: Secondary | ICD-10-CM | POA: Diagnosis not present

## 2016-05-14 DIAGNOSIS — R918 Other nonspecific abnormal finding of lung field: Secondary | ICD-10-CM | POA: Diagnosis not present

## 2016-05-14 DIAGNOSIS — Z8719 Personal history of other diseases of the digestive system: Secondary | ICD-10-CM | POA: Diagnosis not present

## 2016-05-14 DIAGNOSIS — E785 Hyperlipidemia, unspecified: Secondary | ICD-10-CM | POA: Diagnosis not present

## 2016-05-14 DIAGNOSIS — J90 Pleural effusion, not elsewhere classified: Secondary | ICD-10-CM | POA: Diagnosis not present

## 2016-05-14 DIAGNOSIS — C8330 Diffuse large B-cell lymphoma, unspecified site: Secondary | ICD-10-CM | POA: Diagnosis not present

## 2016-05-14 DIAGNOSIS — R51 Headache: Secondary | ICD-10-CM | POA: Diagnosis not present

## 2016-05-14 DIAGNOSIS — E210 Primary hyperparathyroidism: Secondary | ICD-10-CM | POA: Diagnosis not present

## 2016-05-14 DIAGNOSIS — M899 Disorder of bone, unspecified: Secondary | ICD-10-CM | POA: Diagnosis not present

## 2016-05-14 DIAGNOSIS — N529 Male erectile dysfunction, unspecified: Secondary | ICD-10-CM | POA: Diagnosis not present

## 2016-05-14 DIAGNOSIS — K219 Gastro-esophageal reflux disease without esophagitis: Secondary | ICD-10-CM | POA: Diagnosis not present

## 2016-05-14 DIAGNOSIS — R590 Localized enlarged lymph nodes: Secondary | ICD-10-CM | POA: Diagnosis not present

## 2016-05-14 DIAGNOSIS — K769 Liver disease, unspecified: Secondary | ICD-10-CM | POA: Diagnosis not present

## 2016-05-14 LAB — BASIC METABOLIC PANEL
Anion gap: 5 (ref 5–15)
BUN: 51 mg/dL — ABNORMAL HIGH (ref 6–20)
CO2: 28 mmol/L (ref 22–32)
Calcium: 11.6 mg/dL — ABNORMAL HIGH (ref 8.9–10.3)
Chloride: 106 mmol/L (ref 101–111)
Creatinine, Ser: 1.06 mg/dL (ref 0.61–1.24)
GFR calc Af Amer: >60 mL/min (ref >60)
GFR calc non Af Amer: >60 mL/min (ref >60)
Glucose, Bld: 127 mg/dL — ABNORMAL HIGH (ref 65–99)
Potassium: 5 mmol/L (ref 3.5–5.1)
Sodium: 139 mmol/L (ref 135–145)

## 2016-05-14 LAB — CALCIUM, IONIZED: Calcium, Ionized, Serum: 7.3 mg/dL — ABNORMAL HIGH (ref 4.5–5.6)

## 2016-05-18 ENCOUNTER — Inpatient Hospital Stay: Payer: Commercial Managed Care - HMO

## 2016-05-18 ENCOUNTER — Encounter: Payer: Self-pay | Admitting: Emergency Medicine

## 2016-05-18 ENCOUNTER — Emergency Department: Payer: Commercial Managed Care - HMO

## 2016-05-18 ENCOUNTER — Inpatient Hospital Stay
Admission: EM | Admit: 2016-05-18 | Discharge: 2016-05-22 | DRG: 808 | Disposition: A | Discharge status: Home / Self Care | Payer: Commercial Managed Care - HMO | Attending: Internal Medicine | Admitting: Internal Medicine

## 2016-05-18 ENCOUNTER — Other Ambulatory Visit: Payer: Self-pay

## 2016-05-18 DIAGNOSIS — D47Z1 Post-transplant lymphoproliferative disorder (PTLD): Secondary | ICD-10-CM | POA: Diagnosis present

## 2016-05-18 DIAGNOSIS — A419 Sepsis, unspecified organism: Secondary | ICD-10-CM

## 2016-05-18 DIAGNOSIS — H9190 Unspecified hearing loss, unspecified ear: Secondary | ICD-10-CM | POA: Diagnosis present

## 2016-05-18 DIAGNOSIS — R0600 Dyspnea, unspecified: Secondary | ICD-10-CM

## 2016-05-18 DIAGNOSIS — I248 Other forms of acute ischemic heart disease: Secondary | ICD-10-CM | POA: Diagnosis present

## 2016-05-18 DIAGNOSIS — D6959 Other secondary thrombocytopenia: Secondary | ICD-10-CM | POA: Diagnosis present

## 2016-05-18 DIAGNOSIS — R197 Diarrhea, unspecified: Secondary | ICD-10-CM | POA: Diagnosis present

## 2016-05-18 DIAGNOSIS — C8330 Diffuse large B-cell lymphoma, unspecified site: Secondary | ICD-10-CM | POA: Diagnosis present

## 2016-05-18 DIAGNOSIS — T380X5A Adverse effect of glucocorticoids and synthetic analogues, initial encounter: Secondary | ICD-10-CM | POA: Diagnosis present

## 2016-05-18 DIAGNOSIS — I129 Hypertensive chronic kidney disease with stage 1 through stage 4 chronic kidney disease, or unspecified chronic kidney disease: Secondary | ICD-10-CM | POA: Diagnosis present

## 2016-05-18 DIAGNOSIS — I959 Hypotension, unspecified: Secondary | ICD-10-CM | POA: Diagnosis present

## 2016-05-18 DIAGNOSIS — E785 Hyperlipidemia, unspecified: Secondary | ICD-10-CM | POA: Diagnosis present

## 2016-05-18 DIAGNOSIS — D849 Immunodeficiency, unspecified: Secondary | ICD-10-CM

## 2016-05-18 DIAGNOSIS — Z79899 Other long term (current) drug therapy: Secondary | ICD-10-CM

## 2016-05-18 DIAGNOSIS — R509 Fever, unspecified: Secondary | ICD-10-CM

## 2016-05-18 DIAGNOSIS — Z87891 Personal history of nicotine dependence: Secondary | ICD-10-CM

## 2016-05-18 DIAGNOSIS — Y830 Surgical operation with transplant of whole organ as the cause of abnormal reaction of the patient, or of later complication, without mention of misadventure at the time of the procedure: Secondary | ICD-10-CM | POA: Diagnosis present

## 2016-05-18 DIAGNOSIS — D899 Disorder involving the immune mechanism, unspecified: Secondary | ICD-10-CM

## 2016-05-18 DIAGNOSIS — N400 Enlarged prostate without lower urinary tract symptoms: Secondary | ICD-10-CM | POA: Diagnosis present

## 2016-05-18 DIAGNOSIS — D638 Anemia in other chronic diseases classified elsewhere: Secondary | ICD-10-CM | POA: Diagnosis present

## 2016-05-18 DIAGNOSIS — E860 Dehydration: Secondary | ICD-10-CM | POA: Diagnosis present

## 2016-05-18 DIAGNOSIS — K219 Gastro-esophageal reflux disease without esophagitis: Secondary | ICD-10-CM | POA: Diagnosis present

## 2016-05-18 DIAGNOSIS — M109 Gout, unspecified: Secondary | ICD-10-CM | POA: Diagnosis present

## 2016-05-18 DIAGNOSIS — Z79891 Long term (current) use of opiate analgesic: Secondary | ICD-10-CM

## 2016-05-18 DIAGNOSIS — I481 Persistent atrial fibrillation: Secondary | ICD-10-CM | POA: Diagnosis present

## 2016-05-18 DIAGNOSIS — D709 Neutropenia, unspecified: Principal | ICD-10-CM | POA: Diagnosis present

## 2016-05-18 DIAGNOSIS — M6281 Muscle weakness (generalized): Secondary | ICD-10-CM

## 2016-05-18 DIAGNOSIS — R5081 Fever presenting with conditions classified elsewhere: Secondary | ICD-10-CM | POA: Diagnosis present

## 2016-05-18 DIAGNOSIS — M1990 Unspecified osteoarthritis, unspecified site: Secondary | ICD-10-CM | POA: Diagnosis present

## 2016-05-18 DIAGNOSIS — R112 Nausea with vomiting, unspecified: Secondary | ICD-10-CM

## 2016-05-18 DIAGNOSIS — Z7952 Long term (current) use of systemic steroids: Secondary | ICD-10-CM

## 2016-05-18 DIAGNOSIS — T451X5A Adverse effect of antineoplastic and immunosuppressive drugs, initial encounter: Secondary | ICD-10-CM | POA: Diagnosis present

## 2016-05-18 DIAGNOSIS — Z940 Kidney transplant status: Secondary | ICD-10-CM

## 2016-05-18 DIAGNOSIS — T8619 Other complication of kidney transplant: Secondary | ICD-10-CM | POA: Diagnosis present

## 2016-05-18 DIAGNOSIS — I272 Pulmonary hypertension, unspecified: Secondary | ICD-10-CM | POA: Diagnosis present

## 2016-05-18 DIAGNOSIS — Z8249 Family history of ischemic heart disease and other diseases of the circulatory system: Secondary | ICD-10-CM

## 2016-05-18 HISTORY — DX: Non-Hodgkin lymphoma, unspecified, unspecified site: C85.90

## 2016-05-18 LAB — CBC WITH DIFFERENTIAL/PLATELET
BASOS ABS: 0 10*3/uL (ref 0–0.1)
Basophils Relative: 1 %
EOS PCT: 2 %
Eosinophils Absolute: 0 10*3/uL (ref 0–0.7)
HCT: 29.3 % — ABNORMAL LOW (ref 40.0–52.0)
Hemoglobin: 9.5 g/dL — ABNORMAL LOW (ref 13.0–18.0)
LYMPHS ABS: 0.2 10*3/uL — AB (ref 1.0–3.6)
LYMPHS PCT: 8 %
MCH: 24.1 pg — AB (ref 26.0–34.0)
MCHC: 32.3 g/dL (ref 32.0–36.0)
MCV: 74.7 fL — AB (ref 80.0–100.0)
MONO ABS: 0.2 10*3/uL (ref 0.2–1.0)
Monocytes Relative: 8 %
Neutro Abs: 1.7 10*3/uL (ref 1.4–6.5)
Neutrophils Relative %: 81 %
PLATELETS: 171 10*3/uL (ref 150–440)
RBC: 3.93 MIL/uL — ABNORMAL LOW (ref 4.40–5.90)
RDW: 19.3 % — AB (ref 11.5–14.5)
WBC: 2.1 10*3/uL — ABNORMAL LOW (ref 3.8–10.6)

## 2016-05-18 LAB — URINALYSIS COMPLETE WITH MICROSCOPIC (ARMC ONLY)
BILIRUBIN URINE: NEGATIVE
Bacteria, UA: NONE SEEN
Glucose, UA: NEGATIVE mg/dL
Hgb urine dipstick: NEGATIVE
Leukocytes, UA: NEGATIVE
Nitrite: NEGATIVE
Protein, ur: 30 mg/dL — AB
SQUAMOUS EPITHELIAL / LPF: NONE SEEN
Specific Gravity, Urine: 1.017 (ref 1.005–1.030)
pH: 8 (ref 5.0–8.0)

## 2016-05-18 LAB — COMPREHENSIVE METABOLIC PANEL
ALT: 15 U/L — AB (ref 17–63)
AST: 22 U/L (ref 15–41)
Albumin: 3.5 g/dL (ref 3.5–5.0)
Alkaline Phosphatase: 125 U/L (ref 38–126)
Anion gap: 9 (ref 5–15)
BUN: 27 mg/dL — AB (ref 6–20)
CHLORIDE: 102 mmol/L (ref 101–111)
CO2: 32 mmol/L (ref 22–32)
CREATININE: 1.2 mg/dL (ref 0.61–1.24)
Calcium: 10 mg/dL (ref 8.9–10.3)
GFR calc Af Amer: >60 mL/min (ref >60)
GFR, EST NON AFRICAN AMERICAN: 57 mL/min — AB (ref >60)
Glucose, Bld: 142 mg/dL — ABNORMAL HIGH (ref 65–99)
Potassium: 3.8 mmol/L (ref 3.5–5.1)
Sodium: 143 mmol/L (ref 135–145)
Total Bilirubin: 0.9 mg/dL (ref 0.3–1.2)
Total Protein: 6.6 g/dL (ref 6.5–8.1)

## 2016-05-18 LAB — LACTIC ACID, PLASMA
LACTIC ACID, VENOUS: 0.3 mmol/L — AB (ref 0.5–1.9)
Lactic Acid, Venous: 1.2 mmol/L (ref 0.5–1.9)

## 2016-05-18 LAB — TROPONIN I
TROPONIN I: 0.06 ng/mL — AB (ref <0.03)
TROPONIN I: 0.05 ng/mL — AB (ref <0.03)

## 2016-05-18 LAB — INFLUENZA PANEL BY PCR (TYPE A & B)
Influenza A By PCR: NEGATIVE
Influenza B By PCR: NEGATIVE

## 2016-05-18 LAB — PROCALCITONIN

## 2016-05-18 LAB — APTT: APTT: 32 s (ref 24–36)

## 2016-05-18 LAB — PROTIME-INR
INR: 1.2
Prothrombin Time: 15.3 seconds — ABNORMAL HIGH (ref 11.4–15.2)

## 2016-05-18 MED ORDER — ALBUTEROL SULFATE (2.5 MG/3ML) 0.083% IN NEBU: 2.5000 mg | INHALATION_SOLUTION | RESPIRATORY_TRACT | Status: DC | PRN

## 2016-05-18 MED ORDER — FINASTERIDE 5 MG PO TABS
5.0000 mg | ORAL_TABLET | Freq: Every day | ORAL | Status: DC
  Administered 2016-05-18 – 2016-05-22 (×5): 5 mg via ORAL
  Filled 2016-05-18 (×5): qty 1

## 2016-05-18 MED ORDER — DEXTROSE 5 % IV SOLN
2.0000 g | Freq: Two times a day (BID) | INTRAVENOUS | Status: DC
  Administered 2016-05-18: 22:00:00 2 g via INTRAVENOUS
  Filled 2016-05-18 (×3): qty 2

## 2016-05-18 MED ORDER — OXYBUTYNIN CHLORIDE ER 5 MG PO TB24
5.0000 mg | ORAL_TABLET | Freq: Every day | ORAL | Status: DC
  Administered 2016-05-18 – 2016-05-22 (×5): 5 mg via ORAL
  Filled 2016-05-18 (×6): qty 1

## 2016-05-18 MED ORDER — LACTULOSE 10 GM/15ML PO SOLN: 5.0000 g | Freq: Every day | ORAL | Status: DC | PRN

## 2016-05-18 MED ORDER — HYDROCODONE-ACETAMINOPHEN 5-325 MG PO TABS
1.0000 | ORAL_TABLET | ORAL | Status: DC | PRN
  Administered 2016-05-18 – 2016-05-19 (×3): 1 via ORAL
  Filled 2016-05-18 (×3): qty 1

## 2016-05-18 MED ORDER — SODIUM CHLORIDE 0.9 % IV BOLUS (SEPSIS)
1000.0000 mL | Freq: Once | INTRAVENOUS | Status: AC
  Administered 2016-05-18: 1000 mL via INTRAVENOUS

## 2016-05-18 MED ORDER — ASPIRIN 325 MG PO TABS
325.0000 mg | ORAL_TABLET | Freq: Every day | ORAL | Status: DC
  Administered 2016-05-18 – 2016-05-22 (×5): 325 mg via ORAL
  Filled 2016-05-18 (×5): qty 1

## 2016-05-18 MED ORDER — CEFEPIME-DEXTROSE 2 GM/50ML IV SOLR
2.0000 g | Freq: Once | INTRAVENOUS | Status: AC
  Administered 2016-05-18: 2 g via INTRAVENOUS
  Filled 2016-05-18: qty 50

## 2016-05-18 MED ORDER — VANCOMYCIN HCL IN DEXTROSE 750-5 MG/150ML-% IV SOLN
750.0000 mg | INTRAVENOUS | Status: DC
  Administered 2016-05-18 – 2016-05-19 (×2): 750 mg via INTRAVENOUS
  Filled 2016-05-18 (×3): qty 150

## 2016-05-18 MED ORDER — METOPROLOL TARTRATE 25 MG PO TABS
25.0000 mg | ORAL_TABLET | Freq: Two times a day (BID) | ORAL | Status: DC
  Administered 2016-05-18 – 2016-05-19 (×2): 25 mg via ORAL
  Filled 2016-05-18 (×2): qty 1

## 2016-05-18 MED ORDER — DIPHENHYDRAMINE HCL 25 MG PO CAPS
25.0000 mg | ORAL_CAPSULE | Freq: Every evening | ORAL | Status: DC | PRN
  Administered 2016-05-19: 25 mg via ORAL
  Filled 2016-05-18: qty 1

## 2016-05-18 MED ORDER — ONDANSETRON HCL 4 MG/2ML IJ SOLN
4.0000 mg | Freq: Four times a day (QID) | INTRAMUSCULAR | Status: DC | PRN
  Administered 2016-05-18: 19:00:00 4 mg via INTRAVENOUS
  Filled 2016-05-18: qty 2

## 2016-05-18 MED ORDER — ALLOPURINOL 100 MG PO TABS
100.0000 mg | ORAL_TABLET | Freq: Every day | ORAL | Status: DC
  Administered 2016-05-18 – 2016-05-22 (×5): 100 mg via ORAL
  Filled 2016-05-18 (×5): qty 1

## 2016-05-18 MED ORDER — MYCOPHENOLATE SODIUM 180 MG PO TBEC
180.0000 mg | DELAYED_RELEASE_TABLET | Freq: Two times a day (BID) | ORAL | Status: DC
Start: 2016-05-18 — End: 2016-05-19
  Administered 2016-05-18 – 2016-05-19 (×2): 180 mg via ORAL
  Filled 2016-05-18 (×3): qty 1

## 2016-05-18 MED ORDER — SODIUM CHLORIDE 0.9 % IV BOLUS (SEPSIS)
500.0000 mL | Freq: Once | INTRAVENOUS | Status: AC
  Administered 2016-05-18: 500 mL via INTRAVENOUS

## 2016-05-18 MED ORDER — SODIUM CHLORIDE 0.9 % IV BOLUS (SEPSIS)
250.0000 mL | Freq: Once | INTRAVENOUS | Status: AC
Start: 2016-05-18 — End: 2016-05-18
  Administered 2016-05-18: 250 mL via INTRAVENOUS

## 2016-05-18 MED ORDER — HYPROMELLOSE (GONIOSCOPIC) 2.5 % OP SOLN
1.0000 [drp] | OPHTHALMIC | Status: DC | PRN
  Filled 2016-05-18: qty 15

## 2016-05-18 MED ORDER — MAGNESIUM OXIDE 400 (241.3 MG) MG PO TABS
400.0000 mg | ORAL_TABLET | Freq: Two times a day (BID) | ORAL | Status: DC
  Administered 2016-05-18 – 2016-05-19 (×2): 400 mg via ORAL
  Filled 2016-05-18 (×2): qty 1

## 2016-05-18 MED ORDER — ONDANSETRON HCL 4 MG PO TABS: 4.0000 mg | ORAL_TABLET | Freq: Four times a day (QID) | ORAL | Status: DC | PRN

## 2016-05-18 MED ORDER — PREDNISONE 5 MG PO TABS
5.0000 mg | ORAL_TABLET | Freq: Every day | ORAL | Status: DC
  Administered 2016-05-18 – 2016-05-22 (×5): 5 mg via ORAL
  Filled 2016-05-18 (×5): qty 1

## 2016-05-18 MED ORDER — BOOST / RESOURCE BREEZE PO LIQD
1.0000 | Freq: Three times a day (TID) | ORAL | Status: DC
  Administered 2016-05-18 – 2016-05-20 (×5): 1 via ORAL

## 2016-05-18 MED ORDER — ACETAMINOPHEN 325 MG PO TABS: 650.0000 mg | ORAL_TABLET | Freq: Four times a day (QID) | ORAL | Status: DC | PRN

## 2016-05-18 MED ORDER — OXYCODONE HCL 5 MG PO TABS: 5.0000 mg | ORAL_TABLET | ORAL | Status: DC | PRN

## 2016-05-18 MED ORDER — HEPARIN SODIUM (PORCINE) 5000 UNIT/ML IJ SOLN
5000.0000 [IU] | Freq: Three times a day (TID) | INTRAMUSCULAR | Status: DC
  Administered 2016-05-18 – 2016-05-22 (×12): 5000 [IU] via SUBCUTANEOUS
  Filled 2016-05-18 (×12): qty 1

## 2016-05-18 MED ORDER — SODIUM CHLORIDE 0.9 % IV SOLN
INTRAVENOUS | Status: DC
  Administered 2016-05-18 – 2016-05-20 (×5): via INTRAVENOUS

## 2016-05-18 MED ORDER — TACROLIMUS 1 MG PO CAPS
3.0000 mg | ORAL_CAPSULE | Freq: Two times a day (BID) | ORAL | Status: DC
  Administered 2016-05-18 – 2016-05-22 (×8): 3 mg via ORAL
  Filled 2016-05-18 (×9): qty 3

## 2016-05-18 MED ORDER — TAMSULOSIN HCL 0.4 MG PO CAPS
0.4000 mg | ORAL_CAPSULE | Freq: Every day | ORAL | Status: DC
  Administered 2016-05-18 – 2016-05-22 (×5): 0.4 mg via ORAL
  Filled 2016-05-18 (×5): qty 1

## 2016-05-18 MED ORDER — DOCUSATE SODIUM 100 MG PO CAPS
100.0000 mg | ORAL_CAPSULE | Freq: Two times a day (BID) | ORAL | Status: DC
  Filled 2016-05-18: qty 1

## 2016-05-18 MED ORDER — ADULT MULTIVITAMIN W/MINERALS CH
1.0000 | ORAL_TABLET | Freq: Every day | ORAL | Status: DC
  Administered 2016-05-18 – 2016-05-22 (×5): 1 via ORAL
  Filled 2016-05-18 (×5): qty 1

## 2016-05-18 MED ORDER — SUCRALFATE 1 G PO TABS
1.0000 g | ORAL_TABLET | Freq: Four times a day (QID) | ORAL | Status: DC
  Administered 2016-05-18 – 2016-05-22 (×14): 1 g via ORAL
  Filled 2016-05-18 (×14): qty 1

## 2016-05-18 MED ORDER — PHENOL 1.4 % MT LIQD
1.0000 | OROMUCOSAL | Status: DC | PRN
  Administered 2016-05-20: 11:00:00 1 via OROMUCOSAL
  Filled 2016-05-18: qty 177

## 2016-05-18 MED ORDER — ENSURE ENLIVE PO LIQD
1.0000 | Freq: Three times a day (TID) | ORAL | Status: DC
  Administered 2016-05-18 (×2): 237 mL via ORAL

## 2016-05-18 MED ORDER — ACETAMINOPHEN 650 MG RE SUPP: 650.0000 mg | Freq: Four times a day (QID) | RECTAL | Status: DC | PRN

## 2016-05-18 MED ORDER — VANCOMYCIN HCL IN DEXTROSE 1-5 GM/200ML-% IV SOLN
1000.0000 mg | Freq: Once | INTRAVENOUS | Status: AC
  Administered 2016-05-18: 1000 mg via INTRAVENOUS
  Filled 2016-05-18: qty 200

## 2016-05-18 MED ORDER — PANTOPRAZOLE SODIUM 40 MG PO TBEC
40.0000 mg | DELAYED_RELEASE_TABLET | Freq: Every day | ORAL | Status: DC
  Administered 2016-05-18: 40 mg via ORAL
  Filled 2016-05-18: qty 1

## 2016-05-18 MED ORDER — POLYETHYLENE GLYCOL 3350 17 G PO PACK: 17.0000 g | PACK | Freq: Every day | ORAL | Status: DC | PRN

## 2016-05-18 NOTE — Progress Notes (Signed)
Pharmacy Antibiotic Note  Matthew Brown is a 75 y.o. male admitted on 05/18/2016 with Sepsis/Neutropenic fever.  Pharmacy has been consulted for vancomycin/cefepime dosing.  Plan: Vancomycin 1000mg  IV x 1 given in the ED. Will initiate Vancomycin 750mg  IV Q24h after 1st dose per stacked dose protocol. Will order Vancomycin trough level prior to the fifth dose. Goal trough 15-13mcg/mL.  VD=39.34 T1/2= 20.38 Ke=0.034 Stacked=12hr  Cefepime 2g IV x1 given in the ED. Will initiate cefepime 2g IV Q12h.    Height: 5\' 6"  (167.6 cm) Weight: 124 lb (56.2 kg) IBW/kg (Calculated) : 63.8  Temp (24hrs), Avg:97.8 F (36.6 C), Min:97.7 F (36.5 C), Max:97.8 F (36.6 C)   Recent Labs Lab 05/12/16 1220 05/13/16 0607 05/14/16 1125 05/18/16 1031 05/18/16 1045 05/18/16 1405  WBC  --  4.3  --  2.1*  --   --   CREATININE 1.19 1.21 1.06 1.20  --   --   LATICACIDVEN  --   --   --   --  1.2 0.3*    Estimated Creatinine Clearance: 41.6 mL/min (by C-G formula based on SCr of 1.2 mg/dL).    No Known Allergies  Antimicrobials this admission: Vancomycin 11/26 >>  Cefepime 11/26 >>   Dose adjustments this admission: Cefepime 2g Q12h  Microbiology results: 11/26 BCx:  11/26 UCx:   Thank you for allowing pharmacy to be a part of this patient's care.  Loree Fee 05/18/2016 4:08 PM

## 2016-05-18 NOTE — Progress Notes (Deleted)
[place] [facility] day:  05/18/2016   Chief Complaint: Matthew Brown is a 75 y.o. male with post-transplant lymphoproliferative disorder, stage IVBE diffuse large B cell lymphoma, who is seen for reassessment after interval hospitalization, currently day 11 s/p cycle #1 mini-RCHOP.  HPI:  The patient was last seen in the medical oncology clinic on 05/06/2016.  At that time, CT guided retroperitoneal node biopsy from 04/30/2016 was reviewed.  Pathology confirmed diffuse large B cell lymphoma.  Work-up was planned.  Labs revealed tumor lysis issues.  Decision was made to expedite evaluation and treatment.  He was admitted to the hospital.  He was admitted to [facility] from 05/06/2016 - 05/13/2016.  He had a port-a-cath placed (05/07/2016) and bone marrow aspirate and biopsy (05/08/2016).  Echo on 05/06/2016 revealed an EF of 55-60%.  Hepatitis testing negative.  Tumor lysis syndrome was well managed with fluids and allopurinol.  G6PD assay was normal.  He received Zometa on 05/13/2016 for hypercalcemia.  He received cycle #1 mini-RCHOP on 05/09/2016.  He tolerated his treatment well.  He received 5 days of prednisone 70 mg a day (last 05/14/2016).  MMF was discontinued.  Prograf level was 5.5 (3.0-8.0) on 05/08/2016.   Bone marrow aspirate and biopsy revealed multifocal marrow involvement by diffuse large B-cell lymphoma. There was variably cellular marrow for age (50% - 90%) with a patchy predominantly nodular large B-cell infiltrate, overall estimated to account for 20% of the core biopsy sample. There was adequate residual trilineage hematopoiesis with mild nonspecific dyserythropoiesis. There was patchy mild increase in reticulin. Storage iron was present. The immunohistochemical staining pattern of the B-cell infiltrate (CD10 +/-, BCL 6+, BCL-2 +) would suggest possible large cell transformation of follicular lymphoma.  Flow cytometry revealed no significant  immunophenotypic abnormalities or evidence of B-cell lymphoma.  Labs at discharge included a hematocrit of 24.7, hemoglobin 8, MCV 75, platelets 185,000, white count 4300 with an ANC of 4000.  BUN was 54, creatinine 1.21, calcium 11.2, ionized calcium 7.3 (4.5 - 5.6), and potassium 4.9 (after Kayexalate).  Labs on 05/14/2016 included a 139, potassium 5.0, BUN 51, creatinine 1.06, and calcium 11.6.  Past Medical History:  Diagnosis Date  . Benign prostatic hypertrophy   . Chronic headache 10/19/2015  . ED (erectile dysfunction)   . End stage renal disease ([place])   . Essential hypertension   . GERD (gastroesophageal reflux disease)   . GIB (gastrointestinal bleeding)    a. 08/8880 s/p R colic artery embolization;  b. 02/2015 EGD: duod ulcerative mass->Bx notable for coagulative necrosis - ? ischemia vs thrombosis-->coumadin d/c'd.  . Gout   . Hearing loss   . Hemorrhoids   . Hyperlipidemia   . Lymphoma ([place])   . Osteoarthrosis, unspecified whether generalized or localized, lower leg   . Persistent atrial fibrillation ([place])    a. CHA2DS2VASc = 3-->coumadin d/c'd 02/2015 2/2 recurrent GIB.  [name] Prostatitis   . Pulmonary hypertension    a. 10/2014 Echo: EF 60-65%, mild to mod MR, mildly dil LA, nl RV, PASP 65mHg.  .Marland KitchenRenal transplant recipient   . Ulcers of both great toes ([facility]     Past Surgical History:  Procedure Laterality Date  . BACK SURGERY    . ESOPHAGOGASTRODUODENOSCOPY  03/13/15   severe esophagitis, ulcerated mass  . HERNIA REPAIR  1974  . PERIPHERAL VASCULAR CATHETERIZATION N/A 05/07/2016   Procedure: PGlori LuisCath Insertion;  Surgeon: JAlgernon Huxley MD;  [facility];  Service: Cardiovascular;  Laterality: N/A;  . PROSTATE ABLATION    . STOMACH SURGERY     blood vessel burst  . THROAT SURGERY    . TOTAL KNEE ARTHROPLASTY      Family History  Problem Relation Age of Onset  . Cancer Mother     throat  . Diabetes Brother   . Heart disease Brother   .  Stroke Brother   . Hypertension Brother   . Diabetes Sister   . Heart disease Sister   . Hypertension Sister   . Diabetes Sister   . Diabetes Brother   . COPD Neg Hx   . Kidney disease Neg Hx   . Prostate cancer Neg Hx     Social History:  reports that he quit smoking about 37 years ago. His smoking use included Cigarettes. He has a 25.00 pack-year smoking history. He has never used smokeless tobacco. He reports that he does not drink alcohol or use drugs.  He stopped smoking in 1981.  He smoked 3 cigarettes/day.  He lives in [place].  The patient is accompanied by his wife, [name],  today.  Allergies: No Known Allergies  Current Medications: No current facility-administered medications for this visit.    No current outpatient prescriptions on file.   Facility-Administered Medications Ordered in Other Visits  Medication Dose Route Frequency Provider Last Rate Last Dose  . 0.9 %  sodium chloride infusion   Intravenous Continuous Demetrios Loll, MD      . acetaminophen (TYLENOL) tablet 650 mg  650 mg Oral Q6H PRN Demetrios Loll, MD       Or  . acetaminophen (TYLENOL) suppository 650 mg  650 mg Rectal Q6H PRN Demetrios Loll, MD      . albuterol (PROVENTIL) (2.5 MG/3ML) 0.083% nebulizer solution 2.5 mg  2.5 mg Nebulization Q2H PRN Demetrios Loll, MD      . allopurinol (ZYLOPRIM) tablet 100 mg  100 mg Oral Daily Demetrios Loll, MD   100 mg at 05/18/16 1708  . aspirin tablet 325 mg  325 mg Oral Daily Demetrios Loll, MD   325 mg at 05/18/16 1709  . ceFEPIme (MAXIPIME) 2 g in dextrose 5 % 50 mL IVPB  2 g Intravenous Q12H Loree Fee, RPH      . diphenhydrAMINE (BENADRYL) capsule 25 mg  25 mg Oral QHS PRN Demetrios Loll, MD      . docusate sodium (COLACE) capsule 100 mg  100 mg Oral BID Demetrios Loll, MD      . feeding supplement (BOOST / RESOURCE BREEZE) liquid 1 Container  1 Container Oral TID BM Demetrios Loll, MD   1 Container at 05/18/16 2027  . feeding supplement (ENSURE ENLIVE) (ENSURE ENLIVE) liquid 237 mL  1 Bottle Oral  TID BM Demetrios Loll, MD   237 mL at 05/18/16 2024  . finasteride (PROSCAR) tablet 5 mg  5 mg Oral Daily Demetrios Loll, MD   5 mg at 05/18/16 1709  . heparin injection 5,000 Units  5,000 Units Subcutaneous Q8H Demetrios Loll, MD      . HYDROcodone-acetaminophen (NORCO/VICODIN) 5-325 MG per tablet 1 tablet  1 tablet Oral Q4H PRN Demetrios Loll, MD   1 tablet at 05/18/16 1709  . hydroxypropyl methylcellulose / hypromellose (ISOPTO TEARS / GONIOVISC) 2.5 % ophthalmic solution 1 drop  1 drop Both Eyes PRN Demetrios Loll, MD      . lactulose (CHRONULAC) 10 GM/15ML solution 5-10 g  5-10 g Oral Daily PRN Demetrios Loll, MD      .  magnesium oxide (MAG-OX) tablet 400 mg  400 mg Oral BID Demetrios Loll, MD      . metoprolol tartrate (LOPRESSOR) tablet 25 mg  25 mg Oral BID Demetrios Loll, MD      . multivitamin with minerals tablet 1 tablet  1 tablet Oral Daily Demetrios Loll, MD   1 tablet at 05/18/16 1709  . mycophenolate (MYFORTIC) EC tablet 180 mg  180 mg Oral BID Demetrios Loll, MD      . ondansetron [facility]) tablet 4 mg  4 mg Oral Q6H PRN Demetrios Loll, MD       Or  . ondansetron [facility]) injection 4 mg  4 mg Intravenous Q6H PRN Demetrios Loll, MD   4 mg at 05/18/16 1927  . oxybutynin (DITROPAN-XL) 24 hr tablet 5 mg  5 mg Oral Daily Demetrios Loll, MD   5 mg at 05/18/16 2023  . pantoprazole (PROTONIX) EC tablet 40 mg  40 mg Oral Daily Demetrios Loll, MD   40 mg at 05/18/16 1709  . polyethylene glycol (MIRALAX / GLYCOLAX) packet 17 g  17 g Oral Daily PRN Demetrios Loll, MD      . predniSONE (DELTASONE) tablet 5 mg  5 mg Oral Daily Demetrios Loll, MD   5 mg at 05/18/16 1709  . sucralfate (CARAFATE) tablet 1 g  1 g Oral QID Demetrios Loll, MD   1 g at 05/18/16 2023  . tacrolimus (PROGRAF) capsule 3 mg  3 mg Oral BID Demetrios Loll, MD      . tamsulosin [facility]) capsule 0.4 mg  0.4 mg Oral Daily Demetrios Loll, MD   0.4 mg at 05/18/16 1709  . [START ON 05/19/2016] vancomycin (VANCOCIN) IVPB 750 mg/150 ml premix  750 mg Intravenous Q24H Loree Fee, [facility]        Review of Systems:   GENERAL:  Fatigue.  Off and on fevers and sweats.  Weight loss of 30 pounds. PERFORMANCE STATUS (ECOG):  1 HEENT:  No visual changes, sore throat, mouth sores or tenderness. Lungs: No shortness of breath or cough.  No hemoptysis. Cardiac:  No chest pain, palpitations, orthopnea, or PND. GI:  Poor appetite.  No nausea, vomiting, diarrhea, constipation, melena or hematochezia. GU:  Enlarged prostate.  Right sided groin pain.  No urgency, frequency, dysuria, or hematuria. Musculoskeletal:  Back pain and left hip pain.  No joint pain.  No muscle tenderness. Extremities:  No pain or swelling. Skin:  No rashes or skin changes. Neuro:  Chronic headaches.  No numbness or weakness, balance or coordination issues. Endocrine:  No diabetes, thyroid issues, hot flashes or night sweats. Psych:  No mood changes, depression or anxiety. Pain:  Groin pain. Review of systems:  All other systems reviewed and found to be negative.  Physical Exam: There were no vitals taken for this visit. GENERAL:  Thin elderly gentleman sitting in the exam room, uncomfortable. MENTAL STATUS:  Alert and oriented to person, place and time. HEAD:  [place].  Normocephalic, atraumatic, face symmetric, no Cushingoid features. EYES:  Glasses.  Brown eyes.  Pupils equal round and reactive to light and accomodation.  No conjunctivitis or scleral icterus. ENT:  Oropharynx clear without lesion.  Edentulous.  Tongue normal. Mucous membranes dry.  RESPIRATORY:  Clear to auscultation without rales, wheezes or rhonchi. CARDIOVASCULAR:  Regular rate and rhythm without murmur, rub or gallop. ABDOMEN:  Soft, non-tender, with active bowel sounds, and no hepatosplenomegaly.  No masses.   GU:  No testicular masses.  No erythema.  No hernia. SKIN:  No rashes, ulcers or lesions. EXTREMITIES:  No edema, no skin discoloration or tenderness.  No palpable cords. NEUROLOGICAL: Unremarkable. PSYCH:  Appropriate.   Admission on 05/18/2016   Component Date Value Ref Range Status  . Lactic Acid, Venous 05/18/2016 1.2  0.5 - 1.9 mmol/L Final  . Lactic Acid, Venous 05/18/2016 0.3* 0.5 - 1.9 mmol/L Final  . Sodium 05/18/2016 143  135 - 145 mmol/L Final  . Potassium 05/18/2016 3.8  3.5 - 5.1 mmol/L Final  . Chloride 05/18/2016 102  101 - 111 mmol/L Final  . CO2 05/18/2016 32  22 - 32 mmol/L Final  . Glucose, Bld 05/18/2016 142* 65 - 99 mg/dL Final  . BUN 05/18/2016 27* 6 - 20 mg/dL Final  . Creatinine, Ser 05/18/2016 1.20  0.61 - 1.24 mg/dL Final  . Calcium 05/18/2016 10.0  8.9 - 10.3 mg/dL Final  . Total Protein 05/18/2016 6.6  6.5 - 8.1 g/dL Final  . Albumin 05/18/2016 3.5  3.5 - 5.0 g/dL Final  . AST 05/18/2016 22  15 - 41 U/L Final  . ALT 05/18/2016 15* 17 - 63 U/L Final  . Alkaline Phosphatase 05/18/2016 125  38 - 126 U/L Final  . Total Bilirubin 05/18/2016 0.9  0.3 - 1.2 mg/dL Final  . GFR calc non Af Amer 05/18/2016 57* >60 mL/min Final  . GFR calc Af Amer 05/18/2016 >60  >60 mL/min Final   Comment: (NOTE) The eGFR has been calculated using the CKD EPI equation. This calculation has not been validated in all clinical situations. eGFR's persistently <60 mL/min signify possible Chronic Kidney Disease.   . Anion gap 05/18/2016 9  5 - 15 Final  . Troponin I 05/18/2016 0.06* <0.03 ng/mL Final   Comment: CRITICAL RESULT CALLED TO, READ BACK BY AND VERIFIED WITH MEGAN JONES AT 1127 05/18/16 DAS   . WBC 05/18/2016 2.1* 3.8 - 10.6 K/uL Final  . RBC 05/18/2016 3.93* 4.40 - 5.90 MIL/uL Final  . Hemoglobin 05/18/2016 9.5* 13.0 - 18.0 g/dL Final  . HCT 05/18/2016 29.3* 40.0 - 52.0 % Final  . MCV 05/18/2016 74.7* 80.0 - 100.0 fL Final  . MCH 05/18/2016 24.1* 26.0 - 34.0 pg Final  . MCHC 05/18/2016 32.3  32.0 - 36.0 g/dL Final  . RDW 05/18/2016 19.3* 11.5 - 14.5 % Final  . Platelets 05/18/2016 171  150 - 440 K/uL Final  . Neutrophils Relative % 05/18/2016 81  % Final  . Neutro Abs 05/18/2016 1.7  1.4 - 6.5 K/uL Final  .  Lymphocytes Relative 05/18/2016 8  % Final  . Lymphs Abs 05/18/2016 0.2* 1.0 - 3.6 K/uL Final  . Monocytes Relative 05/18/2016 8  % Final  . Monocytes Absolute 05/18/2016 0.2  0.2 - 1.0 K/uL Final  . Eosinophils Relative 05/18/2016 2  % Final  . Eosinophils Absolute 05/18/2016 0.0  0 - 0.7 K/uL Final  . Basophils Relative 05/18/2016 1  % Final  . Basophils Absolute 05/18/2016 0.0  0 - 0.1 K/uL Final  . Color, Urine 05/18/2016 YELLOW* YELLOW Final  . APPearance 05/18/2016 CLEAR* CLEAR Final  . Glucose, UA 05/18/2016 NEGATIVE  NEGATIVE mg/dL Final  . Bilirubin Urine 05/18/2016 NEGATIVE  NEGATIVE Final  . Ketones, ur 05/18/2016 1+* NEGATIVE mg/dL Final  . Specific Gravity, Urine 05/18/2016 1.017  1.005 - 1.030 Final  . Hgb urine dipstick 05/18/2016 NEGATIVE  NEGATIVE Final  . pH 05/18/2016 8.0  5.0 - 8.0 Final  . Protein, ur 05/18/2016 30* NEGATIVE mg/dL  Final  . Nitrite 05/18/2016 NEGATIVE  NEGATIVE Final  . Leukocytes, UA 05/18/2016 NEGATIVE  NEGATIVE Final  . RBC / HPF 05/18/2016 0-5  0 - 5 RBC/hpf Final  . WBC, UA 05/18/2016 0-5  0 - 5 WBC/hpf Final  . Bacteria, UA 05/18/2016 NONE SEEN  NONE SEEN Final  . Squamous Epithelial / LPF 05/18/2016 NONE SEEN  NONE SEEN Final  . Influenza A By PCR 05/18/2016 NEGATIVE  NEGATIVE Final  . Influenza B By PCR 05/18/2016 NEGATIVE  NEGATIVE Final   Comment: (NOTE) The Xpert Xpress Flu assay is intended as an aid in the diagnosis of  influenza and should not be used as a sole basis for treatment.  This  assay is FDA approved for nasopharyngeal swab specimens only. Nasal  washings and aspirates are unacceptable for Xpert Xpress Flu testing.   . Procalcitonin 05/18/2016 <0.10  ng/mL Final   Comment:        Interpretation: PCT (Procalcitonin) <= 0.5 ng/mL: Systemic infection (sepsis) is not likely. Local bacterial infection is possible. (NOTE)         ICU PCT Algorithm               Non ICU PCT Algorithm    ----------------------------      ------------------------------         PCT < 0.25 ng/mL                 PCT < 0.1 ng/mL     Stopping of antibiotics            Stopping of antibiotics       strongly encouraged.               strongly encouraged.    ----------------------------     ------------------------------       PCT level decrease by               PCT < 0.25 ng/mL       >= 80% from peak PCT       OR PCT 0.25 - 0.5 ng/mL          Stopping of antibiotics                                             encouraged.     Stopping of antibiotics           encouraged.    ----------------------------     ------------------------------       PCT level decrease by              PCT >= 0.25 ng/mL       < 80% from peak PCT        AND PCT >= 0.5 ng/mL            Continuin                          g antibiotics                                              encouraged.       Continuing antibiotics            encouraged.    ----------------------------     ------------------------------  PCT level increase compared          PCT > 0.5 ng/mL         with peak PCT AND          PCT >= 0.5 ng/mL             Escalation of antibiotics                                          strongly encouraged.      Escalation of antibiotics        strongly encouraged.   . Prothrombin Time 05/18/2016 15.3* 11.4 - 15.2 seconds Final  . INR 05/18/2016 1.20   Final  . aPTT 05/18/2016 32  24 - 36 seconds Final  . Troponin I 05/18/2016 0.05* <0.03 ng/mL Final   Comment: CRITICAL VALUE NOTED. VALUE IS CONSISTENT WITH PREVIOUSLY REPORTED/CALLED VALUE [facility]     Assessment:  Matthew Brown is a 75 y.o. male s/p renal transplant (2007) with a post-transplant lymphoproliferative disorder, stage IV diffuse large B cell lymphoma.  He presented with a 2-3 month history of progressive back pain superimposed on chronic back pain.    PET scan on 04/28/2016 revealed bulky intensely hypermetabolic periaortic upper abdominal and mesenteric adenopathy concerning for  high-grade lymphoma.  There was hypermetabolic liver metastasis.  There was hpermetabolic lesion involving the small bowel of the upper pelvis.  There were multiple sites of hypermetabolic skeletal metastasis. There was moderate volume right pneumothorax.  CT guided retroperitoneal node biopsy on 04/30/2016 revealed diffuse large B cell lymphoma.  Hepatitis B and C testing on 05/06/2016 were negative.  Echo on 05/06/2016 revealed an EF of 55-60%.  Bone marrow aspirate and biopsy on 05/08/2016 revealed multifocal marrow involvement by diffuse large B-cell lymphoma. There was variably cellular marrow for age (50% - 90%) with a patchy predominantly nodular large B-cell infiltrate, overall estimated to account for 20% of the core biopsy. There was adequate residual trilineage hematopoiesis with mild nonspecific dyserythropoiesis. There was patchy mild increase in reticulin. Storage iron was present. The immunohistochemical staining pattern of the B-cell infiltrate (CD10 +/-, BCL 6+, BCL-2 +) suggested possible large cell transformation of follicular lymphoma.  Flow cytometry revealed no significant immunophenotypic abnormalities or evidence of B-cell lymphoma.  Work-up on 04/15/2016 revealed the following normal studies: ferritin (343), iron saturation (6%), TIBC (241; low), B12 (525), folate (27).  Reticulocyte count was 2%.  LDH was 409.  Uric acid was 7.7 (4.4 - 7.6).  He was admitted at [facility] from 04/28/2016 - 04/30/2016 with a moderate volume right sided pneumothorax.  His pneumothorax improved spontaneously. Plain films of the right femur revealed the lucent bone lesion in the right femoral neck  was poorly characterized.  There was no evidence for an acute fracture.  PTH was 106 (high) 04/30/2016 with a calcium of 11.2.  Etiology was c/w primary hyperparathyroidism.  PTH-related polypeptide was < 1.1 on 04/30/2016.  He has a history of renal failure s/p renal transplant.  He is tacrolimus (Prograf), and  steroids  Creatinine has ranged between 1.43 - 1.93 in the past 6 months. Mycophenolate (MMF) was discontinued at diagnosis.  Prograf level was 5.5 (3.0-8.0) on 05/08/2016.   He is currently day 10 s/p cycle #1 mini-RCHOP (05/09/2016).  Symptomatically, he has new right groin pain.  He continues to lose weight.  Plan: 1.  Lequita Asal, MD  05/18/2016, 9:39 PM

## 2016-05-18 NOTE — ED Provider Notes (Addendum)
[facility] Emergency Department Provider Note    None    (approximate)  I have reviewed the triage vital signs and the nursing notes.   HISTORY  Chief Complaint Fever    HPI Matthew Brown is a 75 y.o. male with complex past medical history including status post renal transplant on immunotherapy as well as recent diagnosis of lymphoma just recently started chemotherapy presenting with fever 102 at home. Is also complaining of lower abdominal pain and diarrhea. No blood in his stools. Denies any nausea or vomiting. Denies any chest pain or shortness of breath. Has not taken anything for the fever. Called his oncologist who directed to the ER due to concern for sepsis as the patient is very complex and immunocompromise. Patient denies any other complaints at this time.   Past Medical History:  Diagnosis Date  . Benign prostatic hypertrophy   . Chronic headache 10/19/2015  . ED (erectile dysfunction)   . End stage renal disease ([place])   . Essential hypertension   . GERD (gastroesophageal reflux disease)   . GIB (gastrointestinal bleeding)    a. [id number] s/p R colic artery embolization;  b. 02/2015 EGD: duod ulcerative mass->Bx notable for coagulative necrosis - ? ischemia vs thrombosis-->coumadin d/c'd.  . Gout   . Hearing loss   . Hemorrhoids   . Hyperlipidemia   . Lymphoma ([place])   . Osteoarthrosis, unspecified whether generalized or localized, lower leg   . Persistent atrial fibrillation ([place])    a. CHA2DS2VASc = 3-->coumadin d/c'd 02/2015 2/2 recurrent GIB.  [name] Prostatitis   . Pulmonary hypertension    a. 10/2014 Echo: EF 60-65%, mild to mod MR, mildly dil LA, nl RV, PASP 68mmHg.  [name] Renal transplant recipient   . Ulcers of both great toes (HCC)    Family History  Problem Relation Age of Onset  . Cancer Mother     throat  . Diabetes Brother   . Heart disease Brother   . Stroke Brother   . Hypertension Brother   . Diabetes Sister   . Heart disease  Sister   . Hypertension Sister   . Diabetes Sister   . Diabetes Brother   . COPD Neg Hx   . Kidney disease Neg Hx   . Prostate cancer Neg Hx    Past Surgical History:  Procedure Laterality Date  . BACK SURGERY    . ESOPHAGOGASTRODUODENOSCOPY  03/13/15   severe esophagitis, ulcerated mass  . HERNIA REPAIR  1974  . PERIPHERAL VASCULAR CATHETERIZATION N/A 05/07/2016   Procedure: Glori Luis Cath Insertion;  Surgeon: Algernon Huxley, MD;  Location: [facility];  Service: Cardiovascular;  Laterality: N/A;  . PROSTATE ABLATION    . STOMACH SURGERY     blood vessel burst  . THROAT SURGERY    . TOTAL KNEE ARTHROPLASTY     Patient Active Problem List   Diagnosis Date Noted  . Carotid atherosclerosis, bilateral 05/09/2016  . Protein-calorie malnutrition, severe 05/08/2016  . Hypercalcemia 05/06/2016  . Renal transplant recipient 05/06/2016  . 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 Tumor lysis syndrome 05/06/2016  . High serum parathyroid hormone (PTH) 05/03/2016  . Pain in joint involving pelvic region and thigh 05/03/2016  . Diffuse large B cell lymphoma ([place]) 04/30/2016  . Abnormal positron emission tomography (PET) scan   . Anemia 04/29/2016  . Lymphoma ([place]) 04/28/2016  . Pneumothorax 04/28/2016  . Hyponatremia 04/28/2016  . Adenopathy 04/15/2016  . Bone disease 04/15/2016  .  Weight loss 04/15/2016  . Spinal stenosis of cervical region 11/21/2015  . Pleural effusion, right 11/21/2015  . Right groin pain 10/19/2015  . Chronic headache 10/19/2015  . Hip strain 10/09/2015  . BPH with obstruction/lower urinary tract symptoms 09/03/2015  . Prostatitis, chronic 09/03/2015  . Erectile dysfunction of organic origin 09/03/2015  . Difficulty urinating 08/14/2015  . Accumulation of fluid in tissues 07/18/2015  . Persistent atrial fibrillation ([place])   . Pain in finger of right hand 06/15/2015  . Eustachian tube dysfunction, left 06/06/2015  . Chronic  insomnia 06/06/2015  . H/O: upper GI bleed 06/06/2015  . Pleural cavity effusion 04/07/2015  . Abdominal pain, generalized 04/04/2015  . Pneumonia due to Haemophilus influenzae ([place]) 03/16/2015  . Encounter for therapeutic drug monitoring 07/27/2013  . Long term (current) use of anticoagulants 09/25/2010  . Hyperlipidemia 10/25/2008  . Essential hypertension 10/25/2008  . ATRIAL FIBRILLATION 10/25/2008  . GERD 10/25/2008  . RENAL FAILURE, END STAGE 10/25/2008  . Osteoarthrosis, unspecified whether generalized or localized, involving lower leg 10/25/2008  . BENIGN PROSTATIC HYPERTROPHY, HX OF 10/25/2008      Prior to Admission medications   Medication Sig Start Date End Date Taking? Authorizing Provider  acetaminophen (TYLENOL) 325 MG tablet Take 2 tablets (650 mg total) by mouth every 6 (six) hours as needed for mild pain (or Fever >/= 101). 05/13/16   Nicholes Mango, MD  albuterol (PROAIR HFA) 108 (90 BASE) MCG/ACT inhaler Inhale into the lungs. 02/20/14   Historical Provider, MD  allopurinol (ZYLOPRIM) 100 MG tablet Take 100 mg by mouth daily.      Historical Provider, MD  diphenhydrAMINE (BENADRYL) 25 mg capsule Take 1 capsule (25 mg total) by mouth at bedtime as needed for sleep. 05/13/16   Nicholes Mango, MD  docusate sodium (COLACE) 100 MG capsule Take 1 capsule (100 mg total) by mouth 2 (two) times daily. 05/13/16   Nicholes Mango, MD  feeding supplement (BOOST / RESOURCE BREEZE) LIQD Take 1 Container by mouth 3 (three) times daily between meals. 05/13/16   Nicholes Mango, MD  feeding supplement, ENSURE ENLIVE, (ENSURE ENLIVE) LIQD Take 237 mLs by mouth 3 (three) times daily between meals. 05/13/16   Nicholes Mango, MD  finasteride (PROSCAR) 5 MG tablet Take 5 mg by mouth daily.      Historical Provider, MD  furosemide (LASIX) 80 MG tablet  04/01/16   Historical Provider, MD  HYDROcodone-acetaminophen (NORCO) 5-325 MG tablet Take 1 tablet by mouth every 4 (four) hours as needed for moderate pain.  05/02/16   Lequita Asal, MD  hydroxypropyl methylcellulose (ISOPTO TEARS) 2.5 % ophthalmic solution Place 1 drop into both eyes as needed.     Historical Provider, MD  lactulose (CHRONULAC) 10 GM/15ML solution Take 7.5-15 mLs (5-10 g total) by mouth daily as needed for mild constipation. Patient not taking: Reported on 05/07/2016 11/14/15   Arnetha Courser, MD  magnesium oxide (MAG-OX) 400 (241.3 Mg) MG tablet TAKE 1 TABLET (400 MG TOTAL) BY MOUTH TWO (2) TIMES A DAY. 07/22/15   Historical Provider, MD  metoprolol tartrate (LOPRESSOR) 25 MG tablet TAKE 1 TABLET (25 MG TOTAL) BY MOUTH TWO (2) TIMES A DAY. 08/22/15   Historical Provider, MD  Multiple Vitamin (MULTIVITAMIN) tablet Take 1 tablet by mouth daily.      Historical Provider, MD  mycophenolate (MYFORTIC) 180 MG EC tablet Take 2 tablets twice a day, V42.0 kidney transplant, tx date 11/05/04, DAW1, brand medically necessary 10/13/14   Historical Provider, MD  omeprazole (PRILOSEC) 20 MG capsule Take 20 mg by mouth daily.  12/19/14   Historical Provider, MD  oxybutynin (DITROPAN-XL) 5 MG 24 hr tablet Take 5 mg by mouth daily.    Historical Provider, MD  polyethylene glycol (MIRALAX / GLYCOLAX) packet Take 17 g by mouth daily as needed (constipation). 05/13/16   Nicholes Mango, MD  predniSONE (DELTASONE) 5 MG tablet Take 5 mg by mouth daily with breakfast.  11/23/13   Historical Provider, MD  sucralfate (CARAFATE) 1 G tablet Take 1 g by mouth 4 (four) times daily. 04/18/15   Historical Provider, MD  tacrolimus (PROGRAF) 1 MG capsule Take 3 mg by mouth 2 (two) times daily. Reported on 08/16/2015    Historical Provider, MD  Tamsulosin HCl (FLOMAX) 0.4 MG CAPS Take 0.4 mg by mouth daily.      Historical Provider, MD    Allergies Patient has no known allergies.    Social History Social History  Substance Use Topics  . Smoking status: Former Smoker    Packs/day: 1.00    Years: 25.00    Types: Cigarettes    Quit date: 06/23/1978  . Smokeless  tobacco: Never Used  . Alcohol use No    Review of Systems Patient denies headaches, rhinorrhea, blurry vision, numbness, shortness of breath, chest pain, edema, cough, abdominal pain, nausea, vomiting, diarrhea, dysuria, fevers, rashes or hallucinations unless otherwise stated above in HPI. ____________________________________________   PHYSICAL EXAM:  VITAL SIGNS: Vitals:   05/18/16 1028  BP: (!) 151/70  Resp: 16  Temp: 97.8 F (36.6 C)    Constitutional: Alert frail and cachectic appearing, in no acute distress. Eyes: Conjunctivae are normal. PERRL. EOMI. Head: Atraumatic. Nose: No congestion/rhinnorhea. Mouth/Throat: Mucous membranes are moist.  Oropharynx non-erythematous. Neck: No stridor. Painless ROM. No cervical spine tenderness to palpation Hematological/Lymphatic/Immunilogical: No cervical lymphadenopathy. Cardiovascular: Normal rate, regular rhythm. Grossly normal heart sounds.  Good peripheral circulation. Respiratory: Normal respiratory effort.  No retractions. Lungs CTAB. Gastrointestinal: Soft and nontender. No distention. No abdominal bruits. No CVA tenderness. Genitourinary:  Musculoskeletal: No lower extremity tenderness nor edema.  No joint effusions. Neurologic:  Normal speech and language. No gross focal neurologic deficits are appreciated. No gait instability. Skin:  Skin is warm, dry and intact. No rash noted. Psychiatric: Mood and affect are normal. Speech and behavior are normal.  ____________________________________________   LABS (all labs ordered are listed, but only abnormal results are displayed)  Results for orders placed or performed during the hospital encounter of 05/18/16 (from the past 24 hour(s))  Comprehensive metabolic panel     Status: Abnormal   Collection Time: 05/18/16 10:31 AM  Result Value Ref Range   Sodium 143 135 - 145 mmol/L   Potassium 3.8 3.5 - 5.1 mmol/L   Chloride 102 101 - 111 mmol/L   CO2 32 22 - 32 mmol/L    Glucose, Bld 142 (H) 65 - 99 mg/dL   BUN 27 (H) 6 - 20 mg/dL   Creatinine, Ser 1.20 0.61 - 1.24 mg/dL   Calcium 10.0 8.9 - 10.3 mg/dL   Total Protein 6.6 6.5 - 8.1 g/dL   Albumin 3.5 3.5 - 5.0 g/dL   AST 22 15 - 41 U/L   ALT 15 (L) 17 - 63 U/L   Alkaline Phosphatase 125 38 - 126 U/L   Total Bilirubin 0.9 0.3 - 1.2 mg/dL   GFR calc non Af Amer 57 (L) >60 mL/min   GFR calc Af Amer >60 >60 mL/min   Anion  gap 9 5 - 15  Troponin I     Status: Abnormal   Collection Time: 05/18/16 10:31 AM  Result Value Ref Range   Troponin I 0.06 (HH) <0.03 ng/mL  CBC WITH DIFFERENTIAL     Status: Abnormal   Collection Time: 05/18/16 10:31 AM  Result Value Ref Range   WBC 2.1 (L) 3.8 - 10.6 K/uL   RBC 3.93 (L) 4.40 - 5.90 MIL/uL   Hemoglobin 9.5 (L) 13.0 - 18.0 g/dL   HCT 29.3 (L) 40.0 - 52.0 %   MCV 74.7 (L) 80.0 - 100.0 fL   MCH 24.1 (L) 26.0 - 34.0 pg   MCHC 32.3 32.0 - 36.0 g/dL   RDW 19.3 (H) 11.5 - 14.5 %   Platelets 171 150 - 440 K/uL   Neutrophils Relative % 81 %   Neutro Abs 1.7 1.4 - 6.5 K/uL   Lymphocytes Relative 8 %   Lymphs Abs 0.2 (L) 1.0 - 3.6 K/uL   Monocytes Relative 8 %   Monocytes Absolute 0.2 0.2 - 1.0 K/uL   Eosinophils Relative 2 %   Eosinophils Absolute 0.0 0 - 0.7 K/uL   Basophils Relative 1 %   Basophils Absolute 0.0 0 - 0.1 K/uL  Lactic acid, plasma     Status: None   Collection Time: 05/18/16 10:45 AM  Result Value Ref Range   Lactic Acid, Venous 1.2 0.5 - 1.9 mmol/L  Influenza panel by PCR (type A & B, H1N1)     Status: None   Collection Time: 05/18/16 10:47 AM  Result Value Ref Range   Influenza A By PCR NEGATIVE NEGATIVE   Influenza B By PCR NEGATIVE NEGATIVE  Urinalysis complete, with microscopic ([facility] only)     Status: Abnormal   Collection Time: 05/18/16 12:42 PM  Result Value Ref Range   Color, Urine YELLOW (A) YELLOW   APPearance CLEAR (A) CLEAR   Glucose, UA NEGATIVE NEGATIVE mg/dL   Bilirubin Urine NEGATIVE NEGATIVE   Ketones, ur 1+ (A) NEGATIVE  mg/dL   Specific Gravity, Urine 1.017 1.005 - 1.030   Hgb urine dipstick NEGATIVE NEGATIVE   pH 8.0 5.0 - 8.0   Protein, ur 30 (A) NEGATIVE mg/dL   Nitrite NEGATIVE NEGATIVE   Leukocytes, UA NEGATIVE NEGATIVE   RBC / HPF 0-5 0 - 5 RBC/hpf   WBC, UA 0-5 0 - 5 WBC/hpf   Bacteria, UA NONE SEEN NONE SEEN   Squamous Epithelial / LPF NONE SEEN NONE SEEN  Lactic acid, plasma     Status: Abnormal   Collection Time: 05/18/16  2:05 PM  Result Value Ref Range   Lactic Acid, Venous 0.3 (L) 0.5 - 1.9 mmol/L   ____________________________________________  EKG My review and personal interpretation at Time: 10:31   Indication: htn  Rate: 90  Rhythm: a fib with frequent pvcs Axis: normal Other: non specific st and t wave changes, otherwise normal intervals ____________________________________________  RADIOLOGY  I personally reviewed all radiographic images ordered to evaluate for the above acute complaints and reviewed radiology reports and findings.  These findings were personally discussed with the patient.  Please see medical record for radiology report.  ____________________________________________   PROCEDURES  Procedure(s) performed: none Procedures    Critical Care performed: no ____________________________________________   INITIAL IMPRESSION / ASSESSMENT AND PLAN / ED COURSE  Pertinent labs & imaging results that were available during my care of the patient were reviewed by me and considered in my medical decision making (see chart for  details).  DDX: sepsis, dehydration, pna, colitis, medication effect, rejection  KOVEN WOLFGRAMM is a 75 y.o. who presents to the ED with fevers to 102 at home. Patient with very complex presentation given his status as a renal transplant recipient on immunosuppressive therapies with recent diagnosis of lymphoma on chemotherapy. Patient currently is nontoxic appearing but very frail and cachectic. Does appear dehydrated. Currently afebrile.  Based on his fever at home we'll empirically treat for sepsis. His exam abdominal exam in the ER is reassuring. We'll obtain laboratory evaluation to evaluate for above complaints. Will provide IV fluids for resuscitation.  The patient will be placed on continuous pulse oximetry and telemetry for monitoring.  Laboratory evaluation will be sent to evaluate for the above complaints.     Clinical Course as of May 18 1606  Sun May 18, 2016  1327 Patient reassessed. Remains in no acute distress. Hemodynamic stable. IV antibiotics infusing. Repeat abdominal exam reassuring. Source of fever is uncertain at this time the patient is neutropenic and will require admission for further evaluation of fever in immunocompromised host.  Have discussed with the patient and available family all diagnostics and treatments performed thus far and all questions were answered to the best of my ability. The patient demonstrates understanding and agreement with plan.   [PR]    Clinical Course User Index [PR] Merlyn Lot, MD     ____________________________________________   FINAL CLINICAL IMPRESSION(S) / ED DIAGNOSES  Final diagnoses:  Fever, unspecified fever cause  Immunocompromised patient [facility])  Kidney transplant recipient      NEW MEDICATIONS STARTED DURING THIS VISIT:  New Prescriptions   No medications on file     Note:  This document was prepared using Dragon voice recognition software and may include unintentional dictation errors.    Merlyn Lot, MD 05/18/16 [place], MD 05/18/16 [phone]

## 2016-05-18 NOTE — H&P (Addendum)
[place] [facility] [place] NAME: Matthew Brown    MR#:  [id number]  DATE OF BIRTH:  [birth year]  DATE OF ADMISSION:  05/18/2016  PRIMARY CARE PHYSICIAN: Enid Derry, MD   REQUESTING/REFERRING PHYSICIAN: Merlyn Lot, MD  CHIEF COMPLAINT:   Chief Complaint  Patient presents with  . Fever   fever 102 at home today. HISTORY OF PRESENT ILLNESS:  Matthew Brown  is a 75 y.o. male with a known history of Lymphoma status post chemotherapy, ESRD status post the renal transplant, persistent A. fib, pulmonary hypertension and hypertension. He presents to the ED with fever 102 at home. He also complained of diarrhea but no abdominal pain, nausea or vomiting. According to his wife, the patient was constipated and got MiraLAX 3 days ago. He started to have diarrhea 2 days ago. They called his oncologist, who suggested him come to the ED due to concerns for sepsis as the patient isvery complex and immunocompromise. His urinalysis is normal and the chest x-ray is normal He was found neutropenic and treated with cefepime and vancomycin in ED.  PAST MEDICAL HISTORY:   Past Medical History:  Diagnosis Date  . Benign prostatic hypertrophy   . Chronic headache 10/19/2015  . ED (erectile dysfunction)   . End stage renal disease ([place])   . Essential hypertension   . GERD (gastroesophageal reflux disease)   . GIB (gastrointestinal bleeding)    a. [id number] s/p R colic artery embolization;  b. 02/2015 EGD: duod ulcerative mass->Bx notable for coagulative necrosis - ? ischemia vs thrombosis-->coumadin d/c'd.  . Gout   . Hearing loss   . Hemorrhoids   . Hyperlipidemia   . Lymphoma ([place])   . Osteoarthrosis, unspecified whether generalized or localized, lower leg   . Persistent atrial fibrillation ([place])    a. CHA2DS2VASc = 3-->coumadin d/c'd 02/2015 2/2 recurrent GIB.  [name] Prostatitis   . Pulmonary hypertension    a. 10/2014 Echo: EF 60-65%, mild to mod MR, mildly dil  LA, nl RV, PASP 48mmHg.  [name] Renal transplant recipient   . Ulcers of both great toes ([place])     PAST SURGICAL HISTORY:   Past Surgical History:  Procedure Laterality Date  . BACK SURGERY    . ESOPHAGOGASTRODUODENOSCOPY  03/13/15   severe esophagitis, ulcerated mass  . HERNIA REPAIR  1974  . PERIPHERAL VASCULAR CATHETERIZATION N/A 05/07/2016   Procedure: Glori Luis Cath Insertion;  Surgeon: Algernon Huxley, MD;  Location: [facility];  Service: Cardiovascular;  Laterality: N/A;  . PROSTATE ABLATION    . STOMACH SURGERY     blood vessel burst  . THROAT SURGERY    . TOTAL KNEE ARTHROPLASTY      SOCIAL HISTORY:   Social History  Substance Use Topics  . Smoking status: Former Smoker    Packs/day: 1.00    Years: 25.00    Types: Cigarettes    Quit date: 06/23/1978  . Smokeless tobacco: Never Used  . Alcohol use No    FAMILY HISTORY:   Family History  Problem Relation Age of Onset  . Cancer Mother     throat  . Diabetes Brother   . Heart disease Brother   . Stroke Brother   . Hypertension Brother   . Diabetes Sister   . Heart disease Sister   . Hypertension Sister   . Diabetes Sister   . Diabetes Brother   . COPD Neg Hx   . Kidney disease Neg Hx   .  Prostate cancer Neg Hx     DRUG ALLERGIES:  No Known Allergies  REVIEW OF SYSTEMS:   Review of Systems  Constitutional: Positive for chills, fever and malaise/fatigue.  HENT: Negative for congestion.   Eyes: Negative for blurred vision and double vision.  Respiratory: Negative for cough, shortness of breath and stridor.   Cardiovascular: Negative for chest pain and leg swelling.  Gastrointestinal: Positive for diarrhea. Negative for abdominal pain, blood in stool, melena, nausea and vomiting.  Genitourinary: Negative for dysuria and hematuria.  Musculoskeletal: Negative for back pain and joint pain.  Neurological: Negative for dizziness, focal weakness and loss of consciousness.  Psychiatric/Behavioral: Negative  for depression. The patient is not nervous/anxious.     MEDICATIONS AT HOME:   Prior to Admission medications   Medication Sig Start Date End Date Taking? Authorizing Provider  acetaminophen (TYLENOL) 325 MG tablet Take 2 tablets (650 mg total) by mouth every 6 (six) hours as needed for mild pain (or Fever >/= 101). 05/13/16  Yes Nicholes Mango, MD  albuterol (PROAIR HFA) 108 (90 BASE) MCG/ACT inhaler Inhale 1-2 puffs into the lungs every 4 (four) hours as needed.  02/20/14  Yes Historical Provider, MD  allopurinol (ZYLOPRIM) 100 MG tablet Take 100 mg by mouth daily.     Yes Historical Provider, MD  diphenhydrAMINE (BENADRYL) 25 mg capsule Take 1 capsule (25 mg total) by mouth at bedtime as needed for sleep. 05/13/16  Yes Nicholes Mango, MD  docusate sodium (COLACE) 100 MG capsule Take 1 capsule (100 mg total) by mouth 2 (two) times daily. 05/13/16  Yes Nicholes Mango, MD  finasteride (PROSCAR) 5 MG tablet Take 5 mg by mouth daily.     Yes Historical Provider, MD  HYDROcodone-acetaminophen (NORCO) 5-325 MG tablet Take 1 tablet by mouth every 4 (four) hours as needed for moderate pain. 05/02/16  Yes Lequita Asal, MD  hydroxypropyl methylcellulose (ISOPTO TEARS) 2.5 % ophthalmic solution Place 1 drop into both eyes as needed.    Yes Historical Provider, MD  lactulose (CHRONULAC) 10 GM/15ML solution Take 7.5-15 mLs (5-10 g total) by mouth daily as needed for mild constipation. 11/14/15  Yes Arnetha Courser, MD  magnesium oxide (MAG-OX) 400 (241.3 Mg) MG tablet TAKE 1 TABLET (400 MG TOTAL) BY MOUTH TWO (2) TIMES A DAY. 07/22/15  Yes Historical Provider, MD  metoprolol tartrate (LOPRESSOR) 25 MG tablet TAKE 1 TABLET (25 MG TOTAL) BY MOUTH TWO (2) TIMES A DAY. 08/22/15  Yes Historical Provider, MD  Multiple Vitamin (MULTIVITAMIN) tablet Take 1 tablet by mouth daily.     Yes Historical Provider, MD  mycophenolate (MYFORTIC) 180 MG EC tablet Take 2 tablets twice a day, V42.0 kidney transplant, tx date 11/05/04,  DAW1, brand medically necessary 10/13/14  Yes Historical Provider, MD  omeprazole (PRILOSEC) 20 MG capsule Take 20 mg by mouth daily.  12/19/14  Yes Historical Provider, MD  oxybutynin (DITROPAN-XL) 5 MG 24 hr tablet Take 5 mg by mouth daily.   Yes Historical Provider, MD  polyethylene glycol (MIRALAX / GLYCOLAX) packet Take 17 g by mouth daily as needed (constipation). 05/13/16  Yes Nicholes Mango, MD  predniSONE (DELTASONE) 5 MG tablet Take 5 mg by mouth daily.  11/23/13  Yes Historical Provider, MD  sucralfate (CARAFATE) 1 G tablet Take 1 g by mouth 4 (four) times daily. 04/18/15  Yes Historical Provider, MD  tacrolimus (PROGRAF) 1 MG capsule Take 3 mg by mouth 2 (two) times daily. Reported on 08/16/2015   Yes Historical  Provider, MD  Tamsulosin HCl (FLOMAX) 0.4 MG CAPS Take 0.4 mg by mouth daily.     Yes Historical Provider, MD  feeding supplement (BOOST / RESOURCE BREEZE) LIQD Take 1 Container by mouth 3 (three) times daily between meals. 05/13/16   Nicholes Mango, MD  feeding supplement, ENSURE ENLIVE, (ENSURE ENLIVE) LIQD Take 237 mLs by mouth 3 (three) times daily between meals. 05/13/16   Nicholes Mango, MD      VITAL SIGNS:  Blood pressure (!) 142/56, pulse 78, temperature 97.7 F (36.5 C), temperature source Oral, resp. rate 15, height 5\' 6"  (1.676 m), weight 124 lb (56.2 kg), SpO2 100 %.  PHYSICAL EXAMINATION:  Physical Exam  GENERAL:  75 y.o.-year-old patient lying in the bed with no acute distress.  EYES: Pupils equal, round, reactive to light and accommodation. No scleral icterus. Extraocular muscles intact.  HEENT: Head atraumatic, normocephalic. Oropharynx and nasopharynx clear. Moist oral mucosa. NECK:  Supple, no jugular venous distention. No thyroid enlargement, no tenderness.  LUNGS: Normal breath sounds bilaterally, no wheezing, rales,rhonchi or crepitation. No use of accessory muscles of respiration.  CARDIOVASCULAR: S1, S2 normal. No murmurs, rubs, or gallops.  ABDOMEN: Soft,  nontender, nondistended. Bowel sounds present. No organomegaly or mass.  EXTREMITIES: No pedal edema, cyanosis, or clubbing.  NEUROLOGIC: Cranial nerves II through XII are intact. Muscle strength 5/5 in all extremities. Sensation intact. Gait not checked.  PSYCHIATRIC: The patient is alert and oriented x 3.  SKIN: No obvious rash, lesion, or ulcer.   LABORATORY PANEL:   CBC  Recent Labs Lab 05/18/16 1031  WBC 2.1*  HGB 9.5*  HCT 29.3*  PLT 171   ------------------------------------------------------------------------------------------------------------------  Chemistries   Recent Labs Lab 05/18/16 1031  NA 143  K 3.8  CL 102  CO2 32  GLUCOSE 142*  BUN 27*  CREATININE 1.20  CALCIUM 10.0  AST 22  ALT 15*  ALKPHOS 125  BILITOT 0.9   ------------------------------------------------------------------------------------------------------------------  Cardiac Enzymes  Recent Labs Lab 05/18/16 1031  TROPONINI 0.06*   ------------------------------------------------------------------------------------------------------------------  RADIOLOGY:  Dg Chest Port 1 View  Result Date: 05/18/2016 CLINICAL DATA:  Fevers, recent chemotherapy EXAM: PORTABLE CHEST 1 VIEW COMPARISON:  05/11/2016 FINDINGS: Cardiac shadow is stable. Aortic calcifications are again noted. Right chest wall port is seen. Lungs are well aerated bilaterally. Previously seen right basilar changes have nearly completely resolved. Only minimal atelectasis remains. IMPRESSION: Minimal right basilar atelectasis. Electronically Signed   By: Inez Catalina M.D.   On: 05/18/2016 11:04      IMPRESSION AND PLAN:   Neutropenic fever with sepsis, unclear etiology. Continue antibiotics and the sepsis protocol, follow-up blood culture and the CBC.  Neutropenia, possible due to chemotherapy. Follow-up CBC and oncology consult. Neutropenic isolation.  Dehydration. IV fluid support.  Elevated troponin. Possible  due to demanding ischemia.  Start aspirin, follow-up troponin level.  Diarrhea. Possible due to MiraLAX.  Anemia of chronic disease. Stable.  All the records are reviewed and case discussed with ED provider. Management plans discussed with the patient, his wife and they are in agreement.  CODE STATUS: Full code  TOTAL TIME TAKING CARE OF THIS PATIENT: 56 minutes.    Demetrios Loll M.D on 05/18/2016 at 3:49 PM  Between 7am to 6pm - Pager - [phone]  After 6pm go to [email or web address] - Proofreader  [facility]  Office  [phone]  CC: Primary care physician; Enid Derry, MD   Note: This dictation was prepared with Dragon dictation along with  smaller phrase technology. Any transcriptional errors that result from this process are unintentional. 

## 2016-05-18 NOTE — ED Notes (Signed)
Levada Dy, RN attempted x 2 to start an IV, this RN placed one successful IV, and attempted one more, unable to draw 2nd set of blood cultures at this time.

## 2016-05-18 NOTE — ED Triage Notes (Signed)
Pt family member reports weakness and fever up to 102 starting Friday 2 days. Had chemo Friday a week ago. Dr. B per family told them to come to ED. Pt c/o lower abdominal pain.

## 2016-05-18 NOTE — Progress Notes (Signed)
[facility] made several attempts to fill this OR. Pt was being attended to by nurse on each occation.  Pt and [place] agreed that later this evening would be better for a visit. I will refer to the next on-call [facility].    05/18/16 1800  Clinical Encounter Type  Visited With Patient;Patient and family together  Visit Type Initial;Spiritual support  Referral From Nurse

## 2016-05-18 NOTE — Care Management CC44 (Signed)
Condition Code 44 Documentation Completed  Patient Details  Name: Matthew Brown MRN: [id number] Date of Birth: 11-13-1940   Condition Code 44 given:   yes Patient signature on Condition Code 44 notice:   yes Documentation of 2 MD's agreement:yes    Code 44 added to claim:   yes    Ival Bible, RN 05/18/2016, 8:35 PM

## 2016-05-18 NOTE — Care Management Obs Status (Signed)
[facility] NOTIFICATION   Patient Details  Name: NEYTHAN CAPERTON MRN: [id number] Date of Birth: September 19, 1940   Medicare Observation Status Notification Given:  Yes    Ival Bible, RN 05/18/2016, 8:34 PM

## 2016-05-18 NOTE — Progress Notes (Signed)
Pt admitted from the ED. VSS. Norco given once for LUQ pain with improvement. Poor appetite.

## 2016-05-18 NOTE — ED Notes (Signed)
Dr. Chen at bedside.

## 2016-05-18 NOTE — ED Notes (Signed)
Report given to Malka, RN 

## 2016-05-19 ENCOUNTER — Inpatient Hospital Stay: Payer: Commercial Managed Care - HMO | Admitting: Hematology and Oncology

## 2016-05-19 ENCOUNTER — Inpatient Hospital Stay: Payer: Commercial Managed Care - HMO

## 2016-05-19 DIAGNOSIS — T8610 Unspecified complication of kidney transplant: Secondary | ICD-10-CM

## 2016-05-19 DIAGNOSIS — Z79899 Other long term (current) drug therapy: Secondary | ICD-10-CM

## 2016-05-19 DIAGNOSIS — R109 Unspecified abdominal pain: Secondary | ICD-10-CM

## 2016-05-19 DIAGNOSIS — R112 Nausea with vomiting, unspecified: Secondary | ICD-10-CM

## 2016-05-19 DIAGNOSIS — M1990 Unspecified osteoarthritis, unspecified site: Secondary | ICD-10-CM

## 2016-05-19 DIAGNOSIS — M109 Gout, unspecified: Secondary | ICD-10-CM

## 2016-05-19 DIAGNOSIS — D709 Neutropenia, unspecified: Principal | ICD-10-CM

## 2016-05-19 DIAGNOSIS — D47Z1 Post-transplant lymphoproliferative disorder (PTLD): Secondary | ICD-10-CM

## 2016-05-19 DIAGNOSIS — C8330 Diffuse large B-cell lymphoma, unspecified site: Secondary | ICD-10-CM

## 2016-05-19 DIAGNOSIS — L98499 Non-pressure chronic ulcer of skin of other sites with unspecified severity: Secondary | ICD-10-CM

## 2016-05-19 DIAGNOSIS — Z7951 Long term (current) use of inhaled steroids: Secondary | ICD-10-CM

## 2016-05-19 DIAGNOSIS — N529 Male erectile dysfunction, unspecified: Secondary | ICD-10-CM

## 2016-05-19 DIAGNOSIS — N186 End stage renal disease: Secondary | ICD-10-CM

## 2016-05-19 DIAGNOSIS — I129 Hypertensive chronic kidney disease with stage 1 through stage 4 chronic kidney disease, or unspecified chronic kidney disease: Secondary | ICD-10-CM

## 2016-05-19 DIAGNOSIS — J9811 Atelectasis: Secondary | ICD-10-CM

## 2016-05-19 DIAGNOSIS — Z87891 Personal history of nicotine dependence: Secondary | ICD-10-CM

## 2016-05-19 DIAGNOSIS — N400 Enlarged prostate without lower urinary tract symptoms: Secondary | ICD-10-CM

## 2016-05-19 DIAGNOSIS — Z9221 Personal history of antineoplastic chemotherapy: Secondary | ICD-10-CM

## 2016-05-19 DIAGNOSIS — K219 Gastro-esophageal reflux disease without esophagitis: Secondary | ICD-10-CM

## 2016-05-19 DIAGNOSIS — Z940 Kidney transplant status: Secondary | ICD-10-CM

## 2016-05-19 DIAGNOSIS — R509 Fever, unspecified: Secondary | ICD-10-CM | POA: Diagnosis not present

## 2016-05-19 DIAGNOSIS — R197 Diarrhea, unspecified: Secondary | ICD-10-CM | POA: Diagnosis not present

## 2016-05-19 DIAGNOSIS — I272 Pulmonary hypertension, unspecified: Secondary | ICD-10-CM

## 2016-05-19 LAB — BASIC METABOLIC PANEL
ANION GAP: 4 — AB (ref 5–15)
BUN: 24 mg/dL — ABNORMAL HIGH (ref 6–20)
CALCIUM: 8.9 mg/dL (ref 8.9–10.3)
CO2: 25 mmol/L (ref 22–32)
Chloride: 112 mmol/L — ABNORMAL HIGH (ref 101–111)
Creatinine, Ser: 0.92 mg/dL (ref 0.61–1.24)
GFR calc non Af Amer: >60 mL/min (ref >60)
GLUCOSE: 127 mg/dL — AB (ref 65–99)
POTASSIUM: 4.8 mmol/L (ref 3.5–5.1)
Sodium: 141 mmol/L (ref 135–145)

## 2016-05-19 LAB — CBC
HEMATOCRIT: 26.5 % — AB (ref 40.0–52.0)
HEMOGLOBIN: 8.6 g/dL — AB (ref 13.0–18.0)
MCH: 24.8 pg — AB (ref 26.0–34.0)
MCHC: 32.5 g/dL (ref 32.0–36.0)
MCV: 76.4 fL — ABNORMAL LOW (ref 80.0–100.0)
Platelets: 139 10*3/uL — ABNORMAL LOW (ref 150–440)
RBC: 3.47 MIL/uL — AB (ref 4.40–5.90)
RDW: 19.3 % — ABNORMAL HIGH (ref 11.5–14.5)
WBC: 1.5 10*3/uL — ABNORMAL LOW (ref 3.8–10.6)

## 2016-05-19 LAB — URINE CULTURE: CULTURE: NO GROWTH

## 2016-05-19 LAB — MRSA PCR SCREENING: MRSA BY PCR: NEGATIVE

## 2016-05-19 MED ORDER — TBO-FILGRASTIM 300 MCG/0.5ML ~~LOC~~ SOSY
300.0000 ug | PREFILLED_SYRINGE | Freq: Once | SUBCUTANEOUS | Status: DC
  Administered 2016-05-19 – 2016-05-21 (×3): 300 ug via SUBCUTANEOUS
  Filled 2016-05-19 (×4): qty 0.5

## 2016-05-19 MED ORDER — PIPERACILLIN-TAZOBACTAM 3.375 G IVPB
3.3750 g | Freq: Three times a day (TID) | INTRAVENOUS | Status: DC
  Administered 2016-05-19: 11:00:00 3.375 g via INTRAVENOUS
  Filled 2016-05-19: qty 50

## 2016-05-19 MED ORDER — CEFEPIME-DEXTROSE 2 GM/50ML IV SOLR
2.0000 g | Freq: Two times a day (BID) | INTRAVENOUS | Status: DC
  Administered 2016-05-19 – 2016-05-22 (×6): 2 g via INTRAVENOUS
  Filled 2016-05-19 (×8): qty 50

## 2016-05-19 MED ORDER — MYCOPHENOLATE SODIUM 180 MG PO TBEC
360.0000 mg | DELAYED_RELEASE_TABLET | Freq: Two times a day (BID) | ORAL | Status: DC
  Administered 2016-05-19: 22:00:00 360 mg via ORAL
  Filled 2016-05-19: qty 2

## 2016-05-19 MED ORDER — DEXTROSE 5 % IV SOLN: 2.0000 g | Freq: Two times a day (BID) | INTRAVENOUS | Status: DC

## 2016-05-19 MED ORDER — METOPROLOL TARTRATE 25 MG PO TABS
12.5000 mg | ORAL_TABLET | Freq: Two times a day (BID) | ORAL | Status: DC
  Administered 2016-05-20: 10:00:00 12.5 mg via ORAL
  Filled 2016-05-19: qty 1

## 2016-05-19 MED ORDER — MAGIC MOUTHWASH
10.0000 mL | Freq: Four times a day (QID) | ORAL | Status: DC | PRN
  Administered 2016-05-19 (×2): 10 mL via ORAL
  Filled 2016-05-19 (×2): qty 10

## 2016-05-19 MED ORDER — PANTOPRAZOLE SODIUM 40 MG PO TBEC
40.0000 mg | DELAYED_RELEASE_TABLET | Freq: Two times a day (BID) | ORAL | Status: DC
  Administered 2016-05-19 – 2016-05-22 (×7): 40 mg via ORAL
  Filled 2016-05-19 (×7): qty 1

## 2016-05-19 NOTE — Progress Notes (Signed)
Pharmacy Antibiotic Note  Matthew Brown is a 75 y.o. male admitted on 05/18/2016 with Sepsis/Neutropenic fever.  Pharmacy has been consulted for vancomycin/zosyn dosing.  Plan: Vancomycin 1000mg  IV x 1 given in the ED. Will initiate Vancomycin 750mg  IV Q24h after 1st dose per stacked dose protocol. Will order Vancomycin trough level prior to the fifth dose. Goal trough 15-10mcg/mL.  VD=39.34 T1/2= 20.38 Ke=0.034 Stacked=12hr  Will initiate Zosyn 3.375g IV EI Q8hr.    Height: 5\' 6"  (167.6 cm) Weight: 122 lb 3.2 oz (55.4 kg) IBW/kg (Calculated) : 63.8  Temp (24hrs), Avg:97.7 F (36.5 C), Min:97.5 F (36.4 C), Max:97.8 F (36.6 C)   Recent Labs Lab 05/12/16 1220 05/13/16 0607 05/14/16 1125 05/18/16 1031 05/18/16 1045 05/18/16 1405 05/19/16 0432  WBC  --  4.3  --  2.1*  --   --  1.5*  CREATININE 1.19 1.21 1.06 1.20  --   --  0.92  LATICACIDVEN  --   --   --   --  1.2 0.3*  --     Estimated Creatinine Clearance: 53.5 mL/min (by C-G formula based on SCr of 0.92 mg/dL).    No Known Allergies  Antimicrobials this admission: Vancomycin 11/26 >>  Cefepime 11/26 >> 11/26 Zosyn 11/27 >>  Dose adjustments this admission:   Microbiology results: 11/26 BCx:  11/26 UCx:   Thank you for allowing pharmacy to be a part of this patient's care.  Loree Fee 05/19/2016 8:15 AM

## 2016-05-19 NOTE — Progress Notes (Signed)
[place] at [place] NAME: Matthew Brown    MR#:  [id number]  DATE OF BIRTH:  [birth year]  SUBJECTIVE:  CHIEF COMPLAINT:   Chief Complaint  Patient presents with  . Fever  The patient is 75 year old African-American male with past medical history significant for history of lymphoma, status post chemotherapy about 10 days ago, and stage renal disease, status post renal transplant, persistent atrial fibrillation, pulmonary hypertension, essential hypertension, who presents to the hospital with complaints of fever to 102 at home, diarrhea, nausea, vomiting. On arrival to the hospital. He was found to be neutropenic and was initiated on cefepime and vancomycin and admitted. His chest x-ray was unremarkable as well as his urinalysis. Blood cultures are negative so far. Patient continues to have epigastric abdominal pain, nausea, diarrhea  Review of Systems  Constitutional: Negative for chills, fever and weight loss.  HENT: Negative for congestion.   Eyes: Negative for blurred vision and double vision.  Respiratory: Negative for cough, sputum production, shortness of breath and wheezing.   Cardiovascular: Negative for chest pain, palpitations, orthopnea, leg swelling and PND.  Gastrointestinal: Positive for abdominal pain, diarrhea and nausea. Negative for blood in stool, constipation and vomiting.  Genitourinary: Negative for dysuria, frequency, hematuria and urgency.  Musculoskeletal: Negative for falls.  Neurological: Negative for dizziness, tremors, focal weakness and headaches.  Endo/Heme/Allergies: Does not bruise/bleed easily.  Psychiatric/Behavioral: Negative for depression. The patient does not have insomnia.     VITAL SIGNS: Blood pressure (!) 98/38, pulse 63, temperature 98.5 F (36.9 C), temperature source Oral, resp. rate 18, height 5\' 6"  (1.676 m), weight 55.4 kg (122 lb 3.2 oz), SpO2 93 %.  PHYSICAL EXAMINATION:   GENERAL:  75  y.o.-year-old patient lying in the bed with no acute distress.  EYES: Pupils equal, round, reactive to light and accommodation. No scleral icterus. Extraocular muscles intact.  HEENT: Head atraumatic, normocephalic. Oropharynx and nasopharynx clear.  NECK:  Supple, no jugular venous distention. No thyroid enlargement, no tenderness.  LUNGS: Normal breath sounds bilaterally, no wheezing, rales,rhonchi or crepitation. No use of accessory muscles of respiration.  CARDIOVASCULAR: S1, S2 normal. No murmurs, rubs, or gallops.  ABDOMEN: Soft, tender in the epigastric area but no rebound or guarding was noted, nondistended. Bowel sounds present. No organomegaly or mass.  EXTREMITIES: No pedal edema, cyanosis, or clubbing.  NEUROLOGIC: Cranial nerves II through XII are intact. Muscle strength 5/5 in all extremities. Sensation intact. Gait not checked.  PSYCHIATRIC: The patient is alert and oriented x 3.  SKIN: No obvious rash, lesion, or ulcer.   ORDERS/RESULTS REVIEWED:   CBC  Recent Labs Lab 05/13/16 0607 05/18/16 1031 05/19/16 0432  WBC 4.3 2.1* 1.5*  HGB 8.0* 9.5* 8.6*  HCT 24.7* 29.3* 26.5*  PLT 185 171 139*  MCV 75.0* 74.7* 76.4*  MCH 24.4* 24.1* 24.8*  MCHC 32.5 32.3 32.5  RDW 19.8* 19.3* 19.3*  LYMPHSABS 0.2* 0.2*  --   MONOABS 0.1* 0.2  --   EOSABS 0.0 0.0  --   BASOSABS 0.0 0.0  --    ------------------------------------------------------------------------------------------------------------------  Chemistries   Recent Labs Lab 05/13/16 0607 05/13/16 1258 05/14/16 1125 05/18/16 1031 05/19/16 0432  NA 137  --  139 143 141  K 5.9* 4.9 5.0 3.8 4.8  CL 110  --  106 102 112*  CO2 24  --  28 32 25  GLUCOSE 156*  --  127* 142* 127*  BUN 54*  --  51*  27* 24*  CREATININE 1.21  --  1.06 1.20 0.92  CALCIUM 11.2*  --  11.6* 10.0 8.9  AST  --   --   --  22  --   ALT  --   --   --  15*  --   ALKPHOS  --   --   --  125  --   BILITOT  --   --   --  0.9  --     ------------------------------------------------------------------------------------------------------------------ estimated creatinine clearance is 53.5 mL/min (by C-G formula based on SCr of 0.92 mg/dL). ------------------------------------------------------------------------------------------------------------------ No results for input(s): TSH, T4TOTAL, T3FREE, THYROIDAB in the last 72 hours.  Invalid input(s): FREET3  Cardiac Enzymes  Recent Labs Lab 05/18/16 1031 05/18/16 1711  TROPONINI 0.06* 0.05*   ------------------------------------------------------------------------------------------------------------------ Invalid input(s): POCBNP ---------------------------------------------------------------------------------------------------------------  RADIOLOGY: Dg Chest Port 1 View  Result Date: 05/18/2016 CLINICAL DATA:  Fevers, recent chemotherapy EXAM: PORTABLE CHEST 1 VIEW COMPARISON:  05/11/2016 FINDINGS: Cardiac shadow is stable. Aortic calcifications are again noted. Right chest wall port is seen. Lungs are well aerated bilaterally. Previously seen right basilar changes have nearly completely resolved. Only minimal atelectasis remains. IMPRESSION: Minimal right basilar atelectasis. Electronically Signed   By: Inez Catalina M.D.   On: 05/18/2016 11:04    EKG:  Orders placed or performed during the hospital encounter of 05/18/16  . ED EKG  . ED EKG  . ED EKG 12-Lead  . ED EKG 12-Lead    ASSESSMENT AND PLAN:  Active Problems:   Neutropenic fever ([place]) #1. Neutropenic fever, continue antibiotic therapy,following cultures, blood cultures are negative so far, urinalysis was unremarkable, patient is afebrile, oncology consultation is requested #2. Elevated troponin, likely demand ischemia, no further interventions, echocardiogram was done earlier this month, unremarkable #3. Neutropenia, oncology input is pending, follow in the morning #4 thrombocytopenia, follow in  the morning, likely infection related #5 hypotension, decrease metoprolol doses  Management plans discussed with the patient, family and they are in agreement.   DRUG ALLERGIES: No Known Allergies  CODE STATUS:     Code Status Orders        Start     Ordered   05/18/16 1546  Full code  Continuous     05/18/16 1545    Code Status History    Date Active Date Inactive Code Status Order ID Comments User Context   05/06/2016  4:23 PM 05/13/2016 11:03 PM Full Code [id number]  Dustin Flock, MD Inpatient   04/29/2016  1:24 AM 04/30/2016  9:35 PM Full Code [id number]  Lance Coon, MD Inpatient    Advance Directive Documentation   Flowsheet Row Most Recent Value  Type of Advance Directive  Living will  Pre-existing out of facility DNR order (yellow form or pink MOST form)  No data  "MOST" Form in Place?  No data      TOTAL TIME TAKING CARE OF THIS PATIENT: 40 minutes.    Theodoro Grist M.D on 05/19/2016 at 4:52 PM  Between 7am to 6pm - Pager - [phone]  After 6pm go to [email or web address] - password [email or web address]  Office  [phone]  CC: Primary care physician; Enid Derry, MD

## 2016-05-19 NOTE — Progress Notes (Signed)
Initial Nutrition Assessment  DOCUMENTATION CODES:   Severe malnutrition in context of chronic illness  INTERVENTION:  Will d/c Ensure Enlive at this time as patient is on Clear Liquid Diet.  Continue Boost Breeze po TID, each supplement provides 250 kcal and 9 grams of protein.  Will monitor for GI assessment and patient's swallowing ability and change nutrition intervention as needed.  NUTRITION DIAGNOSIS:   Increased nutrient needs related to catabolic illness, cancer and cancer related treatments as evidenced by estimated needs.  GOAL:   Patient will meet greater than or equal to 90% of their needs  MONITOR:   PO intake, Supplement acceptance, Diet advancement, Labs, Weight trends, I & O's  REASON FOR ASSESSMENT:   Malnutrition Screening Tool    ASSESSMENT:   75 y.o. male with a known history of Lymphoma status post chemotherapy, ESRD status post the renal transplant, persistent A. fib, pulmonary hypertension and hypertension. He presents to the ED with fever 102 at home. He also complained of diarrhea but no abdominal pain, nausea or vomiting. According to his wife, the patient was constipated and got MiraLAX 3 days ago. He started to have diarrhea 2 days ago. They called his oncologist, who suggested him come to the ED due to concerns for sepsis as the patient is very complex and immunocompromise.   -Patient is s/p cycle 1 of mini-RCHOP on 11/17.  Spoke with patient at bedside. He was very tired and fell asleep several times during assessment, so was not able to answer all questions. Patient reports his appetite is starting to come back now. Denies N/V, abdominal pain, taste changes, or constipation/diarrhea to Rd, though noted that on HPI he was admitted for fever and diarrhea. Patient does report that his throat is very sore, causing him difficulty swallowing. He was not able to provide much information on intake but reports he did have 2 meals yesterday. Per last  oncology note he had told Oncologist he was eating well.  Patient reports UBW of 160 lbs. He has lost 38 lbs (24% body weight) over 7 months, which is significant for time frame.  Medications reviewed and include: allopurinol, multivitamin with minerals daily, pantoprazole, Zosyn, prednisone 5 mg daily, tacrolimus 3 mg BID, NS @ 75 ml/hr.  Labs reviewed: Chloride 112, BUN 24, Glucose 127, Anion gap 4.   Nutrition-Focused physical exam completed. Findings are severe fat depletion, severe muscle depletion, and no edema.   Patient meets criteria for severe chronic malnutrition in setting of 24% weight loss in 7 months, severe fat depletion, severe muscle depletion.  Discussed with RN. Patient on CLD pending evaluation from GI.   Diet Order:  Diet clear liquid Room service appropriate? Yes; Fluid consistency: Thin  Skin:  Reviewed, no issues  Last BM:  05/18/2016  Height:   Ht Readings from Last 1 Encounters:  05/18/16 5\' 6"  (1.676 m)    Weight:   Wt Readings from Last 1 Encounters:  05/18/16 122 lb 3.2 oz (55.4 kg)    Ideal Body Weight:  70 kg  BMI:  Body mass index is 19.72 kg/m.  Estimated Nutritional Needs:   Kcal:  1660-1940 (30-35 kcal/kg)  Protein:  83-95 grams (1.5-1.7 grams/kg)  Fluid:  >/= 1.6 L/day (25 ml/kg is 1.3 L/day)  EDUCATION NEEDS:   Education needs no appropriate at this time  Willey Blade, MS, RD, LDN Pager: [phone] After Hours Pager: [phone]

## 2016-05-19 NOTE — Consult Note (Signed)
Lucilla Lame, MD [address] [place], [place] [address] Phone: [phone] Fax : [phone]  Consultation  Referring Provider:     No ref. provider found Primary Care Physician:  Enid Derry, MD Primary Gastroenterologist:  None         Reason for Consultation:     Nausea and vomiting with diarrhea  Date of Admission:  05/18/2016 Date of Consultation:  05/19/2016         HPI:   Matthew Brown is a 75 y.o. male who is 10 days post chemotherapy for lymphoma. The patient was admitted with a fever of 102 with diarrhea and nausea and vomiting and neutropenia. The patient now reports that he is not having any further diarrhea. The patient also states that his nausea and vomiting has gone better and is virtually gone. The patient was able to tolerate his clear liquid diet today.  Past Medical History:  Diagnosis Date  . Benign prostatic hypertrophy   . Chronic headache 10/19/2015  . ED (erectile dysfunction)   . End stage renal disease ([place])   . Essential hypertension   . GERD (gastroesophageal reflux disease)   . GIB (gastrointestinal bleeding)    a. [id number] s/p R colic artery embolization;  b. 02/2015 EGD: duod ulcerative mass->Bx notable for coagulative necrosis - ? ischemia vs thrombosis-->coumadin d/c'd.  . Gout   . Hearing loss   . Hemorrhoids   . Hyperlipidemia   . Lymphoma ([place])   . Osteoarthrosis, unspecified whether generalized or localized, lower leg   . Persistent atrial fibrillation ([place])    a. CHA2DS2VASc = 3-->coumadin d/c'd 02/2015 2/2 recurrent GIB.  [name] Prostatitis   . Pulmonary hypertension    a. 10/2014 Echo: EF 60-65%, mild to mod MR, mildly dil LA, nl RV, PASP 15mmHg.  [name] Renal transplant recipient   . Ulcers of both great toes [facility])     Past Surgical History:  Procedure Laterality Date  . BACK SURGERY    . ESOPHAGOGASTRODUODENOSCOPY  03/13/15   severe esophagitis, ulcerated mass  . HERNIA REPAIR  1974  . PERIPHERAL VASCULAR CATHETERIZATION  N/A 05/07/2016   Procedure: Glori Luis Cath Insertion;  Surgeon: Algernon Huxley, MD;  Location: [facility];  Service: Cardiovascular;  Laterality: N/A;  . PROSTATE ABLATION    . STOMACH SURGERY     blood vessel burst  . THROAT SURGERY    . TOTAL KNEE ARTHROPLASTY      Prior to Admission medications   Medication Sig Start Date End Date Taking? Authorizing Provider  acetaminophen (TYLENOL) 325 MG tablet Take 2 tablets (650 mg total) by mouth every 6 (six) hours as needed for mild pain (or Fever >/= 101). 05/13/16  Yes Nicholes Mango, MD  albuterol (PROAIR HFA) 108 (90 BASE) MCG/ACT inhaler Inhale 1-2 puffs into the lungs every 4 (four) hours as needed.  02/20/14  Yes Historical Provider, MD  allopurinol (ZYLOPRIM) 100 MG tablet Take 100 mg by mouth daily.     Yes Historical Provider, MD  diphenhydrAMINE (BENADRYL) 25 mg capsule Take 1 capsule (25 mg total) by mouth at bedtime as needed for sleep. 05/13/16  Yes Nicholes Mango, MD  docusate sodium (COLACE) 100 MG capsule Take 1 capsule (100 mg total) by mouth 2 (two) times daily. 05/13/16  Yes Nicholes Mango, MD  finasteride (PROSCAR) 5 MG tablet Take 5 mg by mouth daily.     Yes Historical Provider, MD  HYDROcodone-acetaminophen (NORCO) 5-325 MG tablet Take 1 tablet by  mouth every 4 (four) hours as needed for moderate pain. 05/02/16  Yes Lequita Asal, MD  hydroxypropyl methylcellulose (ISOPTO TEARS) 2.5 % ophthalmic solution Place 1 drop into both eyes as needed.    Yes Historical Provider, MD  lactulose (CHRONULAC) 10 GM/15ML solution Take 7.5-15 mLs (5-10 g total) by mouth daily as needed for mild constipation. 11/14/15  Yes Arnetha Courser, MD  magnesium oxide (MAG-OX) 400 (241.3 Mg) MG tablet TAKE 1 TABLET (400 MG TOTAL) BY MOUTH TWO (2) TIMES A DAY. 07/22/15  Yes Historical Provider, MD  metoprolol tartrate (LOPRESSOR) 25 MG tablet TAKE 1 TABLET (25 MG TOTAL) BY MOUTH TWO (2) TIMES A DAY. 08/22/15  Yes Historical Provider, MD  Multiple Vitamin  (MULTIVITAMIN) tablet Take 1 tablet by mouth daily.     Yes Historical Provider, MD  mycophenolate (MYFORTIC) 180 MG EC tablet Take 2 tablets twice a day, V42.0 kidney transplant, tx date 11/05/04, DAW1, brand medically necessary 10/13/14  Yes Historical Provider, MD  omeprazole (PRILOSEC) 20 MG capsule Take 20 mg by mouth daily.  12/19/14  Yes Historical Provider, MD  oxybutynin (DITROPAN-XL) 5 MG 24 hr tablet Take 5 mg by mouth daily.   Yes Historical Provider, MD  polyethylene glycol (MIRALAX / GLYCOLAX) packet Take 17 g by mouth daily as needed (constipation). 05/13/16  Yes Nicholes Mango, MD  predniSONE (DELTASONE) 5 MG tablet Take 5 mg by mouth daily.  11/23/13  Yes Historical Provider, MD  sucralfate (CARAFATE) 1 G tablet Take 1 g by mouth 4 (four) times daily. 04/18/15  Yes Historical Provider, MD  tacrolimus (PROGRAF) 1 MG capsule Take 3 mg by mouth 2 (two) times daily. Reported on 08/16/2015   Yes Historical Provider, MD  Tamsulosin HCl (FLOMAX) 0.4 MG CAPS Take 0.4 mg by mouth daily.     Yes Historical Provider, MD  feeding supplement (BOOST / RESOURCE BREEZE) LIQD Take 1 Container by mouth 3 (three) times daily between meals. 05/13/16   Nicholes Mango, MD  feeding supplement, ENSURE ENLIVE, (ENSURE ENLIVE) LIQD Take 237 mLs by mouth 3 (three) times daily between meals. 05/13/16   Nicholes Mango, MD    Family History  Problem Relation Age of Onset  . Cancer Mother     throat  . Diabetes Brother   . Heart disease Brother   . Stroke Brother   . Hypertension Brother   . Diabetes Sister   . Heart disease Sister   . Hypertension Sister   . Diabetes Sister   . Diabetes Brother   . COPD Neg Hx   . Kidney disease Neg Hx   . Prostate cancer Neg Hx      Social History  Substance Use Topics  . Smoking status: Former Smoker    Packs/day: 1.00    Years: 25.00    Types: Cigarettes    Quit date: 06/23/1978  . Smokeless tobacco: Never Used  . Alcohol use No    Allergies as of 05/18/2016  . (No  Known Allergies)    Review of Systems:    All systems reviewed and negative except where noted in HPI.   Physical Exam:  Vital signs in last 24 hours: Temp:  [97.3 F (36.3 C)-98.5 F (36.9 C)] 98.5 F (36.9 C) (11/27 1220) Pulse Rate:  [61-68] 63 (11/27 1220) Resp:  [18-23] 18 (11/27 1220) BP: (98-130)/(36-49) 98/38 (11/27 1220) SpO2:  [93 %-98 %] 93 % (11/27 1220) Last BM Date: 05/18/16 General:   Pleasant, cooperative in NAD Head:  Normocephalic and atraumatic. Abdomen : Soft nontender nondistended without hepatomegaly  Eyes:   No icterus.   Conjunctiva pink. PERRLA. Extremities:  Without edema, cyanosis or clubbing. Neurologic:  Alert and oriented x3;  grossly normal neurologically. Skin:  Intact without significant lesions or rashes. Cervical Nodes:  No significant cervical adenopathy. Psych:  Alert and cooperative. Normal affect.  LAB RESULTS:  Recent Labs  05/18/16 1031 05/19/16 0432  WBC 2.1* 1.5*  HGB 9.5* 8.6*  HCT 29.3* 26.5*  PLT 171 139*   BMET  Recent Labs  05/18/16 1031 05/19/16 0432  NA 143 141  K 3.8 4.8  CL 102 112*  CO2 32 25  GLUCOSE 142* 127*  BUN 27* 24*  CREATININE 1.20 0.92  CALCIUM 10.0 8.9   LFT  Recent Labs  05/18/16 1031  PROT 6.6  ALBUMIN 3.5  AST 22  ALT 15*  ALKPHOS 125  BILITOT 0.9   PT/INR  Recent Labs  05/18/16 1711  LABPROT 15.3*  INR 1.20    STUDIES: Dg Chest Port 1 View  Result Date: 05/18/2016 CLINICAL DATA:  Fevers, recent chemotherapy EXAM: PORTABLE CHEST 1 VIEW COMPARISON:  05/11/2016 FINDINGS: Cardiac shadow is stable. Aortic calcifications are again noted. Right chest wall port is seen. Lungs are well aerated bilaterally. Previously seen right basilar changes have nearly completely resolved. Only minimal atelectasis remains. IMPRESSION: Minimal right basilar atelectasis. Electronically Signed   By: Inez Catalina M.D.   On: 05/18/2016 11:04      Impression / Plan:   Matthew Brown is a 75 y.o.  y/o male with Nausea vomiting diarrhea on admission with a fever of 102 diagnosed with neutropenic fever. The patient states that all of his diarrhea and nausea and vomiting have completely resolved. The patient has been told to contact the hospitalist that his symptoms return and then I will see him again. Otherwise nothing further to do from a GI point of view.  Thank you for involving me in the care of this patient.      LOS: 1 day   Lucilla Lame, MD  05/19/2016, 5:26 PM   Note: This dictation was prepared with Dragon dictation along with smaller phrase technology. Any transcriptional errors that result from this process are unintentional.

## 2016-05-19 NOTE — Progress Notes (Signed)
Pharmacy Antibiotic Note  Matthew Brown is a 75 y.o. male admitted on 05/18/2016 with Sepsis/Neutropenic fever.  Pharmacy has been consulted for vancomycin/cefepime dosing.  Patient had previous cefepime and vancomycin consult. Cefepime then changed to Zosyn. With patients PMH of renal transplant. Cefepime maybe a better option than the nephrotoxicity with vancomycin/Zosyn.   Plan: Vancomycin 1000mg  IV x 1 given in the ED. Will initiate Vancomycin 750mg  IV Q24h after 1st dose per stacked dose protocol. Will order Vancomycin trough level prior to the fifth dose. Goal trough 15-3mcg/mL.  VD=39.34 T1/2= 20.38 Ke=0.034 Stacked=12hr  Will reinitiate cefepime 2g IV Q12h. Continue to monitor for renal function.    Height: 5\' 6"  (167.6 cm) Weight: 122 lb 3.2 oz (55.4 kg) IBW/kg (Calculated) : 63.8  Temp (24hrs), Avg:97.7 F (36.5 C), Min:97.3 F (36.3 C), Max:98.5 F (36.9 C)   Recent Labs Lab 05/13/16 0607 05/14/16 1125 05/18/16 1031 05/18/16 1045 05/18/16 1405 05/19/16 0432  WBC 4.3  --  2.1*  --   --  1.5*  CREATININE 1.21 1.06 1.20  --   --  0.92  LATICACIDVEN  --   --   --  1.2 0.3*  --     Estimated Creatinine Clearance: 53.5 mL/min (by C-G formula based on SCr of 0.92 mg/dL).    No Known Allergies  Antimicrobials this admission: Vancomycin 11/26 >>  Cefepime 11/26 >>  Zosyn 11/27 >>11/27  Dose adjustments this admission:   Microbiology results: 11/26 BCx: NGTD 11/26 UCx: NGTD  Thank you for allowing pharmacy to be a part of this patient's care.  Loree Fee, PharmD 05/19/2016 5:36 PM

## 2016-05-20 LAB — RETICULOCYTES
RBC.: 2.95 MIL/uL — ABNORMAL LOW (ref 4.40–5.90)
RETIC COUNT ABSOLUTE: 135.7 10*3/uL (ref 19.0–183.0)
RETIC CT PCT: 4.6 % — AB (ref 0.4–3.1)

## 2016-05-20 LAB — CBC WITH DIFFERENTIAL/PLATELET
Basophils Absolute: 0 10*3/uL (ref 0–0.1)
Basophils Relative: 0 %
Eosinophils Absolute: 0 10*3/uL (ref 0–0.7)
Eosinophils Relative: 2 %
HCT: 22.3 % — ABNORMAL LOW (ref 40.0–52.0)
Hemoglobin: 7.1 g/dL — ABNORMAL LOW (ref 13.0–18.0)
Lymphocytes Relative: 12 %
Lymphs Abs: 0.2 10*3/uL — ABNORMAL LOW (ref 1.0–3.6)
MCH: 23.8 pg — ABNORMAL LOW (ref 26.0–34.0)
MCHC: 31.8 g/dL — ABNORMAL LOW (ref 32.0–36.0)
MCV: 74.8 fL — ABNORMAL LOW (ref 80.0–100.0)
Monocytes Absolute: 0.3 10*3/uL (ref 0.2–1.0)
Monocytes Relative: 18 %
Neutro Abs: 0.9 10*3/uL — ABNORMAL LOW (ref 1.4–6.5)
Neutrophils Relative %: 68 %
Platelets: 114 10*3/uL — ABNORMAL LOW (ref 150–440)
RBC: 2.98 MIL/uL — ABNORMAL LOW (ref 4.40–5.90)
RDW: 19.3 % — ABNORMAL HIGH (ref 11.5–14.5)
WBC: 1.4 10*3/uL — CL (ref 3.8–10.6)

## 2016-05-20 LAB — FERRITIN: Ferritin: 265 ng/mL (ref 24–336)

## 2016-05-20 LAB — HEMOGLOBIN AND HEMATOCRIT, BLOOD
HEMATOCRIT: 25.8 % — AB (ref 40.0–52.0)
HEMOGLOBIN: 8.2 g/dL — AB (ref 13.0–18.0)

## 2016-05-20 LAB — IRON AND TIBC
Iron: 28 ug/dL — ABNORMAL LOW (ref 45–182)
Saturation Ratios: 15 % — ABNORMAL LOW (ref 17.9–39.5)
TIBC: 192 ug/dL — ABNORMAL LOW (ref 250–450)
UIBC: 164 ug/dL

## 2016-05-20 LAB — PREPARE RBC (CROSSMATCH)

## 2016-05-20 MED ORDER — SODIUM CHLORIDE 0.9 % IV SOLN
Freq: Once | INTRAVENOUS | Status: AC
  Administered 2016-05-20: 14:00:00 via INTRAVENOUS

## 2016-05-20 MED ORDER — MAGIC MOUTHWASH
10.0000 mL | Freq: Three times a day (TID) | ORAL | Status: DC
  Administered 2016-05-20 – 2016-05-22 (×9): 10 mL via ORAL
  Filled 2016-05-20 (×9): qty 10

## 2016-05-20 MED ORDER — MAGIC MOUTHWASH: 10.0000 mL | Freq: Four times a day (QID) | ORAL | Status: DC | PRN

## 2016-05-20 NOTE — Evaluation (Signed)
Physical Therapy Evaluation Patient Details Name: Matthew Brown MRN: [id number] DOB: September 05, 1940 Today's Date: 05/20/2016   History of Present Illness  Pt is a 75 y.o. male presenting to hospital with fever, lower abdominal pain, and diarrhea.  Pt admitted to hospital with neutropenic fever with sepsis and elevated troponin (likely demand ischemia per notes).  PMH includes recent diagnosis lymphoma (on chemo), hearing loss, TKA, hernia repair, and s/p renal transplant on immunotherapy.    Clinical Impression  Prior to hospital admission, pt was ambulating with SPC and pt only ambulated when his wife was around for safety (otherwise would stay in bed and use urinal).  Pt lives with his wife in 1 level home with 3-4 STE with R railing.  Currently pt is supervision with transfers and CGA ambulating short distance in room.  Pt utilized B UE support on IV pole for ambulation and anticipate pt would benefit from use of RW.  Pt would benefit from skilled PT to address noted impairments and functional limitations.  Recommend pt discharge to home with 24/7 assist of wife as needed, use of RW, and HHPT when medically appropriate.    Follow Up Recommendations Home health PT;Supervision/Assistance - 24 hour    Equipment Recommendations   (pt already owns RW and [facility])    Recommendations for Other Services       Precautions / Restrictions Precautions Precautions: Fall Precaution Comments: Neutropenic precautions; port-a-cath Restrictions Weight Bearing Restrictions: No      Mobility  Bed Mobility               General bed mobility comments: Deferred d/t pt in bathroom upon PT entering room and pt sat on edge of bed to eat end of session.  Transfers Overall transfer level: Needs assistance Equipment used: None Transfers:  (Stand to sit ) Sit to Stand: Supervision         General transfer comment: pt standing in bathroom alone upon PT arrival with B UE support on IV pole; pt steady  sitting onto edge of bed  Ambulation/Gait Ambulation/Gait assistance: Min guard Ambulation Distance (Feet): 20 Feet (bathroom back to bed) Assistive device:  (B UE support on IV pole)   Gait velocity: decreased   General Gait Details: decreased B step length/foot clearance/heelstrike but steady with UE support on IV pole  Stairs            Wheelchair Mobility    Modified Rankin (Stroke Patients Only)       Balance Overall balance assessment: Needs assistance Sitting-balance support: No upper extremity supported;Feet supported Sitting balance-Leahy Scale: Normal     Standing balance support: Bilateral upper extremity supported (on IV pole) Standing balance-Leahy Scale: Good                               Pertinent Vitals/Pain Pain Assessment: No/denies pain    Home Living Family/patient expects to be discharged to:: Private residence Living Arrangements: Spouse/significant other Available Help at Discharge: Family;Available 24 hours/day Type of Home: House Home Access: Stairs to enter Entrance Stairs-Rails: Right Entrance Stairs-Number of Steps: 3-4 Home Layout: One level Home Equipment: Walker - 2 wheels;Cane - single point (urinal pt keeps at his bed)      Prior Function Level of Independence: Independent with assistive device(s);Needs assistance   Gait / Transfers Assistance Needed: Pt typically uses SPC but will use RW if feeling weak; pt's spouse present almost 24/7 and if she is  out for errands pt stays in bed and uses urinal; pt's wife with provide SBA if pt is feeling weak (otherwise supervision)     Comments: Pt denies any falls in past 6 months.     Hand Dominance        Extremity/Trunk Assessment   Upper Extremity Assessment: Generalized weakness           Lower Extremity Assessment: Generalized weakness         Communication   Communication: No difficulties  Cognition Arousal/Alertness: Awake/alert Behavior During  Therapy: WFL for tasks assessed/performed Overall Cognitive Status: Within Functional Limits for tasks assessed                      General Comments General comments (skin integrity, edema, etc.): Pt's wife present during session.  Nursing cleared pt for participation in physical therapy.  Pt agreeable to PT session.  Plan for blood transfusion later today.    Exercises     Assessment/Plan    PT Assessment Patient needs continued PT services  PT Problem List Decreased balance;Decreased strength;Decreased activity tolerance;Decreased mobility          PT Treatment Interventions DME instruction;Gait training;Stair training;Functional mobility training;Therapeutic activities;Therapeutic exercise;Balance training;Patient/family education    PT Goals (Current goals can be found in the Care Plan section)  Acute Rehab PT Goals Patient Stated Goal: to go home PT Goal Formulation: With patient/family Time For Goal Achievement: 06/03/16 Potential to Achieve Goals: Good    Frequency Min 2X/week   Barriers to discharge        Co-evaluation               End of Session Equipment Utilized During Treatment: Gait belt Activity Tolerance: Patient tolerated treatment well Patient left: with call bell/phone within reach;with bed alarm set;with family/visitor present (sitting on edge of bed) Nurse Communication: Mobility status;Precautions (pt sitting on edge of bed eating with bed alarm on)    Functional Assessment Tool Used: AM-PAC without stairs Functional Limitation: Mobility: Walking and moving around Mobility: Walking and Moving Around Current Status [id number]): At least 40 percent but less than 60 percent impaired, limited or restricted Mobility: Walking and Moving Around Goal Status [phone]): At least 1 percent but less than 20 percent impaired, limited or restricted    Time: [id number] PT Time Calculation (min) (ACUTE ONLY): 10 min   Charges:   PT Evaluation $PT  Eval Low Complexity: 1 Procedure     PT G Codes:   PT G-Codes **NOT FOR INPATIENT CLASS** Functional Assessment Tool Used: AM-PAC without stairs Functional Limitation: Mobility: Walking and moving around Mobility: Walking and Moving Around Current Status [id number]): At least 40 percent but less than 60 percent impaired, limited or restricted Mobility: Walking and Moving Around Goal Status [phone]): At least 1 percent but less than 20 percent impaired, limited or restricted    Leitha Bleak 05/20/2016, 2:54 PM Leitha Bleak, Browns

## 2016-05-20 NOTE — Plan of Care (Signed)
Problem: Education: Goal: Knowledge of [facility] Education information/materials will improve Outcome: Progressing Pt on  neupogen now. 1 unit prbcs today  hgb 8.2 post blood  Problem: Safety: Goal: Ability to remain free from injury will improve Outcome: Progressing Assisted to br  tol well  Problem: Nutrition: Goal: Adequate nutrition will be maintained Outcome: Progressing Full liqs now tol fair  Problem: Bowel/Gastric: Goal: Will not experience complications related to bowel motility Outcome: Progressing bm 11-27 passing gas

## 2016-05-20 NOTE — Consult Note (Signed)
[facility]  Date of admission:  05/18/2016  Inpatient day:  05/19/2016  Consulting physician: Dr. Demetrios Loll  Reason for Consultation:  Lymphoma, neutropenic fever  Chief Complaint: Matthew Brown is a 75 y.o. male with post transplant lymphoproliferative disorder (PTLD), stage IVBE diffuse large B cell lymphoma, who was admitted though the ER with fever and neutropenia.  HPI:  The patient was admitted to [facility] from 05/06/2016 -  05/13/2016 with tumor lysis syndrome.  He received cycle #1 mini-RCHOP on 05/09/2016.  He tolerated his treatment well.  He received Zometa prior to discharge secondary to hypercalcemia.    He presented to the ER with a fever off 102.  He notes some loose stool.  Previously he had been constipated and used Miralax.  He had some mild abdominal discomfort.  He denied any cough or urinary symptoms.   Labs in the ER were notable for a WBC of 2100 with an [place] of 1700.  Blood and urine cultures are negative.  CXR revealed minimal right basilar atelectasis.  He was started on vancomycin and Cefepime.  He has been afebrile.  Symptomatically, he is feeling better.   Past Medical History:  Diagnosis Date  . Benign prostatic hypertrophy   . Chronic headache 10/19/2015  . ED (erectile dysfunction)   . End stage renal disease ([place])   . Essential hypertension   . GERD (gastroesophageal reflux disease)   . GIB (gastrointestinal bleeding)    a. 11/1441 s/p R colic artery embolization;  b. 02/2015 EGD: duod ulcerative mass->Bx notable for coagulative necrosis - ? ischemia vs thrombosis-->coumadin d/c'd.  . Gout   . Hearing loss   . Hemorrhoids   . Hyperlipidemia   . Lymphoma ([place])   . Osteoarthrosis, unspecified whether generalized or localized, lower leg   . Persistent atrial fibrillation ([place])    a. CHA2DS2VASc = 3-->coumadin d/c'd 02/2015 2/2 recurrent GIB.  [name] Prostatitis   . Pulmonary hypertension    a. 10/2014 Echo: EF 60-65%, mild to mod MR, mildly  dil LA, nl RV, PASP 37mHg.  .Marland KitchenRenal transplant recipient   . Ulcers of both great toes ([facility]     Past Surgical History:  Procedure Laterality Date  . BACK SURGERY    . ESOPHAGOGASTRODUODENOSCOPY  03/13/15   severe esophagitis, ulcerated mass  . HERNIA REPAIR  1974  . PERIPHERAL VASCULAR CATHETERIZATION N/A 05/07/2016   Procedure: PGlori LuisCath Insertion;  Surgeon: JAlgernon Huxley MD;  [facility];  Service: Cardiovascular;  Laterality: N/A;  . PROSTATE ABLATION    . STOMACH SURGERY     blood vessel burst  . THROAT SURGERY    . TOTAL KNEE ARTHROPLASTY      Family History  Problem Relation Age of Onset  . Cancer Mother     throat  . Diabetes Brother   . Heart disease Brother   . Stroke Brother   . Hypertension Brother   . Diabetes Sister   . Heart disease Sister   . Hypertension Sister   . Diabetes Sister   . Diabetes Brother   . COPD Neg Hx   . Kidney disease Neg Hx   . Prostate cancer Neg Hx     Social History:  reports that he quit smoking about 37 years ago. His smoking use included Cigarettes. He has a 25.00 pack-year smoking history. He has never used smokeless tobacco. He reports that he does not drink alcohol or use drugs.  He is accompanied by  his brother and a friend.  Allergies: No Known Allergies  Medications Prior to Admission  Medication Sig Dispense Refill  . acetaminophen (TYLENOL) 325 MG tablet Take 2 tablets (650 mg total) by mouth every 6 (six) hours as needed for mild pain (or Fever >/= 101).    [name] albuterol (PROAIR HFA) 108 (90 BASE) MCG/ACT inhaler Inhale 1-2 puffs into the lungs every 4 (four) hours as needed.     [name] allopurinol (ZYLOPRIM) 100 MG tablet Take 100 mg by mouth daily.      . diphenhydrAMINE (BENADRYL) 25 mg capsule Take 1 capsule (25 mg total) by mouth at bedtime as needed for sleep. 30 capsule 0  . docusate sodium (COLACE) 100 MG capsule Take 1 capsule (100 mg total) by mouth 2 (two) times daily. 10 capsule 0  .  finasteride (PROSCAR) 5 MG tablet Take 5 mg by mouth daily.      [name] HYDROcodone-acetaminophen (NORCO) 5-325 MG tablet Take 1 tablet by mouth every 4 (four) hours as needed for moderate pain. 30 tablet 0  . hydroxypropyl methylcellulose (ISOPTO TEARS) 2.5 % ophthalmic solution Place 1 drop into both eyes as needed.     . lactulose (CHRONULAC) 10 GM/15ML solution Take 7.5-15 mLs (5-10 g total) by mouth daily as needed for mild constipation. 473 mL 1  . magnesium oxide (MAG-OX) 400 (241.3 Mg) MG tablet TAKE 1 TABLET (400 MG TOTAL) BY MOUTH TWO (2) TIMES A DAY.  11  . metoprolol tartrate (LOPRESSOR) 25 MG tablet TAKE 1 TABLET (25 MG TOTAL) BY MOUTH TWO (2) TIMES A DAY.  11  . Multiple Vitamin (MULTIVITAMIN) tablet Take 1 tablet by mouth daily.      . mycophenolate (MYFORTIC) 180 MG EC tablet Take 2 tablets twice a day, V42.0 kidney transplant, tx date 11/05/04, DAW1, brand medically necessary    . omeprazole (PRILOSEC) 20 MG capsule Take 20 mg by mouth daily.     [name] oxybutynin (DITROPAN-XL) 5 MG 24 hr tablet Take 5 mg by mouth daily.    . polyethylene glycol (MIRALAX / GLYCOLAX) packet Take 17 g by mouth daily as needed (constipation). 14 each 0  . predniSONE (DELTASONE) 5 MG tablet Take 5 mg by mouth daily.     . sucralfate (CARAFATE) 1 G tablet Take 1 g by mouth 4 (four) times daily.  3  . tacrolimus (PROGRAF) 1 MG capsule Take 3 mg by mouth 2 (two) times daily. Reported on 08/16/2015    . Tamsulosin HCl (FLOMAX) 0.4 MG CAPS Take 0.4 mg by mouth daily.      . feeding supplement (BOOST / RESOURCE BREEZE) LIQD Take 1 Container by mouth 3 (three) times daily between meals. 90 Container 0  . feeding supplement, ENSURE ENLIVE, (ENSURE ENLIVE) LIQD Take 237 mLs by mouth 3 (three) times daily between meals. 90 Bottle 0    Review of Systems: GENERAL:  Feeling better. Fever on admission.  Weight loss. PERFORMANCE STATUS (ECOG):  1 Lungs: No shortness of breath or cough. No hemoptysis. Cardiac: No chest  pain, palpitations, orthopnea, or PND. GI: Diarrhea prior to admission.  No nausea, vomiting, constipation, melena or hematochezia. GU: Enlarged prostate. No urgency, frequency, dysuria, or hematuria. Musculoskeletal: No bone or joint pain. No muscle tenderness. Extremities: No pain or swelling. Skin: No rashes or skin changes. Neuro: No numbness or weakness, balance or coordination issues. Endocrine: No diabetes, thyroid issues, hot flashes or night sweats. Psych: No mood changes, depression or anxiety. Pain: No pain. Review of  systems: All other systems reviewed and found to be negative.  Physical Exam:  Blood pressure (!) 114/50, pulse (!) 54, temperature 97.9 F (36.6 C), temperature source Oral, resp. rate 16, height 5' 6"  (1.676 m), weight 122 lb 3.2 oz (55.4 kg), SpO2 95 %.  GENERAL:Thin elderly gentleman lying sitting on the side of the bed eating dinner in no acute distress. MENTAL STATUS: Alert and oriented to person, place and time. HEAD:Gray goatee. Normocephalic, atraumatic, face symmetric, no Cushingoid features. EYES:Glasses. Brown eyes. Pupils equal round and reactive to light and accomodation. No conjunctivitis or scleral icterus. UYQ:[id number] clear without lesion. Edentulous. Tonguenormal. Mucous membranes dry. RESPIRATORY:Clear to auscultationwithout rales, wheezes or rhonchi. CARDIOVASCULAR:Regular rate andrhythmwithout murmur, rub or gallop. ABDOMEN:Soft, non-tender, with active bowel sounds, and no hepatosplenomegaly. No masses.  SKIN: No rashes, ulcers or lesions. EXTREMITIES: No edema, no skin discoloration or tenderness. No palpable cords. NEUROLOGICAL: Unremarkable. PSYCH: Appropriate.   Results for orders placed or performed during the hospital encounter of 05/18/16 (from the past 48 hour(s))  Comprehensive metabolic panel     Status: Abnormal   Collection Time: 05/18/16 10:31 AM  Result Value Ref Range   Sodium  143 135 - 145 mmol/L   Potassium 3.8 3.5 - 5.1 mmol/L   Chloride 102 101 - 111 mmol/L   CO2 32 22 - 32 mmol/L   Glucose, Bld 142 (H) 65 - 99 mg/dL   BUN 27 (H) 6 - 20 mg/dL   Creatinine, Ser 1.20 0.61 - 1.24 mg/dL   Calcium 10.0 8.9 - 10.3 mg/dL   Total Protein 6.6 6.5 - 8.1 g/dL   Albumin 3.5 3.5 - 5.0 g/dL   AST 22 15 - 41 U/L   ALT 15 (L) 17 - 63 U/L   Alkaline Phosphatase 125 38 - 126 U/L   Total Bilirubin 0.9 0.3 - 1.2 mg/dL   GFR calc non Af Amer 57 (L) >60 mL/min   GFR calc Af Amer >60 >60 mL/min    Comment: (NOTE) The eGFR has been calculated using the CKD EPI equation. This calculation has not been validated in all clinical situations. eGFR's persistently <60 mL/min signify possible Chronic Kidney Disease.    Anion gap 9 5 - 15  Troponin I     Status: Abnormal   Collection Time: 05/18/16 10:31 AM  Result Value Ref Range   Troponin I 0.06 (HH) <0.03 ng/mL    Comment: CRITICAL RESULT CALLED TO, READ BACK BY AND VERIFIED WITH MEGAN JONES AT 1127 05/18/16 DAS   CBC WITH DIFFERENTIAL     Status: Abnormal   Collection Time: 05/18/16 10:31 AM  Result Value Ref Range   WBC 2.1 (L) 3.8 - 10.6 K/uL   RBC 3.93 (L) 4.40 - 5.90 MIL/uL   Hemoglobin 9.5 (L) 13.0 - 18.0 g/dL   HCT 29.3 (L) 40.0 - 52.0 %   MCV 74.7 (L) 80.0 - 100.0 fL   MCH 24.1 (L) 26.0 - 34.0 pg   MCHC 32.3 32.0 - 36.0 g/dL   RDW 19.3 (H) 11.5 - 14.5 %   Platelets 171 150 - 440 K/uL   Neutrophils Relative % 81 %   Neutro Abs 1.7 1.4 - 6.5 K/uL   Lymphocytes Relative 8 %   Lymphs Abs 0.2 (L) 1.0 - 3.6 K/uL   Monocytes Relative 8 %   Monocytes Absolute 0.2 0.2 - 1.0 K/uL   Eosinophils Relative 2 %   Eosinophils Absolute 0.0 0 - 0.7 K/uL   Basophils Relative 1 %  Basophils Absolute 0.0 0 - 0.1 K/uL  Blood Culture (routine x 2)     Status: None (Preliminary result)   Collection Time: 05/18/16 10:31 AM  Result Value Ref Range   Specimen Description BLOOD LEFT ANTECUBITAL    Special Requests BOTTLES DRAWN  AEROBIC AND ANAEROBIC 8CC    Culture NO GROWTH < 24 HOURS    Report Status PENDING   Blood Culture (routine x 2)     Status: None (Preliminary result)   Collection Time: 05/18/16 10:36 AM  Result Value Ref Range   Specimen Description BLOOD  LEFT FOREARM    Special Requests BOTTLES DRAWN AEROBIC AND ANAEROBIC  10CC    Culture NO GROWTH < 24 HOURS    Report Status PENDING   Lactic acid, plasma     Status: None   Collection Time: 05/18/16 10:45 AM  Result Value Ref Range   Lactic Acid, Venous 1.2 0.5 - 1.9 mmol/L  Influenza panel by PCR (type A & B, H1N1)     Status: None   Collection Time: 05/18/16 10:47 AM  Result Value Ref Range   Influenza A By PCR NEGATIVE NEGATIVE   Influenza B By PCR NEGATIVE NEGATIVE    Comment: (NOTE) The Xpert Xpress Flu assay is intended as an aid in the diagnosis of  influenza and should not be used as a sole basis for treatment.  This  assay is FDA approved for nasopharyngeal swab specimens only. Nasal  washings and aspirates are unacceptable for Xpert Xpress Flu testing.   Urinalysis complete, with microscopic ([facility] only)     Status: Abnormal   Collection Time: 05/18/16 12:42 PM  Result Value Ref Range   Color, Urine YELLOW (A) YELLOW   APPearance CLEAR (A) CLEAR   Glucose, UA NEGATIVE NEGATIVE mg/dL   Bilirubin Urine NEGATIVE NEGATIVE   Ketones, ur 1+ (A) NEGATIVE mg/dL   Specific Gravity, Urine 1.017 1.005 - 1.030   Hgb urine dipstick NEGATIVE NEGATIVE   pH 8.0 5.0 - 8.0   Protein, ur 30 (A) NEGATIVE mg/dL   Nitrite NEGATIVE NEGATIVE   Leukocytes, UA NEGATIVE NEGATIVE   RBC / HPF 0-5 0 - 5 RBC/hpf   WBC, UA 0-5 0 - 5 WBC/hpf   Bacteria, UA NONE SEEN NONE SEEN   Squamous Epithelial / LPF NONE SEEN NONE SEEN  Urine culture     Status: None   Collection Time: 05/18/16 12:42 PM  Result Value Ref Range   Specimen Description URINE, RANDOM    Special Requests NONE    Culture NO GROWTH Performed at [facility]     Report Status  05/19/2016 FINAL   Lactic acid, plasma     Status: Abnormal   Collection Time: 05/18/16  2:05 PM  Result Value Ref Range   Lactic Acid, Venous 0.3 (L) 0.5 - 1.9 mmol/L  Procalcitonin     Status: None   Collection Time: 05/18/16  5:11 PM  Result Value Ref Range   Procalcitonin <0.10 ng/mL    Comment:        Interpretation: PCT (Procalcitonin) <= 0.5 ng/mL: Systemic infection (sepsis) is not likely. Local bacterial infection is possible. (NOTE)         ICU PCT Algorithm               Non ICU PCT Algorithm    ----------------------------     ------------------------------         PCT < 0.25 ng/mL  PCT < 0.1 ng/mL     Stopping of antibiotics            Stopping of antibiotics       strongly encouraged.               strongly encouraged.    ----------------------------     ------------------------------       PCT level decrease by               PCT < 0.25 ng/mL       >= 80% from peak PCT       OR PCT 0.25 - 0.5 ng/mL          Stopping of antibiotics                                             encouraged.     Stopping of antibiotics           encouraged.    ----------------------------     ------------------------------       PCT level decrease by              PCT >= 0.25 ng/mL       < 80% from peak PCT        AND PCT >= 0.5 ng/mL            Continuin g antibiotics                                              encouraged.       Continuing antibiotics            encouraged.    ----------------------------     ------------------------------     PCT level increase compared          PCT > 0.5 ng/mL         with peak PCT AND          PCT >= 0.5 ng/mL             Escalation of antibiotics                                          strongly encouraged.      Escalation of antibiotics        strongly encouraged.   Protime-INR     Status: Abnormal   Collection Time: 05/18/16  5:11 PM  Result Value Ref Range   Prothrombin Time 15.3 (H) 11.4 - 15.2 seconds   INR 1.20   APTT      Status: None   Collection Time: 05/18/16  5:11 PM  Result Value Ref Range   aPTT 32 24 - 36 seconds  Troponin I     Status: Abnormal   Collection Time: 05/18/16  5:11 PM  Result Value Ref Range   Troponin I 0.05 (HH) <0.03 ng/mL    Comment: CRITICAL VALUE NOTED. VALUE IS CONSISTENT WITH PREVIOUSLY REPORTED/CALLED VALUE [facility]   Basic metabolic panel     Status: Abnormal   Collection Time: 05/19/16  4:32 AM  Result Value Ref Range   Sodium 141 135 - 145 mmol/L   Potassium  4.8 3.5 - 5.1 mmol/L   Chloride 112 (H) 101 - 111 mmol/L   CO2 25 22 - 32 mmol/L   Glucose, Bld 127 (H) 65 - 99 mg/dL   BUN 24 (H) 6 - 20 mg/dL   Creatinine, Ser 0.92 0.61 - 1.24 mg/dL   Calcium 8.9 8.9 - 10.3 mg/dL   GFR calc non Af Amer >60 >60 mL/min   GFR calc Af Amer >60 >60 mL/min    Comment: (NOTE) The eGFR has been calculated using the CKD EPI equation. This calculation has not been validated in all clinical situations. eGFR's persistently <60 mL/min signify possible Chronic Kidney Disease.    Anion gap 4 (L) 5 - 15  CBC     Status: Abnormal   Collection Time: 05/19/16  4:32 AM  Result Value Ref Range   WBC 1.5 (L) 3.8 - 10.6 K/uL   RBC 3.47 (L) 4.40 - 5.90 MIL/uL   Hemoglobin 8.6 (L) 13.0 - 18.0 g/dL   HCT 26.5 (L) 40.0 - 52.0 %   MCV 76.4 (L) 80.0 - 100.0 fL   MCH 24.8 (L) 26.0 - 34.0 pg   MCHC 32.5 32.0 - 36.0 g/dL   RDW 19.3 (H) 11.5 - 14.5 %   Platelets 139 (L) 150 - 440 K/uL  MRSA PCR Screening     Status: None   Collection Time: 05/19/16  4:12 PM  Result Value Ref Range   MRSA by PCR NEGATIVE NEGATIVE    Comment:        The GeneXpert MRSA Assay (FDA approved for NASAL specimens only), is one component of a comprehensive MRSA colonization surveillance program. It is not intended to diagnose MRSA infection nor to guide or monitor treatment for MRSA infections.    Dg Chest Port 1 View  Result Date: 05/18/2016 CLINICAL DATA:  Fevers, recent chemotherapy EXAM: PORTABLE CHEST 1  VIEW COMPARISON:  05/11/2016 FINDINGS: Cardiac shadow is stable. Aortic calcifications are again noted. Right chest wall port is seen. Lungs are well aerated bilaterally. Previously seen right basilar changes have nearly completely resolved. Only minimal atelectasis remains. IMPRESSION: Minimal right basilar atelectasis. Electronically Signed   By: Inez Catalina M.D.   On: 05/18/2016 11:04    Assessment:  The patient is a 75 y.o.  gentleman with stage IVBE diffuse large B cell lymphoma. He has a post-transplant lymphoproliferative disorder (PTLD).  He is day 11 s/p cycle #1 mini-RCHOP (05/09/2016).  He was admitted with fever and neutropenia.  Blood and urine cultures are negative.  CXR was negative.  Diarrhea has resolved.  He is s/p renal transplant.  He has a history of tumor lysis syndrome.  He is on allopurinol.  Plan:   1.  Oncology:  Day 11 s/p cycle #1 mini-RCHOP.  He has neutropenia post chemotherapy.  Neutropenic precautions.  Neupogen/Granix initiated.  Patient will receive daily until [place] adequate.  Hematocrit and platelet count adequate.  CBC with diff daily.  2.  Infectious disease:  Neutropenic.  Cultures negatives.  Influenza by PCR negative.  Empiric vancomycin and Cefepime.  3.  Nephrology:  Patient is s/p renal transplant on Prograf and prednisone.  MMF was discontinued.  Renal function stable.  Calcium normal.   Thank you for allowing me to participate in ANISH VANA 's care.  I will follow him closely with you while hospitalized and after discharge in the outpatient department.  Lequita Asal, MD  05/19/2016

## 2016-05-20 NOTE — Progress Notes (Signed)
[facility] [place] NAME: Matthew Brown    MR#:  [id number]  DATE OF BIRTH:  02-Oct-1940  SUBJECTIVE:  CHIEF COMPLAINT:   Chief Complaint  Patient presents with  . Fever  The patient is 75 year old African-American male with past medical history significant for history of lymphoma, status post chemotherapy about 10 days ago, and stage renal disease, status post renal transplant, persistent atrial fibrillation, pulmonary hypertension, essential hypertension, who presents to the hospital with complaints of fever to 102 at home, diarrhea, nausea, vomiting. On arrival to the hospital. He was found to be neutropenic and was initiated on cefepime and vancomycin and admitted. His chest x-ray was unremarkable as well as his urinalysis. Blood cultures are negative so far. MRSA PCR was negative. Urine culture revealed no growth. Patient remains neutropenic. He was found to be anemic earlier today, blood transfusion is ordered, discussed with patient risks as well as benefits, he voiced agreement and understanding, he was agreeable to transfuse. He remains afebrile  Review of Systems  Constitutional: Negative for chills, fever and weight loss.  HENT: Negative for congestion.   Eyes: Negative for blurred vision and double vision.  Respiratory: Negative for cough, sputum production, shortness of breath and wheezing.   Cardiovascular: Negative for chest pain, palpitations, orthopnea, leg swelling and PND.  Gastrointestinal: Positive for abdominal pain, diarrhea and nausea. Negative for blood in stool, constipation and vomiting.  Genitourinary: Negative for dysuria, frequency, hematuria and urgency.  Musculoskeletal: Negative for falls.  Neurological: Negative for dizziness, tremors, focal weakness and headaches.  Endo/Heme/Allergies: Does not bruise/bleed easily.  Psychiatric/Behavioral: Negative for depression. The patient does not have insomnia.      VITAL SIGNS: Blood pressure (!) 108/40, pulse (!) 57, temperature 97.5 F (36.4 C), temperature source Oral, resp. rate 18, height 5\' 6"  (1.676 m), weight 55.4 kg (122 lb 3.2 oz), SpO2 92 %.  PHYSICAL EXAMINATION:   GENERAL:  75 y.o.-year-old patient lying in the bed with no acute distress.  EYES: Pupils equal, round, reactive to light and accommodation. No scleral icterus. Extraocular muscles intact.  HEENT: Head atraumatic, normocephalic. Oropharynx and nasopharynx clear.  NECK:  Supple, no jugular venous distention. No thyroid enlargement, no tenderness.  LUNGS: Normal breath sounds bilaterally, no wheezing, rales,rhonchi or crepitation. No use of accessory muscles of respiration.  CARDIOVASCULAR: S1, S2 normal. No murmurs, rubs, or gallops.  ABDOMEN: Soft, tender in the epigastric area but no rebound or guarding was noted, nondistended. Bowel sounds present. No organomegaly or mass.  EXTREMITIES: No pedal edema, cyanosis, or clubbing.  NEUROLOGIC: Cranial nerves II through XII are intact. Muscle strength 5/5 in all extremities. Sensation intact. Gait not checked.  PSYCHIATRIC: The patient is alert and oriented x 3.  SKIN: No obvious rash, lesion, or ulcer.   ORDERS/RESULTS REVIEWED:   CBC  Recent Labs Lab 05/18/16 1031 05/19/16 0432 05/20/16 0341  WBC 2.1* 1.5* 1.4*  HGB 9.5* 8.6* 7.1*  HCT 29.3* 26.5* 22.3*  PLT 171 139* 114*  MCV 74.7* 76.4* 74.8*  MCH 24.1* 24.8* 23.8*  MCHC 32.3 32.5 31.8*  RDW 19.3* 19.3* 19.3*  LYMPHSABS 0.2*  --  0.2*  MONOABS 0.2  --  0.3  EOSABS 0.0  --  0.0  BASOSABS 0.0  --  0.0   ------------------------------------------------------------------------------------------------------------------  Chemistries   Recent Labs Lab 05/14/16 1125 05/18/16 1031 05/19/16 0432  NA 139 143 141  K 5.0 3.8 4.8  CL 106 102 112*  CO2  28 32 25  GLUCOSE 127* 142* 127*  BUN 51* 27* 24*  CREATININE 1.06 1.20 0.92  CALCIUM 11.6* 10.0 8.9  AST   --  22  --   ALT  --  15*  --   ALKPHOS  --  125  --   BILITOT  --  0.9  --    ------------------------------------------------------------------------------------------------------------------ estimated creatinine clearance is 53.5 mL/min (by C-G formula based on SCr of 0.92 mg/dL). ------------------------------------------------------------------------------------------------------------------ No results for input(s): TSH, T4TOTAL, T3FREE, THYROIDAB in the last 72 hours.  Invalid input(s): FREET3  Cardiac Enzymes  Recent Labs Lab 05/18/16 1031 05/18/16 1711  TROPONINI 0.06* 0.05*   ------------------------------------------------------------------------------------------------------------------ Invalid input(s): POCBNP ---------------------------------------------------------------------------------------------------------------  RADIOLOGY: No results found.  EKG:  Orders placed or performed during the hospital encounter of 05/18/16  . ED EKG  . ED EKG  . ED EKG 12-Lead  . ED EKG 12-Lead    ASSESSMENT AND PLAN:  Active Problems:   Neutropenic fever (HCC)   Non-intractable vomiting with nausea   Diarrhea #1. Neutropenic fever, continue Cefepime, discontinue vancomycin , blood cultures are negative so far, urine culture no growth, patient is afebrile, oncology consultation is appreciated, possible change to levofloxacin prior to discharge home #2. Elevated troponin, likely demand ischemia, no further interventions, echocardiogram was done earlier this month, unremarkable #3. Neutropenia, oncology input is appreciated, now on Neupogen/Granix , follow in the morning #4 thrombocytopenia, follow in the morning, likely infection related #5 hypotension, discontinue metoprolol , initiate IV fluids #6. Anemia, iron studies revealed iron deficiency and anemia of chronic disease, risks as well as benefits of transfusion was discussed with patient and his wife, they voiced  understanding. All questions were answered. We will be transfusing patient with one unit of packed blood cells, follow hemoglobin level after transfusion, get Hemoccult, follow hemoglobin level in the morning    Management plans discussed with the patient, family and they are in agreement.   DRUG ALLERGIES: No Known Allergies  CODE STATUS:     Code Status Orders        Start     Ordered   05/18/16 1546  Full code  Continuous     05/18/16 1545    Code Status History    Date Active Date Inactive Code Status Order ID Comments User Context   05/06/2016  4:23 PM 05/13/2016 11:03 PM Full Code [id number]  Dustin Flock, MD Inpatient   04/29/2016  1:24 AM 04/30/2016  9:35 PM Full Code [id number]  Lance Coon, MD Inpatient    Advance Directive Documentation   Flowsheet Row Most Recent Value  Type of Advance Directive  Living will  Pre-existing out of facility DNR order (yellow form or pink MOST form)  No data  "MOST" Form in Place?  No data      TOTAL TIME TAKING CARE OF THIS PATIENT: 40 minutes.  Risks as well as benefits of packed red blood cell transfusion was discussed with patient and his family, all questions were answered, voiced understanding  Ayerim Berquist M.D on 05/20/2016 at 5:15 PM  Between 7am to 6pm - Pager - [phone]  After 6pm go to [email or web address] - password [email or web address] [facility]  Office  [phone]  CC: Primary care physician; Enid Derry, MD

## 2016-05-21 ENCOUNTER — Observation Stay: Payer: Commercial Managed Care - HMO

## 2016-05-21 DIAGNOSIS — C8330 Diffuse large B-cell lymphoma, unspecified site: Secondary | ICD-10-CM | POA: Diagnosis not present

## 2016-05-21 DIAGNOSIS — D47Z1 Post-transplant lymphoproliferative disorder (PTLD): Secondary | ICD-10-CM | POA: Diagnosis not present

## 2016-05-21 DIAGNOSIS — Z7982 Long term (current) use of aspirin: Secondary | ICD-10-CM

## 2016-05-21 DIAGNOSIS — T8690 Unspecified complication of unspecified transplanted organ and tissue: Secondary | ICD-10-CM

## 2016-05-21 DIAGNOSIS — D709 Neutropenia, unspecified: Secondary | ICD-10-CM | POA: Diagnosis not present

## 2016-05-21 DIAGNOSIS — D638 Anemia in other chronic diseases classified elsewhere: Secondary | ICD-10-CM

## 2016-05-21 DIAGNOSIS — E883 Tumor lysis syndrome: Secondary | ICD-10-CM

## 2016-05-21 DIAGNOSIS — R140 Abdominal distension (gaseous): Secondary | ICD-10-CM

## 2016-05-21 LAB — BASIC METABOLIC PANEL
Anion gap: 3 — ABNORMAL LOW (ref 5–15)
BUN: 21 mg/dL — AB (ref 6–20)
CHLORIDE: 114 mmol/L — AB (ref 101–111)
CO2: 21 mmol/L — AB (ref 22–32)
CREATININE: 1.35 mg/dL — AB (ref 0.61–1.24)
Calcium: 8.7 mg/dL — ABNORMAL LOW (ref 8.9–10.3)
GFR calc Af Amer: 57 mL/min — ABNORMAL LOW (ref >60)
GFR calc non Af Amer: 49 mL/min — ABNORMAL LOW (ref >60)
GLUCOSE: 86 mg/dL (ref 65–99)
POTASSIUM: 4.2 mmol/L (ref 3.5–5.1)
Sodium: 138 mmol/L (ref 135–145)

## 2016-05-21 LAB — CBC WITH DIFFERENTIAL/PLATELET
Band Neutrophils: 9 %
Basophils Absolute: 0 10*3/uL (ref 0–0.1)
Basophils Relative: 0 %
Blasts: 0 %
Eosinophils Absolute: 0.1 10*3/uL (ref 0–0.7)
Eosinophils Relative: 3 %
HCT: 25.7 % — ABNORMAL LOW (ref 40.0–52.0)
Hemoglobin: 8.3 g/dL — ABNORMAL LOW (ref 13.0–18.0)
Lymphocytes Relative: 12 %
Lymphs Abs: 0.3 10*3/uL — ABNORMAL LOW (ref 1.0–3.6)
MCH: 25.4 pg — ABNORMAL LOW (ref 26.0–34.0)
MCHC: 32.5 g/dL (ref 32.0–36.0)
MCV: 78.1 fL — ABNORMAL LOW (ref 80.0–100.0)
Metamyelocytes Relative: 3 %
Monocytes Absolute: 0.3 10*3/uL (ref 0.2–1.0)
Monocytes Relative: 12 %
Myelocytes: 0 %
Neutro Abs: 1.4 10*3/uL (ref 1.4–6.5)
Neutrophils Relative %: 61 %
Other: 0 %
Platelets: 106 10*3/uL — ABNORMAL LOW (ref 150–440)
Promyelocytes Absolute: 0 %
RBC: 3.29 MIL/uL — ABNORMAL LOW (ref 4.40–5.90)
RDW: 19.9 % — ABNORMAL HIGH (ref 11.5–14.5)
WBC: 2.1 10*3/uL — ABNORMAL LOW (ref 3.8–10.6)
nRBC: 0 /100 WBC

## 2016-05-21 LAB — TYPE AND SCREEN
ABO/RH(D): O POS
Antibody Screen: NEGATIVE
Unit division: 0

## 2016-05-21 LAB — PATHOLOGIST SMEAR REVIEW

## 2016-05-21 MED ORDER — SODIUM CHLORIDE 0.9 % IV SOLN
INTRAVENOUS | Status: DC
  Administered 2016-05-21: 19:00:00 via INTRAVENOUS

## 2016-05-21 MED ORDER — SIMETHICONE 80 MG PO CHEW
80.0000 mg | CHEWABLE_TABLET | Freq: Four times a day (QID) | ORAL | Status: DC | PRN
  Administered 2016-05-21: 05:00:00 80 mg via ORAL
  Filled 2016-05-21: qty 1

## 2016-05-21 MED ORDER — ENSURE ENLIVE PO LIQD
237.0000 mL | Freq: Two times a day (BID) | ORAL | Status: DC
  Administered 2016-05-21 – 2016-05-22 (×2): 237 mL via ORAL

## 2016-05-21 MED ORDER — FUROSEMIDE 40 MG PO TABS: 40.0000 mg | ORAL_TABLET | Freq: Two times a day (BID) | ORAL | Status: DC

## 2016-05-21 MED ORDER — FUROSEMIDE 40 MG PO TABS
80.0000 mg | ORAL_TABLET | Freq: Every day | ORAL | Status: DC
  Administered 2016-05-21: 80 mg via ORAL
  Filled 2016-05-21: qty 2

## 2016-05-21 NOTE — Progress Notes (Signed)
[facility] Hematology/Oncology Progress Note  Date of admission: 05/18/2016  Hospital day:  05/21/2016   Chief Complaint: Matthew Brown is a 75 y.o. male with post transplant lymphoproliferative disorder (PTLD), stage IVB diffuse large B cell lymphoma who was admitted with fever and neutropenia.  Subjective:  Feels pretty good.  Notes a little bit of abdominal fullness.  Voided small amount last night.  Would like diet advanced.  No diarrhea.  No fever.  No bruising or bleeding.  Social History: The patient is accompanied by his wife today.  Allergies: No Known Allergies  Scheduled Medications: . allopurinol  100 mg Oral Daily  . aspirin  325 mg Oral Daily  . ceFEPIme  2 g Intravenous Q12H  . feeding supplement  1 Container Oral TID BM  . finasteride  5 mg Oral Daily  . heparin  5,000 Units Subcutaneous Q8H  . magic mouthwash  10 mL Oral TID AC & HS  . multivitamin with minerals  1 tablet Oral Daily  . oxybutynin  5 mg Oral Daily  . pantoprazole  40 mg Oral BID  . predniSONE  5 mg Oral Daily  . sucralfate  1 g Oral QID  . tacrolimus  3 mg Oral BID  . tamsulosin  0.4 mg Oral Daily  . Tbo-Filgrastim  300 mcg Subcutaneous ONCE-1800    Review of Systems: GENERAL:  Feels good. No fevers or sweats.  Weight loss. PERFORMANCE STATUS (ECOG):  1 Lungs: No shortness of breath or cough. No hemoptysis. Cardiac: No chest pain, palpitations, orthopnea, or PND. GI:  Feeling of fullness/tight across abdomen.  No nausea, vomiting, diarrhea, constipation, melena or hematochezia. GU: Enlarged prostate. Voided small amount last night.  No urgency, frequency, dysuria, or hematuria. Musculoskeletal: Back pain and left hip pain. No joint pain. No muscle tenderness. Extremities: No pain or swelling. Skin: No rashes or skin changes. Neuro: No numbness or weakness, balance or coordination issues. Endocrine: No diabetes, thyroid issues, hot flashes or night  sweats. Psych: No mood changes, depression or anxiety. Pain: No pain. Review of systems: All other systems reviewed and found to be negative.  Physical Exam: Blood pressure 114/65, pulse 60, temperature 98.2 F (36.8 C), temperature source Oral, resp. rate 18, height 5' 6"  (1.676 m), weight 122 lb 3.2 oz (55.4 kg), SpO2 96 %.  GENERAL:Thin elderly gentleman lying comfortably on the medical unit in no acute distress. MENTAL STATUS: Alert and oriented to person, place and time. HEAD:Gray goatee. Normocephalic, atraumatic, face symmetric, no Cushingoid features. EYES:Glasses. Brown eyes. Pupils equal round and reactive to light and accomodation. No conjunctivitis or scleral icterus. CXK:GYJEHUDJSH clear without lesion. Edentulous. Tonguenormal. Mucous membranes dry. RESPIRATORY:Clear to auscultationwithout rales, wheezes or rhonchi. CARDIOVASCULAR:Regular rate andrhythmwithout murmur, rub or gallop. ABDOMEN:Soft, non-tender, non-distended, with active bowel sounds, and no hepatosplenomegaly. No masses. No shifting dullness. SKIN: No rashes, ulcers or lesions. EXTREMITIES: No edema, no skin discoloration or tenderness. No palpable cords. NEUROLOGICAL: Unremarkable. PSYCH: Appropriate.   Results for orders placed or performed during the hospital encounter of 05/18/16 (from the past 48 hour(s))  MRSA PCR Screening     Status: None   Collection Time: 05/19/16  4:12 PM  Result Value Ref Range   MRSA by PCR NEGATIVE NEGATIVE    Comment:        The GeneXpert MRSA Assay (FDA approved for NASAL specimens only), is one component of a comprehensive MRSA colonization surveillance program. It is not intended to diagnose MRSA infection nor to  guide or monitor treatment for MRSA infections.   CBC with Differential     Status: Abnormal   Collection Time: 05/20/16  3:41 AM  Result Value Ref Range   WBC 1.4 (LL) 3.8 - 10.6 K/uL    Comment: CRITICAL RESULT  CALLED TO, READ BACK BY AND VERIFIED WITH: JACKIE PAGE AT 0503 ON 05/20/16 [place].    RBC 2.98 (L) 4.40 - 5.90 MIL/uL   Hemoglobin 7.1 (L) 13.0 - 18.0 g/dL   HCT 22.3 (L) 40.0 - 52.0 %   MCV 74.8 (L) 80.0 - 100.0 fL   MCH 23.8 (L) 26.0 - 34.0 pg   MCHC 31.8 (L) 32.0 - 36.0 g/dL   RDW 19.3 (H) 11.5 - 14.5 %   Platelets 114 (L) 150 - 440 K/uL   Neutrophils Relative % 68 %   Neutro Abs 0.9 (L) 1.4 - 6.5 K/uL   Lymphocytes Relative 12 %   Lymphs Abs 0.2 (L) 1.0 - 3.6 K/uL   Monocytes Relative 18 %   Monocytes Absolute 0.3 0.2 - 1.0 K/uL   Eosinophils Relative 2 %   Eosinophils Absolute 0.0 0 - 0.7 K/uL   Basophils Relative 0 %   Basophils Absolute 0.0 0 - 0.1 K/uL  Ferritin     Status: None   Collection Time: 05/20/16  9:41 AM  Result Value Ref Range   Ferritin 265 24 - 336 ng/mL  Iron and TIBC     Status: Abnormal   Collection Time: 05/20/16  9:41 AM  Result Value Ref Range   Iron 28 (L) 45 - 182 ug/dL   TIBC 192 (L) 250 - 450 ug/dL   Saturation Ratios 15 (L) 17.9 - 39.5 %   UIBC 164 ug/dL  Reticulocytes     Status: Abnormal   Collection Time: 05/20/16  9:41 AM  Result Value Ref Range   Retic Ct Pct 4.6 (H) 0.4 - 3.1 %   RBC. 2.95 (L) 4.40 - 5.90 MIL/uL   Retic Count, Manual 135.7 19.0 - 183.0 K/uL  Type and screen [place]     Status: None   Collection Time: 05/20/16  9:41 AM  Result Value Ref Range   ABO/RH(D) O POS    Antibody Screen NEG    Sample Expiration 05/23/2016    Unit Number [id number]    Blood Component Type RED CELLS,LR    Unit division 00    Status of Unit ISSUED,FINAL    Transfusion Status OK TO TRANSFUSE    Crossmatch Result Compatible   Prepare RBC     Status: None   Collection Time: 05/20/16  9:41 AM  Result Value Ref Range   Order Confirmation ORDER PROCESSED BY BLOOD BANK   Hemoglobin and hematocrit, blood     Status: Abnormal   Collection Time: 05/20/16  7:00 PM  Result Value Ref Range   Hemoglobin 8.2 (L) 13.0 - 18.0  g/dL   HCT 25.8 (L) 40.0 - 52.0 %  CBC with Differential     Status: Abnormal   Collection Time: 05/21/16  4:47 AM  Result Value Ref Range   WBC 2.1 (L) 3.8 - 10.6 K/uL   RBC 3.29 (L) 4.40 - 5.90 MIL/uL   Hemoglobin 8.3 (L) 13.0 - 18.0 g/dL   HCT 25.7 (L) 40.0 - 52.0 %   MCV 78.1 (L) 80.0 - 100.0 fL   MCH 25.4 (L) 26.0 - 34.0 pg   MCHC 32.5 32.0 - 36.0 g/dL   RDW 19.9 (H)  11.5 - 14.5 %   Platelets 106 (L) 150 - 440 K/uL   Neutrophils Relative % 61 %   Lymphocytes Relative 12 %   Monocytes Relative 12 %   Eosinophils Relative 3 %   Basophils Relative 0 %   Band Neutrophils 9 %   Metamyelocytes Relative 3 %   Myelocytes 0 %   Promyelocytes Absolute 0 %   Blasts 0 %   nRBC 0 0 /100 WBC   Other 0 %   Neutro Abs 1.4 1.4 - 6.5 K/uL   Lymphs Abs 0.3 (L) 1.0 - 3.6 K/uL   Monocytes Absolute 0.3 0.2 - 1.0 K/uL   Eosinophils Absolute 0.1 0 - 0.7 K/uL   Basophils Absolute 0.0 0 - 0.1 K/uL   RBC Morphology POLYCHROMASIA PRESENT     Comment: SCHISTOCYTES NOTED ON SMEAR BURR CELLS   Pathologist smear review     Status: None   Collection Time: 05/21/16  4:47 AM  Result Value Ref Range   Path Review      Blood smear and EMR reviewed. Platelets are decreased without clumping. There are numerous schistocytes, and one nucleated RBC is noted. Absolute neutrophil count is 1400, and absolute lymph count is 300. No abnormal or immature leukocytes are seen. The  RBC changes strongly suggest microangiopathic hemolysis.      Comment: Reviewed by Lemmie Evens. Dicie Beam, MD.  Basic metabolic panel     Status: Abnormal   Collection Time: 05/21/16  4:47 AM  Result Value Ref Range   Sodium 138 135 - 145 mmol/L   Potassium 4.2 3.5 - 5.1 mmol/L   Chloride 114 (H) 101 - 111 mmol/L   CO2 21 (L) 22 - 32 mmol/L   Glucose, Bld 86 65 - 99 mg/dL   BUN 21 (H) 6 - 20 mg/dL   Creatinine, Ser 1.35 (H) 0.61 - 1.24 mg/dL   Calcium 8.7 (L) 8.9 - 10.3 mg/dL   GFR calc non Af Amer 49 (L) >60 mL/min   GFR calc Af Amer 57  (L) >60 mL/min    Comment: (NOTE) The eGFR has been calculated using the CKD EPI equation. This calculation has not been validated in all clinical situations. eGFR's persistently <60 mL/min signify possible Chronic Kidney Disease.    Anion gap 3 (L) 5 - 15   No results found.  Assessment:  Matthew Brown is a 75 y.o. male with stage IVBE diffuse large B cell lymphoma. He has a post-transplant lymphoproliferative disorder (PTLD).  He is day 13 s/p cycle #1 mini-RCHOP.  He was admitted with fever and neutropenia.  Blood and urine cultures are negative.  CXR was negative.  Diarrhea has resolved.  He is s/p renal transplant.  He has tumor lysis syndrome improved with hydration and allopurinol.  Plan:   1. Oncology:  Patient is day 13 s/p cycle #1 mini-RCHOP.  He is day 3 of Granix for neutropenia.  Counts are improving.    2.  Hematology:   Bone marrow revealed evidence of lymphoma.  Patient will require IT therapy prior to next cycle of chemotherapy.  Patient on Granix for neutropenia.  Counts recovering.  Patient received 1 unit of PRBCs yesterday.  Blood bank called today.  All blood products leukopoor and irradiated.  Anemia work-up on 04/15/2016 revealed an iron saturation of 6% and TIBC 241 c/w chronic disease.  Ferritin was 343.  B12 and folate were normal.  Reticulocyte count was 2% (low for level of anemia).  Daily  CBC with diff.  Discussed peripheral smear with pathology.  Smear before and after transfusion revealed numerous schistocytes (? microangiopathic process).  Hematocrit appears stable after transfusion.  Platelet count and renal function also stable.  No fever or altered mental status.  No current evidence of TTP.  Will check coag (PT, PTT, fibrinogen) to rule out DIC.  Will discuss with nephrology, Dr Juleen China, who saw him during his last admission, regarding his transplanted kidney.  Preliminary discussions with [facility] physicians.  3.  Infectious Disease:  Neutropenia  improving on Granix.  Cultures negatives.  Influenza by PCR negative.  Empiric vancomycin and Cefepime.  4.  Nephrology:  Patient is s/p renal transplant on Prograf and prednisone.  MMF was discontinued.  Prograf level therapeutic on 05/08/2016.  Zometa given on 05/13/2016 for hypercalcemia.  Creatinine 1.35.  5.  Code status:  Discussed with patient and his wife.  Full Code.    Lequita Asal, MD  05/21/2016, 9:17 AM

## 2016-05-21 NOTE — Plan of Care (Signed)
Problem: Nutrition: Goal: Adequate nutrition will be maintained Outcome: Progressing Pt now receiving ensure between meals

## 2016-05-21 NOTE — Progress Notes (Signed)
[place] [facility] [employer] NAME: Matthew Brown    MR#:  [id number]  DATE OF BIRTH:  December 18, 1940  SUBJECTIVE:  CHIEF COMPLAINT:   Chief Complaint  Patient presents with  . Fever  The patient is 75 year old African-American male with past medical history significant for history of lymphoma, status post chemotherapy about 10 days ago, and stage renal disease, status post renal transplant, persistent atrial fibrillation, pulmonary hypertension, essential hypertension, who presents to the hospital with complaints of fever to 102 at home, diarrhea, nausea, vomiting. On arrival to the hospital. He was found to be neutropenic and was initiated on cefepime and vancomycin and admitted. His chest x-ray was unremarkable as well as his urinalysis. Blood cultures are negative so far. MRSA PCR was negative. Urine culture revealed no growth. Patient remains neutropenic. He was found to be anemic.  Transfused 1 unit of packed red blood cells after which hemoglobin level has improved to 8.3 today. He feels good today, requests soft diet. Patient complained of lower abdominal discomfort earlier today, better scan was unremarkable, was able to void around 150 cc. Kidney ultrasound was ordered to rule out urinary retention, pending. An  attempt to catheterize bladder was unsuccessful   Review of Systems  Constitutional: Negative for chills, fever and weight loss.  HENT: Negative for congestion.   Eyes: Negative for blurred vision and double vision.  Respiratory: Negative for cough, sputum production, shortness of breath and wheezing.   Cardiovascular: Negative for chest pain, palpitations, orthopnea, leg swelling and PND.  Gastrointestinal: Positive for abdominal pain, diarrhea and nausea. Negative for blood in stool, constipation and vomiting.  Genitourinary: Negative for dysuria, frequency, hematuria and urgency.  Musculoskeletal: Negative for falls.  Neurological:  Negative for dizziness, tremors, focal weakness and headaches.  Endo/Heme/Allergies: Does not bruise/bleed easily.  Psychiatric/Behavioral: Negative for depression. The patient does not have insomnia.     VITAL SIGNS: Blood pressure (!) 128/53, pulse (!) 56, temperature 98.2 F (36.8 C), temperature source Oral, resp. rate 18, height 5\' 6"  (1.676 m), weight 55.4 kg (122 lb 3.2 oz), SpO2 96 %.  PHYSICAL EXAMINATION:   GENERAL:  75 y.o.-year-old patient lying in the bed with no acute distress. Comfortable, more animated, smiling EYES: Pupils equal, round, reactive to light and accommodation. No scleral icterus. Extraocular muscles intact.  HEENT: Head atraumatic, normocephalic. Oropharynx and nasopharynx clear.  NECK:  Supple, no jugular venous distention. No thyroid enlargement, no tenderness.  LUNGS: Normal breath sounds bilaterally, no wheezing, rales,rhonchi or crepitation. No use of accessory muscles of respiration.  CARDIOVASCULAR: S1, S2 normal. No murmurs, rubs, or gallops.  ABDOMEN: Soft, not painful, nondistended. Bowel sounds present. No organomegaly or mass.  EXTREMITIES: No pedal edema, cyanosis, or clubbing.  NEUROLOGIC: Cranial nerves II through XII are intact. Muscle strength 5/5 in all extremities. Sensation intact. Gait not checked.  PSYCHIATRIC: The patient is alert and oriented x 3.  SKIN: No obvious rash, lesion, or ulcer.   ORDERS/RESULTS REVIEWED:   CBC  Recent Labs Lab 05/18/16 1031 05/19/16 0432 05/20/16 0341 05/20/16 1900 05/21/16 0447  WBC 2.1* 1.5* 1.4*  --  2.1*  HGB 9.5* 8.6* 7.1* 8.2* 8.3*  HCT 29.3* 26.5* 22.3* 25.8* 25.7*  PLT 171 139* 114*  --  106*  MCV 74.7* 76.4* 74.8*  --  78.1*  MCH 24.1* 24.8* 23.8*  --  25.4*  MCHC 32.3 32.5 31.8*  --  32.5  RDW 19.3* 19.3* 19.3*  --  19.9*  LYMPHSABS 0.2*  --  0.2*  --  0.3*  MONOABS 0.2  --  0.3  --  0.3  EOSABS 0.0  --  0.0  --  0.1  BASOSABS 0.0  --  0.0  --  0.0    ------------------------------------------------------------------------------------------------------------------  Chemistries   Recent Labs Lab 05/18/16 1031 05/19/16 0432 05/21/16 0447  NA 143 141 138  K 3.8 4.8 4.2  CL 102 112* 114*  CO2 32 25 21*  GLUCOSE 142* 127* 86  BUN 27* 24* 21*  CREATININE 1.20 0.92 1.35*  CALCIUM 10.0 8.9 8.7*  AST 22  --   --   ALT 15*  --   --   ALKPHOS 125  --   --   BILITOT 0.9  --   --    ------------------------------------------------------------------------------------------------------------------ estimated creatinine clearance is 36.5 mL/min (by C-G formula based on SCr of 1.35 mg/dL (H)). ------------------------------------------------------------------------------------------------------------------ No results for input(s): TSH, T4TOTAL, T3FREE, THYROIDAB in the last 72 hours.  Invalid input(s): FREET3  Cardiac Enzymes  Recent Labs Lab 05/18/16 1031 05/18/16 1711  TROPONINI 0.06* 0.05*   ------------------------------------------------------------------------------------------------------------------ Invalid input(s): POCBNP ---------------------------------------------------------------------------------------------------------------  RADIOLOGY: Dg Chest 2 View  Result Date: 05/21/2016 CLINICAL DATA:  Shortness of breath, hypertension, history of lymphoma and atrial fibrillation EXAM: CHEST  2 VIEW COMPARISON:  Chest x-ray of 05/18/2016 FINDINGS: The lungs are not well aerated and there is chronic elevation of the right hemidiaphragm. Cardiomegaly is stable and a central venous line remains with the tip seen to the right atrium. There is indistinctness of perihilar vasculature most consistent with pulmonary vascular congestion and possible early edema. Small left effusion cannot be excluded. IMPRESSION: 1. Cardiomegaly and probable pulmonary vascular congestion. Cannot exclude developing mild CHF. 2. Poor aeration with  chronic elevation of the right hemidiaphragm. Electronically Signed   By: Ivar Drape M.D.   On: 05/21/2016 10:31    EKG:  Orders placed or performed during the hospital encounter of 05/18/16  . ED EKG  . ED EKG  . ED EKG 12-Lead  . ED EKG 12-Lead    ASSESSMENT AND PLAN:  Active Problems:   Neutropenic fever (HCC)   Non-intractable vomiting with nausea   Diarrhea #1. Neutropenic fever, continue Cefepime , blood cultures are negative so far, urine culture no growth, patient is afebrile, oncology consultation is appreciated,  change to levofloxacin prior to discharge home, hopefully tomorrow.  #2. Elevated troponin, likely demand ischemia, no further interventions, echocardiogram was done earlier this month, unremarkable #3. Neutropenia, oncology input is appreciated, now on Neupogen/Granix , improving #4 thrombocytopenia, follow in the morning, likely infection related, relatively stable #5 hypotension, resolved, now off metoprolol , discontinue IV fluids as patient is able to eat better.  #6. Anemia, iron studies revealed iron deficiency and anemia of chronic disease, risks as well as benefits of transfusion was discussed with patient and his wife, they voiced understanding. All questions were answered. She was transfused 1 unit of packed red blood cells after which hemoglobin level improved to 8.3, follow hemoglobin level in the morning, Hemoccult is pending, no bleeding #7. Nausea and vomiting, could be related to inflammation/erosions caused by chronic use of prednisone, continue Protonix, hold Carafate when patient will be receiving levofloxacin at home due to interaction. Symptoms resolved, gastroenterologist did not feel this patient needs to undergo any further procedures at this time.  #8 status post renal transplant, discontinue mycophenolate until further recommendations by transplant nephrologist/oncologist, continue prednisone, Prograf/tacrolimus  Management plans discussed with  the  patient, family and they are in agreement.   DRUG ALLERGIES: No Known Allergies  CODE STATUS:     Code Status Orders        Start     Ordered   05/18/16 1546  Full code  Continuous     05/18/16 1545    Code Status History    Date Active Date Inactive Code Status Order ID Comments User Context   05/06/2016  4:23 PM 05/13/2016 11:03 PM Full Code [id number]  Dustin Flock, MD Inpatient   04/29/2016  1:24 AM 04/30/2016  9:35 PM Full Code [id number]  Lance Coon, MD Inpatient    Advance Directive Documentation   Flowsheet Row Most Recent Value  Type of Advance Directive  Living will  Pre-existing out of facility DNR order (yellow form or pink MOST form)  No data  "MOST" Form in Place?  No data      TOTAL TIME TAKING CARE OF THIS PATIENT: 30 minutes.    Theodoro Grist M.D on 05/21/2016 at 3:50 PM  Between 7am to 6pm - Pager - [phone]  After 6pm go to [email or web address] - password [email or web address]  Office  [phone]  CC: Primary care physician; Enid Derry, MD

## 2016-05-22 DIAGNOSIS — Z7952 Long term (current) use of systemic steroids: Secondary | ICD-10-CM

## 2016-05-22 DIAGNOSIS — R197 Diarrhea, unspecified: Secondary | ICD-10-CM | POA: Diagnosis present

## 2016-05-22 DIAGNOSIS — D638 Anemia in other chronic diseases classified elsewhere: Secondary | ICD-10-CM | POA: Diagnosis present

## 2016-05-22 DIAGNOSIS — E785 Hyperlipidemia, unspecified: Secondary | ICD-10-CM | POA: Diagnosis present

## 2016-05-22 DIAGNOSIS — D709 Neutropenia, unspecified: Secondary | ICD-10-CM | POA: Diagnosis not present

## 2016-05-22 DIAGNOSIS — I959 Hypotension, unspecified: Secondary | ICD-10-CM | POA: Diagnosis present

## 2016-05-22 DIAGNOSIS — I272 Pulmonary hypertension, unspecified: Secondary | ICD-10-CM | POA: Diagnosis present

## 2016-05-22 DIAGNOSIS — N400 Enlarged prostate without lower urinary tract symptoms: Secondary | ICD-10-CM | POA: Diagnosis present

## 2016-05-22 DIAGNOSIS — Z940 Kidney transplant status: Secondary | ICD-10-CM | POA: Diagnosis not present

## 2016-05-22 DIAGNOSIS — K219 Gastro-esophageal reflux disease without esophagitis: Secondary | ICD-10-CM | POA: Diagnosis present

## 2016-05-22 DIAGNOSIS — T8619 Other complication of kidney transplant: Secondary | ICD-10-CM | POA: Diagnosis present

## 2016-05-22 DIAGNOSIS — R112 Nausea with vomiting, unspecified: Secondary | ICD-10-CM | POA: Diagnosis present

## 2016-05-22 DIAGNOSIS — E883 Tumor lysis syndrome: Secondary | ICD-10-CM | POA: Diagnosis not present

## 2016-05-22 DIAGNOSIS — Y830 Surgical operation with transplant of whole organ as the cause of abnormal reaction of the patient, or of later complication, without mention of misadventure at the time of the procedure: Secondary | ICD-10-CM | POA: Diagnosis present

## 2016-05-22 DIAGNOSIS — Z7689 Persons encountering health services in other specified circumstances: Secondary | ICD-10-CM

## 2016-05-22 DIAGNOSIS — C8330 Diffuse large B-cell lymphoma, unspecified site: Secondary | ICD-10-CM | POA: Diagnosis present

## 2016-05-22 DIAGNOSIS — D47Z1 Post-transplant lymphoproliferative disorder (PTLD): Secondary | ICD-10-CM | POA: Diagnosis present

## 2016-05-22 DIAGNOSIS — T451X5A Adverse effect of antineoplastic and immunosuppressive drugs, initial encounter: Secondary | ICD-10-CM | POA: Diagnosis present

## 2016-05-22 DIAGNOSIS — I481 Persistent atrial fibrillation: Secondary | ICD-10-CM | POA: Diagnosis present

## 2016-05-22 DIAGNOSIS — I248 Other forms of acute ischemic heart disease: Secondary | ICD-10-CM | POA: Diagnosis present

## 2016-05-22 DIAGNOSIS — E860 Dehydration: Secondary | ICD-10-CM | POA: Diagnosis present

## 2016-05-22 DIAGNOSIS — H9190 Unspecified hearing loss, unspecified ear: Secondary | ICD-10-CM | POA: Diagnosis present

## 2016-05-22 DIAGNOSIS — A419 Sepsis, unspecified organism: Secondary | ICD-10-CM | POA: Diagnosis present

## 2016-05-22 DIAGNOSIS — M109 Gout, unspecified: Secondary | ICD-10-CM | POA: Diagnosis present

## 2016-05-22 DIAGNOSIS — D6959 Other secondary thrombocytopenia: Secondary | ICD-10-CM | POA: Diagnosis present

## 2016-05-22 DIAGNOSIS — D649 Anemia, unspecified: Secondary | ICD-10-CM | POA: Diagnosis not present

## 2016-05-22 DIAGNOSIS — T380X5A Adverse effect of glucocorticoids and synthetic analogues, initial encounter: Secondary | ICD-10-CM | POA: Diagnosis present

## 2016-05-22 DIAGNOSIS — R5081 Fever presenting with conditions classified elsewhere: Secondary | ICD-10-CM | POA: Diagnosis present

## 2016-05-22 DIAGNOSIS — I129 Hypertensive chronic kidney disease with stage 1 through stage 4 chronic kidney disease, or unspecified chronic kidney disease: Secondary | ICD-10-CM | POA: Diagnosis present

## 2016-05-22 LAB — BASIC METABOLIC PANEL
Anion gap: 4 — ABNORMAL LOW (ref 5–15)
BUN: 20 mg/dL (ref 6–20)
CALCIUM: 8.9 mg/dL (ref 8.9–10.3)
CHLORIDE: 114 mmol/L — AB (ref 101–111)
CO2: 21 mmol/L — AB (ref 22–32)
CREATININE: 1.32 mg/dL — AB (ref 0.61–1.24)
GFR calc Af Amer: 59 mL/min — ABNORMAL LOW (ref >60)
GFR calc non Af Amer: 51 mL/min — ABNORMAL LOW (ref >60)
GLUCOSE: 90 mg/dL (ref 65–99)
Potassium: 3.8 mmol/L (ref 3.5–5.1)
Sodium: 139 mmol/L (ref 135–145)

## 2016-05-22 LAB — CBC WITH DIFFERENTIAL/PLATELET
Band Neutrophils: 13 %
Basophils Absolute: 0 10*3/uL (ref 0–0.1)
Basophils Relative: 0 %
Blasts: 0 %
Eosinophils Absolute: 0 10*3/uL (ref 0–0.7)
Eosinophils Relative: 0 %
HCT: 26 % — ABNORMAL LOW (ref 40.0–52.0)
Hemoglobin: 8.5 g/dL — ABNORMAL LOW (ref 13.0–18.0)
Lymphocytes Relative: 13 %
Lymphs Abs: 0.6 10*3/uL — ABNORMAL LOW (ref 1.0–3.6)
MCH: 25.2 pg — ABNORMAL LOW (ref 26.0–34.0)
MCHC: 32.9 g/dL (ref 32.0–36.0)
MCV: 76.6 fL — ABNORMAL LOW (ref 80.0–100.0)
Metamyelocytes Relative: 5 %
Monocytes Absolute: 0.2 10*3/uL (ref 0.2–1.0)
Monocytes Relative: 5 %
Myelocytes: 0 %
Neutro Abs: 4 10*3/uL (ref 1.4–6.5)
Neutrophils Relative %: 64 %
Other: 0 %
Platelets: 118 10*3/uL — ABNORMAL LOW (ref 150–440)
Promyelocytes Absolute: 0 %
RBC: 3.39 MIL/uL — ABNORMAL LOW (ref 4.40–5.90)
RDW: 20.2 % — ABNORMAL HIGH (ref 11.5–14.5)
WBC: 4.8 10*3/uL (ref 3.8–10.6)
nRBC: 0 /100 WBC

## 2016-05-22 LAB — APTT: aPTT: 38 seconds — ABNORMAL HIGH (ref 24–36)

## 2016-05-22 LAB — PROTIME-INR
INR: 1.33
Prothrombin Time: 16.6 seconds — ABNORMAL HIGH (ref 11.4–15.2)

## 2016-05-22 LAB — FIBRIN DERIVATIVES D-DIMER (ARMC ONLY): Fibrin derivatives D-dimer (ARMC): 2498 — ABNORMAL HIGH (ref 0–499)

## 2016-05-22 LAB — FIBRINOGEN: Fibrinogen: 353 mg/dL (ref 210–475)

## 2016-05-22 MED ORDER — SUCRALFATE 1 G PO TABS: 1.0000 g | ORAL_TABLET | Freq: Four times a day (QID) | ORAL | 3 refills | Status: AC

## 2016-05-22 MED ORDER — ONDANSETRON HCL 4 MG PO TABS: 4.0000 mg | ORAL_TABLET | Freq: Four times a day (QID) | ORAL | 0 refills | Status: AC | PRN

## 2016-05-22 MED ORDER — PANTOPRAZOLE SODIUM 40 MG PO TBEC: 40.0000 mg | DELAYED_RELEASE_TABLET | Freq: Two times a day (BID) | ORAL | 0 refills | Status: DC

## 2016-05-22 MED ORDER — LEVOFLOXACIN 500 MG PO TABS: 500.0000 mg | ORAL_TABLET | Freq: Every day | ORAL | 0 refills | Status: DC

## 2016-05-22 MED ORDER — SIMETHICONE 80 MG PO CHEW: 80.0000 mg | CHEWABLE_TABLET | Freq: Four times a day (QID) | ORAL | 0 refills | Status: DC | PRN

## 2016-05-22 NOTE — Progress Notes (Signed)
[facility]  ROUNDING NOTE   Subjective:  Patient well-known to [place] from prior admission. He has underlying post transplant lymphoproliferative disorder. He has stage IV B diffuse large B-cell lymphoma and has been recently started on chemotherapy. Recently he's had leukopenia, anemia, and thrombocytopenia. The patient has had fluctuating renal function recently.  However his baseline creatinine appears to bebetween 1-1.3. He previously developed tumor lysis syndrome.  Objective:  Vital signs in last 24 hours:  Temp:  [97.7 F (36.5 C)-98.2 F (36.8 C)] 98 F (36.7 C) (11/30 1414) Pulse Rate:  [59-62] 60 (11/30 1415) Resp:  [16-18] 18 (11/30 1414) BP: (111-128)/(41-55) 119/45 (11/30 1415) SpO2:  [93 %-99 %] 94 % (11/30 1414)  Weight change:  Filed Weights   05/18/16 1028 05/18/16 1539  Weight: 56.2 kg (124 lb) 55.4 kg (122 lb 3.2 oz)    Intake/Output: I/O last 3 completed shifts: In: 1827.7 [P.O.:600; I.V.:1127.7; IV Piggyback:100] Out: 512 [Urine:512]   Intake/Output this shift:  No intake/output data recorded.  Physical Exam: General: No acute distress  Head: Normocephalic, atraumatic. Moist oral mucosal membranes  Eyes: Anicteric  Neck: Supple, trachea midline  Lungs:  Clear to auscultation, normal effort  Heart: S1S2 no rubs  Abdomen:  Soft, nontender, bowel sounds present  Extremities: no peripheral edema.  Neurologic: Nonfocal, moving all four extremities  Skin: No lesions  Access: Old RUE AVF    Basic Metabolic Panel:  Recent Labs Lab 05/18/16 1031 05/19/16 0432 05/21/16 0447 05/22/16 0348  NA 143 141 138 139  K 3.8 4.8 4.2 3.8  CL 102 112* 114* 114*  CO2 32 25 21* 21*  GLUCOSE 142* 127* 86 90  BUN 27* 24* 21* 20  CREATININE 1.20 0.92 1.35* 1.32*  CALCIUM 10.0 8.9 8.7* 8.9    Liver Function Tests:  Recent Labs Lab 05/18/16 1031  AST 22  ALT 15*  ALKPHOS 125  BILITOT 0.9  PROT 6.6  ALBUMIN 3.5   No results for  input(s): LIPASE, AMYLASE in the last 168 hours. No results for input(s): AMMONIA in the last 168 hours.  CBC:  Recent Labs Lab 05/18/16 1031 05/19/16 0432 05/20/16 0341 05/20/16 1900 05/21/16 0447 05/22/16 0348  WBC 2.1* 1.5* 1.4*  --  2.1* 4.8  NEUTROABS 1.7  --  0.9*  --  1.4 4.0  HGB 9.5* 8.6* 7.1* 8.2* 8.3* 8.5*  HCT 29.3* 26.5* 22.3* 25.8* 25.7* 26.0*  MCV 74.7* 76.4* 74.8*  --  78.1* 76.6*  PLT 171 139* 114*  --  106* 118*    Cardiac Enzymes:  Recent Labs Lab 05/18/16 1031 05/18/16 1711  TROPONINI 0.06* 0.05*    BNP: Invalid input(s): POCBNP  CBG: No results for input(s): GLUCAP in the last 168 hours.  Microbiology: Results for orders placed or performed during the hospital encounter of 05/18/16  Blood Culture (routine x 2)     Status: None (Preliminary result)   Collection Time: 05/18/16 10:31 AM  Result Value Ref Range Status   Specimen Description BLOOD LEFT ANTECUBITAL  Final   Special Requests BOTTLES DRAWN AEROBIC AND ANAEROBIC 8CC  Final   Culture NO GROWTH 4 DAYS  Final   Report Status PENDING  Incomplete  Blood Culture (routine x 2)     Status: None (Preliminary result)   Collection Time: 05/18/16 10:36 AM  Result Value Ref Range Status   Specimen Description BLOOD  LEFT FOREARM  Final   Special Requests BOTTLES DRAWN AEROBIC AND ANAEROBIC  10CC  Final  Culture NO GROWTH 4 DAYS  Final   Report Status PENDING  Incomplete  Urine culture     Status: None   Collection Time: 05/18/16 12:42 PM  Result Value Ref Range Status   Specimen Description URINE, RANDOM  Final   Special Requests NONE  Final   Culture NO GROWTH Performed at [facility]   Final   Report Status 05/19/2016 FINAL  Final  MRSA PCR Screening     Status: None   Collection Time: 05/19/16  4:12 PM  Result Value Ref Range Status   MRSA by PCR NEGATIVE NEGATIVE Final    Comment:        The GeneXpert MRSA Assay (FDA approved for NASAL specimens only), is one component  of a comprehensive MRSA colonization surveillance program. It is not intended to diagnose MRSA infection nor to guide or monitor treatment for MRSA infections.     Coagulation Studies:  Recent Labs  05/22/16 0348  LABPROT 16.6*  INR 1.33    Urinalysis: No results for input(s): COLORURINE, LABSPEC, PHURINE, GLUCOSEU, HGBUR, BILIRUBINUR, KETONESUR, PROTEINUR, UROBILINOGEN, NITRITE, LEUKOCYTESUR in the last 72 hours.  Invalid input(s): APPERANCEUR    Imaging: Dg Chest 2 View  Result Date: 05/21/2016 CLINICAL DATA:  Shortness of breath, hypertension, history of lymphoma and atrial fibrillation EXAM: CHEST  2 VIEW COMPARISON:  Chest x-ray of 05/18/2016 FINDINGS: The lungs are not well aerated and there is chronic elevation of the right hemidiaphragm. Cardiomegaly is stable and a central venous line remains with the tip seen to the right atrium. There is indistinctness of perihilar vasculature most consistent with pulmonary vascular congestion and possible early edema. Small left effusion cannot be excluded. IMPRESSION: 1. Cardiomegaly and probable pulmonary vascular congestion. Cannot exclude developing mild CHF. 2. Poor aeration with chronic elevation of the right hemidiaphragm. Electronically Signed   By: Ivar Drape M.D.   On: 05/21/2016 10:31     Medications:    . allopurinol  100 mg Oral Daily  . aspirin  325 mg Oral Daily  . ceFEPIme  2 g Intravenous Q12H  . feeding supplement (ENSURE ENLIVE)  237 mL Oral BID BM  . finasteride  5 mg Oral Daily  . heparin  5,000 Units Subcutaneous Q8H  . magic mouthwash  10 mL Oral TID AC & HS  . multivitamin with minerals  1 tablet Oral Daily  . oxybutynin  5 mg Oral Daily  . pantoprazole  40 mg Oral BID  . predniSONE  5 mg Oral Daily  . sucralfate  1 g Oral QID  . tacrolimus  3 mg Oral BID  . tamsulosin  0.4 mg Oral Daily  . Tbo-Filgrastim  300 mcg Subcutaneous ONCE-1800   acetaminophen **OR** acetaminophen, albuterol,  diphenhydrAMINE, HYDROcodone-acetaminophen, hydroxypropyl methylcellulose / hypromellose, lactulose, ondansetron **OR** ondansetron (ZOFRAN) IV, phenol, polyethylene glycol, simethicone  Assessment/ Plan:  75 y.o. black male with renal transplant 2006 with baseline creatinine of 1.6-1.9 followed by [facility], Dr. Suzan Nailer on prograf, myfortic and prednisone. Patient with past medical history of pulmonary hyeprtension, atrial fibrillation, hypertension, anemia, hyperlipidemia, gout, GERD, BPH, history of GI bleed, who was admitted to [facility] on 11/14/2017for diffuse large B cell lymphoma with hyponatremia, hypercalcemia, hyperuricemia And chronic kidney disease on renal transplant.   1. S/P renal transplantation renal transplant in 2006 with baseline creatinine of 1.6-1.9.  - patient's immunosuppression was reviewed.  I've also discussed the case with Dr. Herma Ard.  Given the fact that he will need additional chemotherapy we  have decided to continue to withhold Myfortic.  However we will continue current doses of Prograf and prednisone.  He has followup with Dr. Suzan Nailer next on 05/27/16.  2. Leukopenia, anemia, thrombocytopenia, schistocytes on smear:  As above we are holding myforitc at this time.  Otherwise management per oncology.  Patient noted as having schistocytes on smear.  Would expect platelets to be lower with TTP.  Doesn't appear septic at the moment.  Lymphoma itself can lead to microangiopathic hemolytic anemia, as can some of the chemotherapy he's on.   Given stability of renal function recommened continued monitoring of peripheral smear, CBC, and renal function.    3. Recent tumor lysis syndrome:  atient will continue on allopurinol 100 mg by mouth daily.  We recommend continued monitoring of uric acid as he is at high risk for recurrence of tumor lysis syndrome.     LOS: 1 Pharrah Rottman 11/30/20173:41 PM

## 2016-05-22 NOTE — Progress Notes (Signed)
Physical Therapy Treatment Patient Details Name: Matthew Brown MRN: [id number] DOB: 08/06/1940 Today's Date: 05/22/2016    History of Present Illness Pt is a 75 y.o. male presenting to hospital with fever, lower abdominal pain, and diarrhea.  Pt admitted to hospital with neutropenic fever with sepsis and elevated troponin (likely demand ischemia per notes).  PMH includes recent diagnosis lymphoma (on chemo), hearing loss, TKA, hernia repair, and s/p renal transplant on immunotherapy.      PT Comments    Pt shows good effort with walking but does clearly have some stiffness/weakness/buckling in the R knee with stance phase.  He did not have any LOBs or overt safety issues, but did increase weight through PT's hand held assist.  Pt should be able to return home safely, encouraged him to use walker initially.   Follow Up Recommendations  Home health PT     Equipment Recommendations       Recommendations for Other Services       Precautions / Restrictions Precautions Precautions: Fall Restrictions Weight Bearing Restrictions: No    Mobility  Bed Mobility               General bed mobility comments: Pt sitting up at EOB on arrival, not tested  Transfers Overall transfer level: Independent Equipment used: None Transfers: Sit to/from Stand Sit to Stand: Supervision         General transfer comment: Pt was able to rise with only light UE use and was able to stand w/o AD for a few moments with out assist  Ambulation/Gait Ambulation/Gait assistance: Min guard Ambulation Distance (Feet): 60 Feet Assistive device: 1 person hand held assist       General Gait Details: Pt with considerable limp/low severity buckling of the R knee.  He did not have any LOBs, but lacked confidence and a few times was reaching for rail/counter top with free L hand.  Overall pt did well and is eager to go home - encouraged him to use walker at home initially    Stairs             Wheelchair Mobility    Modified Rankin (Stroke Patients Only)       Balance                                    Cognition Arousal/Alertness: Awake/alert Behavior During Therapy: WFL for tasks assessed/performed Overall Cognitive Status: Within Functional Limits for tasks assessed                      Exercises General Exercises - Lower Extremity Long Arc Quad: Strengthening;10 reps Hip ABduction/ADduction: Strengthening;10 reps Hip Flexion/Marching: Strengthening;10 reps    General Comments        Pertinent Vitals/Pain Pain Assessment: No/denies pain (mild rubbing in back of R knee, no pain )    Home Living                      Prior Function            PT Goals (current goals can now be found in the care plan section) Progress towards PT goals: Progressing toward goals    Frequency    Min 2X/week      PT Plan Current plan remains appropriate    Co-evaluation             End of  Session Equipment Utilized During Treatment: Gait belt Activity Tolerance: Patient tolerated treatment well Patient left: in bed;with nursing/sitter in room;with call bell/phone within reach     Time: 1525-1540 PT Time Calculation (min) (ACUTE ONLY): 15 min  Charges:  $Gait Training: 8-22 mins                    G Codes:      Kreg Shropshire , DPT 05/22/2016, 4:26 PM

## 2016-05-22 NOTE — Progress Notes (Signed)
I was consulted to see this patient earlier this week for nausea vomiting diarrhea. The patient has had no further symptoms.I will sign off.  Please call if any further GI concerns or questions.  We would like to thank you for the opportunity to participate in the care of Matthew Brown.

## 2016-05-22 NOTE — Progress Notes (Signed)
Nutrition Follow-up  DOCUMENTATION CODES:   Severe malnutrition in context of chronic illness  INTERVENTION:  Continue Ensure Enlive po BID, each supplement provides 350 kcal and 20 grams of protein. Provided patient and wife with coupons.   Encouraged adequate intake of protein with foods as patient is still not quite getting enough. Reviewed protein foods.   NUTRITION DIAGNOSIS:   Increased nutrient needs related to catabolic illness, cancer and cancer related treatments as evidenced by estimated needs.  Ongoing.  GOAL:   Patient will meet greater than or equal to 90% of their needs  Met for kcal, progressing for protein.  MONITOR:   PO intake, Supplement acceptance, Diet advancement, Labs, Weight trends, I & O's  REASON FOR ASSESSMENT:   Malnutrition Screening Tool    ASSESSMENT:   75 y.o. male with a known history of Lymphoma status post chemotherapy, ESRD status post the renal transplant, persistent A. fib, pulmonary hypertension and hypertension. He presents to the ED with fever 102 at home. He also complained of diarrhea but no abdominal pain, nausea or vomiting. According to his wife, the patient was constipated and got MiraLAX 3 days ago. He started to have diarrhea 2 days ago. They called his oncologist, who suggested him come to the ED due to concerns for sepsis as the patient is very complex and immunocompromise.  -As patient's diarrhea had already resolved at time of GI Consult, no interventions were necessary.  -Patient was advanced to Tolar 11/28 and then Soft Diet 11/29.   Spoke with patient and wife at bedside. Patient appears to be feeling better compared to initial assessment. He reports his appetite is good and that his throat is not as sore anymore so he is no longer having difficulty swallowing. Denies N/V, abdominal pain, or diarrhea. Patient has been drinking 1 Ensure daily. Encouraged patient to increase to 2 if he is having poor appetite and intake, but  that 1 would be sufficient if he is eating well. Provided Ensure coupons to patient and wife. Wife asked if patient had any dietary restrictions. Reinforced that the most important thing right now is adequate calories and protein to prevent further weight loss and to hopefully gain back some muscle and weight.  Meal Completion: 10-100% per chart. In the past 24 hours patient has had approximately 1964 kcal (100% estimated kcal needs) and 72 grams protein (85% minimum estimated protein needs).   Medications reviewed and include: allopurinol, Magic Mouthwash 10 ml TID before meals and bedtime, multivitamin with minerals daily, pantoprazole, prednisone 5 mg daily, tacrolimus 3 mg BID.  Labs reviewed: Chloride 114, CO2 21, Creatinine 1.32, Anion gap 4.   Diet Order:  DIET SOFT Room service appropriate? Yes; Fluid consistency: Thin  Skin:  Reviewed, no issues  Last BM:  05/20/2016  Height:   Ht Readings from Last 1 Encounters:  05/18/16 5' 6"  (1.676 m)    Weight:   Wt Readings from Last 1 Encounters:  05/18/16 122 lb 3.2 oz (55.4 kg)    Ideal Body Weight:  70 kg  BMI:  Body mass index is 19.72 kg/m.  Estimated Nutritional Needs:   Kcal:  1660-1940 (30-35 kcal/kg)  Protein:  83-95 grams (1.5-1.7 grams/kg)  Fluid:  >/= 1.6 L/day (25 ml/kg is 1.3 L/day)  EDUCATION NEEDS:   Education needs no appropriate at this time  Willey Blade, MS, RD, LDN Pager: [phone] After Hours Pager: [phone]

## 2016-05-22 NOTE — Progress Notes (Signed)
[facility] Hematology/Oncology Progress Note  Date of admission: 05/18/2016  Hospital day:  05/22/2016   Chief Complaint: Matthew Brown is a 75 y.o. male with post transplant lymphoproliferative disorder (PTLD), stage IVB diffuse large B cell lymphoma who was admitted with fever and neutropenia.  Subjective:  Feels fine.  No abdominal complaints.  Diet advancing.  Abdominal fullness resolved.  Denies any trouble voiding.  No fever.  No bruising or bleeding.  Social History: The patient is accompanied by his wife today.  Allergies: No Known Allergies  Scheduled Medications: . allopurinol  100 mg Oral Daily  . aspirin  325 mg Oral Daily  . ceFEPIme  2 g Intravenous Q12H  . feeding supplement (ENSURE ENLIVE)  237 mL Oral BID BM  . finasteride  5 mg Oral Daily  . heparin  5,000 Units Subcutaneous Q8H  . magic mouthwash  10 mL Oral TID AC & HS  . multivitamin with minerals  1 tablet Oral Daily  . oxybutynin  5 mg Oral Daily  . pantoprazole  40 mg Oral BID  . predniSONE  5 mg Oral Daily  . sucralfate  1 g Oral QID  . tacrolimus  3 mg Oral BID  . tamsulosin  0.4 mg Oral Daily  . Tbo-Filgrastim  300 mcg Subcutaneous ONCE-1800    Review of Systems: GENERAL:  Feels good. No fevers or sweats.  Weight loss. PERFORMANCE STATUS (ECOG):  1 Lungs: No shortness of breath or cough. No hemoptysis. Cardiac: No chest pain, palpitations, orthopnea, or PND. GI:  No nausea, vomiting, diarrhea, constipation, melena or hematochezia. GU: Enlarged prostate. Voided small amount last night.  No urgency, frequency, dysuria, or hematuria. Musculoskeletal:  No joint pain. No muscle tenderness. Extremities: No pain or swelling. Skin: No rashes or skin changes. Neuro: No numbness or weakness, balance or coordination issues. Endocrine: No diabetes, thyroid issues, hot flashes or night sweats. Psych: No mood changes, depression or anxiety. Pain: No pain. Review of systems:  All other systems reviewed and found to be negative.  Physical Exam: Blood pressure (!) 128/55, pulse (!) 59, temperature 98.2 F (36.8 C), temperature source Oral, resp. rate 16, height 5' 6"  (1.676 m), weight 122 lb 3.2 oz (55.4 kg), SpO2 93 %.  GENERAL:Thin elderly gentleman lying comfortably on the medical unit in no acute distress. MENTAL STATUS: Alert and oriented to person, place and time. HEAD:Gray goatee. Normocephalic, atraumatic, face symmetric, no Cushingoid features. EYES:Glasses. Brown eyes. Pupils equal round and reactive to light and accomodation. No conjunctivitis or scleral icterus. ZPH:XTAVWPVXYI clear without lesion. Edentulous. Tonguenormal. Mucous membranes dry. RESPIRATORY:Clear to auscultationwithout rales, wheezes or rhonchi. CARDIOVASCULAR:Regular rate andrhythmwithout murmur, rub or gallop. ABDOMEN:Soft, non-tender, non-distended, with active bowel sounds, and no hepatosplenomegaly. No masses.  SKIN: No rashes, ulcers or lesions. EXTREMITIES: No edema, no skin discoloration or tenderness. No palpable cords. NEUROLOGICAL: Unremarkable. PSYCH: Appropriate.   Results for orders placed or performed during the hospital encounter of 05/18/16 (from the past 48 hour(s))  Hemoglobin and hematocrit, blood     Status: Abnormal   Collection Time: 05/20/16  7:00 PM  Result Value Ref Range   Hemoglobin 8.2 (L) 13.0 - 18.0 g/dL   HCT 25.8 (L) 40.0 - 52.0 %  CBC with Differential     Status: Abnormal   Collection Time: 05/21/16  4:47 AM  Result Value Ref Range   WBC 2.1 (L) 3.8 - 10.6 K/uL   RBC 3.29 (L) 4.40 - 5.90 MIL/uL   Hemoglobin 8.3 (  L) 13.0 - 18.0 g/dL   HCT 25.7 (L) 40.0 - 52.0 %   MCV 78.1 (L) 80.0 - 100.0 fL   MCH 25.4 (L) 26.0 - 34.0 pg   MCHC 32.5 32.0 - 36.0 g/dL   RDW 19.9 (H) 11.5 - 14.5 %   Platelets 106 (L) 150 - 440 K/uL   Neutrophils Relative % 61 %   Lymphocytes Relative 12 %   Monocytes Relative 12 %    Eosinophils Relative 3 %   Basophils Relative 0 %   Band Neutrophils 9 %   Metamyelocytes Relative 3 %   Myelocytes 0 %   Promyelocytes Absolute 0 %   Blasts 0 %   nRBC 0 0 /100 WBC   Other 0 %   Neutro Abs 1.4 1.4 - 6.5 K/uL   Lymphs Abs 0.3 (L) 1.0 - 3.6 K/uL   Monocytes Absolute 0.3 0.2 - 1.0 K/uL   Eosinophils Absolute 0.1 0 - 0.7 K/uL   Basophils Absolute 0.0 0 - 0.1 K/uL   RBC Morphology POLYCHROMASIA PRESENT     Comment: SCHISTOCYTES NOTED ON SMEAR BURR CELLS   Pathologist smear review     Status: None   Collection Time: 05/21/16  4:47 AM  Result Value Ref Range   Path Review      Blood smear and EMR reviewed. Platelets are decreased without clumping. There are numerous schistocytes, and one nucleated RBC is noted. Absolute neutrophil count is 1400, and absolute lymph count is 300. No abnormal or immature leukocytes are seen. The  RBC changes strongly suggest microangiopathic hemolysis.      Comment: Reviewed by Lemmie Evens. Dicie Beam, MD.  Basic metabolic panel     Status: Abnormal   Collection Time: 05/21/16  4:47 AM  Result Value Ref Range   Sodium 138 135 - 145 mmol/L   Potassium 4.2 3.5 - 5.1 mmol/L   Chloride 114 (H) 101 - 111 mmol/L   CO2 21 (L) 22 - 32 mmol/L   Glucose, Bld 86 65 - 99 mg/dL   BUN 21 (H) 6 - 20 mg/dL   Creatinine, Ser 1.35 (H) 0.61 - 1.24 mg/dL   Calcium 8.7 (L) 8.9 - 10.3 mg/dL   GFR calc non Af Amer 49 (L) >60 mL/min   GFR calc Af Amer 57 (L) >60 mL/min    Comment: (NOTE) The eGFR has been calculated using the CKD EPI equation. This calculation has not been validated in all clinical situations. eGFR's persistently <60 mL/min signify possible Chronic Kidney Disease.    Anion gap 3 (L) 5 - 15  CBC with Differential     Status: Abnormal   Collection Time: 05/22/16  3:48 AM  Result Value Ref Range   WBC 4.8 3.8 - 10.6 K/uL   RBC 3.39 (L) 4.40 - 5.90 MIL/uL   Hemoglobin 8.5 (L) 13.0 - 18.0 g/dL   HCT 26.0 (L) 40.0 - 52.0 %   MCV 76.6 (L) 80.0 -  100.0 fL   MCH 25.2 (L) 26.0 - 34.0 pg   MCHC 32.9 32.0 - 36.0 g/dL   RDW 20.2 (H) 11.5 - 14.5 %   Platelets 118 (L) 150 - 440 K/uL   Neutrophils Relative % 64 %   Lymphocytes Relative 13 %   Monocytes Relative 5 %   Eosinophils Relative 0 %   Basophils Relative 0 %   Band Neutrophils 13 %   Metamyelocytes Relative 5 %   Myelocytes 0 %   Promyelocytes Absolute 0 %  Blasts 0 %   nRBC 0 0 /100 WBC   Other 0 %   Neutro Abs 4.0 1.4 - 6.5 K/uL   Lymphs Abs 0.6 (L) 1.0 - 3.6 K/uL   Monocytes Absolute 0.2 0.2 - 1.0 K/uL   Eosinophils Absolute 0.0 0 - 0.7 K/uL   Basophils Absolute 0.0 0 - 0.1 K/uL   RBC Morphology SCHISTOCYTES NOTED ON SMEAR     Comment: POLYCHROMASIA PRESENT BURR CELLS   Basic metabolic panel     Status: Abnormal   Collection Time: 05/22/16  3:48 AM  Result Value Ref Range   Sodium 139 135 - 145 mmol/L   Potassium 3.8 3.5 - 5.1 mmol/L   Chloride 114 (H) 101 - 111 mmol/L   CO2 21 (L) 22 - 32 mmol/L   Glucose, Bld 90 65 - 99 mg/dL   BUN 20 6 - 20 mg/dL   Creatinine, Ser 1.32 (H) 0.61 - 1.24 mg/dL   Calcium 8.9 8.9 - 10.3 mg/dL   GFR calc non Af Amer 51 (L) >60 mL/min   GFR calc Af Amer 59 (L) >60 mL/min    Comment: (NOTE) The eGFR has been calculated using the CKD EPI equation. This calculation has not been validated in all clinical situations. eGFR's persistently <60 mL/min signify possible Chronic Kidney Disease.    Anion gap 4 (L) 5 - 15  Protime-INR     Status: Abnormal   Collection Time: 05/22/16  3:48 AM  Result Value Ref Range   Prothrombin Time 16.6 (H) 11.4 - 15.2 seconds   INR 1.33   APTT     Status: Abnormal   Collection Time: 05/22/16  3:48 AM  Result Value Ref Range   aPTT 38 (H) 24 - 36 seconds    Comment:        IF BASELINE aPTT IS ELEVATED, SUGGEST PATIENT RISK ASSESSMENT BE USED TO DETERMINE APPROPRIATE ANTICOAGULANT THERAPY.   Fibrinogen     Status: None   Collection Time: 05/22/16  3:48 AM  Result Value Ref Range   Fibrinogen  353 210 - 475 mg/dL  Fibrin derivatives D-Dimer ([facility] only)     Status: Abnormal   Collection Time: 05/22/16  3:48 AM  Result Value Ref Range   Fibrin derivatives D-dimer ([facility]) 2,498 (H) 0 - 499    Comment: <> Exclusion of Venous Thromboembolism (VTE) - OUTPATIENTS ONLY        (Emergency Department or [place])             0-499 ng/ml (FEU)  : With a low to intermediate pretest                                        probability for VTE this test result                                        excludes the diagnosis of VTE.           > 499 ng/ml (FEU)  : VTE not excluded.  Additional work up                                   for VTE is required.   <>  Testing on Inpatients  and Evaluation of Disseminated Intravascular        Coagulation (DIC)             Reference Range:   0-499 ng/ml (FEU)    Dg Chest 2 View  Result Date: 05/21/2016 CLINICAL DATA:  Shortness of breath, hypertension, history of lymphoma and atrial fibrillation EXAM: CHEST  2 VIEW COMPARISON:  Chest x-ray of 05/18/2016 FINDINGS: The lungs are not well aerated and there is chronic elevation of the right hemidiaphragm. Cardiomegaly is stable and a central venous line remains with the tip seen to the right atrium. There is indistinctness of perihilar vasculature most consistent with pulmonary vascular congestion and possible early edema. Small left effusion cannot be excluded. IMPRESSION: 1. Cardiomegaly and probable pulmonary vascular congestion. Cannot exclude developing mild CHF. 2. Poor aeration with chronic elevation of the right hemidiaphragm. Electronically Signed   By: Ivar Drape M.D.   On: 05/21/2016 10:31    Assessment:  Matthew Brown is a 75 y.o. male with stage IVBE diffuse large B cell lymphoma. He has a post-transplant lymphoproliferative disorder (PTLD).  He is day 14 s/p cycle #1 mini-RCHOP.  He was admitted with fever and neutropenia.  Blood and urine cultures are negative.  CXR was negative.  Diarrhea has  resolved.  He is s/p renal transplant.  He has tumor lysis syndrome improved with hydration and allopurinol.  Plan:   1.  Oncology:  Patient is day 14 s/p cycle #1 mini-RCHOP.  He is day 4 of Granix for neutropenia.  ANC is normal.  Hematocrit and platelet count slowly improving.    2.  Hematology:   Bone marrow revealed evidence of lymphoma.  Patient will require IT therapy prior to next cycle of chemotherapy.  Discussed with patient and his wife.  Patient on Granix for neutropenia.  Counts recovering.  Patient received 1 unit of PRBCs on 05/20/2016.  All blood products leukopoor and irradiated.  Anemia work-up on 04/15/2016 revealed an iron saturation of 6% and TIBC 241 c/w chronic disease.  Ferritin was 343.  B12 and folate were normal.  Reticulocyte count was 2% (low for level of anemia).  Daily CBC with diff.  Peripheral smear before and after transfusion revealed numerous schistocytes (? microangiopathic process).  Hematocrit and platelet count stable after transfusion.  Renal function also stable.  No fever or altered mental status.  No current evidence of TTP.  Coagulation studies (PT, PTT, fibrinogen) ruled out DIC.  Fibrin split products elevated.  Discussed with nephrology regarding his transplanted kidney.  Plan to follow labs and peripheral smear without intervention as long as stable/improving.    3.  Infectious Disease:  Neutropenia resolved on Granix.  Cultures negatives.  Influenza by PCR negative. Patient on empiric vancomycin and Cefepime. Anticipate home on brief course of oral antibiotics.   4.  Nephrology:  Patient is s/p renal transplant on Prograf and prednisone.  MMF was discontinued.  Prograf level therapeutic on 05/08/2016.  Zometa given on 05/13/2016 for hypercalcemia.  Creatinine 1.32.  Appreciate nephrology consult.  5.  Code status:  Discussed with patient and his wife.  Full Code.  6.  Disposition:  Anticipate discharge today with follow-up in clinic  tomorrow.   Lequita Asal, MD  05/22/2016, 1:51 PM

## 2016-05-22 NOTE — Progress Notes (Signed)
Pharmacy Antibiotic Note  Matthew Brown is a 75 y.o. male admitted on 05/18/2016 with Sepsis/Neutropenic fever.  Pharmacy has been consulted for cefepime dosing.   Plan: Continue cefepime 2gm IV Q12H which is appropriate for indication and renal function.  Planning to discharge on levofloxacin; discussed with hospitalist the need to hold or re-time carafate when administering oral levofloxacin to avoid interaction. Must be given 2 hours before or 6 hours after administration of carafate.    Height: 5\' 6"  (167.6 cm) Weight: 122 lb 3.2 oz (55.4 kg) IBW/kg (Calculated) : 63.8  Temp (24hrs), Avg:97.9 F (36.6 C), Min:97.7 F (36.5 C), Max:98.2 F (36.8 C)   Recent Labs Lab 05/18/16 1031 05/18/16 1045 05/18/16 1405 05/19/16 0432 05/20/16 0341 05/21/16 0447 05/22/16 0348  WBC 2.1*  --   --  1.5* 1.4* 2.1* 4.8  CREATININE 1.20  --   --  0.92  --  1.35* 1.32*  LATICACIDVEN  --  1.2 0.3*  --   --   --   --     Estimated Creatinine Clearance: 37.3 mL/min (by C-G formula based on SCr of 1.32 mg/dL (H)).    No Known Allergies  Antimicrobials this admission: Vancomycin 11/26 >> 11/28 Cefepime 11/26 >>  Zosyn 11/27 >>11/27  Dose adjustments this admission:   Microbiology results: 11/26 BCx: NGTD 11/26 UCx: NGTD  Thank you for allowing pharmacy to be a part of this patient's care.  Cheri Guppy, PharmD 05/22/2016 9:53 AM

## 2016-05-22 NOTE — Progress Notes (Signed)
Spoke to Dr. Marcille Blanco concerning patient's morning labs. No new orders received. Endorsed to Soil scientist.

## 2016-05-22 NOTE — Discharge Summary (Signed)
[facility] NAME: Matthew Brown    MR#:  [id number]  DATE OF BIRTH:  May 21, 1941  DATE OF ADMISSION:  05/18/2016 ADMITTING PHYSICIAN: Demetrios Loll, MD  DATE OF DISCHARGE: 05/22/2016  PRIMARY CARE PHYSICIAN: Enid Derry, MD    ADMISSION DIAGNOSIS:  Immunocompromised patient ([place]) [D84.9] Fever, unspecified fever cause [R50.9] Kidney transplant recipient [Z94.0]  DISCHARGE DIAGNOSIS:  Active Problems:   Neutropenic fever ([place])   Non-intractable vomiting with nausea   Diarrhea   SECONDARY DIAGNOSIS:   Past Medical History:  Diagnosis Date  . Benign prostatic hypertrophy   . Chronic headache 10/19/2015  . ED (erectile dysfunction)   . End stage renal disease ([place])   . Essential hypertension   . GERD (gastroesophageal reflux disease)   . GIB (gastrointestinal bleeding)    a. [id number] s/p R colic artery embolization;  b. 02/2015 EGD: duod ulcerative mass->Bx notable for coagulative necrosis - ? ischemia vs thrombosis-->coumadin d/c'd.  . Gout   . Hearing loss   . Hemorrhoids   . Hyperlipidemia   . Lymphoma ([place])   . Osteoarthrosis, unspecified whether generalized or localized, lower leg   . Persistent atrial fibrillation ([place])    a. CHA2DS2VASc = 3-->coumadin d/c'd 02/2015 2/2 recurrent GIB.  [name] Prostatitis   . Pulmonary hypertension    a. 10/2014 Echo: EF 60-65%, mild to mod MR, mildly dil LA, nl RV, PASP 78mmHg.  [name] Renal transplant recipient   . Ulcers of both great toes [facility])     HOSPITAL COURSE:   #1. Neutropenic fever, continue Cefepime , blood cultures are negative so far, urine culture no growth, patient is afebrile, oncology consultation is appreciated,  change to levofloxacin prior to discharge home, give for 6 more days.  #2. Elevated troponin, likely demand ischemia, no further interventions, echocardiogram was done earlier this month, unremarkable #3. Neutropenia, oncology input is appreciated, now on  Neupogen/Granix , improving #4 thrombocytopenia, follow in the morning, likely infection related, relatively stable #5 hypotension, resolved, now off metoprolol , discontinue IV fluids as patient is able to eat better.  #6. Anemia, iron studies revealed iron deficiency and anemia of chronic disease, risks as well as benefits of transfusion was discussed with patient and his wife, they voiced understanding. All questions were answered. She was transfused 1 unit of packed red blood cells after which hemoglobin level improved to 8.3, no bleeding #7. Nausea and vomiting, could be related to inflammation/erosions caused by chronic use of prednisone, continue Protonix, hold Carafate when patient will be receiving levofloxacin at home due to interaction. Symptoms resolved, gastroenterologist did not feel this patient needs to undergo any further procedures at this time.  #8 status post renal transplant, discontinue mycophenolate until further recommendations by transplant nephrologist/oncologist, continue prednisone, Prograf/tacrolimus #9 recent tumour lysis syndrome   Cont allopurinol.  DISCHARGE CONDITIONS:   Stable.  CONSULTS OBTAINED:  Treatment Team:  Lloyd Huger, MD  DRUG ALLERGIES:  No Known Allergies  DISCHARGE MEDICATIONS:   Current Discharge Medication List    START taking these medications   Details  levofloxacin (LEVAQUIN) 500 MG tablet Take 1 tablet (500 mg total) by mouth daily. Qty: 6 tablet, Refills: 0    ondansetron (ZOFRAN) 4 MG tablet Take 1 tablet (4 mg total) by mouth every 6 (six) hours as needed for nausea. Qty: 20 tablet, Refills: 0    pantoprazole (PROTONIX) 40 MG tablet Take 1 tablet (40 mg total) by mouth 2 (two) times  daily. Qty: 60 tablet, Refills: 0    simethicone (MYLICON) 80 MG chewable tablet Chew 1 tablet (80 mg total) by mouth every 6 (six) hours as needed for flatulence. Qty: 30 tablet, Refills: 0      CONTINUE these medications which have  CHANGED   Details  sucralfate (CARAFATE) 1 g tablet Take 1 tablet (1 g total) by mouth 4 (four) times daily. Resume taking after one week- once finished taking oral levaquine. ( to avoid interaction.) Qty: 40 tablet, Refills: 3      CONTINUE these medications which have NOT CHANGED   Details  acetaminophen (TYLENOL) 325 MG tablet Take 2 tablets (650 mg total) by mouth every 6 (six) hours as needed for mild pain (or Fever >/= 101).    albuterol (PROAIR HFA) 108 (90 BASE) MCG/ACT inhaler Inhale 1-2 puffs into the lungs every 4 (four) hours as needed.     allopurinol (ZYLOPRIM) 100 MG tablet Take 100 mg by mouth daily.      diphenhydrAMINE (BENADRYL) 25 mg capsule Take 1 capsule (25 mg total) by mouth at bedtime as needed for sleep. Qty: 30 capsule, Refills: 0    docusate sodium (COLACE) 100 MG capsule Take 1 capsule (100 mg total) by mouth 2 (two) times daily. Qty: 10 capsule, Refills: 0    finasteride (PROSCAR) 5 MG tablet Take 5 mg by mouth daily.      HYDROcodone-acetaminophen (NORCO) 5-325 MG tablet Take 1 tablet by mouth every 4 (four) hours as needed for moderate pain. Qty: 30 tablet, Refills: 0    hydroxypropyl methylcellulose (ISOPTO TEARS) 2.5 % ophthalmic solution Place 1 drop into both eyes as needed.     lactulose (CHRONULAC) 10 GM/15ML solution Take 7.5-15 mLs (5-10 g total) by mouth daily as needed for mild constipation. Qty: 473 mL, Refills: 1    Multiple Vitamin (MULTIVITAMIN) tablet Take 1 tablet by mouth daily.      oxybutynin (DITROPAN-XL) 5 MG 24 hr tablet Take 5 mg by mouth daily.    polyethylene glycol (MIRALAX / GLYCOLAX) packet Take 17 g by mouth daily as needed (constipation). Qty: 14 each, Refills: 0    predniSONE (DELTASONE) 5 MG tablet Take 5 mg by mouth daily.     tacrolimus (PROGRAF) 1 MG capsule Take 3 mg by mouth 2 (two) times daily. Reported on 08/16/2015    Tamsulosin HCl (FLOMAX) 0.4 MG CAPS Take 0.4 mg by mouth daily.      !! feeding  supplement (BOOST / RESOURCE BREEZE) LIQD Take 1 Container by mouth 3 (three) times daily between meals. Qty: 90 Container, Refills: 0    !! feeding supplement, ENSURE ENLIVE, (ENSURE ENLIVE) LIQD Take 237 mLs by mouth 3 (three) times daily between meals. Qty: 90 Bottle, Refills: 0     !! - Potential duplicate medications found. Please discuss with provider.    STOP taking these medications     magnesium oxide (MAG-OX) 400 (241.3 Mg) MG tablet      metoprolol tartrate (LOPRESSOR) 25 MG tablet      mycophenolate (MYFORTIC) 180 MG EC tablet      omeprazole (PRILOSEC) 20 MG capsule          DISCHARGE INSTRUCTIONS:    Follow closely with Oncology clinic and renal transplant clinic.  If you experience worsening of your admission symptoms, develop shortness of breath, life threatening emergency, suicidal or homicidal thoughts you must seek medical attention immediately by calling 911 or calling your MD immediately  if symptoms less  severe.  You Must read complete instructions/literature along with all the possible adverse reactions/side effects for all the Medicines you take and that have been prescribed to you. Take any new Medicines after you have completely understood and accept all the possible adverse reactions/side effects.   Please note  You were cared for by a hospitalist during your hospital stay. If you have any questions about your discharge medications or the care you received while you were in the hospital after you are discharged, you can call the unit and asked to speak with the hospitalist on call if the hospitalist that took care of you is not available. Once you are discharged, your primary care physician will handle any further medical issues. Please note that NO REFILLS for any discharge medications will be authorized once you are discharged, as it is imperative that you return to your primary care physician (or establish a relationship with a primary care physician if  you do not have one) for your aftercare needs so that they can reassess your need for medications and monitor your lab values.    Today   CHIEF COMPLAINT:   Chief Complaint  Patient presents with  . Fever    HISTORY OF PRESENT ILLNESS:  Matthew Brown  is a 75 y.o. male with a known history of Lymphoma status post chemotherapy, ESRD status post the renal transplant, persistent A. fib, pulmonary hypertension and hypertension. He presents to the ED with fever 102 at home. He also complained of diarrhea but no abdominal pain, nausea or vomiting. According to his wife, the patient was constipated and got MiraLAX 3 days ago. He started to have diarrhea 2 days ago. They called his oncologist, who suggested him come to the ED due to concerns for sepsis as the patient isvery complex and immunocompromise. His urinalysis is normal and the chest x-ray is normal He was found neutropenic and treated with cefepime and vancomycin in ED.  VITAL SIGNS:  Blood pressure (!) 119/45, pulse 60, temperature 98 F (36.7 C), temperature source Oral, resp. rate 18, height 5\' 6"  (1.676 m), weight 55.4 kg (122 lb 3.2 oz), SpO2 94 %.  I/O:   Intake/Output Summary (Last 24 hours) at 05/22/16 1737 Last data filed at 05/22/16 0100  Gross per 24 hour  Intake           716.67 ml  Output              120 ml  Net           596.67 ml    PHYSICAL EXAMINATION:   GENERAL:  75 y.o.-year-old patient lying in the bed with no acute distress. Comfortable, more animated, smiling EYES: Pupils equal, round, reactive to light and accommodation. No scleral icterus. Extraocular muscles intact.  HEENT: Head atraumatic, normocephalic. Oropharynx and nasopharynx clear.  NECK:  Supple, no jugular venous distention. No thyroid enlargement, no tenderness.  LUNGS: Normal breath sounds bilaterally, no wheezing, rales,rhonchi or crepitation. No use of accessory muscles of respiration.  CARDIOVASCULAR: S1, S2 normal. No murmurs, rubs, or  gallops.  ABDOMEN: Soft, not painful, nondistended. Bowel sounds present. No organomegaly or mass.  EXTREMITIES: No pedal edema, cyanosis, or clubbing.  NEUROLOGIC: Cranial nerves II through XII are intact. Muscle strength 5/5 in all extremities. Sensation intact. Gait not checked.  PSYCHIATRIC: The patient is alert and oriented x 3.  SKIN: No obvious rash, lesion, or ulcer.  DATA REVIEW:   CBC  Recent Labs Lab 05/22/16 0348  WBC 4.8  HGB 8.5*  HCT 26.0*  PLT 118*    Chemistries   Recent Labs Lab 05/18/16 1031  05/22/16 0348  NA 143  < > 139  K 3.8  < > 3.8  CL 102  < > 114*  CO2 32  < > 21*  GLUCOSE 142*  < > 90  BUN 27*  < > 20  CREATININE 1.20  < > 1.32*  CALCIUM 10.0  < > 8.9  AST 22  --   --   ALT 15*  --   --   ALKPHOS 125  --   --   BILITOT 0.9  --   --   < > = values in this interval not displayed.  Cardiac Enzymes  Recent Labs Lab 05/18/16 1711  TROPONINI 0.05*    Microbiology Results  Results for orders placed or performed during the hospital encounter of 05/18/16  Blood Culture (routine x 2)     Status: None (Preliminary result)   Collection Time: 05/18/16 10:31 AM  Result Value Ref Range Status   Specimen Description BLOOD LEFT ANTECUBITAL  Final   Special Requests BOTTLES DRAWN AEROBIC AND ANAEROBIC 8CC  Final   Culture NO GROWTH 4 DAYS  Final   Report Status PENDING  Incomplete  Blood Culture (routine x 2)     Status: None (Preliminary result)   Collection Time: 05/18/16 10:36 AM  Result Value Ref Range Status   Specimen Description BLOOD  LEFT FOREARM  Final   Special Requests BOTTLES DRAWN AEROBIC AND ANAEROBIC  10CC  Final   Culture NO GROWTH 4 DAYS  Final   Report Status PENDING  Incomplete  Urine culture     Status: None   Collection Time: 05/18/16 12:42 PM  Result Value Ref Range Status   Specimen Description URINE, RANDOM  Final   Special Requests NONE  Final   Culture NO GROWTH Performed at [facility]   Final    Report Status 05/19/2016 FINAL  Final  MRSA PCR Screening     Status: None   Collection Time: 05/19/16  4:12 PM  Result Value Ref Range Status   MRSA by PCR NEGATIVE NEGATIVE Final    Comment:        The GeneXpert MRSA Assay (FDA approved for NASAL specimens only), is one component of a comprehensive MRSA colonization surveillance program. It is not intended to diagnose MRSA infection nor to guide or monitor treatment for MRSA infections.     RADIOLOGY:  Dg Chest 2 View  Result Date: 05/21/2016 CLINICAL DATA:  Shortness of breath, hypertension, history of lymphoma and atrial fibrillation EXAM: CHEST  2 VIEW COMPARISON:  Chest x-ray of 05/18/2016 FINDINGS: The lungs are not well aerated and there is chronic elevation of the right hemidiaphragm. Cardiomegaly is stable and a central venous line remains with the tip seen to the right atrium. There is indistinctness of perihilar vasculature most consistent with pulmonary vascular congestion and possible early edema. Small left effusion cannot be excluded. IMPRESSION: 1. Cardiomegaly and probable pulmonary vascular congestion. Cannot exclude developing mild CHF. 2. Poor aeration with chronic elevation of the right hemidiaphragm. Electronically Signed   By: Ivar Drape M.D.   On: 05/21/2016 10:31    EKG:   Orders placed or performed during the hospital encounter of 05/18/16  . ED EKG 12-Lead  . ED EKG 12-Lead      Management plans discussed with the patient, family and they are in agreement.  CODE STATUS:  Code Status Orders        Start     Ordered   05/18/16 1546  Full code  Continuous     05/18/16 1545    Code Status History    Date Active Date Inactive Code Status Order ID Comments User Context   05/06/2016  4:23 PM 05/13/2016 11:03 PM Full Code [id number]  Dustin Flock, MD Inpatient   04/29/2016  1:24 AM 04/30/2016  9:35 PM Full Code [id number]  Lance Coon, MD Inpatient    Advance Directive Documentation    Flowsheet Row Most Recent Value  Type of Advance Directive  Living will  Pre-existing out of facility DNR order (yellow form or pink MOST form)  No data  "MOST" Form in Place?  No data      TOTAL TIME TAKING CARE OF THIS PATIENT: 35 minutes.    Vaughan Basta M.D on 05/22/2016 at 5:37 PM  Between 7am to 6pm - Pager - [phone]  After 6pm go to [email or web address] - password [email or web address] [facility]  Office  [phone]  CC: Primary care physician; Enid Derry, MD   Note: This dictation was prepared with Dragon dictation along with smaller phrase technology. Any transcriptional errors that result from this process are unintentional.

## 2016-05-22 NOTE — Progress Notes (Signed)
Discharge paperwork including new medications reviewed with patient and patient's wife who verbalize understanding. Patient is stable and ready for discharge. Patient's wife to transport home. Case Manager Hassan Rowan to set up home health PT

## 2016-05-23 ENCOUNTER — Other Ambulatory Visit: Payer: Self-pay | Admitting: *Deleted

## 2016-05-23 ENCOUNTER — Telehealth: Payer: Self-pay | Admitting: *Deleted

## 2016-05-23 DIAGNOSIS — C8330 Diffuse large B-cell lymphoma, unspecified site: Secondary | ICD-10-CM

## 2016-05-23 LAB — CULTURE, BLOOD (ROUTINE X 2)
CULTURE: NO GROWTH
CULTURE: NO GROWTH

## 2016-05-23 NOTE — Telephone Encounter (Signed)
cALLED pt house and spoke to [name] and let her know that dr Mike Gip wants to work on getting pt back in to set him up for next treatment.  I asked if they could come in on Monday 12/4 9:30 for labs and 9:45 to see md.  The plan is to get labs to make sure his counts are not dropping to low and to talk about the plan for intrathecal chemo and then next regimen of chemo Friday of next week. [name] is agreeable and they will be here.

## 2016-05-23 NOTE — Care Management (Signed)
Discharge to home today per Dr. Anselm Jungling. Telephone call to Mr. Spilman. Discussed home health services, [facility] in the past. Would like [facility] again. Referral to Floydene Flock, [facility] representative. Shelbie Ammons RN MSN CCM Care management

## 2016-05-26 ENCOUNTER — Inpatient Hospital Stay: Payer: Commercial Managed Care - HMO | Attending: Hematology and Oncology

## 2016-05-26 ENCOUNTER — Inpatient Hospital Stay (HOSPITAL_BASED_OUTPATIENT_CLINIC_OR_DEPARTMENT_OTHER): Payer: Commercial Managed Care - HMO | Admitting: Hematology and Oncology

## 2016-05-26 ENCOUNTER — Encounter: Payer: Self-pay | Admitting: Hematology and Oncology

## 2016-05-26 ENCOUNTER — Other Ambulatory Visit: Payer: Self-pay | Admitting: Hematology and Oncology

## 2016-05-26 VITALS — BP 120/49 | HR 77 | Temp 97.1°F | Resp 18 | Ht 66.0 in | Wt 137.3 lb

## 2016-05-26 DIAGNOSIS — E210 Primary hyperparathyroidism: Secondary | ICD-10-CM

## 2016-05-26 DIAGNOSIS — D709 Neutropenia, unspecified: Secondary | ICD-10-CM | POA: Diagnosis not present

## 2016-05-26 DIAGNOSIS — Z5112 Encounter for antineoplastic immunotherapy: Secondary | ICD-10-CM | POA: Diagnosis not present

## 2016-05-26 DIAGNOSIS — C7951 Secondary malignant neoplasm of bone: Secondary | ICD-10-CM | POA: Diagnosis not present

## 2016-05-26 DIAGNOSIS — Z7689 Persons encountering health services in other specified circumstances: Secondary | ICD-10-CM | POA: Insufficient documentation

## 2016-05-26 DIAGNOSIS — N400 Enlarged prostate without lower urinary tract symptoms: Secondary | ICD-10-CM

## 2016-05-26 DIAGNOSIS — R5081 Fever presenting with conditions classified elsewhere: Secondary | ICD-10-CM

## 2016-05-26 DIAGNOSIS — H9202 Otalgia, left ear: Secondary | ICD-10-CM | POA: Insufficient documentation

## 2016-05-26 DIAGNOSIS — G4700 Insomnia, unspecified: Secondary | ICD-10-CM | POA: Insufficient documentation

## 2016-05-26 DIAGNOSIS — Z87891 Personal history of nicotine dependence: Secondary | ICD-10-CM

## 2016-05-26 DIAGNOSIS — Z940 Kidney transplant status: Secondary | ICD-10-CM | POA: Insufficient documentation

## 2016-05-26 DIAGNOSIS — I272 Pulmonary hypertension, unspecified: Secondary | ICD-10-CM | POA: Insufficient documentation

## 2016-05-26 DIAGNOSIS — R7989 Other specified abnormal findings of blood chemistry: Secondary | ICD-10-CM

## 2016-05-26 DIAGNOSIS — Z79899 Other long term (current) drug therapy: Secondary | ICD-10-CM

## 2016-05-26 DIAGNOSIS — I4891 Unspecified atrial fibrillation: Secondary | ICD-10-CM

## 2016-05-26 DIAGNOSIS — M545 Low back pain, unspecified: Secondary | ICD-10-CM

## 2016-05-26 DIAGNOSIS — I129 Hypertensive chronic kidney disease with stage 1 through stage 4 chronic kidney disease, or unspecified chronic kidney disease: Secondary | ICD-10-CM

## 2016-05-26 DIAGNOSIS — C787 Secondary malignant neoplasm of liver and intrahepatic bile duct: Secondary | ICD-10-CM

## 2016-05-26 DIAGNOSIS — M1990 Unspecified osteoarthritis, unspecified site: Secondary | ICD-10-CM | POA: Diagnosis not present

## 2016-05-26 DIAGNOSIS — N186 End stage renal disease: Secondary | ICD-10-CM | POA: Insufficient documentation

## 2016-05-26 DIAGNOSIS — C8338 Diffuse large B-cell lymphoma, lymph nodes of multiple sites: Secondary | ICD-10-CM

## 2016-05-26 DIAGNOSIS — E785 Hyperlipidemia, unspecified: Secondary | ICD-10-CM | POA: Diagnosis not present

## 2016-05-26 DIAGNOSIS — Z5111 Encounter for antineoplastic chemotherapy: Secondary | ICD-10-CM | POA: Diagnosis present

## 2016-05-26 DIAGNOSIS — Z808 Family history of malignant neoplasm of other organs or systems: Secondary | ICD-10-CM | POA: Insufficient documentation

## 2016-05-26 DIAGNOSIS — Z7952 Long term (current) use of systemic steroids: Secondary | ICD-10-CM

## 2016-05-26 DIAGNOSIS — G8929 Other chronic pain: Secondary | ICD-10-CM

## 2016-05-26 DIAGNOSIS — R413 Other amnesia: Secondary | ICD-10-CM | POA: Diagnosis not present

## 2016-05-26 DIAGNOSIS — M79671 Pain in right foot: Secondary | ICD-10-CM | POA: Diagnosis not present

## 2016-05-26 DIAGNOSIS — K219 Gastro-esophageal reflux disease without esophagitis: Secondary | ICD-10-CM

## 2016-05-26 DIAGNOSIS — M549 Dorsalgia, unspecified: Secondary | ICD-10-CM | POA: Insufficient documentation

## 2016-05-26 DIAGNOSIS — E041 Nontoxic single thyroid nodule: Secondary | ICD-10-CM | POA: Insufficient documentation

## 2016-05-26 DIAGNOSIS — N529 Male erectile dysfunction, unspecified: Secondary | ICD-10-CM | POA: Insufficient documentation

## 2016-05-26 DIAGNOSIS — M129 Arthropathy, unspecified: Secondary | ICD-10-CM | POA: Insufficient documentation

## 2016-05-26 DIAGNOSIS — Z8719 Personal history of other diseases of the digestive system: Secondary | ICD-10-CM

## 2016-05-26 DIAGNOSIS — C8330 Diffuse large B-cell lymphoma, unspecified site: Secondary | ICD-10-CM

## 2016-05-26 DIAGNOSIS — M109 Gout, unspecified: Secondary | ICD-10-CM | POA: Insufficient documentation

## 2016-05-26 LAB — CBC WITH DIFFERENTIAL/PLATELET
Basophils Absolute: 0 10*3/uL (ref 0–0.1)
Basophils Relative: 0 %
Eosinophils Absolute: 0 10*3/uL (ref 0–0.7)
Eosinophils Relative: 0 %
HCT: 26.5 % — ABNORMAL LOW (ref 40.0–52.0)
Hemoglobin: 8.8 g/dL — ABNORMAL LOW (ref 13.0–18.0)
Lymphocytes Relative: 5 %
Lymphs Abs: 0.4 10*3/uL — ABNORMAL LOW (ref 1.0–3.6)
MCH: 25.5 pg — ABNORMAL LOW (ref 26.0–34.0)
MCHC: 33.1 g/dL (ref 32.0–36.0)
MCV: 77.1 fL — ABNORMAL LOW (ref 80.0–100.0)
Monocytes Absolute: 0.7 10*3/uL (ref 0.2–1.0)
Monocytes Relative: 10 %
Neutro Abs: 6.2 10*3/uL (ref 1.4–6.5)
Neutrophils Relative %: 85 %
Platelets: 158 10*3/uL (ref 150–440)
RBC: 3.44 MIL/uL — ABNORMAL LOW (ref 4.40–5.90)
RDW: 21.6 % — ABNORMAL HIGH (ref 11.5–14.5)
WBC: 7.3 10*3/uL (ref 3.8–10.6)

## 2016-05-26 LAB — BASIC METABOLIC PANEL
Anion gap: 7 (ref 5–15)
BUN: 17 mg/dL (ref 6–20)
CO2: 25 mmol/L (ref 22–32)
Calcium: 8.9 mg/dL (ref 8.9–10.3)
Chloride: 101 mmol/L (ref 101–111)
Creatinine, Ser: 1.18 mg/dL (ref 0.61–1.24)
GFR calc Af Amer: >60 mL/min (ref >60)
GFR calc non Af Amer: 58 mL/min — ABNORMAL LOW (ref >60)
Glucose, Bld: 133 mg/dL — ABNORMAL HIGH (ref 65–99)
Potassium: 4.4 mmol/L (ref 3.5–5.1)
Sodium: 133 mmol/L — ABNORMAL LOW (ref 135–145)

## 2016-05-26 LAB — PATHOLOGIST SMEAR REVIEW

## 2016-05-26 LAB — FIBRIN DERIVATIVES D-DIMER (ARMC ONLY): Fibrin derivatives D-dimer (ARMC): 1773 — ABNORMAL HIGH (ref 0–499)

## 2016-05-26 MED ORDER — OXYCODONE-ACETAMINOPHEN 5-325 MG PO TABS: 0.5000 | ORAL_TABLET | Freq: Four times a day (QID) | ORAL | 0 refills | Status: DC | PRN

## 2016-05-26 NOTE — Progress Notes (Signed)
[facility] day:  05/26/2016   Chief Complaint: Matthew Brown is a 75 y.o. male with post-transplant lymphoproliferative disorder, stage IVBE diffuse large B cell lymphoma, who is seen for reassessment after interval hospitalization, currently day 18 s/p cycle #1 mini-RCHOP.  HPI:  The patient was last seen in the medical oncology clinic on 05/06/2016.  At that time, CT guided retroperitoneal node biopsy from 04/30/2016 was reviewed.  Pathology confirmed diffuse large B cell lymphoma.  Work-up was planned.  Labs revealed tumor lysis issues.  Decision was made to expedite evaluation and treatment.  He was admitted to the hospital.  He was admitted to [facility] from 05/06/2016 - 05/13/2016.  He had a port-a-cath placed (05/07/2016) and bone marrow aspirate and biopsy (05/08/2016).  Echo on 05/06/2016 revealed an EF of 55-60%.  Hepatitis testing negative.  Tumor lysis syndrome was well managed with fluids and allopurinol.  G6PD assay was normal.  He received Zometa on 05/13/2016 for hypercalcemia.  He received cycle #1 mini-RCHOP on 05/09/2016.  He tolerated his treatment well.  He received 5 days of prednisone 70 mg a day (last 05/14/2016).  MMF was discontinued.  Prograf level was 5.5 (3.0-8.0) on 05/08/2016.   Bone marrow aspirate and biopsy revealed multifocal marrow involvement by diffuse large B-cell lymphoma. There was variably cellular marrow for age (50% - 90%) with a patchy predominantly nodular large B-cell infiltrate, overall estimated to account for 20% of the core biopsy sample. There was adequate residual trilineage hematopoiesis with mild nonspecific dyserythropoiesis. There was patchy mild increase in reticulin. Storage iron was present. The immunohistochemical staining pattern of the B-cell infiltrate (CD10 +/-, BCL 6+, BCL-2 +) would suggest possible large cell transformation of follicular lymphoma.  Flow cytometry revealed no significant  immunophenotypic abnormalities or evidence of B-cell lymphoma.  Labs at discharge included a hematocrit of 24.7, hemoglobin 8, MCV 75, platelets 185,000, white count 4300 with an ANC of 4000.  BUN was 54, creatinine 1.21, calcium 11.2, ionized calcium 7.3 (4.5 - 5.6), and potassium 4.9 (after Kayexalate).  Labs on 05/14/2016 included a 139, potassium 5.0, BUN 51, creatinine 1.06, and calcium 11.6.  He was admitted to [facility] from 05/18/2016 - 05/22/2016 with fever and neutropenia.  Nadir counts on 05/20/2016 revealed a WBC 1400 and an ANC of 900.  Blood and urine cultures were negative.  He was treated with Cefepime.  He received GCSF x 4 days.  He received 1 unit of PRBCs for a hematocrit of 22.3 and a hemoglobin of 7.1.  Counts at discharge included a hematocrit of 26.0, hemoglobin 8.5, platelets 118,000, white count 4800 with an ANC of 4000.  Peripheral smear before and after transfusion revealed numerous schistocytes (? microangiopathic process).  Hematocrit and platelet count were stable after transfusion.  Renal function was also stable.  he had no fever or altered mental status.  There was no evidence of TTP.  Coagulation studies (PT, PTT, fibrinogen) ruled out DIC.  Fibrin split products were elevated.  Plan was to follow labs and peripheral smear without intervention as long as stable/improving.   Symptomatically, he has had back pain and subsequent poor sleep.  His back pain has been a chronic problem.  He is using a walker at home.  He denies any fevers.  Today is his last day of antibiotics.  Memory is poor.   Past Medical History:  Diagnosis Date  . Benign prostatic hypertrophy   . Chronic headache 10/19/2015  .  ED (erectile dysfunction)   . End stage renal disease ([place])   . Essential hypertension   . GERD (gastroesophageal reflux disease)   . GIB (gastrointestinal bleeding)    a. 11/2945 s/p R colic artery embolization;  b. 02/2015 EGD: duod ulcerative mass->Bx notable for coagulative  necrosis - ? ischemia vs thrombosis-->coumadin d/c'd.  . Gout   . Hearing loss   . Hemorrhoids   . Hyperlipidemia   . Lymphoma ([place])   . Lymphoma ([place]) 2017  . Osteoarthrosis, unspecified whether generalized or localized, lower leg   . Persistent atrial fibrillation ([place])    a. CHA2DS2VASc = 3-->coumadin d/c'd 02/2015 2/2 recurrent GIB.  [name] Prostatitis   . Pulmonary hypertension    a. 10/2014 Echo: EF 60-65%, mild to mod MR, mildly dil LA, nl RV, PASP 14mHg.  .Marland KitchenRenal transplant recipient   . Ulcers of both great toes ([facility]     Past Surgical History:  Procedure Laterality Date  . BACK SURGERY    . ESOPHAGOGASTRODUODENOSCOPY  03/13/15   severe esophagitis, ulcerated mass  . HERNIA REPAIR  1974  . PERIPHERAL VASCULAR CATHETERIZATION N/A 05/07/2016   Procedure: PGlori LuisCath Insertion;  Surgeon: JAlgernon Huxley MD;  [facility];  Service: Cardiovascular;  Laterality: N/A;  . PROSTATE ABLATION    . STOMACH SURGERY     blood vessel burst  . THROAT SURGERY    . TOTAL KNEE ARTHROPLASTY      Family History  Problem Relation Age of Onset  . Cancer Mother     throat  . Diabetes Brother   . Heart disease Brother   . Stroke Brother   . Hypertension Brother   . Diabetes Sister   . Heart disease Sister   . Hypertension Sister   . Diabetes Sister   . Diabetes Brother   . COPD Neg Hx   . Kidney disease Neg Hx   . Prostate cancer Neg Hx     Social History:  reports that he quit smoking about 37 years ago. His smoking use included Cigarettes. He has a 25.00 pack-year smoking history. He has never used smokeless tobacco. He reports that he does not drink alcohol or use drugs.  He stopped smoking in 1981.  He smoked 3 cigarettes/day.  [place]  The patient is accompanied by his wife, Matthew Brown  today.  Allergies: No Known Allergies  Current Medications: Current Outpatient Prescriptions  Medication Sig Dispense Refill  . acetaminophen (TYLENOL) 325 MG tablet  Take 2 tablets (650 mg total) by mouth every 6 (six) hours as needed for mild pain (or Fever >/= 101).    .Marland Kitchenalbuterol (PROAIR HFA) 108 (90 BASE) MCG/ACT inhaler Inhale 1-2 puffs into the lungs every 4 (four) hours as needed.     .Marland Kitchenallopurinol (ZYLOPRIM) 100 MG tablet Take 100 mg by mouth daily.      . diphenhydrAMINE (BENADRYL) 25 mg capsule Take 1 capsule (25 mg total) by mouth at bedtime as needed for sleep. 30 capsule 0  . docusate sodium (COLACE) 100 MG capsule Take 1 capsule (100 mg total) by mouth 2 (two) times daily. 10 capsule 0  . feeding supplement (BOOST / RESOURCE BREEZE) LIQD Take 1 Container by mouth 3 (three) times daily between meals. 90 Container 0  . feeding supplement, ENSURE ENLIVE, (ENSURE ENLIVE) LIQD Take 237 mLs by mouth 3 (three) times daily between meals. 90 Bottle 0  . finasteride (PROSCAR) 5 MG tablet Take 5 mg by  mouth daily.      [name] HYDROcodone-acetaminophen (NORCO) 5-325 MG tablet Take 1 tablet by mouth every 4 (four) hours as needed for moderate pain. 30 tablet 0  . hydroxypropyl methylcellulose (ISOPTO TEARS) 2.5 % ophthalmic solution Place 1 drop into both eyes as needed.     . lactulose (CHRONULAC) 10 GM/15ML solution Take 7.5-15 mLs (5-10 g total) by mouth daily as needed for mild constipation. 473 mL 1  . levofloxacin (LEVAQUIN) 500 MG tablet Take 1 tablet (500 mg total) by mouth daily. 6 tablet 0  . Multiple Vitamin (MULTIVITAMIN) tablet Take 1 tablet by mouth daily.      . ondansetron (ZOFRAN) 4 MG tablet Take 1 tablet (4 mg total) by mouth every 6 (six) hours as needed for nausea. 20 tablet 0  . oxybutynin (DITROPAN-XL) 5 MG 24 hr tablet Take 5 mg by mouth daily.    . pantoprazole (PROTONIX) 40 MG tablet Take 1 tablet (40 mg total) by mouth 2 (two) times daily. 60 tablet 0  . polyethylene glycol (MIRALAX / GLYCOLAX) packet Take 17 g by mouth daily as needed (constipation). 14 each 0  . predniSONE (DELTASONE) 5 MG tablet Take 5 mg by mouth daily.     .  simethicone (MYLICON) 80 MG chewable tablet Chew 1 tablet (80 mg total) by mouth every 6 (six) hours as needed for flatulence. 30 tablet 0  . sucralfate (CARAFATE) 1 g tablet Take 1 tablet (1 g total) by mouth 4 (four) times daily. Resume taking after one week- once finished taking oral levaquine. ( to avoid interaction.) 40 tablet 3  . tacrolimus (PROGRAF) 1 MG capsule Take 3 mg by mouth 2 (two) times daily. Reported on 08/16/2015    . Tamsulosin HCl (FLOMAX) 0.4 MG CAPS Take 0.4 mg by mouth daily.       No current facility-administered medications for this visit.     Review of Systems:  GENERAL:  Fatigue.  Feeling a little better.  No fevers or sweats.  Weight gain of 11 pounds. PERFORMANCE STATUS (ECOG):  1 HEENT:  No visual changes, sore throat, mouth sores or tenderness. Lungs: No shortness of breath or cough.  No hemoptysis. Cardiac:  No chest pain, palpitations, orthopnea, or PND. GI:  Poor appetite.  No nausea, vomiting, diarrhea, constipation, melena or hematochezia. GU:  Enlarged prostate.  No urgency, frequency, dysuria, or hematuria. Musculoskeletal:  Back pain (chronic).  No joint pain.  No muscle tenderness. Extremities:  No pain or swelling. Skin:  No rashes or skin changes. Neuro:  Poor memory.  No numbness or weakness, balance or coordination issues. Endocrine:  No diabetes, thyroid issues, hot flashes or night sweats. Psych:  Poor sleep.  No mood changes, depression or anxiety. Pain:  Chronic off/on back pain. Review of systems:  All other systems reviewed and found to be negative.  Physical Exam: Blood pressure (!) 120/49, pulse 77, temperature 97.1 F (36.2 C), temperature source Tympanic, resp. rate 18, height 5' 6"  (1.676 m), weight 137 lb 5.6 oz (62.3 kg). GENERAL:  Thin elderly gentleman sitting in a wheelchair in the exam room in no acute distress. MENTAL STATUS:  Alert and oriented to person, place and time. HEAD:  [place].  Normocephalic, atraumatic, face  symmetric, no Cushingoid features. EYES:  Glasses.  Brown eyes.  Pupils equal round and reactive to light and accomodation.  No conjunctivitis or scleral icterus. ENT:  Oropharynx clear without lesion.  Edentulous.  Tongue normal. Mucous membranes dry.  RESPIRATORY:  Clear to auscultation without rales, wheezes or rhonchi. CARDIOVASCULAR:  Regular rate and rhythm without murmur, rub or gallop. ABDOMEN:  Soft, non-tender, with active bowel sounds, and no hepatosplenomegaly.  No masses.   SKIN:  No rashes, ulcers or lesions. EXTREMITIES:  No edema, no skin discoloration or tenderness.  No palpable cords. NEUROLOGICAL: Unremarkable. PSYCH:  Appropriate.   Appointment on 05/26/2016  Component Date Value Ref Range Status  . WBC 05/26/2016 7.3  3.8 - 10.6 K/uL Final  . RBC 05/26/2016 3.44* 4.40 - 5.90 MIL/uL Final  . Hemoglobin 05/26/2016 8.8* 13.0 - 18.0 g/dL Final  . HCT 05/26/2016 26.5* 40.0 - 52.0 % Final  . MCV 05/26/2016 77.1* 80.0 - 100.0 fL Final  . MCH 05/26/2016 25.5* 26.0 - 34.0 pg Final  . MCHC 05/26/2016 33.1  32.0 - 36.0 g/dL Final  . RDW 05/26/2016 21.6* 11.5 - 14.5 % Final  . Platelets 05/26/2016 158  150 - 440 K/uL Final  . Neutrophils Relative % 05/26/2016 85  % Final  . Neutro Abs 05/26/2016 6.2  1.4 - 6.5 K/uL Final  . Lymphocytes Relative 05/26/2016 5  % Final  . Lymphs Abs 05/26/2016 0.4* 1.0 - 3.6 K/uL Final  . Monocytes Relative 05/26/2016 10  % Final  . Monocytes Absolute 05/26/2016 0.7  0.2 - 1.0 K/uL Final  . Eosinophils Relative 05/26/2016 0  % Final  . Eosinophils Absolute 05/26/2016 0.0  0 - 0.7 K/uL Final  . Basophils Relative 05/26/2016 0  % Final  . Basophils Absolute 05/26/2016 0.0  0 - 0.1 K/uL Final  . Sodium 05/26/2016 133* 135 - 145 mmol/L Final  . Potassium 05/26/2016 4.4  3.5 - 5.1 mmol/L Final  . Chloride 05/26/2016 101  101 - 111 mmol/L Final  . CO2 05/26/2016 25  22 - 32 mmol/L Final  . Glucose, Bld 05/26/2016 133* 65 - 99 mg/dL Final  . BUN  05/26/2016 17  6 - 20 mg/dL Final  . Creatinine, Ser 05/26/2016 1.18  0.61 - 1.24 mg/dL Final  . Calcium 05/26/2016 8.9  8.9 - 10.3 mg/dL Final  . GFR calc non Af Amer 05/26/2016 58* >60 mL/min Final  . GFR calc Af Amer 05/26/2016 >60  >60 mL/min Final   Comment: (NOTE) The eGFR has been calculated using the CKD EPI equation. This calculation has not been validated in all clinical situations. eGFR's persistently <60 mL/min signify possible Chronic Kidney Disease.   . Anion gap 05/26/2016 7  5 - 15 Final    Assessment:  Matthew STAGE is a 75 y.o. male s/p renal transplant (2007) with a post-transplant lymphoproliferative disorder, stage IV diffuse large B cell lymphoma.  He presented with a 2-3 month history of progressive back pain superimposed on chronic back pain.    PET scan on 04/28/2016 revealed bulky intensely hypermetabolic periaortic upper abdominal and mesenteric adenopathy concerning for high-grade lymphoma.  There was hypermetabolic liver metastasis.  There was hpermetabolic lesion involving the small bowel of the upper pelvis.  There were multiple sites of hypermetabolic skeletal metastasis. There was moderate volume right pneumothorax.  CT guided retroperitoneal node biopsy on 04/30/2016 revealed diffuse large B cell lymphoma.  Hepatitis B and C testing on 05/06/2016 were negative.  Echo on 05/06/2016 revealed an EF of 55-60%.  Bone marrow aspirate and biopsy on 05/08/2016 revealed multifocal marrow involvement by diffuse large B-cell lymphoma. There was variably cellular marrow for age (50% - 90%) with a patchy predominantly nodular large B-cell infiltrate, overall estimated to  account for 20% of the core biopsy. There was adequate residual trilineage hematopoiesis with mild nonspecific dyserythropoiesis. There was patchy mild increase in reticulin. Storage iron was present. The immunohistochemical staining pattern of the B-cell infiltrate (CD10 +/-, BCL 6+, BCL-2 +) suggested  possible large cell transformation of follicular lymphoma.  Flow cytometry revealed no significant immunophenotypic abnormalities or evidence of B-cell lymphoma.  He has bone metastasis and hypercalcemia.  Calcium was 11.2 (ionized 7.3) on 05/13/2016.  He received Zometa on 05/13/2016.  Work-up on 04/15/2016 revealed the following normal studies: ferritin (343), iron saturation (6%), TIBC (241; low), B12 (525), folate (27).  Reticulocyte count was 2%.  LDH was 409.  Uric acid was 7.7 (4.4 - 7.6).  He was admitted at [facility] from 04/28/2016 - 04/30/2016 with a moderate volume right sided pneumothorax.  His pneumothorax improved spontaneously. Plain films of the right femur revealed the lucent bone lesion in the right femoral neck  was poorly characterized.  There was no evidence for an acute fracture.  PTH was 106 (high) 04/30/2016 with a calcium of 11.2.  Etiology was c/w primary hyperparathyroidism.  PTH-related polypeptide was < 1.1 on 04/30/2016.  He has a history of renal failure s/p renal transplant.  He is tacrolimus (Prograf), and steroids  Creatinine has ranged between 1.43 - 1.93 in the past 6 months. Mycophenolate (MMF) was discontinued at diagnosis.  Prograf level was 5.5 (3.0-8.0) on 05/08/2016.   He is currently day 18 s/p cycle #1 mini-RCHOP (05/09/2016).  Cycle #1 was complicated by fever and neutropenia.  Al cultures were negative.  He was treated empirically with Cefepime.  He completes Levaquin today.  Peripheral smear revealed schistocytes.  Symptomatically, he is feeling a little better.  He denies any fevers.  Exam is stable.  Plan: 1.  Labs today:  CBC with diff, BMP, D-dimer. 2.  Peripheral smear for path review. 3.  Discuss 2 recent hospitalizations.  Discuss bone marrow aspirate and biopsy.  Discuss plan for intrathecal MTX prophylaxis given bone marrow positive for lymphoma.  If CSF +, discuss plan for additional IT MTX.  Procedure formed with interventional radiology. 4.   Discuss continued good hydration and allopurinol. 5.  LP with IT MTX on 05/29/2016. 6.  Labs prior to procedure: CBC, PT, PTT, BMP 7.  RTC on 05/30/2016 for MD assessment, labs (CBC with diff, CMP, LDH, uric acid), and cycle #2 mini-RCHOP + Neulasta.   Lequita Asal, MD  05/26/2016, 10:43 AM

## 2016-05-26 NOTE — Progress Notes (Signed)
Pt's significant other states his memory is an issue- he can't remember the way to get here, can't remember people, and phone number.  He is eating good and trying to drink good. He is drinking ensure.

## 2016-05-27 ENCOUNTER — Other Ambulatory Visit: Payer: Self-pay | Admitting: Hematology and Oncology

## 2016-05-27 DIAGNOSIS — C8338 Diffuse large B-cell lymphoma, lymph nodes of multiple sites: Secondary | ICD-10-CM

## 2016-05-28 ENCOUNTER — Inpatient Hospital Stay: Payer: Commercial Managed Care - HMO

## 2016-05-28 ENCOUNTER — Other Ambulatory Visit: Payer: Self-pay | Admitting: *Deleted

## 2016-05-28 DIAGNOSIS — C8338 Diffuse large B-cell lymphoma, lymph nodes of multiple sites: Secondary | ICD-10-CM

## 2016-05-28 DIAGNOSIS — C787 Secondary malignant neoplasm of liver and intrahepatic bile duct: Secondary | ICD-10-CM

## 2016-05-28 DIAGNOSIS — C7951 Secondary malignant neoplasm of bone: Secondary | ICD-10-CM

## 2016-05-28 DIAGNOSIS — Z5112 Encounter for antineoplastic immunotherapy: Secondary | ICD-10-CM | POA: Diagnosis not present

## 2016-05-28 LAB — BASIC METABOLIC PANEL
Anion gap: 4 — ABNORMAL LOW (ref 5–15)
BUN: 14 mg/dL (ref 6–20)
CO2: 26 mmol/L (ref 22–32)
Calcium: 9.1 mg/dL (ref 8.9–10.3)
Chloride: 103 mmol/L (ref 101–111)
Creatinine, Ser: 1.1 mg/dL (ref 0.61–1.24)
GFR calc Af Amer: >60 mL/min (ref >60)
GFR calc non Af Amer: >60 mL/min (ref >60)
Glucose, Bld: 100 mg/dL — ABNORMAL HIGH (ref 65–99)
Potassium: 4.1 mmol/L (ref 3.5–5.1)
Sodium: 133 mmol/L — ABNORMAL LOW (ref 135–145)

## 2016-05-28 LAB — CBC WITH DIFFERENTIAL/PLATELET
Basophils Absolute: 0 10*3/uL (ref 0–0.1)
Basophils Relative: 0 %
Eosinophils Absolute: 0 10*3/uL (ref 0–0.7)
Eosinophils Relative: 0 %
HCT: 26.8 % — ABNORMAL LOW (ref 40.0–52.0)
Hemoglobin: 8.7 g/dL — ABNORMAL LOW (ref 13.0–18.0)
Lymphocytes Relative: 6 %
Lymphs Abs: 0.3 10*3/uL — ABNORMAL LOW (ref 1.0–3.6)
MCH: 25.5 pg — ABNORMAL LOW (ref 26.0–34.0)
MCHC: 32.3 g/dL (ref 32.0–36.0)
MCV: 79 fL — ABNORMAL LOW (ref 80.0–100.0)
Monocytes Absolute: 0.4 10*3/uL (ref 0.2–1.0)
Monocytes Relative: 8 %
Neutro Abs: 4.7 10*3/uL (ref 1.4–6.5)
Neutrophils Relative %: 86 %
Platelets: 138 10*3/uL — ABNORMAL LOW (ref 150–440)
RBC: 3.39 MIL/uL — ABNORMAL LOW (ref 4.40–5.90)
RDW: 22.9 % — ABNORMAL HIGH (ref 11.5–14.5)
WBC: 5.5 10*3/uL (ref 3.8–10.6)

## 2016-05-28 LAB — APTT: aPTT: 37 seconds — ABNORMAL HIGH (ref 24–36)

## 2016-05-28 LAB — PROTIME-INR
INR: 1.27
Prothrombin Time: 16 seconds — ABNORMAL HIGH (ref 11.4–15.2)

## 2016-05-29 ENCOUNTER — Other Ambulatory Visit: Payer: Self-pay | Admitting: Hematology and Oncology

## 2016-05-29 ENCOUNTER — Ambulatory Visit
Admission: RE | Admit: 2016-05-29 | Discharge: 2016-05-29 | Disposition: A | Discharge status: Home / Self Care | Payer: Commercial Managed Care - HMO | Source: Ambulatory Visit | Attending: Hematology and Oncology | Admitting: Hematology and Oncology

## 2016-05-29 DIAGNOSIS — Z538 Procedure and treatment not carried out for other reasons: Secondary | ICD-10-CM | POA: Insufficient documentation

## 2016-05-29 DIAGNOSIS — C8333 Diffuse large B-cell lymphoma, intra-abdominal lymph nodes: Secondary | ICD-10-CM

## 2016-05-29 DIAGNOSIS — M47816 Spondylosis without myelopathy or radiculopathy, lumbar region: Secondary | ICD-10-CM | POA: Diagnosis not present

## 2016-05-29 DIAGNOSIS — C8339 Diffuse large B-cell lymphoma, extranodal and solid organ sites: Secondary | ICD-10-CM

## 2016-05-29 DIAGNOSIS — C8338 Diffuse large B-cell lymphoma, lymph nodes of multiple sites: Secondary | ICD-10-CM | POA: Diagnosis not present

## 2016-05-29 MED ORDER — SODIUM CHLORIDE 0.9 % IJ SOLN
Freq: Once | INTRAMUSCULAR | Status: DC
  Filled 2016-05-29: qty 0.48

## 2016-05-29 NOTE — Discharge Instructions (Signed)
Lumbar Puncture, Care After °Refer to this sheet in the next few weeks. These instructions provide you with information on caring for yourself after your procedure. Your health care provider may also give you more specific instructions. Your treatment has been planned according to current medical practices, but problems sometimes occur. Call your health care provider if you have any problems or questions after your procedure. °What can I expect after the procedure? °After your procedure, it is typical to have the following sensations: °· Mild discomfort or pain at the insertion site. °· Mild headache that is relieved with pain medicines. ° °Follow these instructions at home: ° °· Avoid lifting anything heavier than 10 lb (4.5 kg) for at least 12 hours after the procedure. °· Drink enough fluids to keep your urine clear or pale yellow. °Contact a health care provider if: °· You have fever or chills. °· You have nausea or vomiting. °· You have a headache that lasts for more than 2 days. °Get help right away if: °· You have any numbness or tingling in your legs. °· You are unable to control your bowel or bladder. °· You have bleeding or swelling in your back at the insertion site. °· You are dizzy or faint. °This information is not intended to replace advice given to you by your health care provider. Make sure you discuss any questions you have with your health care provider. °Document Released: 06/14/2013 Document Revised: 11/15/2015 Document Reviewed: 02/15/2013 °Elsevier Interactive Patient Education © 2017 [employer] ° °

## 2016-05-29 NOTE — Progress Notes (Signed)
Patient ID: Matthew Brown, male   DOB: 1940-12-24, 75 y.o.   MRN: [id number] Pt. Stable after unsuccessful l.p.--Pt's Oncologist is aware-f/u with Oncolgy.D/C instructions given.

## 2016-05-30 ENCOUNTER — Inpatient Hospital Stay: Payer: Commercial Managed Care - HMO

## 2016-05-30 ENCOUNTER — Other Ambulatory Visit: Payer: Self-pay | Admitting: Hematology and Oncology

## 2016-05-30 ENCOUNTER — Telehealth: Payer: Self-pay | Admitting: *Deleted

## 2016-05-30 ENCOUNTER — Inpatient Hospital Stay (HOSPITAL_BASED_OUTPATIENT_CLINIC_OR_DEPARTMENT_OTHER): Payer: Commercial Managed Care - HMO | Admitting: Hematology and Oncology

## 2016-05-30 VITALS — BP 125/69 | HR 65 | Temp 97.8°F | Resp 18 | Wt 138.7 lb

## 2016-05-30 DIAGNOSIS — M79671 Pain in right foot: Secondary | ICD-10-CM

## 2016-05-30 DIAGNOSIS — M109 Gout, unspecified: Secondary | ICD-10-CM

## 2016-05-30 DIAGNOSIS — M1990 Unspecified osteoarthritis, unspecified site: Secondary | ICD-10-CM

## 2016-05-30 DIAGNOSIS — Z5111 Encounter for antineoplastic chemotherapy: Secondary | ICD-10-CM

## 2016-05-30 DIAGNOSIS — I4891 Unspecified atrial fibrillation: Secondary | ICD-10-CM

## 2016-05-30 DIAGNOSIS — G4700 Insomnia, unspecified: Secondary | ICD-10-CM

## 2016-05-30 DIAGNOSIS — C8333 Diffuse large B-cell lymphoma, intra-abdominal lymph nodes: Secondary | ICD-10-CM

## 2016-05-30 DIAGNOSIS — K219 Gastro-esophageal reflux disease without esophagitis: Secondary | ICD-10-CM

## 2016-05-30 DIAGNOSIS — N400 Enlarged prostate without lower urinary tract symptoms: Secondary | ICD-10-CM

## 2016-05-30 DIAGNOSIS — Z87891 Personal history of nicotine dependence: Secondary | ICD-10-CM

## 2016-05-30 DIAGNOSIS — E210 Primary hyperparathyroidism: Secondary | ICD-10-CM

## 2016-05-30 DIAGNOSIS — Z7952 Long term (current) use of systemic steroids: Secondary | ICD-10-CM

## 2016-05-30 DIAGNOSIS — C787 Secondary malignant neoplasm of liver and intrahepatic bile duct: Secondary | ICD-10-CM

## 2016-05-30 DIAGNOSIS — E785 Hyperlipidemia, unspecified: Secondary | ICD-10-CM

## 2016-05-30 DIAGNOSIS — Z5112 Encounter for antineoplastic immunotherapy: Secondary | ICD-10-CM | POA: Diagnosis not present

## 2016-05-30 DIAGNOSIS — M549 Dorsalgia, unspecified: Secondary | ICD-10-CM

## 2016-05-30 DIAGNOSIS — C8339 Diffuse large B-cell lymphoma, extranodal and solid organ sites: Secondary | ICD-10-CM

## 2016-05-30 DIAGNOSIS — I129 Hypertensive chronic kidney disease with stage 1 through stage 4 chronic kidney disease, or unspecified chronic kidney disease: Secondary | ICD-10-CM

## 2016-05-30 DIAGNOSIS — C8338 Diffuse large B-cell lymphoma, lymph nodes of multiple sites: Secondary | ICD-10-CM

## 2016-05-30 DIAGNOSIS — C7951 Secondary malignant neoplasm of bone: Secondary | ICD-10-CM

## 2016-05-30 DIAGNOSIS — E041 Nontoxic single thyroid nodule: Secondary | ICD-10-CM

## 2016-05-30 DIAGNOSIS — Z940 Kidney transplant status: Secondary | ICD-10-CM

## 2016-05-30 DIAGNOSIS — R5081 Fever presenting with conditions classified elsewhere: Secondary | ICD-10-CM

## 2016-05-30 DIAGNOSIS — N186 End stage renal disease: Secondary | ICD-10-CM

## 2016-05-30 DIAGNOSIS — M129 Arthropathy, unspecified: Secondary | ICD-10-CM

## 2016-05-30 DIAGNOSIS — Z8719 Personal history of other diseases of the digestive system: Secondary | ICD-10-CM

## 2016-05-30 DIAGNOSIS — Z79899 Other long term (current) drug therapy: Secondary | ICD-10-CM

## 2016-05-30 DIAGNOSIS — D709 Neutropenia, unspecified: Secondary | ICD-10-CM

## 2016-05-30 DIAGNOSIS — I272 Pulmonary hypertension, unspecified: Secondary | ICD-10-CM

## 2016-05-30 DIAGNOSIS — R413 Other amnesia: Secondary | ICD-10-CM

## 2016-05-30 DIAGNOSIS — N529 Male erectile dysfunction, unspecified: Secondary | ICD-10-CM

## 2016-05-30 DIAGNOSIS — H9202 Otalgia, left ear: Secondary | ICD-10-CM

## 2016-05-30 DIAGNOSIS — Z808 Family history of malignant neoplasm of other organs or systems: Secondary | ICD-10-CM

## 2016-05-30 LAB — COMPREHENSIVE METABOLIC PANEL
ALT: 8 U/L — ABNORMAL LOW (ref 17–63)
AST: 20 U/L (ref 15–41)
Albumin: 3.2 g/dL — ABNORMAL LOW (ref 3.5–5.0)
Alkaline Phosphatase: 109 U/L (ref 38–126)
Anion gap: 4 — ABNORMAL LOW (ref 5–15)
BUN: 17 mg/dL (ref 6–20)
CO2: 28 mmol/L (ref 22–32)
Calcium: 10 mg/dL (ref 8.9–10.3)
Chloride: 103 mmol/L (ref 101–111)
Creatinine, Ser: 1.26 mg/dL — ABNORMAL HIGH (ref 0.61–1.24)
GFR calc Af Amer: >60 mL/min (ref >60)
GFR calc non Af Amer: 54 mL/min — ABNORMAL LOW (ref >60)
Glucose, Bld: 119 mg/dL — ABNORMAL HIGH (ref 65–99)
Potassium: 4.5 mmol/L (ref 3.5–5.1)
Sodium: 135 mmol/L (ref 135–145)
Total Bilirubin: 0.6 mg/dL (ref 0.3–1.2)
Total Protein: 5.6 g/dL — ABNORMAL LOW (ref 6.5–8.1)

## 2016-05-30 LAB — CBC WITH DIFFERENTIAL/PLATELET
Basophils Absolute: 0 10*3/uL (ref 0–0.1)
Basophils Relative: 0 %
Eosinophils Absolute: 0 10*3/uL (ref 0–0.7)
Eosinophils Relative: 0 %
HCT: 27.5 % — ABNORMAL LOW (ref 40.0–52.0)
Hemoglobin: 8.9 g/dL — ABNORMAL LOW (ref 13.0–18.0)
Lymphocytes Relative: 3 %
Lymphs Abs: 0.2 10*3/uL — ABNORMAL LOW (ref 1.0–3.6)
MCH: 25.5 pg — ABNORMAL LOW (ref 26.0–34.0)
MCHC: 32.2 g/dL (ref 32.0–36.0)
MCV: 79 fL — ABNORMAL LOW (ref 80.0–100.0)
Monocytes Absolute: 0.3 10*3/uL (ref 0.2–1.0)
Monocytes Relative: 6 %
Neutro Abs: 4.4 10*3/uL (ref 1.4–6.5)
Neutrophils Relative %: 91 %
Platelets: 133 10*3/uL — ABNORMAL LOW (ref 150–440)
RBC: 3.48 MIL/uL — ABNORMAL LOW (ref 4.40–5.90)
RDW: 23.5 % — ABNORMAL HIGH (ref 11.5–14.5)
WBC: 4.9 10*3/uL (ref 3.8–10.6)

## 2016-05-30 LAB — URIC ACID: Uric Acid, Serum: 5.5 mg/dL (ref 4.4–7.6)

## 2016-05-30 LAB — LACTATE DEHYDROGENASE: LDH: 179 U/L (ref 98–192)

## 2016-05-30 MED ORDER — SODIUM CHLORIDE 0.9 % IV SOLN
10.0000 mg | Freq: Once | INTRAVENOUS | Status: DC
  Filled 2016-05-30: qty 1

## 2016-05-30 MED ORDER — HEPARIN SOD (PORK) LOCK FLUSH 100 UNIT/ML IV SOLN: 500.0000 [IU] | Freq: Once | INTRAVENOUS | Status: DC | PRN

## 2016-05-30 MED ORDER — PEGFILGRASTIM INJECTION 6 MG/0.6ML ~~LOC~~: 6.0000 mg | PREFILLED_SYRINGE | Freq: Once | SUBCUTANEOUS | Status: DC

## 2016-05-30 MED ORDER — VINCRISTINE SULFATE CHEMO INJECTION 1 MG/ML
1.0000 mg | Freq: Once | INTRAVENOUS | Status: AC
  Administered 2016-05-30: 1 mg via INTRAVENOUS
  Filled 2016-05-30: qty 1

## 2016-05-30 MED ORDER — PEGFILGRASTIM 6 MG/0.6ML ~~LOC~~ PSKT
6.0000 mg | PREFILLED_SYRINGE | Freq: Once | SUBCUTANEOUS | Status: AC
  Administered 2016-05-30: 6 mg via SUBCUTANEOUS
  Filled 2016-05-30: qty 0.6

## 2016-05-30 MED ORDER — SODIUM CHLORIDE 0.9 % IV SOLN
Freq: Once | INTRAVENOUS | Status: AC
  Administered 2016-05-30: 12:00:00 via INTRAVENOUS
  Filled 2016-05-30: qty 1000

## 2016-05-30 MED ORDER — HEPARIN SOD (PORK) LOCK FLUSH 100 UNIT/ML IV SOLN
500.0000 [IU] | Freq: Once | INTRAVENOUS | Status: AC | PRN
  Administered 2016-05-30: 500 [IU]
  Filled 2016-05-30: qty 5

## 2016-05-30 MED ORDER — SODIUM CHLORIDE 0.9 % IV SOLN: 375.0000 mg/m2 | Freq: Once | INTRAVENOUS | Status: DC

## 2016-05-30 MED ORDER — DEXAMETHASONE SODIUM PHOSPHATE 100 MG/10ML IJ SOLN: 10.0000 mg | Freq: Once | INTRAMUSCULAR | Status: DC

## 2016-05-30 MED ORDER — DOXORUBICIN HCL CHEMO IV INJECTION 2 MG/ML
25.0000 mg/m2 | Freq: Once | INTRAVENOUS | Status: AC
  Administered 2016-05-30: 42 mg via INTRAVENOUS
  Filled 2016-05-30: qty 21

## 2016-05-30 MED ORDER — PREDNISONE 20 MG PO TABS: ORAL_TABLET | ORAL | 1 refills | Status: DC

## 2016-05-30 MED ORDER — SODIUM CHLORIDE 0.9 % IV SOLN
375.0000 mg/m2 | Freq: Once | INTRAVENOUS | Status: AC
  Administered 2016-05-30: 600 mg via INTRAVENOUS
  Filled 2016-05-30: qty 50

## 2016-05-30 MED ORDER — SODIUM CHLORIDE 0.9% FLUSH
10.0000 mL | INTRAVENOUS | Status: DC | PRN
  Administered 2016-05-30: 10 mL
  Filled 2016-05-30: qty 10

## 2016-05-30 MED ORDER — DIPHENHYDRAMINE HCL 25 MG PO CAPS
50.0000 mg | ORAL_CAPSULE | Freq: Once | ORAL | Status: AC
  Administered 2016-05-30: 50 mg via ORAL
  Filled 2016-05-30: qty 2

## 2016-05-30 MED ORDER — ACETAMINOPHEN 325 MG PO TABS
650.0000 mg | ORAL_TABLET | Freq: Once | ORAL | Status: AC
  Administered 2016-05-30: 650 mg via ORAL
  Filled 2016-05-30: qty 2

## 2016-05-30 MED ORDER — PALONOSETRON HCL INJECTION 0.25 MG/5ML: 0.2500 mg | Freq: Once | INTRAVENOUS | Status: DC

## 2016-05-30 MED ORDER — DEXAMETHASONE SODIUM PHOSPHATE 10 MG/ML IJ SOLN
10.0000 mg | Freq: Once | INTRAMUSCULAR | Status: AC
  Administered 2016-05-30: 10 mg via INTRAVENOUS
  Filled 2016-05-30: qty 1

## 2016-05-30 MED ORDER — PALONOSETRON HCL INJECTION 0.25 MG/5ML
0.2500 mg | Freq: Once | INTRAVENOUS | Status: AC
  Administered 2016-05-30: 0.25 mg via INTRAVENOUS
  Filled 2016-05-30: qty 5

## 2016-05-30 MED ORDER — ACETAMINOPHEN 325 MG PO TABS: 650.0000 mg | ORAL_TABLET | Freq: Once | ORAL | Status: DC

## 2016-05-30 MED ORDER — SODIUM CHLORIDE 0.9 % IV SOLN: Freq: Once | INTRAVENOUS | Status: DC

## 2016-05-30 MED ORDER — SODIUM CHLORIDE 0.9 % IV SOLN
400.0000 mg/m2 | Freq: Once | INTRAVENOUS | Status: AC
  Administered 2016-05-30: 660 mg via INTRAVENOUS
  Filled 2016-05-30: qty 33

## 2016-05-30 MED ORDER — DIPHENHYDRAMINE HCL 25 MG PO CAPS: 50.0000 mg | ORAL_CAPSULE | Freq: Once | ORAL | Status: DC

## 2016-05-30 MED ORDER — SODIUM CHLORIDE 0.9% FLUSH
10.0000 mL | INTRAVENOUS | Status: DC | PRN
  Filled 2016-05-30: qty 10

## 2016-05-30 NOTE — Progress Notes (Signed)
[facility] day:  05/30/2016   Chief Complaint: Matthew Brown is a 75 y.o. male with post-transplant lymphoproliferative disorder, stage IVBE diffuse large B cell lymphoma, who is seen for assessment prior to cycle #2 mini-RCHOP.  HPI:  The patient was last seen in the medical oncology clinic on 05/26/2016.  At that time, he was day 18 s/p cycle #1 mini-RCHOP.  He was completing a course of Levaquin for fever and neutropenia.  Counts were improving.  He was scheduled for LP with IT MTX on 05/29/2016.  Procedure was in interventional radiology.  Patient was noted to have extensive post-surgical and degenerative changes in the lumbar spine.  Despite multiple attempts, access to the lumbar canal was not obtained.  Symptomatically, he notes left ear pain.  He has had some gout pain in his right foot.  He is taking allopurinol.   Past Medical History:  Diagnosis Date  . Benign prostatic hypertrophy   . Chronic headache 10/19/2015  . ED (erectile dysfunction)   . End stage renal disease ([place])   . Essential hypertension   . GERD (gastroesophageal reflux disease)   . GIB (gastrointestinal bleeding)    a. 08/3293 s/p R colic artery embolization;  b. 02/2015 EGD: duod ulcerative mass->Bx notable for coagulative necrosis - ? ischemia vs thrombosis-->coumadin d/c'd.  . Gout   . Hearing loss   . Hemorrhoids   . Hyperlipidemia   . Lymphoma ([place])   . Lymphoma ([place]) 2017  . Osteoarthrosis, unspecified whether generalized or localized, lower leg   . Persistent atrial fibrillation ([place])    a. CHA2DS2VASc = 3-->coumadin d/c'd 02/2015 2/2 recurrent GIB.  [name] Prostatitis   . Pulmonary hypertension    a. 10/2014 Echo: EF 60-65%, mild to mod MR, mildly dil LA, nl RV, PASP 17mHg.  .Marland KitchenRenal transplant recipient   . Ulcers of both great toes ([facility]     Past Surgical History:  Procedure Laterality Date  . BACK SURGERY    . ESOPHAGOGASTRODUODENOSCOPY  03/13/15    severe esophagitis, ulcerated mass  . HERNIA REPAIR  1974  . PERIPHERAL VASCULAR CATHETERIZATION N/A 05/07/2016   Procedure: PGlori LuisCath Insertion;  Surgeon: JAlgernon Huxley MD;  [facility];  Service: Cardiovascular;  Laterality: N/A;  . PROSTATE ABLATION    . STOMACH SURGERY     blood vessel burst  . THROAT SURGERY    . TOTAL KNEE ARTHROPLASTY      Family History  Problem Relation Age of Onset  . Cancer Mother     throat  . Diabetes Brother   . Heart disease Brother   . Stroke Brother   . Hypertension Brother   . Diabetes Sister   . Heart disease Sister   . Hypertension Sister   . Diabetes Sister   . Diabetes Brother   . COPD Neg Hx   . Kidney disease Neg Hx   . Prostate cancer Neg Hx     Social History:  reports that he quit smoking about 37 years ago. His smoking use included Cigarettes. He has a 25.00 pack-year smoking history. He has never used smokeless tobacco. He reports that he does not drink alcohol or use drugs.  He stopped smoking in 1981.  He smoked 3 cigarettes/day.  [place]  The patient is accompanied by his wife, [name]  today.  Allergies: No Known Allergies  Current Medications: Current Outpatient Prescriptions  Medication Sig Dispense Refill  .  acetaminophen (TYLENOL) 325 MG tablet Take 2 tablets (650 mg total) by mouth every 6 (six) hours as needed for mild pain (or Fever >/= 101).    [name] albuterol (PROAIR HFA) 108 (90 BASE) MCG/ACT inhaler Inhale 1-2 puffs into the lungs every 4 (four) hours as needed.     [name] allopurinol (ZYLOPRIM) 100 MG tablet Take 100 mg by mouth daily.      . diphenhydrAMINE (BENADRYL) 25 mg capsule Take 1 capsule (25 mg total) by mouth at bedtime as needed for sleep. 30 capsule 0  . docusate sodium (COLACE) 100 MG capsule Take 1 capsule (100 mg total) by mouth 2 (two) times daily. 10 capsule 0  . feeding supplement, ENSURE ENLIVE, (ENSURE ENLIVE) LIQD Take 237 mLs by mouth 3 (three) times daily between  meals. 90 Bottle 0  . finasteride (PROSCAR) 5 MG tablet Take 5 mg by mouth daily.      . furosemide (LASIX) 80 MG tablet Take 1 tablet by mouth daily as needed.    . hydroxypropyl methylcellulose (ISOPTO TEARS) 2.5 % ophthalmic solution Place 1 drop into both eyes as needed.     . lactulose (CHRONULAC) 10 GM/15ML solution Take 7.5-15 mLs (5-10 g total) by mouth daily as needed for mild constipation. 473 mL 1  . Multiple Vitamin (MULTIVITAMIN) tablet Take 1 tablet by mouth daily.      . ondansetron (ZOFRAN) 4 MG tablet Take 1 tablet (4 mg total) by mouth every 6 (six) hours as needed for nausea. 20 tablet 0  . oxybutynin (DITROPAN-XL) 5 MG 24 hr tablet Take 5 mg by mouth daily.    [name] oxyCODONE-acetaminophen (ROXICET) 5-325 MG tablet Take 0.5-1 tablets by mouth every 6 (six) hours as needed for severe pain. 30 tablet 0  . pantoprazole (PROTONIX) 40 MG tablet Take 1 tablet (40 mg total) by mouth 2 (two) times daily. 60 tablet 0  . polyethylene glycol (MIRALAX / GLYCOLAX) packet Take 17 g by mouth daily as needed (constipation). 14 each 0  . predniSONE (DELTASONE) 5 MG tablet Take 5 mg by mouth daily.     . prochlorperazine (COMPAZINE) 10 MG tablet Take 1 tablet by mouth daily as needed.    . simethicone (MYLICON) 80 MG chewable tablet Chew 1 tablet (80 mg total) by mouth every 6 (six) hours as needed for flatulence. 30 tablet 0  . sucralfate (CARAFATE) 1 g tablet Take 1 tablet (1 g total) by mouth 4 (four) times daily. Resume taking after one week- once finished taking oral levaquine. ( to avoid interaction.) 40 tablet 3  . tacrolimus (PROGRAF) 1 MG capsule Take 3 mg by mouth 2 (two) times daily. Reported on 08/16/2015    . Tamsulosin HCl (FLOMAX) 0.4 MG CAPS Take 0.4 mg by mouth daily.      [name] COLCRYS 0.6 MG tablet Take 1 tablet by mouth 2 (two) times daily as needed.    . feeding supplement (BOOST / RESOURCE BREEZE) LIQD Take 1 Container by mouth 3 (three) times daily between meals. (Patient not  taking: Reported on 05/30/2016) 90 Container 0  . HYDROcodone-acetaminophen (NORCO) 5-325 MG tablet Take 1 tablet by mouth every 4 (four) hours as needed for moderate pain. (Patient not taking: Reported on 05/30/2016) 30 tablet 0   No current facility-administered medications for this visit.    Facility-Administered Medications Ordered in Other Visits  Medication Dose Route Frequency Provider Last Rate Last Dose  . methotrexate (PF) 12 mg in sodium chloride 0.9 % INTRATHECAL chemo  injection   Intrathecal Once Lequita Asal, MD        Review of Systems:  GENERAL:  Feels "ok".  No fevers or sweats.  Weight up 1 pound. PERFORMANCE STATUS (ECOG):  1 HEENT:  Left ear pain.  No visual changes, sore throat, mouth sores or tenderness. Lungs: No shortness of breath or cough.  No hemoptysis. Cardiac:  No chest pain, palpitations, orthopnea, or PND. GI:  Appetite, improved.  No nausea, vomiting, diarrhea, constipation, melena or hematochezia. GU:  Enlarged prostate.  No urgency, frequency, dysuria, or hematuria. Musculoskeletal:  Back pain, chronic.  No joint pain.  No muscle tenderness. Extremities:  No pain or swelling. Skin:  No rashes or skin changes. Neuro:  Chronic headaches.  No numbness or weakness, balance or coordination issues. Endocrine:  No diabetes, thyroid issues, hot flashes or night sweats. Psych:  No mood changes, depression or anxiety. Pain:  Intermittent back pain. Review of systems:  All other systems reviewed and found to be negative.  Physical Exam: Blood pressure 125/69, pulse 65, temperature 97.8 F (36.6 C), temperature source Tympanic, resp. rate 18, weight 138 lb 11.2 oz (62.9 kg). GENERAL:  Thin elderly gentleman sitting in a wheelchair in the exam room in no acute distress. MENTAL STATUS:  Alert and oriented to person, place and time. HEAD:  Lu [place]el.  Normocephalic, atraumatic, face symmetric, no Cushingoid features. EYES:  Glasses.  Brown eyes.  Pupils  equal round and reactive to light and accomodation.  No conjunctivitis or scleral icterus. ENT:  Oropharynx clear without lesion.  Tympanic membranes are benign.  Edentulous.  Tongue normal. Mucous membranes dry.  RESPIRATORY:  Clear to auscultation without rales, wheezes or rhonchi. CARDIOVASCULAR:  Regular rate and rhythm without murmur, rub or gallop. ABDOMEN:  Soft, non-tender, with active bowel sounds, and no hepatosplenomegaly.  No masses.   SKIN:  No rashes, ulcers or lesions. EXTREMITIES:  No edema, no skin discoloration or tenderness.  No palpable cords. NEUROLOGICAL: Unremarkable. PSYCH:  Appropriate.   Appointment on 05/30/2016  Component Date Value Ref Range Status  . WBC 05/30/2016 4.9  3.8 - 10.6 K/uL Final  . RBC 05/30/2016 3.48* 4.40 - 5.90 MIL/uL Final  . Hemoglobin 05/30/2016 8.9* 13.0 - 18.0 g/dL Final  . HCT 05/30/2016 27.5* 40.0 - 52.0 % Final  . MCV 05/30/2016 79.0* 80.0 - 100.0 fL Final  . MCH 05/30/2016 25.5* 26.0 - 34.0 pg Final  . MCHC 05/30/2016 32.2  32.0 - 36.0 g/dL Final  . RDW 05/30/2016 23.5* 11.5 - 14.5 % Final  . Platelets 05/30/2016 133* 150 - 440 K/uL Final  . Neutrophils Relative % 05/30/2016 91  % Final  . Neutro Abs 05/30/2016 4.4  1.4 - 6.5 K/uL Final  . Lymphocytes Relative 05/30/2016 3  % Final  . Lymphs Abs 05/30/2016 0.2* 1.0 - 3.6 K/uL Final  . Monocytes Relative 05/30/2016 6  % Final  . Monocytes Absolute 05/30/2016 0.3  0.2 - 1.0 K/uL Final  . Eosinophils Relative 05/30/2016 0  % Final  . Eosinophils Absolute 05/30/2016 0.0  0 - 0.7 K/uL Final  . Basophils Relative 05/30/2016 0  % Final  . Basophils Absolute 05/30/2016 0.0  0 - 0.1 K/uL Final  . Sodium 05/30/2016 135  135 - 145 mmol/L Final  . Potassium 05/30/2016 4.5  3.5 - 5.1 mmol/L Final  . Chloride 05/30/2016 103  101 - 111 mmol/L Final  . CO2 05/30/2016 28  22 - 32 mmol/L Final  . Glucose,  Bld 05/30/2016 119* 65 - 99 mg/dL Final  . BUN 05/30/2016 17  6 - 20 mg/dL Final  .  Creatinine, Ser 05/30/2016 1.26* 0.61 - 1.24 mg/dL Final  . Calcium 05/30/2016 10.0  8.9 - 10.3 mg/dL Final  . Total Protein 05/30/2016 5.6* 6.5 - 8.1 g/dL Final  . Albumin 05/30/2016 3.2* 3.5 - 5.0 g/dL Final  . AST 05/30/2016 20  15 - 41 U/L Final  . ALT 05/30/2016 8* 17 - 63 U/L Final  . Alkaline Phosphatase 05/30/2016 109  38 - 126 U/L Final  . Total Bilirubin 05/30/2016 0.6  0.3 - 1.2 mg/dL Final  . GFR calc non Af Amer 05/30/2016 54* >60 mL/min Final  . GFR calc Af Amer 05/30/2016 >60  >60 mL/min Final   Comment: (NOTE) The eGFR has been calculated using the CKD EPI equation. This calculation has not been validated in all clinical situations. eGFR's persistently <60 mL/min signify possible Chronic Kidney Disease.   . Anion gap 05/30/2016 4* 5 - 15 Final  . LDH 05/30/2016 179  98 - 192 U/L Final  Appointment on 05/28/2016  Component Date Value Ref Range Status  . WBC 05/28/2016 5.5  3.8 - 10.6 K/uL Final  . RBC 05/28/2016 3.39* 4.40 - 5.90 MIL/uL Final  . Hemoglobin 05/28/2016 8.7* 13.0 - 18.0 g/dL Final  . HCT 05/28/2016 26.8* 40.0 - 52.0 % Final  . MCV 05/28/2016 79.0* 80.0 - 100.0 fL Final  . MCH 05/28/2016 25.5* 26.0 - 34.0 pg Final  . MCHC 05/28/2016 32.3  32.0 - 36.0 g/dL Final  . RDW 05/28/2016 22.9* 11.5 - 14.5 % Final  . Platelets 05/28/2016 138* 150 - 440 K/uL Final  . Neutrophils Relative % 05/28/2016 86  % Final  . Neutro Abs 05/28/2016 4.7  1.4 - 6.5 K/uL Final  . Lymphocytes Relative 05/28/2016 6  % Final  . Lymphs Abs 05/28/2016 0.3* 1.0 - 3.6 K/uL Final  . Monocytes Relative 05/28/2016 8  % Final  . Monocytes Absolute 05/28/2016 0.4  0.2 - 1.0 K/uL Final  . Eosinophils Relative 05/28/2016 0  % Final  . Eosinophils Absolute 05/28/2016 0.0  0 - 0.7 K/uL Final  . Basophils Relative 05/28/2016 0  % Final  . Basophils Absolute 05/28/2016 0.0  0 - 0.1 K/uL Final  . Sodium 05/28/2016 133* 135 - 145 mmol/L Final  . Potassium 05/28/2016 4.1  3.5 - 5.1 mmol/L Final   . Chloride 05/28/2016 103  101 - 111 mmol/L Final  . CO2 05/28/2016 26  22 - 32 mmol/L Final  . Glucose, Bld 05/28/2016 100* 65 - 99 mg/dL Final  . BUN 05/28/2016 14  6 - 20 mg/dL Final  . Creatinine, Ser 05/28/2016 1.10  0.61 - 1.24 mg/dL Final  . Calcium 05/28/2016 9.1  8.9 - 10.3 mg/dL Final  . GFR calc non Af Amer 05/28/2016 >60  >60 mL/min Final  . GFR calc Af Amer 05/28/2016 >60  >60 mL/min Final   Comment: (NOTE) The eGFR has been calculated using the CKD EPI equation. This calculation has not been validated in all clinical situations. eGFR's persistently <60 mL/min signify possible Chronic Kidney Disease.   . Anion gap 05/28/2016 4* 5 - 15 Final  . Prothrombin Time 05/28/2016 16.0* 11.4 - 15.2 seconds Final  . INR 05/28/2016 1.27   Final  . aPTT 05/28/2016 37* 24 - 36 seconds Final   Comment:        IF BASELINE aPTT IS ELEVATED, SUGGEST PATIENT RISK  ASSESSMENT BE USED TO DETERMINE APPROPRIATE ANTICOAGULANT THERAPY.     Assessment:  CHIKE FARRINGTON is a 75 y.o. male s/p renal transplant (2007) with a post-transplant lymphoproliferative disorder, stage IV diffuse large B cell lymphoma.  He presented with a 2-3 month history of progressive back pain superimposed on chronic back pain.    PET scan on 04/28/2016 revealed bulky intensely hypermetabolic periaortic upper abdominal and mesenteric adenopathy concerning for high-grade lymphoma.  There was hypermetabolic liver metastasis.  There was hpermetabolic lesion involving the small bowel of the upper pelvis.  There were multiple sites of hypermetabolic skeletal metastasis. There was moderate volume right pneumothorax.  CT guided retroperitoneal node biopsy on 04/30/2016 revealed diffuse large B cell lymphoma.  Hepatitis B and C testing on 05/06/2016 were negative.  Echo on 05/06/2016 revealed an EF of 55-60%.  Bone marrow aspirate and biopsy on 05/08/2016 revealed multifocal marrow involvement by diffuse large B-cell lymphoma. There  was variably cellular marrow for age (50% - 90%) with a patchy predominantly nodular large B-cell infiltrate, overall estimated to account for 20% of the core biopsy. There was adequate residual trilineage hematopoiesis with mild nonspecific dyserythropoiesis. There was patchy mild increase in reticulin. Storage iron was present. The immunohistochemical staining pattern of the B-cell infiltrate (CD10 +/-, BCL 6+, BCL-2 +) suggested possible large cell transformation of follicular lymphoma.  Flow cytometry revealed no significant immunophenotypic abnormalities or evidence of B-cell lymphoma.  He has bone metastasis and hypercalcemia.  Calcium was 11.2 (ionized 7.3) on 05/13/2016.  He received Zometa on 05/13/2016.  Work-up on 04/15/2016 revealed the following normal studies: ferritin (343), iron saturation (6%), TIBC (241; low), B12 (525), folate (27).  Reticulocyte count was 2%.  LDH was 409.  Uric acid was 7.7 (4.4 - 7.6).  He was [facility] from 04/28/2016 - 04/30/2016 with a moderate volume right sided pneumothorax.  His pneumothorax improved spontaneously. Plain films of the right femur revealed the lucent bone lesion in the right femoral neck  was poorly characterized.  There was no evidence for an acute fracture.  PTH was 106 (high) 04/30/2016 with a calcium of 11.2.  Etiology was c/w primary hyperparathyroidism.  PTH-related polypeptide was < 1.1 on 04/30/2016.  He has a history of renal failure s/p renal transplant.  He is tacrolimus (Prograf), and steroids  Creatinine has ranged between 1.43 - 1.93 in the past 6 months. Mycophenolate (MMF) was discontinued at diagnosis.  Prograf level was 5.5 (3.0-8.0) on 05/08/2016.   He is currently day 22 s/p cycle #1 mini-RCHOP (05/09/2016).  Cycle #1 was complicated by fever and neutropenia.  Cultures were negative.  He was treated empirically with Cefepime then oral Levaquin.  Peripheral smear revealed schistocytes.  He requries prophylactic  intrathecal methotrexate.  Lumbar puncture was unsuccessful on 05/29/2016 secondary to extensive post-surgical and degenerative changes in the lumbar spine.   Symptomatically, he feels "ok".  He is gaining weight.  Exam is stable.  Plan: 1.  Labs today:  CBC with diff, CMP, LDH, uric acid. 2.  Discuss inability to perform LP with prophylactic MTX yesterday.  Plan to discuss with neurosurgery re: options. 3.  Cycle #2 mini-RCHOP with On-Pro Neulasta. 4.  Discuss 5 day prednisone pulse. 5.  Discuss use of Claritin. 6.  RTC in 06/06/2016 for labs (CBC with diff, BMP, albumen, uric acid) 7.  RTC on 06/09/2016 for MD assessment, labs (CBC with diff, BMP, uric acid) + Billey Chang, MD  05/30/2016, 11:53 AM

## 2016-05-30 NOTE — Progress Notes (Signed)
No new dx since last visit. C/o of gout pain R bunion per pt. Pain in left ear also  Per pt

## 2016-06-02 ENCOUNTER — Telehealth: Payer: Self-pay | Admitting: *Deleted

## 2016-06-02 NOTE — Telephone Encounter (Signed)
Patient contacted by another RN

## 2016-06-02 NOTE — Telephone Encounter (Signed)
Called pt thoughout weekend and could never get the phones to answer. The cell phone that is in the chart states it is no longer valid.  Then I called pharmacy this weekend and they had a different number and I tried this weekend and it said that is was not accepting phone call.  I did get in touch with Matthew Brown today and told her  That the patient can have cochicine-it is ok to have with the chemo and the colchicine . She will start giving it to him today.

## 2016-06-04 LAB — COMP PANEL: LEUKEMIA/LYMPHOMA

## 2016-06-06 ENCOUNTER — Other Ambulatory Visit: Payer: Self-pay

## 2016-06-06 ENCOUNTER — Ambulatory Visit
Admission: RE | Admit: 2016-06-06 | Discharge: 2016-06-06 | Disposition: A | Discharge status: Home / Self Care | Payer: Commercial Managed Care - HMO | Source: Ambulatory Visit | Attending: Family Medicine | Admitting: Family Medicine

## 2016-06-06 ENCOUNTER — Other Ambulatory Visit: Payer: Self-pay | Admitting: Hematology and Oncology

## 2016-06-06 ENCOUNTER — Encounter: Payer: Self-pay | Admitting: Family Medicine

## 2016-06-06 ENCOUNTER — Inpatient Hospital Stay: Payer: Commercial Managed Care - HMO

## 2016-06-06 DIAGNOSIS — I6523 Occlusion and stenosis of bilateral carotid arteries: Secondary | ICD-10-CM | POA: Diagnosis present

## 2016-06-06 DIAGNOSIS — E042 Nontoxic multinodular goiter: Secondary | ICD-10-CM | POA: Insufficient documentation

## 2016-06-06 DIAGNOSIS — Z5112 Encounter for antineoplastic immunotherapy: Secondary | ICD-10-CM | POA: Diagnosis not present

## 2016-06-06 DIAGNOSIS — C8338 Diffuse large B-cell lymphoma, lymph nodes of multiple sites: Secondary | ICD-10-CM

## 2016-06-06 DIAGNOSIS — C787 Secondary malignant neoplasm of liver and intrahepatic bile duct: Secondary | ICD-10-CM

## 2016-06-06 DIAGNOSIS — C8333 Diffuse large B-cell lymphoma, intra-abdominal lymph nodes: Secondary | ICD-10-CM

## 2016-06-06 DIAGNOSIS — C7951 Secondary malignant neoplasm of bone: Secondary | ICD-10-CM

## 2016-06-06 HISTORY — DX: Nontoxic multinodular goiter: E04.2

## 2016-06-06 LAB — CBC WITH DIFFERENTIAL/PLATELET
Basophils Absolute: 0 10*3/uL (ref 0–0.1)
Basophils Relative: 0 %
Eosinophils Absolute: 0.1 10*3/uL (ref 0–0.7)
Eosinophils Relative: 2 %
HCT: 26.6 % — ABNORMAL LOW (ref 40.0–52.0)
Hemoglobin: 8.6 g/dL — ABNORMAL LOW (ref 13.0–18.0)
Lymphocytes Relative: 6 %
Lymphs Abs: 0.3 10*3/uL — ABNORMAL LOW (ref 1.0–3.6)
MCH: 25.6 pg — ABNORMAL LOW (ref 26.0–34.0)
MCHC: 32.5 g/dL (ref 32.0–36.0)
MCV: 78.8 fL — ABNORMAL LOW (ref 80.0–100.0)
Monocytes Absolute: 0.3 10*3/uL (ref 0.2–1.0)
Monocytes Relative: 7 %
Neutro Abs: 3.4 10*3/uL (ref 1.4–6.5)
Neutrophils Relative %: 85 %
Platelets: 116 10*3/uL — ABNORMAL LOW (ref 150–440)
RBC: 3.38 MIL/uL — ABNORMAL LOW (ref 4.40–5.90)
RDW: 21.8 % — ABNORMAL HIGH (ref 11.5–14.5)
WBC: 4 10*3/uL (ref 3.8–10.6)

## 2016-06-06 LAB — BASIC METABOLIC PANEL
Anion gap: 3 — ABNORMAL LOW (ref 5–15)
BUN: 29 mg/dL — ABNORMAL HIGH (ref 6–20)
CO2: 31 mmol/L (ref 22–32)
Calcium: 10.4 mg/dL — ABNORMAL HIGH (ref 8.9–10.3)
Chloride: 104 mmol/L (ref 101–111)
Creatinine, Ser: 0.96 mg/dL (ref 0.61–1.24)
GFR calc Af Amer: >60 mL/min (ref >60)
GFR calc non Af Amer: >60 mL/min (ref >60)
Glucose, Bld: 114 mg/dL — ABNORMAL HIGH (ref 65–99)
Potassium: 4.2 mmol/L (ref 3.5–5.1)
Sodium: 138 mmol/L (ref 135–145)

## 2016-06-06 LAB — ALBUMIN: Albumin: 3.4 g/dL — ABNORMAL LOW (ref 3.5–5.0)

## 2016-06-06 LAB — URIC ACID: Uric Acid, Serum: 6.4 mg/dL (ref 4.4–7.6)

## 2016-06-09 ENCOUNTER — Inpatient Hospital Stay: Payer: Commercial Managed Care - HMO

## 2016-06-09 ENCOUNTER — Other Ambulatory Visit: Payer: Self-pay | Admitting: Family Medicine

## 2016-06-09 ENCOUNTER — Inpatient Hospital Stay (HOSPITAL_BASED_OUTPATIENT_CLINIC_OR_DEPARTMENT_OTHER): Payer: Commercial Managed Care - HMO | Admitting: Hematology and Oncology

## 2016-06-09 ENCOUNTER — Other Ambulatory Visit: Payer: Self-pay

## 2016-06-09 VITALS — BP 119/72 | HR 64 | Temp 98.0°F | Resp 18 | Wt 128.2 lb

## 2016-06-09 DIAGNOSIS — R5081 Fever presenting with conditions classified elsewhere: Secondary | ICD-10-CM

## 2016-06-09 DIAGNOSIS — C7951 Secondary malignant neoplasm of bone: Secondary | ICD-10-CM

## 2016-06-09 DIAGNOSIS — N186 End stage renal disease: Secondary | ICD-10-CM

## 2016-06-09 DIAGNOSIS — T8699 Other complications of unspecified transplanted organ and tissue: Secondary | ICD-10-CM

## 2016-06-09 DIAGNOSIS — Z79899 Other long term (current) drug therapy: Secondary | ICD-10-CM

## 2016-06-09 DIAGNOSIS — E041 Nontoxic single thyroid nodule: Secondary | ICD-10-CM

## 2016-06-09 DIAGNOSIS — M549 Dorsalgia, unspecified: Secondary | ICD-10-CM | POA: Diagnosis not present

## 2016-06-09 DIAGNOSIS — M109 Gout, unspecified: Secondary | ICD-10-CM

## 2016-06-09 DIAGNOSIS — C8338 Diffuse large B-cell lymphoma, lymph nodes of multiple sites: Secondary | ICD-10-CM | POA: Diagnosis not present

## 2016-06-09 DIAGNOSIS — I4891 Unspecified atrial fibrillation: Secondary | ICD-10-CM

## 2016-06-09 DIAGNOSIS — D709 Neutropenia, unspecified: Secondary | ICD-10-CM

## 2016-06-09 DIAGNOSIS — M79671 Pain in right foot: Secondary | ICD-10-CM

## 2016-06-09 DIAGNOSIS — C787 Secondary malignant neoplasm of liver and intrahepatic bile duct: Secondary | ICD-10-CM

## 2016-06-09 DIAGNOSIS — M129 Arthropathy, unspecified: Secondary | ICD-10-CM

## 2016-06-09 DIAGNOSIS — Z87891 Personal history of nicotine dependence: Secondary | ICD-10-CM

## 2016-06-09 DIAGNOSIS — D47Z1 Post-transplant lymphoproliferative disorder (PTLD): Secondary | ICD-10-CM

## 2016-06-09 DIAGNOSIS — Z940 Kidney transplant status: Secondary | ICD-10-CM

## 2016-06-09 DIAGNOSIS — E785 Hyperlipidemia, unspecified: Secondary | ICD-10-CM

## 2016-06-09 DIAGNOSIS — G4700 Insomnia, unspecified: Secondary | ICD-10-CM | POA: Diagnosis not present

## 2016-06-09 DIAGNOSIS — Z808 Family history of malignant neoplasm of other organs or systems: Secondary | ICD-10-CM

## 2016-06-09 DIAGNOSIS — I272 Pulmonary hypertension, unspecified: Secondary | ICD-10-CM

## 2016-06-09 DIAGNOSIS — K219 Gastro-esophageal reflux disease without esophagitis: Secondary | ICD-10-CM

## 2016-06-09 DIAGNOSIS — C8333 Diffuse large B-cell lymphoma, intra-abdominal lymph nodes: Secondary | ICD-10-CM

## 2016-06-09 DIAGNOSIS — I7090 Unspecified atherosclerosis: Secondary | ICD-10-CM

## 2016-06-09 DIAGNOSIS — I6523 Occlusion and stenosis of bilateral carotid arteries: Secondary | ICD-10-CM

## 2016-06-09 DIAGNOSIS — R413 Other amnesia: Secondary | ICD-10-CM

## 2016-06-09 DIAGNOSIS — Z5112 Encounter for antineoplastic immunotherapy: Secondary | ICD-10-CM | POA: Diagnosis not present

## 2016-06-09 DIAGNOSIS — Z7952 Long term (current) use of systemic steroids: Secondary | ICD-10-CM

## 2016-06-09 DIAGNOSIS — N400 Enlarged prostate without lower urinary tract symptoms: Secondary | ICD-10-CM

## 2016-06-09 DIAGNOSIS — N529 Male erectile dysfunction, unspecified: Secondary | ICD-10-CM

## 2016-06-09 DIAGNOSIS — Z8719 Personal history of other diseases of the digestive system: Secondary | ICD-10-CM

## 2016-06-09 DIAGNOSIS — E210 Primary hyperparathyroidism: Secondary | ICD-10-CM

## 2016-06-09 DIAGNOSIS — I129 Hypertensive chronic kidney disease with stage 1 through stage 4 chronic kidney disease, or unspecified chronic kidney disease: Secondary | ICD-10-CM

## 2016-06-09 DIAGNOSIS — H9202 Otalgia, left ear: Secondary | ICD-10-CM

## 2016-06-09 DIAGNOSIS — M1990 Unspecified osteoarthritis, unspecified site: Secondary | ICD-10-CM

## 2016-06-09 LAB — CBC WITH DIFFERENTIAL/PLATELET
Basophils Absolute: 0 10*3/uL (ref 0–0.1)
Basophils Relative: 0 %
Eosinophils Absolute: 0 10*3/uL (ref 0–0.7)
Eosinophils Relative: 0 %
HCT: 25.5 % — ABNORMAL LOW (ref 40.0–52.0)
Hemoglobin: 8.3 g/dL — ABNORMAL LOW (ref 13.0–18.0)
Lymphocytes Relative: 3 %
Lymphs Abs: 0.2 10*3/uL — ABNORMAL LOW (ref 1.0–3.6)
MCH: 26 pg (ref 26.0–34.0)
MCHC: 32.5 g/dL (ref 32.0–36.0)
MCV: 80.2 fL (ref 80.0–100.0)
Monocytes Absolute: 0.5 10*3/uL (ref 0.2–1.0)
Monocytes Relative: 6 %
Neutro Abs: 6.9 10*3/uL — ABNORMAL HIGH (ref 1.4–6.5)
Neutrophils Relative %: 91 %
Platelets: 103 10*3/uL — ABNORMAL LOW (ref 150–440)
RBC: 3.18 MIL/uL — ABNORMAL LOW (ref 4.40–5.90)
RDW: 21.8 % — ABNORMAL HIGH (ref 11.5–14.5)
WBC: 7.7 10*3/uL (ref 3.8–10.6)

## 2016-06-09 LAB — BASIC METABOLIC PANEL
Anion gap: 5 (ref 5–15)
BUN: 18 mg/dL (ref 6–20)
CO2: 30 mmol/L (ref 22–32)
Calcium: 9.7 mg/dL (ref 8.9–10.3)
Chloride: 103 mmol/L (ref 101–111)
Creatinine, Ser: 1.04 mg/dL (ref 0.61–1.24)
GFR calc Af Amer: >60 mL/min (ref >60)
GFR calc non Af Amer: >60 mL/min (ref >60)
Glucose, Bld: 141 mg/dL — ABNORMAL HIGH (ref 65–99)
Potassium: 4.5 mmol/L (ref 3.5–5.1)
Sodium: 138 mmol/L (ref 135–145)

## 2016-06-09 LAB — URIC ACID: Uric Acid, Serum: 6.1 mg/dL (ref 4.4–7.6)

## 2016-06-09 MED ORDER — DENOSUMAB 120 MG/1.7ML ~~LOC~~ SOLN
120.0000 mg | Freq: Once | SUBCUTANEOUS | Status: AC
  Administered 2016-06-09: 120 mg via SUBCUTANEOUS
  Filled 2016-06-09: qty 1.7

## 2016-06-09 NOTE — Assessment & Plan Note (Signed)
Order US

## 2016-06-09 NOTE — Progress Notes (Signed)
Matthew Brown day:  06/09/2016   Chief Complaint: Matthew Brown is a 75 y.o. male with post-transplant lymphoproliferative disorder, stage IVBE diffuse large B cell lymphoma, who is seen for nadir assessment on day 11 of cycle #2 mini-RCHOP and prior to monthly Xgeva.  HPI:  The patient was last seen in the medical oncology Brown on 05/30/2016.  At that time, he received cycle #2 mini-RCHOP with OnPro Neulasta support.  CBC on 06/06/2016 revealed a hematocrit 26.6, hemoglobin 8.6, MCV 78.8, platelets 116,000, white count 4000 with an ANC of 4400. Basic metabolic panel included a BUN of 29 and creatinine 0.96. Calcium was 10.4 and albumin of 3.4. Uric acid was 6.4.   Neurosurgery was contacted regarding his difficulty with obtaining an LP.  Recommendation was for a lumbar spine CT to assess access.  Symptomatically, he is doing well.  He denies any side effects associated with chemotherapy.  He is eating "some".   He stays "full".    Past Medical History:  Diagnosis Date  . Benign prostatic hypertrophy   . Chronic headache 10/19/2015  . ED (erectile dysfunction)   . End stage renal disease ([place])   . Essential hypertension   . GERD (gastroesophageal reflux disease)   . GIB (gastrointestinal bleeding)    a. 09/8544 s/p R colic artery embolization;  b. 02/2015 EGD: duod ulcerative mass->Bx notable for coagulative necrosis - ? ischemia vs thrombosis-->coumadin d/c'd.  . Gout   . Hearing loss   . Hemorrhoids   . Hyperlipidemia   . Lymphoma ([place])   . Lymphoma ([place]) 2017  . Multiple thyroid nodules 06/06/2016   Noted on carotid US; dedicated US to be ordered by staff  . Osteoarthrosis, unspecified whether generalized or localized, lower leg   . Persistent atrial fibrillation ([place])    a. CHA2DS2VASc = 3-->coumadin d/c'd 02/2015 2/2 recurrent GIB.  [name] Prostatitis   . Pulmonary hypertension    a. 10/2014 Echo: EF 60-65%, mild to mod MR, mildly dil LA,  nl RV, PASP 55mHg.  .Marland KitchenRenal transplant recipient   . Ulcers of both great toes ([facility]     Past Surgical History:  Procedure Laterality Date  . BACK SURGERY    . ESOPHAGOGASTRODUODENOSCOPY  03/13/15   severe esophagitis, ulcerated mass  . HERNIA REPAIR  1974  . PERIPHERAL VASCULAR CATHETERIZATION N/A 05/07/2016   Procedure: PGlori LuisCath Insertion;  Surgeon: JAlgernon Huxley MD;  [facility];  Service: Cardiovascular;  Laterality: N/A;  . PROSTATE ABLATION    . STOMACH SURGERY     blood vessel burst  . THROAT SURGERY    . TOTAL KNEE ARTHROPLASTY      Family History  Problem Relation Age of Onset  . Cancer Mother     throat  . Diabetes Brother   . Heart disease Brother   . Stroke Brother   . Hypertension Brother   . Diabetes Sister   . Heart disease Sister   . Hypertension Sister   . Diabetes Sister   . Diabetes Brother   . COPD Neg Hx   . Kidney disease Neg Hx   . Prostate cancer Neg Hx     Social History:  reports that he quit smoking about 37 years ago. His smoking use included Cigarettes. He has a 25.00 pack-year smoking history. He has never used smokeless tobacco. He reports that he does not drink alcohol or use drugs.  He stopped smoking in 1981.  He smoked 3 cigarettes/day.  He lives in [place].  The patient is accompanied by his wife, Matthew Brown,  today.  Allergies: No Known Allergies  Current Medications: Current Outpatient Prescriptions  Medication Sig Dispense Refill  . acetaminophen (TYLENOL) 325 MG tablet Take 2 tablets (650 mg total) by mouth every 6 (six) hours as needed for mild pain (or Fever >/= 101).    [name] albuterol (PROAIR HFA) 108 (90 BASE) MCG/ACT inhaler Inhale 1-2 puffs into the lungs every 4 (four) hours as needed.     [name] allopurinol (ZYLOPRIM) 100 MG tablet Take 100 mg by mouth daily.      [name] COLCRYS 0.6 MG tablet Take 1 tablet by mouth 2 (two) times daily as needed.    . diphenhydrAMINE (BENADRYL) 25 mg capsule Take 1 capsule (25 mg  total) by mouth at bedtime as needed for sleep. 30 capsule 0  . docusate sodium (COLACE) 100 MG capsule Take 1 capsule (100 mg total) by mouth 2 (two) times daily. 10 capsule 0  . feeding supplement (BOOST / RESOURCE BREEZE) LIQD Take 1 Container by mouth 3 (three) times daily between meals. 90 Container 0  . feeding supplement, ENSURE ENLIVE, (ENSURE ENLIVE) LIQD Take 237 mLs by mouth 3 (three) times daily between meals. 90 Bottle 0  . finasteride (PROSCAR) 5 MG tablet Take 5 mg by mouth daily.      . furosemide (LASIX) 80 MG tablet Take 1 tablet by mouth daily as needed.    [name] HYDROcodone-acetaminophen (NORCO) 5-325 MG tablet Take 1 tablet by mouth every 4 (four) hours as needed for moderate pain. 30 tablet 0  . hydroxypropyl methylcellulose (ISOPTO TEARS) 2.5 % ophthalmic solution Place 1 drop into both eyes as needed.     . lactulose (CHRONULAC) 10 GM/15ML solution Take 7.5-15 mLs (5-10 g total) by mouth daily as needed for mild constipation. 473 mL 1  . Multiple Vitamin (MULTIVITAMIN) tablet Take 1 tablet by mouth daily.      . ondansetron (ZOFRAN) 4 MG tablet Take 1 tablet (4 mg total) by mouth every 6 (six) hours as needed for nausea. 20 tablet 0  . oxybutynin (DITROPAN-XL) 5 MG 24 hr tablet Take 5 mg by mouth daily.    [name] oxyCODONE-acetaminophen (ROXICET) 5-325 MG tablet Take 0.5-1 tablets by mouth every 6 (six) hours as needed for severe pain. 30 tablet 0  . pantoprazole (PROTONIX) 40 MG tablet Take 1 tablet (40 mg total) by mouth 2 (two) times daily. 60 tablet 0  . polyethylene glycol (MIRALAX / GLYCOLAX) packet Take 17 g by mouth daily as needed (constipation). 14 each 0  . predniSONE (DELTASONE) 20 MG tablet Take 70 mg (3.5 pills) daily for 5 days after each cycle of chemotherapy. 45 tablet 1  . predniSONE (DELTASONE) 5 MG tablet Take 5 mg by mouth daily.     . prochlorperazine (COMPAZINE) 10 MG tablet Take 1 tablet by mouth daily as needed.    . simethicone (MYLICON) 80 MG chewable  tablet Chew 1 tablet (80 mg total) by mouth every 6 (six) hours as needed for flatulence. 30 tablet 0  . sucralfate (CARAFATE) 1 g tablet Take 1 tablet (1 g total) by mouth 4 (four) times daily. Resume taking after one week- once finished taking oral levaquine. ( to avoid interaction.) 40 tablet 3  . tacrolimus (PROGRAF) 1 MG capsule Take 3 mg by mouth 2 (two) times daily. Reported on 08/16/2015    . Tamsulosin HCl (FLOMAX) 0.4 MG CAPS Take  0.4 mg by mouth daily.       No current facility-administered medications for this visit.    Facility-Administered Medications Ordered in Other Visits  Medication Dose Route Frequency Provider Last Rate Last Dose  . methotrexate (PF) 12 mg in sodium chloride 0.9 % INTRATHECAL chemo injection   Intrathecal Once Lequita Asal, MD        Review of Systems:  GENERAL:  Fatigue.  No fevers or sweats.  Weight loss of  10 pounds. PERFORMANCE STATUS (ECOG):  1 HEENT:  No visual changes, sore throat, mouth sores or tenderness. Lungs: No shortness of breath or cough.  No hemoptysis. Cardiac:  No chest pain, palpitations, orthopnea, or PND. GI:  Feels full.  Decreased appetite.  No nausea, vomiting, diarrhea, constipation, melena or hematochezia. GU:  Enlarged prostate.  No urgency, frequency, dysuria, or hematuria. Musculoskeletal:  Back pain off/on.  No joint pain.  No muscle tenderness. Extremities:  No pain or swelling. Skin:  No rashes or skin changes. Neuro:  No numbness or weakness, balance or coordination issues. Endocrine:  No diabetes, thyroid issues, hot flashes or night sweats. Psych:  No mood changes, depression or anxiety. Pain:  Back pain, off/on. Review of systems:  All other systems reviewed and found to be negative.  Physical Exam: Blood pressure 119/72, pulse 64, temperature 98 F (36.7 C), temperature source Tympanic, resp. rate 18, weight 128 lb 3 oz (58.1 kg). GENERAL:  Thin elderly gentleman sitting comfortably in the exam room in no  acute distress. MENTAL STATUS:  Alert and oriented to person, place and time. HEAD:  Wearing a cap.  Lu [place]el.  Normocephalic, atraumatic, face symmetric, no Cushingoid features. EYES:  Glasses.  Brown eyes.  Pupils equal round and reactive to light and accomodation.  No conjunctivitis or scleral icterus. ENT:  Oropharynx clear without lesion.  Edentulous.  Tongue normal. Mucous membranes moist.  RESPIRATORY:  Clear to auscultation without rales, wheezes or rhonchi. CARDIOVASCULAR:  Regular rate and rhythm without murmur, rub or gallop. ABDOMEN:  Soft, non-tender, with active bowel sounds, and no hepatosplenomegaly.  No masses.   SKIN:  No rashes, ulcers or lesions. EXTREMITIES:  No edema, no skin discoloration or tenderness.  No palpable cords. NEUROLOGICAL: Unremarkable. PSYCH:  Appropriate.   Appointment on 06/09/2016  Component Date Value Ref Range Status  . Sodium 06/09/2016 138  135 - 145 mmol/L Final  . Potassium 06/09/2016 4.5  3.5 - 5.1 mmol/L Final  . Chloride 06/09/2016 103  101 - 111 mmol/L Final  . CO2 06/09/2016 30  22 - 32 mmol/L Final  . Glucose, Bld 06/09/2016 141* 65 - 99 mg/dL Final  . BUN 06/09/2016 18  6 - 20 mg/dL Final  . Creatinine, Ser 06/09/2016 1.04  0.61 - 1.24 mg/dL Final  . Calcium 06/09/2016 9.7  8.9 - 10.3 mg/dL Final  . GFR calc non Af Amer 06/09/2016 >60  >60 mL/min Final  . GFR calc Af Amer 06/09/2016 >60  >60 mL/min Final   Comment: (NOTE) The eGFR has been calculated using the CKD EPI equation. This calculation has not been validated in all clinical situations. eGFR's persistently <60 mL/min signify possible Chronic Kidney Disease.   . Anion gap 06/09/2016 5  5 - 15 Final  . WBC 06/09/2016 7.7  3.8 - 10.6 K/uL Final  . RBC 06/09/2016 3.18* 4.40 - 5.90 MIL/uL Final  . Hemoglobin 06/09/2016 8.3* 13.0 - 18.0 g/dL Final  . HCT 06/09/2016 25.5* 40.0 - 52.0 % Final  .  MCV 06/09/2016 80.2  80.0 - 100.0 fL Final  . MCH 06/09/2016 26.0  26.0 - 34.0  pg Final  . MCHC 06/09/2016 32.5  32.0 - 36.0 g/dL Final  . RDW 06/09/2016 21.8* 11.5 - 14.5 % Final  . Platelets 06/09/2016 103* 150 - 440 K/uL Final  . Neutrophils Relative % 06/09/2016 91  % Final  . Neutro Abs 06/09/2016 6.9* 1.4 - 6.5 K/uL Final  . Lymphocytes Relative 06/09/2016 3  % Final  . Lymphs Abs 06/09/2016 0.2* 1.0 - 3.6 K/uL Final  . Monocytes Relative 06/09/2016 6  % Final  . Monocytes Absolute 06/09/2016 0.5  0.2 - 1.0 K/uL Final  . Eosinophils Relative 06/09/2016 0  % Final  . Eosinophils Absolute 06/09/2016 0.0  0 - 0.7 K/uL Final  . Basophils Relative 06/09/2016 0  % Final  . Basophils Absolute 06/09/2016 0.0  0 - 0.1 K/uL Final    Assessment:  LENZIE SANDLER is a 75 y.o. male s/p renal transplant (2007) with a post-transplant lymphoproliferative disorder, stage IV diffuse large B cell lymphoma.  He presented with a 2-3 month history of progressive back pain superimposed on chronic back pain.    PET scan on 04/28/2016 revealed bulky intensely hypermetabolic periaortic upper abdominal and mesenteric adenopathy concerning for high-grade lymphoma.  There was hypermetabolic liver metastasis.  There was hpermetabolic lesion involving the small bowel of the upper pelvis.  There were multiple sites of hypermetabolic skeletal metastasis. There was moderate volume right pneumothorax.  CT guided retroperitoneal node biopsy on 04/30/2016 revealed diffuse large B cell lymphoma.  Hepatitis B and C testing on 05/06/2016 were negative.  Echo on 05/06/2016 revealed an EF of 55-60%.  Bone marrow aspirate and biopsy on 05/08/2016 revealed multifocal marrow involvement by diffuse large B-cell lymphoma. There was variably cellular marrow for age (50% - 90%) with a patchy predominantly nodular large B-cell infiltrate, overall estimated to account for 20% of the core biopsy. There was adequate residual trilineage hematopoiesis with mild nonspecific dyserythropoiesis. There was patchy mild increase  in reticulin. Storage iron was present. The immunohistochemical staining pattern of the B-cell infiltrate (CD10 +/-, BCL 6+, BCL-2 +) suggested possible large cell transformation of follicular lymphoma.  Flow cytometry revealed no significant immunophenotypic abnormalities or evidence of B-cell lymphoma.  He has bone metastasis and hypercalcemia.  Calcium was 11.2 (ionized 7.3) on 05/13/2016.  He received Zometa on 05/13/2016.  Anemia work-up on 04/15/2016 revealed the following normal studies: ferritin (343), iron saturation (6%), TIBC (241; low), B12 (525), folate (27).  Reticulocyte count was 2%.  LDH was 409.  Uric acid was 7.7 (4.4 - 7.6).  He was [facility] at [facility] from 04/28/2016 - 04/30/2016 with a moderate volume right sided pneumothorax.  His pneumothorax improved spontaneously. Plain films of the right femur revealed the lucent bone lesion in the right femoral neck  was poorly characterized.  There was no evidence for an acute fracture.  PTH was 106 (high) 04/30/2016 with a calcium of 11.2.  Etiology was c/w primary hyperparathyroidism.  PTH-related polypeptide was < 1.1 on 04/30/2016.  He has a history of renal failure s/p renal transplant.  He is tacrolimus (Prograf), and steroids  Creatinine has ranged between 1.43 - 1.93 in the past 6 months. Mycophenolate (MMF) was discontinued at diagnosis.  Prograf level was 5.5 (3.0-8.0) on 05/08/2016.   He is currently day 11 s/p cycle #2 mini-RCHOP (05/09/2016 - 05/30/2016).  Cycle #1 was complicated by fever and neutropenia.  Cultures were negative.  He was treated empirically with Cefepime then oral Levaquin.  Peripheral smear revealed schistocytes.  He requries prophylactic intrathecal methotrexate.  Lumbar puncture in interventional radiology was unsuccessful on 05/29/2016 secondary to extensive post-surgical and degenerative changes in the lumbar spine.   Symptomatically, he has felt full with subsequent decreased appetite and weight loss.   Exam is stable.  Calcium is 9.7.  Plan: 1.  Labs today:  CBC with diff, BMP, uric acid. 2.  Xgeva today. 3.  Encourage oral intake.  Discuss importance of caloric and fluid intake.  Continue allopurinol. 4.  Follow-up lumbar spine CT without contrast on 06/13/2016. 5.  RTC on 06/13/2016 for labs (CBC with diff and type and hold) and +/- transfusion 6.  RTC on 06/20/2016 for MD assessment, labs (CBC with diff, CMP, LDH, uric acid), and cycle #3 mini-RCHOP with OnPro Neulasta.   Lequita Asal, MD  06/09/2016, 2:55 PM

## 2016-06-09 NOTE — Progress Notes (Signed)
Need 6 month carotid US order; dx code I65.23

## 2016-06-09 NOTE — Progress Notes (Signed)
Patient states he feels good today.  He has been up walking some.

## 2016-06-10 ENCOUNTER — Other Ambulatory Visit: Payer: Self-pay | Admitting: *Deleted

## 2016-06-10 DIAGNOSIS — C8333 Diffuse large B-cell lymphoma, intra-abdominal lymph nodes: Secondary | ICD-10-CM

## 2016-06-13 ENCOUNTER — Inpatient Hospital Stay: Payer: Commercial Managed Care - HMO

## 2016-06-13 ENCOUNTER — Ambulatory Visit
Admission: RE | Admit: 2016-06-13 | Discharge: 2016-06-13 | Disposition: A | Discharge status: Home / Self Care | Payer: Commercial Managed Care - HMO | Source: Ambulatory Visit | Attending: Hematology and Oncology | Admitting: Hematology and Oncology

## 2016-06-13 DIAGNOSIS — Z5112 Encounter for antineoplastic immunotherapy: Secondary | ICD-10-CM | POA: Diagnosis not present

## 2016-06-13 DIAGNOSIS — C801 Malignant (primary) neoplasm, unspecified: Secondary | ICD-10-CM

## 2016-06-13 DIAGNOSIS — R937 Abnormal findings on diagnostic imaging of other parts of musculoskeletal system: Secondary | ICD-10-CM | POA: Diagnosis not present

## 2016-06-13 DIAGNOSIS — C787 Secondary malignant neoplasm of liver and intrahepatic bile duct: Secondary | ICD-10-CM

## 2016-06-13 DIAGNOSIS — Z981 Arthrodesis status: Secondary | ICD-10-CM | POA: Diagnosis not present

## 2016-06-13 DIAGNOSIS — C7951 Secondary malignant neoplasm of bone: Secondary | ICD-10-CM

## 2016-06-13 DIAGNOSIS — C8338 Diffuse large B-cell lymphoma, lymph nodes of multiple sites: Secondary | ICD-10-CM

## 2016-06-13 LAB — CBC WITH DIFFERENTIAL/PLATELET
Basophils Absolute: 0.1 10*3/uL (ref 0–0.1)
Basophils Relative: 1 %
Eosinophils Absolute: 0 10*3/uL (ref 0–0.7)
Eosinophils Relative: 0 %
HCT: 26.7 % — ABNORMAL LOW (ref 40.0–52.0)
Hemoglobin: 8.6 g/dL — ABNORMAL LOW (ref 13.0–18.0)
Lymphocytes Relative: 4 %
Lymphs Abs: 0.5 10*3/uL — ABNORMAL LOW (ref 1.0–3.6)
MCH: 26.1 pg (ref 26.0–34.0)
MCHC: 32.1 g/dL (ref 32.0–36.0)
MCV: 81.1 fL (ref 80.0–100.0)
Monocytes Absolute: 0.6 10*3/uL (ref 0.2–1.0)
Monocytes Relative: 6 %
Neutro Abs: 10.1 10*3/uL — ABNORMAL HIGH (ref 1.4–6.5)
Neutrophils Relative %: 89 %
Platelets: 128 10*3/uL — ABNORMAL LOW (ref 150–440)
RBC: 3.29 MIL/uL — ABNORMAL LOW (ref 4.40–5.90)
RDW: 23.3 % — ABNORMAL HIGH (ref 11.5–14.5)
WBC: 11.3 10*3/uL — ABNORMAL HIGH (ref 3.8–10.6)

## 2016-06-13 LAB — SAMPLE TO BLOOD BANK

## 2016-06-13 NOTE — Progress Notes (Signed)
Per Dr. Grayland Ormond, MD on call, no blood today with hemoglobin of 8.6.  Put hold tube in for the 06/20/16 appointment.  LJ

## 2016-06-17 ENCOUNTER — Ambulatory Visit: Payer: Commercial Managed Care - HMO

## 2016-06-17 ENCOUNTER — Telehealth: Payer: Self-pay | Admitting: *Deleted

## 2016-06-17 NOTE — Telephone Encounter (Signed)
Called wife and spoke with her about appointment for this Thursday on  At -- in the medical mall for lumbar puncture and that he should be NPO after Midnight, voiced understanding, wife repeated the instructions.

## 2016-06-19 ENCOUNTER — Ambulatory Visit
Admission: RE | Admit: 2016-06-19 | Discharge: 2016-06-19 | Disposition: A | Discharge status: Home / Self Care | Payer: Commercial Managed Care - HMO | Source: Ambulatory Visit | Attending: Hematology and Oncology | Admitting: Hematology and Oncology

## 2016-06-19 ENCOUNTER — Other Ambulatory Visit: Payer: Self-pay | Admitting: Hematology and Oncology

## 2016-06-19 ENCOUNTER — Other Ambulatory Visit: Payer: Self-pay | Admitting: *Deleted

## 2016-06-19 DIAGNOSIS — Z981 Arthrodesis status: Secondary | ICD-10-CM | POA: Diagnosis not present

## 2016-06-19 DIAGNOSIS — Z87891 Personal history of nicotine dependence: Secondary | ICD-10-CM | POA: Insufficient documentation

## 2016-06-19 DIAGNOSIS — E785 Hyperlipidemia, unspecified: Secondary | ICD-10-CM | POA: Diagnosis not present

## 2016-06-19 DIAGNOSIS — I10 Essential (primary) hypertension: Secondary | ICD-10-CM | POA: Insufficient documentation

## 2016-06-19 DIAGNOSIS — N400 Enlarged prostate without lower urinary tract symptoms: Secondary | ICD-10-CM | POA: Diagnosis not present

## 2016-06-19 DIAGNOSIS — Z8249 Family history of ischemic heart disease and other diseases of the circulatory system: Secondary | ICD-10-CM | POA: Diagnosis not present

## 2016-06-19 DIAGNOSIS — C8510 Unspecified B-cell lymphoma, unspecified site: Secondary | ICD-10-CM | POA: Insufficient documentation

## 2016-06-19 DIAGNOSIS — Z833 Family history of diabetes mellitus: Secondary | ICD-10-CM | POA: Insufficient documentation

## 2016-06-19 DIAGNOSIS — C8333 Diffuse large B-cell lymphoma, intra-abdominal lymph nodes: Secondary | ICD-10-CM

## 2016-06-19 DIAGNOSIS — I481 Persistent atrial fibrillation: Secondary | ICD-10-CM | POA: Insufficient documentation

## 2016-06-19 DIAGNOSIS — Z940 Kidney transplant status: Secondary | ICD-10-CM | POA: Diagnosis not present

## 2016-06-19 DIAGNOSIS — C8338 Diffuse large B-cell lymphoma, lymph nodes of multiple sites: Secondary | ICD-10-CM

## 2016-06-19 DIAGNOSIS — Z823 Family history of stroke: Secondary | ICD-10-CM | POA: Insufficient documentation

## 2016-06-19 DIAGNOSIS — K219 Gastro-esophageal reflux disease without esophagitis: Secondary | ICD-10-CM | POA: Diagnosis not present

## 2016-06-19 DIAGNOSIS — I272 Pulmonary hypertension, unspecified: Secondary | ICD-10-CM | POA: Diagnosis not present

## 2016-06-19 LAB — CBC WITH DIFFERENTIAL/PLATELET
Basophils Absolute: 0 10*3/uL (ref 0–0.1)
Basophils Relative: 0 %
Eosinophils Absolute: 0 10*3/uL (ref 0–0.7)
Eosinophils Relative: 0 %
HCT: 28 % — ABNORMAL LOW (ref 40.0–52.0)
Hemoglobin: 9.1 g/dL — ABNORMAL LOW (ref 13.0–18.0)
Lymphocytes Relative: 3 %
Lymphs Abs: 0.2 10*3/uL — ABNORMAL LOW (ref 1.0–3.6)
MCH: 26.8 pg (ref 26.0–34.0)
MCHC: 32.6 g/dL (ref 32.0–36.0)
MCV: 82.2 fL (ref 80.0–100.0)
Monocytes Absolute: 0.4 10*3/uL (ref 0.2–1.0)
Monocytes Relative: 5 %
Neutro Abs: 8 10*3/uL — ABNORMAL HIGH (ref 1.4–6.5)
Neutrophils Relative %: 92 %
Platelets: 143 10*3/uL — ABNORMAL LOW (ref 150–440)
RBC: 3.41 MIL/uL — ABNORMAL LOW (ref 4.40–5.90)
RDW: 24.7 % — ABNORMAL HIGH (ref 11.5–14.5)
WBC: 8.6 10*3/uL (ref 3.8–10.6)

## 2016-06-19 LAB — CSF CELL COUNT WITH DIFFERENTIAL
Eosinophils, CSF: 0 %
Lymphs, CSF: 79 %
Monocyte-Macrophage-Spinal Fluid: 17 %
RBC Count, CSF: 0 /mm3 (ref 0–3)
Segmented Neutrophils-CSF: 4 %
Tube #: 4
WBC, CSF: 7 /mm3 — ABNORMAL HIGH (ref 0–5)

## 2016-06-19 LAB — PROTEIN, CSF: Total  Protein, CSF: 39 mg/dL (ref 15–45)

## 2016-06-19 LAB — APTT: aPTT: 31 seconds (ref 24–36)

## 2016-06-19 LAB — PROTIME-INR
INR: 1.21
Prothrombin Time: 15.4 seconds — ABNORMAL HIGH (ref 11.4–15.2)

## 2016-06-19 LAB — GLUCOSE, CSF: Glucose, CSF: 49 mg/dL (ref 40–70)

## 2016-06-19 NOTE — Procedures (Signed)
Matthew Brown is a 75 yo male who has recent diagnosis of B cell Lymphoma. He is a patient of Dr. Kem Parkinson. Due to difficulty obtaining spinal fluid due to his prior lumbar fusion, I was asked to perform the procedure.  I reviewed the risks and benefits with Matthew Brown.   Procedure:  Matthew Brown was placed in the left latera decubitus position. A time out was performed. The flouroscope was used to identify the L2-3 interspace. 10 ml of local anesthetic was injected. The area was prepped and draped prior to that.  After numbing, a spinal needle was used to access the L2-3 interspace and confirmed with flouroscopy. Clear egress of clear cerebrospinal fluid was noted. The opening pressure was low (<10).  14 ml of clear spinal fluid was collected sterilely and sent for studies per Dr. Mike Gip. The stilette was then reintroduced into the needle and the needle removed. Pressure was held on the exit site, then a bandaid placed.  No immediate complications were noted. All sharps were accounted for.  EBL: minimal IVF: none

## 2016-06-19 NOTE — Consult Note (Signed)
Referring Physician:  Lequita Asal, MD [address] [place], [place] [address]  Primary Physician:  Enid Derry, MD  Chief Complaint:  B cell lymphoma  History of Present Illness: Matthew Brown is a 75 y.o. male who presents with the chief complaint of need for lumbar puncture. He previously had a posterior spinal fusion from L3 to the pelvis with exuberant bone formation. LP was attempted at L4-5 and L5-S1 without success. I was consulted to attempt a different location.    The symptoms are causing a significant impact on the patient's life.   Review of Systems:  A 10 point review of systems is negative, except for the pertinent positives and negatives detailed in the HPI.  Past Medical History: Past Medical History:  Diagnosis Date  . Benign prostatic hypertrophy   . Chronic headache 10/19/2015  . ED (erectile dysfunction)   . End stage renal disease ([place])   . Essential hypertension   . GERD (gastroesophageal reflux disease)   . GIB (gastrointestinal bleeding)    a. [id number] s/p R colic artery embolization;  b. 02/2015 EGD: duod ulcerative mass->Bx notable for coagulative necrosis - ? ischemia vs thrombosis-->coumadin d/c'd.  . Gout   . Hearing loss   . Hemorrhoids   . Hyperlipidemia   . Lymphoma ([place])   . Lymphoma ([place]) 2017  . Multiple thyroid nodules 06/06/2016   Noted on carotid US; dedicated US to be ordered by staff  . Osteoarthrosis, unspecified whether generalized or localized, lower leg   . Persistent atrial fibrillation ([place])    a. CHA2DS2VASc = 3-->coumadin d/c'd 02/2015 2/2 recurrent GIB.  [name] Prostatitis   . Pulmonary hypertension    a. 10/2014 Echo: EF 60-65%, mild to mod MR, mildly dil LA, nl RV, PASP 28mmHg.  [name] Renal transplant recipient   . Ulcers of both great toes [facility])     Past Surgical History: Past Surgical History:  Procedure Laterality Date  . BACK SURGERY    . ESOPHAGOGASTRODUODENOSCOPY  03/13/15   severe esophagitis,  ulcerated mass  . HERNIA REPAIR  1974  . PERIPHERAL VASCULAR CATHETERIZATION N/A 05/07/2016   Procedure: Glori Luis Cath Insertion;  Surgeon: Algernon Huxley, MD;  Location: [place] [facility];  Service: Cardiovascular;  Laterality: N/A;  . PROSTATE ABLATION    . STOMACH SURGERY     blood vessel burst  . THROAT SURGERY    . TOTAL KNEE ARTHROPLASTY      Allergies: Allergies as of 06/19/2016  . (No Known Allergies)    Medications: @ENCMED @  Social History: Social History  Substance Use Topics  . Smoking status: Former Smoker    Packs/day: 1.00    Years: 25.00    Types: Cigarettes    Quit date: 06/23/1978  . Smokeless tobacco: Never Used  . Alcohol use No    Family Medical History: Family History  Problem Relation Age of Onset  . Cancer Mother     throat  . Diabetes Brother   . Heart disease Brother   . Stroke Brother   . Hypertension Brother   . Diabetes Sister   . Heart disease Sister   . Hypertension Sister   . Diabetes Sister   . Diabetes Brother   . COPD Neg Hx   . Kidney disease Neg Hx   . Prostate cancer Neg Hx     Physical Examination: @VITALWITHPAIN @  General: Patient is well developed, well nourished, calm, collected, and in no apparent distress.  Psychiatric: Patient  is non-anxious.  Head:  Pupils equal, round, and reactive to light.  ENT:  Oral mucosa appears well hydrated.  Neck:   Supple.  Full range of motion.  Respiratory: Patient is breathing without any difficulty.  Extremities: No edema.  Vascular: Palpable pulses.  Skin:   On exposed skin, there are no abnormal skin lesions.  NEUROLOGICAL:  General: In no acute distress.   Awake, alert, oriented to person, place, and time.  Pupils equal round and reactive to light.  Facial tone is symmetric.  Tongue protrusion is midline.  There is no pronator drift.     Strength: Side Biceps Triceps Deltoid Interossei Grip Wrist Ext. Wrist Flex.  R 5 5 5 5 5 5 5   L 5 5 5 5 5 5 5    Side  Iliopsoas Quads Hamstring PF DF EHL  R 5 5 5 5 5 5   L 5 5 5 5 5 5      Gait is abnormal requiring a cane.    Imaging: CT lumbar spine shows a window at L2-3 for Lp. L1-2 is also open.  I have personally reviewed the images and agree with the above interpretation.  Assessment and Plan: Matthew Brown is a pleasant 75 y.o. male with b cell lymphoma.  I discussed the challenges associated with his Lp. I offered attempt at L2-3 and L1-2, if he accepted the risks of a possible conus injury at L1-2. He would prefer only to try at L2-3. I discuss the risks and benefits at length with him.  We discussed the riss, benefits, alternatives, and indications of the procedure. The risks discussed include inability to access CSF, injury to nerve roots or the spinal cord, blood clot formation, spinal headache, paralysis, or even death. There are also associated risks of the medications that may be injected by Dr. Mike Gip. She has discussed those with Mr. Pannullo.  Katleen Carraway K. Izora Ribas MD, [facility]

## 2016-06-20 ENCOUNTER — Telehealth: Payer: Self-pay | Admitting: *Deleted

## 2016-06-20 ENCOUNTER — Inpatient Hospital Stay: Payer: Commercial Managed Care - HMO

## 2016-06-20 ENCOUNTER — Other Ambulatory Visit: Payer: Self-pay | Admitting: Hematology and Oncology

## 2016-06-20 ENCOUNTER — Inpatient Hospital Stay (HOSPITAL_BASED_OUTPATIENT_CLINIC_OR_DEPARTMENT_OTHER): Payer: Commercial Managed Care - HMO | Admitting: Hematology and Oncology

## 2016-06-20 ENCOUNTER — Encounter: Payer: Self-pay | Admitting: Hematology and Oncology

## 2016-06-20 VITALS — BP 124/53 | HR 61 | Temp 95.9°F | Resp 18 | Wt 130.3 lb

## 2016-06-20 DIAGNOSIS — M129 Arthropathy, unspecified: Secondary | ICD-10-CM

## 2016-06-20 DIAGNOSIS — M1990 Unspecified osteoarthritis, unspecified site: Secondary | ICD-10-CM

## 2016-06-20 DIAGNOSIS — C7951 Secondary malignant neoplasm of bone: Secondary | ICD-10-CM

## 2016-06-20 DIAGNOSIS — I272 Pulmonary hypertension, unspecified: Secondary | ICD-10-CM

## 2016-06-20 DIAGNOSIS — N529 Male erectile dysfunction, unspecified: Secondary | ICD-10-CM | POA: Diagnosis not present

## 2016-06-20 DIAGNOSIS — N186 End stage renal disease: Secondary | ICD-10-CM

## 2016-06-20 DIAGNOSIS — C8339 Diffuse large B-cell lymphoma, extranodal and solid organ sites: Secondary | ICD-10-CM

## 2016-06-20 DIAGNOSIS — Z8719 Personal history of other diseases of the digestive system: Secondary | ICD-10-CM

## 2016-06-20 DIAGNOSIS — Z79899 Other long term (current) drug therapy: Secondary | ICD-10-CM

## 2016-06-20 DIAGNOSIS — K219 Gastro-esophageal reflux disease without esophagitis: Secondary | ICD-10-CM

## 2016-06-20 DIAGNOSIS — C787 Secondary malignant neoplasm of liver and intrahepatic bile duct: Secondary | ICD-10-CM

## 2016-06-20 DIAGNOSIS — I129 Hypertensive chronic kidney disease with stage 1 through stage 4 chronic kidney disease, or unspecified chronic kidney disease: Secondary | ICD-10-CM

## 2016-06-20 DIAGNOSIS — Z5111 Encounter for antineoplastic chemotherapy: Secondary | ICD-10-CM

## 2016-06-20 DIAGNOSIS — Z808 Family history of malignant neoplasm of other organs or systems: Secondary | ICD-10-CM

## 2016-06-20 DIAGNOSIS — E041 Nontoxic single thyroid nodule: Secondary | ICD-10-CM

## 2016-06-20 DIAGNOSIS — Z5112 Encounter for antineoplastic immunotherapy: Secondary | ICD-10-CM | POA: Diagnosis not present

## 2016-06-20 DIAGNOSIS — Z940 Kidney transplant status: Secondary | ICD-10-CM

## 2016-06-20 DIAGNOSIS — Z7952 Long term (current) use of systemic steroids: Secondary | ICD-10-CM

## 2016-06-20 DIAGNOSIS — C8333 Diffuse large B-cell lymphoma, intra-abdominal lymph nodes: Secondary | ICD-10-CM

## 2016-06-20 DIAGNOSIS — C8338 Diffuse large B-cell lymphoma, lymph nodes of multiple sites: Secondary | ICD-10-CM | POA: Diagnosis not present

## 2016-06-20 DIAGNOSIS — N400 Enlarged prostate without lower urinary tract symptoms: Secondary | ICD-10-CM

## 2016-06-20 DIAGNOSIS — E785 Hyperlipidemia, unspecified: Secondary | ICD-10-CM

## 2016-06-20 DIAGNOSIS — Z87891 Personal history of nicotine dependence: Secondary | ICD-10-CM

## 2016-06-20 DIAGNOSIS — I4891 Unspecified atrial fibrillation: Secondary | ICD-10-CM

## 2016-06-20 DIAGNOSIS — C801 Malignant (primary) neoplasm, unspecified: Secondary | ICD-10-CM

## 2016-06-20 LAB — CBC WITH DIFFERENTIAL/PLATELET
Basophils Absolute: 0 10*3/uL (ref 0–0.1)
Basophils Relative: 1 %
Eosinophils Absolute: 0 10*3/uL (ref 0–0.7)
Eosinophils Relative: 0 %
HCT: 29.4 % — ABNORMAL LOW (ref 40.0–52.0)
Hemoglobin: 9.6 g/dL — ABNORMAL LOW (ref 13.0–18.0)
Lymphocytes Relative: 6 %
Lymphs Abs: 0.5 10*3/uL — ABNORMAL LOW (ref 1.0–3.6)
MCH: 27.2 pg (ref 26.0–34.0)
MCHC: 32.8 g/dL (ref 32.0–36.0)
MCV: 82.9 fL (ref 80.0–100.0)
Monocytes Absolute: 0.6 10*3/uL (ref 0.2–1.0)
Monocytes Relative: 8 %
Neutro Abs: 6.4 10*3/uL (ref 1.4–6.5)
Neutrophils Relative %: 85 %
Platelets: 135 10*3/uL — ABNORMAL LOW (ref 150–440)
RBC: 3.54 MIL/uL — ABNORMAL LOW (ref 4.40–5.90)
RDW: 24.9 % — ABNORMAL HIGH (ref 11.5–14.5)
WBC: 7.5 10*3/uL (ref 3.8–10.6)

## 2016-06-20 LAB — COMPREHENSIVE METABOLIC PANEL
ALT: 10 U/L — ABNORMAL LOW (ref 17–63)
AST: 24 U/L (ref 15–41)
Albumin: 3.5 g/dL (ref 3.5–5.0)
Alkaline Phosphatase: 98 U/L (ref 38–126)
Anion gap: 6 (ref 5–15)
BUN: 17 mg/dL (ref 6–20)
CO2: 22 mmol/L (ref 22–32)
Calcium: 9.3 mg/dL (ref 8.9–10.3)
Chloride: 108 mmol/L (ref 101–111)
Creatinine, Ser: 0.99 mg/dL (ref 0.61–1.24)
GFR calc Af Amer: >60 mL/min (ref >60)
GFR calc non Af Amer: >60 mL/min (ref >60)
Glucose, Bld: 149 mg/dL — ABNORMAL HIGH (ref 65–99)
Potassium: 3.5 mmol/L (ref 3.5–5.1)
Sodium: 136 mmol/L (ref 135–145)
Total Bilirubin: 0.5 mg/dL (ref 0.3–1.2)
Total Protein: 5.8 g/dL — ABNORMAL LOW (ref 6.5–8.1)

## 2016-06-20 LAB — LACTATE DEHYDROGENASE: LDH: 137 U/L (ref 98–192)

## 2016-06-20 LAB — SAMPLE TO BLOOD BANK

## 2016-06-20 LAB — URIC ACID: Uric Acid, Serum: 5.2 mg/dL (ref 4.4–7.6)

## 2016-06-20 MED ORDER — SODIUM CHLORIDE 0.9 % IV SOLN
400.0000 mg/m2 | Freq: Once | INTRAVENOUS | Status: AC
  Administered 2016-06-20: 660 mg via INTRAVENOUS
  Filled 2016-06-20: qty 33

## 2016-06-20 MED ORDER — DEXAMETHASONE SODIUM PHOSPHATE 10 MG/ML IJ SOLN
10.0000 mg | Freq: Once | INTRAMUSCULAR | Status: AC
  Administered 2016-06-20: 10 mg via INTRAVENOUS
  Filled 2016-06-20: qty 1

## 2016-06-20 MED ORDER — HEPARIN SOD (PORK) LOCK FLUSH 100 UNIT/ML IV SOLN
500.0000 [IU] | Freq: Once | INTRAVENOUS | Status: AC | PRN
  Administered 2016-06-20: 500 [IU]
  Filled 2016-06-20: qty 5

## 2016-06-20 MED ORDER — SODIUM CHLORIDE 0.9 % IV SOLN: 10.0000 mg | Freq: Once | INTRAVENOUS | Status: DC

## 2016-06-20 MED ORDER — DIPHENHYDRAMINE HCL 25 MG PO CAPS
50.0000 mg | ORAL_CAPSULE | Freq: Once | ORAL | Status: AC
  Administered 2016-06-20: 50 mg via ORAL
  Filled 2016-06-20: qty 2

## 2016-06-20 MED ORDER — ACETAMINOPHEN 325 MG PO TABS
650.0000 mg | ORAL_TABLET | Freq: Once | ORAL | Status: AC
  Administered 2016-06-20: 650 mg via ORAL
  Filled 2016-06-20: qty 2

## 2016-06-20 MED ORDER — SODIUM CHLORIDE 0.9 % IV SOLN: 375.0000 mg/m2 | Freq: Once | INTRAVENOUS | Status: DC

## 2016-06-20 MED ORDER — SODIUM CHLORIDE 0.9 % IV SOLN
375.0000 mg/m2 | Freq: Once | INTRAVENOUS | Status: AC
  Administered 2016-06-20: 600 mg via INTRAVENOUS
  Filled 2016-06-20: qty 50

## 2016-06-20 MED ORDER — PEGFILGRASTIM 6 MG/0.6ML ~~LOC~~ PSKT
6.0000 mg | PREFILLED_SYRINGE | Freq: Once | SUBCUTANEOUS | Status: AC
  Administered 2016-06-20: 6 mg via SUBCUTANEOUS
  Filled 2016-06-20: qty 0.6

## 2016-06-20 MED ORDER — VINCRISTINE SULFATE CHEMO INJECTION 1 MG/ML
1.0000 mg | Freq: Once | INTRAVENOUS | Status: AC
  Administered 2016-06-20: 1 mg via INTRAVENOUS
  Filled 2016-06-20: qty 1

## 2016-06-20 MED ORDER — SODIUM CHLORIDE 0.9% FLUSH
10.0000 mL | INTRAVENOUS | Status: DC | PRN
  Administered 2016-06-20: 10 mL
  Filled 2016-06-20: qty 10

## 2016-06-20 MED ORDER — SODIUM CHLORIDE 0.9 % IV SOLN
Freq: Once | INTRAVENOUS | Status: AC
  Administered 2016-06-20: 10:00:00 via INTRAVENOUS
  Filled 2016-06-20: qty 1000

## 2016-06-20 MED ORDER — DOXORUBICIN HCL CHEMO IV INJECTION 2 MG/ML
25.0000 mg/m2 | Freq: Once | INTRAVENOUS | Status: AC
  Administered 2016-06-20: 42 mg via INTRAVENOUS
  Filled 2016-06-20: qty 21

## 2016-06-20 MED ORDER — PALONOSETRON HCL INJECTION 0.25 MG/5ML
0.2500 mg | Freq: Once | INTRAVENOUS | Status: AC
  Administered 2016-06-20: 0.25 mg via INTRAVENOUS
  Filled 2016-06-20: qty 5

## 2016-06-20 NOTE — Progress Notes (Signed)
[facility] day:  06/20/2016   Chief Complaint: Matthew Brown is a 75 y.o. male with post-transplant lymphoproliferative disorder, stage IVBE diffuse large B cell lymphoma, who is seen for assessment prior to cycle #3 mini-RCHOP.  HPI:  The patient was last seen in the medical oncology clinic on 06/09/2016.  At that time, he received Xgeva for multiple bone metastasis as well as hypercalcemia.  Counts on 06/13/2016 revealed a hematocrit of 26.7, hemoglobin 8.6, MCV 81.1, platelets 128,000, WBC 11,300 with an ANC of 10,100.  Because of difficulty obtaining an LP for IT MTX, he underwent lumbar spine CT on 06/13/2016.  There was relative heterogeneity in the L4 and L5 vertebral bodies concerning for malignancy.  There was a large left periaortic soft tissue mass likely reflecting abnormal lymphoid tissue measuring approximately 5.3 x 4 cm in transverse dimension consistent with patient's history of lymphoma.  There was solid osseous fusion of the posterior elements from L3 through S1 with cerclage wires in place. From L3 through S1 there was solid osseous fusion with no interspinous spaces to perform a lumbar puncture.   He underwent fluoro guided LP on 06/19/2016 by Dr. Izora Ribas of neurosurgery.  The L2-L3 interspace was accessed.  14 ml of clear CSF was obtained.  Protein was 39.  Glucose was 49.    There were no RBCs and 7 WBCs with 4% segs, 79% lymphs, and 17% monocytes.   Symptomatically, he feels good.  He notes that his appetite is coming back.  He is scheduling a follow-up appointment with his nephrologist.   Past Medical History:  Diagnosis Date  . Benign prostatic hypertrophy   . Chronic headache 10/19/2015  . ED (erectile dysfunction)   . End stage renal disease ([place])   . Essential hypertension   . GERD (gastroesophageal reflux disease)   . GIB (gastrointestinal bleeding)    a. 06/8561 s/p R colic artery embolization;  b. 02/2015 EGD: duod  ulcerative mass->Bx notable for coagulative necrosis - ? ischemia vs thrombosis-->coumadin d/c'd.  . Gout   . Hearing loss   . Hemorrhoids   . Hyperlipidemia   . Lymphoma ([place])   . Lymphoma ([place]) 2017  . Multiple thyroid nodules 06/06/2016   Noted on carotid US; dedicated US to be ordered by staff  . Osteoarthrosis, unspecified whether generalized or localized, lower leg   . Persistent atrial fibrillation ([place])    a. CHA2DS2VASc = 3-->coumadin d/c'd 02/2015 2/2 recurrent GIB.  [name] Prostatitis   . Pulmonary hypertension    a. 10/2014 Echo: EF 60-65%, mild to mod MR, mildly dil LA, nl RV, PASP 58mHg.  .Marland KitchenRenal transplant recipient   . Ulcers of both great toes ([facility]     Past Surgical History:  Procedure Laterality Date  . BACK SURGERY    . ESOPHAGOGASTRODUODENOSCOPY  03/13/15   severe esophagitis, ulcerated mass  . HERNIA REPAIR  1974  . PERIPHERAL VASCULAR CATHETERIZATION N/A 05/07/2016   Procedure: PGlori LuisCath Insertion;  Surgeon: JAlgernon Huxley MD;  [facility];  Service: Cardiovascular;  Laterality: N/A;  . PROSTATE ABLATION    . STOMACH SURGERY     blood vessel burst  . THROAT SURGERY    . TOTAL KNEE ARTHROPLASTY      Family History  Problem Relation Age of Onset  . Cancer Mother     throat  . Diabetes Brother   . Heart disease Brother   . Stroke Brother   .  Hypertension Brother   . Diabetes Sister   . Heart disease Sister   . Hypertension Sister   . Diabetes Sister   . Diabetes Brother   . COPD Neg Hx   . Kidney disease Neg Hx   . Prostate cancer Neg Hx     Social History:  reports that he quit smoking about 38 years ago. His smoking use included Cigarettes. He has a 25.00 pack-year smoking history. He has never used smokeless tobacco. He reports that he does not drink alcohol or use drugs.  He stopped smoking in 1981.  He smoked 3 cigarettes/day.  He lives in [place].  The patient is accompanied by his wife, [name],  today.  Allergies: No Known  Allergies  Current Medications: Current Outpatient Prescriptions  Medication Sig Dispense Refill  . acetaminophen (TYLENOL) 325 MG tablet Take 2 tablets (650 mg total) by mouth every 6 (six) hours as needed for mild pain (or Fever >/= 101).    [name] albuterol (PROAIR HFA) 108 (90 BASE) MCG/ACT inhaler Inhale 1-2 puffs into the lungs every 4 (four) hours as needed.     [name] allopurinol (ZYLOPRIM) 100 MG tablet Take 100 mg by mouth daily.      [name] COLCRYS 0.6 MG tablet Take 1 tablet by mouth 2 (two) times daily as needed.    . diphenhydrAMINE (BENADRYL) 25 mg capsule Take 1 capsule (25 mg total) by mouth at bedtime as needed for sleep. 30 capsule 0  . docusate sodium (COLACE) 100 MG capsule Take 1 capsule (100 mg total) by mouth 2 (two) times daily. 10 capsule 0  . feeding supplement (BOOST / RESOURCE BREEZE) LIQD Take 1 Container by mouth 3 (three) times daily between meals. 90 Container 0  . feeding supplement, ENSURE ENLIVE, (ENSURE ENLIVE) LIQD Take 237 mLs by mouth 3 (three) times daily between meals. 90 Bottle 0  . finasteride (PROSCAR) 5 MG tablet Take 5 mg by mouth daily.      . furosemide (LASIX) 80 MG tablet Take 1 tablet by mouth daily as needed.    [name] HYDROcodone-acetaminophen (NORCO) 5-325 MG tablet Take 1 tablet by mouth every 4 (four) hours as needed for moderate pain. 30 tablet 0  . hydroxypropyl methylcellulose (ISOPTO TEARS) 2.5 % ophthalmic solution Place 1 drop into both eyes as needed.     . lactulose (CHRONULAC) 10 GM/15ML solution Take 7.5-15 mLs (5-10 g total) by mouth daily as needed for mild constipation. 473 mL 1  . Multiple Vitamin (MULTIVITAMIN) tablet Take 1 tablet by mouth daily.      . ondansetron (ZOFRAN) 4 MG tablet Take 1 tablet (4 mg total) by mouth every 6 (six) hours as needed for nausea. 20 tablet 0  . oxybutynin (DITROPAN-XL) 5 MG 24 hr tablet Take 5 mg by mouth daily.    [name] oxyCODONE-acetaminophen (ROXICET) 5-325 MG tablet Take 0.5-1 tablets by mouth every 6 (six)  hours as needed for severe pain. 30 tablet 0  . pantoprazole (PROTONIX) 40 MG tablet Take 1 tablet (40 mg total) by mouth 2 (two) times daily. 60 tablet 0  . polyethylene glycol (MIRALAX / GLYCOLAX) packet Take 17 g by mouth daily as needed (constipation). 14 each 0  . predniSONE (DELTASONE) 20 MG tablet Take 70 mg (3.5 pills) daily for 5 days after each cycle of chemotherapy. 45 tablet 1  . predniSONE (DELTASONE) 5 MG tablet Take 5 mg by mouth daily.     . prochlorperazine (COMPAZINE) 10 MG tablet Take 1 tablet  by mouth daily as needed.    . simethicone (MYLICON) 80 MG chewable tablet Chew 1 tablet (80 mg total) by mouth every 6 (six) hours as needed for flatulence. 30 tablet 0  . sucralfate (CARAFATE) 1 g tablet Take 1 tablet (1 g total) by mouth 4 (four) times daily. Resume taking after one week- once finished taking oral levaquine. ( to avoid interaction.) 40 tablet 3  . tacrolimus (PROGRAF) 1 MG capsule Take 3 mg by mouth 2 (two) times daily. Reported on 08/16/2015    . Tamsulosin HCl (FLOMAX) 0.4 MG CAPS Take 0.4 mg by mouth daily.       No current facility-administered medications for this visit.    Facility-Administered Medications Ordered in Other Visits  Medication Dose Route Frequency Provider Last Rate Last Dose  . methotrexate (PF) 12 mg in sodium chloride 0.9 % INTRATHECAL chemo injection   Intrathecal Once Lequita Asal, MD        Review of Systems:  GENERAL:  Feels good.  No fevers or sweats.  Weight gain of 2 pounds. PERFORMANCE STATUS (ECOG):  1 HEENT:  No visual changes, sore throat, mouth sores or tenderness. Lungs: No shortness of breath or cough.  No hemoptysis. Cardiac:  No chest pain, palpitations, orthopnea, or PND. GI:  Appetite improving.  No nausea, vomiting, diarrhea, constipation, melena or hematochezia. GU:  Enlarged prostate.  No urgency, frequency, dysuria, or hematuria. Musculoskeletal:  Back pain and left hip pain.  No joint pain.  No muscle  tenderness. Extremities:  No pain or swelling. Skin:  No rashes or skin changes. Neuro:  Chronic headaches.  No numbness or weakness, balance or coordination issues. Endocrine:  No diabetes, thyroid issues, hot flashes or night sweats. Psych:  No mood changes, depression or anxiety. Pain:  Back pain. Review of systems:  All other systems reviewed and found to be negative.  Physical Exam: Blood pressure (!) 124/53, pulse 61, temperature (!) 95.9 F (35.5 C), temperature source Tympanic, resp. rate 18, weight 130 lb 5 oz (59.1 kg). GENERAL:  Thin elderly gentleman sitting in the exam room in no acute distress. MENTAL STATUS:  Alert and oriented to person, place and time. HEAD:  Lu [place]el.  Normocephalic, atraumatic, face symmetric, no Cushingoid features. EYES:  Glasses.  Brown eyes.  Pupils equal round and reactive to light and accomodation.  No conjunctivitis or scleral icterus. ENT:  Oropharynx clear without lesion.  Edentulous.  Tongue normal. Mucous membranes dry.  RESPIRATORY:  Clear to auscultation without rales, wheezes or rhonchi. CARDIOVASCULAR:  Regular rate and rhythm without murmur, rub or gallop. ABDOMEN:  Soft, non-tender, with active bowel sounds, and no hepatosplenomegaly.  No masses.   SKIN:  No rashes, ulcers or lesions. EXTREMITIES:  No edema, no skin discoloration or tenderness.  No palpable cords. NEUROLOGICAL: Unremarkable. PSYCH:  Appropriate.  Bright affect.   Appointment on 06/20/2016  Component Date Value Ref Range Status  . WBC 06/20/2016 7.5  3.8 - 10.6 K/uL Final  . RBC 06/20/2016 3.54* 4.40 - 5.90 MIL/uL Final  . Hemoglobin 06/20/2016 9.6* 13.0 - 18.0 g/dL Final  . HCT 06/20/2016 29.4* 40.0 - 52.0 % Final  . MCV 06/20/2016 82.9  80.0 - 100.0 fL Final  . MCH 06/20/2016 27.2  26.0 - 34.0 pg Final  . MCHC 06/20/2016 32.8  32.0 - 36.0 g/dL Final  . RDW 06/20/2016 24.9* 11.5 - 14.5 % Final  . Platelets 06/20/2016 135* 150 - 440 K/uL Final  . Neutrophils  Relative % 06/20/2016 85  % Final  . Neutro Abs 06/20/2016 6.4  1.4 - 6.5 K/uL Final  . Lymphocytes Relative 06/20/2016 6  % Final  . Lymphs Abs 06/20/2016 0.5* 1.0 - 3.6 K/uL Final  . Monocytes Relative 06/20/2016 8  % Final  . Monocytes Absolute 06/20/2016 0.6  0.2 - 1.0 K/uL Final  . Eosinophils Relative 06/20/2016 0  % Final  . Eosinophils Absolute 06/20/2016 0.0  0 - 0.7 K/uL Final  . Basophils Relative 06/20/2016 1  % Final  . Basophils Absolute 06/20/2016 0.0  0 - 0.1 K/uL Final  . Sodium 06/20/2016 136  135 - 145 mmol/L Final  . Potassium 06/20/2016 3.5  3.5 - 5.1 mmol/L Final  . Chloride 06/20/2016 108  101 - 111 mmol/L Final  . CO2 06/20/2016 22  22 - 32 mmol/L Final  . Glucose, Bld 06/20/2016 149* 65 - 99 mg/dL Final  . BUN 06/20/2016 17  6 - 20 mg/dL Final  . Creatinine, Ser 06/20/2016 0.99  0.61 - 1.24 mg/dL Final  . Calcium 06/20/2016 9.3  8.9 - 10.3 mg/dL Final  . Total Protein 06/20/2016 5.8* 6.5 - 8.1 g/dL Final  . Albumin 06/20/2016 3.5  3.5 - 5.0 g/dL Final  . AST 06/20/2016 24  15 - 41 U/L Final  . ALT 06/20/2016 10* 17 - 63 U/L Final  . Alkaline Phosphatase 06/20/2016 98  38 - 126 U/L Final  . Total Bilirubin 06/20/2016 0.5  0.3 - 1.2 mg/dL Final  . GFR calc non Af Amer 06/20/2016 >60  >60 mL/min Final  . GFR calc Af Amer 06/20/2016 >60  >60 mL/min Final   Comment: (NOTE) The eGFR has been calculated using the CKD EPI equation. This calculation has not been validated in all clinical situations. eGFR's persistently <60 mL/min signify possible Chronic Kidney Disease.   . Anion gap 06/20/2016 6  5 - 15 Final  . LDH 06/20/2016 137  98 - 192 U/L Final  Hospital Outpatient Visit on 06/19/2016  Component Date Value Ref Range Status  . aPTT 06/19/2016 31  24 - 36 seconds Final  . Prothrombin Time 06/19/2016 15.4* 11.4 - 15.2 seconds Final  . INR 06/19/2016 1.21   Final  . WBC 06/19/2016 8.6  3.8 - 10.6 K/uL Final  . RBC 06/19/2016 3.41* 4.40 - 5.90 MIL/uL Final   . Hemoglobin 06/19/2016 9.1* 13.0 - 18.0 g/dL Final  . HCT 06/19/2016 28.0* 40.0 - 52.0 % Final  . MCV 06/19/2016 82.2  80.0 - 100.0 fL Final  . MCH 06/19/2016 26.8  26.0 - 34.0 pg Final  . MCHC 06/19/2016 32.6  32.0 - 36.0 g/dL Final  . RDW 06/19/2016 24.7* 11.5 - 14.5 % Final  . Platelets 06/19/2016 143* 150 - 440 K/uL Final  . Neutrophils Relative % 06/19/2016 92  % Final  . Neutro Abs 06/19/2016 8.0* 1.4 - 6.5 K/uL Final  . Lymphocytes Relative 06/19/2016 3  % Final  . Lymphs Abs 06/19/2016 0.2* 1.0 - 3.6 K/uL Final  . Monocytes Relative 06/19/2016 5  % Final  . Monocytes Absolute 06/19/2016 0.4  0.2 - 1.0 K/uL Final  . Eosinophils Relative 06/19/2016 0  % Final  . Eosinophils Absolute 06/19/2016 0.0  0 - 0.7 K/uL Final  . Basophils Relative 06/19/2016 0  % Final  . Basophils Absolute 06/19/2016 0.0  0 - 0.1 K/uL Final  . Glucose, CSF 06/19/2016 49  40 - 70 mg/dL Final  . Total  Protein, CSF 06/19/2016  39  15 - 45 mg/dL Final  . Tube # 06/19/2016 4   Final  . Color, CSF 06/19/2016 COLORLESS  COLORLESS Final  . Appearance, CSF 06/19/2016 CLEAR* CLEAR Final  . RBC Count, CSF 06/19/2016 0  0 - 3 /cu mm Final  . WBC, CSF 06/19/2016 7* 0 - 5 /cu mm Final  . Segmented Neutrophils-CSF 06/19/2016 4  % Final  . Lymphs, CSF 06/19/2016 79  % Final  . Monocyte-Macrophage-Spinal Fluid 06/19/2016 17  % Final  . Eosinophils, CSF 06/19/2016 0  % Final  . Specimen Description 06/19/2016 CSF   Final  . Special Requests 06/19/2016 NONE   Final  . Gram Stain 06/19/2016    Final                   Value:NO WBC SEEN NO RED BLOOD CELLS NO ORGANISMS SEEN   . Culture 06/19/2016 PENDING   Incomplete  . Report Status 06/19/2016 PENDING   Incomplete    Assessment:  Matthew Brown is a 75 y.o. male s/p renal transplant (2007) with a post-transplant lymphoproliferative disorder, stage IV diffuse large B cell lymphoma.  He presented with a 2-3 month history of progressive back pain superimposed on chronic  back pain.    PET scan on 04/28/2016 revealed bulky intensely hypermetabolic periaortic upper abdominal and mesenteric adenopathy concerning for high-grade lymphoma.  There was hypermetabolic liver metastasis.  There was hpermetabolic lesion involving the small bowel of the upper pelvis.  There were multiple sites of hypermetabolic skeletal metastasis. There was moderate volume right pneumothorax.  CT guided retroperitoneal node biopsy on 04/30/2016 revealed diffuse large B cell lymphoma.  Hepatitis B and C testing on 05/06/2016 were negative.  Echo on 05/06/2016 revealed an EF of 55-60%.  Bone marrow aspirate and biopsy on 05/08/2016 revealed multifocal marrow involvement by diffuse large B-cell lymphoma. There was variably cellular marrow for age (50% - 90%) with a patchy predominantly nodular large B-cell infiltrate, overall estimated to account for 20% of the core biopsy. There was adequate residual trilineage hematopoiesis with mild nonspecific dyserythropoiesis. There was patchy mild increase in reticulin. Storage iron was present. The immunohistochemical staining pattern of the B-cell infiltrate (CD10 +/-, BCL 6+, BCL-2 +) suggested possible large cell transformation of follicular lymphoma.  Flow cytometry revealed no significant immunophenotypic abnormalities or evidence of B-cell lymphoma.  He has bone metastasis and hypercalcemia.  Calcium was 11.2 (ionized 7.3) on 05/13/2016.  He received Zometa on 05/13/2016.  He received Xgeva on 06/09/2016.  Work-up on 04/15/2016 revealed the following normal studies: ferritin (343), iron saturation (6%), TIBC (241; low), B12 (525), folate (27).  Reticulocyte count was 2%.  LDH was 409.  Uric acid was 7.7 (4.4 - 7.6).  He was admitted at [facility] from 04/28/2016 - 04/30/2016 with a moderate volume right sided pneumothorax.  His pneumothorax improved spontaneously. Plain films of the right femur revealed the lucent bone lesion in the right femoral neck  was  poorly characterized.  There was no evidence for an acute fracture.  PTH was 106 (high) 04/30/2016 with a calcium of 11.2.  Etiology was c/w primary hyperparathyroidism.  PTH-related polypeptide was < 1.1 on 04/30/2016.  He has a history of renal failure s/p renal transplant.  He is tacrolimus (Prograf), and steroids  Creatinine has ranged between 1.43 - 1.93 in the past 6 months. Mycophenolate (MMF) was discontinued at diagnosis.  Prograf level was 5.5 (3.0-8.0) on 05/08/2016.   He is s/p 2 cycles of mini-RCHOP (  05/09/2016 - 05/30/2016).  Cycle #1 was complicated by fever and neutropenia.  All cultures were negative.  He was treated empirically with Cefepime.  Peripheral smear revealed schistocytes.  Symptomatically, he is feeling better.  He has gained 2 pounds.  Plan: 1.  Labs today:  CBC with diff, CMP, LDH, uric acid. 2.  Cycle #3 mini-RCHOP today with OnPro Neulasta 3.  RTC on 07/07/2016 for labs (CBC with diff, BMP, hold tube) and Xgeva. 4.  Schedule LP with IT MTX on 07/10/2016 at 8AM- coordinate with Dr Cari Caraway, neurosurgeon, and radiology 5.  Labs on 07/10/2016: CBC with diff, CMP, PT, PTT, LDH, uric acid. 6.  RTC on 07/11/2016 for MD assessment and cycle #4 mini-RCHOP.   Lequita Asal, MD  06/20/2016, 9:23 AM

## 2016-06-20 NOTE — Telephone Encounter (Signed)
Called Dr. Cari Caraway to coordinate LP with MTX on 07-10-16.  Dr. Cari Caraway not available but Dr. Rogue Jury Abd-el-bar will be able to perform the procedure.

## 2016-06-20 NOTE — Progress Notes (Signed)
Patient states when he wakes up in the mornings he has a headache with pressure between his eyes.  Also is requesting a prescription for Mylicon 80 mg chewable.  States he was given a prescription when he was in the hospital but is out and has no refills.

## 2016-06-22 LAB — CSF CULTURE W GRAM STAIN
Culture: NO GROWTH
Gram Stain: NONE SEEN

## 2016-06-24 ENCOUNTER — Telehealth: Payer: Self-pay | Admitting: *Deleted

## 2016-06-24 NOTE — Telephone Encounter (Signed)
  Sound like his GI upset from diarrhea.  Anyone else ill regarding recent food eaten?  I would manage the diarrhea.  Consider obtaining a stool sample for C diff if diarrhea persists, feels ill, or has a fever.  Does he feel like he needs to be seen?  M

## 2016-06-24 NOTE — Telephone Encounter (Signed)
No one else is sick. What do you want to manage diarrhea with ? Imodium? Did not say he wanted to be seen, just wants his stomach to stop hurting

## 2016-06-24 NOTE — Telephone Encounter (Signed)
Called wife and she reports that she will keep giving him immodium as ordered and increase po fluids. Patient denies needing to be seen or other symptoms such as fever or weakness at this time.  Wife instructed to call cancer center if he develops a fever, has other symptoms or if the diarrhea does not subside in 24 hours, voiced understanding.

## 2016-06-24 NOTE — Telephone Encounter (Signed)
Called wife and she reports that she will keep giving him immodium as ordered and increase po fluids. Patient denies needing to be seen or other symptoms such as fever or weakness at this time.  Wife instructed to call cancer center if he develops a fever, has other symptoms or if the diarrhea does not subside in 24 hours, voiced understanding

## 2016-06-24 NOTE — Telephone Encounter (Signed)
Called reporting that he has had a stomach ache since Saturday. It started out with gas and somewhat constipation, she gave him 2 gas pills yesterday and last evening he developed diarrhea going 3 times last night and has been 3 times today; she gave him 2 imodium. States his stomach "just hurts" not stabbing pain. Please advise

## 2016-06-25 ENCOUNTER — Encounter: Payer: Self-pay | Admitting: *Deleted

## 2016-06-25 ENCOUNTER — Telehealth: Payer: Self-pay | Admitting: *Deleted

## 2016-06-25 ENCOUNTER — Other Ambulatory Visit: Payer: Self-pay | Admitting: Hematology and Oncology

## 2016-06-25 ENCOUNTER — Emergency Department: Payer: Medicare HMO

## 2016-06-25 ENCOUNTER — Other Ambulatory Visit: Payer: Self-pay | Admitting: *Deleted

## 2016-06-25 ENCOUNTER — Observation Stay
Admission: EM | Admit: 2016-06-25 | Discharge: 2016-06-28 | Disposition: A | Discharge status: Home / Self Care | Payer: Medicare HMO | Attending: Internal Medicine | Admitting: Internal Medicine

## 2016-06-25 DIAGNOSIS — K219 Gastro-esophageal reflux disease without esophagitis: Secondary | ICD-10-CM | POA: Insufficient documentation

## 2016-06-25 DIAGNOSIS — N186 End stage renal disease: Secondary | ICD-10-CM | POA: Diagnosis not present

## 2016-06-25 DIAGNOSIS — N400 Enlarged prostate without lower urinary tract symptoms: Secondary | ICD-10-CM | POA: Insufficient documentation

## 2016-06-25 DIAGNOSIS — I481 Persistent atrial fibrillation: Secondary | ICD-10-CM | POA: Diagnosis not present

## 2016-06-25 DIAGNOSIS — R590 Localized enlarged lymph nodes: Secondary | ICD-10-CM | POA: Insufficient documentation

## 2016-06-25 DIAGNOSIS — G8929 Other chronic pain: Secondary | ICD-10-CM | POA: Diagnosis not present

## 2016-06-25 DIAGNOSIS — C8330 Diffuse large B-cell lymphoma, unspecified site: Secondary | ICD-10-CM | POA: Diagnosis not present

## 2016-06-25 DIAGNOSIS — M160 Bilateral primary osteoarthritis of hip: Secondary | ICD-10-CM | POA: Insufficient documentation

## 2016-06-25 DIAGNOSIS — E883 Tumor lysis syndrome: Secondary | ICD-10-CM | POA: Diagnosis not present

## 2016-06-25 DIAGNOSIS — Z681 Body mass index (BMI) 19 or less, adult: Secondary | ICD-10-CM | POA: Insufficient documentation

## 2016-06-25 DIAGNOSIS — Z9221 Personal history of antineoplastic chemotherapy: Secondary | ICD-10-CM | POA: Insufficient documentation

## 2016-06-25 DIAGNOSIS — D72829 Elevated white blood cell count, unspecified: Secondary | ICD-10-CM | POA: Diagnosis not present

## 2016-06-25 DIAGNOSIS — E042 Nontoxic multinodular goiter: Secondary | ICD-10-CM | POA: Insufficient documentation

## 2016-06-25 DIAGNOSIS — R739 Hyperglycemia, unspecified: Secondary | ICD-10-CM | POA: Diagnosis not present

## 2016-06-25 DIAGNOSIS — C7951 Secondary malignant neoplasm of bone: Secondary | ICD-10-CM | POA: Insufficient documentation

## 2016-06-25 DIAGNOSIS — C8333 Diffuse large B-cell lymphoma, intra-abdominal lymph nodes: Secondary | ICD-10-CM

## 2016-06-25 DIAGNOSIS — K529 Noninfective gastroenteritis and colitis, unspecified: Secondary | ICD-10-CM | POA: Diagnosis present

## 2016-06-25 DIAGNOSIS — Z87891 Personal history of nicotine dependence: Secondary | ICD-10-CM | POA: Insufficient documentation

## 2016-06-25 DIAGNOSIS — I120 Hypertensive chronic kidney disease with stage 5 chronic kidney disease or end stage renal disease: Secondary | ICD-10-CM | POA: Diagnosis not present

## 2016-06-25 DIAGNOSIS — I272 Pulmonary hypertension, unspecified: Secondary | ICD-10-CM | POA: Insufficient documentation

## 2016-06-25 DIAGNOSIS — Z8719 Personal history of other diseases of the digestive system: Secondary | ICD-10-CM | POA: Insufficient documentation

## 2016-06-25 DIAGNOSIS — C787 Secondary malignant neoplasm of liver and intrahepatic bile duct: Secondary | ICD-10-CM | POA: Insufficient documentation

## 2016-06-25 DIAGNOSIS — D6959 Other secondary thrombocytopenia: Secondary | ICD-10-CM | POA: Diagnosis not present

## 2016-06-25 DIAGNOSIS — N281 Cyst of kidney, acquired: Secondary | ICD-10-CM | POA: Diagnosis not present

## 2016-06-25 DIAGNOSIS — N529 Male erectile dysfunction, unspecified: Secondary | ICD-10-CM | POA: Diagnosis not present

## 2016-06-25 DIAGNOSIS — I700 Atherosclerosis of aorta: Secondary | ICD-10-CM | POA: Diagnosis not present

## 2016-06-25 DIAGNOSIS — E43 Unspecified severe protein-calorie malnutrition: Secondary | ICD-10-CM | POA: Insufficient documentation

## 2016-06-25 DIAGNOSIS — E785 Hyperlipidemia, unspecified: Secondary | ICD-10-CM | POA: Diagnosis not present

## 2016-06-25 DIAGNOSIS — M109 Gout, unspecified: Secondary | ICD-10-CM | POA: Diagnosis not present

## 2016-06-25 DIAGNOSIS — F5104 Psychophysiologic insomnia: Secondary | ICD-10-CM | POA: Insufficient documentation

## 2016-06-25 DIAGNOSIS — Z940 Kidney transplant status: Secondary | ICD-10-CM | POA: Insufficient documentation

## 2016-06-25 LAB — COMP PANEL: LEUKEMIA/LYMPHOMA

## 2016-06-25 LAB — URINALYSIS, COMPLETE (UACMP) WITH MICROSCOPIC
Bacteria, UA: NONE SEEN
Bilirubin Urine: NEGATIVE
Glucose, UA: NEGATIVE mg/dL
Hgb urine dipstick: NEGATIVE
KETONES UR: NEGATIVE mg/dL
LEUKOCYTES UA: NEGATIVE
Nitrite: NEGATIVE
Protein, ur: NEGATIVE mg/dL
SQUAMOUS EPITHELIAL / LPF: NONE SEEN
Specific Gravity, Urine: 1.019 (ref 1.005–1.030)
pH: 6 (ref 5.0–8.0)

## 2016-06-25 LAB — CBC
HEMATOCRIT: 30.6 % — AB (ref 40.0–52.0)
Hemoglobin: 10.1 g/dL — ABNORMAL LOW (ref 13.0–18.0)
MCH: 27 pg (ref 26.0–34.0)
MCHC: 32.9 g/dL (ref 32.0–36.0)
MCV: 82.3 fL (ref 80.0–100.0)
Platelets: 112 10*3/uL — ABNORMAL LOW (ref 150–440)
RBC: 3.73 MIL/uL — ABNORMAL LOW (ref 4.40–5.90)
RDW: 23.3 % — ABNORMAL HIGH (ref 11.5–14.5)
WBC: 31.2 10*3/uL — AB (ref 3.8–10.6)

## 2016-06-25 LAB — COMPREHENSIVE METABOLIC PANEL
ALT: 31 U/L (ref 17–63)
AST: 27 U/L (ref 15–41)
Albumin: 3.9 g/dL (ref 3.5–5.0)
Alkaline Phosphatase: 145 U/L — ABNORMAL HIGH (ref 38–126)
Anion gap: 6 (ref 5–15)
BUN: 34 mg/dL — AB (ref 6–20)
CHLORIDE: 98 mmol/L — AB (ref 101–111)
CO2: 29 mmol/L (ref 22–32)
Calcium: 10.3 mg/dL (ref 8.9–10.3)
Creatinine, Ser: 0.84 mg/dL (ref 0.61–1.24)
Glucose, Bld: 214 mg/dL — ABNORMAL HIGH (ref 65–99)
POTASSIUM: 4.4 mmol/L (ref 3.5–5.1)
Sodium: 133 mmol/L — ABNORMAL LOW (ref 135–145)
Total Bilirubin: 1.3 mg/dL — ABNORMAL HIGH (ref 0.3–1.2)
Total Protein: 6.4 g/dL — ABNORMAL LOW (ref 6.5–8.1)

## 2016-06-25 LAB — LIPASE, BLOOD: LIPASE: 15 U/L (ref 11–51)

## 2016-06-25 MED ORDER — IOPAMIDOL (ISOVUE-300) INJECTION 61%
30.0000 mL | Freq: Once | INTRAVENOUS | Status: AC
  Administered 2016-06-25: 30 mL via ORAL

## 2016-06-25 MED ORDER — SODIUM CHLORIDE 0.9 % IV BOLUS (SEPSIS)
1000.0000 mL | Freq: Once | INTRAVENOUS | Status: AC
  Administered 2016-06-25: 1000 mL via INTRAVENOUS

## 2016-06-25 MED ORDER — ONDANSETRON HCL 4 MG/2ML IJ SOLN
4.0000 mg | Freq: Once | INTRAMUSCULAR | Status: AC
  Administered 2016-06-25: 4 mg via INTRAVENOUS
  Filled 2016-06-25: qty 2

## 2016-06-25 MED ORDER — MORPHINE SULFATE (PF) 2 MG/ML IV SOLN
2.0000 mg | Freq: Once | INTRAVENOUS | Status: AC
  Administered 2016-06-25: 2 mg via INTRAVENOUS
  Filled 2016-06-25: qty 1

## 2016-06-25 NOTE — ED Provider Notes (Signed)
[facility] Emergency Department Provider Note  ____________________________________________  Time seen: Approximately 6:49 PM  I have reviewed the triage vital signs and the nursing notes.   HISTORY  Chief Complaint Abdominal Pain   HPI Matthew Brown is a 76 y.o. male history of renal transplant, lymphoma currently on chemotherapy, GIB, PUD, afib who presents for evaluation of abdominal pain. Patient received his most recent chemo treatment 5 days ago. The next day patient started to have diffuse abdominal pain, worse postprandially, dull, and constant. Patient also started to have multiple episodes of watery non-melanotic diarrhea. His wife gave him Imodium 2 days ago and since then he has had one normal bowel movement yesterday. Also started having vomiting yesterday nonbloody and nonbilious. No vomiting today. No fever or chills, no dysuria or hematuria, no chest pain or shortness of breath, no cough or body aches.  Past Medical History:  Diagnosis Date  . Benign prostatic hypertrophy   . Chronic headache 10/19/2015  . ED (erectile dysfunction)   . End stage renal disease ([place])   . Essential hypertension   . GERD (gastroesophageal reflux disease)   . GIB (gastrointestinal bleeding)    a. [id number] s/p R colic artery embolization;  b. 02/2015 EGD: duod ulcerative mass->Bx notable for coagulative necrosis - ? ischemia vs thrombosis-->coumadin d/c'd.  . Gout   . Hearing loss   . Hemorrhoids   . Hyperlipidemia   . Lymphoma ([place])   . Lymphoma ([place]) 2017  . Multiple thyroid nodules 06/06/2016   Noted on carotid US; dedicated US to be ordered by staff  . Osteoarthrosis, unspecified whether generalized or localized, lower leg   . Persistent atrial fibrillation ([place])    a. CHA2DS2VASc = 3-->coumadin d/c'd 02/2015 2/2 recurrent GIB.  [name] Prostatitis   . Pulmonary hypertension    a. 10/2014 Echo: EF 60-65%, mild to mod MR, mildly dil LA, nl RV, PASP 29mmHg.  [name] Renal  transplant recipient   . Ulcers of both great toes [facility])     Patient Active Problem List   Diagnosis Date Noted  . Gastroenteritis 06/25/2016  . Multiple thyroid nodules 06/06/2016  . Liver metastases ([place]) 05/26/2016  . Bone metastasis ([place]) 05/26/2016  . Non-intractable vomiting with nausea   . Diarrhea   . Neutropenic fever ([place]) 05/18/2016  . Carotid atherosclerosis, bilateral 05/09/2016  . Protein-calorie malnutrition, severe 05/08/2016  . Hypercalcemia 05/06/2016  . Renal transplant recipient 05/06/2016  . Post-transplant lymphoproliferative disorder ([place]) 05/06/2016  . Tumor lysis syndrome 05/06/2016  . High serum parathyroid hormone (PTH) 05/03/2016  . Pain in joint involving pelvic region and thigh 05/03/2016  . Diffuse large B cell lymphoma ([place]) 04/30/2016  . Abnormal positron emission tomography (PET) scan   . Anemia 04/29/2016  . Lymphoma ([place]) 04/28/2016  . Pneumothorax 04/28/2016  . Hyponatremia 04/28/2016  . Adenopathy 04/15/2016  . Bone disease 04/15/2016  . Weight loss 04/15/2016  . Spinal stenosis of cervical region 11/21/2015  . Pleural effusion, right 11/21/2015  . Right groin pain 10/19/2015  . Chronic headache 10/19/2015  . Hip strain 10/09/2015  . BPH with obstruction/lower urinary tract symptoms 09/03/2015  . Prostatitis, chronic 09/03/2015  . Erectile dysfunction of organic origin 09/03/2015  . Difficulty urinating 08/14/2015  . Accumulation of fluid in tissues 07/18/2015  . Persistent atrial fibrillation ([place])   . Pain in finger of right hand 06/15/2015  . Eustachian tube dysfunction, left 06/06/2015  . Chronic insomnia 06/06/2015  . H/O: upper GI bleed 06/06/2015  .  Pleural cavity effusion 04/07/2015  . Abdominal pain, generalized 04/04/2015  . Pneumonia due to Haemophilus influenzae ([place]) 03/16/2015  . Encounter for therapeutic drug monitoring 07/27/2013  . Long term (current) use of anticoagulants 09/25/2010  . Hyperlipidemia 10/25/2008    . Essential hypertension 10/25/2008  . ATRIAL FIBRILLATION 10/25/2008  . GERD 10/25/2008  . RENAL FAILURE, END STAGE 10/25/2008  . Osteoarthrosis, unspecified whether generalized or localized, involving lower leg 10/25/2008  . BENIGN PROSTATIC HYPERTROPHY, HX OF 10/25/2008    Past Surgical History:  Procedure Laterality Date  . BACK SURGERY    . ESOPHAGOGASTRODUODENOSCOPY  03/13/15   severe esophagitis, ulcerated mass  . HERNIA REPAIR  1974  . PERIPHERAL VASCULAR CATHETERIZATION N/A 05/07/2016   Procedure: Glori Luis Cath Insertion;  Surgeon: Algernon Huxley, MD;  Location: [facility];  Service: Cardiovascular;  Laterality: N/A;  . PROSTATE ABLATION    . STOMACH SURGERY     blood vessel burst  . THROAT SURGERY    . TOTAL KNEE ARTHROPLASTY      Prior to Admission medications   Medication Sig Start Date End Date Taking? Authorizing Provider  acetaminophen (TYLENOL) 325 MG tablet Take 2 tablets (650 mg total) by mouth every 6 (six) hours as needed for mild pain (or Fever >/= 101). 05/13/16  Yes Nicholes Mango, MD  allopurinol (ZYLOPRIM) 100 MG tablet Take 100 mg by mouth daily.     Yes Historical Provider, MD  COLCRYS 0.6 MG tablet Take 1 tablet by mouth 2 (two) times daily as needed. 05/03/16  Yes Historical Provider, MD  diphenhydrAMINE (BENADRYL) 25 mg capsule Take 1 capsule (25 mg total) by mouth at bedtime as needed for sleep. 05/13/16  Yes Nicholes Mango, MD  feeding supplement, ENSURE ENLIVE, (ENSURE ENLIVE) LIQD Take 237 mLs by mouth 3 (three) times daily between meals. 05/13/16  Yes Nicholes Mango, MD  finasteride (PROSCAR) 5 MG tablet Take 5 mg by mouth daily.     Yes Historical Provider, MD  furosemide (LASIX) 80 MG tablet Take 1 tablet by mouth daily as needed. 05/06/16  Yes Historical Provider, MD  hydroxypropyl methylcellulose (ISOPTO TEARS) 2.5 % ophthalmic solution Place 1 drop into both eyes as needed.    Yes Historical Provider, MD  lactulose (CHRONULAC) 10 GM/15ML solution  Take 7.5-15 mLs (5-10 g total) by mouth daily as needed for mild constipation. 11/14/15  Yes Arnetha Courser, MD  Multiple Vitamin (MULTIVITAMIN) tablet Take 1 tablet by mouth daily.     Yes Historical Provider, MD  ondansetron (ZOFRAN) 4 MG tablet Take 1 tablet (4 mg total) by mouth every 6 (six) hours as needed for nausea. 05/22/16  Yes Vaughan Basta, MD  oxybutynin (DITROPAN-XL) 5 MG 24 hr tablet Take 5 mg by mouth daily.   Yes Historical Provider, MD  oxyCODONE-acetaminophen (ROXICET) 5-325 MG tablet Take 0.5-1 tablets by mouth every 6 (six) hours as needed for severe pain. 05/26/16  Yes Lequita Asal, MD  predniSONE (DELTASONE) 5 MG tablet Take 5 mg by mouth daily.  11/23/13  Yes Historical Provider, MD  prochlorperazine (COMPAZINE) 10 MG tablet Take 1 tablet by mouth daily as needed. 05/17/16  Yes Historical Provider, MD  sucralfate (CARAFATE) 1 g tablet Take 1 tablet (1 g total) by mouth 4 (four) times daily. Resume taking after one week- once finished taking oral levaquine. ( to avoid interaction.) 05/22/16  Yes Vaughan Basta, MD  tacrolimus (PROGRAF) 1 MG capsule Take 3 mg by mouth 2 (two) times daily. Reported  on 08/16/2015   Yes Historical Provider, MD  Tamsulosin HCl (FLOMAX) 0.4 MG CAPS Take 0.4 mg by mouth daily.     Yes Historical Provider, MD  albuterol (PROAIR HFA) 108 (90 BASE) MCG/ACT inhaler Inhale 1-2 puffs into the lungs every 4 (four) hours as needed.  02/20/14   Historical Provider, MD  HYDROcodone-acetaminophen (NORCO) 5-325 MG tablet Take 1 tablet by mouth every 4 (four) hours as needed for moderate pain. Patient not taking: Reported on 06/25/2016 05/02/16   Lequita Asal, MD  pantoprazole (PROTONIX) 40 MG tablet Take 1 tablet (40 mg total) by mouth 2 (two) times daily. 06/28/16   Loletha Grayer, MD  predniSONE (DELTASONE) 20 MG tablet Take 70 mg (3.5 pills) daily for 5 days after each cycle of chemotherapy. 05/30/16   Lequita Asal, MD  simethicone  (MYLICON) 80 MG chewable tablet Chew 1 tablet (80 mg total) by mouth every 6 (six) hours as needed for flatulence. 06/28/16   Loletha Grayer, MD    Allergies Patient has no known allergies.  Family History  Problem Relation Age of Onset  . Cancer Mother     throat  . Diabetes Brother   . Heart disease Brother   . Stroke Brother   . Hypertension Brother   . Diabetes Sister   . Heart disease Sister   . Hypertension Sister   . Diabetes Sister   . Diabetes Brother   . COPD Neg Hx   . Kidney disease Neg Hx   . Prostate cancer Neg Hx     Social History Social History  Substance Use Topics  . Smoking status: Former Smoker    Packs/day: 1.00    Years: 25.00    Types: Cigarettes    Quit date: 06/23/1978  . Smokeless tobacco: Never Used  . Alcohol use No    Review of Systems  Constitutional: Negative for fever. Eyes: Negative for visual changes. ENT: Negative for sore throat. Neck: No neck pain  Cardiovascular: Negative for chest pain. Respiratory: Negative for shortness of breath. Gastrointestinal: + diffuse abdominal pain, vomiting and diarrhea. Genitourinary: Negative for dysuria. Musculoskeletal: Negative for back pain. Skin: Negative for rash. Neurological: Negative for headaches, weakness or numbness. Psych: No SI or HI  ____________________________________________   PHYSICAL EXAM:  VITAL SIGNS: ED Triage Vitals  Enc Vitals Group     BP 06/25/16 1736 (!) 154/45     Pulse Rate 06/25/16 1736 99     Resp 06/25/16 1736 20     Temp 06/25/16 1736 97.5 F (36.4 C)     Temp Source 06/25/16 1736 Oral     SpO2 06/25/16 1736 99 %     Weight 06/25/16 1737 130 lb (59 kg)     Height 06/25/16 1737 5\' 6"  (1.676 m)     Head Circumference --      Peak Flow --      Pain Score 06/25/16 1737 8     Pain Loc --      Pain Edu? --      Excl. in [place]? --     Constitutional: Alert and oriented. Well appearing and in no apparent distress. HEENT:      Head: Normocephalic and  atraumatic.         Eyes: Conjunctivae are normal. Sclera is non-icteric. EOMI. PERRL      Mouth/Throat: Mucous membranes are moist.       Neck: Supple with no signs of meningismus. Cardiovascular: Regular rate and rhythm. No murmurs, gallops,  or rubs. 2+ symmetrical distal pulses are present in all extremities. No JVD. Respiratory: Normal respiratory effort. Lungs are clear to auscultation bilaterally. No wheezes, crackles, or rhonchi.  Gastrointestinal: Soft, non tender, and non distended with positive bowel sounds. No rebound or guarding. Musculoskeletal: Nontender with normal range of motion in all extremities. No edema, cyanosis, or erythema of extremities. Neurologic: Normal speech and language. Face is symmetric. Moving all extremities. No gross focal neurologic deficits are appreciated. Skin: Skin is warm, dry and intact. No rash noted. Psychiatric: Mood and affect are normal. Speech and behavior are normal.  ____________________________________________   LABS (all labs ordered are listed, but only abnormal results are displayed)  Labs Reviewed  C DIFFICILE QUICK SCREEN W PCR REFLEX - Abnormal; Notable for the following:       Result Value   C Diff antigen POSITIVE (*)    All other components within normal limits  COMPREHENSIVE METABOLIC PANEL - Abnormal; Notable for the following:    Sodium 133 (*)    Chloride 98 (*)    Glucose, Bld 214 (*)    BUN 34 (*)    Total Protein 6.4 (*)    Alkaline Phosphatase 145 (*)    Total Bilirubin 1.3 (*)    All other components within normal limits  CBC - Abnormal; Notable for the following:    WBC 31.2 (*)    RBC 3.73 (*)    Hemoglobin 10.1 (*)    HCT 30.6 (*)    RDW 23.3 (*)    Platelets 112 (*)    All other components within normal limits  URINALYSIS, COMPLETE (UACMP) WITH MICROSCOPIC - Abnormal; Notable for the following:    Color, Urine YELLOW (*)    APPearance CLEAR (*)    All other components within normal limits  BASIC  METABOLIC PANEL - Abnormal; Notable for the following:    Sodium 134 (*)    Glucose, Bld 122 (*)    BUN 32 (*)    Anion gap 3 (*)    All other components within normal limits  CBC WITH DIFFERENTIAL/PLATELET - Abnormal; Notable for the following:    WBC 13.9 (*)    RBC 3.31 (*)    Hemoglobin 9.1 (*)    HCT 27.4 (*)    RDW 23.4 (*)    Platelets 92 (*)    Neutro Abs 12.9 (*)    Lymphs Abs 0.3 (*)    All other components within normal limits  GLUCOSE, CAPILLARY - Abnormal; Notable for the following:    Glucose-Capillary 101 (*)    All other components within normal limits  GLUCOSE, CAPILLARY - Abnormal; Notable for the following:    Glucose-Capillary 111 (*)    All other components within normal limits  BASIC METABOLIC PANEL - Abnormal; Notable for the following:    Glucose, Bld 103 (*)    BUN 33 (*)    Calcium 8.6 (*)    Anion gap 4 (*)    All other components within normal limits  GLUCOSE, CAPILLARY - Abnormal; Notable for the following:    Glucose-Capillary 101 (*)    All other components within normal limits  GLUCOSE, CAPILLARY - Abnormal; Notable for the following:    Glucose-Capillary 100 (*)    All other components within normal limits  GLUCOSE, CAPILLARY - Abnormal; Notable for the following:    Glucose-Capillary 109 (*)    All other components within normal limits  GLUCOSE, CAPILLARY - Abnormal; Notable for the following:  Glucose-Capillary 121 (*)    All other components within normal limits  CLOSTRIDIUM DIFFICILE BY PCR  STOOL CULTURE  OVA + PARASITE EXAM  LIPASE, BLOOD  GLUCOSE, CAPILLARY  GLUCOSE, CAPILLARY  GLUCOSE, CAPILLARY  GLUCOSE, CAPILLARY   ____________________________________________  EKG  none ____________________________________________  RADIOLOGY  CT a/p: PND ____________________________________________   PROCEDURES  Procedure(s) performed: None Procedures Critical Care performed:   None ____________________________________________   INITIAL IMPRESSION / ASSESSMENT AND PLAN / ED COURSE  76 y.o. male history of renal transplant, lymphoma currently on chemotherapy, GIB, PUD, afib who presents for evaluation of diffuse abdominal pain, diarrhea, and vomiting x 4 days. Patient is well-appearing and in no distress, has normal vital signs, his abdomen is soft and nontender throughout. Patient is immune suppressed due to his renal transplant and currently on chemotherapy for lymphoma. His blood work shows normal CMP, normal lipase, and an elevated white count at 31K (patient's WBC 7.5 one week ago). Plan for CT without contrast, urinalysis. Will give IVF and zofran and morphine for pain.  Clinical Course    Care transferred to Dr. Craig Staggers at [facility] pending CT with plan to admit to hospitalist service.   Pertinent labs & imaging results that were available during my care of the patient were reviewed by me and considered in my medical decision making (see chart for details).    ____________________________________________   FINAL CLINICAL IMPRESSION(S) / ED DIAGNOSES  Final diagnoses:  Gastroenteritis      NEW MEDICATIONS STARTED DURING THIS VISIT:  Discharge Medication List as of 06/28/2016 12:09 PM       Note:  This document was prepared using Dragon voice recognition software and may include unintentional dictation errors.    Rudene Re, MD 06/29/16 [phone]

## 2016-06-25 NOTE — Telephone Encounter (Signed)
Called and spoke with Matthew Brown and patient advising to go to ER for evaluation and possible imaging. Patient agrees to go to [facility] ED

## 2016-06-25 NOTE — H&P (Signed)
History and Physical   [facility] - [place] @ [facility] Admission History and Physical [employer], D.O.    Patient Name: Matthew Brown MR#: [id number] Date of Birth: 1940/11/13 Date of Admission: 06/25/2016  Referring MD/NP/PA: Dr. Mariea Clonts Primary Care Physician: Enid Derry, MD Outpatient Specialists: Dr. Mike Gip  Patient coming from: Home  Chief Complaint: Abdominal pain  HPI: Matthew Brown is a 76 y.o. male with a known history of end-stage renal disease status post renal transplant, lymphoma on chemotherapy, persistent atrial fibrillation, GI bleed, pulmonary hypertension, presents to the emergency department for evaluation of abdominal pain.  Patient was in a usual state of health until 4 days ago when the patient describes the gradual onset of abdominal pain which was described as diffuse, crampy, intermittent, worse after eating. His wife reports that he has also had several episodes of diarrhea which responded to Imodium and nonbloody, nonbilious vomiting.   Patient has chemotherapy once per month, most recent was 5 days ago. He also takes prednisone for 5 days following each round of chemotherapy. His last dose of prednisone was today. He has not been on any antibiotics recently.   Otherwise there has been no change in status. Patient has been taking medication as prescribed and there has been no recent change in medication or diet.  There has been no recent illness, travel or sick contacts.    Patient denies fevers/chills, weakness, dizziness, chest pain, shortness of breath, dysuria/frequency, changes in mental status.   Review of Systems:  CONSTITUTIONAL: No fever/chills, fatigue, weakness, weight gain/loss, headache. EYES: No blurry or double vision. ENT: No tinnitus, postnasal drip, redness or soreness of the oropharynx. RESPIRATORY: No cough, dyspnea, wheeze, hemoptysis.  CARDIOVASCULAR: No chest pain, palpitations, syncope, orthopnea,  GASTROPositiveausea,  vomiting, abdominal pain,  diarrhea.  negative constipation.  No hematemesis, melena or hematochezia. GENITOURINARY: No dysuria, frequency, hematuria. ENDOCRINE: No polyuria or nocturia. No heat or cold intolerance. HEMATOLOGY: No anemia, bruising, bleeding. INTEGUMENTARY: No rashes, ulcers, lesions. MUSCULOSKELETAL: No arthritis, gout, dyspnea.  NEUROLOGIC: No numbness, tingling, ataxia, seizure-type activity, weakness. PSYCHIATRIC: No anxiety, depression, insomnia.   Past Medical History:  Diagnosis Date  . Benign prostatic hypertrophy   . Chronic headache 10/19/2015  . ED (erectile dysfunction)   . End stage renal disease ([place])   . Essential hypertension   . GERD (gastroesophageal reflux disease)   . GIB (gastrointestinal bleeding)    a. [id number] s/p R colic artery embolization;  b. 02/2015 EGD: duod ulcerative mass->Bx notable for coagulative necrosis - ? ischemia vs thrombosis-->coumadin d/c'd.  . Gout   . Hearing loss   . Hemorrhoids   . Hyperlipidemia   . Lymphoma ([place])   . Lymphoma ([place]) 2017  . Multiple thyroid nodules 06/06/2016   Noted on carotid US; dedicated US to be ordered by staff  . Osteoarthrosis, unspecified whether generalized or localized, lower leg   . Persistent atrial fibrillation ([place])    a. CHA2DS2VASc = 3-->coumadin d/c'd 02/2015 2/2 recurrent GIB.  [name] Prostatitis   . Pulmonary hypertension    a. 10/2014 Echo: EF 60-65%, mild to mod MR, mildly dil LA, nl RV, PASP 33mmHg.  [name] Renal transplant recipient   . Ulcers of both great toes [facility]     Past Surgical History:  Procedure Laterality Date  . BACK SURGERY    . ESOPHAGOGASTRODUODENOSCOPY  03/13/15   severe esophagitis, ulcerated mass  . HERNIA REPAIR  1974  . PERIPHERAL VASCULAR CATHETERIZATION N/A 05/07/2016   Procedure: Glori Luis Cath Insertion;  Surgeon: Algernon Huxley, MD;  Location: [facility];  Service: Cardiovascular;  Laterality: N/A;  . PROSTATE ABLATION    . STOMACH SURGERY     blood  vessel burst  . THROAT SURGERY    . TOTAL KNEE ARTHROPLASTY       reports that he quit smoking about 38 years ago. His smoking use included Cigarettes. He has a 25.00 pack-year smoking history. He has never used smokeless tobacco. He reports that he does not drink alcohol or use drugs.  No Known Allergies  Family History  Problem Relation Age of Onset  . Cancer Mother     throat  . Diabetes Brother   . Heart disease Brother   . Stroke Brother   . Hypertension Brother   . Diabetes Sister   . Heart disease Sister   . Hypertension Sister   . Diabetes Sister   . Diabetes Brother   . COPD Neg Hx   . Kidney disease Neg Hx   . Prostate cancer Neg Hx    Family history has been reviewed and confirmed with patient.   Prior to Admission medications   Medication Sig Start Date End Date Taking? Authorizing Provider  acetaminophen (TYLENOL) 325 MG tablet Take 2 tablets (650 mg total) by mouth every 6 (six) hours as needed for mild pain (or Fever >/= 101). 05/13/16  Yes Nicholes Mango, MD  allopurinol (ZYLOPRIM) 100 MG tablet Take 100 mg by mouth daily.     Yes Historical Provider, MD  COLCRYS 0.6 MG tablet Take 1 tablet by mouth 2 (two) times daily as needed. 05/03/16  Yes Historical Provider, MD  diphenhydrAMINE (BENADRYL) 25 mg capsule Take 1 capsule (25 mg total) by mouth at bedtime as needed for sleep. 05/13/16  Yes Nicholes Mango, MD  docusate sodium (COLACE) 100 MG capsule Take 1 capsule (100 mg total) by mouth 2 (two) times daily. 05/13/16  Yes Nicholes Mango, MD  feeding supplement (BOOST / RESOURCE BREEZE) LIQD Take 1 Container by mouth 3 (three) times daily between meals. 05/13/16  Yes Nicholes Mango, MD  finasteride (PROSCAR) 5 MG tablet Take 5 mg by mouth daily.     Yes Historical Provider, MD  furosemide (LASIX) 80 MG tablet Take 1 tablet by mouth daily as needed. 05/06/16  Yes Historical Provider, MD  lactulose (CHRONULAC) 10 GM/15ML solution Take 7.5-15 mLs (5-10 g total) by mouth daily as  needed for mild constipation. 11/14/15  Yes Arnetha Courser, MD  Multiple Vitamin (MULTIVITAMIN) tablet Take 1 tablet by mouth daily.     Yes Historical Provider, MD  ondansetron (ZOFRAN) 4 MG tablet Take 1 tablet (4 mg total) by mouth every 6 (six) hours as needed for nausea. 05/22/16  Yes Vaughan Basta, MD  oxybutynin (DITROPAN-XL) 5 MG 24 hr tablet Take 5 mg by mouth daily.   Yes Historical Provider, MD  oxyCODONE-acetaminophen (ROXICET) 5-325 MG tablet Take 0.5-1 tablets by mouth every 6 (six) hours as needed for severe pain. 05/26/16  Yes Lequita Asal, MD  pantoprazole (PROTONIX) 40 MG tablet Take 1 tablet (40 mg total) by mouth 2 (two) times daily. 05/22/16  Yes Vaughan Basta, MD  polyethylene glycol (MIRALAX / GLYCOLAX) packet Take 17 g by mouth daily as needed (constipation). 05/13/16  Yes Nicholes Mango, MD  predniSONE (DELTASONE) 5 MG tablet Take 5 mg by mouth daily.  11/23/13  Yes Historical Provider, MD  prochlorperazine (COMPAZINE) 10 MG tablet Take 1 tablet by mouth daily as needed. 05/17/16  Yes Historical Provider, MD  simethicone (MYLICON) 80 MG chewable tablet Chew 1 tablet (80 mg total) by mouth every 6 (six) hours as needed for flatulence. 05/22/16  Yes Vaughan Basta, MD  tacrolimus (PROGRAF) 1 MG capsule Take 3 mg by mouth 2 (two) times daily. Reported on 08/16/2015   Yes Historical Provider, MD  Tamsulosin HCl (FLOMAX) 0.4 MG CAPS Take 0.4 mg by mouth daily.     Yes Historical Provider, MD  albuterol (PROAIR HFA) 108 (90 BASE) MCG/ACT inhaler Inhale 1-2 puffs into the lungs every 4 (four) hours as needed.  02/20/14   Historical Provider, MD  feeding supplement, ENSURE ENLIVE, (ENSURE ENLIVE) LIQD Take 237 mLs by mouth 3 (three) times daily between meals. 05/13/16   Nicholes Mango, MD  HYDROcodone-acetaminophen (NORCO) 5-325 MG tablet Take 1 tablet by mouth every 4 (four) hours as needed for moderate pain. Patient not taking: Reported on 06/25/2016 05/02/16    Lequita Asal, MD  hydroxypropyl methylcellulose (ISOPTO TEARS) 2.5 % ophthalmic solution Place 1 drop into both eyes as needed.     Historical Provider, MD  predniSONE (DELTASONE) 20 MG tablet Take 70 mg (3.5 pills) daily for 5 days after each cycle of chemotherapy. 05/30/16   Lequita Asal, MD  sucralfate (CARAFATE) 1 g tablet Take 1 tablet (1 g total) by mouth 4 (four) times daily. Resume taking after one week- once finished taking oral levaquine. ( to avoid interaction.) 05/22/16   Vaughan Basta, MD    Physical Exam: Vitals:   06/25/16 1930 06/25/16 2000 06/25/16 2021 06/25/16 2200  BP: 137/70 (!) 174/71 (!) 147/58 (!) 147/56  Pulse: 69 85 78 67  Resp: 18  20 19   Temp:      TempSrc:      SpO2: 98% 100% 97% 98%  Weight:      Height:        GENERAL: 75 y.o.-year-oBlack maleatient, well-developed, well-nourished lying in the bed in no acute distress.  Pleasant and cooperative.   HEENT: Head atraumatic, normocephalic. Pupils equal, round, reactive to light and accommodation. No scleral icterus. Extraocular muscles intact. Nares are patent. Oropharynx is clear. Mucus membranes moist. NECK: Supple, full range of motion. No JVD, no bruit heard. No thyroid enlargement, no tenderness, no cervical lymphadenopathy. CHEST: Normal breath sounds bilaterally. No wheezing, rales, rhonchi or crackles. No use of accessory muscles of respiration.  No reproducible chest wall tenderness.  CARDIOVASCULAR: S1, S2 normal. No murmurs, rubs, or gallops. Cap refill <2 seconds. Pulses intact distally.  ABDOMEN: Soft, nondismildly tender bilateral lower quadrants. No rebound, guarding, rigidity. Normoactive bowel sounds present in all four quadrants. No organomegaly or mass. EXTREMITIES: No pedal edema, cyanosis, or clubbing. NEUROLOGIC: Cranial nerves II through XII are grossly intact with no focal sensorimotor deficit. Muscle strength 5/5 in all extremities. Sensation intact. Gait not  checked. SKIN: Warm, dry, and intact without obvious rash, lesion, or ulcer.   Labs on Admission: I have personally reviewed following labs and imaging studies  CBC:  Recent Labs Lab 06/19/16 0946 06/20/16 0842 06/25/16 1739  WBC 8.6 7.5 31.2*  NEUTROABS 8.0* 6.4  --   HGB 9.1* 9.6* 10.1*  HCT 28.0* 29.4* 30.6*  MCV 82.2 82.9 82.3  PLT 143* 135* [id number]*   Basic Metabolic Panel:  Recent Labs Lab 06/20/16 0842 06/25/16 1739  NA 136 133*  K 3.5 4.4  CL 108 98*  CO2 22 29  GLUCOSE 149* 214*  BUN 17 34*  CREATININE 0.99 0.84  CALCIUM  9.3 10.3   GFR: Estimated Creatinine Clearance: 62.4 mL/min (by C-G formula based on SCr of 0.84 mg/dL). Liver Function Tests:  Recent Labs Lab 06/20/16 0842 06/25/16 1739  AST 24 27  ALT 10* 31  ALKPHOS 98 145*  BILITOT 0.5 1.3*  PROT 5.8* 6.4*  ALBUMIN 3.5 3.9    Recent Labs Lab 06/25/16 1739  LIPASE 15   No results for input(s): AMMONIA in the last 168 hours. Coagulation Profile:  Recent Labs Lab 06/19/16 0946  INR 1.21   Cardiac Enzymes: No results for input(s): CKTOTAL, CKMB, CKMBINDEX, TROPONINI in the last 168 hours. BNP (last 3 results) No results for input(s): PROBNP in the last 8760 hours. HbA1C: No results for input(s): HGBA1C in the last 72 hours. CBG: No results for input(s): GLUCAP in the last 168 hours. Lipid Profile: No results for input(s): CHOL, HDL, LDLCALC, TRIG, CHOLHDL, LDLDIRECT in the last 72 hours. Thyroid Function Tests: No results for input(s): TSH, T4TOTAL, FREET4, T3FREE, THYROIDAB in the last 72 hours. Anemia Panel: No results for input(s): VITAMINB12, FOLATE, FERRITIN, TIBC, IRON, RETICCTPCT in the last 72 hours. Urine analysis:    Component Value Date/Time   COLORURINE YELLOW (A) 06/25/2016 2016   APPEARANCEUR CLEAR (A) 06/25/2016 2016   APPEARANCEUR Clear 10/09/2015 0849   LABSPEC 1.019 06/25/2016 2016   PHURINE 6.0 06/25/2016 2016   GLUCOSEU NEGATIVE 06/25/2016 2016   HGBUR  NEGATIVE 06/25/2016 2016   BILIRUBINUR NEGATIVE 06/25/2016 2016   BILIRUBINUR neg 10/19/2015 1608   BILIRUBINUR Negative 10/09/2015 [place] 06/25/2016 2016   PROTEINUR NEGATIVE 06/25/2016 2016   UROBILINOGEN 0.2 10/19/2015 1608   NITRITE NEGATIVE 06/25/2016 2016   LEUKOCYTESUR NEGATIVE 06/25/2016 2016   LEUKOCYTESUR Negative 10/09/2015 0849   Sepsis Labs: @LABRCNTIP (procalcitonin:4,lacticidven:4) ) Recent Results (from the past 240 hour(s))  CSF culture     Status: None   Collection Time: 06/19/16 12:27 PM  Result Value Ref Range Status   Specimen Description CSF  Final   Special Requests NONE  Final   Gram Stain   Final    NO WBC SEEN NO RED BLOOD CELLS NO ORGANISMS SEEN    Culture   Final    NO GROWTH 3 DAYS Performed at [facility]    Report Status 06/22/2016 FINAL  Final     Radiological Exams on Admission: Ct Abdomen Pelvis Wo Contrast  Result Date: 06/25/2016 CLINICAL DATA:  Lymphoma on chemotherapy. Treated 1 week ago now complaining of lower abdominal pain with diarrhea, nausea and vomiting. EXAM: CT ABDOMEN AND PELVIS WITHOUT CONTRAST TECHNIQUE: Multidetector CT imaging of the abdomen and pelvis was performed following the standard protocol without IV contrast. COMPARISON:  06/13/2016 CT lumbar spine, PET-CT 04/28/2016 FINDINGS: Lower chest: Cardiomegaly with coronary arteriosclerosis. Port catheter tip in the cavoatrial junction. Small bilateral pleural effusions right greater than left with adjacent atelectasis. Minimal lingular atelectasis. Thoracic aortic atherosclerosis. Hepatobiliary: The unenhanced liver demonstrates no focal mass. Punctate calcification in the right hepatic lobe possibly a small granuloma or vascular calcification. Gallbladder does not appear to be distended. No gallstones are seen. Pancreas: Atrophic pancreas. Spleen: No splenomegaly. Adrenals/Urinary Tract: Normal appearing bilateral adrenal glands. Atrophic bilateral  kidneys with renovascular calcifications bilaterally. Small cortical and exophytic lesions are noted on the right, measuring between 13 and 16 mm. The exophytic lesion is not simple by CT may represent a complex or proteinaceous cyst. Further characterization is not possible given lack of IV contrast. Pelvic right kidney is noted. Stomach/Bowel: Contrast  distended slightly thickened stomach with contrast filled mildly thickened proximal small bowel loops consistent with gastroenteritis. Oral contrast reaches large bowel. Normal-appearing appendix. Descending colonic and sigmoid diverticulosis without acute diverticulitis. Vascular/Lymphatic: The mesenteric and retroperitoneal periaortic adenopathy appears to have slightly decreased in size since prior PET-CT from 04/28/2016. Right para-aortic lymph node measured at the same level previously now measures 1.6 cm in thickness versus 3.2 cm previously. Left para- aortic adenopathy now measures approximately 3.8 cm versus 5 cm previously. Mesenteric adenopathy now measures 1.7 cm versus 2.5 cm. Reproductive: Normal sized partially calcified prostate. Left inguinal surgical clips are present. Other: No free air or free fluid. Musculoskeletal: Solid osseous fusion of the posterior elements from L3 through S1 with cerclage wires in place. Degenerative disc disease with height loss from L1 through L3. Subtle heterogeneity of the L3 through L5 vertebral bodies as previously described which cannot exclude lytic disease. Osteoarthritis of hips with subchondral cysts noted about both hips. Lytic lesion of the left sacrum with soft tissue component. IMPRESSION: Fluid-filled distended and slightly thickened appearance of the stomach and proximal small bowel consistent with gastroenteritis. No bowel obstruction noted. Interval decrease in size of retroperitoneal and mesenteric lymphadenopathy. Small bilateral pleural effusions right greater than left. Right transplanted pelvic  kidney with atrophic bilateral native kidneys. Small stable right-sided renal cysts. Lytic lucencies of L3, L4 and L5 and the left sacrum consistent osteolytic metastatic disease. Electronically Signed   By: Ashley Royalty M.D.   On: 06/25/2016 21:55    Assessment/Plan Active Problems:   Gastroenteritis    This is a 76 y.o. male with a history of  end-stage renal disease status post renal transplant, lymphoma on chemotherapy, persistent atrial fibrillation, GI bleed, pulmonary hypertension now being admitted with: 1. Abdominal pain and dehydration secondary to gastroenteritis-Will admit to observation for IV fluid hydration, pain control, antiemetics. We'll check stool for C. difficile as ordered by the emergency department, stool cultures.  2. Leukocytosis, likely multifactorial-secondary to acute illness as well as recent prednisone use. Monitor CBC in a.m. with differential.  3. Thrombocytopenia, chronic-monitor CBC in a.m.  4. Hyperglycemia, no history of diabetes-we'll check CBGs and cover with insulin if necessary. Likely secondary to recent prednisone use. 5. History of gout-continue allopurinol 6. History of BPH-continue Proscar and Flomax and [place] 7. History of chronic pain -continue home medication regimen 8. History of renal transplant-continue Prograf and prednisone 5 daily 9. History of GERD-continue Carafate and Protonix   Admission status: Observation IV FluidsIV normal saline. Diet/Nutrition: Clear liquid Consults callednone DVT Px: Lovenox, SCDs and early ambulation Code Status: Full Code  Disposition Plan: to home in less than 24 hours  All the records are reviewed and case discussed with ED provider. Management plans discussed with the patient and/or family who express understanding and agree with plan of care.  Chrisette Man D.O. on 06/25/2016 at 10:59 PM Between 7am to 6pm - Pager - [phone] After 6pm go to [email or web address] - [employer] [facility]  [place] [facility] Office [phone] CC: Primary care physician; Enid Derry, MD   06/25/2016, 10:59 PM

## 2016-06-25 NOTE — Telephone Encounter (Signed)
Continues to have abdominal pain at umbilicus radiating 3 -4 inches out from there. Not having diarrhea or vomiting any longer, rates pain at 8/10 unable to eat or drink due to it causing increase in pain. He is holding his stomach when he gets up otherwise he is lying down in pain. Please advise

## 2016-06-25 NOTE — ED Triage Notes (Signed)
Pt is being treated for lymphoma, pt had chemo 1 week ago, pt complains of lower abdominal pain with diarrhea and nausea/vomiting, pt reports decrease in appetite

## 2016-06-25 NOTE — Telephone Encounter (Signed)
Spoke with Hassan Rowan, Triage RN.  MD recommends patient go to ED.  She feels patient may need to be imaged.

## 2016-06-25 NOTE — ED Notes (Signed)
Dr. Veronese in room to assess patient.  Will continue to monitor.  

## 2016-06-26 LAB — BASIC METABOLIC PANEL
Anion gap: 3 — ABNORMAL LOW (ref 5–15)
BUN: 32 mg/dL — AB (ref 6–20)
CALCIUM: 9.1 mg/dL (ref 8.9–10.3)
CO2: 29 mmol/L (ref 22–32)
CREATININE: 0.78 mg/dL (ref 0.61–1.24)
Chloride: 102 mmol/L (ref 101–111)
GFR calc non Af Amer: >60 mL/min (ref >60)
GLUCOSE: 122 mg/dL — AB (ref 65–99)
Potassium: 4.3 mmol/L (ref 3.5–5.1)
Sodium: 134 mmol/L — ABNORMAL LOW (ref 135–145)

## 2016-06-26 LAB — CBC WITH DIFFERENTIAL/PLATELET
Basophils Absolute: 0 10*3/uL (ref 0–0.1)
Basophils Relative: 0 %
Eosinophils Absolute: 0.1 10*3/uL (ref 0–0.7)
Eosinophils Relative: 1 %
HCT: 27.4 % — ABNORMAL LOW (ref 40.0–52.0)
Hemoglobin: 9.1 g/dL — ABNORMAL LOW (ref 13.0–18.0)
Lymphocytes Relative: 2 %
Lymphs Abs: 0.3 10*3/uL — ABNORMAL LOW (ref 1.0–3.6)
MCH: 27.5 pg (ref 26.0–34.0)
MCHC: 33.2 g/dL (ref 32.0–36.0)
MCV: 82.7 fL (ref 80.0–100.0)
Monocytes Absolute: 0.6 10*3/uL (ref 0.2–1.0)
Monocytes Relative: 4 %
Neutro Abs: 12.9 10*3/uL — ABNORMAL HIGH (ref 1.4–6.5)
Neutrophils Relative %: 93 %
Platelets: 92 10*3/uL — ABNORMAL LOW (ref 150–440)
RBC: 3.31 MIL/uL — ABNORMAL LOW (ref 4.40–5.90)
RDW: 23.4 % — ABNORMAL HIGH (ref 11.5–14.5)
WBC: 13.9 10*3/uL — ABNORMAL HIGH (ref 3.8–10.6)

## 2016-06-26 LAB — GLUCOSE, CAPILLARY
GLUCOSE-CAPILLARY: 101 mg/dL — AB (ref 65–99)
GLUCOSE-CAPILLARY: 90 mg/dL (ref 65–99)
GLUCOSE-CAPILLARY: 111 mg/dL — AB (ref 65–99)
GLUCOSE-CAPILLARY: 101 mg/dL — AB (ref 65–99)

## 2016-06-26 LAB — CLOSTRIDIUM DIFFICILE BY PCR: Toxigenic C. Difficile by PCR: NEGATIVE

## 2016-06-26 LAB — C DIFFICILE QUICK SCREEN W PCR REFLEX
C DIFFICLE (CDIFF) ANTIGEN: POSITIVE — AB
C Diff toxin: NEGATIVE

## 2016-06-26 MED ORDER — UNJURY CHICKEN SOUP POWDER
8.0000 [oz_av] | Freq: Three times a day (TID) | ORAL | Status: DC
  Administered 2016-06-26: 8 [oz_av] via ORAL

## 2016-06-26 MED ORDER — ENOXAPARIN SODIUM 40 MG/0.4ML ~~LOC~~ SOLN
40.0000 mg | SUBCUTANEOUS | Status: DC
  Administered 2016-06-26 – 2016-06-28 (×3): 40 mg via SUBCUTANEOUS
  Filled 2016-06-26 (×3): qty 0.4

## 2016-06-26 MED ORDER — FUROSEMIDE 40 MG PO TABS: 80.0000 mg | ORAL_TABLET | Freq: Every day | ORAL | Status: DC | PRN

## 2016-06-26 MED ORDER — PREDNISONE 5 MG PO TABS
5.0000 mg | ORAL_TABLET | Freq: Every day | ORAL | Status: DC
  Administered 2016-06-26 – 2016-06-28 (×3): 5 mg via ORAL
  Filled 2016-06-26 (×3): qty 1

## 2016-06-26 MED ORDER — TACROLIMUS 1 MG PO CAPS
3.0000 mg | ORAL_CAPSULE | Freq: Two times a day (BID) | ORAL | Status: DC
  Administered 2016-06-26 – 2016-06-28 (×6): 3 mg via ORAL
  Filled 2016-06-26 (×7): qty 3

## 2016-06-26 MED ORDER — ENSURE ENLIVE PO LIQD: 1.0000 | Freq: Three times a day (TID) | ORAL | Status: DC

## 2016-06-26 MED ORDER — ALLOPURINOL 100 MG PO TABS
100.0000 mg | ORAL_TABLET | Freq: Every day | ORAL | Status: DC
  Administered 2016-06-26 – 2016-06-28 (×3): 100 mg via ORAL
  Filled 2016-06-26 (×3): qty 1

## 2016-06-26 MED ORDER — BISACODYL 5 MG PO TBEC: 5.0000 mg | DELAYED_RELEASE_TABLET | Freq: Every day | ORAL | Status: DC | PRN

## 2016-06-26 MED ORDER — ADULT MULTIVITAMIN W/MINERALS CH
1.0000 | ORAL_TABLET | Freq: Every day | ORAL | Status: DC
  Administered 2016-06-26 – 2016-06-28 (×3): 1 via ORAL
  Filled 2016-06-26 (×3): qty 1

## 2016-06-26 MED ORDER — ALBUTEROL SULFATE (2.5 MG/3ML) 0.083% IN NEBU: 3.0000 mL | INHALATION_SOLUTION | RESPIRATORY_TRACT | Status: DC | PRN

## 2016-06-26 MED ORDER — SIMETHICONE 80 MG PO CHEW
80.0000 mg | CHEWABLE_TABLET | Freq: Four times a day (QID) | ORAL | Status: DC | PRN
  Administered 2016-06-26: 10:00:00 80 mg via ORAL
  Filled 2016-06-26: qty 1

## 2016-06-26 MED ORDER — OXYBUTYNIN CHLORIDE ER 5 MG PO TB24
5.0000 mg | ORAL_TABLET | Freq: Every day | ORAL | Status: DC
  Administered 2016-06-26 – 2016-06-28 (×3): 5 mg via ORAL
  Filled 2016-06-26 (×3): qty 1

## 2016-06-26 MED ORDER — POLYETHYLENE GLYCOL 3350 17 G PO PACK: 17.0000 g | PACK | Freq: Every day | ORAL | Status: DC | PRN

## 2016-06-26 MED ORDER — FINASTERIDE 5 MG PO TABS
5.0000 mg | ORAL_TABLET | Freq: Every day | ORAL | Status: DC
  Administered 2016-06-26 – 2016-06-28 (×3): 5 mg via ORAL
  Filled 2016-06-26 (×3): qty 1

## 2016-06-26 MED ORDER — SUCRALFATE 1 G PO TABS
1.0000 g | ORAL_TABLET | Freq: Three times a day (TID) | ORAL | Status: DC
  Administered 2016-06-26 – 2016-06-28 (×10): 1 g via ORAL
  Filled 2016-06-26 (×10): qty 1

## 2016-06-26 MED ORDER — MAGNESIUM CITRATE PO SOLN: 1.0000 | Freq: Once | ORAL | Status: DC | PRN

## 2016-06-26 MED ORDER — PROCHLORPERAZINE MALEATE 10 MG PO TABS
10.0000 mg | ORAL_TABLET | Freq: Three times a day (TID) | ORAL | Status: DC | PRN
  Administered 2016-06-27: 10 mg via ORAL
  Filled 2016-06-26 (×2): qty 1

## 2016-06-26 MED ORDER — OXYCODONE-ACETAMINOPHEN 5-325 MG PO TABS
0.5000 | ORAL_TABLET | Freq: Four times a day (QID) | ORAL | Status: DC | PRN
  Administered 2016-06-27 (×2): 1 via ORAL
  Filled 2016-06-26 (×2): qty 1

## 2016-06-26 MED ORDER — DOCUSATE SODIUM 100 MG PO CAPS
100.0000 mg | ORAL_CAPSULE | Freq: Two times a day (BID) | ORAL | Status: DC
  Filled 2016-06-26: qty 1

## 2016-06-26 MED ORDER — SENNOSIDES-DOCUSATE SODIUM 8.6-50 MG PO TABS: 1.0000 | ORAL_TABLET | Freq: Every evening | ORAL | Status: DC | PRN

## 2016-06-26 MED ORDER — SODIUM CHLORIDE 0.9 % IV SOLN
INTRAVENOUS | Status: DC
  Administered 2016-06-26 – 2016-06-27 (×3): via INTRAVENOUS

## 2016-06-26 MED ORDER — TAMSULOSIN HCL 0.4 MG PO CAPS
0.4000 mg | ORAL_CAPSULE | Freq: Every day | ORAL | Status: DC
  Administered 2016-06-26 – 2016-06-28 (×3): 0.4 mg via ORAL
  Filled 2016-06-26 (×3): qty 1

## 2016-06-26 MED ORDER — POLYVINYL ALCOHOL 1.4 % OP SOLN
1.0000 [drp] | OPHTHALMIC | Status: DC | PRN
  Filled 2016-06-26: qty 15

## 2016-06-26 MED ORDER — ONDANSETRON HCL 4 MG PO TABS: 4.0000 mg | ORAL_TABLET | Freq: Four times a day (QID) | ORAL | Status: DC | PRN

## 2016-06-26 MED ORDER — DIPHENHYDRAMINE HCL 25 MG PO CAPS: 25.0000 mg | ORAL_CAPSULE | Freq: Every evening | ORAL | Status: DC | PRN

## 2016-06-26 MED ORDER — GI COCKTAIL ~~LOC~~
30.0000 mL | Freq: Once | ORAL | Status: AC
  Administered 2016-06-26: 30 mL via ORAL
  Filled 2016-06-26: qty 30

## 2016-06-26 MED ORDER — OXYCODONE HCL 5 MG PO TABS
5.0000 mg | ORAL_TABLET | ORAL | Status: DC | PRN
  Administered 2016-06-26 – 2016-06-27 (×5): 5 mg via ORAL
  Filled 2016-06-26 (×6): qty 1

## 2016-06-26 MED ORDER — LACTULOSE 10 GM/15ML PO SOLN: 5.0000 g | Freq: Every day | ORAL | Status: DC | PRN

## 2016-06-26 MED ORDER — ONDANSETRON HCL 4 MG/2ML IJ SOLN
4.0000 mg | Freq: Four times a day (QID) | INTRAMUSCULAR | Status: DC | PRN
  Administered 2016-06-26 – 2016-06-27 (×3): 4 mg via INTRAVENOUS
  Filled 2016-06-26 (×3): qty 2

## 2016-06-26 MED ORDER — ACETAMINOPHEN 325 MG PO TABS: 650.0000 mg | ORAL_TABLET | Freq: Four times a day (QID) | ORAL | Status: DC | PRN

## 2016-06-26 MED ORDER — BOOST / RESOURCE BREEZE PO LIQD: 1.0000 | Freq: Three times a day (TID) | ORAL | Status: DC

## 2016-06-26 MED ORDER — PANTOPRAZOLE SODIUM 40 MG PO TBEC
40.0000 mg | DELAYED_RELEASE_TABLET | Freq: Two times a day (BID) | ORAL | Status: DC
  Administered 2016-06-26 – 2016-06-28 (×6): 40 mg via ORAL
  Filled 2016-06-26 (×6): qty 1

## 2016-06-26 NOTE — Plan of Care (Signed)
Problem: Education: Goal: Knowledge of [place] Education information/materials will improve Outcome: Progressing VSS, free of falls during shift.  No BM during shift, denies pain, nausea.  Port accessed, flushes well, blood return noted, infusing NS @75 .  PIV flushes well, saline locked.  No complaints since arriving to unit.  Wife at bedside, call bell within reach.  WCTM.

## 2016-06-26 NOTE — Plan of Care (Signed)
Patient unable to keep fluids down,  States he feels that something is always struck in his throat.  Bedside swallow shows no s/sx of aspiration but will ask SLP to assess and address patient concerns (per patient/family request).

## 2016-06-26 NOTE — Progress Notes (Signed)
Initial Nutrition Assessment  DOCUMENTATION CODES:   Severe malnutrition in context of chronic illness  INTERVENTION:  Provide [place] Chicken Soup po TID with meals, each supplement provides 100 kcal and 21 grams of protein.  Can provide Ensure Enlive, which patient prefers when diet advanced past CLD.  As patient remains severely malnourished over multiple admissions and remains full code/full scope treatment receiving chemotherapy, recommend discussing with patient/family placement of PEG tube for initiation of tube feeding to help meet calorie/protein needs and prevent further weight loss.  NUTRITION DIAGNOSIS:   Inadequate oral intake related to poor appetite, nausea, vomiting, cancer and cancer related treatments as evidenced by per patient/family report.  GOAL:   Patient will meet greater than or equal to 90% of their needs  MONITOR:   PO intake, Supplement acceptance, Diet advancement, Labs, Weight trends, I & O's  REASON FOR ASSESSMENT:   Malnutrition Screening Tool    ASSESSMENT:   76 y.o. male with a known history of end-stage renal disease status post renal transplant (with post-transplant lymphoproliferative disorder), stage IVBE diffuse large B cell lymphoma on chemotherapy, persistent atrial fibrillation, GI bleed, pulmonary hypertension, presents to the emergency department for evaluation of abdominal pain.  Patient was in a usual state of health until 4 days ago when the patient describes the gradual onset of abdominal pain which was described as diffuse, crampy, intermittent, worse after eating. His wife reports that he has also had several episodes of diarrhea which responded to Imodium and nonbloody, nonbilious vomiting. Admitted with gastroenteritis.   -Patient now s/p cycle #3 mini-RCHOP with OnPro Neulasta on 12/29. Biopsy revealing diagnosis of lymphoma occurred 04/30/2016.  Spoke with patient and wife at bedside. Patient reports his appetite is poor now. He  was able to tolerate "greens" on 12/31, but since then has only had 2 Ensure Original daily (220 kcal and 9 grams of protein per bottle) at best. Patient reports prior to 12/31 he was having 2 meals per day and 2-3 Ensure Original daily. He endorses N/V since 1/2 - patient was actively vomiting in room at time of RD assessment. Patient also reports he feels like "something is stuck in his throat." On last admission he had also reported difficulty swallowing to this RD in setting of pain in throat. Denies abdominal pain or constipation/diarrhea, but per HPI patient presented with both. Patient reports he does not like Boost Breeze. As he is on CLD amenable to trying other clear oral nutrition supplements.   UBW 160 lbs. Per chart patient was 160 lbs at the end of May 2017. Patient has lost 38 lbs (24% body weight) over 7 months, which is significant for time frame.   Medications reviewed and include: allopurinol, Colace, multivitamin with minerals daily, pantoprazole, prenisone 5 mg daily with breakfast, tacrolimus 3 mg BID, NS @ 75 ml/hr, Zofran 4 mg Q6hrs PRN.  Labs reviewed: CBG 90-101, Anion gap 3, BUN 32, Sodium 134.  Nutrition-Focused physical exam completed. Findings are severe fat depletion, severe muscle depletion, and no edema. Patient's skin very dry.  Patient meets criteria for severe chronic malnutrition in setting of 24% body weight, severe fat depletion, severe muscle depletion.   Discussed with RN.  Diet Order:  Diet clear liquid Room service appropriate? Yes; Fluid consistency: Thin  Skin:  Reviewed, no issues  Last BM:  06/25/2016  Height:   Ht Readings from Last 1 Encounters:  06/26/16 5\' 6"  (1.676 m)    Weight:   Wt Readings from Last 1 Encounters:  06/26/16 122 lb 8 oz (55.6 kg)    Ideal Body Weight:  64.5 kg  BMI:  Body mass index is 19.77 kg/m.  Estimated Nutritional Needs:   Kcal:  1670-1950 (30-35 kcal/kg)  Protein:  83-95 grams (1.5-1.7  grams/kg)  Fluid:  >/= 1.6 L/day (30 ml/kg)  EDUCATION NEEDS:   Education needs no appropriate at this time  Willey Blade, MS, RD, LDN Pager: [phone] After Hours Pager: [phone]

## 2016-06-26 NOTE — Care Management Obs Status (Signed)
[facility] NOTIFICATION   Patient Details  Name: Matthew Brown MRN: [id number] Date of Birth: 02/17/1941   Medicare Observation Status Notification Given:  Yes    Shelbie Ammons, RN 06/26/2016, 3:21 PM

## 2016-06-26 NOTE — Evaluation (Signed)
Clinical/Bedside Swallow Evaluation Patient Details  Name: Matthew Brown MRN: [id number] Date of Birth: [birth year]  Today's Date: 06/26/2016 Time: SLP Start Time (ACUTE ONLY): 1155 SLP Stop Time (ACUTE ONLY): 1255 SLP Time Calculation (min) (ACUTE ONLY): 60 min  Past Medical History:  Past Medical History:  Diagnosis Date  . Benign prostatic hypertrophy   . Chronic headache 10/19/2015  . ED (erectile dysfunction)   . End stage renal disease ([place])   . Essential hypertension   . GERD (gastroesophageal reflux disease)   . GIB (gastrointestinal bleeding)    a. [id number] s/p R colic artery embolization;  b. 02/2015 EGD: duod ulcerative mass->Bx notable for coagulative necrosis - ? ischemia vs thrombosis-->coumadin d/c'd.  . Gout   . Hearing loss   . Hemorrhoids   . Hyperlipidemia   . Lymphoma ([place])   . Lymphoma ([place]) 2017  . Multiple thyroid nodules 06/06/2016   Noted on carotid US; dedicated US to be ordered by staff  . Osteoarthrosis, unspecified whether generalized or localized, lower leg   . Persistent atrial fibrillation ([place])    a. CHA2DS2VASc = 3-->coumadin d/c'd 02/2015 2/2 recurrent GIB.  [name] Prostatitis   . Pulmonary hypertension    a. 10/2014 Echo: EF 60-65%, mild to mod MR, mildly dil LA, nl RV, PASP 10mmHg.  [name] Renal transplant recipient   . Ulcers of both great toes [facility])    Past Surgical History:  Past Surgical History:  Procedure Laterality Date  . BACK SURGERY    . ESOPHAGOGASTRODUODENOSCOPY  03/13/15   severe esophagitis, ulcerated mass  . HERNIA REPAIR  1974  . PERIPHERAL VASCULAR CATHETERIZATION N/A 05/07/2016   Procedure: Glori Luis Cath Insertion;  Surgeon: Algernon Huxley, MD;  Location: [place] [facility];  Service: Cardiovascular;  Laterality: N/A;  . PROSTATE ABLATION    . STOMACH SURGERY     blood vessel burst  . THROAT SURGERY    . TOTAL KNEE ARTHROPLASTY     HPI:  Pt is a 76 y.o. male with a known history of end-stage renal disease status post renal transplant,  lymphoma on chemotherapy currently, persistent atrial fibrillation, GI bleed, pulmonary hypertension, presents to the emergency department for evaluation of abdominal pain.  Patient was in a usual state of health until 4 days ago when the patient describes the gradual onset of abdominal pain which was described as diffuse, crampy, intermittent, worse after eating. His wife reports that he has also had several episodes of diarrhea which responded to Imodium and nonbloody, nonbilious vomiting. Pt and wife denied any difficulty swallowing or overt s/s of aspiration but did endorse s/s of Reflux and Esophageal dysmotility. Per chart note, pt has scarring of the distal Esophagus per an exam last year. He does c/o a globus feeling especially when swallowing pills.    Assessment / Plan / Recommendation Clinical Impression  Pt was seen for assessment of toleration of po's/oral diet d/t c/o globus feelings when swallowing (moreso w/ pills). Pt has been experiencing episodes of N/V and is currently on a clear liquid diet per GI MD. Pt requested to rest but did discuss his swallowing and engaged in interaction re: education on aspiration precautions and Reflux precautions; education on food/diet consistency and options; education on compensatory strategies such as using applesauce w/ pills and avoiding carbonated sodas during meals. Pt was able to recall the information provided; Wife present and agreed. As pt appears at his baseline w/ toleration of po's w/ no s/s of aspiration reposted by pt/wife and  NSG, recommend continue w/ diet as ordered by GI following general aspiration precautions and Reflux precautions - handouts given. ST services will be available for any further assessment and education as needed while admitted if any change in status. Pt/wife agreed. NSG updated.     Aspiration Risk   (reduced)    Diet Recommendation  recommend a mech soft/regular diet (meats cut well and moistened; use of condiments w/  foods to moisten), Thin liquids. General aspiration and Reflux precautions w/ po's.   Medication Administration: Whole meds with puree (if needed for easier swallowing and clearing)    Other  Recommendations Recommended Consults: Consider GI evaluation (for f/u assessment and tx as indicated; Dietician f/u) Oral Care Recommendations: Oral care BID;Staff/trained caregiver to provide oral care   Follow up Recommendations None      Frequency and Duration            Prognosis Prognosis for Safe Diet Advancement: Good      Swallow Study   General Date of Onset: 06/25/16 HPI: Pt is a 76 y.o. male with a known history of end-stage renal disease status post renal transplant, lymphoma on chemotherapy currently, persistent atrial fibrillation, GI bleed, pulmonary hypertension, presents to the emergency department for evaluation of abdominal pain.  Patient was in a usual state of health until 4 days ago when the patient describes the gradual onset of abdominal pain which was described as diffuse, crampy, intermittent, worse after eating. His wife reports that he has also had several episodes of diarrhea which responded to Imodium and nonbloody, nonbilious vomiting. Pt and wife denied any difficulty swallowing or overt s/s of aspiration but did endorse s/s of Reflux and Esophageal dysmotility. Per chart note, pt has scarring of the distal Esophagus per an exam last year. He does c/o a globus feeling especially when swallowing pills.  Type of Study: Bedside Swallow Evaluation Previous Swallow Assessment: none indicated Diet Prior to this Study: Regular;Thin liquids (softs foods at home; currently a clear liquid diet d/t GI) Temperature Spikes Noted: No (wbc 13.9 down from 32) Respiratory Status: Room air History of Recent Intubation: No Behavior/Cognition: Alert;Cooperative;Pleasant mood Oral Cavity Assessment: Within Functional Limits Oral Care Completed by SLP: Recent completion by staff Oral  Cavity - Dentition: Edentulous Vision: Functional for self-feeding Self-Feeding Abilities: Able to feed self Patient Positioning:  (resting in bed post bathroom ) Baseline Vocal Quality: Normal Volitional Cough: Strong Volitional Swallow: Able to elicit    Oral/Motor/Sensory Function Overall Oral Motor/Sensory Function: Within functional limits   Ice Chips Ice chips: Not tested   Thin Liquid Thin Liquid: Not tested    Nectar Thick Nectar Thick Liquid: Not tested   Honey Thick Honey Thick Liquid: Not tested   Puree Puree: Not tested   Solid   GO   Solid: Not tested    Functional Assessment Tool Used: clinical judgement Functional Limitations: Swallowing Swallow Current Status [id number]): At least 1 percent but less than 20 percent impaired, limited or restricted Swallow Goal Status [phone]): At least 1 percent but less than 20 percent impaired, limited or restricted Swallow Discharge Status [phone]): At least 1 percent but less than 20 percent impaired, limited or restricted    Orinda Kenner, MS, CCC-SLP Watson,Katherine 06/26/2016,3:45 PM

## 2016-06-26 NOTE — Progress Notes (Signed)
PT Cancellation Note  Patient Details Name: Matthew Brown MRN: [id number] DOB: 1941/03/19   Cancelled Treatment:    Reason Eval/Treat Not Completed: Pain limiting ability to participate.  Pt reporting 8-9/10 abdominal pain (pt curled up in bed) and reporting unable to participate in PT session d/t pain.  Nursing notified immediately regarding pt's level of pain and request for pain meds.  Will re-attempt PT eval at a later date/time as able.   Raquel Sarna Hardy Harcum 06/26/2016, 9:30 AM Leitha Bleak, [place]

## 2016-06-26 NOTE — Care Management (Addendum)
Admitted to [facility] with the diagnosis of gastroenteritis under observation status. Lives with wife, Lelon Frohlich [phone] or [phone]). Last seen Dr. Sanda Klein 2 months ago. Home health in the past, doesn't remember name of agency. [facility] in 2016. No home oxygen. Prescriptions are filled at [facility]. Takes care of all basic activities of daily living himself, hasn't driven in 3.5 months. Last 28 pounds since October. No falls. Wife will transport. Shelbie Ammons RN MSN CCM Care Management

## 2016-06-26 NOTE — Progress Notes (Signed)
[place] [facility] [place] NAME: Matthew Brown    MR#:  [id number]  DATE OF BIRTH:  April 25, 1941  SUBJECTIVE:   Patient here due to abdominal pain and diarrhea.  Feels better today.  No further N/V today. Tolerating clear liquids but poor PO intake.   REVIEW OF SYSTEMS:    Review of Systems  Constitutional: Negative for chills and fever.  HENT: Negative for congestion and tinnitus.   Eyes: Negative for blurred vision and double vision.  Respiratory: Negative for cough, shortness of breath and wheezing.   Cardiovascular: Negative for chest pain, orthopnea and PND.  Gastrointestinal: Positive for abdominal pain and diarrhea. Negative for nausea and vomiting.  Genitourinary: Negative for dysuria and hematuria.  Neurological: Negative for dizziness, sensory change and focal weakness.  All other systems reviewed and are negative.   Nutrition: Clear liquids Tolerating Diet: Yes but little.  Tolerating PT: Await Eval.      DRUG ALLERGIES:  No Known Allergies  VITALS:  Blood pressure (!) 127/50, pulse 64, temperature 97.6 F (36.4 C), temperature source Oral, resp. rate 16, height 5\' 6"  (1.676 m), weight 55.6 kg (122 lb 8 oz), SpO2 96 %.  PHYSICAL EXAMINATION:   Physical Exam  GENERAL:  76 y.o.-year-old cachectic patient lying in the bed in no acute distress.  EYES: Pupils equal, round, reactive to light and accommodation. No scleral icterus. Extraocular muscles intact.  HEENT: Head atraumatic, normocephalic. Oropharynx and nasopharynx clear.  NECK:  Supple, no jugular venous distention. No thyroid enlargement, no tenderness.  LUNGS: Normal breath sounds bilaterally, no wheezing, rales, rhonchi. No use of accessory muscles of respiration.  CARDIOVASCULAR: S1, S2 normal. No murmurs, rubs, or gallops.  ABDOMEN: Soft, nontender, nondistended. Bowel sounds present. No organomegaly or mass.  EXTREMITIES: No cyanosis, clubbing or edema b/l.     NEUROLOGIC: Cranial nerves II through XII are intact. No focal Motor or sensory deficits b/l.   PSYCHIATRIC: The patient is alert and oriented x 3.  SKIN: No obvious rash, lesion, or ulcer.    LABORATORY PANEL:   CBC  Recent Labs Lab 06/26/16 0439  WBC 13.9*  HGB 9.1*  HCT 27.4*  PLT 92*   ------------------------------------------------------------------------------------------------------------------  Chemistries   Recent Labs Lab 06/25/16 1739 06/26/16 0439  NA 133* 134*  K 4.4 4.3  CL 98* 102  CO2 29 29  GLUCOSE 214* 122*  BUN 34* 32*  CREATININE 0.84 0.78  CALCIUM 10.3 9.1  AST 27  --   ALT 31  --   ALKPHOS 145*  --   BILITOT 1.3*  --    ------------------------------------------------------------------------------------------------------------------  Cardiac Enzymes No results for input(s): TROPONINI in the last 168 hours. ------------------------------------------------------------------------------------------------------------------  RADIOLOGY:  Ct Abdomen Pelvis Wo Contrast  Result Date: 06/25/2016 CLINICAL DATA:  Lymphoma on chemotherapy. Treated 1 week ago now complaining of lower abdominal pain with diarrhea, nausea and vomiting. EXAM: CT ABDOMEN AND PELVIS WITHOUT CONTRAST TECHNIQUE: Multidetector CT imaging of the abdomen and pelvis was performed following the standard protocol without IV contrast. COMPARISON:  06/13/2016 CT lumbar spine, PET-CT 04/28/2016 FINDINGS: Lower chest: Cardiomegaly with coronary arteriosclerosis. Port catheter tip in the cavoatrial junction. Small bilateral pleural effusions right greater than left with adjacent atelectasis. Minimal lingular atelectasis. Thoracic aortic atherosclerosis. Hepatobiliary: The unenhanced liver demonstrates no focal mass. Punctate calcification in the right hepatic lobe possibly a small granuloma or vascular calcification. Gallbladder does not appear to be distended. No gallstones are seen. Pancreas:  Atrophic pancreas.  Spleen: No splenomegaly. Adrenals/Urinary Tract: Normal appearing bilateral adrenal glands. Atrophic bilateral kidneys with renovascular calcifications bilaterally. Small cortical and exophytic lesions are noted on the right, measuring between 13 and 16 mm. The exophytic lesion is not simple by CT may represent a complex or proteinaceous cyst. Further characterization is not possible given lack of IV contrast. Pelvic right kidney is noted. Stomach/Bowel: Contrast distended slightly thickened stomach with contrast filled mildly thickened proximal small bowel loops consistent with gastroenteritis. Oral contrast reaches large bowel. Normal-appearing appendix. Descending colonic and sigmoid diverticulosis without acute diverticulitis. Vascular/Lymphatic: The mesenteric and retroperitoneal periaortic adenopathy appears to have slightly decreased in size since prior PET-CT from 04/28/2016. Right para-aortic lymph node measured at the same level previously now measures 1.6 cm in thickness versus 3.2 cm previously. Left para- aortic adenopathy now measures approximately 3.8 cm versus 5 cm previously. Mesenteric adenopathy now measures 1.7 cm versus 2.5 cm. Reproductive: Normal sized partially calcified prostate. Left inguinal surgical clips are present. Other: No free air or free fluid. Musculoskeletal: Solid osseous fusion of the posterior elements from L3 through S1 with cerclage wires in place. Degenerative disc disease with height loss from L1 through L3. Subtle heterogeneity of the L3 through L5 vertebral bodies as previously described which cannot exclude lytic disease. Osteoarthritis of hips with subchondral cysts noted about both hips. Lytic lesion of the left sacrum with soft tissue component. IMPRESSION: Fluid-filled distended and slightly thickened appearance of the stomach and proximal small bowel consistent with gastroenteritis. No bowel obstruction noted. Interval decrease in size of  retroperitoneal and mesenteric lymphadenopathy. Small bilateral pleural effusions right greater than left. Right transplanted pelvic kidney with atrophic bilateral native kidneys. Small stable right-sided renal cysts. Lytic lucencies of L3, L4 and L5 and the left sacrum consistent osteolytic metastatic disease. Electronically Signed   By: Ashley Royalty M.D.   On: 06/25/2016 21:55     ASSESSMENT AND PLAN:   76 year old male with past medical history of renal transplant, recently diagnosed with lymphoma started on chemotherapy, hyperlipidemia, GERD, BPH, presents history of GI bleed who presents to the hospital due to abdominal pain, diarrhea.  1. Abdominal pain/diarrhea-secondary to gastroenteritis. Patient's C. difficile antigen is positive but the PCR is negative. -Continue supportive care with clear liquid diet, IV fluids, antiemetics. Patient feels a bit better today. - pt. Could also have diarrhea due to recent chemo for his Lymphoma.   2. Leukocytosis-much improved since yesterday. Likely secondary to the gastroenteritis and will continue to monitor. Hold off on antibiotics.  3. Thrombocytopenia-no evidence of acute bleeding. Likely secondary to underlying recent chemotherapy. Will follow counts. Next  4. History of renal transplant-continue Prograf, prednisone  5. Hx of GOut - no acute attack.  - cont. Allopurinol.   6. BPH - cont. Finasteride, Flomax.   7. GERD - cont. Protonix, Carafate.   8. Severe Protein-Calorie malnutrition - discussed w/ Dietary and pt. Not meeting his caloric needs even with supplement. ?? Need for PEG and will discuss with family and pt. Tomorrow.   All the records are reviewed and case discussed with Care Management/Social Worker. Management plans discussed with the patient, family and they are in agreement.  CODE STATUS: Full   DVT Prophylaxis: Lovenox  TOTAL TIME TAKING CARE OF THIS PATIENT: 30 minutes.   POSSIBLE D/C IN 1-2 DAYS, DEPENDING ON  CLINICAL CONDITION.   Henreitta Leber M.D on 06/26/2016 at 3:18 PM  Between 7am to 6pm - Pager - [phone]  After 6pm go to [email or web address] -  password Airline pilot  [facility]  Office  [phone]  CC: Primary care physician; Enid Derry, MD

## 2016-06-27 LAB — GLUCOSE, CAPILLARY
GLUCOSE-CAPILLARY: 87 mg/dL (ref 65–99)
GLUCOSE-CAPILLARY: 100 mg/dL — AB (ref 65–99)
GLUCOSE-CAPILLARY: 109 mg/dL — AB (ref 65–99)
Glucose-Capillary: 85 mg/dL (ref 65–99)

## 2016-06-27 LAB — BASIC METABOLIC PANEL
ANION GAP: 4 — AB (ref 5–15)
BUN: 33 mg/dL — ABNORMAL HIGH (ref 6–20)
CHLORIDE: 109 mmol/L (ref 101–111)
CO2: 23 mmol/L (ref 22–32)
Calcium: 8.6 mg/dL — ABNORMAL LOW (ref 8.9–10.3)
Creatinine, Ser: 0.87 mg/dL (ref 0.61–1.24)
GFR calc non Af Amer: >60 mL/min (ref >60)
GLUCOSE: 103 mg/dL — AB (ref 65–99)
POTASSIUM: 3.5 mmol/L (ref 3.5–5.1)
Sodium: 136 mmol/L (ref 135–145)

## 2016-06-27 NOTE — Plan of Care (Signed)
Problem: Education: Goal: Knowledge of [facility] Education information/materials will improve Outcome: Progressing Pt reports continued abd pain, nausea, diarrhea.  No emesis during shift.  Received PRN IV Zofran 4mg  x1 for nausea.  Received PRN Oxy 5mg  x2, PRN Percocet 5mg  x1 for abd pain.  Port dsg clean, dry, intact.  Wife at bedside, call bell within reach.  WCTM.

## 2016-06-27 NOTE — Progress Notes (Signed)
PT Cancellation Note  Patient Details Name: Matthew Brown MRN: [id number] DOB: 10/12/1940   Cancelled Treatment:    Reason Eval/Treat Not Completed: Fatigue/lethargy limiting ability to participate;Pain limiting ability to participate.  Attempted PT eval but upon PT arrival to pt's room NT was leaving room and reporting pt was exhausted coming back from the bathroom with her just now.  Pt declining PT and reporting that he was too fatigued to participate and had too much abdominal pain to participate (9/10); nursing notified of all of this.  Will re-attempt PT eval at a later date/time.   Raquel Sarna Simran Mannis 06/27/2016, 11:17 AM Leitha Bleak, [place]

## 2016-06-27 NOTE — Progress Notes (Signed)
Patient given pain and nausea medication prior to meal consumption. Patient able to eat 75 percent of meal with no complications. Madlyn Frankel, RN

## 2016-06-27 NOTE — Progress Notes (Signed)
[facility] [place] NAME: Matthew Brown    MR#:  [id number]  DATE OF BIRTH:  1941/03/20  SUBJECTIVE:   Diarrhea has improved. C. Diff PCR was (-) yesterday.  Still having some vague abdominal pain while attempting to eat.    REVIEW OF SYSTEMS:    Review of Systems  Constitutional: Negative for chills and fever.  HENT: Negative for congestion and tinnitus.   Eyes: Negative for blurred vision and double vision.  Respiratory: Negative for cough, shortness of breath and wheezing.   Cardiovascular: Negative for chest pain, orthopnea and PND.  Gastrointestinal: Positive for abdominal pain and diarrhea. Negative for nausea and vomiting.  Genitourinary: Negative for dysuria and hematuria.  Neurological: Negative for dizziness, sensory change and focal weakness.  All other systems reviewed and are negative.   Nutrition: regular Tolerating Diet: Yes but little.  Tolerating PT: Await Eval.      DRUG ALLERGIES:  No Known Allergies  VITALS:  Blood pressure (!) 112/46, pulse 63, temperature 97.4 F (36.3 C), temperature source Oral, resp. rate 16, height 5\' 6"  (1.676 m), weight 55.6 kg (122 lb 8 oz), SpO2 96 %.  PHYSICAL EXAMINATION:   Physical Exam  GENERAL:  76 y.o.-year-old cachectic patient lying in the bed in no acute distress.  EYES: Pupils equal, round, reactive to light and accommodation. No scleral icterus. Extraocular muscles intact.  HEENT: Head atraumatic, normocephalic. Oropharynx and nasopharynx clear.  NECK:  Supple, no jugular venous distention. No thyroid enlargement, no tenderness.  LUNGS: Normal breath sounds bilaterally, no wheezing, rales, rhonchi. No use of accessory muscles of respiration.  CARDIOVASCULAR: S1, S2 normal. No murmurs, rubs, or gallops.  ABDOMEN: Soft, nontender, nondistended. Bowel sounds present. No organomegaly or mass.  EXTREMITIES: No cyanosis, clubbing or edema b/l.    NEUROLOGIC: Cranial nerves II  through XII are intact. No focal Motor or sensory deficits b/l.   PSYCHIATRIC: The patient is alert and oriented x 3.  SKIN: No obvious rash, lesion, or ulcer.    LABORATORY PANEL:   CBC  Recent Labs Lab 06/26/16 0439  WBC 13.9*  HGB 9.1*  HCT 27.4*  PLT 92*   ------------------------------------------------------------------------------------------------------------------  Chemistries   Recent Labs Lab 06/25/16 1739  06/27/16 0500  NA 133*  < > 136  K 4.4  < > 3.5  CL 98*  < > 109  CO2 29  < > 23  GLUCOSE 214*  < > 103*  BUN 34*  < > 33*  CREATININE 0.84  < > 0.87  CALCIUM 10.3  < > 8.6*  AST 27  --   --   ALT 31  --   --   ALKPHOS 145*  --   --   BILITOT 1.3*  --   --   < > = values in this interval not displayed. ------------------------------------------------------------------------------------------------------------------  Cardiac Enzymes No results for input(s): TROPONINI in the last 168 hours. ------------------------------------------------------------------------------------------------------------------  RADIOLOGY:  Ct Abdomen Pelvis Wo Contrast  Result Date: 06/25/2016 CLINICAL DATA:  Lymphoma on chemotherapy. Treated 1 week ago now complaining of lower abdominal pain with diarrhea, nausea and vomiting. EXAM: CT ABDOMEN AND PELVIS WITHOUT CONTRAST TECHNIQUE: Multidetector CT imaging of the abdomen and pelvis was performed following the standard protocol without IV contrast. COMPARISON:  06/13/2016 CT lumbar spine, PET-CT 04/28/2016 FINDINGS: Lower chest: Cardiomegaly with coronary arteriosclerosis. Port catheter tip in the cavoatrial junction. Small bilateral pleural effusions right greater than left with adjacent atelectasis. Minimal lingular  atelectasis. Thoracic aortic atherosclerosis. Hepatobiliary: The unenhanced liver demonstrates no focal mass. Punctate calcification in the right hepatic lobe possibly a small granuloma or vascular calcification.  Gallbladder does not appear to be distended. No gallstones are seen. Pancreas: Atrophic pancreas. Spleen: No splenomegaly. Adrenals/Urinary Tract: Normal appearing bilateral adrenal glands. Atrophic bilateral kidneys with renovascular calcifications bilaterally. Small cortical and exophytic lesions are noted on the right, measuring between 13 and 16 mm. The exophytic lesion is not simple by CT may represent a complex or proteinaceous cyst. Further characterization is not possible given lack of IV contrast. Pelvic right kidney is noted. Stomach/Bowel: Contrast distended slightly thickened stomach with contrast filled mildly thickened proximal small bowel loops consistent with gastroenteritis. Oral contrast reaches large bowel. Normal-appearing appendix. Descending colonic and sigmoid diverticulosis without acute diverticulitis. Vascular/Lymphatic: The mesenteric and retroperitoneal periaortic adenopathy appears to have slightly decreased in size since prior PET-CT from 04/28/2016. Right para-aortic lymph node measured at the same level previously now measures 1.6 cm in thickness versus 3.2 cm previously. Left para- aortic adenopathy now measures approximately 3.8 cm versus 5 cm previously. Mesenteric adenopathy now measures 1.7 cm versus 2.5 cm. Reproductive: Normal sized partially calcified prostate. Left inguinal surgical clips are present. Other: No free air or free fluid. Musculoskeletal: Solid osseous fusion of the posterior elements from L3 through S1 with cerclage wires in place. Degenerative disc disease with height loss from L1 through L3. Subtle heterogeneity of the L3 through L5 vertebral bodies as previously described which cannot exclude lytic disease. Osteoarthritis of hips with subchondral cysts noted about both hips. Lytic lesion of the left sacrum with soft tissue component. IMPRESSION: Fluid-filled distended and slightly thickened appearance of the stomach and proximal small bowel consistent with  gastroenteritis. No bowel obstruction noted. Interval decrease in size of retroperitoneal and mesenteric lymphadenopathy. Small bilateral pleural effusions right greater than left. Right transplanted pelvic kidney with atrophic bilateral native kidneys. Small stable right-sided renal cysts. Lytic lucencies of L3, L4 and L5 and the left sacrum consistent osteolytic metastatic disease. Electronically Signed   By: Ashley Royalty M.D.   On: 06/25/2016 21:55     ASSESSMENT AND PLAN:   76 year old male with past medical history of renal transplant, recently diagnosed with lymphoma started on chemotherapy, hyperlipidemia, GERD, BPH, presents history of GI bleed who presents to the hospital due to abdominal pain, diarrhea.  1. Abdominal pain/diarrhea-secondary to gastroenteritis. Patient's C. difficile antigen is positive but PCR was negative.  D/c contact precautions.  -Continue supportive care IV fluids, antiemetics. Diarrhea improved.  Likely related to chemo/viral gastroenteritis.  - tolerating clear liquids and will advance diet and monitor.   2. Leukocytosis- improving and Likely secondary to the gastroenteritis and will continue to monitor. Hold off on antibiotics.  3. Thrombocytopenia-no evidence of acute bleeding. Likely secondary to underlying recent chemotherapy. Will follow counts. No acute bleeding  4. History of renal transplant-continue Prograf, prednisone  5. Hx of GOut - no acute attack.  - cont. Allopurinol.   6. BPH - cont. Finasteride, Flomax.   7. GERD - cont. Protonix, Carafate.   8. Severe Protein-Calorie malnutrition - discussed w/ Dietary and pt. Not meeting his caloric needs even with supplement.  Discussed w/ pt's and wife about possible need for PEG and they will think about it.   All the records are reviewed and case discussed with Care Management/Social Worker. Management plans discussed with the patient, family and they are in agreement.  CODE STATUS: Full   DVT  Prophylaxis:  Lovenox  TOTAL TIME TAKING CARE OF THIS PATIENT: 30 minutes.   POSSIBLE D/C IN 1-2 DAYS, DEPENDING ON CLINICAL CONDITION.   Henreitta Leber M.D on 06/27/2016 at 2:56 PM  Between 7am to 6pm - Pager - [phone]  After 6pm go to [email or web address] - Proofreader  [facility]  Office  [phone]  CC: Primary care physician; Enid Derry, MD

## 2016-06-28 LAB — GLUCOSE, CAPILLARY
GLUCOSE-CAPILLARY: 82 mg/dL (ref 65–99)
GLUCOSE-CAPILLARY: 121 mg/dL — AB (ref 65–99)

## 2016-06-28 MED ORDER — SIMETHICONE 80 MG PO CHEW: 80.0000 mg | CHEWABLE_TABLET | Freq: Four times a day (QID) | ORAL | 0 refills | Status: AC | PRN

## 2016-06-28 MED ORDER — PANTOPRAZOLE SODIUM 40 MG PO TBEC: 40.0000 mg | DELAYED_RELEASE_TABLET | Freq: Two times a day (BID) | ORAL | 0 refills | Status: DC

## 2016-06-28 MED ORDER — HEPARIN SOD (PORK) LOCK FLUSH 100 UNIT/ML IV SOLN
500.0000 [IU] | Freq: Once | INTRAVENOUS | Status: AC
  Administered 2016-06-28: 500 [IU] via INTRAVENOUS
  Filled 2016-06-28: qty 5

## 2016-06-28 NOTE — Progress Notes (Signed)
Received MD order to discharge patient to home, reviewed home meds, prescriptions and discharge instructions with patient and wife and both verbalized understanding discharge to home with nursing in wheelchair

## 2016-06-28 NOTE — Discharge Summary (Signed)
[facility] [place] NAME: Matthew Brown    MR#:  [id number]  DATE OF BIRTH:  January 20, 1941  DATE OF ADMISSION:  06/25/2016 ADMITTING PHYSICIAN: Harvie Bridge, DO  DATE OF DISCHARGE: 06/28/2016  1:25 PM  PRIMARY CARE PHYSICIAN: Enid Derry, MD    ADMISSION DIAGNOSIS:  Gastroenteritis [K52.9]  DISCHARGE DIAGNOSIS:  Active Problems:   Gastroenteritis   SECONDARY DIAGNOSIS:   Past Medical History:  Diagnosis Date  . Benign prostatic hypertrophy   . Chronic headache 10/19/2015  . ED (erectile dysfunction)   . End stage renal disease ([place])   . Essential hypertension   . GERD (gastroesophageal reflux disease)   . GIB (gastrointestinal bleeding)    a. [id number] s/p R colic artery embolization;  b. 02/2015 EGD: duod ulcerative mass->Bx notable for coagulative necrosis - ? ischemia vs thrombosis-->coumadin d/c'd.  . Gout   . Hearing loss   . Hemorrhoids   . Hyperlipidemia   . Lymphoma ([place])   . Lymphoma ([place]) 2017  . Multiple thyroid nodules 06/06/2016   Noted on carotid US; dedicated US to be ordered by staff  . Osteoarthrosis, unspecified whether generalized or localized, lower leg   . Persistent atrial fibrillation ([place])    a. CHA2DS2VASc = 3-->coumadin d/c'd 02/2015 2/2 recurrent GIB.  [name] Prostatitis   . Pulmonary hypertension    a. 10/2014 Echo: EF 60-65%, mild to mod MR, mildly dil LA, nl RV, PASP 22mmHg.  [name] Renal transplant recipient   . Ulcers of both great toes ([place])     HOSPITAL COURSE:   1. Abdominal pain and diarrhea. This had resolved. Likely viral gastroenteritis versus chemotherapy-induced diarrhea. Patient was feeling well at the time of discharge and tolerating diet. Stool for C. difficile was negative. 2. Leukocytosis this has improved. 3. Thrombocytopenia secondary to underlying chemotherapy 4. History of renal transplant on immunosuppressives 5. Gout history on allopurinol 6. BPH on finasteride and Flomax 7. GERD on  Protonix and Carafate 8. Severe protein calorie malnutrition. Overall prognosis is poor with this factor. Dr. Verdell Carmine discussed potential PEG yesterday with the patient and family 9. Home health physical therapy and speech therapy ordered  DISCHARGE CONDITIONS:   Satisfactory  CONSULTS OBTAINED:   none  DRUG ALLERGIES:  No Known Allergies  DISCHARGE MEDICATIONS:   Discharge Medication List as of 06/28/2016 12:09 PM    CONTINUE these medications which have CHANGED   Details  pantoprazole (PROTONIX) 40 MG tablet Take 1 tablet (40 mg total) by mouth 2 (two) times daily., Starting Sat 06/28/2016, Print    simethicone (MYLICON) 80 MG chewable tablet Chew 1 tablet (80 mg total) by mouth every 6 (six) hours as needed for flatulence., Starting Sat 06/28/2016, Print      CONTINUE these medications which have NOT CHANGED   Details  acetaminophen (TYLENOL) 325 MG tablet Take 2 tablets (650 mg total) by mouth every 6 (six) hours as needed for mild pain (or Fever >/= 101)., Starting Tue 05/13/2016, OTC    allopurinol (ZYLOPRIM) 100 MG tablet Take 100 mg by mouth daily.  , Historical Med    COLCRYS 0.6 MG tablet Take 1 tablet by mouth 2 (two) times daily as needed., Starting Sat 05/03/2016, Historical Med    diphenhydrAMINE (BENADRYL) 25 mg capsule Take 1 capsule (25 mg total) by mouth at bedtime as needed for sleep., Starting Tue 05/13/2016, OTC    feeding supplement, ENSURE ENLIVE, (ENSURE ENLIVE) LIQD Take 237 mLs by mouth 3 (three) times  daily between meals., Starting Tue 05/13/2016, Normal    finasteride (PROSCAR) 5 MG tablet Take 5 mg by mouth daily.  , Historical Med    furosemide (LASIX) 80 MG tablet Take 1 tablet by mouth daily as needed., Starting Tue 05/06/2016, Historical Med    hydroxypropyl methylcellulose (ISOPTO TEARS) 2.5 % ophthalmic solution Place 1 drop into both eyes as needed. , Historical Med    lactulose (CHRONULAC) 10 GM/15ML solution Take 7.5-15 mLs (5-10 g total) by  mouth daily as needed for mild constipation., Starting Wed 11/14/2015, Normal    Multiple Vitamin (MULTIVITAMIN) tablet Take 1 tablet by mouth daily.  , Historical Med    ondansetron (ZOFRAN) 4 MG tablet Take 1 tablet (4 mg total) by mouth every 6 (six) hours as needed for nausea., Starting Thu 05/22/2016, Normal    oxybutynin (DITROPAN-XL) 5 MG 24 hr tablet Take 5 mg by mouth daily., Historical Med    oxyCODONE-acetaminophen (ROXICET) 5-325 MG tablet Take 0.5-1 tablets by mouth every 6 (six) hours as needed for severe pain., Starting Mon 05/26/2016, Print    !! predniSONE (DELTASONE) 5 MG tablet Take 5 mg by mouth daily. , Starting Wed 11/23/2013, Historical Med    prochlorperazine (COMPAZINE) 10 MG tablet Take 1 tablet by mouth daily as needed., Starting Sat 05/17/2016, Historical Med    sucralfate (CARAFATE) 1 g tablet Take 1 tablet (1 g total) by mouth 4 (four) times daily. Resume taking after one week- once finished taking oral levaquine. ( to avoid interaction.), Starting Thu 05/22/2016, Normal    tacrolimus (PROGRAF) 1 MG capsule Take 3 mg by mouth 2 (two) times daily. Reported on 08/16/2015, Historical Med    Tamsulosin HCl (FLOMAX) 0.4 MG CAPS Take 0.4 mg by mouth daily.  , Historical Med    albuterol (PROAIR HFA) 108 (90 BASE) MCG/ACT inhaler Inhale 1-2 puffs into the lungs every 4 (four) hours as needed. , Starting Mon 02/20/2014, Historical Med    HYDROcodone-acetaminophen (NORCO) 5-325 MG tablet Take 1 tablet by mouth every 4 (four) hours as needed for moderate pain., Starting Fri 05/02/2016, Print    !! predniSONE (DELTASONE) 20 MG tablet Take 70 mg (3.5 pills) daily for 5 days after each cycle of chemotherapy., Normal     !! - Potential duplicate medications found. Please discuss with provider.    STOP taking these medications     docusate sodium (COLACE) 100 MG capsule      polyethylene glycol (MIRALAX / GLYCOLAX) packet          DISCHARGE INSTRUCTIONS:   Follow-up  with PMD one week Follow-up with Dr. Mike Gip oncology as scheduled  If you experience worsening of your admission symptoms, develop shortness of breath, life threatening emergency, suicidal or homicidal thoughts you must seek medical attention immediately by calling 911 or calling your MD immediately  if symptoms less severe.  You Must read complete instructions/literature along with all the possible adverse reactions/side effects for all the Medicines you take and that have been prescribed to you. Take any new Medicines after you have completely understood and accept all the possible adverse reactions/side effects.   Please note  You were cared for by a hospitalist during your hospital stay. If you have any questions about your discharge medications or the care you received while you were in the hospital after you are discharged, you can call the unit and asked to speak with the hospitalist on call if the hospitalist that took care of you is not available.  Once you are discharged, your primary care physician will handle any further medical issues. Please note that NO REFILLS for any discharge medications will be authorized once you are discharged, as it is imperative that you return to your primary care physician (or establish a relationship with a primary care physician if you do not have one) for your aftercare needs so that they can reassess your need for medications and monitor your lab values.    Today   CHIEF COMPLAINT:   Chief Complaint  Patient presents with  . Abdominal Pain    HISTORY OF PRESENT ILLNESS:  Charod Linquist  is a 76 y.o. male presented with diarrhea and abdominal pain   VITAL SIGNS:  Blood pressure (!) 112/52, pulse 65, temperature 97.9 F (36.6 C), temperature source Oral, resp. rate 17, height 5\' 6"  (1.676 m), weight 55.6 kg (122 lb 8 oz), SpO2 99 %.    PHYSICAL EXAMINATION:  GENERAL:  76 y.o.-year-old patient lying in the bed with no acute distress.  EYES:  Pupils equal, round, reactive to light and accommodation. No scleral icterus. Extraocular muscles intact.  HEENT: Head atraumatic, normocephalic. Oropharynx and nasopharynx clear.  NECK:  Supple, no jugular venous distention. No thyroid enlargement, no tenderness.  LUNGS: Normal breath sounds bilaterally, no wheezing, rales,rhonchi or crepitation. No use of accessory muscles of respiration.  CARDIOVASCULAR: S1, S2 normal. No murmurs, rubs, or gallops.  ABDOMEN: Soft, non-tender, non-distended. Bowel sounds present. No organomegaly or mass.  EXTREMITIES: No pedal edema, cyanosis, or clubbing.  NEUROLOGIC: Cranial nerves II through XII are intact. Muscle strength 5/5 in all extremities. Sensation intact. Gait not checked.  PSYCHIATRIC: The patient is alert and oriented x 3.  SKIN: No obvious rash, lesion, or ulcer.   DATA REVIEW:   CBC  Recent Labs Lab 06/26/16 0439  WBC 13.9*  HGB 9.1*  HCT 27.4*  PLT 92*    Chemistries   Recent Labs Lab 06/25/16 1739  06/27/16 0500  NA 133*  < > 136  K 4.4  < > 3.5  CL 98*  < > 109  CO2 29  < > 23  GLUCOSE 214*  < > 103*  BUN 34*  < > 33*  CREATININE 0.84  < > 0.87  CALCIUM 10.3  < > 8.6*  AST 27  --   --   ALT 31  --   --   ALKPHOS 145*  --   --   BILITOT 1.3*  --   --   < > = values in this interval not displayed.   Microbiology Results  Results for orders placed or performed during the hospital encounter of 06/25/16  C difficile quick scan w PCR reflex     Status: Abnormal   Collection Time: 06/25/16 11:49 AM  Result Value Ref Range Status   C Diff antigen POSITIVE (A) NEGATIVE Final   C Diff toxin NEGATIVE NEGATIVE Final   C Diff interpretation Results are indeterminate. See PCR results.  Final  Clostridium Difficile by PCR     Status: None   Collection Time: 06/25/16 11:49 AM  Result Value Ref Range Status   Toxigenic C Difficile by pcr NEGATIVE NEGATIVE Final    Comment: Patient is colonized with non toxigenic C.  difficile. May not need treatment unless significant symptoms are present.      Management plans discussed with the patient, family and They are in agreement.  CODE STATUS:  Code Status History    Date Active Date Inactive  Code Status Order ID Comments User Context   06/26/2016  1:52 AM 06/28/2016  5:29 PM Full Code [id number]  Harvie Bridge, DO Inpatient   05/18/2016  3:45 PM 05/22/2016  9:09 PM Full Code [id number]  Demetrios Loll, MD Inpatient   05/06/2016  4:23 PM 05/13/2016 11:03 PM Full Code [id number]  Dustin Flock, MD Inpatient   04/29/2016  1:24 AM 04/30/2016  9:35 PM Full Code [id number]  Lance Coon, MD Inpatient    Advance Directive Documentation   Flowsheet Row Most Recent Value  Type of Advance Directive  Healthcare Power of Attorney  Pre-existing out of facility DNR order (yellow form or pink MOST form)  No data  "MOST" Form in Place?  No data      TOTAL TIME TAKING CARE OF THIS PATIENT: 35 minutes.    Loletha Grayer M.D on 06/28/2016 at 6:27 PM  Between 7am to 6pm - Pager - [phone]  After 6pm go to [email or web address] - password [email or web address]  [facility] Office  [phone]  CC: Primary care physician; Enid Derry, MD

## 2016-06-28 NOTE — Evaluation (Deleted)
Physical Therapy Evaluation Patient Details Name: Matthew Brown MRN: [id number] DOB: 1941/02/29 Today's Date: 06/28/2016   History of Present Illness  76 yo male with onset of gastroenteritis and leukocytosis, abd pain and diarrhea, cleared for c-diff, thrombocytopenia now referred to PT for triage for home.  PMHx:  GERD, malnutrition, renal transplant, lumbar fusion,   Clinical Impression  Pt is up to walk with PT after discussing PLOF, and demonstrates a minimally weakened presentation of his previous level.  He has been unable to eat and is very cachectic, probably with significant mm wasting.  He is appropriate to follow acutely if DC is delayed and will request HHPT to follow for strengthening and increasing his balance with gait on SPC.  Very pleasant and cooperative gentleman.    Follow Up Recommendations Home health PT;Supervision for mobility/OOB    Equipment Recommendations  None recommended by PT    Recommendations for Other Services       Precautions / Restrictions Precautions Precautions: Fall Restrictions Weight Bearing Restrictions: No      Mobility  Bed Mobility Overal bed mobility: Modified Independent             General bed mobility comments: Pt sat up with HOB slightly elevated, back with level bed no assist  Transfers Overall transfer level: Modified independent Equipment used: Straight cane                Ambulation/Gait Ambulation/Gait assistance: Min guard Ambulation Distance (Feet): 150 Feet Assistive device: 1 person hand held assist;Straight cane (guarded for safety) Gait Pattern/deviations: Step-through pattern;Wide base of support;Drifts right/left (wide turns) Gait velocity: reduced Gait velocity interpretation: Below normal speed for age/gender    Stairs            Wheelchair Mobility    Modified Rankin (Stroke Patients Only)       Balance Overall balance assessment: Needs assistance Sitting-balance support: Feet  supported Sitting balance-Leahy Scale: Good     Standing balance support: Single extremity supported Standing balance-Leahy Scale: Fair                               Pertinent Vitals/Pain Pain Assessment: No/denies pain    Home Living Family/patient expects to be discharged to:: Private residence Living Arrangements: Spouse/significant other Available Help at Discharge: Family;Available 24 hours/day Type of Home: House Home Access: Stairs to enter Entrance Stairs-Rails: Right Entrance Stairs-Number of Steps: 3-4 Home Layout: One level Home Equipment: Walker - 2 wheels;Cane - single point      Prior Function Level of Independence: Independent with assistive device(s);Needs assistance   Gait / Transfers Assistance Needed: SPC usually used  ADL's / Homemaking Assistance Needed: wife assists with housework        Hand Dominance        Extremity/Trunk Assessment   Upper Extremity Assessment Upper Extremity Assessment: Overall WFL for tasks assessed    Lower Extremity Assessment Lower Extremity Assessment: Overall WFL for tasks assessed    Cervical / Trunk Assessment Cervical / Trunk Assessment: Kyphotic  Communication   Communication: No difficulties  Cognition Arousal/Alertness: Awake/alert Behavior During Therapy: WFL for tasks assessed/performed Overall Cognitive Status: Within Functional Limits for tasks assessed                      General Comments      Exercises     Assessment/Plan    PT Assessment Patient needs continued PT services  PT Problem List Decreased strength;Decreased range of motion;Decreased activity tolerance;Decreased balance;Decreased mobility;Decreased coordination          PT Treatment Interventions DME instruction;Gait training;Functional mobility training;Therapeutic activities;Therapeutic exercise;Balance training;Neuromuscular re-education;Patient/family education    PT Goals (Current goals can be  found in the Care Plan section)  Acute Rehab PT Goals Patient Stated Goal: to get home today PT Goal Formulation: With patient/family Time For Goal Achievement: 07/12/16 Potential to Achieve Goals: Good    Frequency Min 2X/week   Barriers to discharge Inaccessible home environment (stairs to enter house)      Co-evaluation               End of Session Equipment Utilized During Treatment: Gait belt Activity Tolerance: Patient tolerated treatment well;Patient limited by fatigue Patient left: in bed;with call bell/phone within reach;with family/visitor present Nurse Communication: Mobility status         Time: 1025-1055 PT Time Calculation (min) (ACUTE ONLY): 30 min   Charges:   PT Evaluation $PT Eval Low Complexity: 1 Procedure PT Treatments $Gait Training: 8-22 mins   PT G CodesRamond Dial 13-Jul-2016, 11:16 AM   Mee Hives, PT MS Acute Rehab Dept. Number: [place] and [place]

## 2016-06-28 NOTE — Discharge Instructions (Signed)
Diarrhea could be secondary to chemotherapy, virus or even food poisoning. Yo also were taking medications for constipation that could also cause diarrhea. Please use medication for constipation as needed only.

## 2016-06-29 NOTE — Progress Notes (Signed)
   06/28/16 1100  PT Visit Information  Last PT Received On 06/28/16  Assistance Needed +1  History of Present Illness 76 yo male with onset of gastroenteritis and leukocytosis, abd pain and diarrhea, cleared for c-diff, thrombocytopenia now referred to PT for triage for home.  PMHx:  GERD, malnutrition, renal transplant, lumbar fusion,   Subjective Data  Patient Stated Goal to get home today  Precautions  Precautions Fall  Restrictions  Weight Bearing Restrictions No  Pain Assessment  Pain Assessment No/denies pain  Cognition  Arousal/Alertness Awake/alert  Behavior During Therapy WFL for tasks assessed/performed  Overall Cognitive Status Within Functional Limits for tasks assessed  Bed Mobility  Overal bed mobility Modified Independent  General bed mobility comments Pt sat up with HOB slightly elevated, back with level bed no assist  Transfers  Overall transfer level Modified independent  Equipment used Straight cane  Ambulation/Gait  Ambulation/Gait assistance Min guard  Ambulation Distance (Feet) 150 Feet  Assistive device 1 person hand held assist;Straight cane (guarded for safety)  Gait Pattern/deviations Step-through pattern;Wide base of support;Drifts right/left (wide turns)  Gait velocity reduced  Gait velocity interpretation Below normal speed for age/gender  Balance  Overall balance assessment Needs assistance  Sitting-balance support Feet supported  Sitting balance-Leahy Scale Good  Standing balance support Single extremity supported  Standing balance-Leahy Scale Fair  Exercises  Exercises Other exercises (L knee 4 and R knee 4+ to5 )  PT - End of Session  Equipment Utilized During Treatment Gait belt  Activity Tolerance Patient tolerated treatment well;Patient limited by fatigue  Patient left in bed;with call bell/phone within reach;with family/visitor present  Nurse Communication Mobility status  PT - Assessment/Plan  PT Frequency (ACUTE ONLY) Min 2X/week   Follow Up Recommendations Home health PT;Supervision for mobility/OOB  PT equipment None recommended by PT  Acute Rehab PT Goals  PT Goal Formulation With patient/family  Time For Goal Achievement 07/12/16  Potential to Achieve Goals Good  PT Time Calculation  PT Start Time (ACUTE ONLY) 1025  PT Stop Time (ACUTE ONLY) 1055  PT Time Calculation (min) (ACUTE ONLY) 30 min  PT G-Codes **NOT FOR INPATIENT CLASS**  Functional Assessment Tool Used clinical judgment  Functional Limitation Mobility: Walking and moving around  Mobility: Walking and Moving Around Current Status [id number]) CJ  Mobility: Walking and Moving Around Goal Status [id number]) CI  PT General Charges  $$ ACUTE PT VISIT 1 Procedure  PT Evaluation  $PT Eval Low Complexity 1 Procedure  PT Treatments  $Gait Training 8-22 mins  Mee Hives, PT MS Acute Rehab Dept. Number: [place] [facility] [place]

## 2016-07-01 LAB — PATHOLOGIST SMEAR REVIEW

## 2016-07-03 ENCOUNTER — Other Ambulatory Visit: Payer: Self-pay | Admitting: *Deleted

## 2016-07-04 ENCOUNTER — Other Ambulatory Visit: Payer: Self-pay | Admitting: Hematology and Oncology

## 2016-07-07 ENCOUNTER — Encounter: Payer: Self-pay | Admitting: Hematology and Oncology

## 2016-07-07 ENCOUNTER — Other Ambulatory Visit: Payer: Self-pay | Admitting: Oncology

## 2016-07-07 ENCOUNTER — Inpatient Hospital Stay: Payer: Medicare HMO | Attending: Hematology and Oncology | Admitting: *Deleted

## 2016-07-07 ENCOUNTER — Inpatient Hospital Stay: Payer: Medicare HMO

## 2016-07-07 VITALS — BP 104/65 | HR 61 | Temp 97.8°F | Resp 18

## 2016-07-07 DIAGNOSIS — K269 Duodenal ulcer, unspecified as acute or chronic, without hemorrhage or perforation: Secondary | ICD-10-CM | POA: Insufficient documentation

## 2016-07-07 DIAGNOSIS — Z79899 Other long term (current) drug therapy: Secondary | ICD-10-CM | POA: Insufficient documentation

## 2016-07-07 DIAGNOSIS — K219 Gastro-esophageal reflux disease without esophagitis: Secondary | ICD-10-CM | POA: Diagnosis not present

## 2016-07-07 DIAGNOSIS — M1990 Unspecified osteoarthritis, unspecified site: Secondary | ICD-10-CM | POA: Insufficient documentation

## 2016-07-07 DIAGNOSIS — N400 Enlarged prostate without lower urinary tract symptoms: Secondary | ICD-10-CM | POA: Diagnosis not present

## 2016-07-07 DIAGNOSIS — Z87891 Personal history of nicotine dependence: Secondary | ICD-10-CM | POA: Insufficient documentation

## 2016-07-07 DIAGNOSIS — C8338 Diffuse large B-cell lymphoma, lymph nodes of multiple sites: Secondary | ICD-10-CM

## 2016-07-07 DIAGNOSIS — C8330 Diffuse large B-cell lymphoma, unspecified site: Secondary | ICD-10-CM

## 2016-07-07 DIAGNOSIS — R51 Headache: Secondary | ICD-10-CM | POA: Insufficient documentation

## 2016-07-07 DIAGNOSIS — Z7952 Long term (current) use of systemic steroids: Secondary | ICD-10-CM | POA: Diagnosis not present

## 2016-07-07 DIAGNOSIS — I10 Essential (primary) hypertension: Secondary | ICD-10-CM | POA: Insufficient documentation

## 2016-07-07 DIAGNOSIS — K5790 Diverticulosis of intestine, part unspecified, without perforation or abscess without bleeding: Secondary | ICD-10-CM | POA: Insufficient documentation

## 2016-07-07 DIAGNOSIS — N189 Chronic kidney disease, unspecified: Secondary | ICD-10-CM | POA: Insufficient documentation

## 2016-07-07 DIAGNOSIS — Z940 Kidney transplant status: Secondary | ICD-10-CM | POA: Insufficient documentation

## 2016-07-07 DIAGNOSIS — Z5111 Encounter for antineoplastic chemotherapy: Secondary | ICD-10-CM | POA: Insufficient documentation

## 2016-07-07 DIAGNOSIS — I129 Hypertensive chronic kidney disease with stage 1 through stage 4 chronic kidney disease, or unspecified chronic kidney disease: Secondary | ICD-10-CM | POA: Diagnosis not present

## 2016-07-07 DIAGNOSIS — C8333 Diffuse large B-cell lymphoma, intra-abdominal lymph nodes: Secondary | ICD-10-CM

## 2016-07-07 DIAGNOSIS — K922 Gastrointestinal hemorrhage, unspecified: Secondary | ICD-10-CM

## 2016-07-07 LAB — CBC WITH DIFFERENTIAL/PLATELET
Basophils Absolute: 0 10*3/uL (ref 0–0.1)
Basophils Relative: 0 %
Eosinophils Absolute: 0 10*3/uL (ref 0–0.7)
Eosinophils Relative: 0 %
HCT: 16.4 % — ABNORMAL LOW (ref 40.0–52.0)
Hemoglobin: 5.3 g/dL — ABNORMAL LOW (ref 13.0–18.0)
Lymphocytes Relative: 3 %
Lymphs Abs: 0.4 10*3/uL — ABNORMAL LOW (ref 1.0–3.6)
MCH: 26.7 pg (ref 26.0–34.0)
MCHC: 32.3 g/dL (ref 32.0–36.0)
MCV: 82.9 fL (ref 80.0–100.0)
Monocytes Absolute: 0.6 10*3/uL (ref 0.2–1.0)
Monocytes Relative: 5 %
Neutro Abs: 11.6 10*3/uL — ABNORMAL HIGH (ref 1.4–6.5)
Neutrophils Relative %: 92 %
Platelets: 181 10*3/uL (ref 150–440)
RBC: 1.98 MIL/uL — ABNORMAL LOW (ref 4.40–5.90)
RDW: 22.3 % — ABNORMAL HIGH (ref 11.5–14.5)
WBC: 12.6 10*3/uL — ABNORMAL HIGH (ref 3.8–10.6)

## 2016-07-07 LAB — SAMPLE TO BLOOD BANK

## 2016-07-07 LAB — BASIC METABOLIC PANEL
Anion gap: 4 — ABNORMAL LOW (ref 5–15)
BUN: 32 mg/dL — ABNORMAL HIGH (ref 6–20)
CO2: 28 mmol/L (ref 22–32)
Calcium: 9 mg/dL (ref 8.9–10.3)
Chloride: 104 mmol/L (ref 101–111)
Creatinine, Ser: 1.27 mg/dL — ABNORMAL HIGH (ref 0.61–1.24)
GFR calc Af Amer: >60 mL/min (ref >60)
GFR calc non Af Amer: 53 mL/min — ABNORMAL LOW (ref >60)
Glucose, Bld: 143 mg/dL — ABNORMAL HIGH (ref 65–99)
Potassium: 3.7 mmol/L (ref 3.5–5.1)
Sodium: 136 mmol/L (ref 135–145)

## 2016-07-07 LAB — PREPARE RBC (CROSSMATCH)

## 2016-07-07 LAB — PATHOLOGIST SMEAR REVIEW

## 2016-07-07 MED ORDER — HEPARIN SOD (PORK) LOCK FLUSH 100 UNIT/ML IV SOLN
500.0000 [IU] | Freq: Once | INTRAVENOUS | Status: AC
  Administered 2016-07-07: 500 [IU] via INTRAVENOUS

## 2016-07-07 MED ORDER — HEPARIN SOD (PORK) LOCK FLUSH 100 UNIT/ML IV SOLN
INTRAVENOUS | Status: AC
  Filled 2016-07-07: qty 5

## 2016-07-07 MED ORDER — ACETAMINOPHEN 325 MG PO TABS
650.0000 mg | ORAL_TABLET | Freq: Once | ORAL | Status: AC
  Administered 2016-07-07: 650 mg via ORAL
  Filled 2016-07-07: qty 2

## 2016-07-07 MED ORDER — DIPHENHYDRAMINE HCL 50 MG/ML IJ SOLN
25.0000 mg | Freq: Once | INTRAMUSCULAR | Status: AC
  Administered 2016-07-07: 25 mg via INTRAVENOUS
  Filled 2016-07-07: qty 1

## 2016-07-07 NOTE — Progress Notes (Signed)
Nutrition Follow-up:   ASSESSMENT:  Patient seen in infusion this pm.  Patient with B cell lymphoma  Patient with multiple recent admission.  Most recent 1/3-1/6 for gastroenteritis.   Patient reports recently has started passing blood in stool, most recent stool with blood today.  Nursing aware and patient has GI appointment tomorrow for evaluation.    Patient reports poor po intake for the past several months.  Patient reports that has been drinking ensure original.  Unable to gather true picture of intake during the day at this time.  Patient getting ready to leave infusion room.  Reports ate 1/2 sandwich (ham and cheese) today for lunch at infusion and drank some soda and had abdominal pain.  Biggest compliant is abdominal pain which is effecting intake.  Reports no nausea, vomiting, normal stool today with blood as previously stated.    Medications: reviewed  Labs: glucose 143, BUN 32, creatinine 1.27  Anthropometrics:   Weight at last phone call visit was 126 lb (11/10), current weight 122 lb (1/4)  Wife reports wt of 165 lb in Sept/Oct 2016 (24% weight loss in the last year and 3 months)  Nutrition Focused physical exam:  Nutrition-Focused physical exam completed. Findings are severe fat depletion, severe muscle depletion, and no edema.    NUTRITION DIAGNOSIS: Malnutrition related to abdominal pain, altered GI function as evidenced by severe muscle mass loss and severe fat depletion.   MALNUTRITION DIAGNOSIS: Severe malnutrition in context of chronic illness.     INTERVENTION:   Discussed easy to digest foods until able to see GI tomorrow am.   Provided samples of boost plus (360 calories in same volume of ensure) and ensure coupons.    Wife reports MD in hospital mentioned feeding tube to patient and her and they are still thinking about it   MONITORING, EVALUATION, GOAL: Patient to consume adequate calories and protein to maintain weight and/or promote weight  gain.   NEXT VISIT: phone call on January 18th   Jacaria Colburn B. Zenia Resides, [place], [place] (pager)

## 2016-07-08 ENCOUNTER — Inpatient Hospital Stay
Admission: EM | Admit: 2016-07-08 | Discharge: 2016-07-13 | DRG: 377 | Disposition: A | Discharge status: Other Acute Inpatient Hospital | Payer: Medicare HMO | Attending: Internal Medicine | Admitting: Internal Medicine

## 2016-07-08 ENCOUNTER — Encounter: Payer: Self-pay | Admitting: Emergency Medicine

## 2016-07-08 ENCOUNTER — Emergency Department: Payer: Medicare HMO

## 2016-07-08 DIAGNOSIS — I272 Pulmonary hypertension, unspecified: Secondary | ICD-10-CM | POA: Diagnosis present

## 2016-07-08 DIAGNOSIS — Z681 Body mass index (BMI) 19 or less, adult: Secondary | ICD-10-CM

## 2016-07-08 DIAGNOSIS — D638 Anemia in other chronic diseases classified elsewhere: Secondary | ICD-10-CM | POA: Diagnosis present

## 2016-07-08 DIAGNOSIS — Z7189 Other specified counseling: Secondary | ICD-10-CM | POA: Diagnosis not present

## 2016-07-08 DIAGNOSIS — K269 Duodenal ulcer, unspecified as acute or chronic, without hemorrhage or perforation: Secondary | ICD-10-CM | POA: Diagnosis present

## 2016-07-08 DIAGNOSIS — D6481 Anemia due to antineoplastic chemotherapy: Secondary | ICD-10-CM | POA: Diagnosis present

## 2016-07-08 DIAGNOSIS — C8330 Diffuse large B-cell lymphoma, unspecified site: Secondary | ICD-10-CM | POA: Diagnosis present

## 2016-07-08 DIAGNOSIS — Z940 Kidney transplant status: Secondary | ICD-10-CM | POA: Diagnosis not present

## 2016-07-08 DIAGNOSIS — Z66 Do not resuscitate: Secondary | ICD-10-CM | POA: Diagnosis present

## 2016-07-08 DIAGNOSIS — C801 Malignant (primary) neoplasm, unspecified: Secondary | ICD-10-CM | POA: Diagnosis not present

## 2016-07-08 DIAGNOSIS — K2980 Duodenitis without bleeding: Secondary | ICD-10-CM | POA: Diagnosis present

## 2016-07-08 DIAGNOSIS — K922 Gastrointestinal hemorrhage, unspecified: Secondary | ICD-10-CM | POA: Diagnosis present

## 2016-07-08 DIAGNOSIS — K259 Gastric ulcer, unspecified as acute or chronic, without hemorrhage or perforation: Secondary | ICD-10-CM | POA: Diagnosis not present

## 2016-07-08 DIAGNOSIS — Z87891 Personal history of nicotine dependence: Secondary | ICD-10-CM | POA: Diagnosis not present

## 2016-07-08 DIAGNOSIS — K219 Gastro-esophageal reflux disease without esophagitis: Secondary | ICD-10-CM | POA: Diagnosis present

## 2016-07-08 DIAGNOSIS — E785 Hyperlipidemia, unspecified: Secondary | ICD-10-CM | POA: Diagnosis present

## 2016-07-08 DIAGNOSIS — K222 Esophageal obstruction: Secondary | ICD-10-CM | POA: Diagnosis present

## 2016-07-08 DIAGNOSIS — R578 Other shock: Secondary | ICD-10-CM | POA: Diagnosis present

## 2016-07-08 DIAGNOSIS — Z515 Encounter for palliative care: Secondary | ICD-10-CM | POA: Diagnosis present

## 2016-07-08 DIAGNOSIS — I481 Persistent atrial fibrillation: Secondary | ICD-10-CM | POA: Diagnosis present

## 2016-07-08 DIAGNOSIS — I509 Heart failure, unspecified: Secondary | ICD-10-CM | POA: Diagnosis present

## 2016-07-08 DIAGNOSIS — D500 Iron deficiency anemia secondary to blood loss (chronic): Secondary | ICD-10-CM | POA: Diagnosis present

## 2016-07-08 DIAGNOSIS — D62 Acute posthemorrhagic anemia: Secondary | ICD-10-CM | POA: Diagnosis present

## 2016-07-08 DIAGNOSIS — K921 Melena: Principal | ICD-10-CM | POA: Diagnosis present

## 2016-07-08 DIAGNOSIS — Z79899 Other long term (current) drug therapy: Secondary | ICD-10-CM

## 2016-07-08 DIAGNOSIS — R140 Abdominal distension (gaseous): Secondary | ICD-10-CM | POA: Diagnosis not present

## 2016-07-08 DIAGNOSIS — M109 Gout, unspecified: Secondary | ICD-10-CM | POA: Diagnosis present

## 2016-07-08 DIAGNOSIS — I110 Hypertensive heart disease with heart failure: Secondary | ICD-10-CM | POA: Diagnosis present

## 2016-07-08 DIAGNOSIS — N400 Enlarged prostate without lower urinary tract symptoms: Secondary | ICD-10-CM | POA: Diagnosis present

## 2016-07-08 DIAGNOSIS — E46 Unspecified protein-calorie malnutrition: Secondary | ICD-10-CM | POA: Diagnosis present

## 2016-07-08 DIAGNOSIS — T8610 Unspecified complication of kidney transplant: Secondary | ICD-10-CM | POA: Diagnosis not present

## 2016-07-08 DIAGNOSIS — T451X5A Adverse effect of antineoplastic and immunosuppressive drugs, initial encounter: Secondary | ICD-10-CM | POA: Diagnosis present

## 2016-07-08 LAB — COMPREHENSIVE METABOLIC PANEL
ALK PHOS: 73 U/L (ref 38–126)
ALT: 10 U/L — ABNORMAL LOW (ref 17–63)
ANION GAP: 5 (ref 5–15)
AST: 24 U/L (ref 15–41)
Albumin: 3.1 g/dL — ABNORMAL LOW (ref 3.5–5.0)
BILIRUBIN TOTAL: 0.6 mg/dL (ref 0.3–1.2)
BUN: 37 mg/dL — ABNORMAL HIGH (ref 6–20)
CALCIUM: 10.1 mg/dL (ref 8.9–10.3)
CO2: 27 mmol/L (ref 22–32)
Chloride: 105 mmol/L (ref 101–111)
Creatinine, Ser: 1.21 mg/dL (ref 0.61–1.24)
GFR, EST NON AFRICAN AMERICAN: 56 mL/min — AB (ref >60)
Glucose, Bld: 154 mg/dL — ABNORMAL HIGH (ref 65–99)
POTASSIUM: 4.3 mmol/L (ref 3.5–5.1)
Sodium: 137 mmol/L (ref 135–145)
TOTAL PROTEIN: 5.2 g/dL — AB (ref 6.5–8.1)

## 2016-07-08 LAB — CBC
HCT: 23.6 % — ABNORMAL LOW (ref 40.0–52.0)
HEMOGLOBIN: 7.6 g/dL — AB (ref 13.0–18.0)
MCH: 27.9 pg (ref 26.0–34.0)
MCHC: 32.4 g/dL (ref 32.0–36.0)
MCV: 86.2 fL (ref 80.0–100.0)
Platelets: 187 10*3/uL (ref 150–440)
RBC: 2.73 MIL/uL — AB (ref 4.40–5.90)
RDW: 18.6 % — ABNORMAL HIGH (ref 11.5–14.5)
WBC: 15.9 10*3/uL — AB (ref 3.8–10.6)

## 2016-07-08 LAB — TYPE AND SCREEN
ABO/RH(D): O POS
Antibody Screen: NEGATIVE
UNIT DIVISION: 0
UNIT DIVISION: 0

## 2016-07-08 LAB — PREPARE RBC (CROSSMATCH)

## 2016-07-08 LAB — HEMOGLOBIN AND HEMATOCRIT, BLOOD
HEMATOCRIT: 17.3 % — AB (ref 40.0–52.0)
Hemoglobin: 5.8 g/dL — ABNORMAL LOW (ref 13.0–18.0)

## 2016-07-08 MED ORDER — TACROLIMUS 1 MG PO CAPS
3.0000 mg | ORAL_CAPSULE | Freq: Two times a day (BID) | ORAL | Status: DC
  Administered 2016-07-08 – 2016-07-13 (×8): 3 mg via ORAL
  Filled 2016-07-08 (×11): qty 3

## 2016-07-08 MED ORDER — ENSURE ENLIVE PO LIQD: 237.0000 mL | Freq: Two times a day (BID) | ORAL | Status: DC

## 2016-07-08 MED ORDER — PREDNISONE 5 MG PO TABS
5.0000 mg | ORAL_TABLET | Freq: Every day | ORAL | Status: DC
  Administered 2016-07-10 – 2016-07-13 (×4): 5 mg via ORAL
  Filled 2016-07-08 (×4): qty 1

## 2016-07-08 MED ORDER — SODIUM CHLORIDE 0.9 % IV SOLN
10.0000 mL/h | Freq: Once | INTRAVENOUS | Status: AC
  Administered 2016-07-08: 10 mL/h via INTRAVENOUS

## 2016-07-08 MED ORDER — TAMSULOSIN HCL 0.4 MG PO CAPS
0.4000 mg | ORAL_CAPSULE | Freq: Every day | ORAL | Status: DC
  Administered 2016-07-10 – 2016-07-13 (×4): 0.4 mg via ORAL
  Filled 2016-07-08 (×5): qty 1

## 2016-07-08 MED ORDER — PANTOPRAZOLE SODIUM 40 MG IV SOLR
8.0000 mg/h | INTRAVENOUS | Status: DC
  Administered 2016-07-08: 8 mg/h via INTRAVENOUS
  Filled 2016-07-08: qty 80

## 2016-07-08 MED ORDER — ACETAMINOPHEN 650 MG RE SUPP: 650.0000 mg | Freq: Four times a day (QID) | RECTAL | Status: DC | PRN

## 2016-07-08 MED ORDER — PANTOPRAZOLE SODIUM 40 MG IV SOLR
40.0000 mg | Freq: Two times a day (BID) | INTRAVENOUS | Status: DC
  Administered 2016-07-08 – 2016-07-13 (×9): 40 mg via INTRAVENOUS
  Filled 2016-07-08 (×9): qty 40

## 2016-07-08 MED ORDER — FINASTERIDE 5 MG PO TABS
5.0000 mg | ORAL_TABLET | Freq: Every day | ORAL | Status: DC
  Administered 2016-07-10 – 2016-07-13 (×4): 5 mg via ORAL
  Filled 2016-07-08 (×4): qty 1

## 2016-07-08 MED ORDER — ONDANSETRON HCL 4 MG/2ML IJ SOLN: 4.0000 mg | Freq: Four times a day (QID) | INTRAMUSCULAR | Status: DC | PRN

## 2016-07-08 MED ORDER — OXYBUTYNIN CHLORIDE ER 5 MG PO TB24
5.0000 mg | ORAL_TABLET | Freq: Every day | ORAL | Status: DC
  Administered 2016-07-08 – 2016-07-13 (×5): 5 mg via ORAL
  Filled 2016-07-08 (×5): qty 1

## 2016-07-08 MED ORDER — SODIUM CHLORIDE 0.9 % IV SOLN
INTRAVENOUS | Status: DC
  Administered 2016-07-09: 06:00:00 via INTRAVENOUS

## 2016-07-08 MED ORDER — ONDANSETRON HCL 4 MG PO TABS: 4.0000 mg | ORAL_TABLET | Freq: Four times a day (QID) | ORAL | Status: DC | PRN

## 2016-07-08 MED ORDER — ALLOPURINOL 100 MG PO TABS
100.0000 mg | ORAL_TABLET | Freq: Every day | ORAL | Status: DC
  Administered 2016-07-10 – 2016-07-13 (×4): 100 mg via ORAL
  Filled 2016-07-08 (×4): qty 1

## 2016-07-08 MED ORDER — SENNOSIDES-DOCUSATE SODIUM 8.6-50 MG PO TABS: 1.0000 | ORAL_TABLET | Freq: Every evening | ORAL | Status: DC | PRN

## 2016-07-08 MED ORDER — PANTOPRAZOLE SODIUM 40 MG IV SOLR: 40.0000 mg | Freq: Two times a day (BID) | INTRAVENOUS | Status: DC

## 2016-07-08 MED ORDER — ACETAMINOPHEN 325 MG PO TABS
650.0000 mg | ORAL_TABLET | Freq: Four times a day (QID) | ORAL | Status: DC | PRN
  Administered 2016-07-10: 650 mg via ORAL
  Filled 2016-07-08: qty 2

## 2016-07-08 MED ORDER — OXYCODONE HCL 5 MG PO TABS: 5.0000 mg | ORAL_TABLET | ORAL | Status: DC | PRN

## 2016-07-08 MED ORDER — SODIUM CHLORIDE 0.9 % IV SOLN
80.0000 mg | Freq: Once | INTRAVENOUS | Status: AC
  Administered 2016-07-08: 80 mg via INTRAVENOUS
  Filled 2016-07-08: qty 80

## 2016-07-08 NOTE — ED Notes (Signed)
Matthew Brown [phone] cell

## 2016-07-08 NOTE — ED Notes (Signed)
Attempted to update pt significant other, Mrs Laurance Flatten, no answer

## 2016-07-08 NOTE — Consult Note (Signed)
Matthew Bellows MD  [address] [place], [place] [address] Phone: [phone] Fax : [phone]  Consultation  Referring Provider:     No ref. provider found Primary Care Physician:  Enid Derry, MD Primary Gastroenterologist:  Dr. Vicente Males          Reason for Consultation:     GI bleed-melena   Date of Admission:  07/08/2016 Date of Consultation:  07/08/2016         HPI:   Matthew Brown is a 76 y.o. male  With a history of lymphoproliferative disorder, Lymphoma , bone metastasis ,s/p chemotherapy. S/p transplant H/o GI bleeds in the past . Discharged on 06/28/16 with gastroenteritis.   Today patient transferred from oncology office for low hemoglobin 6.9 with tarry black stools since weekend. Had 2 units transfusion yesterday . Moderate rt pneumothorax on pet scan 04/2016  March 2016 he was admitted for GI bleed with a hemoglobin of 5.8. Tagged red blood cell scan showed an active bleed and hepatic flexure and he was embolized in VIR. He recovered well from that episode.  CXR today shows no pneumothorax but has features of congestive heart failure.  The patient says he has had very dark brown stool but not black since Saturday ( today is Tuesday), had 4 bowel movements on Sunday and 1 the next day . Since this morning has had 3 dark brown bowel movements.   He also says has some central abdominal pain for the last few weeks, no worse presently . Denies any use of blood thinners or NSAID's. Denies any hematemesis, nasal bleeds or blood in urine.    Past Medical History:  Diagnosis Date  . Benign prostatic hypertrophy   . Chronic headache 10/19/2015  . ED (erectile dysfunction)   . End stage renal disease ([place])   . Essential hypertension   . GERD (gastroesophageal reflux disease)   . GIB (gastrointestinal bleeding)    a. [id number] s/p R colic artery embolization;  b. 02/2015 EGD: duod ulcerative mass->Bx notable for coagulative necrosis - ? ischemia vs thrombosis-->coumadin d/c'd.  . Gout   .  Hearing loss   . Hemorrhoids   . Hyperlipidemia   . Lymphoma ([place])   . Lymphoma ([place]) 2017  . Multiple thyroid nodules 06/06/2016   Noted on carotid US; dedicated US to be ordered by staff  . Osteoarthrosis, unspecified whether generalized or localized, lower leg   . Persistent atrial fibrillation ([place])    a. CHA2DS2VASc = 3-->coumadin d/c'd 02/2015 2/2 recurrent GIB.  [name] Prostatitis   . Pulmonary hypertension    a. 10/2014 Echo: EF 60-65%, mild to mod MR, mildly dil LA, nl RV, PASP 83mmHg.  [name] Renal transplant recipient   . Ulcers of both great toes [facility])     Past Surgical History:  Procedure Laterality Date  . BACK SURGERY    . ESOPHAGOGASTRODUODENOSCOPY  03/13/15   severe esophagitis, ulcerated mass  . HERNIA REPAIR  1974  . PERIPHERAL VASCULAR CATHETERIZATION N/A 05/07/2016   Procedure: Glori Luis Cath Insertion;  Surgeon: Algernon Huxley, MD;  Location: [facility];  Service: Cardiovascular;  Laterality: N/A;  . PROSTATE ABLATION    . STOMACH SURGERY     blood vessel burst  . THROAT SURGERY    . TOTAL KNEE ARTHROPLASTY      Prior to Admission medications   Medication Sig Start Date End Date Taking? Authorizing Provider  acetaminophen (TYLENOL) 325 MG tablet Take 2 tablets (650 mg total) by mouth every 6 (  six) hours as needed for mild pain (or Fever >/= 101). 05/13/16   Nicholes Mango, MD  albuterol (PROAIR HFA) 108 (90 BASE) MCG/ACT inhaler Inhale 1-2 puffs into the lungs every 4 (four) hours as needed.  02/20/14   Historical Provider, MD  allopurinol (ZYLOPRIM) 100 MG tablet Take 100 mg by mouth daily.      Historical Provider, MD  COLCRYS 0.6 MG tablet Take 1 tablet by mouth 2 (two) times daily as needed. 05/03/16   Historical Provider, MD  diphenhydrAMINE (BENADRYL) 25 mg capsule Take 1 capsule (25 mg total) by mouth at bedtime as needed for sleep. 05/13/16   Nicholes Mango, MD  feeding supplement, ENSURE ENLIVE, (ENSURE ENLIVE) LIQD Take 237 mLs by mouth 3 (three) times daily  between meals. 05/13/16   Nicholes Mango, MD  finasteride (PROSCAR) 5 MG tablet Take 5 mg by mouth daily.      Historical Provider, MD  furosemide (LASIX) 80 MG tablet Take 1 tablet by mouth daily as needed. 05/06/16   Historical Provider, MD  HYDROcodone-acetaminophen (NORCO) 5-325 MG tablet Take 1 tablet by mouth every 4 (four) hours as needed for moderate pain. Patient not taking: Reported on 06/25/2016 05/02/16   Lequita Asal, MD  hydroxypropyl methylcellulose (ISOPTO TEARS) 2.5 % ophthalmic solution Place 1 drop into both eyes as needed.     Historical Provider, MD  lactulose (CHRONULAC) 10 GM/15ML solution Take 7.5-15 mLs (5-10 g total) by mouth daily as needed for mild constipation. 11/14/15   Arnetha Courser, MD  Multiple Vitamin (MULTIVITAMIN) tablet Take 1 tablet by mouth daily.      Historical Provider, MD  ondansetron (ZOFRAN) 4 MG tablet Take 1 tablet (4 mg total) by mouth every 6 (six) hours as needed for nausea. 05/22/16   Vaughan Basta, MD  oxybutynin (DITROPAN-XL) 5 MG 24 hr tablet Take 5 mg by mouth daily.    Historical Provider, MD  oxyCODONE-acetaminophen (ROXICET) 5-325 MG tablet Take 0.5-1 tablets by mouth every 6 (six) hours as needed for severe pain. 05/26/16   Lequita Asal, MD  pantoprazole (PROTONIX) 40 MG tablet Take 1 tablet (40 mg total) by mouth 2 (two) times daily. 06/28/16   Loletha Grayer, MD  predniSONE (DELTASONE) 20 MG tablet Take 70 mg (3.5 pills) daily for 5 days after each cycle of chemotherapy. 05/30/16   Lequita Asal, MD  predniSONE (DELTASONE) 5 MG tablet Take 5 mg by mouth daily.  11/23/13   Historical Provider, MD  prochlorperazine (COMPAZINE) 10 MG tablet Take 1 tablet by mouth daily as needed. 05/17/16   Historical Provider, MD  simethicone (MYLICON) 80 MG chewable tablet Chew 1 tablet (80 mg total) by mouth every 6 (six) hours as needed for flatulence. 06/28/16   Loletha Grayer, MD  sucralfate (CARAFATE) 1 g tablet Take 1 tablet (1 g total)  by mouth 4 (four) times daily. Resume taking after one week- once finished taking oral levaquine. ( to avoid interaction.) 05/22/16   Vaughan Basta, MD  tacrolimus (PROGRAF) 1 MG capsule Take 3 mg by mouth 2 (two) times daily. Reported on 08/16/2015    Historical Provider, MD  Tamsulosin HCl (FLOMAX) 0.4 MG CAPS Take 0.4 mg by mouth daily.      Historical Provider, MD    Family History  Problem Relation Age of Onset  . Cancer Mother     throat  . Diabetes Brother   . Heart disease Brother   . Stroke Brother   . Hypertension Brother   .  Diabetes Sister   . Heart disease Sister   . Hypertension Sister   . Diabetes Sister   . Diabetes Brother   . COPD Neg Hx   . Kidney disease Neg Hx   . Prostate cancer Neg Hx      Social History  Substance Use Topics  . Smoking status: Former Smoker    Packs/day: 1.00    Years: 25.00    Types: Cigarettes    Quit date: 06/23/1978  . Smokeless tobacco: Never Used  . Alcohol use No    Allergies as of 07/08/2016  . (No Known Allergies)    Review of Systems:    All systems reviewed and negative except where noted in HPI.   Physical Exam:  Vital signs in last 24 hours: Temp:  [97.8 F (36.6 C)] 97.8 F (36.6 C) (01/16 1243) Pulse Rate:  [61-78] 78 (01/16 1243) Resp:  [16-18] 16 (01/16 1243) BP: (104-129)/(42-65) 129/42 (01/16 1243) SpO2:  [100 %] 100 % (01/16 1243) Weight:  [122 lb (55.3 kg)] 122 lb (55.3 kg) (01/16 1243)   General:   Pleasant, cooperative in NAD Head:  Normocephalic and atraumatic. Eyes:   No icterus.   Conjunctiva pink. PERRLA. Ears:  Normal auditory acuity. Neck:  Supple; no masses or thyroidomegaly Lungs: Respirations even and unlabored. Lungs clear to auscultation bilaterally.   No wheezes, crackles, or rhonchi.  Heart:  Regular rate and rhythm;  Without murmur, clicks, rubs or gallops Abdomen:  Soft, nondistended, nontender. Normal bowel sounds. No appreciable masses or hepatomegaly.  No rebound or  guarding.  Rectal:  Not performed. Extremities:  On rt arm has av fistula with palpable thrill/bruit  Neurologic:  Alert and oriented x3;  grossly normal neurologically. Skin:  Intact without significant lesions or rashes. Cervical Nodes:  No significant cervical adenopathy. Psych:  Alert and cooperative. Normal affect.  LAB RESULTS:  Recent Labs  07/07/16 1004 07/08/16 1255  WBC 12.6* 15.9*  HGB 5.3* 7.6*  HCT 16.4* 23.6*  PLT 181 187   BMET  Recent Labs  07/07/16 1004 07/08/16 1255  NA 136 137  K 3.7 4.3  CL 104 105  CO2 28 27  GLUCOSE 143* 154*  BUN 32* 37*  CREATININE 1.27* 1.21  CALCIUM 9.0 10.1   LFT  Recent Labs  07/08/16 1255  PROT 5.2*  ALBUMIN 3.1*  AST 24  ALT 10*  ALKPHOS 73  BILITOT 0.6   PT/INR No results for input(s): LABPROT, INR in the last 72 hours.  STUDIES: Dg Chest Portable 1 View  Result Date: 07/08/2016 CLINICAL DATA:  Epigastric pain. EXAM: PORTABLE CHEST 1 VIEW COMPARISON:  05/21/2016. FINDINGS: PowerPort catheter noted with lead tip projected over the right atrium. Cardiomegaly with mild pulmonary vascular prominence and bilateral interstitial prominence. A mild component congestive heart failure cannot be excluded. Findings have improved from prior study of 05/21/2016. IMPRESSION: 1. PowerPort catheter noted in stable position. 2. Cardiomegaly with mild bilateral from interstitial prominence. A very mild component congestive heart failure cannot be excluded. Findings are significantly improved from prior study of 05/21/2016. Electronically Signed   By: Marcello Moores  Register   On: 07/08/2016 14:42      Impression / Plan:   Matthew Brown is a 76 y.o. y/o male presents with severe anemia and  History suggestive of an upper GI bleed (BUN/creatinine ratio elevated).   Plan 1. Keep NPO 2. EGD in am  3. Monitor CBC and transfuse as needed 4. IV PPI 5. IF has  evidence of active bleeding this evening/tonight  would recommend tagged RBC scan    Thank you for involving me in the care of this patient.      LOS: 0 days   Matthew Bellows, MD  07/08/2016, 2:52 PM

## 2016-07-08 NOTE — ED Notes (Signed)
Pt to ed with c/o weakness and HGB of 6.8.  Pt sent from mds office for care. Pt reports he is currently receiving chemo for lymphoma.  States he got 2 units of blood yesterday for low HGb.  Pt alert and oriented.  Pt states decreased appetite.

## 2016-07-08 NOTE — ED Notes (Addendum)
Pt had BM, black and tarry and watery. NO bright red blood noted but definitely black in color.

## 2016-07-08 NOTE — ED Provider Notes (Signed)
[facility] Emergency Department Provider Note    First MD Initiated Contact with Patient 07/08/16 1337     (approximate)  I have reviewed the triage vital signs and the nursing notes.   HISTORY  Chief Complaint Abnormal Lab and GI Bleeding    HPI Matthew Brown is a 76 y.o. male with a history of GI bleeding presents with persistent blood loss anemia after being seen in GI clinic today.. Patient on chemotherapy for lymphoma. Has been having ongoing issues with symptomatic anemia. Was transfused 2 units in clinic yesterday. States she's been having black tarry stools since Saturday. Is also having intermittent epigastric pain. States he does have a history of ulcers. Denies any fevers. Not on any blood thinners.  Rectal exam performed in clinic was grossly guaiac positive.   Past Medical History:  Diagnosis Date  . Benign prostatic hypertrophy   . Chronic headache 10/19/2015  . ED (erectile dysfunction)   . End stage renal disease ([place])   . Essential hypertension   . GERD (gastroesophageal reflux disease)   . GIB (gastrointestinal bleeding)    a. [id number] s/p R colic artery embolization;  b. 02/2015 EGD: duod ulcerative mass->Bx notable for coagulative necrosis - ? ischemia vs thrombosis-->coumadin d/c'd.  . Gout   . Hearing loss   . Hemorrhoids   . Hyperlipidemia   . Lymphoma ([place])   . Lymphoma ([place]) 2017  . Multiple thyroid nodules 06/06/2016   Noted on carotid US; dedicated US to be ordered by staff  . Osteoarthrosis, unspecified whether generalized or localized, lower leg   . Persistent atrial fibrillation ([place])    a. CHA2DS2VASc = 3-->coumadin d/c'd 02/2015 2/2 recurrent GIB.  [name] Prostatitis   . Pulmonary hypertension    a. 10/2014 Echo: EF 60-65%, mild to mod MR, mildly dil LA, nl RV, PASP 20mmHg.  [name] Renal transplant recipient   . Ulcers of both great toes (HCC)    Family History  Problem Relation Age of Onset  . Cancer Mother     throat  .  Diabetes Brother   . Heart disease Brother   . Stroke Brother   . Hypertension Brother   . Diabetes Sister   . Heart disease Sister   . Hypertension Sister   . Diabetes Sister   . Diabetes Brother   . COPD Neg Hx   . Kidney disease Neg Hx   . Prostate cancer Neg Hx    Past Surgical History:  Procedure Laterality Date  . BACK SURGERY    . ESOPHAGOGASTRODUODENOSCOPY  03/13/15   severe esophagitis, ulcerated mass  . HERNIA REPAIR  1974  . PERIPHERAL VASCULAR CATHETERIZATION N/A 05/07/2016   Procedure: Glori Luis Cath Insertion;  Surgeon: Algernon Huxley, MD;  Location: [place] [facility];  Service: Cardiovascular;  Laterality: N/A;  . PROSTATE ABLATION    . STOMACH SURGERY     blood vessel burst  . THROAT SURGERY    . TOTAL KNEE ARTHROPLASTY     Patient Active Problem List   Diagnosis Date Noted  . GIB (gastrointestinal bleeding) 07/08/2016  . Encounter for antineoplastic chemotherapy 07/07/2016  . Gastroenteritis 06/25/2016  . Multiple thyroid nodules 06/06/2016  . Liver metastases ([place]) 05/26/2016  . Bone metastasis ([place]) 05/26/2016  . Non-intractable vomiting with nausea   . Diarrhea   . Neutropenic fever ([place]) 05/18/2016  . Carotid atherosclerosis, bilateral 05/09/2016  . Protein-calorie malnutrition, severe 05/08/2016  . Hypercalcemia 05/06/2016  . Renal transplant recipient 05/06/2016  .  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 Tumor lysis syndrome 05/06/2016  . High serum parathyroid hormone (PTH) 05/03/2016  . Pain in joint involving pelvic region and thigh 05/03/2016  . Diffuse large B cell lymphoma ([place]) 04/30/2016  . Abnormal positron emission tomography (PET) scan   . Anemia 04/29/2016  . Lymphoma ([place]) 04/28/2016  . Pneumothorax 04/28/2016  . Hyponatremia 04/28/2016  . Adenopathy 04/15/2016  . Bone disease 04/15/2016  . Weight loss 04/15/2016  . Spinal stenosis of cervical region 11/21/2015  . Pleural effusion, right 11/21/2015  .  Right groin pain 10/19/2015  . Chronic headache 10/19/2015  . Hip strain 10/09/2015  . BPH with obstruction/lower urinary tract symptoms 09/03/2015  . Prostatitis, chronic 09/03/2015  . Erectile dysfunction of organic origin 09/03/2015  . Difficulty urinating 08/14/2015  . Accumulation of fluid in tissues 07/18/2015  . Persistent atrial fibrillation ([place])   . Pain in finger of right hand 06/15/2015  . Eustachian tube dysfunction, left 06/06/2015  . Chronic insomnia 06/06/2015  . H/O: upper GI bleed 06/06/2015  . Pleural cavity effusion 04/07/2015  . Abdominal pain, generalized 04/04/2015  . Pneumonia due to Haemophilus influenzae ([place]) 03/16/2015  . Encounter for therapeutic drug monitoring 07/27/2013  . Long term (current) use of anticoagulants 09/25/2010  . Hyperlipidemia 10/25/2008  . Essential hypertension 10/25/2008  . ATRIAL FIBRILLATION 10/25/2008  . GERD 10/25/2008  . RENAL FAILURE, END STAGE 10/25/2008  . Osteoarthrosis, unspecified whether generalized or localized, involving lower leg 10/25/2008  . BENIGN PROSTATIC HYPERTROPHY, HX OF 10/25/2008      Prior to Admission medications   Medication Sig Start Date End Date Taking? Authorizing Provider  acetaminophen (TYLENOL) 325 MG tablet Take 2 tablets (650 mg total) by mouth every 6 (six) hours as needed for mild pain (or Fever >/= 101). 05/13/16  Yes Nicholes Mango, MD  albuterol (PROAIR HFA) 108 (90 BASE) MCG/ACT inhaler Inhale 1-2 puffs into the lungs every 4 (four) hours as needed.  02/20/14  Yes Historical Provider, MD  allopurinol (ZYLOPRIM) 100 MG tablet Take 100 mg by mouth daily.     Yes Historical Provider, MD  COLCRYS 0.6 MG tablet Take 1 tablet by mouth 2 (two) times daily as needed. 05/03/16  Yes Historical Provider, MD  diphenhydrAMINE (BENADRYL) 25 mg capsule Take 1 capsule (25 mg total) by mouth at bedtime as needed for sleep. 05/13/16  Yes Nicholes Mango, MD  finasteride (PROSCAR) 5 MG tablet Take 5 mg by mouth  daily.     Yes Historical Provider, MD  furosemide (LASIX) 80 MG tablet Take 1 tablet by mouth daily as needed. 05/06/16  Yes Historical Provider, MD  hydroxypropyl methylcellulose (ISOPTO TEARS) 2.5 % ophthalmic solution Place 1 drop into both eyes as needed.    Yes Historical Provider, MD  Multiple Vitamin (MULTIVITAMIN) tablet Take 1 tablet by mouth daily.     Yes Historical Provider, MD  ondansetron (ZOFRAN) 4 MG tablet Take 1 tablet (4 mg total) by mouth every 6 (six) hours as needed for nausea. 05/22/16  Yes Vaughan Basta, MD  oxybutynin (DITROPAN-XL) 5 MG 24 hr tablet Take 5 mg by mouth daily.   Yes Historical Provider, MD  pantoprazole (PROTONIX) 40 MG tablet Take 1 tablet (40 mg total) by mouth 2 (two) times daily. 06/28/16  Yes Richard Leslye Peer, MD  predniSONE (DELTASONE) 20 MG tablet Take 70 mg (3.5 pills) daily for 5 days after each cycle of chemotherapy. 05/30/16  Yes Lequita Asal, MD  predniSONE (DELTASONE) 5 MG  tablet Take 5 mg by mouth daily.  11/23/13  Yes Historical Provider, MD  prochlorperazine (COMPAZINE) 10 MG tablet Take 1 tablet by mouth daily as needed. 05/17/16  Yes Historical Provider, MD  simethicone (MYLICON) 80 MG chewable tablet Chew 1 tablet (80 mg total) by mouth every 6 (six) hours as needed for flatulence. 06/28/16  Yes Richard Leslye Peer, MD  sucralfate (CARAFATE) 1 g tablet Take 1 tablet (1 g total) by mouth 4 (four) times daily. Resume taking after one week- once finished taking oral levaquine. ( to avoid interaction.) 05/22/16  Yes Vaughan Basta, MD  tacrolimus (PROGRAF) 1 MG capsule Take 3 mg by mouth 2 (two) times daily. Reported on 08/16/2015   Yes Historical Provider, MD  Tamsulosin HCl (FLOMAX) 0.4 MG CAPS Take 0.4 mg by mouth daily.     Yes Historical Provider, MD  feeding supplement, ENSURE ENLIVE, (ENSURE ENLIVE) LIQD Take 237 mLs by mouth 3 (three) times daily between meals. 05/13/16   Nicholes Mango, MD  HYDROcodone-acetaminophen (NORCO) 5-325 MG  tablet Take 1 tablet by mouth every 4 (four) hours as needed for moderate pain. Patient not taking: Reported on 06/25/2016 05/02/16   Lequita Asal, MD  lactulose (CHRONULAC) 10 GM/15ML solution Take 7.5-15 mLs (5-10 g total) by mouth daily as needed for mild constipation. 11/14/15   Arnetha Courser, MD  oxyCODONE-acetaminophen (ROXICET) 5-325 MG tablet Take 0.5-1 tablets by mouth every 6 (six) hours as needed for severe pain. Patient not taking: Reported on 07/08/2016 05/26/16   Lequita Asal, MD    Allergies Patient has no known allergies.    Social History Social History  Substance Use Topics  . Smoking status: Former Smoker    Packs/day: 1.00    Years: 25.00    Types: Cigarettes    Quit date: 06/23/1978  . Smokeless tobacco: Never Used  . Alcohol use No    Review of Systems Patient denies headaches, rhinorrhea, blurry vision, numbness, shortness of breath, chest pain, edema, cough, abdominal pain, nausea, vomiting, diarrhea, dysuria, fevers, rashes or hallucinations unless otherwise stated above in HPI. ____________________________________________   PHYSICAL EXAM:  VITAL SIGNS: Vitals:   07/08/16 1600 07/08/16 1630  BP: (!) 116/49 (!) 115/50  Pulse: 75 66  Resp: 13 17  Temp:     Constitutional: Alert and in no acute distress. Eyes: Conjunctivae are normal. PERRL. EOMI. Head: Atraumatic. Nose: No congestion/rhinnorhea. Mouth/Throat: Mucous membranes are moist.  Oropharynx non-erythematous. Neck: No stridor. Painless ROM. No cervical spine tenderness to palpation Hematological/Lymphatic/Immunilogical: No cervical lymphadenopathy. Cardiovascular: Normal rate, regular rhythm. Grossly normal heart sounds.  Good peripheral circulation. Respiratory: Normal respiratory effort.  No retractions. Lungs CTAB. Gastrointestinal: Soft and nontender. No distention. No abdominal bruits. No CVA tenderness. Musculoskeletal: No lower extremity tenderness nor edema.  No joint  effusions. Neurologic:  Normal speech and language. No gross focal neurologic deficits are appreciated. No gait instability. Skin:  Skin is warm, dry and intact. No rash noted. Psychiatric: Mood and affect are normal. Speech and behavior are normal.  ____________________________________________   LABS (all labs ordered are listed, but only abnormal results are displayed)  Results for orders placed or performed during the hospital encounter of 07/08/16 (from the past 24 hour(s))  Comprehensive metabolic panel     Status: Abnormal   Collection Time: 07/08/16 12:55 PM  Result Value Ref Range   Sodium 137 135 - 145 mmol/L   Potassium 4.3 3.5 - 5.1 mmol/L   Chloride 105 101 - 111 mmol/L  CO2 27 22 - 32 mmol/L   Glucose, Bld 154 (H) 65 - 99 mg/dL   BUN 37 (H) 6 - 20 mg/dL   Creatinine, Ser 1.21 0.61 - 1.24 mg/dL   Calcium 10.1 8.9 - 10.3 mg/dL   Total Protein 5.2 (L) 6.5 - 8.1 g/dL   Albumin 3.1 (L) 3.5 - 5.0 g/dL   AST 24 15 - 41 U/L   ALT 10 (L) 17 - 63 U/L   Alkaline Phosphatase 73 38 - 126 U/L   Total Bilirubin 0.6 0.3 - 1.2 mg/dL   GFR calc non Af Amer 56 (L) >60 mL/min   GFR calc Af Amer >60 >60 mL/min   Anion gap 5 5 - 15  CBC     Status: Abnormal   Collection Time: 07/08/16 12:55 PM  Result Value Ref Range   WBC 15.9 (H) 3.8 - 10.6 K/uL   RBC 2.73 (L) 4.40 - 5.90 MIL/uL   Hemoglobin 7.6 (L) 13.0 - 18.0 g/dL   HCT 23.6 (L) 40.0 - 52.0 %   MCV 86.2 80.0 - 100.0 fL   MCH 27.9 26.0 - 34.0 pg   MCHC 32.4 32.0 - 36.0 g/dL   RDW 18.6 (H) 11.5 - 14.5 %   Platelets 187 150 - 440 K/uL  Type and screen     Status: None (Preliminary result)   Collection Time: 07/08/16  1:40 PM  Result Value Ref Range   ABO/RH(D) O POS    Antibody Screen NEG    Sample Expiration 07/11/2016    Unit Number [id number]    Blood Component Type RBC, LR IRR    Unit division 00    Status of Unit ALLOCATED    Transfusion Status OK TO TRANSFUSE    Crossmatch Result Compatible    Unit Number  [id number]    Blood Component Type RBC, LR IRR    Unit division 00    Status of Unit ALLOCATED    Transfusion Status OK TO TRANSFUSE    Crossmatch Result Compatible   Prepare RBC (crossmatch)     Status: None   Collection Time: 07/08/16  1:40 PM  Result Value Ref Range   Order Confirmation ORDER PROCESSED BY BLOOD BANK   Prepare RBC     Status: None   Collection Time: 07/08/16  2:30 PM  Result Value Ref Range   Order Confirmation ORDER PROCESSED BY BLOOD BANK    ____________________________________________ ____________________________________________  RADIOLOGY  I personally reviewed all radiographic images ordered to evaluate for the above acute complaints and reviewed radiology reports and findings.  These findings were personally discussed with the patient.  Please see medical record for radiology report.  ____________________________________________   PROCEDURES  Procedure(s) performed:  Procedures    Critical Care performed: yes  CRITICAL CARE Performed by: Merlyn Lot   Total critical care time: 30 minutes  Critical care time was exclusive of separately billable procedures and treating other patients.  Critical care was necessary to treat or prevent imminent or life-threatening deterioration.  Critical care was time spent personally by me on the following activities: development of treatment plan with patient and/or surrogate as well as nursing, discussions with consultants, evaluation of patient's response to treatment, examination of patient, obtaining history from patient or surrogate, ordering and performing treatments and interventions, ordering and review of laboratory studies, ordering and review of radiographic studies, pulse oximetry and re-evaluation of patient's condition.  ____________________________________________   INITIAL IMPRESSION / ASSESSMENT AND PLAN / ED COURSE  Pertinent  labs & imaging results that were available during my care of  the patient were reviewed by me and considered in my medical decision making (see chart for details).  DDX:  Upper GI bleed, lower GI bleed, enteritis, anemia, malignancy  Matthew Brown is a 76 y.o. who presents to the ED with symptomatic acute blood loss anemia in the setting of guaiac-positive stool and history of ulcerative disease. Patient afebrile and hemodynamically stable but with refractory anemia status post 2 units as an outpatient yesterday. Based on the acuity of his presentation failing outpatient management do feel patient will require admission for transfusion and GI consultation. I spoke with Dr. Vicente Males GI who agrees to evaluate patient. A spoke with Dr. Genia Harold of hospitalist group who agrees to admit patient for further evaluation management.  Have discussed with the patient and available family all diagnostics and treatments performed thus far and all questions were answered to the best of my ability. The patient demonstrates understanding and agreement with plan.   Clinical Course      ____________________________________________   FINAL CLINICAL IMPRESSION(S) / ED DIAGNOSES  Final diagnoses:  Acute upper GI bleed      NEW MEDICATIONS STARTED DURING THIS VISIT:  New Prescriptions   No medications on file     Note:  This document was prepared using Dragon voice recognition software and may include unintentional dictation errors.    Merlyn Lot, MD 07/08/16 [phone]

## 2016-07-08 NOTE — ED Triage Notes (Signed)
Pt to ED from oncology office for low hemoglobin of 6.8 .Pt has been having black tarry stool since this weekend, pt received 2 units of blood yesterday at cancer center.  Pt currently undergoing chemo for cancer. Pt denies any weakness of dizziness. Pt A&Ox4. VS stable

## 2016-07-08 NOTE — ED Notes (Signed)
First BM since being in ED.   States schedule chemo this coming Friday. States previous chemo treatment was previous 21 days. Pt is alert and oriented, ambulatory. No distress noted at this time. Pt requesting that port be accessed for 2nd access. Port accessed yest when received 2 units of blood.

## 2016-07-08 NOTE — H&P (Signed)
[facility] [place] NAME: Matthew Brown    MR#:  [id number]  DATE OF BIRTH:  September 18, 1940  DATE OF ADMISSION:  07/08/2016  PRIMARY CARE PHYSICIAN: Enid Derry, MD   REQUESTING/REFERRING PHYSICIAN: dr Quentin Cornwall  CHIEF COMPLAINT:   Sent from [facility] GI emu HISTORY OF PRESENT ILLNESS:  Matthew Brown  is a 76 y.o. male with a known history of End-stage renal disease status post right kidney transplant, lymphoma on chemotherapy and persistent atrial fibrillation discharge from the hospital January 6 with viral gastroenteritis who presented to the GI clinic for evaluation of anemia. Patient reports that he has epigastric pain with poor appetite. 2 days ago he noted dark colored stools and bright red blood. He has not had a bowel movement today. His hemoglobin was checked and it is 6.8 with grossly heme positive stool in the rectal vault. Hemoglobin is 7.6 today in the emergency room His last endoscopy was in July which showed hyperplastic BRUNNER glands but normal stomach he also had colonoscopy in 2014 which showed tubulovillous adenoma with high-grade dysplasia.  He is consented for blood transfusion by the ER physician  PAST MEDICAL HISTORY:   Past Medical History:  Diagnosis Date  . Benign prostatic hypertrophy   . Chronic headache 10/19/2015  . ED (erectile dysfunction)   . End stage renal disease ([place])   . Essential hypertension   . GERD (gastroesophageal reflux disease)   . GIB (gastrointestinal bleeding)    a. [id number] s/p R colic artery embolization;  b. 02/2015 EGD: duod ulcerative mass->Bx notable for coagulative necrosis - ? ischemia vs thrombosis-->coumadin d/c'd.  . Gout   . Hearing loss   . Hemorrhoids   . Hyperlipidemia   . Lymphoma ([place])   . Lymphoma ([place]) 2017  . Multiple thyroid nodules 06/06/2016   Noted on carotid US; dedicated US to be ordered by staff  . Osteoarthrosis, unspecified whether generalized or localized, lower  leg   . Persistent atrial fibrillation ([place])    a. CHA2DS2VASc = 3-->coumadin d/c'd 02/2015 2/2 recurrent GIB.  [name] Prostatitis   . Pulmonary hypertension    a. 10/2014 Echo: EF 60-65%, mild to mod MR, mildly dil LA, nl RV, PASP 59mmHg.  [name] Renal transplant recipient   . Ulcers of both great toes ([employer])     PAST SURGICAL HISTORY:   Past Surgical History:  Procedure Laterality Date  . BACK SURGERY    . ESOPHAGOGASTRODUODENOSCOPY  03/13/15   severe esophagitis, ulcerated mass  . HERNIA REPAIR  1974  . PERIPHERAL VASCULAR CATHETERIZATION N/A 05/07/2016   Procedure: Glori Luis Cath Insertion;  Surgeon: Algernon Huxley, MD;  Location: [facility];  Service: Cardiovascular;  Laterality: N/A;  . PROSTATE ABLATION    . STOMACH SURGERY     blood vessel burst  . THROAT SURGERY    . TOTAL KNEE ARTHROPLASTY      SOCIAL HISTORY:   Social History  Substance Use Topics  . Smoking status: Former Smoker    Packs/day: 1.00    Years: 25.00    Types: Cigarettes    Quit date: 06/23/1978  . Smokeless tobacco: Never Used  . Alcohol use No    FAMILY HISTORY:   Family History  Problem Relation Age of Onset  . Cancer Mother     throat  . Diabetes Brother   . Heart disease Brother   . Stroke Brother   . Hypertension Brother   . Diabetes Sister   .  Heart disease Sister   . Hypertension Sister   . Diabetes Sister   . Diabetes Brother   . COPD Neg Hx   . Kidney disease Neg Hx   . Prostate cancer Neg Hx     DRUG ALLERGIES:  No Known Allergies  REVIEW OF SYSTEMS:   Review of Systems  Constitutional: Positive for malaise/fatigue. Negative for chills and fever.  HENT: Negative.  Negative for ear discharge, ear pain, hearing loss, nosebleeds and sore throat.   Eyes: Negative.  Negative for blurred vision and pain.  Respiratory: Negative.  Negative for cough, hemoptysis, shortness of breath and wheezing.   Cardiovascular: Negative.  Negative for chest pain, palpitations and leg swelling.   Gastrointestinal: Positive for abdominal pain, blood in stool and melena. Negative for diarrhea, nausea and vomiting.  Genitourinary: Negative.  Negative for dysuria.  Musculoskeletal: Negative.  Negative for back pain.  Skin: Negative.   Neurological: Positive for weakness. Negative for dizziness, tremors, speech change, focal weakness, seizures and headaches.  Endo/Heme/Allergies: Negative.  Does not bruise/bleed easily.  Psychiatric/Behavioral: Negative.  Negative for depression, hallucinations and suicidal ideas.    MEDICATIONS AT HOME:   Prior to Admission medications   Medication Sig Start Date End Date Taking? Authorizing Provider  acetaminophen (TYLENOL) 325 MG tablet Take 2 tablets (650 mg total) by mouth every 6 (six) hours as needed for mild pain (or Fever >/= 101). 05/13/16   Nicholes Mango, MD  albuterol (PROAIR HFA) 108 (90 BASE) MCG/ACT inhaler Inhale 1-2 puffs into the lungs every 4 (four) hours as needed.  02/20/14   Historical Provider, MD  allopurinol (ZYLOPRIM) 100 MG tablet Take 100 mg by mouth daily.      Historical Provider, MD  COLCRYS 0.6 MG tablet Take 1 tablet by mouth 2 (two) times daily as needed. 05/03/16   Historical Provider, MD  diphenhydrAMINE (BENADRYL) 25 mg capsule Take 1 capsule (25 mg total) by mouth at bedtime as needed for sleep. 05/13/16   Nicholes Mango, MD  feeding supplement, ENSURE ENLIVE, (ENSURE ENLIVE) LIQD Take 237 mLs by mouth 3 (three) times daily between meals. 05/13/16   Nicholes Mango, MD  finasteride (PROSCAR) 5 MG tablet Take 5 mg by mouth daily.      Historical Provider, MD  furosemide (LASIX) 80 MG tablet Take 1 tablet by mouth daily as needed. 05/06/16   Historical Provider, MD  HYDROcodone-acetaminophen (NORCO) 5-325 MG tablet Take 1 tablet by mouth every 4 (four) hours as needed for moderate pain. Patient not taking: Reported on 06/25/2016 05/02/16   Lequita Asal, MD  hydroxypropyl methylcellulose (ISOPTO TEARS) 2.5 % ophthalmic  solution Place 1 drop into both eyes as needed.     Historical Provider, MD  lactulose (CHRONULAC) 10 GM/15ML solution Take 7.5-15 mLs (5-10 g total) by mouth daily as needed for mild constipation. 11/14/15   Arnetha Courser, MD  Multiple Vitamin (MULTIVITAMIN) tablet Take 1 tablet by mouth daily.      Historical Provider, MD  ondansetron (ZOFRAN) 4 MG tablet Take 1 tablet (4 mg total) by mouth every 6 (six) hours as needed for nausea. 05/22/16   Vaughan Basta, MD  oxybutynin (DITROPAN-XL) 5 MG 24 hr tablet Take 5 mg by mouth daily.    Historical Provider, MD  oxyCODONE-acetaminophen (ROXICET) 5-325 MG tablet Take 0.5-1 tablets by mouth every 6 (six) hours as needed for severe pain. 05/26/16   Lequita Asal, MD  pantoprazole (PROTONIX) 40 MG tablet Take 1 tablet (40 mg total)  by mouth 2 (two) times daily. 06/28/16   Loletha Grayer, MD  predniSONE (DELTASONE) 20 MG tablet Take 70 mg (3.5 pills) daily for 5 days after each cycle of chemotherapy. 05/30/16   Lequita Asal, MD  predniSONE (DELTASONE) 5 MG tablet Take 5 mg by mouth daily.  11/23/13   Historical Provider, MD  prochlorperazine (COMPAZINE) 10 MG tablet Take 1 tablet by mouth daily as needed. 05/17/16   Historical Provider, MD  simethicone (MYLICON) 80 MG chewable tablet Chew 1 tablet (80 mg total) by mouth every 6 (six) hours as needed for flatulence. 06/28/16   Loletha Grayer, MD  sucralfate (CARAFATE) 1 g tablet Take 1 tablet (1 g total) by mouth 4 (four) times daily. Resume taking after one week- once finished taking oral levaquine. ( to avoid interaction.) 05/22/16   Vaughan Basta, MD  tacrolimus (PROGRAF) 1 MG capsule Take 3 mg by mouth 2 (two) times daily. Reported on 08/16/2015    Historical Provider, MD  Tamsulosin HCl (FLOMAX) 0.4 MG CAPS Take 0.4 mg by mouth daily.      Historical Provider, MD      VITAL SIGNS:  Blood pressure (!) 129/42, pulse 78, temperature 97.8 F (36.6 C), temperature source Oral, resp. rate  16, height 5\' 6"  (1.676 m), weight 55.3 kg (122 lb), SpO2 100 %.  PHYSICAL EXAMINATION:   Physical Exam  Constitutional: He is oriented to person, place, and time and well-developed, well-nourished, and in no distress. No distress.  Very thin and frail  HENT:  Head: Normocephalic.  Eyes: No scleral icterus.  Neck: Normal range of motion. Neck supple. No JVD present. No tracheal deviation present.  Cardiovascular: Normal rate and regular rhythm.  Exam reveals no gallop and no friction rub.   Murmur heard. Pulmonary/Chest: Effort normal and breath sounds normal. No respiratory distress. He has no wheezes. He has no rales. He exhibits no tenderness.  Abdominal: Soft. Bowel sounds are normal. He exhibits no distension and no mass. There is no tenderness. There is no rebound and no guarding.  Musculoskeletal: Normal range of motion. He exhibits no edema.  Neurological: He is alert and oriented to person, place, and time.  Skin: Skin is warm. No rash noted. No erythema.  Psychiatric: Affect and judgment normal.      LABORATORY PANEL:   CBC  Recent Labs Lab 07/08/16 1255  WBC 15.9*  HGB 7.6*  HCT 23.6*  PLT 187   ------------------------------------------------------------------------------------------------------------------  Chemistries   Recent Labs Lab 07/08/16 1255  NA 137  K 4.3  CL 105  CO2 27  GLUCOSE 154*  BUN 37*  CREATININE 1.21  CALCIUM 10.1  AST 24  ALT 10*  ALKPHOS 73  BILITOT 0.6   ------------------------------------------------------------------------------------------------------------------  Cardiac Enzymes No results for input(s): TROPONINI in the last 168 hours. ------------------------------------------------------------------------------------------------------------------  RADIOLOGY:  Dg Chest Portable 1 View  Result Date: 07/08/2016 CLINICAL DATA:  Epigastric pain. EXAM: PORTABLE CHEST 1 VIEW COMPARISON:  05/21/2016. FINDINGS:  PowerPort catheter noted with lead tip projected over the right atrium. Cardiomegaly with mild pulmonary vascular prominence and bilateral interstitial prominence. A mild component congestive heart failure cannot be excluded. Findings have improved from prior study of 05/21/2016. IMPRESSION: 1. PowerPort catheter noted in stable position. 2. Cardiomegaly with mild bilateral from interstitial prominence. A very mild component congestive heart failure cannot be excluded. Findings are significantly improved from prior study of 05/21/2016. Electronically Signed   By: Marcello Moores  Register   On: 07/08/2016 14:42    EKG:  IMPRESSION AND PLAN:   76 year old male with lymphoma currently on chemotherapy who had a follow-up echo in our clinic GI for anemia by the request of Dr. Mike Gip and has been having dark-colored stools which are guaiac positive.  1. Acute on chronic anemia: Transfuse 1 unit PRBC GI and oncology evaluation Continue PPI No NSAIDs Clear liquid diet for now and nothing by mouth after midnight Follow hemoglobin every 8 hours   2. GI bleed: Plan as outlined below. I will order GI bleeding scan  3. Leukocytosis: Check UA. Chest x-ray shows no evidence of infection This is most likely due to prednisone use for chemotherapy  4. History of lymphoma: Oncology evaluation  5. Persistent atrial fibrillation Coumadin has been discontinued due to GI bleed 6. History of renal transplant: Continue Prograf and prednisone  7. BPH on tamsulosin and finasteride  8. Protein calorie malnutrition: Continue feeding supplements. All the records are reviewed and case discussed with ED provider. Management plans discussed with the patient and he is in agreement  CODE STATUS: full  TOTAL TIME TAKING CARE OF THIS PATIENT: 50 minutes.    Deena Shaub M.D on 07/08/2016 at 2:49 PM  Between 7am to 6pm - Pager - [phone]  After 6pm go to [email or web address] - password [email or web address] [facility]  Office  [phone]  CC: Primary care physician; Enid Derry, MD

## 2016-07-08 NOTE — Progress Notes (Signed)
Family Meeting Note  Advance Directive:yes  Today a meeting took place with the Patient.and spouse    The following clinical team members were present during this meeting:MD  The following were discussed:Patient's diagnosis lymphoma receiving chemotherapy Anemia with GI bleed Protein calorie malnutrition  , Patient's progosis: Unable to determine and Goals for treatment: Full Code  Additional follow-up to be provided: Palliative care consulted for goals of care Oncology evaluation for overall prognosis  Time spent during discussion:18 minutes  Joline Encalada, MD

## 2016-07-09 ENCOUNTER — Inpatient Hospital Stay: Payer: Medicare HMO

## 2016-07-09 ENCOUNTER — Inpatient Hospital Stay: Payer: Medicare HMO | Admitting: Anesthesiology

## 2016-07-09 ENCOUNTER — Encounter
Admission: EM | Disposition: A | Discharge status: Other Acute Inpatient Hospital | Payer: Self-pay | Source: Home / Self Care | Attending: Internal Medicine

## 2016-07-09 DIAGNOSIS — N186 End stage renal disease: Secondary | ICD-10-CM

## 2016-07-09 DIAGNOSIS — K219 Gastro-esophageal reflux disease without esophagitis: Secondary | ICD-10-CM

## 2016-07-09 DIAGNOSIS — E041 Nontoxic single thyroid nodule: Secondary | ICD-10-CM

## 2016-07-09 DIAGNOSIS — Z940 Kidney transplant status: Secondary | ICD-10-CM

## 2016-07-09 DIAGNOSIS — K2980 Duodenitis without bleeding: Secondary | ICD-10-CM

## 2016-07-09 DIAGNOSIS — I129 Hypertensive chronic kidney disease with stage 1 through stage 4 chronic kidney disease, or unspecified chronic kidney disease: Secondary | ICD-10-CM

## 2016-07-09 DIAGNOSIS — C8330 Diffuse large B-cell lymphoma, unspecified site: Secondary | ICD-10-CM

## 2016-07-09 DIAGNOSIS — D500 Iron deficiency anemia secondary to blood loss (chronic): Secondary | ICD-10-CM

## 2016-07-09 DIAGNOSIS — Z87891 Personal history of nicotine dependence: Secondary | ICD-10-CM

## 2016-07-09 DIAGNOSIS — I272 Pulmonary hypertension, unspecified: Secondary | ICD-10-CM

## 2016-07-09 DIAGNOSIS — Z79899 Other long term (current) drug therapy: Secondary | ICD-10-CM

## 2016-07-09 DIAGNOSIS — N529 Male erectile dysfunction, unspecified: Secondary | ICD-10-CM

## 2016-07-09 DIAGNOSIS — Z9221 Personal history of antineoplastic chemotherapy: Secondary | ICD-10-CM

## 2016-07-09 DIAGNOSIS — Z808 Family history of malignant neoplasm of other organs or systems: Secondary | ICD-10-CM

## 2016-07-09 DIAGNOSIS — N400 Enlarged prostate without lower urinary tract symptoms: Secondary | ICD-10-CM

## 2016-07-09 DIAGNOSIS — K259 Gastric ulcer, unspecified as acute or chronic, without hemorrhage or perforation: Secondary | ICD-10-CM

## 2016-07-09 DIAGNOSIS — M1990 Unspecified osteoarthritis, unspecified site: Secondary | ICD-10-CM

## 2016-07-09 DIAGNOSIS — K269 Duodenal ulcer, unspecified as acute or chronic, without hemorrhage or perforation: Secondary | ICD-10-CM

## 2016-07-09 DIAGNOSIS — E785 Hyperlipidemia, unspecified: Secondary | ICD-10-CM

## 2016-07-09 DIAGNOSIS — K921 Melena: Principal | ICD-10-CM

## 2016-07-09 HISTORY — PX: ESOPHAGOGASTRODUODENOSCOPY (EGD) WITH PROPOFOL: SHX5813

## 2016-07-09 LAB — BASIC METABOLIC PANEL
ANION GAP: 2 — AB (ref 5–15)
BUN: 26 mg/dL — ABNORMAL HIGH (ref 6–20)
CO2: 28 mmol/L (ref 22–32)
Calcium: 9 mg/dL (ref 8.9–10.3)
Chloride: 109 mmol/L (ref 101–111)
Creatinine, Ser: 0.95 mg/dL (ref 0.61–1.24)
GFR calc Af Amer: >60 mL/min (ref >60)
GFR calc non Af Amer: >60 mL/min (ref >60)
GLUCOSE: 94 mg/dL (ref 65–99)
POTASSIUM: 4.2 mmol/L (ref 3.5–5.1)
Sodium: 139 mmol/L (ref 135–145)

## 2016-07-09 LAB — CBC
HEMATOCRIT: 22.3 % — AB (ref 40.0–52.0)
HEMOGLOBIN: 7.8 g/dL — AB (ref 13.0–18.0)
MCH: 30.4 pg (ref 26.0–34.0)
MCHC: 35 g/dL (ref 32.0–36.0)
MCV: 86.8 fL (ref 80.0–100.0)
Platelets: 132 10*3/uL — ABNORMAL LOW (ref 150–440)
RBC: 2.57 MIL/uL — ABNORMAL LOW (ref 4.40–5.90)
RDW: 17 % — ABNORMAL HIGH (ref 11.5–14.5)
WBC: 9.4 10*3/uL (ref 3.8–10.6)

## 2016-07-09 LAB — HEMOGLOBIN AND HEMATOCRIT, BLOOD
HEMATOCRIT: 24.4 % — AB (ref 40.0–52.0)
Hemoglobin: 8.3 g/dL — ABNORMAL LOW (ref 13.0–18.0)

## 2016-07-09 LAB — LACTATE DEHYDROGENASE: LDH: 109 U/L (ref 98–192)

## 2016-07-09 LAB — POTASSIUM: Potassium: 3.9 mmol/L (ref 3.5–5.1)

## 2016-07-09 LAB — MAGNESIUM: Magnesium: 1.7 mg/dL (ref 1.7–2.4)

## 2016-07-09 SURGERY — ESOPHAGOGASTRODUODENOSCOPY (EGD) WITH PROPOFOL
Anesthesia: General

## 2016-07-09 MED ORDER — LIDOCAINE HCL (PF) 2 % IJ SOLN
INTRAMUSCULAR | Status: DC | PRN
  Administered 2016-07-09: 60 mg via INTRADERMAL

## 2016-07-09 MED ORDER — PROPOFOL 500 MG/50ML IV EMUL
INTRAVENOUS | Status: DC | PRN
  Administered 2016-07-09: 150 ug/kg/min via INTRAVENOUS

## 2016-07-09 MED ORDER — PROPOFOL 500 MG/50ML IV EMUL
INTRAVENOUS | Status: AC
  Filled 2016-07-09: qty 50

## 2016-07-09 MED ORDER — ENSURE ENLIVE PO LIQD
237.0000 mL | Freq: Three times a day (TID) | ORAL | Status: DC
  Administered 2016-07-09 – 2016-07-13 (×8): 237 mL via ORAL

## 2016-07-09 MED ORDER — IPRATROPIUM-ALBUTEROL 0.5-2.5 (3) MG/3ML IN SOLN: 3.0000 mL | Freq: Once | RESPIRATORY_TRACT | Status: AC

## 2016-07-09 MED ORDER — IPRATROPIUM-ALBUTEROL 0.5-2.5 (3) MG/3ML IN SOLN
RESPIRATORY_TRACT | Status: AC
  Administered 2016-07-09: 3 mL
  Filled 2016-07-09: qty 3

## 2016-07-09 MED ORDER — BOOST / RESOURCE BREEZE PO LIQD
1.0000 | Freq: Three times a day (TID) | ORAL | Status: DC
  Administered 2016-07-09 (×2): 1 via ORAL

## 2016-07-09 MED ORDER — SODIUM CHLORIDE 0.9 % IV SOLN
200.0000 mg | INTRAVENOUS | Status: AC
  Administered 2016-07-09 – 2016-07-11 (×3): 200 mg via INTRAVENOUS
  Filled 2016-07-09 (×3): qty 10

## 2016-07-09 MED ORDER — SUCRALFATE 1 GM/10ML PO SUSP
1.0000 g | Freq: Three times a day (TID) | ORAL | Status: DC
  Administered 2016-07-09 – 2016-07-13 (×14): 1 g via ORAL
  Filled 2016-07-09 (×15): qty 10

## 2016-07-09 MED ORDER — PROPOFOL 10 MG/ML IV BOLUS
INTRAVENOUS | Status: DC | PRN
  Administered 2016-07-09: 50 mg via INTRAVENOUS

## 2016-07-09 MED ORDER — LIDOCAINE 2% (20 MG/ML) 5 ML SYRINGE
INTRAMUSCULAR | Status: AC
  Filled 2016-07-09: qty 5

## 2016-07-09 NOTE — Anesthesia Post-op Follow-up Note (Cosign Needed)
Anesthesia QCDR form completed.        

## 2016-07-09 NOTE — Op Note (Signed)
[facility] Patient Name: Matthew Brown Procedure Date: 07/09/2016 9:11 AM MRN: [id number] Account #: [id number] Date of Birth: 05-Jan-1941 Admit Type: Inpatient Age: 76 Room: [facility] ENDO ROOM 4 Gender: Male Note Status: Finalized Procedure:            Upper GI endoscopy Indications:          Melena Providers:            [name] MD, MD Referring MD:         Arnetha Courser (Referring MD) Medicines:            Monitored Anesthesia Care Complications:        No immediate complications. Procedure:            Pre-Anesthesia Assessment:                       - Prior to the procedure, a History and Physical was                        performed, and patient medications, allergies and                        sensitivities were reviewed. The patient's tolerance of                        previous anesthesia was reviewed.                       - The risks and benefits of the procedure and the                        sedation options and risks were discussed with the                        patient. All questions were answered and informed                        consent was obtained.                       - The risks and benefits of the procedure and the                        sedation options and risks were discussed with the                        patient. All questions were answered and informed                        consent was obtained.                       - ASA Grade Assessment: IV - A patient with severe                        systemic disease that is a constant threat to life.                       After obtaining informed consent, the endoscope was  passed under direct vision. Throughout the procedure,                        the patient's blood pressure, pulse, and oxygen                        saturations were monitored continuously. The Endoscope                        was introduced through the mouth, and advanced to the                     third part of duodenum. The upper GI endoscopy was                        accomplished with ease. The patient tolerated the                        procedure well. Findings:      The stomach was normal.      One mild benign-appearing, intrinsic stenosis was found 38 cm from the       incisors. This measured 1.3 cm (inner diameter) x 1 cm (in length) and       was traversed.      Localized moderate inflammation characterized by congestion (edema),       erythema, granularity and shallow ulcerations was found in the duodenal       bulb. Biopsies were taken with a cold forceps for histology.      Three non-bleeding linear duodenal ulcers with a clean ulcer base       (Forrest Class III) were found in the third portion of the duodenum. The       largest lesion was 5 mm in largest dimension. Biopsies were taken with a       cold forceps for histology. Impression:           - Normal stomach.                       - Benign-appearing esophageal stenosis.                       - Duodenitis. Biopsied.                       - Multiple non-bleeding duodenal ulcers with a clean                        ulcer base (Forrest Class III). Biopsied. Recommendation:       - Await pathology results.                       - Resume previous diet.                       - Continue present medications.                       - Avoid NSAID's                       Unsure etiology of multiple ulcers in the duodenum .  Check for Hpylori antigen in stool./                       If negative would require repeat EGd as an outpatient                        and if ulcers persist consider evaluation of ZE                        syndrome.                       Monitor CBC if drops further and signs of overt                        bleeding then get tagged RBC scan                       Suggest follow up as an out patient with Dr Gustavo Lah in                        view of large polyp of  the colon documented in last                        office note during last colonoscopy- may need repeat                        colonoscopy as outpatient.                       Continue PPI                       Carafate QID next 4-6 weeks Procedure Code(s):    --- Professional ---                       [phone], Esophagogastroduodenoscopy, flexible, transoral;                        with biopsy, single or multiple Diagnosis Code(s):    --- Professional ---                       K22.2, Esophageal obstruction                       K29.80, Duodenitis without bleeding                       K26.9, Duodenal ulcer, unspecified as acute or chronic,                        without hemorrhage or perforation                       K92.1, Melena (includes Hematochezia) CPT copyright 2016 American Medical Association. All rights reserved. The codes documented in this report are preliminary and upon coder review may  be revised to meet current compliance requirements. [name], MD [name] MD, MD 07/09/2016 10:00:08 AM This report has been signed electronically. Number of Addenda: 0 Note Initiated On: 07/09/2016 9:11 AM      [facility]

## 2016-07-09 NOTE — Progress Notes (Signed)
[facility] [place] NAME: Tierre Hellyer    MR#:  [id number]  DATE OF BIRTH:  29-Mar-1941  SUBJECTIVE:  CHIEF COMPLAINT:   Chief Complaint  Patient presents with  . Abnormal Lab  . GI Bleeding   Mild epigastric pain. EGD done today showed multiple duodenal ulcers. REVIEW OF SYSTEMS:    Review of Systems  Constitutional: Positive for malaise/fatigue. Negative for chills and fever.  HENT: Negative for sore throat.   Eyes: Negative for blurred vision, double vision and pain.  Respiratory: Negative for cough, hemoptysis, shortness of breath and wheezing.   Cardiovascular: Negative for chest pain, palpitations, orthopnea and leg swelling.  Gastrointestinal: Positive for abdominal pain. Negative for constipation, diarrhea, heartburn, nausea and vomiting.  Genitourinary: Negative for dysuria and hematuria.  Musculoskeletal: Negative for back pain and joint pain.  Skin: Negative for rash.  Neurological: Positive for weakness. Negative for sensory change, speech change, focal weakness and headaches.  Endo/Heme/Allergies: Does not bruise/bleed easily.  Psychiatric/Behavioral: Negative for depression. The patient is not nervous/anxious.     DRUG ALLERGIES:  No Known Allergies  VITALS:  Blood pressure (!) 116/43, pulse 74, temperature 97.7 F (36.5 C), temperature source Axillary, resp. rate 18, height 5\' 6"  (1.676 m), weight 55.3 kg (122 lb), SpO2 96 %.  PHYSICAL EXAMINATION:   Physical Exam  GENERAL:  76 y.o.-year-old patient lying in the bed with no acute distress.  EYES: Pupils equal, round, reactive to light and accommodation. No scleral icterus. Extraocular muscles intact.  HEENT: Head atraumatic, normocephalic. Oropharynx and nasopharynx clear.  NECK:  Supple, no jugular venous distention. No thyroid enlargement, no tenderness.  LUNGS: Normal breath sounds bilaterally, no wheezing, rales, rhonchi. No use of accessory muscles of  respiration.  CARDIOVASCULAR: S1, S2 normal. No murmurs, rubs, or gallops.  ABDOMEN: Soft, nontender, nondistended. Bowel sounds present. No organomegaly or mass.  EXTREMITIES: No cyanosis, clubbing or edema b/l.    NEUROLOGIC: Cranial nerves II through XII are intact. No focal Motor or sensory deficits b/l.   PSYCHIATRIC: The patient is alert and oriented x 3.  SKIN: No obvious rash, lesion, or ulcer.   LABORATORY PANEL:   CBC  Recent Labs Lab 07/09/16 0720  WBC 9.4  HGB 7.8*  HCT 22.3*  PLT 132*   ------------------------------------------------------------------------------------------------------------------ Chemistries   Recent Labs Lab 07/08/16 1255 07/09/16 0720  NA 137 139  K 4.3 4.2  CL 105 109  CO2 27 28  GLUCOSE 154* 94  BUN 37* 26*  CREATININE 1.21 0.95  CALCIUM 10.1 9.0  AST 24  --   ALT 10*  --   ALKPHOS 73  --   BILITOT 0.6  --    ------------------------------------------------------------------------------------------------------------------  Cardiac Enzymes No results for input(s): TROPONINI in the last 168 hours. ------------------------------------------------------------------------------------------------------------------  RADIOLOGY:  Dg Chest Portable 1 View  Result Date: 07/08/2016 CLINICAL DATA:  Epigastric pain. EXAM: PORTABLE CHEST 1 VIEW COMPARISON:  05/21/2016. FINDINGS: PowerPort catheter noted with lead tip projected over the right atrium. Cardiomegaly with mild pulmonary vascular prominence and bilateral interstitial prominence. A mild component congestive heart failure cannot be excluded. Findings have improved from prior study of 05/21/2016. IMPRESSION: 1. PowerPort catheter noted in stable position. 2. Cardiomegaly with mild bilateral from interstitial prominence. A very mild component congestive heart failure cannot be excluded. Findings are significantly improved from prior study of 05/21/2016. Electronically Signed   By: Marcello Moores   Register   On: 07/08/2016 14:42     ASSESSMENT  AND PLAN:   76 year old male with lymphoma currently on chemotherapy who had a follow-up echo in our clinic GI for anemia by the request of Dr. Mike Gip and has been having dark-colored stools which are guaiac positive.  1. Acute on chronic anemia: Transfused 1 unit PRBC Due to GI bleed EGD showed nonbleeding duodenal ulcers. Continue IV PPI. Good diet. Discussed with Dr. Vicente Males. Repeat hemoglobin in the morning.  4. History of lymphoma  5. Persistent atrial fibrillation Coumadin has been discontinued due to GI bleed  6. History of renal transplant: Continue Prograf and prednisone  7. BPH on tamsulosin and finasteride  8. Protein calorie malnutrition: Continue feeding supplements.   All the records are reviewed and case discussed with Care Management/Social Workerr. Management plans discussed with the patient, family and they are in agreement.  CODE STATUS: FULL CODE  DVT Prophylaxis: SCDs  TOTAL TIME TAKING CARE OF THIS PATIENT: 40 minutes.   POSSIBLE D/C IN 1-2 DAYS, DEPENDING ON CLINICAL CONDITION.  Hillary Bow R M.D on 07/09/2016 at 3:16 PM  Between 7am to 6pm - Pager - [phone]  After 6pm go to [email or web address] - password [email or web address] [facility]  Office  [phone]  CC: Primary care physician; Enid Derry, MD  Note: This dictation was prepared with Dragon dictation along with smaller phrase technology. Any transcriptional errors that result from this process are unintentional.

## 2016-07-09 NOTE — Progress Notes (Signed)
Initial Nutrition Assessment  DOCUMENTATION CODES:   Severe malnutrition in context of chronic illness  INTERVENTION:   Boost Breeze po TID, each supplement provides 250 kcal and 9 grams of protein  Ensure Enlive po BID, each supplement provides 350 kcal and 20 grams of protein  NUTRITION DIAGNOSIS:   Malnutrition related to cancer and cancer related treatments, chronic illness as evidenced by severe depletion of muscle mass, severe depletion of body fat.  GOAL:   Patient will meet greater than or equal to 90% of their needs  MONITOR:   PO intake, Supplement acceptance, I & O's, Labs  REASON FOR ASSESSMENT:   Consult Assessment of nutrition requirement/status  ASSESSMENT:   76 y.o. male with a known history of End-stage renal disease status post right kidney transplant, lymphoma on chemotherapy and persistent atrial fibrillation discharge from the hospital January 6 with viral gastroenteritis who presented to the GI clinic for evaluation of anemia. Patient reports that he has epigastric pain with poor appetite. 2 days ago he noted dark colored stools and bright red blood.    Met with pt in room today. Pt reports poor appetite ever since last admission two weeks ago. Pt currently eating 100% clear liquid diet. Per chart, pt is weight stable. Pt with possible GI bleed. Plan is for pt to EGD today.   Medications reviewed and include: allopurinol, protonix, prednisone  Labs reviewed: BUN 37(H), Alb 3.1(L) Wbc- 15.9(H), Hgb 5.8(L), Hct 17.3(L)  Nutrition-Focused physical exam completed. Findings are severe fat depletion, severe muscle depletion, and mild edema.   Diet Order:  Diet full liquid Room service appropriate? Yes; Fluid consistency: Thin  Skin:  Reviewed, no issues  Last BM:  1/16  Height:   Ht Readings from Last 1 Encounters:  07/08/16 5' 6"  (1.676 m)    Weight:   Wt Readings from Last 1 Encounters:  07/08/16 122 lb (55.3 kg)    Ideal Body Weight:  59  kg  BMI:  Body mass index is 19.69 kg/m.  Estimated Nutritional Needs:   Kcal:  1700-2000kcal/day   Protein:  78-89g/day   Fluid:  >1.7L/day   EDUCATION NEEDS:   Education needs no appropriate at this time  Koleen Distance, RD, LDN Pager #[phone]

## 2016-07-09 NOTE — Anesthesia Postprocedure Evaluation (Signed)
Anesthesia Post Note  Patient: Matthew Brown  Procedure(s) Performed: Procedure(s) (LRB): ESOPHAGOGASTRODUODENOSCOPY (EGD) WITH PROPOFOL (N/A)  Patient location during evaluation: Endoscopy Anesthesia Type: General Level of consciousness: awake and alert Pain management: pain level controlled Vital Signs Assessment: post-procedure vital signs reviewed and stable Respiratory status: spontaneous breathing, nonlabored ventilation, respiratory function stable and patient connected to nasal cannula oxygen Cardiovascular status: blood pressure returned to baseline and stable Postop Assessment: no signs of nausea or vomiting Anesthetic complications: no     Last Vitals:  Vitals:   07/09/16 1020 07/09/16 1030  BP: (!) 127/50 (!) 129/50  Pulse:  74  Resp:  18  Temp:      Last Pain:  Vitals:   07/09/16 0955  TempSrc: Tympanic  PainSc:                  Precious Haws Nkechi Linehan

## 2016-07-09 NOTE — Anesthesia Preprocedure Evaluation (Signed)
Anesthesia Evaluation  Patient identified by MRN, date of birth, ID band Patient awake    Reviewed: Allergy & Precautions, H&P , NPO status , Patient's Chart, lab work & pertinent test results  History of Anesthesia Complications Negative for: history of anesthetic complications  Airway Mallampati: III  TM Distance: >3 FB Neck ROM: limited    Dental  (+) Poor Dentition, Missing, Edentulous Upper, Edentulous Lower   Pulmonary neg shortness of breath, pneumonia, resolved, former smoker,    Pulmonary exam normal breath sounds clear to auscultation       Cardiovascular hypertension, (-) angina+ Peripheral Vascular Disease  (-) Past MI Normal cardiovascular exam+ dysrhythmias  Rhythm:regular Rate:Normal     Neuro/Psych  Headaches,  Neuromuscular disease negative psych ROS   GI/Hepatic Neg liver ROS, GERD  Controlled,  Endo/Other    Renal/GU Renal disease  negative genitourinary   Musculoskeletal  (+) Arthritis ,   Abdominal   Peds  Hematology negative hematology ROS (+)   Anesthesia Other Findings Patient is NPO appropriate and reports no nausea or vomiting today.   Past Medical History: No date: Benign prostatic hypertrophy 10/19/2015: Chronic headache No date: ED (erectile dysfunction) No date: End stage renal disease (HCC) No date: Essential hypertension No date: GERD (gastroesophageal reflux disease) No date: GIB (gastrointestinal bleeding)     Comment: a. [id number] s/p R colic artery embolization;  b.              02/2015 EGD: duod ulcerative mass->Bx notable               for coagulative necrosis - ? ischemia vs               thrombosis-->coumadin d/c'd. No date: Gout No date: Hearing loss No date: Hemorrhoids No date: Hyperlipidemia No date: Lymphoma ([place]) 2017: Lymphoma ([place]) 06/06/2016: Multiple thyroid nodules     Comment: Noted on carotid US; dedicated US to be               ordered by staff No  date: Osteoarthrosis, unspecified whether generalize* No date: Persistent atrial fibrillation ([place])     Comment: a. CHA2DS2VASc = 3-->coumadin d/c'd 02/2015 2/2              recurrent GIB. No date: Prostatitis No date: Pulmonary hypertension     Comment: a. 10/2014 Echo: EF 60-65%, mild to mod MR,               mildly dil LA, nl RV, PASP 14mmHg. No date: Renal transplant recipient No date: Ulcers of both great toes [facility])  Past Surgical History: No date: BACK SURGERY 03/13/15: ESOPHAGOGASTRODUODENOSCOPY     Comment: severe esophagitis, ulcerated mass 1974: HERNIA REPAIR 05/07/2016: PERIPHERAL VASCULAR CATHETERIZATION N/A     Comment: Procedure: Porta Cath Insertion;  Surgeon:               Algernon Huxley, MD;  Location: [place] [facility]               LAB;  Service: Cardiovascular;  Laterality:               N/A; No date: PROSTATE ABLATION No date: STOMACH SURGERY     Comment: blood vessel burst No date: THROAT SURGERY No date: TOTAL KNEE ARTHROPLASTY  BMI    Body Mass Index:  19.69 kg/m      Reproductive/Obstetrics negative OB ROS  Anesthesia Physical Anesthesia Plan  ASA: IV  Anesthesia Plan: General   Post-op Pain Management:    Induction:   Airway Management Planned:   Additional Equipment:   Intra-op Plan:   Post-operative Plan:   Informed Consent: I have reviewed the patients History and Physical, chart, labs and discussed the procedure including the risks, benefits and alternatives for the proposed anesthesia with the patient or authorized representative who has indicated his/her understanding and acceptance.   Dental Advisory Given  Plan Discussed with: Anesthesiologist, CRNA and Surgeon  Anesthesia Plan Comments:         Anesthesia Quick Evaluation

## 2016-07-09 NOTE — Consult Note (Signed)
Matthew Brown CONSULT NOTE  Patient Care Team: Arnetha Courser, MD as PCP - General (Family Medicine) Claybon Jabs, MD (Gastroenterology) Dewitt Rota, MD (Gastroenterology) Jolene Schimke, MD as Referring Physician (Nephrology) Abran Duke, MD (Internal Medicine) Larina Earthly, MD (Ophthalmology) Faustino Congress, MD as Referring Physician (Ophthalmology) Jolene Schimke, MD as Referring Physician (Nephrology) Sharlet Salina, MD as Referring Physician (Physical Medicine and Rehabilitation) Merlene Morse, MD as Referring Physician (Orthopedic Surgery)  CHIEF COMPLAINTS/PURPOSE OF CONSULTATION: Anemia/diffuse large B-cell lymphoma  HISTORY OF PRESENTING ILLNESS:  Matthew Brown 76 y.o.  male  s/p renal transplant (2007) with a post-transplant lymphoproliferative disorder, stage IV diffuse large B cell lymphoma- currently status post mini R CHOP chemotherapy. Patient is currently status post 3 cycles of treatment. Given the high risk for CNS disease- patient is on prophylactic intrathecal methotrexate.  Patient is currently admitted to hospital for black colored stools/ and noted to have a hemoglobin of 5.8. Patient is currently status post 2 units of blood transfusion hemoglobin 7.8 this morning. Patient had EGD that showed- nonbleeding ulcers in the stomach. Biopsy pending.  He denies any more loose stools or black stools this morning . He feels little stronger. Denies any nausea vomiting. Denies any fevers or chills.  ROS: A complete 10 point review of system is done which is negative except mentioned above in history of present illness  MEDICAL HISTORY:  Past Medical History:  Diagnosis Date  . Benign prostatic hypertrophy   . Chronic headache 10/19/2015  . ED (erectile dysfunction)   . End stage renal disease ([place])   . Essential hypertension   . GERD (gastroesophageal reflux disease)   . GIB (gastrointestinal bleeding)    a. [id number] s/p R colic artery  embolization;  b. 02/2015 EGD: duod ulcerative mass->Bx notable for coagulative necrosis - ? ischemia vs thrombosis-->coumadin d/c'd.  . Gout   . Hearing loss   . Hemorrhoids   . Hyperlipidemia   . Lymphoma ([place])   . Lymphoma ([place]) 2017  . Multiple thyroid nodules 06/06/2016   Noted on carotid US; dedicated US to be ordered by staff  . Osteoarthrosis, unspecified whether generalized or localized, lower leg   . Persistent atrial fibrillation ([place])    a. CHA2DS2VASc = 3-->coumadin d/c'd 02/2015 2/2 recurrent GIB.  [name] Prostatitis   . Pulmonary hypertension    a. 10/2014 Echo: EF 60-65%, mild to mod MR, mildly dil LA, nl RV, PASP 23mmHg.  [name] Renal transplant recipient   . Ulcers of both great toes ([place])     SURGICAL HISTORY: Past Surgical History:  Procedure Laterality Date  . BACK SURGERY    . ESOPHAGOGASTRODUODENOSCOPY  03/13/15   severe esophagitis, ulcerated mass  . HERNIA REPAIR  1974  . PERIPHERAL VASCULAR CATHETERIZATION N/A 05/07/2016   Procedure: Glori Luis Cath Insertion;  Surgeon: Algernon Huxley, MD;  Location: [place] [facility] LAB;  Service: Cardiovascular;  Laterality: N/A;  . PROSTATE ABLATION    . STOMACH SURGERY     blood vessel burst  . THROAT SURGERY    . TOTAL KNEE ARTHROPLASTY      SOCIAL HISTORY: Social History   Social History  . Marital status: Divorced    Spouse name: N/A  . Number of children: N/A  . Years of education: N/A   Occupational History  . Retired Retired   Social History Main Topics  . Smoking status: Former Smoker    Packs/day: 1.00    Years: 25.00  Types: Cigarettes    Quit date: 06/23/1978  . Smokeless tobacco: Never Used  . Alcohol use No  . Drug use: No  . Sexual activity: Yes   Other Topics Concern  . Not on file   Social History Narrative   Divorced   Does not get regular exercise    FAMILY HISTORY: Family History  Problem Relation Age of Onset  . Cancer Mother     throat  . Diabetes Brother   . Heart disease Brother    . Stroke Brother   . Hypertension Brother   . Diabetes Sister   . Heart disease Sister   . Hypertension Sister   . Diabetes Sister   . Diabetes Brother   . COPD Neg Hx   . Kidney disease Neg Hx   . Prostate cancer Neg Hx     ALLERGIES:  has No Known Allergies.  MEDICATIONS:  Current Facility-Administered Medications  Medication Dose Route Frequency Provider Last Rate Last Dose  . acetaminophen (TYLENOL) tablet 650 mg  650 mg Oral Q6H PRN Bettey Costa, MD       Or  . acetaminophen (TYLENOL) suppository 650 mg  650 mg Rectal Q6H PRN Sital Mody, MD      . allopurinol (ZYLOPRIM) tablet 100 mg  100 mg Oral Daily Sital Mody, MD      . feeding supplement (BOOST / RESOURCE BREEZE) liquid 1 Container  1 Container Oral TID BM Hillary Bow, MD   1 Container at 07/09/16 1413  . feeding supplement (ENSURE ENLIVE) (ENSURE ENLIVE) liquid 237 mL  237 mL Oral TID BM Srikar Sudini, MD   237 mL at 07/09/16 1413  . finasteride (PROSCAR) tablet 5 mg  5 mg Oral Daily Sital Mody, MD      . ondansetron (ZOFRAN) tablet 4 mg  4 mg Oral Q6H PRN Bettey Costa, MD       Or  . ondansetron (ZOFRAN) injection 4 mg  4 mg Intravenous Q6H PRN Bettey Costa, MD      . oxybutynin (DITROPAN-XL) 24 hr tablet 5 mg  5 mg Oral Daily Bettey Costa, MD   5 mg at 07/08/16 2313  . oxyCODONE (Oxy IR/ROXICODONE) immediate release tablet 5 mg  5 mg Oral Q4H PRN Bettey Costa, MD      . pantoprazole (PROTONIX) injection 40 mg  40 mg Intravenous Q12H Bettey Costa, MD   40 mg at 07/08/16 2352  . predniSONE (DELTASONE) tablet 5 mg  5 mg Oral Daily Sital Mody, MD      . senna-docusate (Senokot-S) tablet 1 tablet  1 tablet Oral QHS PRN Bettey Costa, MD      . sucralfate (CARAFATE) 1 GM/10ML suspension 1 g  1 g Oral TID WC & HS [name], MD   1 g at 07/09/16 1147  . tacrolimus (PROGRAF) capsule 3 mg  3 mg Oral BID Bettey Costa, MD   3 mg at 07/08/16 2314  . tamsulosin (FLOMAX) capsule 0.4 mg  0.4 mg Oral Daily Bettey Costa, MD        Facility-Administered Medications Ordered in Other Encounters  Medication Dose Route Frequency Provider Last Rate Last Dose  . methotrexate (PF) 12 mg in sodium chloride 0.9 % INTRATHECAL chemo injection   Intrathecal Once Lequita Asal, MD          .  PHYSICAL EXAMINATION:  Vitals:   07/09/16 1416 07/09/16 1416  BP:  (!) 116/43  Pulse:  74  Resp:  18  Temp:  97.7 F (36.5 C)    Filed Weights   07/08/16 1243  Weight: 122 lb (55.3 kg)    GENERAL: Well-nourished well-developed; Alert, no distress and comfortable.   Accompanied by his wife. EYES: no pallor or icterus OROPHARYNX: no thrush or ulceration. NECK: supple, no masses felt LYMPH:  no palpable lymphadenopathy in the cervical, axillary or inguinal regions LUNGS: decreased breath sounds to auscultation at bases and  No wheeze or crackles HEART/CVS: regular rate & rhythm and no murmurs; No lower extremity edema ABDOMEN: abdomen soft, non-tender and normal bowel sounds Musculoskeletal:no cyanosis of digits and no clubbing  PSYCH: alert & oriented x 3 with fluent speech NEURO: no focal motor/sensory deficits SKIN:  no rashes or significant lesions  LABORATORY DATA:  I have reviewed the data as listed Lab Results  Component Value Date   WBC 9.4 07/09/2016   HGB 7.8 (L) 07/09/2016   HCT 22.3 (L) 07/09/2016   MCV 86.8 07/09/2016   PLT 132 (L) 07/09/2016    Recent Labs  06/20/16 0842 06/25/16 1739  07/07/16 1004 07/08/16 1255 07/09/16 0720  NA 136 133*  < > 136 137 139  K 3.5 4.4  < > 3.7 4.3 4.2  CL 108 98*  < > 104 105 109  CO2 22 29  < > 28 27 28   GLUCOSE 149* 214*  < > 143* 154* 94  BUN 17 34*  < > 32* 37* 26*  CREATININE 0.99 0.84  < > 1.27* 1.21 0.95  CALCIUM 9.3 10.3  < > 9.0 10.1 9.0  GFRNONAA >60 >60  < > 53* 56* >60  GFRAA >60 >60  < > >60 >60 >60  PROT 5.8* 6.4*  --   --  5.2*  --   ALBUMIN 3.5 3.9  --   --  3.1*  --   AST 24 27  --   --  24  --   ALT 10* 31  --   --  10*  --    ALKPHOS 98 145*  --   --  73  --   BILITOT 0.5 1.3*  --   --  0.6  --   < > = values in this interval not displayed.  RADIOGRAPHIC STUDIES: I have personally reviewed the radiological images as listed and agreed with the findings in the report. Ct Abdomen Pelvis Wo Contrast  Result Date: 06/25/2016 CLINICAL DATA:  Lymphoma on chemotherapy. Treated 1 week ago now complaining of lower abdominal pain with diarrhea, nausea and vomiting. EXAM: CT ABDOMEN AND PELVIS WITHOUT CONTRAST TECHNIQUE: Multidetector CT imaging of the abdomen and pelvis was performed following the standard protocol without IV contrast. COMPARISON:  06/13/2016 CT lumbar spine, PET-CT 04/28/2016 FINDINGS: Lower chest: Cardiomegaly with coronary arteriosclerosis. Port catheter tip in the cavoatrial junction. Small bilateral pleural effusions right greater than left with adjacent atelectasis. Minimal lingular atelectasis. Thoracic aortic atherosclerosis. Hepatobiliary: The unenhanced liver demonstrates no focal mass. Punctate calcification in the right hepatic lobe possibly a small granuloma or vascular calcification. Gallbladder does not appear to be distended. No gallstones are seen. Pancreas: Atrophic pancreas. Spleen: No splenomegaly. Adrenals/Urinary Tract: Normal appearing bilateral adrenal glands. Atrophic bilateral kidneys with renovascular calcifications bilaterally. Small cortical and exophytic lesions are noted on the right, measuring between 13 and 16 mm. The exophytic lesion is not simple by CT may represent a complex or proteinaceous cyst. Further characterization is not possible given lack of IV contrast. Pelvic right kidney is noted. Stomach/Bowel: Contrast distended slightly  thickened stomach with contrast filled mildly thickened proximal small bowel loops consistent with gastroenteritis. Oral contrast reaches large bowel. Normal-appearing appendix. Descending colonic and sigmoid diverticulosis without acute diverticulitis.  Vascular/Lymphatic: The mesenteric and retroperitoneal periaortic adenopathy appears to have slightly decreased in size since prior PET-CT from 04/28/2016. Right para-aortic lymph node measured at the same level previously now measures 1.6 cm in thickness versus 3.2 cm previously. Left para- aortic adenopathy now measures approximately 3.8 cm versus 5 cm previously. Mesenteric adenopathy now measures 1.7 cm versus 2.5 cm. Reproductive: Normal sized partially calcified prostate. Left inguinal surgical clips are present. Other: No free air or free fluid. Musculoskeletal: Solid osseous fusion of the posterior elements from L3 through S1 with cerclage wires in place. Degenerative disc disease with height loss from L1 through L3. Subtle heterogeneity of the L3 through L5 vertebral bodies as previously described which cannot exclude lytic disease. Osteoarthritis of hips with subchondral cysts noted about both hips. Lytic lesion of the left sacrum with soft tissue component. IMPRESSION: Fluid-filled distended and slightly thickened appearance of the stomach and proximal small bowel consistent with gastroenteritis. No bowel obstruction noted. Interval decrease in size of retroperitoneal and mesenteric lymphadenopathy. Small bilateral pleural effusions right greater than left. Right transplanted pelvic kidney with atrophic bilateral native kidneys. Small stable right-sided renal cysts. Lytic lucencies of L3, L4 and L5 and the left sacrum consistent osteolytic metastatic disease. Electronically Signed   By: Ashley Royalty M.D.   On: 06/25/2016 21:55   Ct Lumbar Spine Wo Contrast  Result Date: 06/13/2016 CLINICAL DATA:  [place].  Lumbar spine surgery. EXAM: CT LUMBAR SPINE WITHOUT CONTRAST TECHNIQUE: Multidetector CT imaging of the lumbar spine was performed without intravenous contrast administration. Multiplanar CT image reconstructions were also generated. COMPARISON:  None. FINDINGS: Segmentation: 5 lumbar type  vertebrae. Alignment: No static listhesis. Levocurvature of the thoracolumbar spine. Vertebrae: No acute fracture. Relative heterogeneity in the L4 and L5 vertebral bodies concerning for malignancy when correlated with recent PET-CT dated 04/30/2016. Mild degenerative changes of bilateral sacroiliac joints. Paraspinal and other soft tissues: Abdominal aortic atherosclerosis. Bilateral renal atrophy. Large left periaortic soft tissue mass likely reflecting abnormal lymphoid tissue measuring approximately 5.3 x 4 cm in transverse dimension consistent with patient's history of lymphoma. Spinal cord is suboptimally evaluated secondary to poor resolution on CT. Disc levels: Degenerative disc disease with disc height loss at L2-3. Mild disc calcification at L3-4, L4-5 and L5-S1. Solid osseous fusion of the posterior elements from L3 through S1 with cerclage wires in place. From L3 through S1 there is solid osseous fusion with no interspinous spaces to perform a lumbar puncture. Bilateral facet arthropathy at L1-2 and L2-3. IMPRESSION: 1. Relative heterogeneity in the L4 and L5 vertebral bodies concerning for malignancy when correlated with recent PET-CT dated 04/30/2016. 2. Large left periaortic soft tissue mass likely reflecting abnormal lymphoid tissue measuring approximately 5.3 x 4 cm in transverse dimension consistent with patient's history of lymphoma. 3. Solid osseous fusion of the posterior elements from L3 through S1 with cerclage wires in place. From L3 through S1 there is solid osseous fusion with no interspinous spaces to perform a lumbar puncture. Electronically Signed   By: Kathreen Devoid   On: 06/13/2016 14:02   Dg Chest Portable 1 View  Result Date: 07/08/2016 CLINICAL DATA:  Epigastric pain. EXAM: PORTABLE CHEST 1 VIEW COMPARISON:  05/21/2016. FINDINGS: PowerPort catheter noted with lead tip projected over the right atrium. Cardiomegaly with mild pulmonary vascular prominence and bilateral interstitial  prominence. A mild component congestive heart failure cannot be excluded. Findings have improved from prior study of 05/21/2016. IMPRESSION: 1. PowerPort catheter noted in stable position. 2. Cardiomegaly with mild bilateral from interstitial prominence. A very mild component congestive heart failure cannot be excluded. Findings are significantly improved from prior study of 05/21/2016. Electronically Signed   By: Marcello Moores  Register   On: 07/08/2016 14:42   Dg Cyndy Freeze Guided Loc Of Needle/cath Tip For Spinal Inject Lt  Result Date: 06/19/2016 CLINICAL DATA:  Diffuse B-cell lymphoma. Diagnostic lumbar puncture for lymphoma evaluation. EXAM: DIAGNOSTIC LUMBAR PUNCTURE UNDER FLUOROSCOPIC GUIDANCE FLUOROSCOPY TIME:  Fluoroscopy Time:  0.3 minutes Radiation Exposure Index (if provided by the fluoroscopic device): 5.3 mGy Number of Acquired Spot Images: 0 PROCEDURE: Informed consent was obtained from the patient prior to the procedure, including potential complications of headache, allergy, and pain was obtained by Dr. Cari Caraway. With the patient prone, the lower back was prepped with Betadine. 1% Lidocaine was used for local anesthesia. Lumbar puncture was performed at the L2-3 level using a 22 gauge needle with return of clear CSF. 14 ml of CSF were obtained for laboratory studies. The patient tolerated the procedure well and there were no apparent complications. IMPRESSION: Successful fluoroscopic guided lumbar puncture by Dr. Cari Caraway. Electronically Signed   By: Kathreen Devoid   On: 06/19/2016 12:53    ASSESSMENT & PLAN:   # 76 year old male patient with post kidney transplantn diffuse B-cell lymphoma on min- R-CHOP chemotherapy- currently admitted to the hospital for melena/GI bleed  # Acute anemia on chronic anemia [multifactorial-chemotherapy CKD with GI bleed]Severe anemia hemoglobin down to 5.8 status post transfusion currently 7.8. Status post endoscopy. Most recent iron saturation approximately 2  months ago was 15. I recommend IV Venofer 2 at least. Discussed the potential infusion reactions with Venofer. He is agreeable. Transfuse to keep the hemoglobin above 8.  # Diffuse large B-cell lymphoma- status post 3 cycles of mini RCHOP. Patient is due for cycle #4 this week. However given the acuity of above issues- we'll plan to hold chemotherapy this week. Patient also due for intrathecal methotrexate this week. However we will hold that procedure also this week.  # I discussed the patient's care with Dr. Mike Gip. Also reviewed with Dr.Sudini.   Thank you Dr. Darvin Neighbours for allowing me to participate in the care of your pleasant patient. Please do not hesitate to contact me with questions or concerns in the interim.     Cammie Sickle, MD 07/09/2016 3:29 PM

## 2016-07-09 NOTE — H&P (Signed)
Matthew Bellows MD [address] [place], [place] [address] Phone: [phone] Fax : [phone]  Primary Care Physician:  Enid Derry, MD Primary Gastroenterologist:  Dr. Jonathon Brown   Pre-Procedure History & Physical: HPI:  Matthew Brown is a 76 y.o. male is here for an endoscopy.No further bowel movements over night   CBC Latest Ref Rng & Units 07/09/2016 07/08/2016 07/08/2016  WBC 3.8 - 10.6 K/uL 9.4 - 15.9(H)  Hemoglobin 13.0 - 18.0 g/dL 7.8(L) 5.8(L) 7.6(L)  Hematocrit 40.0 - 52.0 % 22.3(L) 17.3(L) 23.6(L)  Platelets 150 - 440 K/uL 132(L) - 187      Past Medical History:  Diagnosis Date  . Benign prostatic hypertrophy   . Chronic headache 10/19/2015  . ED (erectile dysfunction)   . End stage renal disease ([place])   . Essential hypertension   . GERD (gastroesophageal reflux disease)   . GIB (gastrointestinal bleeding)    a. [id number] s/p R colic artery embolization;  b. 02/2015 EGD: duod ulcerative mass->Bx notable for coagulative necrosis - ? ischemia vs thrombosis-->coumadin d/c'd.  . Gout   . Hearing loss   . Hemorrhoids   . Hyperlipidemia   . Lymphoma ([place])   . Lymphoma ([place]) 2017  . Multiple thyroid nodules 06/06/2016   Noted on carotid US; dedicated US to be ordered by staff  . Osteoarthrosis, unspecified whether generalized or localized, lower leg   . Persistent atrial fibrillation ([place])    a. CHA2DS2VASc = 3-->coumadin d/c'd 02/2015 2/2 recurrent GIB.  [name] Prostatitis   . Pulmonary hypertension    a. 10/2014 Echo: EF 60-65%, mild to mod MR, mildly dil LA, nl RV, PASP 32mmHg.  [name] Renal transplant recipient   . Ulcers of both great toes [facility])     Past Surgical History:  Procedure Laterality Date  . BACK SURGERY    . ESOPHAGOGASTRODUODENOSCOPY  03/13/15   severe esophagitis, ulcerated mass  . HERNIA REPAIR  1974  . PERIPHERAL VASCULAR CATHETERIZATION N/A 05/07/2016   Procedure: Glori Luis Cath Insertion;  Surgeon: Algernon Huxley, MD;  Location: [facility];   Service: Cardiovascular;  Laterality: N/A;  . PROSTATE ABLATION    . STOMACH SURGERY     blood vessel burst  . THROAT SURGERY    . TOTAL KNEE ARTHROPLASTY      Prior to Admission medications   Medication Sig Start Date End Date Taking? Authorizing Provider  acetaminophen (TYLENOL) 325 MG tablet Take 2 tablets (650 mg total) by mouth every 6 (six) hours as needed for mild pain (or Fever >/= 101). 05/13/16  Yes Nicholes Mango, MD  albuterol (PROAIR HFA) 108 (90 BASE) MCG/ACT inhaler Inhale 1-2 puffs into the lungs every 4 (four) hours as needed.  02/20/14  Yes Historical Provider, MD  allopurinol (ZYLOPRIM) 100 MG tablet Take 100 mg by mouth daily.     Yes Historical Provider, MD  COLCRYS 0.6 MG tablet Take 1 tablet by mouth 2 (two) times daily as needed. 05/03/16  Yes Historical Provider, MD  diphenhydrAMINE (BENADRYL) 25 mg capsule Take 1 capsule (25 mg total) by mouth at bedtime as needed for sleep. 05/13/16  Yes Nicholes Mango, MD  finasteride (PROSCAR) 5 MG tablet Take 5 mg by mouth daily.     Yes Historical Provider, MD  furosemide (LASIX) 80 MG tablet Take 1 tablet by mouth daily as needed. 05/06/16  Yes Historical Provider, MD  hydroxypropyl methylcellulose (ISOPTO TEARS) 2.5 % ophthalmic solution Place 1 drop into both eyes as needed.  Yes Historical Provider, MD  Multiple Vitamin (MULTIVITAMIN) tablet Take 1 tablet by mouth daily.     Yes Historical Provider, MD  ondansetron (ZOFRAN) 4 MG tablet Take 1 tablet (4 mg total) by mouth every 6 (six) hours as needed for nausea. 05/22/16  Yes Vaughan Basta, MD  oxybutynin (DITROPAN-XL) 5 MG 24 hr tablet Take 5 mg by mouth daily.   Yes Historical Provider, MD  pantoprazole (PROTONIX) 40 MG tablet Take 1 tablet (40 mg total) by mouth 2 (two) times daily. 06/28/16  Yes Richard Leslye Peer, MD  predniSONE (DELTASONE) 20 MG tablet Take 70 mg (3.5 pills) daily for 5 days after each cycle of chemotherapy. 05/30/16  Yes Lequita Asal, MD  predniSONE  (DELTASONE) 5 MG tablet Take 5 mg by mouth daily.  11/23/13  Yes Historical Provider, MD  prochlorperazine (COMPAZINE) 10 MG tablet Take 1 tablet by mouth daily as needed. 05/17/16  Yes Historical Provider, MD  simethicone (MYLICON) 80 MG chewable tablet Chew 1 tablet (80 mg total) by mouth every 6 (six) hours as needed for flatulence. 06/28/16  Yes Richard Leslye Peer, MD  sucralfate (CARAFATE) 1 g tablet Take 1 tablet (1 g total) by mouth 4 (four) times daily. Resume taking after one week- once finished taking oral levaquine. ( to avoid interaction.) 05/22/16  Yes Vaughan Basta, MD  tacrolimus (PROGRAF) 1 MG capsule Take 3 mg by mouth 2 (two) times daily. Reported on 08/16/2015   Yes Historical Provider, MD  Tamsulosin HCl (FLOMAX) 0.4 MG CAPS Take 0.4 mg by mouth daily.     Yes Historical Provider, MD  feeding supplement, ENSURE ENLIVE, (ENSURE ENLIVE) LIQD Take 237 mLs by mouth 3 (three) times daily between meals. 05/13/16   Nicholes Mango, MD  HYDROcodone-acetaminophen (NORCO) 5-325 MG tablet Take 1 tablet by mouth every 4 (four) hours as needed for moderate pain. Patient not taking: Reported on 06/25/2016 05/02/16   Lequita Asal, MD  lactulose (CHRONULAC) 10 GM/15ML solution Take 7.5-15 mLs (5-10 g total) by mouth daily as needed for mild constipation. 11/14/15   Arnetha Courser, MD  oxyCODONE-acetaminophen (ROXICET) 5-325 MG tablet Take 0.5-1 tablets by mouth every 6 (six) hours as needed for severe pain. Patient not taking: Reported on 07/08/2016 05/26/16   Lequita Asal, MD    Allergies as of 07/08/2016  . (No Known Allergies)    Family History  Problem Relation Age of Onset  . Cancer Mother     throat  . Diabetes Brother   . Heart disease Brother   . Stroke Brother   . Hypertension Brother   . Diabetes Sister   . Heart disease Sister   . Hypertension Sister   . Diabetes Sister   . Diabetes Brother   . COPD Neg Hx   . Kidney disease Neg Hx   . Prostate cancer Neg Hx      Social History   Social History  . Marital status: Divorced    Spouse name: N/A  . Number of children: N/A  . Years of education: N/A   Occupational History  . Retired Retired   Social History Main Topics  . Smoking status: Former Smoker    Packs/day: 1.00    Years: 25.00    Types: Cigarettes    Quit date: 06/23/1978  . Smokeless tobacco: Never Used  . Alcohol use No  . Drug use: No  . Sexual activity: Yes   Other Topics Concern  . Not on file   Social History  Narrative   Divorced   Does not get regular exercise    Review of Systems: See HPI, otherwise negative ROS  Physical Exam: BP 122/61   Pulse 73   Temp 98.3 F (36.8 C) (Tympanic)   Resp 20   Ht 5\' 6"  (1.676 m)   Wt 122 lb (55.3 kg)   SpO2 100%   BMI 19.69 kg/m  General:   Alert,  pleasant and cooperative in NAD Head:  Normocephalic and atraumatic. Neck:  Supple; no masses or thyromegaly. Lungs:  Clear throughout to auscultation.    Heart:  Regular rate and rhythm. Abdomen:  Soft, nontender and nondistended. Normal bowel sounds, without guarding, and without rebound.   Neurologic:  Alert and  oriented x4;  grossly normal neurologically.  Impression/Plan: Matthew Brown is here for an endoscopy to be performed for gi bleed   Risks, benefits, limitations, and alternatives regarding  endoscopy have been reviewed with the patient.  Questions have been answered.  All parties agreeable.   Matthew Bellows, MD  07/09/2016, 9:21 AM

## 2016-07-09 NOTE — Transfer of Care (Signed)
Immediate Anesthesia Transfer of Care Note  Patient: Matthew Brown  Procedure(s) Performed: Procedure(s): ESOPHAGOGASTRODUODENOSCOPY (EGD) WITH PROPOFOL (N/A)  Patient Location: PACU  Anesthesia Type:General  Level of Consciousness: sedated  Airway & Oxygen Therapy: Patient Spontanous Breathing and Patient connected to nasal cannula oxygen  Post-op Assessment: Report given to RN and Post -op Vital signs reviewed and stable  Post vital signs: Reviewed and stable  Last Vitals:  Vitals:   07/09/16 0856 07/09/16 0955  BP: 122/61 (!) 113/52  Pulse: 73 77  Resp: 20 20  Temp: 36.8 C 36.4 C    Last Pain:  Vitals:   07/09/16 0955  TempSrc: Tympanic  PainSc:          Complications: No apparent anesthesia complications

## 2016-07-10 ENCOUNTER — Encounter: Payer: Self-pay | Admitting: Hematology and Oncology

## 2016-07-10 ENCOUNTER — Ambulatory Visit: Admission: RE | Admit: 2016-07-10 | Payer: Medicare HMO | Source: Ambulatory Visit

## 2016-07-10 ENCOUNTER — Ambulatory Visit: Admission: RE | Admit: 2016-07-10 | Payer: Commercial Managed Care - HMO | Source: Ambulatory Visit

## 2016-07-10 ENCOUNTER — Other Ambulatory Visit: Payer: Commercial Managed Care - HMO

## 2016-07-10 DIAGNOSIS — Z515 Encounter for palliative care: Secondary | ICD-10-CM

## 2016-07-10 DIAGNOSIS — Z7189 Other specified counseling: Secondary | ICD-10-CM

## 2016-07-10 DIAGNOSIS — C801 Malignant (primary) neoplasm, unspecified: Secondary | ICD-10-CM

## 2016-07-10 DIAGNOSIS — K922 Gastrointestinal hemorrhage, unspecified: Secondary | ICD-10-CM

## 2016-07-10 LAB — PREPARE RBC (CROSSMATCH)

## 2016-07-10 LAB — HEMOGLOBIN AND HEMATOCRIT, BLOOD
HCT: 22.9 % — ABNORMAL LOW (ref 40.0–52.0)
HEMOGLOBIN: 7.8 g/dL — AB (ref 13.0–18.0)

## 2016-07-10 LAB — SURGICAL PATHOLOGY

## 2016-07-10 LAB — HEMOGLOBIN: HEMOGLOBIN: 7.1 g/dL — AB (ref 13.0–18.0)

## 2016-07-10 MED ORDER — SODIUM CHLORIDE 0.9 % IV SOLN
Freq: Once | INTRAVENOUS | Status: AC
  Administered 2016-07-10: 16:00:00 via INTRAVENOUS

## 2016-07-10 NOTE — Progress Notes (Signed)
Patient receiving 1 unit of blood.  Significant other has been with the patient all day.  She is supportive.

## 2016-07-10 NOTE — Consult Note (Signed)
Consultation Note Date: 07/10/16  Patient Name: Matthew Brown  DOB: 04-25-1941  MRN: [id number]  Age / Sex: 76 y.o., male  PCP: Arnetha Courser, MD Referring Physician: Hillary Bow, MD  Reason for Consultation: Establishing goals of care  HPI/Patient Profile: 76 y.o. male  with past medical history of ESRD s/p right kidney transplant, lymphoma on chemotherapy, pulmonary hypertension, atrial fibrillation, hyperlipidemia, gout, GI bleed, GERD, essential hypertension, and BPH admitted on 07/08/2016 from GI clinic with Hgb of 6.8 and heme positive stool. Recent hospitalization due to viral gastroenteritis. Patient with epigastric pain and poor appetite prior to admission. In ED, Hgb of 7.6 and receiving 2 units PRBC. EGD on 1/17 revealed multiple nonbleeding duodenal ulcers. GI recommends continuing PPI and soft diet. Patient receiving 1 unit PRBC during consultation. Patient seen by Dr. Mike Gip for stage IV diffuse large B cell lymphoma. Has received 3 cycles of mini R CHOP chemotherapy and prophylactic intrathecal methotrexate. Per Dr. Rogue Bussing, chemotherapy on hold this week due to hospitalization for melena/GIB. Palliative medicine consultation for goals of care.   Clinical Assessment and Goals of Care: I have reviewed medical records and met with patient and significant other (Matthew Brown) at bedside. Introduced Palliative Medicine as specialized medical care for people living with serious illness. It focuses on providing relief from the symptoms and stress of a serious illness. The goal is to improve quality of life for both the patient and the family.  We discussed a brief life review of the patient, who requests I speak with Matthew Brown while he rests. They are not married but have been together for over 30 years. They both have children from previous marriages. His kidney transplant was in 2006. He was recently  diagnosed with cancer last fall and they have been seeing Dr. Mike Gip outpatient.    I attempted to elicit values and goals of care important to the patient. Discussed advanced directives, concepts specific to code status, and artifical feeding and hydration. Matthew Brown tells me they have had conversations regarding code status and he has expressed wishes of wanting to be resuscitated, on life support, and with a feeding tube if it would extend his life. Also that he will live in a nursing home if she can no longer care for him at home. I gave her a Hard Choices copy to read regarding these big decisions they will be faced with in the future, especially as cancer progresses. Educated on my recommendation for DNR due to cancer and other co-morbidities.   They are only on 4th cycle of chemotherapy and remain hopeful that "he can fight this battle." They are spiritual individuals with strong faith and "pray every day."   Questions and concerns were addressed. PMT will continue to support holistically.   SUMMARY OF RECOMMENDATIONS    Discussed and educated on code status. Remains FULL code. The patient would want everything necessary to stay alive. Hard choices copy given and I encouraged them to continue these conversations regarding his wishes as disease progresses.  Full scope treatment.  Could benefit from palliative services to follow outpatient.  PMT will continue to shadow chart and f/u with patient and family as needed.   Code Status/Advance Care Planning:  Full code   Symptom Management:   Per attending  Palliative Prophylaxis:   Aspiration, Delirium Protocol, Frequent Pain Assessment, Oral Care and Turn Reposition  Additional Recommendations (Limitations, Scope, Preferences):  Full Scope Treatment  Psycho-social/Spiritual:   Desire for further Chaplaincy support:yes  Additional Recommendations: Caregiving  Support/Resources  Prognosis:   Unable to  determine  Discharge Planning: To Be Determined most likely home     Primary Diagnoses: Present on Admission: . GIB (gastrointestinal bleeding)   I have reviewed the medical record, interviewed the patient and family, and examined the patient. The following aspects are pertinent.  Past Medical History:  Diagnosis Date  . Benign prostatic hypertrophy   . Chronic headache 10/19/2015  . ED (erectile dysfunction)   . End stage renal disease ([place])   . Essential hypertension   . GERD (gastroesophageal reflux disease)   . GIB (gastrointestinal bleeding)    a. 0/6269 s/p R colic artery embolization;  b. 02/2015 EGD: duod ulcerative mass->Bx notable for coagulative necrosis - ? ischemia vs thrombosis-->coumadin d/c'd.  . Gout   . Hearing loss   . Hemorrhoids   . Hyperlipidemia   . Lymphoma ([place])   . Lymphoma ([place]) 2017  . Multiple thyroid nodules 06/06/2016   Noted on carotid US; dedicated US to be ordered by staff  . Osteoarthrosis, unspecified whether generalized or localized, lower leg   . Persistent atrial fibrillation ([place])    a. CHA2DS2VASc = 3-->coumadin d/c'd 02/2015 2/2 recurrent GIB.  [name] Prostatitis   . Pulmonary hypertension    a. 10/2014 Echo: EF 60-65%, mild to mod MR, mildly dil LA, nl RV, PASP 56mHg.  .Marland KitchenRenal transplant recipient   . Ulcers of both great toes ([facility]    Social History   Social History  . Marital status: Divorced    Spouse name: N/A  . Number of children: N/A  . Years of education: N/A   Occupational History  . Retired Retired   Social History Main Topics  . Smoking status: Former Smoker    Packs/day: 1.00    Years: 25.00    Types: Cigarettes    Quit date: 06/23/1978  . Smokeless tobacco: Never Used  . Alcohol use No  . Drug use: No  . Sexual activity: Yes   Other Topics Concern  . None   Social History Narrative   Divorced   Does not get regular exercise   Family History  Problem Relation Age of Onset  . Cancer Mother     throat   . Diabetes Brother   . Heart disease Brother   . Stroke Brother   . Hypertension Brother   . Diabetes Sister   . Heart disease Sister   . Hypertension Sister   . Diabetes Sister   . Diabetes Brother   . COPD Neg Hx   . Kidney disease Neg Hx   . Prostate cancer Neg Hx    Scheduled Meds: . allopurinol  100 mg Oral Daily  . feeding supplement (ENSURE ENLIVE)  237 mL Oral TID BM  . finasteride  5 mg Oral Daily  . iron sucrose  200 mg Intravenous Q24H  . oxybutynin  5 mg Oral Daily  . pantoprazole  40 mg Intravenous Q12H  . predniSONE  5 mg Oral Daily  . sucralfate  1 g Oral  TID WC & HS  . tacrolimus  3 mg Oral BID  . tamsulosin  0.4 mg Oral Daily   Continuous Infusions: PRN Meds:.acetaminophen **OR** acetaminophen, ondansetron **OR** ondansetron (ZOFRAN) IV, oxyCODONE, senna-docusate Medications Prior to Admission:  Prior to Admission medications   Medication Sig Start Date End Date Taking? Authorizing Provider  acetaminophen (TYLENOL) 325 MG tablet Take 2 tablets (650 mg total) by mouth every 6 (six) hours as needed for mild pain (or Fever >/= 101). 05/13/16  Yes Nicholes Mango, MD  albuterol (PROAIR HFA) 108 (90 BASE) MCG/ACT inhaler Inhale 1-2 puffs into the lungs every 4 (four) hours as needed.  02/20/14  Yes Historical Provider, MD  allopurinol (ZYLOPRIM) 100 MG tablet Take 100 mg by mouth daily.     Yes Historical Provider, MD  COLCRYS 0.6 MG tablet Take 1 tablet by mouth 2 (two) times daily as needed. 05/03/16  Yes Historical Provider, MD  diphenhydrAMINE (BENADRYL) 25 mg capsule Take 1 capsule (25 mg total) by mouth at bedtime as needed for sleep. 05/13/16  Yes Nicholes Mango, MD  finasteride (PROSCAR) 5 MG tablet Take 5 mg by mouth daily.     Yes Historical Provider, MD  furosemide (LASIX) 80 MG tablet Take 1 tablet by mouth daily as needed. 05/06/16  Yes Historical Provider, MD  hydroxypropyl methylcellulose (ISOPTO TEARS) 2.5 % ophthalmic solution Place 1 drop into both eyes as  needed.    Yes Historical Provider, MD  Multiple Vitamin (MULTIVITAMIN) tablet Take 1 tablet by mouth daily.     Yes Historical Provider, MD  ondansetron (ZOFRAN) 4 MG tablet Take 1 tablet (4 mg total) by mouth every 6 (six) hours as needed for nausea. 05/22/16  Yes Vaughan Basta, MD  oxybutynin (DITROPAN-XL) 5 MG 24 hr tablet Take 5 mg by mouth daily.   Yes Historical Provider, MD  pantoprazole (PROTONIX) 40 MG tablet Take 1 tablet (40 mg total) by mouth 2 (two) times daily. 06/28/16  Yes Richard Leslye Peer, MD  predniSONE (DELTASONE) 20 MG tablet Take 70 mg (3.5 pills) daily for 5 days after each cycle of chemotherapy. 05/30/16  Yes Lequita Asal, MD  predniSONE (DELTASONE) 5 MG tablet Take 5 mg by mouth daily.  11/23/13  Yes Historical Provider, MD  prochlorperazine (COMPAZINE) 10 MG tablet Take 1 tablet by mouth daily as needed. 05/17/16  Yes Historical Provider, MD  simethicone (MYLICON) 80 MG chewable tablet Chew 1 tablet (80 mg total) by mouth every 6 (six) hours as needed for flatulence. 06/28/16  Yes Richard Leslye Peer, MD  sucralfate (CARAFATE) 1 g tablet Take 1 tablet (1 g total) by mouth 4 (four) times daily. Resume taking after one week- once finished taking oral levaquine. ( to avoid interaction.) 05/22/16  Yes Vaughan Basta, MD  tacrolimus (PROGRAF) 1 MG capsule Take 3 mg by mouth 2 (two) times daily. Reported on 08/16/2015   Yes Historical Provider, MD  Tamsulosin HCl (FLOMAX) 0.4 MG CAPS Take 0.4 mg by mouth daily.     Yes Historical Provider, MD  feeding supplement, ENSURE ENLIVE, (ENSURE ENLIVE) LIQD Take 237 mLs by mouth 3 (three) times daily between meals. 05/13/16   Nicholes Mango, MD  HYDROcodone-acetaminophen (NORCO) 5-325 MG tablet Take 1 tablet by mouth every 4 (four) hours as needed for moderate pain. Patient not taking: Reported on 06/25/2016 05/02/16   Lequita Asal, MD  lactulose (CHRONULAC) 10 GM/15ML solution Take 7.5-15 mLs (5-10 g total) by mouth daily as  needed for mild constipation. 11/14/15  Arnetha Courser, MD  oxyCODONE-acetaminophen (ROXICET) 5-325 MG tablet Take 0.5-1 tablets by mouth every 6 (six) hours as needed for severe pain. Patient not taking: Reported on 07/08/2016 05/26/16   Lequita Asal, MD   No Known Allergies Review of Systems  Constitutional: Positive for activity change, appetite change and fatigue.  Gastrointestinal: Positive for abdominal pain, blood in stool and nausea.   Physical Exam  Constitutional: He is oriented to person, place, and time. He is easily aroused.  Sleeping during visit and requests I speak with his significant other at bedside.   HENT:  Head: Normocephalic and atraumatic.  Pulmonary/Chest: Effort normal.  Abdominal: Normal appearance.  Neurological: He is alert, oriented to person, place, and time and easily aroused.  Skin: Skin is warm and dry.  Psychiatric: He has a normal mood and affect. His speech is normal and behavior is normal.  Nursing note and vitals reviewed.  Vital Signs: BP (!) 109/52 (BP Location: Left Arm)   Pulse 60   Temp 98.3 F (36.8 C) (Oral)   Resp 18   Ht 5' 6"  (1.676 m)   Wt 55.3 kg (122 lb)   SpO2 96%   BMI 19.69 kg/m  Pain Assessment: No/denies pain   Pain Score: 2   SpO2: SpO2: 96 % O2 Device:SpO2: 96 % O2 Flow Rate: .O2 Flow Rate (L/min): 2 L/min  IO: Intake/output summary:   Intake/Output Summary (Last 24 hours) at 07/11/16 0900 Last data filed at 07/10/16 1900  Gross per 24 hour  Intake             1122 ml  Output                0 ml  Net             1122 ml    LBM: Last BM Date: 07/10/16 Baseline Weight: Weight: 55.3 kg (122 lb) Most recent weight: Weight: 55.3 kg (122 lb)     Palliative Assessment/Data:  PPS 50%   Flowsheet Rows   Flowsheet Row Most Recent Value  Intake Tab  Referral Department  Hospitalist  Unit at Time of Referral  Oncology Unit  Palliative Care Primary Diagnosis  Cancer  Date Notified  07/08/16  Palliative  Care Type  New Palliative care  Reason for referral  Clarify Goals of Care  Date of Admission  07/08/16  Date first seen by Palliative Care  07/10/16  # of days IP prior to Palliative referral  0  Clinical Assessment  Palliative Performance Scale Score  50%  Psychosocial & Spiritual Assessment  Palliative Care Outcomes  Patient/Family meeting held?  Yes  Who was at the meeting?  patient and significant other (Ms. Laurance Flatten)  Palliative Care Outcomes  Clarified goals of care, Provided psychosocial or spiritual support, ACP counseling assistance      Time In: 1400 Time Out: 1510 Time Total: 22mn Greater than 50%  of this time was spent counseling and coordinating care related to the above assessment and plan.  Signed by:  MIhor Dow FNP-C Palliative Medicine Team  Phone: [phone]: [phone] Please contact Palliative Medicine Team phone at [phone] questions and concerns.  For individual provider: See AShea Evans

## 2016-07-10 NOTE — Care Management Important Message (Signed)
Important Message  Patient Details  Name: ARUN BAGGARLY MRN: [id number] Date of Birth: December 14, 1940   Medicare Important Message Given:  Yes    Shelbie Ammons, RN 07/10/2016, 12:16 PM

## 2016-07-10 NOTE — Progress Notes (Signed)
[facility] [place] NAME: Lathen Cucco    MR#:  [id number]  DATE OF BIRTH:  1940/07/20  SUBJECTIVE:  CHIEF COMPLAINT:   Chief Complaint  Patient presents with  . Abnormal Lab  . GI Bleeding   Mild epigastric pain. EGD done today showed multiple duodenal ulcers. 1 episode dark stool. REVIEW OF SYSTEMS:    Review of Systems  Constitutional: Positive for malaise/fatigue. Negative for chills and fever.  HENT: Negative for sore throat.   Eyes: Negative for blurred vision, double vision and pain.  Respiratory: Negative for cough, hemoptysis, shortness of breath and wheezing.   Cardiovascular: Negative for chest pain, palpitations, orthopnea and leg swelling.  Gastrointestinal: Positive for abdominal pain. Negative for constipation, diarrhea, heartburn, nausea and vomiting.  Genitourinary: Negative for dysuria and hematuria.  Musculoskeletal: Negative for back pain and joint pain.  Skin: Negative for rash.  Neurological: Positive for weakness. Negative for sensory change, speech change, focal weakness and headaches.  Endo/Heme/Allergies: Does not bruise/bleed easily.  Psychiatric/Behavioral: Negative for depression. The patient is not nervous/anxious.     DRUG ALLERGIES:  No Known Allergies  VITALS:  Blood pressure (!) 115/48, pulse (!) 58, temperature 97.6 F (36.4 C), temperature source Oral, resp. rate 17, height 5\' 6"  (1.676 m), weight 55.3 kg (122 lb), SpO2 95 %.  PHYSICAL EXAMINATION:   Physical Exam  GENERAL:  76 y.o.-year-old patient lying in the bed with no acute distress.  EYES: Pupils equal, round, reactive to light and accommodation. No scleral icterus. Extraocular muscles intact.  HEENT: Head atraumatic, normocephalic. Oropharynx and nasopharynx clear.  NECK:  Supple, no jugular venous distention. No thyroid enlargement, no tenderness.  LUNGS: Normal breath sounds bilaterally, no wheezing, rales, rhonchi. No use of  accessory muscles of respiration.  CARDIOVASCULAR: S1, S2 normal. No murmurs, rubs, or gallops.  ABDOMEN: Soft, nontender, nondistended. Bowel sounds present. No organomegaly or mass.  EXTREMITIES: No cyanosis, clubbing or edema b/l.    NEUROLOGIC: Cranial nerves II through XII are intact. No focal Motor or sensory deficits b/l.   PSYCHIATRIC: The patient is alert and oriented x 3.  SKIN: No obvious rash, lesion, or ulcer.   LABORATORY PANEL:   CBC  Recent Labs Lab 07/09/16 0720  07/10/16 2000  WBC 9.4  --   --   HGB 7.8*  < > 7.8*  HCT 22.3*  < > 22.9*  PLT 132*  --   --   < > = values in this interval not displayed. ------------------------------------------------------------------------------------------------------------------ Chemistries   Recent Labs Lab 07/08/16 1255 07/09/16 0720 07/09/16 2252  NA 137 139  --   K 4.3 4.2 3.9  CL 105 109  --   CO2 27 28  --   GLUCOSE 154* 94  --   BUN 37* 26*  --   CREATININE 1.21 0.95  --   CALCIUM 10.1 9.0  --   MG  --   --  1.7  AST 24  --   --   ALT 10*  --   --   ALKPHOS 73  --   --   BILITOT 0.6  --   --    ------------------------------------------------------------------------------------------------------------------  Cardiac Enzymes No results for input(s): TROPONINI in the last 168 hours. ------------------------------------------------------------------------------------------------------------------  RADIOLOGY:  No results found.   ASSESSMENT AND PLAN:   76 year old male with lymphoma currently on chemotherapy who had a follow-up echo in our clinic GI for anemia by the request of Dr. Mike Gip  and has been having dark-colored stools which are guaiac positive.  1. Acute on chronic anemia: Transfused 1 unit PRBC Due to GI bleed EGD showed nonbleeding duodenal ulcers. Continue IV PPI. Soft diet. Discussed with Dr. Vicente Males. Repeat hemoglobin in the morning. Worsening anemia. Transfuse 1 more unit PRBC  today  4. History of lymphoma  5. Persistent atrial fibrillation Coumadin has been discontinued due to GI bleed  6. History of renal transplant: Continue Prograf and prednisone  7. BPH on tamsulosin and finasteride  8. Protein calorie malnutrition: Continue feeding supplements.   All the records are reviewed and case discussed with Care Management/Social Workerr. Management plans discussed with the patient, family and they are in agreement.  CODE STATUS: FULL CODE  DVT Prophylaxis: SCDs  TOTAL TIME TAKING CARE OF THIS PATIENT: 40 minutes.   POSSIBLE D/C IN 1-2 DAYS, DEPENDING ON CLINICAL CONDITION.  Hillary Bow R M.D on 07/10/2016 at 8:42 PM  Between 7am to 6pm - Pager - [phone]  After 6pm go to [email or web address] - password [email or web address] [facility]  Office  [phone]  CC: Primary care physician; Enid Derry, MD  Note: This dictation was prepared with Dragon dictation along with smaller phrase technology. Any transcriptional errors that result from this process are unintentional.

## 2016-07-10 NOTE — Progress Notes (Signed)
Palliative Medicine consult noted. Due to high referral volume, there may be a delay seeing this patient. Please call the Palliative Medicine Team office at [phone] if recommendations are needed in the interim.  Thank you for inviting [place] to see this patient.  Marjie Skiff Peniel Hass, RN, BSN, [facility] 07/10/2016 8:32 AM Cell [phone] 8:00-4:00 Monday-Friday Office [phone]

## 2016-07-11 ENCOUNTER — Inpatient Hospital Stay: Payer: Medicare HMO

## 2016-07-11 ENCOUNTER — Encounter: Payer: Self-pay | Admitting: Gastroenterology

## 2016-07-11 ENCOUNTER — Inpatient Hospital Stay: Payer: Medicare HMO | Admitting: Hematology and Oncology

## 2016-07-11 ENCOUNTER — Other Ambulatory Visit: Payer: Self-pay | Admitting: Hematology and Oncology

## 2016-07-11 ENCOUNTER — Other Ambulatory Visit: Payer: Commercial Managed Care - HMO

## 2016-07-11 DIAGNOSIS — C801 Malignant (primary) neoplasm, unspecified: Secondary | ICD-10-CM

## 2016-07-11 DIAGNOSIS — K922 Gastrointestinal hemorrhage, unspecified: Secondary | ICD-10-CM

## 2016-07-11 DIAGNOSIS — Z515 Encounter for palliative care: Secondary | ICD-10-CM

## 2016-07-11 DIAGNOSIS — Z7189 Other specified counseling: Secondary | ICD-10-CM

## 2016-07-11 LAB — PLATELET COUNT: PLATELETS: 117 10*3/uL — AB (ref 150–440)

## 2016-07-11 LAB — PREPARE RBC (CROSSMATCH)

## 2016-07-11 LAB — HEMOGLOBIN: Hemoglobin: 7.1 g/dL — ABNORMAL LOW (ref 13.0–18.0)

## 2016-07-11 LAB — HAPTOGLOBIN: HAPTOGLOBIN: 29 mg/dL — AB (ref 34–200)

## 2016-07-11 MED ORDER — SODIUM CHLORIDE 0.9 % IV SOLN
INTRAVENOUS | Status: DC
  Administered 2016-07-11: 17:00:00 20 mL/h via INTRAVENOUS

## 2016-07-11 MED ORDER — PEG 3350-KCL-NA BICARB-NACL 420 G PO SOLR
4000.0000 mL | Freq: Once | ORAL | Status: DC
  Filled 2016-07-11: qty 4000

## 2016-07-11 MED ORDER — SODIUM CHLORIDE 0.9 % IV SOLN
Freq: Once | INTRAVENOUS | Status: AC
  Administered 2016-07-11: 17:00:00 via INTRAVENOUS

## 2016-07-11 MED ORDER — TECHNETIUM TC 99M-LABELED RED BLOOD CELLS IV KIT
19.8000 | PACK | Freq: Once | INTRAVENOUS | Status: AC | PRN
  Administered 2016-07-11: 15:00:00 19.8 via INTRAVENOUS

## 2016-07-11 MED ORDER — FUROSEMIDE 40 MG PO TABS
40.0000 mg | ORAL_TABLET | Freq: Once | ORAL | Status: AC
  Administered 2016-07-11: 09:00:00 40 mg via ORAL
  Filled 2016-07-11: qty 1

## 2016-07-11 NOTE — Progress Notes (Signed)
  [name] MD [address] [place], [place] [address] Phone: [phone] Fax : [phone]  Matthew Brown is being followed for GI bleed   Subjective: 1-2 episodes of dark stool with some blood in it    Objective: Vital signs in last 24 hours: Vitals:   07/10/16 1351 07/10/16 1627 07/10/16 2001 07/11/16 0426  BP: (!) 118/41 (!) 117/43 (!) 115/48 (!) 109/52  Pulse: 74 (!) 59 (!) 58 60  Resp: 18 18 17 18   Temp: 97.7 F (36.5 C) 98.1 F (36.7 C) 97.6 F (36.4 C) 98.3 F (36.8 C)  TempSrc: Oral Oral Oral Oral  SpO2: 100% 99% 95% 96%  Weight:      Height:       Weight change:   Intake/Output Summary (Last 24 hours) at 07/11/16 1343 Last data filed at 07/11/16 1200  Gross per 24 hour  Intake             1362 ml  Output              675 ml  Net              687 ml     Exam: Heart:: Regular rate and rhythm, S1S2 present or without murmur or extra heart sounds Lungs: normal and clear to auscultation Abdomen: soft, nontender, normal bowel sounds   Lab Results: @LABTEST2 @ Micro Results: No results found for this or any previous visit (from the past 240 hour(s)). Studies/Results: No results found. Medications: I have reviewed the patient's current medications. Scheduled Meds: . sodium chloride   Intravenous Once  . allopurinol  100 mg Oral Daily  . feeding supplement (ENSURE ENLIVE)  237 mL Oral TID BM  . finasteride  5 mg Oral Daily  . iron sucrose  200 mg Intravenous Q24H  . oxybutynin  5 mg Oral Daily  . pantoprazole  40 mg Intravenous Q12H  . predniSONE  5 mg Oral Daily  . sucralfate  1 g Oral TID WC & HS  . tacrolimus  3 mg Oral BID  . tamsulosin  0.4 mg Oral Daily   Continuous Infusions: PRN Meds:.acetaminophen **OR** acetaminophen, ondansetron **OR** ondansetron (ZOFRAN) IV, oxyCODONE, senna-docusate   Assessment: Active Problems:   GIB (gastrointestinal bleeding)   Acute upper GI bleed   Cancer [facility])   Palliative care by specialist  DNR (do not resuscitate) discussion   Goals of care, counseling/discussion  Matthew Brown is a 76 y.o. y/o male presents with severe anemia , EGD on 07/09/16 showed multiple non bleeding superficial ulcers. Continues to drop Hb.   Plan: 1. Tagged RBC scan  2. Monitor CBCD 3. Colonoscopy tomorrow.    LOS: 3 days   [name] 07/11/2016, 1:43 PM

## 2016-07-11 NOTE — H&P (View-Only) (Signed)
+   NM study. Results called to Dr Vicente Males and Dr Darvin Neighbours. Order for CTA with IVR tonight to stop bleeding.

## 2016-07-11 NOTE — Interval H&P Note (Signed)
Colonoscopy cancelled for tomorrow since tagged scan positive

## 2016-07-11 NOTE — Consult Note (Signed)
Surgical Consultation  07/11/2016  Matthew Brown is an 76 y.o. male.   CC:GI bleed  HPI: This patient with a history of lymphoma who is due for chemotherapy this week. He has not had any chemotherapy 3 weeks. Last scheduled was for today. When he was seen in the [facility] he was found to be anemic and transfused 2 units of blood on Monday. He was then admitted the hospital on Wednesday again anemic with possible GI bleed. Workup here in the hospital has suggested gastric ulcers but also a tagged red cell scan showed bleeding in at least one and possibly multiple loops of small bowel. Was seen on the right side but not well localized.  Dr. Azalee Course attempted to contact radiology to discuss the Integra cell scan personally and was unable. There is also discussion held concerning possible embolization. Prime doc is also discussed transfer as the patient has had surgeries in the past at [facility].  Of note the patient is a renal transplant patient on prednisone and antirejection meds. He had a history of a GI bleed of some sort in 2015 requiring surgery at [facility].  History was obtained from the patient patient's wife and the chart as well as discussion with Dr. Azalee Course who had had multiple discussions with other providers during the evening prior to sign out.  Past Medical History:  Diagnosis Date  . Benign prostatic hypertrophy   . Chronic headache 10/19/2015  . ED (erectile dysfunction)   . End stage renal disease ([place])   . Essential hypertension   . GERD (gastroesophageal reflux disease)   . GIB (gastrointestinal bleeding)    a. [id number] s/p R colic artery embolization;  b. 02/2015 EGD: duod ulcerative mass->Bx notable for coagulative necrosis - ? ischemia vs thrombosis-->coumadin d/c'd.  . Gout   . Hearing loss   . Hemorrhoids   . Hyperlipidemia   . Lymphoma ([place])   . Lymphoma ([place]) 2017  . Multiple thyroid nodules 06/06/2016   Noted on carotid US; dedicated US to be ordered by  staff  . Osteoarthrosis, unspecified whether generalized or localized, lower leg   . Persistent atrial fibrillation ([place])    a. CHA2DS2VASc = 3-->coumadin d/c'd 02/2015 2/2 recurrent GIB.  [name] Prostatitis   . Pulmonary hypertension    a. 10/2014 Echo: EF 60-65%, mild to mod MR, mildly dil LA, nl RV, PASP 24mmHg.  [name] Renal transplant recipient   . Ulcers of both great toes [facility])     Past Surgical History:  Procedure Laterality Date  . BACK SURGERY    . ESOPHAGOGASTRODUODENOSCOPY  03/13/15   severe esophagitis, ulcerated mass  . ESOPHAGOGASTRODUODENOSCOPY (EGD) WITH PROPOFOL N/A 07/09/2016   Procedure: ESOPHAGOGASTRODUODENOSCOPY (EGD) WITH PROPOFOL;  Surgeon: [name], MD;  Location: [facility] ENDOSCOPY;  Service: Endoscopy;  Laterality: N/A;  . HERNIA REPAIR  1974  . PERIPHERAL VASCULAR CATHETERIZATION N/A 05/07/2016   Procedure: Glori Luis Cath Insertion;  Surgeon: Algernon Huxley, MD;  Location: [facility] LAB;  Service: Cardiovascular;  Laterality: N/A;  . PROSTATE ABLATION    . STOMACH SURGERY     blood vessel burst  . THROAT SURGERY    . TOTAL KNEE ARTHROPLASTY      Family History  Problem Relation Age of Onset  . Cancer Mother     throat  . Diabetes Brother   . Heart disease Brother   . Stroke Brother   . Hypertension Brother   . Diabetes Sister   . Heart disease Sister   .  Hypertension Sister   . Diabetes Sister   . Diabetes Brother   . COPD Neg Hx   . Kidney disease Neg Hx   . Prostate cancer Neg Hx     Social History:  reports that he quit smoking about 38 years ago. His smoking use included Cigarettes. He has a 25.00 pack-year smoking history. He has never used smokeless tobacco. He reports that he does not drink alcohol or use drugs.  Allergies: No Known Allergies  Medications reviewed.   Review of Systems:   Review of Systems  Constitutional: Negative for chills and fever.  HENT: Negative.   Eyes: Negative.   Respiratory: Negative.   Cardiovascular: Negative.    Gastrointestinal: Positive for blood in stool and melena. Negative for abdominal pain, constipation, diarrhea, heartburn, nausea and vomiting.  Genitourinary: Negative.   Musculoskeletal: Negative.   Skin: Negative.   Neurological: Positive for weakness.  Endo/Heme/Allergies: Negative.   Psychiatric/Behavioral: Negative.      Physical Exam:  BP (!) 109/55   Pulse 71   Temp 97.9 F (36.6 C) (Oral)   Resp 16   Ht 5\' 6"  (1.676 m)   Wt 122 lb (55.3 kg)   SpO2 97%   BMI 19.69 kg/m   Physical Exam  Constitutional: He is oriented to person, place, and time and well-developed, well-nourished, and in no distress. No distress.  Somewhat thin patient  Vital signs are reviewed and heart rate is 58 with a blood pressure systolic 0000000  No acute distress  HENT:  Head: Normocephalic and atraumatic.  Eyes: Right eye exhibits no discharge. Left eye exhibits no discharge. No scleral icterus.  Neck: Normal range of motion.  Cardiovascular: Normal rate, regular rhythm and normal heart sounds.   Pulmonary/Chest: Effort normal. No respiratory distress.  Abdominal: Soft. He exhibits distension. There is no tenderness. There is no rebound and no guarding.  Scars noted  Musculoskeletal: Normal range of motion. He exhibits no edema or tenderness.  Lymphadenopathy:    He has no cervical adenopathy.  Neurological: He is alert and oriented to person, place, and time.  Skin: Skin is warm and dry. He is not diaphoretic.  Psychiatric: Mood and affect normal.  Vitals reviewed.     Results for orders placed or performed during the hospital encounter of 07/08/16 (from the past 48 hour(s))  Magnesium     Status: None   Collection Time: 07/09/16 10:52 PM  Result Value Ref Range   Magnesium 1.7 1.7 - 2.4 mg/dL  Potassium     Status: None   Collection Time: 07/09/16 10:52 PM  Result Value Ref Range   Potassium 3.9 3.5 - 5.1 mmol/L  Hemoglobin     Status: Abnormal   Collection Time: 07/10/16   5:00 AM  Result Value Ref Range   Hemoglobin 7.1 (L) 13.0 - 18.0 g/dL  Prepare RBC     Status: None   Collection Time: 07/10/16 10:00 AM  Result Value Ref Range   Order Confirmation ORDER PROCESSED BY BLOOD BANK   Hemoglobin and hematocrit, blood     Status: Abnormal   Collection Time: 07/10/16  8:00 PM  Result Value Ref Range   Hemoglobin 7.8 (L) 13.0 - 18.0 g/dL   HCT 22.9 (L) 40.0 - 52.0 %  Hemoglobin     Status: Abnormal   Collection Time: 07/11/16  6:55 AM  Result Value Ref Range   Hemoglobin 7.1 (L) 13.0 - 18.0 g/dL  Prepare RBC     Status: None  Collection Time: 07/11/16  9:52 AM  Result Value Ref Range   Order Confirmation ORDER PROCESSED BY BLOOD BANK    Nm Gi Blood Loss  Result Date: 07/11/2016 CLINICAL DATA:  GI bleeding EXAM: NUCLEAR MEDICINE GASTROINTESTINAL BLEEDING SCAN TECHNIQUE: Sequential abdominal images were obtained following intravenous administration of Tc-41m labeled red blood cells. RADIOPHARMACEUTICALS:  19.80 mCi Tc-58m in-vitro labeled autologous red cells. COMPARISON:  CT abdomen and pelvis 06/25/2016 FINDINGS: At 13 minutes into the exam, an abnormal focus of labeled red cell accumulation begins to identify in the RIGHT lateral abdomen. Over the course of the first hour, tracer passes through a series of curvilinear bowel loops in the RIGHT mid abdomen. The pattern of tracer movement fails to follow the expected course of the colon as demonstrated on CT. Findings are consistent with an active source of GI bleeding within small bowel in the RIGHT mid abdomen. IMPRESSION: Abnormal exam demonstrating presence of an active GI bleed in the lateral RIGHT mid abdomen coursing through multiple curvilinear bowel loops in the RIGHT mid abdomen favoring a small bowel source of bleeding. Electronically Signed   By: Lavonia Dana M.D.   On: 07/11/2016 16:38    Assessment/Plan:  Tagged red cell scan personally reviewed. Waiting for a call back from radiology. The multiple  sites that her  potential bleeding sites of the small bowel suggested this to be a very difficult operation should he bleed enough to require surgery. He has had transfusions but has not had any additional CBC in terms of platelet workup etc. if these are AVMs versus lymphoma that will need to be addressed as well. Unclear if colonoscopy is scheduled for tomorrow given the tagged red cell scan results. Laparotomy may be necessary in this patient should he continue to bleed but at this time he is not actively bleeding nor is he unstable.  Florene Glen, MD, FACS

## 2016-07-11 NOTE — Progress Notes (Signed)
[facility] [place] NAME: Matthew Brown    MR#:  [id number]  DATE OF BIRTH:  1940-12-23  SUBJECTIVE:  CHIEF COMPLAINT:   Chief Complaint  Patient presents with  . Abnormal Lab  . GI Bleeding   Had black stools with blood in it and I looked in the commode.  REVIEW OF SYSTEMS:    Review of Systems  Constitutional: Positive for malaise/fatigue. Negative for chills and fever.  HENT: Negative for sore throat.   Eyes: Negative for blurred vision, double vision and pain.  Respiratory: Negative for cough, hemoptysis, shortness of breath and wheezing.   Cardiovascular: Negative for chest pain, palpitations, orthopnea and leg swelling.  Gastrointestinal: Positive for abdominal pain. Negative for constipation, diarrhea, heartburn, nausea and vomiting.  Genitourinary: Negative for dysuria and hematuria.  Musculoskeletal: Negative for back pain and joint pain.  Skin: Negative for rash.  Neurological: Positive for weakness. Negative for sensory change, speech change, focal weakness and headaches.  Endo/Heme/Allergies: Does not bruise/bleed easily.  Psychiatric/Behavioral: Negative for depression. The patient is not nervous/anxious.     DRUG ALLERGIES:  No Known Allergies  VITALS:  Blood pressure (!) 119/37, pulse 75, temperature 97.7 F (36.5 C), temperature source Oral, resp. rate 18, height 5\' 6"  (1.676 m), weight 55.3 kg (122 lb), SpO2 98 %.  PHYSICAL EXAMINATION:   Physical Exam  GENERAL:  76 y.o.-year-old patient lying in the bed with no acute distress.  EYES: Pupils equal, round, reactive to light and accommodation. No scleral icterus. Extraocular muscles intact.  HEENT: Head atraumatic, normocephalic. Oropharynx and nasopharynx clear.  NECK:  Supple, no jugular venous distention. No thyroid enlargement, no tenderness.  LUNGS: Normal breath sounds bilaterally, no wheezing, rales, rhonchi. No use of accessory muscles of respiration.   CARDIOVASCULAR: S1, S2 normal. No murmurs, rubs, or gallops.  ABDOMEN: Soft, nontender, nondistended. Bowel sounds present. No organomegaly or mass.  EXTREMITIES: No cyanosis, clubbing or edema b/l.    NEUROLOGIC: Cranial nerves II through XII are intact. No focal Motor or sensory deficits b/l.   PSYCHIATRIC: The patient is alert and oriented x 3.  SKIN: No obvious rash, lesion, or ulcer.   LABORATORY PANEL:   CBC  Recent Labs Lab 07/09/16 0720  07/10/16 2000 07/11/16 0655  WBC 9.4  --   --   --   HGB 7.8*  < > 7.8* 7.1*  HCT 22.3*  < > 22.9*  --   PLT 132*  --   --   --   < > = values in this interval not displayed. ------------------------------------------------------------------------------------------------------------------ Chemistries   Recent Labs Lab 07/08/16 1255 07/09/16 0720 07/09/16 2252  NA 137 139  --   K 4.3 4.2 3.9  CL 105 109  --   CO2 27 28  --   GLUCOSE 154* 94  --   BUN 37* 26*  --   CREATININE 1.21 0.95  --   CALCIUM 10.1 9.0  --   MG  --   --  1.7  AST 24  --   --   ALT 10*  --   --   ALKPHOS 73  --   --   BILITOT 0.6  --   --    ------------------------------------------------------------------------------------------------------------------  Cardiac Enzymes No results for input(s): TROPONINI in the last 168 hours. ------------------------------------------------------------------------------------------------------------------  RADIOLOGY:  No results found.   ASSESSMENT AND PLAN:   76 year old male with lymphoma currently on chemotherapy who had a follow-up echo in  our clinic GI for anemia by the request of Dr. Mike Gip and has been having dark-colored stools which are guaiac positive.  1. Acute on chronic anemia:Worsening Patient had dark stool with blood in it today. Continues to bleed.  Transfused 2 unit PRBC On 07/09/2016 and 07/10/2016 Due to GI bleed EGD showed multiple nonbleeding duodenal ulcers. Continue  PPI. Discussed with Dr. Vicente Males of GI. Will get a tagged RBC scan. Colonoscopy to be scheduled. Transfuse 1 more unit of blood. History is critically ill.  4. History of lymphoma  5. Persistent atrial fibrillation Coumadin has been discontinued due to GI bleed  6. History of renal transplant: Continue Prograf and prednisone  7. BPH on tamsulosin and finasteride  8. Protein calorie malnutrition: Continue feeding supplements.   All the records are reviewed and case discussed with Care Management/Social Workerr. Management plans discussed with the patient, family and they are in agreement.  CODE STATUS: FULL CODE  DVT Prophylaxis: SCDs  TOTAL CC TIME TAKING CARE OF THIS PATIENT: 40 minutes.   POSSIBLE D/C IN 1-2 DAYS, DEPENDING ON CLINICAL CONDITION.  Hillary Bow R M.D on 07/11/2016 at 2:12 PM  Between 7am to 6pm - Pager - [phone]  After 6pm go to [email or web address] - password [email or web address] [facility]  Office  [phone]  CC: Primary care physician; Enid Derry, MD  Note: This dictation was prepared with Dragon dictation along with smaller phrase technology. Any transcriptional errors that result from this process are unintentional.

## 2016-07-11 NOTE — Progress Notes (Signed)
+   NM study. Results called to Dr Vicente Males and Dr Darvin Neighbours. Order for CTA with IVR tonight to stop bleeding.

## 2016-07-12 ENCOUNTER — Encounter
Admission: EM | Disposition: A | Discharge status: Other Acute Inpatient Hospital | Payer: Self-pay | Source: Home / Self Care | Attending: Internal Medicine

## 2016-07-12 DIAGNOSIS — Z7952 Long term (current) use of systemic steroids: Secondary | ICD-10-CM

## 2016-07-12 DIAGNOSIS — D696 Thrombocytopenia, unspecified: Secondary | ICD-10-CM

## 2016-07-12 DIAGNOSIS — R5383 Other fatigue: Secondary | ICD-10-CM

## 2016-07-12 DIAGNOSIS — T8610 Unspecified complication of kidney transplant: Secondary | ICD-10-CM

## 2016-07-12 DIAGNOSIS — R140 Abdominal distension (gaseous): Secondary | ICD-10-CM

## 2016-07-12 DIAGNOSIS — R634 Abnormal weight loss: Secondary | ICD-10-CM

## 2016-07-12 DIAGNOSIS — D509 Iron deficiency anemia, unspecified: Secondary | ICD-10-CM

## 2016-07-12 LAB — CBC WITH DIFFERENTIAL/PLATELET
Basophils Absolute: 0 10*3/uL (ref 0–0.1)
Basophils Relative: 0 %
Eosinophils Absolute: 0 10*3/uL (ref 0–0.7)
Eosinophils Relative: 0 %
HCT: 15 % — ABNORMAL LOW (ref 40.0–52.0)
Hemoglobin: 5 g/dL — ABNORMAL LOW (ref 13.0–18.0)
Lymphocytes Relative: 10 %
Lymphs Abs: 1.7 10*3/uL (ref 1.0–3.6)
MCH: 28.9 pg (ref 26.0–34.0)
MCHC: 33.1 g/dL (ref 32.0–36.0)
MCV: 87.1 fL (ref 80.0–100.0)
Monocytes Absolute: 1.2 10*3/uL — ABNORMAL HIGH (ref 0.2–1.0)
Monocytes Relative: 7 %
Neutro Abs: 14.5 10*3/uL — ABNORMAL HIGH (ref 1.4–6.5)
Neutrophils Relative %: 83 %
Platelets: 227 10*3/uL (ref 150–440)
RBC: 1.73 MIL/uL — ABNORMAL LOW (ref 4.40–5.90)
RDW: 22.1 % — ABNORMAL HIGH (ref 11.5–14.5)
WBC: 17.4 10*3/uL — ABNORMAL HIGH (ref 3.8–10.6)

## 2016-07-12 LAB — MRSA PCR SCREENING: MRSA by PCR: NEGATIVE

## 2016-07-12 LAB — TYPE AND SCREEN
ABO/RH(D): O POS
Antibody Screen: NEGATIVE
UNIT DIVISION: 0
UNIT DIVISION: 0
UNIT DIVISION: 0
Unit division: 0

## 2016-07-12 LAB — COMPREHENSIVE METABOLIC PANEL
ALK PHOS: 44 U/L (ref 38–126)
ALT: 8 U/L — AB (ref 17–63)
AST: 12 U/L — ABNORMAL LOW (ref 15–41)
Albumin: 2.2 g/dL — ABNORMAL LOW (ref 3.5–5.0)
Anion gap: 1 — ABNORMAL LOW (ref 5–15)
BUN: 32 mg/dL — ABNORMAL HIGH (ref 6–20)
CALCIUM: 8.8 mg/dL — AB (ref 8.9–10.3)
CO2: 27 mmol/L (ref 22–32)
CREATININE: 0.94 mg/dL (ref 0.61–1.24)
Chloride: 110 mmol/L (ref 101–111)
Glucose, Bld: 102 mg/dL — ABNORMAL HIGH (ref 65–99)
Potassium: 4.3 mmol/L (ref 3.5–5.1)
Sodium: 138 mmol/L (ref 135–145)
TOTAL PROTEIN: 3.9 g/dL — AB (ref 6.5–8.1)
Total Bilirubin: 0.5 mg/dL (ref 0.3–1.2)

## 2016-07-12 LAB — CBC
HCT: 20.9 % — ABNORMAL LOW (ref 40.0–52.0)
HEMOGLOBIN: 7 g/dL — AB (ref 13.0–18.0)
MCH: 28.6 pg (ref 26.0–34.0)
MCHC: 33.6 g/dL (ref 32.0–36.0)
MCV: 85.3 fL (ref 80.0–100.0)
Platelets: 112 10*3/uL — ABNORMAL LOW (ref 150–440)
RBC: 2.45 MIL/uL — AB (ref 4.40–5.90)
RDW: 21.8 % — ABNORMAL HIGH (ref 11.5–14.5)
WBC: 9.2 10*3/uL (ref 3.8–10.6)

## 2016-07-12 LAB — PLATELET FUNCTION ASSAY
COLLAGEN / ADP: 190 s — AB (ref 0–118)
COLLAGEN / EPINEPHRINE: 211 s — AB (ref 0–193)

## 2016-07-12 LAB — H. PYLORI ANTIGEN, STOOL: H. Pylori Stool Ag, Eia: NEGATIVE

## 2016-07-12 LAB — PROTIME-INR
INR: 1.23
Prothrombin Time: 15.6 seconds — ABNORMAL HIGH (ref 11.4–15.2)

## 2016-07-12 LAB — GLUCOSE, CAPILLARY
Glucose-Capillary: 102 mg/dL — ABNORMAL HIGH (ref 65–99)
Glucose-Capillary: 95 mg/dL (ref 65–99)

## 2016-07-12 LAB — FIBRINOGEN: FIBRINOGEN: 183 mg/dL — AB (ref 210–475)

## 2016-07-12 LAB — PREPARE RBC (CROSSMATCH)

## 2016-07-12 LAB — APTT: APTT: 33 s (ref 24–36)

## 2016-07-12 LAB — HEMOGLOBIN: HEMOGLOBIN: 7.5 g/dL — AB (ref 13.0–18.0)

## 2016-07-12 SURGERY — COLONOSCOPY WITH PROPOFOL
Anesthesia: General

## 2016-07-12 SURGERY — ESOPHAGOGASTRODUODENOSCOPY (EGD) WITH PROPOFOL
Anesthesia: General

## 2016-07-12 MED ORDER — CALCIUM CARBONATE ANTACID 500 MG PO CHEW
1000.0000 mg | CHEWABLE_TABLET | Freq: Once | ORAL | Status: AC
  Administered 2016-07-12: 17:00:00 1000 mg via ORAL
  Filled 2016-07-12: qty 5

## 2016-07-12 MED ORDER — MORPHINE SULFATE (PF) 4 MG/ML IV SOLN
1.0000 mg | INTRAVENOUS | Status: DC | PRN
  Administered 2016-07-12: 1 mg via INTRAVENOUS
  Filled 2016-07-12: qty 1

## 2016-07-12 MED ORDER — SODIUM CHLORIDE 0.9 % IV SOLN
Freq: Once | INTRAVENOUS | Status: AC
  Administered 2016-07-12: 10:00:00 via INTRAVENOUS

## 2016-07-12 MED ORDER — SODIUM CHLORIDE 0.9 % IV BOLUS (SEPSIS)
1000.0000 mL | Freq: Once | INTRAVENOUS | Status: AC
  Administered 2016-07-12: 1000 mL via INTRAVENOUS

## 2016-07-12 MED ORDER — SODIUM CHLORIDE 0.9 % IV BOLUS (SEPSIS)
500.0000 mL | Freq: Once | INTRAVENOUS | Status: AC
  Administered 2016-07-12: 20:00:00 500 mL via INTRAVENOUS

## 2016-07-12 MED ORDER — VITAMIN K1 1 MG/0.5ML IJ SOLN
1.0000 mg | Freq: Once | INTRAMUSCULAR | Status: AC
  Administered 2016-07-12: 1 mg via SUBCUTANEOUS
  Filled 2016-07-12: qty 0.5

## 2016-07-12 MED ORDER — CALCIUM CARBONATE ANTACID 1250 MG/5ML PO SUSP
1000.0000 mg | Freq: Once | ORAL | Status: DC
  Filled 2016-07-12 (×2): qty 10

## 2016-07-12 MED ORDER — SODIUM CHLORIDE 0.9 % IV SOLN
50.0000 ug/h | INTRAVENOUS | Status: DC
  Administered 2016-07-12 – 2016-07-13 (×3): 50 ug/h via INTRAVENOUS
  Filled 2016-07-12 (×7): qty 1

## 2016-07-12 MED ORDER — SODIUM CHLORIDE 0.9 % IV SOLN
Freq: Once | INTRAVENOUS | Status: AC
  Administered 2016-07-12: via INTRAVENOUS

## 2016-07-12 NOTE — Progress Notes (Addendum)
Pt up to bathroom with bloody stool and then cramping and feeling weak.  Back in bed and BP 83/35 HR 99 O2 94 on RA.  Drew 2000 hgb and sent.  Called DR Winona Legato and received oder to give 500 cc bolus wide open now.  Recheck blood pressure at end and if bp still low to give 1 liter bolus over 2 hours.  IF bp improved then NS at 100 cc/hr.  Pt reports decrease in cramping.  Pt alert and oriented. Wife at bedside and informed of plan. Dorna Bloom RN 2130 Pt bp did not improve and Hgb is 5. Spoke with Dr Ether Griffins and received order for 1 liter bolus now.  2nd liter if bp does not stabilize above 100.  Called lab and had them do the type and cross stat.  Spoke with blood bank and they say it will be 35 minutes before units are available.  1 liter bolus wide open running at this time.  BP improving to 111/48.  Pt continues to complain of left shoulder pain.  Order to transfer to CCU by Dr Ether Griffins.  Agricultural consultant notified.  Dorna Bloom RN [phone] Pt transferred to ICU 14 and report given to RN Alecia.  Pt vital signs 111/48, HR 91, Resp 16, 100% on RA.  Pt complains of pain to left shoulder blade.  Reported to ICU RN Estill Batten. Wife taken to waiting room via wheelchair and imformed of ICU policy that she cannot spend the night. Dorna Bloom RN

## 2016-07-12 NOTE — Consult Note (Signed)
Biliary Inpatient Follow-up Note  Patient Identification: Matthew Brown is a 76 y.o. male with GI bleed. EGD showed duodenal non bleeding ulcers and RBC tagged scan showed small bowel source of active bleeding. HGB today decreased to 7.0 . Surgery consulted and patient is not a candidate given high risk with underlying stage IV lymphoma. Source of SB bleed unclear if due to AVM or malignancy.   Subjective:  Scheduled Inpatient Medications:  . allopurinol  100 mg Oral Daily  . calcium carbonate (dosed in mg elemental calcium)  1,000 mg of elemental calcium Oral Once  . feeding supplement (ENSURE ENLIVE)  237 mL Oral TID BM  . finasteride  5 mg Oral Daily  . oxybutynin  5 mg Oral Daily  . pantoprazole  40 mg Intravenous Q12H  . polyethylene glycol-electrolytes  4,000 mL Oral Once  . predniSONE  5 mg Oral Daily  . sucralfate  1 g Oral TID WC & HS  . tacrolimus  3 mg Oral BID  . tamsulosin  0.4 mg Oral Daily    Continuous Inpatient Infusions:   . sodium chloride Stopped (07/12/16 0200)    PRN Inpatient Medications:  acetaminophen **OR** acetaminophen, ondansetron **OR** ondansetron (ZOFRAN) IV, oxyCODONE, senna-docusate  Review of Systems:  Constitutional: Weight is stable.  Eyes: No changes in vision. ENT: No oral lesions, sore throat.  GI: see HPI.  Heme/Lymph: No easy bruising.  CV: No chest pain.  GU: No hematuria.  Integumentary: No rashes.  Neuro: No headaches.  Psych: No depression/anxiety.  Endocrine: No heat/cold intolerance.  Allergic/Immunologic: No urticaria.  Resp: No cough, SOB.  Musculoskeletal: No joint swelling.    Physical Examination: BP (!) 109/48   Pulse 98   Temp 97.6 F (36.4 C) (Oral)   Resp 20   Ht 5\' 6"  (1.676 m)   Wt 55.3 kg (122 lb)   SpO2 100%   BMI 19.69 kg/m  Gen: NAD, alert and oriented x 4 HEENT: PEERLA, EOMI, Neck: supple, no JVD or thyromegaly Chest: CTA bilaterally, no wheezes, crackles, or other adventitious sounds CV: RRR,  no m/g/c/r Abd: soft, NT, ND, +BS in all four quadrants; no HSM, guarding, ridigity, or rebound tenderness Ext: no edema Skin: no rash or lesions noted   Data: Lab Results  Component Value Date   WBC 9.2 07/12/2016   HGB 7.0 (L) 07/12/2016   HCT 20.9 (L) 07/12/2016   MCV 85.3 07/12/2016   PLT 112 (L) 07/12/2016    Recent Labs Lab 07/11/16 0655 07/12/16 0504 07/12/16 0826  HGB 7.1* 7.5* 7.0*   Lab Results  Component Value Date   NA 138 07/12/2016   K 4.3 07/12/2016   CL 110 07/12/2016   CO2 27 07/12/2016   BUN 32 (H) 07/12/2016   CREATININE 0.94 07/12/2016   Lab Results  Component Value Date   ALT 8 (L) 07/12/2016   AST 12 (L) 07/12/2016   ALKPHOS 44 07/12/2016   BILITOT 0.5 07/12/2016    Recent Labs Lab 07/12/16 0826  INR 1.23    Assessment/Plan: Matthew Brown is a 76 y.o. male  With active SB bleed. HGB decreased to 7.0 . Unclear if etiology bleed secondary to AVM vs malignancy (lymphoma). Not a surgical candidate.   Recommendations: I think a VCE (small bowel capsule) study will help determine the cause of the bleed. Then a deep small bowel endoscopy (spiral or double balloon) might  be helpful.   Please call with questions or concerns.  San Jetty, MD

## 2016-07-12 NOTE — Progress Notes (Signed)
[place] [facility] [place] NAME: Matthew Brown    MR#:  [id number]  DATE OF BIRTH:  12-Jun-1941  SUBJECTIVE: he denies any complaints, no further  bm since yesterday.   CHIEF COMPLAINT:   Chief Complaint  Patient presents with  . Abnormal Lab  . GI Bleeding   Had black stools with blood in it and I looked in the commode.  REVIEW OF SYSTEMS:    Review of Systems  Constitutional: Positive for malaise/fatigue. Negative for chills and fever.  HENT: Negative for sore throat.   Eyes: Negative for blurred vision, double vision and pain.  Respiratory: Negative for cough, hemoptysis, shortness of breath and wheezing.   Cardiovascular: Negative for chest pain, palpitations, orthopnea and leg swelling.  Gastrointestinal: Negative for abdominal pain, constipation, diarrhea, heartburn, nausea and vomiting.  Genitourinary: Negative for dysuria and hematuria.  Musculoskeletal: Negative for back pain and joint pain.  Skin: Negative for rash.  Neurological: Positive for weakness. Negative for sensory change, speech change, focal weakness and headaches.  Endo/Heme/Allergies: Does not bruise/bleed easily.  Psychiatric/Behavioral: Negative for depression. The patient is not nervous/anxious.     DRUG ALLERGIES:  No Known Allergies  VITALS:  Blood pressure (!) 107/53, pulse 72, temperature 98.5 F (36.9 C), temperature source Oral, resp. rate 20, height 5\' 6"  (1.676 m), weight 55.3 kg (122 lb), SpO2 98 %.  PHYSICAL EXAMINATION:   Physical Exam  GENERAL:  76 y.o.-year-old patient lying in the bed with no acute distress.  EYES: Pupils equal, round, reactive to light and accommodation. No scleral icterus. Extraocular muscles intact.  HEENT: Head atraumatic, normocephalic. Oropharynx and nasopharynx clear.  NECK:  Supple, no jugular venous distention. No thyroid enlargement, no tenderness.  LUNGS: Normal breath sounds bilaterally, no wheezing, rales, rhonchi. No  use of accessory muscles of respiration.  CARDIOVASCULAR: S1, S2 normal. No murmurs, rubs, or gallops.  ABDOMEN: Soft, nontender, nondistended. Bowel sounds present. No organomegaly or mass.  EXTREMITIES: No cyanosis, clubbing or edema b/l.    NEUROLOGIC: Cranial nerves II through XII are intact. No focal Motor or sensory deficits b/l.   PSYCHIATRIC: The patient is alert and oriented x 3.  SKIN: No obvious rash, lesion, or ulcer.   LABORATORY PANEL:   CBC  Recent Labs Lab 07/12/16 0826  WBC 9.2  HGB 7.0*  HCT 20.9*  PLT 112*   ------------------------------------------------------------------------------------------------------------------ Chemistries   Recent Labs Lab 07/09/16 2252 07/12/16 0826  NA  --  138  K 3.9 4.3  CL  --  110  CO2  --  27  GLUCOSE  --  102*  BUN  --  32*  CREATININE  --  0.94  CALCIUM  --  8.8*  MG 1.7  --   AST  --  12*  ALT  --  8*  ALKPHOS  --  44  BILITOT  --  0.5   ------------------------------------------------------------------------------------------------------------------  Cardiac Enzymes No results for input(s): TROPONINI in the last 168 hours. ------------------------------------------------------------------------------------------------------------------  RADIOLOGY:  Nm Gi Blood Loss  Result Date: 07/11/2016 CLINICAL DATA:  GI bleeding EXAM: NUCLEAR MEDICINE GASTROINTESTINAL BLEEDING SCAN TECHNIQUE: Sequential abdominal images were obtained following intravenous administration of Tc-73m labeled red blood cells. RADIOPHARMACEUTICALS:  19.80 mCi Tc-64m in-vitro labeled autologous red cells. COMPARISON:  CT abdomen and pelvis 06/25/2016 FINDINGS: At 13 minutes into the exam, an abnormal focus of labeled red cell accumulation begins to identify in the RIGHT lateral abdomen. Over the course of the first hour, tracer  passes through a series of curvilinear bowel loops in the RIGHT mid abdomen. The pattern of tracer movement fails to  follow the expected course of the colon as demonstrated on CT. Findings are consistent with an active source of GI bleeding within small bowel in the RIGHT mid abdomen. IMPRESSION: Abnormal exam demonstrating presence of an active GI bleed in the lateral RIGHT mid abdomen coursing through multiple curvilinear bowel loops in the RIGHT mid abdomen favoring a small bowel source of bleeding. Electronically Signed   By: Lavonia Dana M.D.   On: 07/11/2016 16:38     ASSESSMENT AND PLAN:   76 year old male with lymphoma currently on chemotherapy who had a follow-up echo in our clinic GI for anemia by the request of Dr. Mike Gip and has been having dark-colored stools which are guaiac positive.  1. Acute on chronic anemia:Worsening Positive bleeding scan. Spoke with surgery. Patient hemodynamically stable at this time no further bleeding, because of thrombocytopenia high risk for surgery. Patient will get platelets and blood products as necessary because of thrombocytopenia and anemia.  because of his advanced cancer, extensive history including renal transplant I will talk to wife about his CODE STATUS.  4. Stage IV diffuse B cell lymphoma currently undergoing RCHOP therapy  5. Persistent atrial fibrillation Coumadin has been discontinued due to GI bleed  6. History of renal transplant: Continue Prograf and prednisone  7. BPH on tamsulosin and finasteride  8. Protein calorie malnutrition: Continue feeding supplements.  D/w wife All the records are reviewed and case discussed with Care Management/Social Workerr. Management plans discussed with the patient, family and they are in agreement.  CODE STATUS: FULL CODE  DVT Prophylaxis: SCDs  TOTAL CC TIME TAKING CARE OF THIS PATIENT: 40 minutes.   POSSIBLE D/C IN 1-2 DAYS, DEPENDING ON CLINICAL CONDITION.  Epifanio Lesches M.D on 07/12/2016 at 1:26 PM  Between 7am to 6pm - Pager - [phone]  After 6pm go to [email or web address] - password [email or web address]  [facility]  Office  [phone]  CC: Primary care physician; Enid Derry, MD  Note: This dictation was prepared with Dragon dictation along with smaller phrase technology. Any transcriptional errors that result from this process are unintentional.

## 2016-07-12 NOTE — Progress Notes (Signed)
Discussed with Dr. Delana Meyer regarding embolization for the small bowel bleed positive on Tagged R BC scan. He has advised this would not be ideal due to multiple areas and long stretch of bleeding and advised general surgery to consider Small bowel resection.  Discussed with Dr. Azalee Course. Who has discussed with radiology.  Dr. Burt Knack to see later in the day.

## 2016-07-12 NOTE — Progress Notes (Signed)
[facility] Hematology/Oncology Progress Note  Date of admission: 07/08/2016  Hospital day:  07/12/2016   Chief Complaint: Matthew Brown is a 76 y.o. 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  HPI:  The patient has diffuse large B cell lymphoma s/p 3 cycles of mini-RCHOP (last 06/20/2016) with Neulasta support.  He has tolerated his chemotherapy well with improvement in his overall health.  Abdomen and pelvic CT scan on 06/25/2016 revealed decrease in retroperitoneal and mesenteric adenopathy.  He was admitted to [facility] from 06/25/2016 - 06/28/2016 with gastroenteritis.  CBC on 06/26/2016 revealed a hematocrit of 27.4, hemoglobin 9.1, MCV 82.7, platelets 92,000, and WBC 13,900 with an [place] of 12,900.  CBC in clinic on 07/07/2016 revealed a hematocrit of 16.4, hemoglobin 5.3, and platelets 181,000.  He received 2 units of PRBCs.  Hemoglobin was 7.8 on 07/09/2016.  He noted dark colored stool and bright red blood on 07/06/2016.  He was seen by Dr. Vicente Males.  He underwent EGD on 07/09/2016.  There were multiple non-bleeding duodenal ulcer as well as duodenitis.  He underwent tagged RBC scan on 07/11/2016.  Imaging revealed active GI bleed in the lateral right mid abdomen coursing through multiple curvilinear bowel loops in the right mid abdomen favoring a small bowel source of bleeding.  Prior to admission, he was on an aspirin daily.  Aspirin was discontinued on admission.  Platelet function assay was abnormal.  INR is 1.23.  Today, he received 2 units of pheresed platelets.   Hemoglobin is 7.0.  He notes a history of GI bleeding in 08/2014.  He underwent tagged RBC scan which revealed an active bleed in the hepatic flexure.  He was embolized in VIR.  Subjective:  Feels "ok".  He notes mid abdominal discomfort.  No recent rectal bleeding.  Social History: The patient is alone today.  Allergies:  No Known Allergies  Scheduled Medications: . allopurinol  100 mg Oral Daily  . calcium carbonate (dosed in mg elemental calcium)  1,000 mg of elemental calcium Oral Once  . feeding supplement (ENSURE ENLIVE)  237 mL Oral TID BM  . finasteride  5 mg Oral Daily  . oxybutynin  5 mg Oral Daily  . pantoprazole  40 mg Intravenous Q12H  . polyethylene glycol-electrolytes  4,000 mL Oral Once  . predniSONE  5 mg Oral Daily  . sucralfate  1 g Oral TID WC & HS  . tacrolimus  3 mg Oral BID  . tamsulosin  0.4 mg Oral Daily    Review of Systems: GENERAL:  Fatigue.  No fevers or sweats.  Weight loss. PERFORMANCE STATUS (ECOG):  1 Lungs: No shortness of breath or cough. No hemoptysis. Cardiac: No chest pain, palpitations, orthopnea, or PND. GI:  Mid abdominal discomfort.  Dark stools and red blood prior to admission.  No nausea, vomiting, diarrhea, or constipation. GU: No urgency, frequency, dysuria, or hematuria. Musculoskeletal:  No joint pain. No muscle tenderness. Extremities: No pain or swelling. Skin: No rashes or skin changes. Neuro: General weakness.  No numbness or weakness, balance or coordination issues. Endocrine: No diabetes, thyroid issues, hot flashes or night sweats. Psych: No mood changes, depression or anxiety. Pain: No pain. Review of systems: All other systems reviewed and found to be negative.  Physical Exam: Blood pressure (!) 99/41, pulse 78, temperature 97.5 F (36.4 C), temperature source Oral, resp. rate 18, height _0  (1.676 m), weight 122  lb (55.3 kg), SpO2 100 %.  GENERAL:Thin elderly gentleman lying comfortably on the medical unit in no acute distress. MENTAL STATUS: Alert and oriented to person, place and time. HEAD:Alopecia.  [place]. Normocephalic, atraumatic, face symmetric, no Cushingoid features. EYES:Glasses. Brown eyes. Pupils equal round and reactive to light and accomodation. No conjunctivitis or scleral  icterus. LSL:HTDSKAJGOT clear without lesion. Edentulous. Tonguenormal. Mucous membranes moist. RESPIRATORY:Decreased breath sounds at the bases.  Clear to auscultationwithout rales, wheezes or rhonchi. CARDIOVASCULAR:Regular rate andrhythmwithout murmur, rub or gallop. ABDOMEN:Soft, minimally tender in mid abdominal area without guarding or rebound tenderness. Active bowel sounds and no hepatosplenomegaly. No masses.  SKIN: No rashes, ulcers or lesions. EXTREMITIES: No edema, no skin discoloration or tenderness. No palpable cords. NEUROLOGICAL: Unremarkable. PSYCH: Appropriate.   Results for orders placed or performed during the hospital encounter of 07/08/16 (from the past 48 hour(s))  Hemoglobin and hematocrit, blood     Status: Abnormal   Collection Time: 07/10/16  8:00 PM  Result Value Ref Range   Hemoglobin 7.8 (L) 13.0 - 18.0 g/dL   HCT 22.9 (L) 40.0 - 52.0 %  Hemoglobin     Status: Abnormal   Collection Time: 07/11/16  6:55 AM  Result Value Ref Range   Hemoglobin 7.1 (L) 13.0 - 18.0 g/dL  Prepare RBC     Status: None   Collection Time: 07/11/16  9:52 AM  Result Value Ref Range   Order Confirmation ORDER PROCESSED BY BLOOD BANK   Platelet function assay     Status: Abnormal   Collection Time: 07/11/16  9:53 PM  Result Value Ref Range   Collagen / ADP 190 (H) 0 - 118 seconds   PFA Interpretation            Comment: Platelet function is abnormal. This pattern is most commonly seen in patients with von Willebrand disease or congenital platelet defects.  Prolongation of the Col/ADP closure time generally indicate a more extensive qualitative platelet defect and/or abnormality in von Willebrand factor.        Results of the test should always be interpreted in conjunction with the patient's medical history, clinical presentation and medication history. Patients with Hematocrit values <35.0% or Platelet counts <150,000/uL may result in values above the  Laboratory established reference range.    Collagen / Epinephrine 211 (H) 0 - 193 seconds    Comment: Performed at [facility], [address] [place], [place] [address]  Platelet count     Status: Abnormal   Collection Time: 07/11/16  9:53 PM  Result Value Ref Range   Platelets 117 (L) 150 - 440 K/uL  Hemoglobin     Status: Abnormal   Collection Time: 07/12/16  5:04 AM  Result Value Ref Range   Hemoglobin 7.5 (L) 13.0 - 18.0 g/dL  Protime-INR     Status: Abnormal   Collection Time: 07/12/16  8:26 AM  Result Value Ref Range   Prothrombin Time 15.6 (H) 11.4 - 15.2 seconds   INR 1.23   CBC     Status: Abnormal   Collection Time: 07/12/16  8:26 AM  Result Value Ref Range   WBC 9.2 3.8 - 10.6 K/uL   RBC 2.45 (L) 4.40 - 5.90 MIL/uL   Hemoglobin 7.0 (L) 13.0 - 18.0 g/dL   HCT 20.9 (L) 40.0 - 52.0 %   MCV 85.3 80.0 - 100.0 fL   MCH 28.6 26.0 - 34.0 pg   MCHC 33.6 32.0 - 36.0 g/dL   RDW 21.8 (  H) 11.5 - 14.5 %   Platelets 112 (L) 150 - 440 K/uL  Comprehensive metabolic panel     Status: Abnormal   Collection Time: 07/12/16  8:26 AM  Result Value Ref Range   Sodium 138 135 - 145 mmol/L    Comment: RESULT REPEATED AND VERIFIED   Potassium 4.3 3.5 - 5.1 mmol/L   Chloride 110 101 - 111 mmol/L   CO2 27 22 - 32 mmol/L   Glucose, Bld 102 (H) 65 - 99 mg/dL   BUN 32 (H) 6 - 20 mg/dL   Creatinine, Ser 0.94 0.61 - 1.24 mg/dL   Calcium 8.8 (L) 8.9 - 10.3 mg/dL   Total Protein 3.9 (L) 6.5 - 8.1 g/dL   Albumin 2.2 (L) 3.5 - 5.0 g/dL   AST 12 (L) 15 - 41 U/L   ALT 8 (L) 17 - 63 U/L   Alkaline Phosphatase 44 38 - 126 U/L   Total Bilirubin 0.5 0.3 - 1.2 mg/dL   GFR calc non Af Amer >60 >60 mL/min   GFR calc Af Amer >60 >60 mL/min    Comment: (NOTE) The eGFR has been calculated using the CKD EPI equation. This calculation has not been validated in all clinical situations. eGFR's persistently <60 mL/min signify possible Chronic Kidney Disease.    Anion gap 1 (L) 5 - 15  Prepare Pheresed  Platelets     Status: None (Preliminary result)   Collection Time: 07/12/16  9:30 AM  Result Value Ref Range   ISSUE DATE / TIME 703500938182    Blood Product Unit Number [id number]    PRODUCT CODE E3056V00    Unit Type and Rh 5100    Blood Product Expiration Date 201801222359    ISSUE DATE / TIME [id number]    Blood Product Unit Number [id number]    PRODUCT CODE E3057V00    Unit Type and Rh 6200    Blood Product Expiration Date 315400867619    Nm Gi Blood Loss  Result Date: 07/11/2016 CLINICAL DATA:  GI bleeding EXAM: NUCLEAR MEDICINE GASTROINTESTINAL BLEEDING SCAN TECHNIQUE: Sequential abdominal images were obtained following intravenous administration of Tc-59mlabeled red blood cells. RADIOPHARMACEUTICALS:  19.80 mCi Tc-948mn-vitro labeled autologous red cells. COMPARISON:  CT abdomen and pelvis 06/25/2016 FINDINGS: At 13 minutes into the exam, an abnormal focus of labeled red cell accumulation begins to identify in the RIGHT lateral abdomen. Over the course of the first hour, tracer passes through a series of curvilinear bowel loops in the RIGHT mid abdomen. The pattern of tracer movement fails to follow the expected course of the colon as demonstrated on CT. Findings are consistent with an active source of GI bleeding within small bowel in the RIGHT mid abdomen. IMPRESSION: Abnormal exam demonstrating presence of an active GI bleed in the lateral RIGHT mid abdomen coursing through multiple curvilinear bowel loops in the RIGHT mid abdomen favoring a small bowel source of bleeding. Electronically Signed   By: MaLavonia Dana.D.   On: 07/11/2016 16:38    Assessment:  Matthew GIENGERs a [age].o. male with stage IVBE diffuse large B cell lymphoma. He has a post-transplant lymphoproliferative disorder (PTLD).  He is s/p 3 cycles of mini-RCHOP (last 06/20/2016).  Abdomen and pelvic CT scan on 06/25/2016 revealed decrease in retroperitoneal and mesenteric adenopathy.  He was admitted with GI  bleeding.  EGD on 07/09/2016 revealed multiple non-bleeding duodenal ulcer as well as duodenitis.  Tagged RBC scan on 07/11/2016 revealed active GI bleed in  the lateral right mid abdomen coursing through multiple curvilinear bowel loops in the right mid abdomen favoring a small bowel source of bleeding (? AVM).  He has a history of GI bleeding in 08/2014.  He underwent tagged RBC scan which revealed an active bleed in the hepatic flexure.  He was embolized in VIR.  He is s/p renal transplant.  He is on tacrolimus (Prograf) and steroids.  Plan:   1.  Oncology:  Patient is s/p 3 cycles of mini-RCHOP.  Scans reveal improving disease.  He was scheduled to receive cycle #4 on 07/11/2016, but was postponed secondary to current GI bleeding.  2.  Hematology:  Normocytic anemia secondary to GI bleeding, iron deficiency, anemia of chronic disease, and chemotherapy induced anemia.  Mild thrombocytopenia secondary to consumption.  Maintain active type and screen.  Transfuse leukopoor and irradiated PRBCS to maintain a hemoglobin > 8.  Platelets transfused today for qualitative platelet disorder likely secondary to home aspirin use, now discontinued.  Patient received Venofer for iron deficiency.  Patient received vitamin K.  INR on admission was 1.23.  3.  Gastroenterology:  Apparent small bowel bleed.  Agree with aggressive supportive care.  Per GI, consideration for video capsule study then possible deep small bowel endoscopy.  Per surgery, patient would be high risk for surgery.  He has received much of his care at [facility] for his renal transplant.  He may be a candidate for embolization if a source can be identified as it was in 2016.  Agree with consideration of octreotide.  4.  Nephrology:  Patient is s/p renal transplant on Prograf and prednisone.  Creatinine is stable (0.94).   5.  Nutrition:  Patient had lost 30 pounds prior to diagnosis.  Since treatment, he has been eating better, but remains  malnourished.  Albumen is 2.2.  Agree with nutrition consult.  Patient currently NPO secondary to GI bleeding.  6.  Code status:  Full Code.   Lequita Asal, MD  07/12/2016, 3:42 PM

## 2016-07-12 NOTE — Progress Notes (Addendum)
76 yr old male with Stage IV diffuse B cell lymphoma currently undergoing RCHOP therapy, renal transplant patient in 2007 on immunosuppressants, A fib on Coumadin last dose on 1/15, and history of GI bleeds, now with + tagged scan positive for multiple areas in the small bowel (vague wording of R lateral abdomen).  Patient has not had any further stools overnight and vital signs continue to be hemodynamically normal.   Vitals:   07/11/16 2054 07/12/16 0502  BP: (!) 109/55 (!) 101/44  Pulse: 71 73  Resp:  20  Temp: 97.9 F (36.6 C) 98 F (36.7 C)   I/O last 3 completed shifts: In: 815.3 [P.O.:240; I.V.:175.3; Blood:400] Out: 1175 [Urine:1175] No intake/output data recorded.   PE:  Gen: NAD Res: CTAB/L no distress Cardio: RRR [id number], non tender, well healed surgical scars Ext: no edema  CBC Latest Ref Rng & Units 07/12/2016 07/11/2016 07/10/2016  WBC 3.8 - 10.6 K/uL - - -  Hemoglobin 13.0 - 18.0 g/dL 7.5(L) 7.1(L) 7.8(L)  Hematocrit 40.0 - 52.0 % - - 22.9(L)  Platelets 150 - 440 K/uL - 117(L) -   CMP Latest Ref Rng & Units 07/09/2016 07/09/2016 07/08/2016  Glucose 65 - 99 mg/dL - 94 154(H)  BUN 6 - 20 mg/dL - 26(H) 37(H)  Creatinine 0.61 - 1.24 mg/dL - 0.95 1.21  Sodium 135 - 145 mmol/L - 139 137  Potassium 3.5 - 5.1 mmol/L 3.9 4.2 4.3  Chloride 101 - 111 mmol/L - 109 105  CO2 22 - 32 mmol/L - 28 27  Calcium 8.9 - 10.3 mg/dL - 9.0 10.1  Total Protein 6.5 - 8.1 g/dL - - 5.2(L)  Total Bilirubin 0.3 - 1.2 mg/dL - - 0.6  Alkaline Phos 38 - 126 U/L - - 73  AST 15 - 41 U/L - - 24  ALT 17 - 63 U/L - - 10(L)   A/P:  76 yr old male with Stage IV diffuse B cell lymphoma currently undergoing RCHOP therapy, renal transplant patient in 2007 on immunosuppressants, A fib (Coumadin d/c'd 2016)  and history of GI bleeds, now with + tagged scan positive for multiple areas in the small bowel (vague wording of R lateral abdomen).    Patient is not hemodynamically unstable, furthermore his  platelets are low at 117 and his platelet function assay suggests that what he does have is not functional.  Additionally he has not had PT/INR or PTT or liver function studies to see if there are coagulation issues that could be potentially reversed.  These areas seen on the tagged scan are not specific and with my limited experience reading appear to be coming from about three places in the small bowel that could be multiple feet apart and would be impossible to accurately locate in the operating room.   He currently does not need an emergent operation but if he were to need one would be very high risks and since the entire small bowel cannot be removed would potentially need multiple operations and may be better served at [facility], who has treated his GI bleeding in the past and performed his renal transplant.  All attempts should be made to give him the best chance to clot the bleeding including giving platelets and any other products that would be necessary if any of the other lab results are abnormal.  Also PPI gtt, beta blocker, ocetriotide and possibly using vasopressin may help him stop the bleeding as well.  Additionally continued discussions will be had with the  patient and his family, given his advanced cancer and history of GI bleeds extensive operation would not add quality or prolong his life.

## 2016-07-12 NOTE — Progress Notes (Signed)
Dr Princella Ion made aware of black stool with red mix reported by previous nurse, states to continue with the ordered CBC

## 2016-07-12 NOTE — Progress Notes (Signed)
Dr Vianne Bulls made aware of pt having black stool with red mix per report from previous nurse, new order for stat CBC

## 2016-07-12 NOTE — Plan of Care (Signed)
Problem: Fluid Volume: Goal: Will show no signs and symptoms of excessive bleeding Outcome: Progressing No BM this shift

## 2016-07-13 ENCOUNTER — Ambulatory Visit (HOSPITAL_COMMUNITY)
Admission: AD | Admit: 2016-07-13 | Discharge: 2016-07-13 | Disposition: A | Discharge status: Home / Self Care | Payer: Medicare HMO | Source: Other Acute Inpatient Hospital | Attending: Internal Medicine | Admitting: Internal Medicine

## 2016-07-13 DIAGNOSIS — K922 Gastrointestinal hemorrhage, unspecified: Secondary | ICD-10-CM | POA: Insufficient documentation

## 2016-07-13 LAB — CBC
HEMATOCRIT: 18.6 % — AB (ref 40.0–52.0)
HEMATOCRIT: 23 % — AB (ref 40.0–52.0)
HEMOGLOBIN: 8 g/dL — AB (ref 13.0–18.0)
Hemoglobin: 6.3 g/dL — ABNORMAL LOW (ref 13.0–18.0)
MCH: 28.4 pg (ref 26.0–34.0)
MCH: 29.7 pg (ref 26.0–34.0)
MCHC: 33.9 g/dL (ref 32.0–36.0)
MCHC: 34.6 g/dL (ref 32.0–36.0)
MCV: 83.9 fL (ref 80.0–100.0)
MCV: 85.7 fL (ref 80.0–100.0)
Platelets: 124 10*3/uL — ABNORMAL LOW (ref 150–440)
Platelets: 121 10*3/uL — ABNORMAL LOW (ref 150–440)
RBC: 2.22 MIL/uL — AB (ref 4.40–5.90)
RBC: 2.68 MIL/uL — ABNORMAL LOW (ref 4.40–5.90)
RDW: 16.7 % — AB (ref 11.5–14.5)
RDW: 17.2 % — ABNORMAL HIGH (ref 11.5–14.5)
WBC: 11.1 10*3/uL — AB (ref 3.8–10.6)
WBC: 11.8 10*3/uL — ABNORMAL HIGH (ref 3.8–10.6)

## 2016-07-13 LAB — PREPARE PLATELET PHERESIS
UNIT DIVISION: 0
Unit division: 0

## 2016-07-13 LAB — PREPARE RBC (CROSSMATCH)

## 2016-07-13 LAB — HEMOGLOBIN AND HEMATOCRIT, BLOOD
HCT: 22.8 % — ABNORMAL LOW (ref 40.0–52.0)
Hemoglobin: 8 g/dL — ABNORMAL LOW (ref 13.0–18.0)

## 2016-07-13 LAB — FIBRINOGEN: FIBRINOGEN: 218 mg/dL (ref 210–475)

## 2016-07-13 MED ORDER — SODIUM CHLORIDE 0.9 % IV SOLN: Freq: Once | INTRAVENOUS | Status: AC

## 2016-07-13 MED ORDER — SODIUM CHLORIDE 0.9 % IV SOLN
INTRAVENOUS | Status: DC
  Administered 2016-07-13: 07:00:00 via INTRAVENOUS

## 2016-07-13 MED ORDER — SODIUM CHLORIDE 0.9 % IV SOLN
Freq: Once | INTRAVENOUS | Status: AC
  Administered 2016-07-13: 10 mL/h via INTRAVENOUS

## 2016-07-13 MED ORDER — STERILE WATER FOR INJECTION IJ SOLN
INTRAMUSCULAR | Status: AC
  Administered 2016-07-13: 20 mL
  Filled 2016-07-13: qty 10

## 2016-07-13 NOTE — Progress Notes (Signed)
Accepted  At [facility].

## 2016-07-13 NOTE — Progress Notes (Signed)
Pt left via stretcher with [facility]. Pt to be transported to [facility] room 4314. Belongings sent with pt. Pt's wife aware of transport. Brylee Berk E 9:35 PM 07/13/2016

## 2016-07-13 NOTE — Progress Notes (Signed)
76 yr old male with Stage IV diffuse B cell lymphoma currently undergoing RCHOP therapy, renal transplant patient in 2007 on immunosuppressants, A fib on Coumadin, and history of GI bleeds, now with + tagged scan positive for multiple areas in the small bowel (vague wording of R lateral abdomen).  Patient with more bloody stools, Hg drop to 5 with some hypotension overnight, transferred to the ICU.   Vitals:   07/13/16 1048 07/13/16 1100  BP: (!) 121/98 (!) 120/36  Pulse: 71 70  Resp: 20 (!) 23  Temp: 97.4 F (36.3 C)    I/O last 3 completed shifts: In: 5191.6 [I.V.:508.3; Blood:1683.3; IV Piggyback:3000] Out: 400 [Urine:400] Total I/O In: 750.8 [I.V.:430.8; Blood:320] Out: -    PE:  Gen: NAD Res: CTAB/L no distress Cardio: RRR [id number], non tender, well healed surgical scars Ext: no edema  CBC Latest Ref Rng & Units 07/13/2016 07/12/2016 07/12/2016  WBC 3.8 - 10.6 K/uL 11.1(H) 17.4(H) 9.2  Hemoglobin 13.0 - 18.0 g/dL 6.3(L) 5.0(L) 7.0(L)  Hematocrit 40.0 - 52.0 % 18.6(L) 15.0(L) 20.9(L)  Platelets 150 - 440 K/uL 124(L) 227 112(L)   CMP Latest Ref Rng & Units 07/12/2016 07/09/2016 07/09/2016  Glucose 65 - 99 mg/dL 102(H) - 94  BUN 6 - 20 mg/dL 32(H) - 26(H)  Creatinine 0.61 - 1.24 mg/dL 0.94 - 0.95  Sodium 135 - 145 mmol/L 138 - 139  Potassium 3.5 - 5.1 mmol/L 4.3 3.9 4.2  Chloride 101 - 111 mmol/L 110 - 109  CO2 22 - 32 mmol/L 27 - 28  Calcium 8.9 - 10.3 mg/dL 8.8(L) - 9.0  Total Protein 6.5 - 8.1 g/dL 3.9(L) - -  Total Bilirubin 0.3 - 1.2 mg/dL 0.5 - -  Alkaline Phos 38 - 126 U/L 44 - -  AST 15 - 41 U/L 12(L) - -  ALT 17 - 63 U/L 8(L) - -   A/P:  76 yr old male with Stage IV diffuse B cell lymphoma currently undergoing RCHOP therapy, renal transplant patient in 2007 on immunosuppressants, A fib (Coumadin d/c'd 2016)  and history of GI bleeds, now with + tagged scan positive for multiple areas in the small bowel (vague wording of R lateral abdomen).    Patient worsened  overnight.  Discussed with medicine and GI teams that patient needs transfer to tertiary care center that has push enteroscopy.  He currently does not need an emergent operation but if he were to need one would be very high risks and since the entire small bowel cannot be removed would potentially need multiple operations and may be better served at [facility], who has treated his GI bleeding in the past and performed his renal transplant.

## 2016-07-13 NOTE — Progress Notes (Signed)
[facility] Hematology/Oncology Progress Note  Date of admission: 07/08/2016  Hospital day:  07/13/2016   Chief Complaint: Matthew Brown is a 76 y.o. 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  Subjective:  Patient transferred to ICU overnight.  Feels bloated with mid abdominal discomfort.   Feels likes he needs to have another bowel movement.  Social History: The patient is accompanied by his nurse today.  Allergies: No Known Allergies  Scheduled Medications: . allopurinol  100 mg Oral Daily  . feeding supplement (ENSURE ENLIVE)  237 mL Oral TID BM  . finasteride  5 mg Oral Daily  . oxybutynin  5 mg Oral Daily  . pantoprazole  40 mg Intravenous Q12H  . polyethylene glycol-electrolytes  4,000 mL Oral Once  . predniSONE  5 mg Oral Daily  . sucralfate  1 g Oral TID WC & HS  . tacrolimus  3 mg Oral BID  . tamsulosin  0.4 mg Oral Daily    Review of Systems: GENERAL:  Fatigue.  No fevers or sweats.  Weight loss. PERFORMANCE STATUS (ECOG):  1 Lungs: No shortness of breath or cough. No hemoptysis. Cardiac: No chest pain, palpitations, orthopnea, or PND. GI:  Mid abdominal discomfort/cramping.  Bloody stool.  No nausea, vomiting, diarrhea, or constipation. GU: No urgency, frequency, dysuria, or hematuria. Musculoskeletal:  No joint pain. No muscle tenderness. Extremities: No pain or swelling. Skin: No rashes or skin changes. Neuro: General weakness.  No numbness or weakness, balance or coordination issues. Endocrine: No diabetes, thyroid issues, hot flashes or night sweats. Psych: No mood changes, depression or anxiety. Pain: No pain. Review of systems: All other systems reviewed and found to be negative.  Physical Exam: Blood pressure (!) 109/38, pulse 69, temperature 97.6 F (36.4 C), temperature source Oral, resp. rate (!) 26, height _0  (1.676 m), weight  120 lb 13 oz (54.8 kg), SpO2 100 %.  GENERAL:Thin elderly gentleman lying comfortably in the ICU in no acute distress. MENTAL STATUS: Alert and oriented to person, place and time. HEAD:Alopecia.  Matthew Brown. Normocephalic, atraumatic, face symmetric, no Cushingoid features. EYES:Glasses. Brown eyes. Pupils equal round and reactive to light and accomodation. No conjunctivitis or scleral icterus. YBO:FBPZWCHENI clear without lesion. Edentulous. Tonguenormal. Mucous membranes moist. RESPIRATORY:Decreased breath sounds at the bases.  Clear to auscultationwithout rales, wheezes or rhonchi. CARDIOVASCULAR:Regular rate andrhythmwithout murmur, rub or gallop. ABDOMEN:Soft, minimally tender in mid abdominal area without guarding or rebound tenderness. Active bowel sounds and no hepatosplenomegaly. No masses.  SKIN: No rashes, ulcers or lesions. EXTREMITIES: No edema, no skin discoloration or tenderness. No palpable cords. NEUROLOGICAL: Unremarkable. PSYCH: Appropriate.   Results for orders placed or performed during the hospital encounter of 07/08/16 (from the past 48 hour(s))  Platelet function assay     Status: Abnormal   Collection Time: 07/11/16  9:53 PM  Result Value Ref Range   Collagen / ADP 190 (H) 0 - 118 seconds   PFA Interpretation            Comment: Platelet function is abnormal. This pattern is most commonly seen in patients with von Willebrand disease or congenital platelet defects.  Prolongation of the Col/ADP closure time generally indicate a more extensive qualitative platelet defect and/or abnormality in von Willebrand factor.        Results of the test should always be interpreted in conjunction with the patient's medical history, clinical presentation and medication  history. Patients with Hematocrit values <35.0% or Platelet counts <150,000/uL may result in values above the Laboratory established reference range.    Collagen /  Epinephrine 211 (H) 0 - 193 seconds    Comment: Performed at [facility], [address] [place], [place] [address]  Platelet count     Status: Abnormal   Collection Time: 07/11/16  9:53 PM  Result Value Ref Range   Platelets 117 (L) 150 - 440 K/uL  Hemoglobin     Status: Abnormal   Collection Time: 07/12/16  5:04 AM  Result Value Ref Range   Hemoglobin 7.5 (L) 13.0 - 18.0 g/dL  Protime-INR     Status: Abnormal   Collection Time: 07/12/16  8:26 AM  Result Value Ref Range   Prothrombin Time 15.6 (H) 11.4 - 15.2 seconds   INR 1.23   CBC     Status: Abnormal   Collection Time: 07/12/16  8:26 AM  Result Value Ref Range   WBC 9.2 3.8 - 10.6 K/uL   RBC 2.45 (L) 4.40 - 5.90 MIL/uL   Hemoglobin 7.0 (L) 13.0 - 18.0 g/dL   HCT 20.9 (L) 40.0 - 52.0 %   MCV 85.3 80.0 - 100.0 fL   MCH 28.6 26.0 - 34.0 pg   MCHC 33.6 32.0 - 36.0 g/dL   RDW 21.8 (H) 11.5 - 14.5 %   Platelets 112 (L) 150 - 440 K/uL  Comprehensive metabolic panel     Status: Abnormal   Collection Time: 07/12/16  8:26 AM  Result Value Ref Range   Sodium 138 135 - 145 mmol/L    Comment: RESULT REPEATED AND VERIFIED   Potassium 4.3 3.5 - 5.1 mmol/L   Chloride 110 101 - 111 mmol/L   CO2 27 22 - 32 mmol/L   Glucose, Bld 102 (H) 65 - 99 mg/dL   BUN 32 (H) 6 - 20 mg/dL   Creatinine, Ser 0.94 0.61 - 1.24 mg/dL   Calcium 8.8 (L) 8.9 - 10.3 mg/dL   Total Protein 3.9 (L) 6.5 - 8.1 g/dL   Albumin 2.2 (L) 3.5 - 5.0 g/dL   AST 12 (L) 15 - 41 U/L   ALT 8 (L) 17 - 63 U/L   Alkaline Phosphatase 44 38 - 126 U/L   Total Bilirubin 0.5 0.3 - 1.2 mg/dL   GFR calc non Af Amer >60 >60 mL/min   GFR calc Af Amer >60 >60 mL/min    Comment: (NOTE) The eGFR has been calculated using the CKD EPI equation. This calculation has not been validated in all clinical situations. eGFR's persistently <60 mL/min signify possible Chronic Kidney Disease.    Anion gap 1 (L) 5 - 15  Prepare Pheresed Platelets     Status: None   Collection Time: 07/12/16   9:30 AM  Result Value Ref Range   Unit Number [id number]    Blood Component Type PLTPHER LRI1    Unit division 00    Status of Unit ISSUED,FINAL    Transfusion Status OK TO TRANSFUSE    Unit Number [id number]    Blood Component Type PLTPHER LI2    Unit division 00    Status of Unit ISSUED,FINAL    Transfusion Status OK TO TRANSFUSE   CBC with Differential/Platelet     Status: Abnormal   Collection Time: 07/12/16  7:22 PM  Result Value Ref Range   WBC 17.4 (H) 3.8 - 10.6 K/uL   RBC 1.73 (L) 4.40 - 5.90 MIL/uL   Hemoglobin 5.0 (  L) 13.0 - 18.0 g/dL    Comment: RESULT REPEATED AND VERIFIED   HCT 15.0 (L) 40.0 - 52.0 %   MCV 87.1 80.0 - 100.0 fL   MCH 28.9 26.0 - 34.0 pg   MCHC 33.1 32.0 - 36.0 g/dL   RDW 22.1 (H) 11.5 - 14.5 %   Platelets 227 150 - 440 K/uL    Comment: RESULT REPEATED AND VERIFIED   Neutrophils Relative % 83 %   Lymphocytes Relative 10 %   Monocytes Relative 7 %   Eosinophils Relative 0 %   Basophils Relative 0 %   Neutro Abs 14.5 (H) 1.4 - 6.5 K/uL   Lymphs Abs 1.7 1.0 - 3.6 K/uL   Monocytes Absolute 1.2 (H) 0.2 - 1.0 K/uL   Eosinophils Absolute 0.0 0 - 0.7 K/uL   Basophils Absolute 0.0 0 - 0.1 K/uL   RBC Morphology POLYCHROMASIA PRESENT     Comment: MIXED RBC POPULATION  APTT     Status: None   Collection Time: 07/12/16  8:23 PM  Result Value Ref Range   aPTT 33 24 - 36 seconds  Fibrinogen     Status: Abnormal   Collection Time: 07/12/16  8:23 PM  Result Value Ref Range   Fibrinogen 183 (L) 210 - 475 mg/dL  Type and screen [facility]     Status: None (Preliminary result)   Collection Time: 07/12/16  8:23 PM  Result Value Ref Range   ABO/RH(D) O POS    Antibody Screen NEG    Sample Expiration 07/15/2016    Unit Number [id number]    Blood Component Type RBC, LR IRR    Unit division 00    Status of Unit ISSUED    Transfusion Status OK TO TRANSFUSE    Crossmatch Result Compatible    Unit Number [id number]    Blood  Component Type RBC, LR IRR    Unit division 00    Status of Unit ISSUED,FINAL    Transfusion Status OK TO TRANSFUSE    Crossmatch Result Compatible    Unit Number [id number]    Blood Component Type RCLI PHER 1    Unit division 00    Status of Unit ISSUED    Transfusion Status OK TO TRANSFUSE    Crossmatch Result Compatible    Unit Number [id number]    Blood Component Type RCLI PHER 1    Unit division 00    Status of Unit ALLOCATED    Transfusion Status OK TO TRANSFUSE    Crossmatch Result Compatible   Prepare RBC     Status: None   Collection Time: 07/12/16  8:23 PM  Result Value Ref Range   Order Confirmation ORDER PROCESSED BY BLOOD BANK   Glucose, capillary     Status: Abnormal   Collection Time: 07/12/16  9:44 PM  Result Value Ref Range   Glucose-Capillary 102 (H) 65 - 99 mg/dL  MRSA PCR Screening     Status: None   Collection Time: 07/12/16  9:46 PM  Result Value Ref Range   MRSA by PCR NEGATIVE NEGATIVE    Comment:        The GeneXpert MRSA Assay (FDA approved for NASAL specimens only), is one component of a comprehensive MRSA colonization surveillance program. It is not intended to diagnose MRSA infection nor to guide or monitor treatment for MRSA infections.   Glucose, capillary     Status: None   Collection Time: 07/12/16  9:46 PM  Result Value  Ref Range   Glucose-Capillary 95 65 - 99 mg/dL  CBC     Status: Abnormal   Collection Time: 07/13/16  6:40 AM  Result Value Ref Range   WBC 11.1 (H) 3.8 - 10.6 K/uL   RBC 2.22 (L) 4.40 - 5.90 MIL/uL   Hemoglobin 6.3 (L) 13.0 - 18.0 g/dL   HCT 18.6 (L) 40.0 - 52.0 %   MCV 83.9 80.0 - 100.0 fL   MCH 28.4 26.0 - 34.0 pg   MCHC 33.9 32.0 - 36.0 g/dL   RDW 16.7 (H) 11.5 - 14.5 %   Platelets 124 (L) 150 - 440 K/uL  Prepare RBC     Status: None   Collection Time: 07/13/16 11:00 AM  Result Value Ref Range   Order Confirmation DUPLICATE    Nm Gi Blood Loss  Result Date: 07/11/2016 CLINICAL DATA:  GI bleeding  EXAM: NUCLEAR MEDICINE GASTROINTESTINAL BLEEDING SCAN TECHNIQUE: Sequential abdominal images were obtained following intravenous administration of Tc-50mlabeled red blood cells. RADIOPHARMACEUTICALS:  19.80 mCi Tc-967mn-vitro labeled autologous red cells. COMPARISON:  CT abdomen and pelvis 06/25/2016 FINDINGS: At 13 minutes into the exam, an abnormal focus of labeled red cell accumulation begins to identify in the RIGHT lateral abdomen. Over the course of the first hour, tracer passes through a series of curvilinear bowel loops in the RIGHT mid abdomen. The pattern of tracer movement fails to follow the expected course of the colon as demonstrated on CT. Findings are consistent with an active source of GI bleeding within small bowel in the RIGHT mid abdomen. IMPRESSION: Abnormal exam demonstrating presence of an active GI bleed in the lateral RIGHT mid abdomen coursing through multiple curvilinear bowel loops in the RIGHT mid abdomen favoring a small bowel source of bleeding. Electronically Signed   By: MaLavonia Dana.D.   On: 07/11/2016 16:38    Assessment:  JoJOSHAWA DUBINs a [age].o. male with stage IVBE diffuse large B cell lymphoma. He has a post-transplant lymphoproliferative disorder (PTLD).  He is s/p 3 cycles of mini-RCHOP (last 06/20/2016).  Abdomen and pelvic CT scan on 06/25/2016 revealed decrease in retroperitoneal and mesenteric adenopathy.  He was admitted with GI bleeding.  EGD on 07/09/2016 revealed multiple non-bleeding duodenal ulcer as well as duodenitis.  Tagged RBC scan on 07/11/2016 revealed active GI bleed in the lateral right mid abdomen coursing through multiple curvilinear bowel loops in the right mid abdomen favoring a small bowel source of bleeding (? AVM).  He has received 5 units of PRBCs since 07/07/2016.  He has a history of GI bleeding in 08/2014.  He underwent tagged RBC scan which revealed an active bleed in the hepatic flexure.  He was embolized in VIR.  He is s/p renal  transplant.  He is on tacrolimus (Prograf) and steroids.  Creatinine is 0.94 (CrCl 52 ml/min).  Plan:   1.  Oncology:  Patient is s/p 3 cycles of mini-RCHOP.  Scans reveal improving disease.  He was scheduled to receive cycle #4 on 07/11/2016, but was postponed secondary to current GI bleeding.  2.  Hematology:  Active GI bleeding.  He received 3 units of PRBCs since yesterday after drop in hemoglobin to 5.0.  Hemoglobin this AM is 6.3.  Additional units planned.  Check hemoglobin q 6 hours.  Mild thrombocytopenia secondary to consumption.  Maintain active type and screen.  Transfuse leukopoor and irradiated PRBCS to maintain a hemoglobin > 8.  Platelets transfused yesterday for qualitative platelet disorder likely  secondary to home aspirin use, now discontinued.  Patient received Venofer during this admission for iron deficiency.  Patient received vitamin K yesterday.  INR on admission was 1.23.  3.  Gastroenterology:  Active GI bleeding.  Tagged RBC scan suggests possible small bowel source.  Consideration being made for CTA.   He has received much of his care at [facility] for his renal transplant.  He may be a candidate for embolization if a source can be identified as it was in 2016.  Patient being transferred to [facility] for tertiary level care as may need small bowel procedure.  Patient started octreotide.  4.  Nephrology:  Patient is s/p renal transplant on Prograf and prednisone.  Creatinine is stable (0.94). CrCl 52 ml/min.   5.  Nutrition:  Patient had lost 30 pounds prior to diagnosis.  Since treatment, he has been eating better, but remains malnourished.  Albumen is 2.2.  Agree with nutrition consult.  Patient currently NPO secondary to GI bleeding.  6.  Disposition:  Transfer to [facility] per notes.  7.  Code status:  Full Code.   Lequita Asal, MD  07/13/2016, 12:34 PM

## 2016-07-13 NOTE — Discharge Summary (Signed)
Matthew Brown, is a 76 y.o. male  DOB 1940-07-22  MRN [id number].  Admission date:  07/08/2016  Admitting Physician  Bettey Costa, MD  Discharge Date:  07/13/2016   Primary MD  Enid Derry, MD  Recommendations for primary care physician for things to follow:   Transferred to [facility] level of care   Admission Diagnosis  GIB (gastrointestinal bleeding) [K92.2] Acute upper GI bleed [K92.2]   Discharge Diagnosis  GIB (gastrointestinal bleeding) [K92.2] Acute upper GI bleed [K92.2]    Active Problems:   GIB (gastrointestinal bleeding)   Acute upper GI bleed   Cancer [facility])   Palliative care by specialist   DNR (do not resuscitate) discussion   Goals of care, counseling/discussion      Past Medical History:  Diagnosis Date  . Benign prostatic hypertrophy   . Chronic headache 10/19/2015  . ED (erectile dysfunction)   . End stage renal disease ([place])   . Essential hypertension   . GERD (gastroesophageal reflux disease)   . GIB (gastrointestinal bleeding)    a. [id number] s/p R colic artery embolization;  b. 02/2015 EGD: duod ulcerative mass->Bx notable for coagulative necrosis - ? ischemia vs thrombosis-->coumadin d/c'd.  . Gout   . Hearing loss   . Hemorrhoids   . Hyperlipidemia   . Lymphoma ([place])   . Lymphoma ([place]) 2017  . Multiple thyroid nodules 06/06/2016   Noted on carotid US; dedicated US to be ordered by staff  . Osteoarthrosis, unspecified whether generalized or localized, lower leg   . Persistent atrial fibrillation ([place])    a. CHA2DS2VASc = 3-->coumadin d/c'd 02/2015 2/2 recurrent GIB.  [name] Prostatitis   . Pulmonary hypertension    a. 10/2014 Echo: EF 60-65%, mild to mod MR, mildly dil LA, nl RV, PASP 2mmHg.  [name] Renal transplant recipient   . Ulcers of both great toes [facility])     Past Surgical History:   Procedure Laterality Date  . BACK SURGERY    . ESOPHAGOGASTRODUODENOSCOPY  03/13/15   severe esophagitis, ulcerated mass  . ESOPHAGOGASTRODUODENOSCOPY (EGD) WITH PROPOFOL N/A 07/09/2016   Procedure: ESOPHAGOGASTRODUODENOSCOPY (EGD) WITH PROPOFOL;  Surgeon: [name], MD;  Location: [facility] ENDOSCOPY;  Service: Endoscopy;  Laterality: N/A;  . HERNIA REPAIR  1974  . PERIPHERAL VASCULAR CATHETERIZATION N/A 05/07/2016   Procedure: Glori Luis Cath Insertion;  Surgeon: Algernon Huxley, MD;  Location: [place] CV LAB;  Service: Cardiovascular;  Laterality: N/A;  . PROSTATE ABLATION    . STOMACH SURGERY     blood vessel burst  . THROAT SURGERY    . TOTAL KNEE ARTHROPLASTY         History of present illness and  Hospital Course:     Kindly see H&P for history of present illness and admission details, please review complete Labs, Consult reports and Test reports for all details in brief  HPI  from the history and physical done on the day of admission Matthew Brown  is a 76 y.o. male with a known history of End-stage renal disease status post right kidney transplant, lymphoma on chemotherapy and persistent atrial fibrillation discharge from the hospital January 6 with viral gastroenteritis who presented to the GI clinic for evaluation of anemia. Patient reports that he has epigastric pain with poor appetite. 2 days ago he noted dark colored stools and bright red blood. Hemoglobin and GI clinic 6.8, grossly heme positive. Admitted for GI bleed. Patient's baseline hemoglobin is 9.   Hospital Course   #1.  GI bleeding; admitted for GI bleeding thought to have upper GI bleed, started on PPIs, patient received a initially 2 units of packed RBC, seen by gastroenterology, did have EGD, on 17th of January. patient EGD showed nonbleeding duodenal ulcers, but patient continued to have drop in hemoglobin with black stools, requiring Tagged  RBC scan Patient's tagged RBC scan on 19th of January showed abnormal exam  ,presence of an active GI bleed in the lateral RIGHT mid abdomen coursing through multiple curvilinearbowel loops in the RIGHT mid abdomen favoring a small bowel sourceof bleeding. Seen by surgery,, followed by gastroenterology. Patient developed hypotension and episode of break black stool last night. Hemoglobin dropped to 7.5 to 5 on same day. Her to hemorrhagic shock transferred the patient to ICU and required fluid support, patient received 2 units of packed RBCs last night, hemoglobin improved from 5.-6.3. Seen by gastroenterology again today started on octreotide drip, recommended 2 more units of packed RBC transfusion, spoke with gastroenterology, surgery,  still don't know the source of small bowel bleeding, vascular surgery recommended CT angiogram which will be done but because patient has multiple medical problems B-cell lymphoma on mini R CHOP therapy, kidney transplant on immunosuppressants, thrombocytopenia and high risk for bleeding patient needs higher level of care at [facility] for possible deep enteroscopy. Has A port and IV access in left arm, spoke with [facility] medical ICU team. Because of ongoing the GI bleed with the anemia requiring multiple units of transfusion, hemorrhagic shock requiring ICU transfer last night patient is better served at medical ICU at [facility].  He  received multiple units of blood transfusion during this admission. 2.Apparent small bowel bleed.   Per GI, consideration for video capsule study then possible deep small bowel endoscopy.  Per surgery, patient would be high risk for surgery.  He has received much of his care at [facility] for his renal transplant,   3.Stage IV diffuse B cell lymphoma currently undergoing RCHOP therapy,He was scheduled to receive cycle #4 on 07/11/2016, but was postponed secondary to current GI bleeding.,   .4. Persistent atrial fibrillation Coumadin has been discontinued due to GI bleed  5. History of renal transplant: Continue Prograf and  prednisone  6, BPH on tamsulosin and finasteride  87. Protein calorie malnutrition:    Discharge Condition: stable   Follow UP      Discharge Instructions  and  Discharge Medications  Continue Octreotide drip.    Allergies as of 07/13/2016   No Known Allergies     Medication List    STOP taking these medications   furosemide 80 MG tablet Commonly known as:  LASIX   lactulose 10 GM/15ML solution Commonly known as:  CHRONULAC   prochlorperazine 10 MG tablet Commonly known as:  COMPAZINE     TAKE these medications   acetaminophen 325 MG tablet Commonly known as:  TYLENOL Take 2 tablets (650 mg total) by mouth every 6 (six) hours as needed for mild pain (or Fever >/= 101).   allopurinol 100 MG tablet Commonly known as:  ZYLOPRIM Take 100 mg by mouth daily.   COLCRYS 0.6 MG tablet Generic drug:  colchicine Take 1 tablet by mouth 2 (two) times daily as needed.   diphenhydrAMINE 25 mg capsule Commonly known as:  BENADRYL Take 1 capsule (25 mg total) by mouth at bedtime as needed for sleep.   feeding supplement (ENSURE ENLIVE) Liqd Take 237 mLs by mouth 3 (three) times daily between meals.   finasteride 5 MG tablet Commonly  known as:  PROSCAR Take 5 mg by mouth daily.   FLOMAX 0.4 MG Caps capsule Generic drug:  tamsulosin Take 0.4 mg by mouth daily.   HYDROcodone-acetaminophen 5-325 MG tablet Commonly known as:  NORCO Take 1 tablet by mouth every 4 (four) hours as needed for moderate pain.   hydroxypropyl methylcellulose 2.5 % ophthalmic solution Commonly known as:  ISOPTO TEARS Place 1 drop into both eyes as needed.   multivitamin tablet Take 1 tablet by mouth daily.   ondansetron 4 MG tablet Commonly known as:  ZOFRAN Take 1 tablet (4 mg total) by mouth every 6 (six) hours as needed for nausea.   oxybutynin 5 MG 24 hr tablet Commonly known as:  DITROPAN-XL Take 5 mg by mouth daily.   oxyCODONE-acetaminophen 5-325 MG tablet Commonly known  as:  ROXICET Take 0.5-1 tablets by mouth every 6 (six) hours as needed for severe pain.   pantoprazole 40 MG tablet Commonly known as:  PROTONIX Take 1 tablet (40 mg total) by mouth 2 (two) times daily.   predniSONE 5 MG tablet Commonly known as:  DELTASONE Take 5 mg by mouth daily. What changed:  Another medication with the same name was removed. Continue taking this medication, and follow the directions you see here.   PROAIR HFA 108 (90 Base) MCG/ACT inhaler Generic drug:  albuterol Inhale 1-2 puffs into the lungs every 4 (four) hours as needed.   simethicone 80 MG chewable tablet Commonly known as:  MYLICON Chew 1 tablet (80 mg total) by mouth every 6 (six) hours as needed for flatulence.   sucralfate 1 g tablet Commonly known as:  CARAFATE Take 1 tablet (1 g total) by mouth 4 (four) times daily. Resume taking after one week- once finished taking oral levaquine. ( to avoid interaction.)   tacrolimus 1 MG capsule Commonly known as:  PROGRAF Take 3 mg by mouth 2 (two) times daily. Reported on 08/16/2015         Diet and Activity recommendation: See Discharge Instructions above   Consults obtained - gastroenterlogy, oncology, surgery, vascular surgery, palliative care team   Major procedures and Radiology Reports - PLEASE review detailed and final reports for all details, in brief -     Ct Abdomen Pelvis Wo Contrast  Result Date: 06/25/2016 CLINICAL DATA:  Lymphoma on chemotherapy. Treated 1 week ago now complaining of lower abdominal pain with diarrhea, nausea and vomiting. EXAM: CT ABDOMEN AND PELVIS WITHOUT CONTRAST TECHNIQUE: Multidetector CT imaging of the abdomen and pelvis was performed following the standard protocol without IV contrast. COMPARISON:  06/13/2016 CT lumbar spine, PET-CT 04/28/2016 FINDINGS: Lower chest: Cardiomegaly with coronary arteriosclerosis. Port catheter tip in the cavoatrial junction. Small bilateral pleural effusions right greater than left  with adjacent atelectasis. Minimal lingular atelectasis. Thoracic aortic atherosclerosis. Hepatobiliary: The unenhanced liver demonstrates no focal mass. Punctate calcification in the right hepatic lobe possibly a small granuloma or vascular calcification. Gallbladder does not appear to be distended. No gallstones are seen. Pancreas: Atrophic pancreas. Spleen: No splenomegaly. Adrenals/Urinary Tract: Normal appearing bilateral adrenal glands. Atrophic bilateral kidneys with renovascular calcifications bilaterally. Small cortical and exophytic lesions are noted on the right, measuring between 13 and 16 mm. The exophytic lesion is not simple by CT may represent a complex or proteinaceous cyst. Further characterization is not possible given lack of IV contrast. Pelvic right kidney is noted. Stomach/Bowel: Contrast distended slightly thickened stomach with contrast filled mildly thickened proximal small bowel loops consistent with gastroenteritis. Oral contrast reaches large  bowel. Normal-appearing appendix. Descending colonic and sigmoid diverticulosis without acute diverticulitis. Vascular/Lymphatic: The mesenteric and retroperitoneal periaortic adenopathy appears to have slightly decreased in size since prior PET-CT from 04/28/2016. Right para-aortic lymph node measured at the same level previously now measures 1.6 cm in thickness versus 3.2 cm previously. Left para- aortic adenopathy now measures approximately 3.8 cm versus 5 cm previously. Mesenteric adenopathy now measures 1.7 cm versus 2.5 cm. Reproductive: Normal sized partially calcified prostate. Left inguinal surgical clips are present. Other: No free air or free fluid. Musculoskeletal: Solid osseous fusion of the posterior elements from L3 through S1 with cerclage wires in place. Degenerative disc disease with height loss from L1 through L3. Subtle heterogeneity of the L3 through L5 vertebral bodies as previously described which cannot exclude lytic disease.  Osteoarthritis of hips with subchondral cysts noted about both hips. Lytic lesion of the left sacrum with soft tissue component. IMPRESSION: Fluid-filled distended and slightly thickened appearance of the stomach and proximal small bowel consistent with gastroenteritis. No bowel obstruction noted. Interval decrease in size of retroperitoneal and mesenteric lymphadenopathy. Small bilateral pleural effusions right greater than left. Right transplanted pelvic kidney with atrophic bilateral native kidneys. Small stable right-sided renal cysts. Lytic lucencies of L3, L4 and L5 and the left sacrum consistent osteolytic metastatic disease. Electronically Signed   By: Ashley Royalty M.D.   On: 06/25/2016 21:55   Ct Lumbar Spine Wo Contrast  Result Date: 06/13/2016 CLINICAL DATA:  [place].  Lumbar spine surgery. EXAM: CT LUMBAR SPINE WITHOUT CONTRAST TECHNIQUE: Multidetector CT imaging of the lumbar spine was performed without intravenous contrast administration. Multiplanar CT image reconstructions were also generated. COMPARISON:  None. FINDINGS: Segmentation: 5 lumbar type vertebrae. Alignment: No static listhesis. Levocurvature of the thoracolumbar spine. Vertebrae: No acute fracture. Relative heterogeneity in the L4 and L5 vertebral bodies concerning for malignancy when correlated with recent PET-CT dated 04/30/2016. Mild degenerative changes of bilateral sacroiliac joints. Paraspinal and other soft tissues: Abdominal aortic atherosclerosis. Bilateral renal atrophy. Large left periaortic soft tissue mass likely reflecting abnormal lymphoid tissue measuring approximately 5.3 x 4 cm in transverse dimension consistent with patient's history of lymphoma. Spinal cord is suboptimally evaluated secondary to poor resolution on CT. Disc levels: Degenerative disc disease with disc height loss at L2-3. Mild disc calcification at L3-4, L4-5 and L5-S1. Solid osseous fusion of the posterior elements from L3 through S1 with  cerclage wires in place. From L3 through S1 there is solid osseous fusion with no interspinous spaces to perform a lumbar puncture. Bilateral facet arthropathy at L1-2 and L2-3. IMPRESSION: 1. Relative heterogeneity in the L4 and L5 vertebral bodies concerning for malignancy when correlated with recent PET-CT dated 04/30/2016. 2. Large left periaortic soft tissue mass likely reflecting abnormal lymphoid tissue measuring approximately 5.3 x 4 cm in transverse dimension consistent with patient's history of lymphoma. 3. Solid osseous fusion of the posterior elements from L3 through S1 with cerclage wires in place. From L3 through S1 there is solid osseous fusion with no interspinous spaces to perform a lumbar puncture. Electronically Signed   By: Kathreen Devoid   On: 06/13/2016 14:02   Nm Gi Blood Loss  Result Date: 07/11/2016 CLINICAL DATA:  GI bleeding EXAM: NUCLEAR MEDICINE GASTROINTESTINAL BLEEDING SCAN TECHNIQUE: Sequential abdominal images were obtained following intravenous administration of Tc-89m labeled red blood cells. RADIOPHARMACEUTICALS:  19.80 mCi Tc-22m in-vitro labeled autologous red cells. COMPARISON:  CT abdomen and pelvis 06/25/2016 FINDINGS: At 13 minutes into the exam, an abnormal focus  of labeled red cell accumulation begins to identify in the RIGHT lateral abdomen. Over the course of the first hour, tracer passes through a series of curvilinear bowel loops in the RIGHT mid abdomen. The pattern of tracer movement fails to follow the expected course of the colon as demonstrated on CT. Findings are consistent with an active source of GI bleeding within small bowel in the RIGHT mid abdomen. IMPRESSION: Abnormal exam demonstrating presence of an active GI bleed in the lateral RIGHT mid abdomen coursing through multiple curvilinear bowel loops in the RIGHT mid abdomen favoring a small bowel source of bleeding. Electronically Signed   By: Lavonia Dana M.D.   On: 07/11/2016 16:38   Dg Chest Portable  1 View  Result Date: 07/08/2016 CLINICAL DATA:  Epigastric pain. EXAM: PORTABLE CHEST 1 VIEW COMPARISON:  05/21/2016. FINDINGS: PowerPort catheter noted with lead tip projected over the right atrium. Cardiomegaly with mild pulmonary vascular prominence and bilateral interstitial prominence. A mild component congestive heart failure cannot be excluded. Findings have improved from prior study of 05/21/2016. IMPRESSION: 1. PowerPort catheter noted in stable position. 2. Cardiomegaly with mild bilateral from interstitial prominence. A very mild component congestive heart failure cannot be excluded. Findings are significantly improved from prior study of 05/21/2016. Electronically Signed   By: Marcello Moores  Register   On: 07/08/2016 14:42   Dg Cyndy Freeze Guided Loc Of Needle/cath Tip For Spinal Inject Lt  Result Date: 06/19/2016 CLINICAL DATA:  Diffuse B-cell lymphoma. Diagnostic lumbar puncture for lymphoma evaluation. EXAM: DIAGNOSTIC LUMBAR PUNCTURE UNDER FLUOROSCOPIC GUIDANCE FLUOROSCOPY TIME:  Fluoroscopy Time:  0.3 minutes Radiation Exposure Index (if provided by the fluoroscopic device): 5.3 mGy Number of Acquired Spot Images: 0 PROCEDURE: Informed consent was obtained from the patient prior to the procedure, including potential complications of headache, allergy, and pain was obtained by Dr. Cari Caraway. With the patient prone, the lower back was prepped with Betadine. 1% Lidocaine was used for local anesthesia. Lumbar puncture was performed at the L2-3 level using a 22 gauge needle with return of clear CSF. 14 ml of CSF were obtained for laboratory studies. The patient tolerated the procedure well and there were no apparent complications. IMPRESSION: Successful fluoroscopic guided lumbar puncture by Dr. Cari Caraway. Electronically Signed   By: Kathreen Devoid   On: 06/19/2016 12:53    Micro Results    Recent Results (from the past 240 hour(s))  MRSA PCR Screening     Status: None   Collection Time: 07/12/16   9:46 PM  Result Value Ref Range Status   MRSA by PCR NEGATIVE NEGATIVE Final    Comment:        The GeneXpert MRSA Assay (FDA approved for NASAL specimens only), is one component of a comprehensive MRSA colonization surveillance program. It is not intended to diagnose MRSA infection nor to guide or monitor treatment for MRSA infections.        Today   Subjective:   Matthew Brown today ,stable transfer to [facility].  Blood pressure (!) 121/98, pulse 71, temperature 97.4 F (36.3 C), temperature source Axillary, resp. rate 20, height 5\' 6"  (1.676 m), weight 54.8 kg (120 lb 13 oz), SpO2 97 %.   Intake/Output Summary (Last 24 hours) at 07/13/16 1123 Last data filed at 07/13/16 1030  Gross per 24 hour  Intake          5367.08 ml  Output                0 ml  Net  5367.08 ml    Exam Awake Alert, Oriented x 3, No new F.N deficits, Normal affect.he is cachectic.  [place].AT,PERRAL Supple Neck,No JVD, No cervical lymphadenopathy appriciated.  Symmetrical Chest wall movement, Good air movement bilaterally, CTAB RRR,No Gallops,Rubs or new Murmurs, No Parasternal Heave +ve B.Sounds, Abd Soft, Non tender, No organomegaly appriciated, No rebound -guarding or rigidity. No Cyanosis, Clubbing or edema, No new Rash or bruise  Data Review   CBC w Diff:  Lab Results  Component Value Date   WBC 11.1 (H) 07/13/2016   HGB 6.3 (L) 07/13/2016   HCT 18.6 (L) 07/13/2016   PLT 124 (L) 07/13/2016   LYMPHOPCT 10 07/12/2016   BANDSPCT 13 05/22/2016   MONOPCT 7 07/12/2016   EOSPCT 0 07/12/2016   BASOPCT 0 07/12/2016    CMP:  Lab Results  Component Value Date   NA 138 07/12/2016   K 4.3 07/12/2016   CL 110 07/12/2016   CO2 27 07/12/2016   BUN 32 (H) 07/12/2016   CREATININE 0.94 07/12/2016   PROT 3.9 (L) 07/12/2016   ALBUMIN 2.2 (L) 07/12/2016   BILITOT 0.5 07/12/2016   ALKPHOS 44 07/12/2016   AST 12 (L) 07/12/2016   ALT 8 (L) 07/12/2016  .   Total Time in preparing paper work,  data evaluation and todays exam - 20 minutes  Charmelle Soh M.D on 07/13/2016 at 11:23 AM    Note: This dictation was prepared with Dragon dictation along with smaller phrase technology. Any transcriptional errors that result from this process are unintentional.

## 2016-07-13 NOTE — Consult Note (Signed)
GI Inpatient Follow-up Note  Patient Identification: Matthew Brown is a 76 y.o. male GI bleed with RBC tagged scan showed distal SB active bleed.  HGB dropped to 5.0 yesterday evening. Admitted to ICU for additional transfusion and support HGB this am 6.3. No further bloody BM's this am.  . Subjective:  Scheduled Inpatient Medications:  . sodium chloride   Intravenous Once  . allopurinol  100 mg Oral Daily  . feeding supplement (ENSURE ENLIVE)  237 mL Oral TID BM  . finasteride  5 mg Oral Daily  . oxybutynin  5 mg Oral Daily  . pantoprazole  40 mg Intravenous Q12H  . polyethylene glycol-electrolytes  4,000 mL Oral Once  . predniSONE  5 mg Oral Daily  . sterile water (preservative free)      . sucralfate  1 g Oral TID WC & HS  . tacrolimus  3 mg Oral BID  . tamsulosin  0.4 mg Oral Daily    Continuous Inpatient Infusions:   . sodium chloride Stopped (07/12/16 0200)  . sodium chloride 100 mL/hr at 07/13/16 0643  . octreotide  (SANDOSTATIN)    IV infusion 50 mcg/hr (07/13/16 0354)    PRN Inpatient Medications:  acetaminophen **OR** acetaminophen, morphine injection, ondansetron **OR** ondansetron (ZOFRAN) IV, oxyCODONE, senna-docusate  Review of Systems:  Constitutional: Weight is stable.  Eyes: No changes in vision. ENT: No oral lesions, sore throat.  GI: see HPI.  Heme/Lymph: No easy bruising.  CV: No chest pain.  GU: No hematuria.  Integumentary: No rashes.  Neuro: No headaches.  Psych: No depression/anxiety.  Endocrine: No heat/cold intolerance.  Allergic/Immunologic: No urticaria.  Resp: No cough, SOB.  Musculoskeletal: No joint swelling.    Physical Examination: BP (!) 124/53   Pulse 69   Temp 97.8 F (36.6 C) (Oral)   Resp (!) 24   Ht 5\' 6"  (1.676 m)   Wt 54.8 kg (120 lb 13 oz)   SpO2 99%   BMI 19.50 kg/m  Gen: NAD, alert and oriented x 4 HEENT: PEERLA, EOMI, Neck: supple, no JVD or thyromegaly Chest: CTA bilaterally, no wheezes, crackles, or other  adventitious sounds CV: RRR, no m/g/c/r Abd: soft, NT, ND, +BS in all four quadrants; no HSM, guarding, ridigity, or rebound tenderness Ext: no edema Skin: no rash or lesions noted   Data: Lab Results  Component Value Date   WBC 11.1 (H) 07/13/2016   HGB 6.3 (L) 07/13/2016   HCT 18.6 (L) 07/13/2016   MCV 83.9 07/13/2016   PLT 124 (L) 07/13/2016    Recent Labs Lab 07/12/16 0826 07/12/16 1922 07/13/16 0640  HGB 7.0* 5.0* 6.3*   Lab Results  Component Value Date   NA 138 07/12/2016   K 4.3 07/12/2016   CL 110 07/12/2016   CO2 27 07/12/2016   BUN 32 (H) 07/12/2016   CREATININE 0.94 07/12/2016   Lab Results  Component Value Date   ALT 8 (L) 07/12/2016   AST 12 (L) 07/12/2016   ALKPHOS 44 07/12/2016   BILITOT 0.5 07/12/2016    Recent Labs Lab 07/12/16 0826 07/12/16 2023  APTT  --  33  INR 1.23  --     Assessment/Plan: Matthew Brown is a 76 y.o. male active GI bleed. Most likely SB which continues.   Recommendations: Discussed with vascular radiologist Dr. Lorenso Courier and [place]. Still unclear if SB source. Agreed to proceed with CT angiogram to confirm bleed is small bowel and not cecal.  If SB is the  bleeding  source will need to be transferred to [facility] ASAP for deep enteroscopy.   Continue to transfuse and keep HGB above 8.0   Discussed case with Dr. Vianne Bulls.  Please call with questions or concerns.  [place], MD

## 2016-07-13 NOTE — Consult Note (Signed)
Reason for Consult: GI Bleed Referring Physician: Dr. Geni Bers is an 76 y.o. male.  HPI: Patient with stage 4 lymphoma and history of GI bleeds.He underwent EGD on 07/09/2016.  There were multiple non-bleeding duodenal ulcer as well as duodenitis.  He underwent tagged RBC scan on 07/11/2016.  Imaging revealed active GI bleed in the lateral right mid abdomen coursing through multiple curvilinear bowel loops in the right mid abdomen. He was stable and then had another bleed requiring transfusion and ICU care. Per General surgery he is not a candidate for operative intervention.  Past Medical History:  Diagnosis Date  . Benign prostatic hypertrophy   . Chronic headache 10/19/2015  . ED (erectile dysfunction)   . End stage renal disease ([place])   . Essential hypertension   . GERD (gastroesophageal reflux disease)   . GIB (gastrointestinal bleeding)    a. 01/997 s/p R colic artery embolization;  b. 02/2015 EGD: duod ulcerative mass->Bx notable for coagulative necrosis - ? ischemia vs thrombosis-->coumadin d/c'd.  . Gout   . Hearing loss   . Hemorrhoids   . Hyperlipidemia   . Lymphoma ([place])   . Lymphoma ([place]) 2017  . Multiple thyroid nodules 06/06/2016   Noted on carotid US; dedicated US to be ordered by staff  . Osteoarthrosis, unspecified whether generalized or localized, lower leg   . Persistent atrial fibrillation ([place])    a. CHA2DS2VASc = 3-->coumadin d/c'd 02/2015 2/2 recurrent GIB.  [name] Prostatitis   . Pulmonary hypertension    a. 10/2014 Echo: EF 60-65%, mild to mod MR, mildly dil LA, nl RV, PASP 71mHg.  .Marland KitchenRenal transplant recipient   . Ulcers of both great toes ([facility]     Past Surgical History:  Procedure Laterality Date  . BACK SURGERY    . ESOPHAGOGASTRODUODENOSCOPY  03/13/15   severe esophagitis, ulcerated mass  . ESOPHAGOGASTRODUODENOSCOPY (EGD) WITH PROPOFOL N/A 07/09/2016   Procedure: ESOPHAGOGASTRODUODENOSCOPY (EGD) WITH PROPOFOL;  Surgeon: KJonathon Bellows MD;   Location: [facility] ENDOSCOPY;  Service: Endoscopy;  Laterality: N/A;  . HERNIA REPAIR  1974  . PERIPHERAL VASCULAR CATHETERIZATION N/A 05/07/2016   Procedure: PGlori LuisCath Insertion;  Surgeon: JAlgernon Huxley MD;  [facility];  Service: Cardiovascular;  Laterality: N/A;  . PROSTATE ABLATION    . STOMACH SURGERY     blood vessel burst  . THROAT SURGERY    . TOTAL KNEE ARTHROPLASTY      Family History  Problem Relation Age of Onset  . Cancer Mother     throat  . Diabetes Brother   . Heart disease Brother   . Stroke Brother   . Hypertension Brother   . Diabetes Sister   . Heart disease Sister   . Hypertension Sister   . Diabetes Sister   . Diabetes Brother   . COPD Neg Hx   . Kidney disease Neg Hx   . Prostate cancer Neg Hx     Social History:  reports that he quit smoking about 38 years ago. His smoking use included Cigarettes. He has a 25.00 pack-year smoking history. He has never used smokeless tobacco. He reports that he does not drink alcohol or use drugs.  Allergies: No Known Allergies  Medications: I have reviewed the patient's current medications.  Results for orders placed or performed during the hospital encounter of 07/08/16 (from the past 48 hour(s))  Prepare RBC     Status: None   Collection Time: 07/11/16  9:52 AM  Result  Value Ref Range   Order Confirmation ORDER PROCESSED BY BLOOD BANK   Platelet function assay     Status: Abnormal   Collection Time: 07/11/16  9:53 PM  Result Value Ref Range   Collagen / ADP 190 (H) 0 - 118 seconds   PFA Interpretation            Comment: Platelet function is abnormal. This pattern is most commonly seen in patients with von Willebrand disease or congenital platelet defects.  Prolongation of the Col/ADP closure time generally indicate a more extensive qualitative platelet defect and/or abnormality in von Willebrand factor.        Results of the test should always be interpreted in conjunction with  the patient's medical history, clinical presentation and medication history. Patients with Hematocrit values <35.0% or Platelet counts <150,000/uL may result in values above the Laboratory established reference range.    Collagen / Epinephrine 211 (H) 0 - 193 seconds    Comment: Performed at [facility], [address] [place], [place] [address]  Platelet count     Status: Abnormal   Collection Time: 07/11/16  9:53 PM  Result Value Ref Range   Platelets 117 (L) 150 - 440 K/uL  Hemoglobin     Status: Abnormal   Collection Time: 07/12/16  5:04 AM  Result Value Ref Range   Hemoglobin 7.5 (L) 13.0 - 18.0 g/dL  Protime-INR     Status: Abnormal   Collection Time: 07/12/16  8:26 AM  Result Value Ref Range   Prothrombin Time 15.6 (H) 11.4 - 15.2 seconds   INR 1.23   CBC     Status: Abnormal   Collection Time: 07/12/16  8:26 AM  Result Value Ref Range   WBC 9.2 3.8 - 10.6 K/uL   RBC 2.45 (L) 4.40 - 5.90 MIL/uL   Hemoglobin 7.0 (L) 13.0 - 18.0 g/dL   HCT 20.9 (L) 40.0 - 52.0 %   MCV 85.3 80.0 - 100.0 fL   MCH 28.6 26.0 - 34.0 pg   MCHC 33.6 32.0 - 36.0 g/dL   RDW 21.8 (H) 11.5 - 14.5 %   Platelets 112 (L) 150 - 440 K/uL  Comprehensive metabolic panel     Status: Abnormal   Collection Time: 07/12/16  8:26 AM  Result Value Ref Range   Sodium 138 135 - 145 mmol/L    Comment: RESULT REPEATED AND VERIFIED   Potassium 4.3 3.5 - 5.1 mmol/L   Chloride 110 101 - 111 mmol/L   CO2 27 22 - 32 mmol/L   Glucose, Bld 102 (H) 65 - 99 mg/dL   BUN 32 (H) 6 - 20 mg/dL   Creatinine, Ser 0.94 0.61 - 1.24 mg/dL   Calcium 8.8 (L) 8.9 - 10.3 mg/dL   Total Protein 3.9 (L) 6.5 - 8.1 g/dL   Albumin 2.2 (L) 3.5 - 5.0 g/dL   AST 12 (L) 15 - 41 U/L   ALT 8 (L) 17 - 63 U/L   Alkaline Phosphatase 44 38 - 126 U/L   Total Bilirubin 0.5 0.3 - 1.2 mg/dL   GFR calc non Af Amer >60 >60 mL/min   GFR calc Af Amer >60 >60 mL/min    Comment: (NOTE) The eGFR has been calculated using the CKD EPI equation. This  calculation has not been validated in all clinical situations. eGFR's persistently <60 mL/min signify possible Chronic Kidney Disease.    Anion gap 1 (L) 5 - 15  Prepare Pheresed Platelets  Status: None (Preliminary result)   Collection Time: 07/12/16  9:30 AM  Result Value Ref Range   ISSUE DATE / TIME 174944967591    Blood Product Unit Number [id number]    PRODUCT CODE [id number]    Unit Type and Rh 5100    Blood Product Expiration Date 201801222359    ISSUE DATE / TIME 009233007622    Blood Product Unit Number [id number]    PRODUCT CODE [id number]    Unit Type and Rh 6200    Blood Product Expiration Date 681157262035   CBC with Differential/Platelet     Status: Abnormal   Collection Time: 07/12/16  7:22 PM  Result Value Ref Range   WBC 17.4 (H) 3.8 - 10.6 K/uL   RBC 1.73 (L) 4.40 - 5.90 MIL/uL   Hemoglobin 5.0 (L) 13.0 - 18.0 g/dL    Comment: RESULT REPEATED AND VERIFIED   HCT 15.0 (L) 40.0 - 52.0 %   MCV 87.1 80.0 - 100.0 fL   MCH 28.9 26.0 - 34.0 pg   MCHC 33.1 32.0 - 36.0 g/dL   RDW 22.1 (H) 11.5 - 14.5 %   Platelets 227 150 - 440 K/uL    Comment: RESULT REPEATED AND VERIFIED   Neutrophils Relative % 83 %   Lymphocytes Relative 10 %   Monocytes Relative 7 %   Eosinophils Relative 0 %   Basophils Relative 0 %   Neutro Abs 14.5 (H) 1.4 - 6.5 K/uL   Lymphs Abs 1.7 1.0 - 3.6 K/uL   Monocytes Absolute 1.2 (H) 0.2 - 1.0 K/uL   Eosinophils Absolute 0.0 0 - 0.7 K/uL   Basophils Absolute 0.0 0 - 0.1 K/uL   RBC Morphology POLYCHROMASIA PRESENT     Comment: MIXED RBC POPULATION  APTT     Status: None   Collection Time: 07/12/16  8:23 PM  Result Value Ref Range   aPTT 33 24 - 36 seconds  Fibrinogen     Status: Abnormal   Collection Time: 07/12/16  8:23 PM  Result Value Ref Range   Fibrinogen 183 (L) 210 - 475 mg/dL  Type and screen [facility]     Status: None (Preliminary result)   Collection Time: 07/12/16  8:23 PM  Result Value Ref Range    ISSUE DATE / TIME 597416384536    Blood Product Unit Number [id number]    PRODUCT CODE [id number]    Unit Type and Rh 5100    Blood Product Expiration Date 003704888916    ISSUE DATE / TIME 945038882800    Blood Product Unit Number [id number]    PRODUCT CODE E0332V00    Unit Type and Rh 5100    Blood Product Expiration Date 948016553748    Blood Product Unit Number [id number]    Unit Type and Rh 5100    Blood Product Expiration Date 100712197588    Blood Product Unit Number [id number]    Unit Type and Rh 5100    Blood Product Expiration Date 094076808811   Prepare RBC     Status: None   Collection Time: 07/12/16  8:23 PM  Result Value Ref Range   Order Confirmation ORDER PROCESSED BY BLOOD BANK   Glucose, capillary     Status: Abnormal   Collection Time: 07/12/16  9:44 PM  Result Value Ref Range   Glucose-Capillary 102 (H) 65 - 99 mg/dL  MRSA PCR Screening     Status: None   Collection Time: 07/12/16  9:46 PM  Result Value Ref Range  MRSA by PCR NEGATIVE NEGATIVE    Comment:        The GeneXpert MRSA Assay (FDA approved for NASAL specimens only), is one component of a comprehensive MRSA colonization surveillance program. It is not intended to diagnose MRSA infection nor to guide or monitor treatment for MRSA infections.   Glucose, capillary     Status: None   Collection Time: 07/12/16  9:46 PM  Result Value Ref Range   Glucose-Capillary 95 65 - 99 mg/dL  CBC     Status: Abnormal   Collection Time: 07/13/16  6:40 AM  Result Value Ref Range   WBC 11.1 (H) 3.8 - 10.6 K/uL   RBC 2.22 (L) 4.40 - 5.90 MIL/uL   Hemoglobin 6.3 (L) 13.0 - 18.0 g/dL   HCT 18.6 (L) 40.0 - 52.0 %   MCV 83.9 80.0 - 100.0 fL   MCH 28.4 26.0 - 34.0 pg   MCHC 33.9 32.0 - 36.0 g/dL   RDW 16.7 (H) 11.5 - 14.5 %   Platelets 124 (L) 150 - 440 K/uL    Nm Gi Blood Loss  Result Date: 07/11/2016 CLINICAL DATA:  GI bleeding EXAM: NUCLEAR MEDICINE GASTROINTESTINAL BLEEDING SCAN TECHNIQUE:  Sequential abdominal images were obtained following intravenous administration of Tc-64mlabeled red blood cells. RADIOPHARMACEUTICALS:  19.80 mCi Tc-937mn-vitro labeled autologous red cells. COMPARISON:  CT abdomen and pelvis 06/25/2016 FINDINGS: At 13 minutes into the exam, an abnormal focus of labeled red cell accumulation begins to identify in the RIGHT lateral abdomen. Over the course of the first hour, tracer passes through a series of curvilinear bowel loops in the RIGHT mid abdomen. The pattern of tracer movement fails to follow the expected course of the colon as demonstrated on CT. Findings are consistent with an active source of GI bleeding within small bowel in the RIGHT mid abdomen. IMPRESSION: Abnormal exam demonstrating presence of an active GI bleed in the lateral RIGHT mid abdomen coursing through multiple curvilinear bowel loops in the RIGHT mid abdomen favoring a small bowel source of bleeding. Electronically Signed   By: MaLavonia Dana.D.   On: 07/11/2016 16:38    Review of Systems  Constitutional: Positive for malaise/fatigue.  HENT: Negative.   Eyes: Negative.   Respiratory: Negative for cough and shortness of breath.   Cardiovascular: Negative for chest pain and palpitations.  Gastrointestinal: Positive for abdominal pain, blood in stool and melena.  Genitourinary: Negative.   Musculoskeletal: Negative.   Skin: Negative.   Neurological: Positive for weakness.  Endo/Heme/Allergies: Negative.    Blood pressure (!) 124/53, pulse 70, temperature 98.6 F (37 C), temperature source Oral, resp. rate (!) 28, height 5' 6"  (1.676 m), weight 54.8 kg (120 lb 13 oz), SpO2 100 %. Physical Exam  Nursing note and vitals reviewed. Constitutional: He is oriented to person, place, and time.  Cardiovascular: Normal rate and regular rhythm.   Respiratory: Effort normal and breath sounds normal.  GI: Soft. He exhibits no distension. There is no tenderness. There is no rebound and no guarding.   Musculoskeletal: Normal range of motion.  Neurological: He is alert and oriented to person, place, and time.  Skin: Skin is warm and dry.    Assessment/Plan: GI Bleed  Location not identified as of now.  NM Tagged scan- possibly small bowel  Recommend CTA Abdomen  Will make recommendations once complete. May require Colonoscopy if located in [place].   Possible mesenteric angio. However, if in small bowel may be unable to embolize.  Possible capsule endoscopy if bleeding subsides and patient remains stable.    Esco, Miechia A 07/13/2016, 9:19 AM

## 2016-07-15 ENCOUNTER — Other Ambulatory Visit: Payer: Self-pay | Admitting: Hematology and Oncology

## 2016-07-15 LAB — TYPE AND SCREEN
ABO/RH(D): O POS
Antibody Screen: NEGATIVE
Unit division: 0
Unit division: 0
Unit division: 0
Unit division: 0
Unit division: 0
Unit division: 0

## 2016-07-16 ENCOUNTER — Encounter: Payer: Self-pay | Admitting: Gastroenterology

## 2016-07-17 ENCOUNTER — Telehealth: Payer: Self-pay | Admitting: *Deleted

## 2016-07-17 NOTE — Telephone Encounter (Signed)
Called patient significant other and discussed appt next Wednesday to see Dr. Mike Gip and a decision will be made that day r/t the LP and chemo. Voiced understanding.

## 2016-07-17 NOTE — Telephone Encounter (Signed)
Called to ask when Dr Mike Gip wants to see Matthew Brown. He missed his appt on the 19th due to being hospitalized. Please advise

## 2016-07-17 NOTE — Telephone Encounter (Signed)
appt made for Wednesday 1-31 family notified.

## 2016-07-23 ENCOUNTER — Other Ambulatory Visit: Payer: Self-pay | Admitting: Hematology and Oncology

## 2016-07-23 ENCOUNTER — Encounter: Payer: Self-pay | Admitting: Hematology and Oncology

## 2016-07-23 ENCOUNTER — Telehealth: Payer: Self-pay | Admitting: *Deleted

## 2016-07-23 ENCOUNTER — Inpatient Hospital Stay (HOSPITAL_BASED_OUTPATIENT_CLINIC_OR_DEPARTMENT_OTHER): Payer: Medicare HMO | Admitting: Hematology and Oncology

## 2016-07-23 ENCOUNTER — Inpatient Hospital Stay: Payer: Medicare HMO

## 2016-07-23 VITALS — BP 137/59 | HR 64 | Temp 97.4°F | Resp 18 | Wt 123.5 lb

## 2016-07-23 DIAGNOSIS — N189 Chronic kidney disease, unspecified: Secondary | ICD-10-CM

## 2016-07-23 DIAGNOSIS — Z7952 Long term (current) use of systemic steroids: Secondary | ICD-10-CM

## 2016-07-23 DIAGNOSIS — R51 Headache: Secondary | ICD-10-CM

## 2016-07-23 DIAGNOSIS — D500 Iron deficiency anemia secondary to blood loss (chronic): Secondary | ICD-10-CM

## 2016-07-23 DIAGNOSIS — M1990 Unspecified osteoarthritis, unspecified site: Secondary | ICD-10-CM

## 2016-07-23 DIAGNOSIS — I129 Hypertensive chronic kidney disease with stage 1 through stage 4 chronic kidney disease, or unspecified chronic kidney disease: Secondary | ICD-10-CM

## 2016-07-23 DIAGNOSIS — K5790 Diverticulosis of intestine, part unspecified, without perforation or abscess without bleeding: Secondary | ICD-10-CM | POA: Diagnosis not present

## 2016-07-23 DIAGNOSIS — Z87891 Personal history of nicotine dependence: Secondary | ICD-10-CM | POA: Diagnosis not present

## 2016-07-23 DIAGNOSIS — K269 Duodenal ulcer, unspecified as acute or chronic, without hemorrhage or perforation: Secondary | ICD-10-CM

## 2016-07-23 DIAGNOSIS — N400 Enlarged prostate without lower urinary tract symptoms: Secondary | ICD-10-CM

## 2016-07-23 DIAGNOSIS — C7951 Secondary malignant neoplasm of bone: Secondary | ICD-10-CM

## 2016-07-23 DIAGNOSIS — I10 Essential (primary) hypertension: Secondary | ICD-10-CM

## 2016-07-23 DIAGNOSIS — C787 Secondary malignant neoplasm of liver and intrahepatic bile duct: Secondary | ICD-10-CM

## 2016-07-23 DIAGNOSIS — C8338 Diffuse large B-cell lymphoma, lymph nodes of multiple sites: Secondary | ICD-10-CM

## 2016-07-23 DIAGNOSIS — Z79899 Other long term (current) drug therapy: Secondary | ICD-10-CM | POA: Diagnosis not present

## 2016-07-23 DIAGNOSIS — K219 Gastro-esophageal reflux disease without esophagitis: Secondary | ICD-10-CM | POA: Diagnosis not present

## 2016-07-23 DIAGNOSIS — Z940 Kidney transplant status: Secondary | ICD-10-CM

## 2016-07-23 DIAGNOSIS — R634 Abnormal weight loss: Secondary | ICD-10-CM

## 2016-07-23 LAB — CBC WITH DIFFERENTIAL/PLATELET
Basophils Absolute: 0 10*3/uL (ref 0–0.1)
Basophils Relative: 0 %
Eosinophils Absolute: 0 10*3/uL (ref 0–0.7)
Eosinophils Relative: 1 %
HCT: 29.9 % — ABNORMAL LOW (ref 40.0–52.0)
Hemoglobin: 9.4 g/dL — ABNORMAL LOW (ref 13.0–18.0)
Lymphocytes Relative: 5 %
Lymphs Abs: 0.4 10*3/uL — ABNORMAL LOW (ref 1.0–3.6)
MCH: 28.4 pg (ref 26.0–34.0)
MCHC: 31.4 g/dL — ABNORMAL LOW (ref 32.0–36.0)
MCV: 90.3 fL (ref 80.0–100.0)
Monocytes Absolute: 0.3 10*3/uL (ref 0.2–1.0)
Monocytes Relative: 4 %
Neutro Abs: 7.4 10*3/uL — ABNORMAL HIGH (ref 1.4–6.5)
Neutrophils Relative %: 90 %
Platelets: 172 10*3/uL (ref 150–440)
RBC: 3.31 MIL/uL — ABNORMAL LOW (ref 4.40–5.90)
RDW: 19.1 % — ABNORMAL HIGH (ref 11.5–14.5)
WBC: 8.1 10*3/uL (ref 3.8–10.6)

## 2016-07-23 LAB — URIC ACID: Uric Acid, Serum: 5.8 mg/dL (ref 4.4–7.6)

## 2016-07-23 LAB — COMPREHENSIVE METABOLIC PANEL
ALT: 9 U/L — ABNORMAL LOW (ref 17–63)
AST: 19 U/L (ref 15–41)
Albumin: 3.2 g/dL — ABNORMAL LOW (ref 3.5–5.0)
Alkaline Phosphatase: 65 U/L (ref 38–126)
Anion gap: 4 — ABNORMAL LOW (ref 5–15)
BUN: 14 mg/dL (ref 6–20)
CO2: 26 mmol/L (ref 22–32)
Calcium: 9.3 mg/dL (ref 8.9–10.3)
Chloride: 106 mmol/L (ref 101–111)
Creatinine, Ser: 1.06 mg/dL (ref 0.61–1.24)
GFR calc Af Amer: >60 mL/min (ref >60)
GFR calc non Af Amer: >60 mL/min (ref >60)
Glucose, Bld: 116 mg/dL — ABNORMAL HIGH (ref 65–99)
Potassium: 4.9 mmol/L (ref 3.5–5.1)
Sodium: 136 mmol/L (ref 135–145)
Total Bilirubin: 0.4 mg/dL (ref 0.3–1.2)
Total Protein: 5.2 g/dL — ABNORMAL LOW (ref 6.5–8.1)

## 2016-07-23 LAB — LACTATE DEHYDROGENASE: LDH: 157 U/L (ref 98–192)

## 2016-07-23 LAB — PROTIME-INR
INR: 1.2
Prothrombin Time: 15.2 seconds (ref 11.4–15.2)

## 2016-07-23 LAB — APTT: aPTT: 33 seconds (ref 24–36)

## 2016-07-23 NOTE — Telephone Encounter (Signed)
Discharge Summaries - in this encounter  Wyonia Hough, MD - 07/16/2016 11:15 AM EST Formatting of this note may be different from the original. Physician Discharge Summary  Identifying Information:  Matthew Brown 04/23/1941 [id number]  Admit date: 07/13/2016  Discharge date: 07/16/2016   Discharge Service: Med General [place] ([place])  Discharge Attending Physician: Harland German, MD  Discharge to: Home  Discharge Diagnoses: Principal Problem (Resolved): GIB (gastrointestinal bleeding) Active Problems: * No active hospital problems. *  Outpatient Provider Follow Up Issues:  Follow up heart rate off of metoprolol Follow up leg edema and furosemide dosing  Hospital Course:  Patientis a 76 y.o.malewith a history of ESRD s/p kidney transplant, B Cell Lymphoma receiving chemotherapy, Atrial fibrillation who was transferred from an OSH with an Upper GIB. His hospital course is below:  Upper GI Bleed: Hemoglobin was 6.8 in GI clinic and grossly heme positive. Patient was admitted to [facility] for further workup of GI bleeding. Patient received 2U of prbc and was started on BID IV PPI. He underwent an EGD on 1/17 which showed non bleeding duodenal ulcers. He continued to have melanotic stools and dropping Hb. A tagged RBC scan was completed on 1/19 which showed a presence of a bleed in the lateral RIGHT mid abdomen coursing through multiple curvilinear bowel loops in the RIGHT mid abdomen concerning for a small bowel bleed. Patient became hypotensive and hemoglobin dropped from 7.5 to 5 and patient was transferred to the ICU. He was fluid resuscitated and received an additional 2U of pRBC. Patient was transferred for consideration of small bowel enteroscopy. Since arrival at [facility], Hgb has remained stable. GI performed colonoscopy/push enteroscopy which showed several diverticula and old blood but no source of bleeding. Source of bleed presumed to be diverticular,  although it is possible a small bowel site was not visualized on endoscopy. Since hemoglobin continued to rise, further workup was not pursued. On discharge patient was transitioned to his home omeprazole.   History of Renal Transplant: Patient's home medications of prednisone 5 mg daily and tacrolimus 3 mg BID were continued. Cr remained at baseline. Has follow up with Dr. Suzan Nailer on 1/30 at 11am, labs at 10:30.   Atrial fibrillation: Patient's metoprolol was held on admission due to hypotension, although there is no record of him filling this since October 2017 and he is not sure if he was still taking it. This was not restarted due to ongoing sinus bradycardia in the 50s-60s. This will need to be reevaluated as an outpatient.   Lower extremity edema: Furosemide was also held due to hypotension. He was taking 40 mg bid prior to admission. 40 mg daily was started on discharge and his leg edema/furosemide dosing will also need to be followed up upon discharge.   Procedures: PR ENDOSCOPY UPPER SMALL INTESTINE [44360] (SMALL INTESTINAL ENDOSCOPY, ENTEROSCOPY BEYOND SECOND PORTION OF DUODENUM, NOT INCL ILEUM; DX, INCL COLLECTION OF SPECIMEN(S) BY BRUSHING OR WASHING, WHEN PERFORMED) PR COLSC FLEXIBLE W/CONTROL BLEEDING ANY METHOD [45382] (COLONOSCOPY, FLEXIBLE, PROXIMAL TO SPLENIC FLEXURE; DX, W/CONTROL OF BLEEDING) ______________________________________________________________________  Discharge Day Services: BP 150/27  Pulse 59  Temp 36.5 C  Resp 23  Ht 172.7 cm (5' 7.99")  Wt 65.6 kg (144 lb 10 oz)  SpO2 97%  BMI 21.99 kg/m  Pt seen on the day of discharge and determined appropriate for discharge.  Condition at Discharge: good ______________________________________________________________________ Discharge Medications:  Your Medication List   STOP taking these medications  metoprolol tartrate 25  MG tablet Commonly known as: LOPRESSOR   CONTINUE taking these medications  albuterol  90 mcg/actuation inhaler Commonly known as: PROAIR HFA Inhale 2 puffs every four (4) hours as needed.  allopurinol 100 MG tablet Commonly known as: ZYLOPRIM TAKE 1 TABLET (100 MG TOTAL) BY MOUTH DAILY.  carboxymethylcellulose sodium 0.25 % Drop Commonly known as: THERATEARS Administer 2 drops to both eyes 4 (four) times a day as needed (Dry eyes).  colchicine 0.6 mg tablet Take 1 tablet (0.6 mg total) by mouth Two (2) times a day.  cyclobenzaprine 10 MG tablet Commonly known as: FLEXERIL Take 1 tablet (10 mg total) by mouth Three (3) times a day as needed for muscle spasms.  finasteride 5 mg tablet Commonly known as: PROSCAR TAKE 1 TABLET (5 MG TOTAL) BY MOUTH DAILY.  furosemide 80 MG tablet Commonly known as: LASIX Take 0.5 tablets (40 mg total) by mouth daily.  hypromellose 2.5 % ophthalmic solution Commonly known as: GONIOVISC 1 drop as needed.  miscellaneous medical supply Misc Blood pressure cuff and monitor, monitor daily  multivitamin per tablet Generic drug: multivitamin Take 1 tablet by mouth daily.  omeprazole 20 MG capsule Commonly known as: PriLOSEC Take 1 capsule (20 mg total) by mouth daily.  oxybutynin 5 MG 24 hr tablet Commonly known as: DITROPAN-XL TAKE 1 TABLET (5 MG TOTAL) BY MOUTH ONCE DAILY.  oxyCODONE 5 MG immediate release tablet Commonly known as: ROXICODONE Take 1 tablet (5 mg total) by mouth every eight (8) hours as needed for pain.  predniSONE 5 MG tablet Commonly known as: DELTASONE Take 1 tablet (5 mg total) by mouth daily.  sucralfate 1 gram tablet Commonly known as: CARAFATE TAKE 1 TABLET (1 G TOTAL) BY MOUTH FOUR (4) TIMES A DAY.  tacrolimus 1 MG capsule Commonly known as: PROGRAF Take 3 capsules (3mg ) in the morning and 3 capsules (3mg ) at night. Z94.0  tamsulosin 0.4 mg capsule Commonly known as: FLOMAX TAKE 1 CAPSULE (0.4 MG TOTAL) BY MOUTH TWO (2) TIMES A  DAY.    ______________________________________________________________________ Pending Test Results (if blank, then none): Order Current Status  Surgical pathology exam In process    Most Recent Labs: Microbiology Results (last day)  ** No results found for the last 24 hours. **    Lab Results  Component Value Date  WBC 8.5 07/15/2016  HGB 8.0 (L) 07/15/2016  HCT 25.1 (L) 07/15/2016  PLT 126 (L) 07/15/2016   Lab Results  Component Value Date  NA 136 07/16/2016  K 3.8 07/16/2016  CL 115 (H) 07/16/2016  CO2 21.0 (L) 07/16/2016  BUN 14 07/16/2016  CREATININE 0.82 07/16/2016  CALCIUM 7.3 (L) 07/16/2016  MG 1.6 07/16/2016  PHOS 2.8 01/16/2016   Lab Results  Component Value Date  ALKPHOS 37 (L) 07/13/2016  BILITOT <0.1 07/13/2016  BILIDIR 0.38 03/07/2016  PROT 3.3 (L) 07/13/2016  ALBUMIN 1.9 (L) 07/13/2016  ALT 26 07/13/2016  AST 11 (L) 07/13/2016  GGT 47 07/22/2015   Lab Results  Component Value Date  PT 14.4 (H) 07/13/2016  INR 1.23 07/13/2016  APTT 27.6 07/13/2016   Hospital Radiology: No results found.  ______________________________________________________________________  Discharge Instructions:   Follow Up instructions and Outpatient Referrals  Discharge instructions  You were admitted for a bleed in your GI tract. We performed a colonoscopy and upper endoscopy to try and identify a source of bleeding and found old blood in your colon, likely representing a diverticular bleed, which is bleeding from blood vessels present in outpouchings in your  colon. There was no evidence of ongoing bleeding and your blood counts remained steady so no more intervention was made. If you notice bleeding again seek medical attention.  Your heart rate was a bit slow while you were here, so stop taking metoprolol until you see Dr. Suzan Nailer in clinic. You should be scheduled to see her within the next two weeks.   Order to schedule follow-up visit with primary care physician   Instructions for follow-up: within two weeks for follow up of hospitalization    Length of Discharge: I spent greater than 30 mins in the discharge of this patient.     Associated attestation - Kimel-Scott, Shella Maxim, MD - 07/16/2016 11:47 AM EST  I saw and evaluated the patient, participating in the key portions of the service. I reviewed the resident's note. I agree with the resident's findings and plan. Zane Herald, MD

## 2016-07-23 NOTE — Progress Notes (Signed)
Patient offers no complaints today. 

## 2016-07-23 NOTE — Progress Notes (Signed)
Matthew Clinic day:  Brown   Chief Complaint: Matthew Brown is a 76 y.o. male with post-transplant lymphoproliferative disorder, stage IVBE diffuse large B cell lymphoma, who is seen for reassessment after interval hospitalization.  HPI:  The patient was last seen in the medical oncology clinic on 06/20/2016.  At that time, he was feeling better.  He had gained 2 pounds.  He received cycle #3 mini-RCHOP with Neulasta support.  He was admitted to [facility] from 06/25/2016 - 06/28/2016 with gastroenteritis.  Abdomen and pelvic CT scan on 06/25/2016 revealed decrease in retroperitoneal and mesenteric adenopathy.  CBC on 06/26/2016 revealed a hematocrit of 27.4, hemoglobin 9.1, MCV 82.7, platelets 92,000, and WBC 13,900 with an [place] of 12,900.  CBC in clinic on 07/07/2016 revealed a hematocrit of 16.4, hemoglobin 5.3, and platelets 181,000.  He received 2 units of PRBCs.  Hemoglobin was 7.8 on 07/09/2016.  He was admitted to [facility] from 07/08/2016 - 07/13/2016 with Brown bleeding.  He noted dark colored stool and bright red blood on 07/06/2016. He underwent EGD by Matthew Brown on 07/09/2016.  There were multiple non-bleeding duodenal ulcer as well as duodenitis.  He underwent tagged RBC scan on 07/11/2016.  Imaging revealed active Brown bleed in the lateral right mid abdomen coursing through multiple curvilinear bowel loops in the right mid abdomen favoring a small bowel source of bleeding.  During his admission, he became hypotensive when his hemoglobin dropped from 7.5 to 5.0.  He was transferred to the ICU.  he received 3 units of PRBCs, 2 units of pheresed platelets and vitamin K.  Aspirin was discontinued.  Because of ongoing bleeding, he was transferred to [facility] for deep small bowel enteroscopy.    He was an inpatient at [facility] from 07/13/2016 - 07/16/2016.  Matthew Brown performed a colonoscopy/push enteroscopy which showed several diverticula and old blood but no source of  bleeding. Source of bleed was presumed to be diverticular, although it was possible a small bowel site was not visualized on endoscopy.  Since his hemoglobin continued to rise, no further workup was not pursued.  He was transitioned to oral omeprazole.  Symptomatically, he feels "pretty good".  He denies any melena or hematochezia.  He states that he was told not to strain to have bowel movements.  He is eating much better.  He has some back pain if he sits down.  He also has some gas pain.   Past Medical History:  Diagnosis Date  . Benign prostatic hypertrophy   . Chronic headache 10/19/2015  . ED (erectile dysfunction)   . End stage renal disease ([place])   . Essential hypertension   . GERD (gastroesophageal reflux disease)   . GIB (gastrointestinal bleeding)    a. 11/452 s/p R colic artery embolization;  b. 02/2015 EGD: duod ulcerative mass->Bx notable for coagulative necrosis - ? ischemia vs thrombosis-->coumadin d/c'd.  . Gout   . Hearing loss   . Hemorrhoids   . Hyperlipidemia   . Lymphoma ([place])   . Lymphoma ([place]) 2017  . Multiple thyroid nodules 06/06/2016   Noted on carotid US; dedicated US to be ordered by staff  . Osteoarthrosis, unspecified whether generalized or localized, lower leg   . Persistent atrial fibrillation ([place])    a. CHA2DS2VASc = 3-->coumadin d/c'd 02/2015 2/2 recurrent GIB.  [name] Prostatitis   . Pulmonary hypertension    a. 10/2014 Echo: EF 60-65%, mild to mod MR, mildly dil LA, nl RV, PASP  68mHg.  .Marland KitchenRenal transplant recipient   . Ulcers of both great toes ([facility]     Past Surgical History:  Procedure Laterality Date  . BACK SURGERY    . ESOPHAGOGASTRODUODENOSCOPY  03/13/15   severe esophagitis, ulcerated mass  . ESOPHAGOGASTRODUODENOSCOPY (EGD) WITH PROPOFOL N/A 07/09/2016   Procedure: ESOPHAGOGASTRODUODENOSCOPY (EGD) WITH PROPOFOL;  Surgeon: Matthew Bellows MD;  Location: [facility] ENDOSCOPY;  Service: Endoscopy;  Laterality: N/A;  . HERNIA REPAIR  1974  . PERIPHERAL  VASCULAR CATHETERIZATION N/A 05/07/2016   Procedure: PGlori LuisCath Insertion;  Surgeon: JAlgernon Huxley MD;  [facility];  Service: Cardiovascular;  Laterality: N/A;  . PROSTATE ABLATION    . STOMACH SURGERY     blood vessel burst  . THROAT SURGERY    . TOTAL KNEE ARTHROPLASTY      Family History  Problem Relation Age of Onset  . Cancer Mother     throat  . Diabetes Brother   . Heart disease Brother   . Stroke Brother   . Hypertension Brother   . Diabetes Sister   . Heart disease Sister   . Hypertension Sister   . Diabetes Sister   . Diabetes Brother   . COPD Neg Hx   . Kidney disease Neg Hx   . Prostate cancer Neg Hx     Social History:  reports that he quit smoking about 38 years ago. His smoking use included Cigarettes. He has a 25.00 pack-year smoking history. He has never used smokeless tobacco. He reports that he does not drink alcohol or use drugs.  He stopped smoking in 1981.  He smoked 3 cigarettes/day.  [place]  The patient is accompanied by his wife, LMarcelino Brown  today.  Allergies: No Known Allergies  Current Medications: Current Outpatient Prescriptions  Medication Sig Dispense Refill  . acetaminophen (TYLENOL) 325 MG tablet Take 2 tablets (650 mg total) by mouth every 6 (six) hours as needed for mild pain (or Fever >/= 101).    .Marland Kitchenalbuterol (PROAIR HFA) 108 (90 BASE) MCG/ACT inhaler Inhale 1-2 puffs into the lungs every 4 (four) hours as needed.     .Marland Kitchenallopurinol (ZYLOPRIM) 100 MG tablet Take 100 mg by mouth daily.      .Marland KitchenCOLCRYS 0.6 MG tablet Take 1 tablet by mouth 2 (two) times daily as needed.    . diphenhydrAMINE (BENADRYL) 25 mg capsule Take 1 capsule (25 mg total) by mouth at bedtime as needed for sleep. 30 capsule 0  . feeding supplement, ENSURE ENLIVE, (ENSURE ENLIVE) LIQD Take 237 mLs by mouth 3 (three) times daily between meals. 90 Bottle 0  . finasteride (PROSCAR) 5 MG tablet Take 5 mg by mouth daily.      . furosemide (LASIX) 20  MG tablet Take 20 mg by mouth daily. Take 1-2 tablets daily.    . hydroxypropyl methylcellulose (ISOPTO TEARS) 2.5 % ophthalmic solution Place 1 drop into both eyes as needed.     . Multiple Vitamin (MULTIVITAMIN) tablet Take 1 tablet by mouth daily.      . ondansetron (ZOFRAN) 4 MG tablet Take 1 tablet (4 mg total) by mouth every 6 (six) hours as needed for nausea. 20 tablet 0  . oxybutynin (DITROPAN-XL) 5 MG 24 hr tablet Take 5 mg by mouth daily.    . pantoprazole (PROTONIX) 40 MG tablet Take 1 tablet (40 mg total) by mouth 2 (two) times daily. 60 tablet 0  . predniSONE (DELTASONE) 5 MG tablet Take  5 mg by mouth daily.     . simethicone (MYLICON) 80 MG chewable tablet Chew 1 tablet (80 mg total) by mouth every 6 (six) hours as needed for flatulence. 120 tablet 0  . sucralfate (CARAFATE) 1 g tablet Take 1 tablet (1 g total) by mouth 4 (four) times daily. Resume taking after one week- once finished taking oral levaquine. ( to avoid interaction.) 40 tablet 3  . tacrolimus (PROGRAF) 1 MG capsule Take 3 mg by mouth 2 (two) times daily. Reported on 08/16/2015    . Tamsulosin HCl (FLOMAX) 0.4 MG CAPS Take 0.4 mg by mouth daily.      [name] HYDROcodone-acetaminophen (NORCO) 5-325 MG tablet Take 1 tablet by mouth every 4 (four) hours as needed for moderate pain. (Patient not taking: Reported on 06/25/2016) 30 tablet 0  . oxyCODONE-acetaminophen (ROXICET) 5-325 MG tablet Take 0.5-1 tablets by mouth every 6 (six) hours as needed for severe pain. (Patient not taking: Reported on 07/08/2016) 30 tablet 0   No current facility-administered medications for this visit.    Facility-Administered Medications Ordered in Other Visits  Medication Dose Route Frequency Provider Last Rate Last Dose  . methotrexate (PF) 12 mg in sodium chloride 0.9 % INTRATHECAL chemo injection   Intrathecal Once Lequita Asal, MD        Review of Systems:  GENERAL:  Feels "pretty good".  No fevers or sweats.  Weight down 7  pounds. PERFORMANCE STATUS (ECOG):  1 HEENT:  No visual changes, sore throat, mouth sores or tenderness. Lungs: No shortness of breath or cough.  No hemoptysis. Cardiac:  No chest pain, palpitations, orthopnea, or PND. Brown:  Appetite improving.  Eating better.  Gas pain.  No nausea, vomiting, diarrhea, constipation, melena or hematochezia. GU:  Enlarged prostate.  No urgency, frequency, dysuria, or hematuria. Musculoskeletal:  Back pain.  No joint pain.  No muscle tenderness. Extremities:  No pain or swelling. Skin:  No rashes or skin changes. Neuro:  Chronic headaches.  No numbness or weakness, balance or coordination issues. Endocrine:  No diabetes, thyroid issues, hot flashes or night sweats. Psych:  No mood changes, depression or anxiety. Pain:  Back pain if sits down. Review of systems:  All other systems reviewed and found to be negative.  Physical Exam: Blood pressure (!) 137/59, pulse 64, temperature 97.4 F (36.3 C), resp. rate 18, weight 123 lb 7.3 oz (56 kg). GENERAL:  Thin elderly gentleman sitting comfortably in the exam room in no acute distress. MENTAL STATUS:  Alert and oriented to person, place and time. HEAD:  Wearing a blue cap.  [place].  Normocephalic, atraumatic, face symmetric, no Cushingoid features. EYES:  Glasses.  Brown eyes.  Pupils equal round and reactive to light and accomodation.  No conjunctivitis or scleral icterus. ENT:  Oropharynx clear without lesion.  Edentulous.  Tongue normal. Mucous membranes moist.  RESPIRATORY:  Clear to auscultation without rales, wheezes or rhonchi. CARDIOVASCULAR:  Regular rate and rhythm without murmur, rub or gallop. ABDOMEN:  Soft, non-tender, with active bowel sounds, and no hepatosplenomegaly.  No masses.   SKIN:  No rashes, ulcers or lesions. EXTREMITIES:  No edema, no skin discoloration or tenderness.  No palpable cords. NEUROLOGICAL: Unremarkable. PSYCH:  Appropriate.    Office Visit on 07/23/2016  Component  Date Value Ref Range Status  . WBC 07/23/2016 8.1  3.8 - 10.6 K/uL Final  . RBC 07/23/2016 3.31* 4.40 - 5.90 MIL/uL Final  . Hemoglobin 07/23/2016 9.4* 13.0 - 18.0 g/dL  Final  . HCT 07/23/2016 29.9* 40.0 - 52.0 % Final  . MCV 07/23/2016 90.3  80.0 - 100.0 fL Final  . MCH 07/23/2016 28.4  26.0 - 34.0 pg Final  . MCHC 07/23/2016 31.4* 32.0 - 36.0 g/dL Final  . RDW 07/23/2016 19.1* 11.5 - 14.5 % Final  . Platelets 07/23/2016 172  150 - 440 K/uL Final  . Neutrophils Relative % 07/23/2016 90  % Final  . Neutro Abs 07/23/2016 7.4* 1.4 - 6.5 K/uL Final  . Lymphocytes Relative 07/23/2016 5  % Final  . Lymphs Abs 07/23/2016 0.4* 1.0 - 3.6 K/uL Final  . Monocytes Relative 07/23/2016 4  % Final  . Monocytes Absolute 07/23/2016 0.3  0.2 - 1.0 K/uL Final  . Eosinophils Relative 07/23/2016 1  % Final  . Eosinophils Absolute 07/23/2016 0.0  0 - 0.7 K/uL Final  . Basophils Relative 07/23/2016 0  % Final  . Basophils Absolute 07/23/2016 0.0  0 - 0.1 K/uL Final  . Sodium 07/23/2016 136  135 - 145 mmol/L Final  . Potassium 07/23/2016 4.9  3.5 - 5.1 mmol/L Final  . Chloride 07/23/2016 106  101 - 111 mmol/L Final  . CO2 07/23/2016 26  22 - 32 mmol/L Final  . Glucose, Bld 07/23/2016 116* 65 - 99 mg/dL Final  . BUN 07/23/2016 14  6 - 20 mg/dL Final  . Creatinine, Ser 07/23/2016 1.06  0.61 - 1.24 mg/dL Final  . Calcium 07/23/2016 9.3  8.9 - 10.3 mg/dL Final  . Total Protein 07/23/2016 5.2* 6.5 - 8.1 g/dL Final  . Albumin 07/23/2016 3.2* 3.5 - 5.0 g/dL Final  . AST 07/23/2016 19  15 - 41 U/L Final  . ALT 07/23/2016 9* 17 - 63 U/L Final  . Alkaline Phosphatase 07/23/2016 65  38 - 126 U/L Final  . Total Bilirubin 07/23/2016 0.4  0.3 - 1.2 mg/dL Final  . GFR calc non Af Amer 07/23/2016 >60  >60 mL/min Final  . GFR calc Af Amer 07/23/2016 >60  >60 mL/min Final   Comment: (NOTE) The eGFR has been calculated using the CKD EPI equation. This calculation has not been validated in all clinical  situations. eGFR's persistently <60 mL/min signify possible Chronic Kidney Disease.   . Anion gap 07/23/2016 4* 5 - 15 Final  . Prothrombin Time 07/23/2016 15.2  11.4 - 15.2 seconds Final  . INR 07/23/2016 1.20   Final  . LDH 07/23/2016 157  98 - 192 U/L Final  . Uric Acid, Serum 07/23/2016 5.8  4.4 - 7.6 mg/dL Final    Assessment:  JONATHIN HEINICKE is a 76 y.o. male s/p renal transplant (2007) with a post-transplant lymphoproliferative disorder, stage IV diffuse large B cell lymphoma.  He presented with a 2-3 month history of progressive back pain superimposed on chronic back pain.    PET scan on 04/28/2016 revealed bulky intensely hypermetabolic periaortic upper abdominal and mesenteric adenopathy concerning for high-grade lymphoma.  There was hypermetabolic liver metastasis.  There was hpermetabolic lesion involving the small bowel of the upper pelvis.  There were multiple sites of hypermetabolic skeletal metastasis. There was moderate volume right pneumothorax.  CT guided retroperitoneal node biopsy on 04/30/2016 revealed diffuse large B cell lymphoma.  Hepatitis B and C testing on 05/06/2016 were negative.  Echo on 05/06/2016 revealed an EF of 55-60%.  Bone marrow aspirate and biopsy on 05/08/2016 revealed multifocal marrow involvement by diffuse large B-cell lymphoma. There was variably cellular marrow for age (50% - 90%) with a  patchy predominantly nodular large B-cell infiltrate, overall estimated to account for 20% of the core biopsy. There was adequate residual trilineage hematopoiesis with mild nonspecific dyserythropoiesis. There was patchy mild increase in reticulin. Storage iron was present. The immunohistochemical staining pattern of the B-cell infiltrate (CD10 +/-, BCL 6+, BCL-2 +) suggested possible large cell transformation of follicular lymphoma.  Flow cytometry revealed no significant immunophenotypic abnormalities or evidence of B-cell lymphoma.  He has bone metastasis and  hypercalcemia.  Calcium was 11.2 (ionized 7.3) on 05/13/2016.  He received Zometa on 05/13/2016.  He received Xgeva on 06/09/2016.  Work-up on 04/15/2016 revealed the following normal studies: ferritin (343), iron saturation (6%), TIBC (241; low), B12 (525), folate (27).  Reticulocyte count was 2%.  LDH was 409.  Uric acid was 7.7 (4.4 - 7.6).  He was admitted at [facility] from 04/28/2016 - 04/30/2016 with a moderate volume right sided pneumothorax.  His pneumothorax improved spontaneously. Plain films of the right femur revealed the lucent bone lesion in the right femoral neck  was poorly characterized.  There was no evidence for an acute fracture.  PTH was 106 (high) 04/30/2016 with a calcium of 11.2.  Etiology was c/w primary hyperparathyroidism.  PTH-related polypeptide was < 1.1 on 04/30/2016.  He has a history of Brown bleeding in 08/2014.  He underwent tagged RBC scan which revealed an active bleed in the hepatic flexure.  He was embolized in vascular interventional radiology.  He was admitted to [facility] from 07/08/2016 - 07/13/2016 with Brown bleeding.  EGD on 07/09/2016 revealed multiple non-bleeding duodenal ulcer as well as duodenitis. Tagged RBC scan on 07/11/2016 revealed active Brown bleed in the lateral right mid abdomen coursing through multiple curvilinear bowel loops in the right mid abdomen favoring a small bowel source of bleeding.  He received 5 units of PRBCs, 2 units of pheresed platelets, and vitamin K from 07/07/2016 0 07/13/2016.  He was transferred to [facility] from 07/13/2016 - 07/16/2016.  Matthew Brown performed a colonoscopy and push enteroscopy which showed several diverticula and old blood but no source of bleeding. Source of bleed was presumed to be diverticular, although it was possible a small bowel site was not visualized on endoscopy.  He has a history of renal failure s/p renal transplant.  He is tacrolimus (Prograf), and steroids  Creatinine has ranged between 1.43 - 1.93 in the past 6 months.  Mycophenolate (MMF) was discontinued at diagnosis.  Prograf level was 5.5 (3.0-8.0) on 05/08/2016.   He is s/p 3 cycles of mini-RCHOP (05/09/2016 - 06/20/2016).  Cycle #1 was complicated by fever and neutropenia.  All cultures were negative.  He was treated empirically with Cefepime.  Peripheral smear revealed schistocytes.  Cycle #3 was complicated by a Brown bleed.  Symptomatically, he is feeling better.  He denies any Brown bleeding.  Appetite is improving.  Plan: 1.  Labs today:  CBC with diff, CMP, LDH, uric acid, PT, PTT, hold tube. 2.  LP with IT MTX tomorrow. 3.  Patient to contact clinic if any recurrent Brown bleeding. 4.  RTC on 07/25/2016 for cycle #4 mini-RCHOP today with OnPro Neulasta 5.  RTC on 08/04/2016 for MD assessment, labs (CBC with diff, BMP, hold tube) and Xgeva.   Lequita Asal, MD  Brown, 4:13 PM

## 2016-07-24 ENCOUNTER — Ambulatory Visit
Admission: RE | Admit: 2016-07-24 | Discharge: 2016-07-24 | Disposition: A | Discharge status: Home / Self Care | Payer: Medicare HMO | Source: Ambulatory Visit | Attending: Hematology and Oncology | Admitting: Hematology and Oncology

## 2016-07-24 ENCOUNTER — Telehealth: Payer: Self-pay | Admitting: *Deleted

## 2016-07-24 ENCOUNTER — Ambulatory Visit: Payer: Commercial Managed Care - HMO | Admitting: Family Medicine

## 2016-07-24 DIAGNOSIS — I129 Hypertensive chronic kidney disease with stage 1 through stage 4 chronic kidney disease, or unspecified chronic kidney disease: Secondary | ICD-10-CM | POA: Diagnosis not present

## 2016-07-24 DIAGNOSIS — E785 Hyperlipidemia, unspecified: Secondary | ICD-10-CM | POA: Diagnosis not present

## 2016-07-24 DIAGNOSIS — N186 End stage renal disease: Secondary | ICD-10-CM | POA: Insufficient documentation

## 2016-07-24 DIAGNOSIS — Z5112 Encounter for antineoplastic immunotherapy: Secondary | ICD-10-CM | POA: Insufficient documentation

## 2016-07-24 DIAGNOSIS — K219 Gastro-esophageal reflux disease without esophagitis: Secondary | ICD-10-CM | POA: Insufficient documentation

## 2016-07-24 DIAGNOSIS — N529 Male erectile dysfunction, unspecified: Secondary | ICD-10-CM | POA: Diagnosis not present

## 2016-07-24 DIAGNOSIS — I4891 Unspecified atrial fibrillation: Secondary | ICD-10-CM | POA: Diagnosis not present

## 2016-07-24 DIAGNOSIS — Z79899 Other long term (current) drug therapy: Secondary | ICD-10-CM | POA: Insufficient documentation

## 2016-07-24 DIAGNOSIS — G8929 Other chronic pain: Secondary | ICD-10-CM | POA: Diagnosis not present

## 2016-07-24 DIAGNOSIS — Z808 Family history of malignant neoplasm of other organs or systems: Secondary | ICD-10-CM | POA: Diagnosis not present

## 2016-07-24 DIAGNOSIS — N400 Enlarged prostate without lower urinary tract symptoms: Secondary | ICD-10-CM | POA: Insufficient documentation

## 2016-07-24 DIAGNOSIS — E079 Disorder of thyroid, unspecified: Secondary | ICD-10-CM | POA: Diagnosis not present

## 2016-07-24 DIAGNOSIS — M549 Dorsalgia, unspecified: Secondary | ICD-10-CM | POA: Diagnosis not present

## 2016-07-24 DIAGNOSIS — C8338 Diffuse large B-cell lymphoma, lymph nodes of multiple sites: Secondary | ICD-10-CM | POA: Diagnosis not present

## 2016-07-24 DIAGNOSIS — Z8719 Personal history of other diseases of the digestive system: Secondary | ICD-10-CM | POA: Diagnosis not present

## 2016-07-24 DIAGNOSIS — Z5111 Encounter for antineoplastic chemotherapy: Secondary | ICD-10-CM | POA: Diagnosis not present

## 2016-07-24 DIAGNOSIS — E210 Primary hyperparathyroidism: Secondary | ICD-10-CM | POA: Insufficient documentation

## 2016-07-24 DIAGNOSIS — M109 Gout, unspecified: Secondary | ICD-10-CM | POA: Diagnosis not present

## 2016-07-24 DIAGNOSIS — Z87891 Personal history of nicotine dependence: Secondary | ICD-10-CM | POA: Insufficient documentation

## 2016-07-24 DIAGNOSIS — Z981 Arthrodesis status: Secondary | ICD-10-CM | POA: Insufficient documentation

## 2016-07-24 DIAGNOSIS — Z7952 Long term (current) use of systemic steroids: Secondary | ICD-10-CM | POA: Insufficient documentation

## 2016-07-24 DIAGNOSIS — C8333 Diffuse large B-cell lymphoma, intra-abdominal lymph nodes: Secondary | ICD-10-CM | POA: Diagnosis present

## 2016-07-24 DIAGNOSIS — R51 Headache: Secondary | ICD-10-CM | POA: Insufficient documentation

## 2016-07-24 DIAGNOSIS — Z940 Kidney transplant status: Secondary | ICD-10-CM | POA: Diagnosis not present

## 2016-07-24 DIAGNOSIS — I272 Pulmonary hypertension, unspecified: Secondary | ICD-10-CM | POA: Insufficient documentation

## 2016-07-24 DIAGNOSIS — M4800 Spinal stenosis, site unspecified: Secondary | ICD-10-CM | POA: Insufficient documentation

## 2016-07-24 LAB — PROTEIN, CSF: Total  Protein, CSF: 32 mg/dL (ref 15–45)

## 2016-07-24 LAB — CSF CELL COUNT WITH DIFFERENTIAL
Eosinophils, CSF: 0 %
Lymphs, CSF: 74 %
Monocyte-Macrophage-Spinal Fluid: 0 %
RBC Count, CSF: 4428 /mm3 — ABNORMAL HIGH (ref 0–3)
Segmented Neutrophils-CSF: 26 %
Tube #: 1
WBC, CSF: 27 /mm3 (ref 0–5)

## 2016-07-24 LAB — GLUCOSE, CSF: Glucose, CSF: 51 mg/dL (ref 40–70)

## 2016-07-24 MED ORDER — SODIUM CHLORIDE 0.9 % IJ SOLN
Freq: Once | INTRAMUSCULAR | Status: DC
  Filled 2016-07-24: qty 0.48

## 2016-07-24 MED ORDER — HYDROCODONE-ACETAMINOPHEN 5-325 MG PO TABS
1.0000 | ORAL_TABLET | Freq: Once | ORAL | Status: AC
  Administered 2016-07-24: 1 via ORAL
  Filled 2016-07-24: qty 1

## 2016-07-24 NOTE — Procedures (Signed)
Patient with diffuse B cell lymphoma requiring IT methotrexate.  Due to his previous spinal fusion, access is difficult.  Patient placed on left side.  Flouroscopy used to confirm level.  Patient prepped and draped.  Procedure time out done to ensure correct patient and procedure.  Local anesthesia was infiltrated into skin.  20 gauge needle used to access the CSF.  5 cc CSF obtained and 3 cc of methotrexate administered by Dr. Danella Deis.  No complications

## 2016-07-24 NOTE — Discharge Instructions (Signed)
Lumbar Puncture, Care After °Refer to this sheet in the next few weeks. These instructions provide you with information on caring for yourself after your procedure. Your health care provider may also give you more specific instructions. Your treatment has been planned according to current medical practices, but problems sometimes occur. Call your health care provider if you have any problems or questions after your procedure. °What can I expect after the procedure? °After your procedure, it is typical to have the following sensations: °· Mild discomfort or pain at the insertion site. °· Mild headache that is relieved with pain medicines. ° °Follow these instructions at home: ° °· Avoid lifting anything heavier than 10 lb (4.5 kg) for at least 12 hours after the procedure. °· Drink enough fluids to keep your urine clear or pale yellow. °Contact a health care provider if: °· You have fever or chills. °· You have nausea or vomiting. °· You have a headache that lasts for more than 2 days. °Get help right away if: °· You have any numbness or tingling in your legs. °· You are unable to control your bowel or bladder. °· You have bleeding or swelling in your back at the insertion site. °· You are dizzy or faint. °This information is not intended to replace advice given to you by your health care provider. Make sure you discuss any questions you have with your health care provider. °Document Released: 06/14/2013 Document Revised: 11/15/2015 Document Reviewed: 02/15/2013 °Elsevier Interactive Patient Education © 2017 [employer] ° °

## 2016-07-24 NOTE — Telephone Encounter (Signed)
Lab called to report critic RBC in CSF with 4428 RBC's.  Dr. Mike Gip informed of results

## 2016-07-25 ENCOUNTER — Other Ambulatory Visit: Payer: Self-pay | Admitting: *Deleted

## 2016-07-25 ENCOUNTER — Other Ambulatory Visit: Payer: Self-pay | Admitting: Hematology and Oncology

## 2016-07-25 ENCOUNTER — Inpatient Hospital Stay: Payer: Medicare HMO | Attending: Hematology and Oncology

## 2016-07-25 VITALS — BP 110/67 | HR 64 | Temp 96.5°F | Resp 16

## 2016-07-25 DIAGNOSIS — Z5111 Encounter for antineoplastic chemotherapy: Secondary | ICD-10-CM | POA: Diagnosis not present

## 2016-07-25 DIAGNOSIS — C801 Malignant (primary) neoplasm, unspecified: Secondary | ICD-10-CM

## 2016-07-25 DIAGNOSIS — C8339 Diffuse large B-cell lymphoma, extranodal and solid organ sites: Secondary | ICD-10-CM

## 2016-07-25 LAB — SAMPLE TO BLOOD BANK

## 2016-07-25 LAB — CYTOLOGY - NON PAP

## 2016-07-25 MED ORDER — HEPARIN SOD (PORK) LOCK FLUSH 100 UNIT/ML IV SOLN
500.0000 [IU] | Freq: Once | INTRAVENOUS | Status: AC | PRN
  Administered 2016-07-25: 500 [IU]
  Filled 2016-07-25: qty 5

## 2016-07-25 MED ORDER — ACETAMINOPHEN 325 MG PO TABS
650.0000 mg | ORAL_TABLET | Freq: Once | ORAL | Status: AC
  Administered 2016-07-25: 650 mg via ORAL
  Filled 2016-07-25: qty 2

## 2016-07-25 MED ORDER — SODIUM CHLORIDE 0.9 % IV SOLN: 10.0000 mg | Freq: Once | INTRAVENOUS | Status: DC

## 2016-07-25 MED ORDER — HYDROCODONE-ACETAMINOPHEN 5-325 MG PO TABS: 1.0000 | ORAL_TABLET | Freq: Once | ORAL | Status: DC

## 2016-07-25 MED ORDER — DEXAMETHASONE SODIUM PHOSPHATE 10 MG/ML IJ SOLN
10.0000 mg | Freq: Once | INTRAMUSCULAR | Status: AC
  Administered 2016-07-25: 10 mg via INTRAVENOUS
  Filled 2016-07-25: qty 1

## 2016-07-25 MED ORDER — PALONOSETRON HCL INJECTION 0.25 MG/5ML
0.2500 mg | Freq: Once | INTRAVENOUS | Status: AC
  Administered 2016-07-25: 0.25 mg via INTRAVENOUS
  Filled 2016-07-25: qty 5

## 2016-07-25 MED ORDER — PEGFILGRASTIM 6 MG/0.6ML ~~LOC~~ PSKT
6.0000 mg | PREFILLED_SYRINGE | Freq: Once | SUBCUTANEOUS | Status: AC
  Administered 2016-07-25: 6 mg via SUBCUTANEOUS
  Filled 2016-07-25: qty 0.6

## 2016-07-25 MED ORDER — OXYCODONE-ACETAMINOPHEN 5-325 MG PO TABS: 1.0000 | ORAL_TABLET | Freq: Four times a day (QID) | ORAL | 0 refills | Status: DC | PRN

## 2016-07-25 MED ORDER — DIPHENHYDRAMINE HCL 25 MG PO CAPS
50.0000 mg | ORAL_CAPSULE | Freq: Once | ORAL | Status: AC
  Administered 2016-07-25: 50 mg via ORAL
  Filled 2016-07-25: qty 2

## 2016-07-25 MED ORDER — SODIUM CHLORIDE 0.9 % IV SOLN
375.0000 mg/m2 | Freq: Once | INTRAVENOUS | Status: AC
  Administered 2016-07-25: 600 mg via INTRAVENOUS
  Filled 2016-07-25: qty 50

## 2016-07-25 MED ORDER — DOXORUBICIN HCL CHEMO IV INJECTION 2 MG/ML
25.0000 mg/m2 | Freq: Once | INTRAVENOUS | Status: AC
  Administered 2016-07-25: 42 mg via INTRAVENOUS
  Filled 2016-07-25: qty 21

## 2016-07-25 MED ORDER — SODIUM CHLORIDE 0.9 % IV SOLN
Freq: Once | INTRAVENOUS | Status: AC
  Administered 2016-07-25: 10:00:00 via INTRAVENOUS
  Filled 2016-07-25: qty 1000

## 2016-07-25 MED ORDER — SODIUM CHLORIDE 0.9 % IV SOLN
400.0000 mg/m2 | Freq: Once | INTRAVENOUS | Status: AC
  Administered 2016-07-25: 660 mg via INTRAVENOUS
  Filled 2016-07-25: qty 33

## 2016-07-25 MED ORDER — SODIUM CHLORIDE 0.9 % IV SOLN
1.0000 mg | Freq: Once | INTRAVENOUS | Status: AC
  Administered 2016-07-25: 1 mg via INTRAVENOUS
  Filled 2016-07-25: qty 1

## 2016-07-25 MED ORDER — HYDROCODONE-ACETAMINOPHEN 5-325 MG PO TABS
1.0000 | ORAL_TABLET | Freq: Once | ORAL | Status: AC
  Administered 2016-07-25: 1 via ORAL
  Filled 2016-07-25: qty 1

## 2016-07-25 NOTE — Progress Notes (Signed)
Per Mr Ronnald Ramp, since spinal tap on 1/31 he has been experiencing severe lower back pain 8-9/10 when ambulating and 6/10 at rest, describes pain as an "achey feeling". Pt reports that at time of tap on 1/31 pain was "severe like it's never been before", Norm Parcel, Dr Kem Parkinson nurse notified.

## 2016-07-29 ENCOUNTER — Other Ambulatory Visit: Payer: Self-pay | Admitting: *Deleted

## 2016-07-29 DIAGNOSIS — C787 Secondary malignant neoplasm of liver and intrahepatic bile duct: Secondary | ICD-10-CM

## 2016-07-29 DIAGNOSIS — C7951 Secondary malignant neoplasm of bone: Secondary | ICD-10-CM

## 2016-07-29 DIAGNOSIS — C801 Malignant (primary) neoplasm, unspecified: Secondary | ICD-10-CM

## 2016-07-31 DIAGNOSIS — Z5111 Encounter for antineoplastic chemotherapy: Secondary | ICD-10-CM | POA: Diagnosis not present

## 2016-08-01 ENCOUNTER — Other Ambulatory Visit: Payer: Self-pay | Admitting: *Deleted

## 2016-08-01 DIAGNOSIS — C8330 Diffuse large B-cell lymphoma, unspecified site: Secondary | ICD-10-CM

## 2016-08-03 NOTE — Progress Notes (Signed)
[facility] day:  08/04/2016   Chief Complaint: Matthew Brown is a 76 y.o. male with post-transplant lymphoproliferative disorder, stage IVBE diffuse large B cell lymphoma, who is seen for assessment on day 11 of cycle #4 mini-RCHOP and prior to [place].  HPI:  The patient was last seen in the medical oncology clinic on 07/23/2016.  At that time, he was feeling better.  He denied any GI bleeding.  Appetite was improving.  He underwent LP with IT MTX on 07/24/2016.  He received cycle #4 mini-RCHOP on 07/25/2016 with Neulasta support.  Lumbar puncture was difficult secondary to spinal stenosis.  Tap was bloody but then cleared.  Cell count revealed 4,427 RBC, and 27 WBCs with 26% segs and 74% lymphs.  Cytology was negative for malignancy.  Glucose was 51 and protein 31.  Symptomatically, he is feeling good.  He is eating better again.  Weight remains a concern.  He denies any fevers, sweats, or adenopathy.   Past Medical History:  Diagnosis Date  . Benign prostatic hypertrophy   . Chronic headache 10/19/2015  . ED (erectile dysfunction)   . End stage renal disease ([place])   . Essential hypertension   . GERD (gastroesophageal reflux disease)   . GIB (gastrointestinal bleeding)    a. 0/0867 s/p R colic artery embolization;  b. 02/2015 EGD: duod ulcerative mass->Bx notable for coagulative necrosis - ? ischemia vs thrombosis-->coumadin d/c'd.  . Gout   . Hearing loss   . Hemorrhoids   . Hyperlipidemia   . Lymphoma ([place])   . Lymphoma ([place]) 2017  . Multiple thyroid nodules 06/06/2016   Noted on carotid US; dedicated US to be ordered by staff  . Osteoarthrosis, unspecified whether generalized or localized, lower leg   . Persistent atrial fibrillation ([place])    a. CHA2DS2VASc = 3-->coumadin d/c'd 02/2015 2/2 recurrent GIB.  [name] Prostatitis   . Pulmonary hypertension    a. 10/2014 Echo: EF 60-65%, mild to mod MR, mildly dil LA, nl RV, PASP 68mHg.  .Marland KitchenRenal  transplant recipient   . Ulcers of both great toes ([facility]     Past Surgical History:  Procedure Laterality Date  . BACK SURGERY    . ESOPHAGOGASTRODUODENOSCOPY  03/13/15   severe esophagitis, ulcerated mass  . ESOPHAGOGASTRODUODENOSCOPY (EGD) WITH PROPOFOL N/A 07/09/2016   Procedure: ESOPHAGOGASTRODUODENOSCOPY (EGD) WITH PROPOFOL;  Surgeon: KJonathon Bellows MD;  Location: [facility] ENDOSCOPY;  Service: Endoscopy;  Laterality: N/A;  . HERNIA REPAIR  1974  . PERIPHERAL VASCULAR CATHETERIZATION N/A 05/07/2016   Procedure: PGlori LuisCath Insertion;  Surgeon: JAlgernon Huxley MD;  [facility];  Service: Cardiovascular;  Laterality: N/A;  . PROSTATE ABLATION    . STOMACH SURGERY     blood vessel burst  . THROAT SURGERY    . TOTAL KNEE ARTHROPLASTY      Family History  Problem Relation Age of Onset  . Cancer Mother     throat  . Diabetes Brother   . Heart disease Brother   . Stroke Brother   . Hypertension Brother   . Diabetes Sister   . Heart disease Sister   . Hypertension Sister   . Diabetes Sister   . Diabetes Brother   . COPD Neg Hx   . Kidney disease Neg Hx   . Prostate cancer Neg Hx     Social History:  reports that he quit smoking about 38 years ago. His smoking use included Cigarettes. He  has a 25.00 pack-year smoking history. He has never used smokeless tobacco. He reports that he does not drink alcohol or use drugs.  He stopped smoking in 1981.  He smoked 3 cigarettes/day.  He lives in [place].  The patient is accompanied by his wife, Matthew Brown,  today.  Allergies: No Known Allergies  Current Medications: Current Outpatient Prescriptions  Medication Sig Dispense Refill  . acetaminophen (TYLENOL) 325 MG tablet Take 2 tablets (650 mg total) by mouth every 6 (six) hours as needed for mild pain (or Fever >/= 101).    [name] albuterol (PROAIR HFA) 108 (90 BASE) MCG/ACT inhaler Inhale 1-2 puffs into the lungs every 4 (four) hours as needed.     [name] allopurinol (ZYLOPRIM) 100 MG  tablet Take 100 mg by mouth daily.      [name] COLCRYS 0.6 MG tablet Take 1 tablet by mouth 2 (two) times daily as needed.    . diphenhydrAMINE (BENADRYL) 25 mg capsule Take 1 capsule (25 mg total) by mouth at bedtime as needed for sleep. 30 capsule 0  . feeding supplement, ENSURE ENLIVE, (ENSURE ENLIVE) LIQD Take 237 mLs by mouth 3 (three) times daily between meals. 90 Bottle 0  . finasteride (PROSCAR) 5 MG tablet Take 5 mg by mouth daily.      . furosemide (LASIX) 20 MG tablet Take 20 mg by mouth daily. Take 1-2 tablets daily.    [name] HYDROcodone-acetaminophen (NORCO) 5-325 MG tablet Take 1 tablet by mouth every 4 (four) hours as needed for moderate pain. 30 tablet 0  . hydroxypropyl methylcellulose (ISOPTO TEARS) 2.5 % ophthalmic solution Place 1 drop into both eyes as needed.     . Multiple Vitamin (MULTIVITAMIN) tablet Take 1 tablet by mouth daily.      . ondansetron (ZOFRAN) 4 MG tablet Take 1 tablet (4 mg total) by mouth every 6 (six) hours as needed for nausea. 20 tablet 0  . oxybutynin (DITROPAN-XL) 5 MG 24 hr tablet Take 5 mg by mouth daily.    [name] oxyCODONE (OXY IR/ROXICODONE) 5 MG immediate release tablet     . pantoprazole (PROTONIX) 40 MG tablet Take 1 tablet (40 mg total) by mouth 2 (two) times daily. 60 tablet 0  . predniSONE (DELTASONE) 5 MG tablet Take 5 mg by mouth daily.     . simethicone (MYLICON) 80 MG chewable tablet Chew 1 tablet (80 mg total) by mouth every 6 (six) hours as needed for flatulence. 120 tablet 0  . sucralfate (CARAFATE) 1 g tablet Take 1 tablet (1 g total) by mouth 4 (four) times daily. Resume taking after one week- once finished taking oral levaquine. ( to avoid interaction.) 40 tablet 3  . tacrolimus (PROGRAF) 1 MG capsule Take 3 mg by mouth 2 (two) times daily. Reported on 08/16/2015    . Tamsulosin HCl (FLOMAX) 0.4 MG CAPS Take 0.4 mg by mouth daily.      . traZODone (DESYREL) 50 MG tablet 1-2 TABS AS NEEDED FOR SLEEP  3  . oxyCODONE-acetaminophen (ROXICET) 5-325 MG  tablet Take 1 tablet by mouth every 6 (six) hours as needed for severe pain. (Patient not taking: Reported on 08/04/2016) 20 tablet 0  . predniSONE (DELTASONE) 20 MG tablet      No current facility-administered medications for this visit.    Facility-Administered Medications Ordered in Other Visits  Medication Dose Route Frequency Provider Last Rate Last Dose  . HYDROcodone-acetaminophen (NORCO/VICODIN) 5-325 MG per tablet 1 tablet  1 tablet Oral Once Lequita Asal,  MD      . methotrexate (PF) 12 mg in sodium chloride 0.9 % INTRATHECAL chemo injection   Intrathecal Once Lequita Asal, MD        Review of Systems:  GENERAL:  Feels "good".  No fevers or sweats.  Weight up 2 pounds. PERFORMANCE STATUS (ECOG):  1 HEENT:  No visual changes, sore throat, mouth sores or tenderness. Lungs: No shortness of breath or cough.  No hemoptysis. Cardiac:  No chest pain, palpitations, orthopnea, or PND. GI:  Appetite improving.  No nausea, vomiting, diarrhea, constipation, melena or hematochezia. GU:  Enlarged prostate.  No urgency, frequency, dysuria, or hematuria. Musculoskeletal:  Back pain.  No joint pain.  No muscle tenderness. Extremities:  No pain or swelling. Skin:  No rashes or skin changes. Neuro:  Chronic headaches.  No numbness or weakness, balance or coordination issues. Endocrine:  No diabetes, thyroid issues, hot flashes or night sweats. Psych:  No mood changes, depression or anxiety. Pain:  Back pain if sits down. Review of systems:  All other systems reviewed and found to be negative.  Physical Exam: Blood pressure (!) 130/53, pulse 63, temperature 97 F (36.1 C), temperature source Tympanic, resp. rate 18, weight 125 lb 15.9 oz (57.2 kg). GENERAL:  Thin elderly gentleman sitting comfortably ina wheelchair in the exam room in no acute distress. MENTAL STATUS:  Alert and oriented to person, place and time. HEAD:  Wearing a cap.  Lu Duffel.  Normocephalic, atraumatic, face  symmetric, no Cushingoid features. EYES:  Glasses.  Brown eyes.  Pupils equal round and reactive to light and accomodation.  No conjunctivitis or scleral icterus. ENT:  Oropharynx clear without lesion.  Edentulous.  Tongue normal. Mucous membranes moist.  RESPIRATORY:  Clear to auscultation without rales, wheezes or rhonchi. CARDIOVASCULAR:  Regular rate and rhythm without murmur, rub or gallop. ABDOMEN:  Soft, non-tender, with active bowel sounds, and no hepatosplenomegaly.  No masses.   SKIN:  No rashes, ulcers or lesions. EXTREMITIES:  No edema, no skin discoloration or tenderness.  No palpable cords. NEUROLOGICAL: Unremarkable. PSYCH:  Appropriate.    Appointment on 08/04/2016  Component Date Value Ref Range Status  . Sodium 08/04/2016 140  135 - 145 mmol/L Final  . Potassium 08/04/2016 4.4  3.5 - 5.1 mmol/L Final  . Chloride 08/04/2016 108  101 - 111 mmol/L Final  . CO2 08/04/2016 29  22 - 32 mmol/L Final  . Glucose, Bld 08/04/2016 105* 65 - 99 mg/dL Final  . BUN 08/04/2016 16  6 - 20 mg/dL Final  . Creatinine, Ser 08/04/2016 1.07  0.61 - 1.24 mg/dL Final  . Calcium 08/04/2016 9.2  8.9 - 10.3 mg/dL Final  . GFR calc non Af Amer 08/04/2016 >60  >60 mL/min Final  . GFR calc Af Amer 08/04/2016 >60  >60 mL/min Final   Comment: (NOTE) The eGFR has been calculated using the CKD EPI equation. This calculation has not been validated in all clinical situations. eGFR's persistently <60 mL/min signify possible Chronic Kidney Disease.   Georgiann Hahn gap 08/04/2016 3* 5 - 15 Final    Assessment:  DARUIS SWAIM is a 76 y.o. male s/p renal transplant (2007) with a post-transplant lymphoproliferative disorder, stage IV diffuse large B cell lymphoma.  He presented with a 2-3 month history of progressive back pain superimposed on chronic back pain.    PET scan on 04/28/2016 revealed bulky intensely hypermetabolic periaortic upper abdominal and mesenteric adenopathy concerning for high-grade lymphoma.   There  was hypermetabolic liver metastasis.  There was hpermetabolic lesion involving the small bowel of the upper pelvis.  There were multiple sites of hypermetabolic skeletal metastasis. There was moderate volume right pneumothorax.  CT guided retroperitoneal node biopsy on 04/30/2016 revealed diffuse large B cell lymphoma.  Hepatitis B and C testing on 05/06/2016 were negative.  Echo on 05/06/2016 revealed an EF of 55-60%.  Bone marrow aspirate and biopsy on 05/08/2016 revealed multifocal marrow involvement by diffuse large B-cell lymphoma. There was variably cellular marrow for age (50% - 90%) with a patchy predominantly nodular large B-cell infiltrate, overall estimated to account for 20% of the core biopsy. There was adequate residual trilineage hematopoiesis with mild nonspecific dyserythropoiesis. There was patchy mild increase in reticulin. Storage iron was present. The immunohistochemical staining pattern of the B-cell infiltrate (CD10 +/-, BCL 6+, BCL-2 +) suggested possible large cell transformation of follicular lymphoma.  Flow cytometry revealed no significant immunophenotypic abnormalities or evidence of B-cell lymphoma.  He has bone metastasis and hypercalcemia.  Calcium was 11.2 (ionized 7.3) on 05/13/2016.  He received Zometa on 05/13/2016.  He received Xgeva on 06/09/2016.  Work-up on 04/15/2016 revealed the following normal studies: ferritin (343), iron saturation (6%), TIBC (241; low), B12 (525), folate (27).  Reticulocyte count was 2%.  LDH was 409.  Uric acid was 7.7 (4.4 - 7.6).  He was admitted at [facility] from 04/28/2016 - 04/30/2016 with a moderate volume right sided pneumothorax.  His pneumothorax improved spontaneously. Plain films of the right femur revealed the lucent bone lesion in the right femoral neck  was poorly characterized.  There was no evidence for an acute fracture.  PTH was 106 (high) 04/30/2016 with a calcium of 11.2.  Etiology was c/w primary hyperparathyroidism.   PTH-related polypeptide was < 1.1 on 04/30/2016.  He has a history of GI bleeding in 08/2014.  He underwent tagged RBC scan which revealed an active bleed in the hepatic flexure.  He was embolized in vascular interventional radiology.  He was admitted to [facility] from 07/08/2016 - 07/13/2016 with GI bleeding.  EGD on 07/09/2016 revealed multiple non-bleeding duodenal ulcer as well as duodenitis. Tagged RBC scan on 07/11/2016 revealed active GI bleed in the lateral right mid abdomen coursing through multiple curvilinear bowel loops in the right mid abdomen favoring a small bowel source of bleeding.  He received 5 units of PRBCs, 2 units of pheresed platelets, and vitamin K from 07/07/2016 0 07/13/2016.  He was transferred to [facility] from 07/13/2016 - 07/16/2016.  [facility] performed a colonoscopy and push enteroscopy which showed several diverticula and old blood but no source of bleeding. Source of bleed was presumed to be diverticular, although it was possible a small bowel site was not visualized on endoscopy.  He has a history of renal failure s/p renal transplant.  He is tacrolimus (Prograf), and steroids  Creatinine has ranged between 1.43 - 1.93 in the past 6 months. Mycophenolate (MMF) was discontinued at diagnosis.  Prograf level was 5.5 (3.0-8.0) on 05/08/2016.   He is currently day 11 of cycle #4 mini-RCHOP (05/09/2016 - 07/25/2016).  Cycle #1 was complicated by fever and neutropenia.  All cultures were negative.  He was treated empirically with Cefepime.  Peripheral smear revealed schistocytes.  Cycle #3 was complicated by a GI bleed.  CSF on 06/19/2016 revealed 7 WBCs (4% segs, 79& lymphs, and 17% monocytes).  He received his first LP with IT MTX on 07/24/2016.  Cytology was negative.  Symptomatically, he has not gained  much weight.  He denies any GI bleeding.  Exam is stable.  Plan: 1.  Labs today:  CBC with diff, BMP, hold tube. 2.  Delton See tomorrow. 3.  Restaging PET scan on 08/12/2016. 4.  RTC  on 08/13/2016 for MD assessment and labs (CBC with diff, CMP, LDH, uric acid, PT, PTT) 5.  Lumbar puncture with IT MTX on 08/14/2016. 6.  RTC on 08/15/2016 for cycle #5 mini-RCHOP today with OnPro Neulasta.   Lequita Asal, MD  08/04/2016, 4:59 PM

## 2016-08-03 NOTE — Brief Op Note (Signed)
  Date:  07/24/2016  Procedure:  Lumbar puncture with intrathecal methotrexate.  Indication:  Diffuse large B cell lymphoma for CSF prophylaxis.  Consent:  Obtained.  Procedure:  The patient underwent lumbar puncture by Dr. Rogue Jury Abd-El-Barr.  The patient was placed in the left lateral decubitus position.  Flouroscopy was used by Dr. Dava Najjar to confirm level.  The patient was prepped and draped.  Local anesthesia was infiltrated into skin.  A 20 gauge needle was used to access the CSF.  5 cc CSF was obtained (initial pink then clear).    Under sterile technique, methotrexate 12 mg in preservative free saline (total volume 3 cc) was instilled slowly over 1 minute.  The lumbar puncture needle was then removed slowly.  A small bandage was placed.  The patient tolerated the procedure well.  CSF was sent for cell count and diff, glucose, protein, and cytology.  Complications:  None.   Lequita Asal, MD

## 2016-08-04 ENCOUNTER — Inpatient Hospital Stay: Payer: Medicare HMO

## 2016-08-04 ENCOUNTER — Inpatient Hospital Stay (HOSPITAL_BASED_OUTPATIENT_CLINIC_OR_DEPARTMENT_OTHER): Payer: Medicare HMO | Admitting: Hematology and Oncology

## 2016-08-04 VITALS — BP 130/53 | HR 63 | Temp 97.0°F | Resp 18 | Wt 126.0 lb

## 2016-08-04 DIAGNOSIS — Z5111 Encounter for antineoplastic chemotherapy: Secondary | ICD-10-CM | POA: Diagnosis not present

## 2016-08-04 DIAGNOSIS — M4800 Spinal stenosis, site unspecified: Secondary | ICD-10-CM | POA: Diagnosis not present

## 2016-08-04 DIAGNOSIS — C8330 Diffuse large B-cell lymphoma, unspecified site: Secondary | ICD-10-CM

## 2016-08-04 DIAGNOSIS — C8338 Diffuse large B-cell lymphoma, lymph nodes of multiple sites: Secondary | ICD-10-CM

## 2016-08-04 DIAGNOSIS — M109 Gout, unspecified: Secondary | ICD-10-CM

## 2016-08-04 DIAGNOSIS — N529 Male erectile dysfunction, unspecified: Secondary | ICD-10-CM

## 2016-08-04 DIAGNOSIS — C787 Secondary malignant neoplasm of liver and intrahepatic bile duct: Secondary | ICD-10-CM

## 2016-08-04 DIAGNOSIS — I272 Pulmonary hypertension, unspecified: Secondary | ICD-10-CM

## 2016-08-04 DIAGNOSIS — C7951 Secondary malignant neoplasm of bone: Secondary | ICD-10-CM

## 2016-08-04 DIAGNOSIS — Z8719 Personal history of other diseases of the digestive system: Secondary | ICD-10-CM

## 2016-08-04 DIAGNOSIS — Z87891 Personal history of nicotine dependence: Secondary | ICD-10-CM

## 2016-08-04 DIAGNOSIS — I4891 Unspecified atrial fibrillation: Secondary | ICD-10-CM

## 2016-08-04 DIAGNOSIS — E210 Primary hyperparathyroidism: Secondary | ICD-10-CM

## 2016-08-04 DIAGNOSIS — R51 Headache: Secondary | ICD-10-CM

## 2016-08-04 DIAGNOSIS — I129 Hypertensive chronic kidney disease with stage 1 through stage 4 chronic kidney disease, or unspecified chronic kidney disease: Secondary | ICD-10-CM

## 2016-08-04 DIAGNOSIS — N186 End stage renal disease: Secondary | ICD-10-CM | POA: Diagnosis not present

## 2016-08-04 DIAGNOSIS — Z79899 Other long term (current) drug therapy: Secondary | ICD-10-CM

## 2016-08-04 DIAGNOSIS — E079 Disorder of thyroid, unspecified: Secondary | ICD-10-CM

## 2016-08-04 DIAGNOSIS — Z808 Family history of malignant neoplasm of other organs or systems: Secondary | ICD-10-CM

## 2016-08-04 DIAGNOSIS — Z940 Kidney transplant status: Secondary | ICD-10-CM

## 2016-08-04 DIAGNOSIS — E785 Hyperlipidemia, unspecified: Secondary | ICD-10-CM

## 2016-08-04 DIAGNOSIS — R634 Abnormal weight loss: Secondary | ICD-10-CM

## 2016-08-04 DIAGNOSIS — K219 Gastro-esophageal reflux disease without esophagitis: Secondary | ICD-10-CM

## 2016-08-04 DIAGNOSIS — Z7952 Long term (current) use of systemic steroids: Secondary | ICD-10-CM

## 2016-08-04 DIAGNOSIS — M549 Dorsalgia, unspecified: Secondary | ICD-10-CM

## 2016-08-04 DIAGNOSIS — G8929 Other chronic pain: Secondary | ICD-10-CM

## 2016-08-04 DIAGNOSIS — N400 Enlarged prostate without lower urinary tract symptoms: Secondary | ICD-10-CM

## 2016-08-04 DIAGNOSIS — D500 Iron deficiency anemia secondary to blood loss (chronic): Secondary | ICD-10-CM

## 2016-08-04 LAB — BASIC METABOLIC PANEL
Anion gap: 3 — ABNORMAL LOW (ref 5–15)
BUN: 16 mg/dL (ref 6–20)
CO2: 29 mmol/L (ref 22–32)
Calcium: 9.2 mg/dL (ref 8.9–10.3)
Chloride: 108 mmol/L (ref 101–111)
Creatinine, Ser: 1.07 mg/dL (ref 0.61–1.24)
GFR calc Af Amer: >60 mL/min (ref >60)
GFR calc non Af Amer: >60 mL/min (ref >60)
Glucose, Bld: 105 mg/dL — ABNORMAL HIGH (ref 65–99)
Potassium: 4.4 mmol/L (ref 3.5–5.1)
Sodium: 140 mmol/L (ref 135–145)

## 2016-08-04 LAB — CBC WITH DIFFERENTIAL/PLATELET
Basophils Absolute: 0 10*3/uL (ref 0–0.1)
Basophils Relative: 0 %
Eosinophils Absolute: 0 10*3/uL (ref 0–0.7)
Eosinophils Relative: 1 %
HCT: 25.7 % — ABNORMAL LOW (ref 40.0–52.0)
Hemoglobin: 8.5 g/dL — ABNORMAL LOW (ref 13.0–18.0)
Lymphocytes Relative: 2 %
Lymphs Abs: 0.1 10*3/uL — ABNORMAL LOW (ref 1.0–3.6)
MCH: 28.9 pg (ref 26.0–34.0)
MCHC: 33.2 g/dL (ref 32.0–36.0)
MCV: 87 fL (ref 80.0–100.0)
Monocytes Absolute: 0.3 10*3/uL (ref 0.2–1.0)
Monocytes Relative: 4 %
Neutro Abs: 7.4 10*3/uL — ABNORMAL HIGH (ref 1.4–6.5)
Neutrophils Relative %: 93 %
Platelets: 54 10*3/uL — ABNORMAL LOW (ref 150–440)
RBC: 2.95 MIL/uL — ABNORMAL LOW (ref 4.40–5.90)
RDW: 17.9 % — ABNORMAL HIGH (ref 11.5–14.5)
WBC: 7.9 10*3/uL (ref 3.8–10.6)

## 2016-08-04 LAB — SAMPLE TO BLOOD BANK

## 2016-08-04 NOTE — Progress Notes (Signed)
Here for follow up . Feeling good . Wife concerned w weight.

## 2016-08-05 ENCOUNTER — Inpatient Hospital Stay: Payer: Medicare HMO

## 2016-08-05 ENCOUNTER — Other Ambulatory Visit: Payer: Self-pay | Admitting: Hematology and Oncology

## 2016-08-05 ENCOUNTER — Other Ambulatory Visit: Payer: Self-pay | Admitting: *Deleted

## 2016-08-05 DIAGNOSIS — T8699 Other complications of unspecified transplanted organ and tissue: Secondary | ICD-10-CM

## 2016-08-05 DIAGNOSIS — D47Z1 Post-transplant lymphoproliferative disorder (PTLD): Secondary | ICD-10-CM

## 2016-08-05 DIAGNOSIS — Z5111 Encounter for antineoplastic chemotherapy: Secondary | ICD-10-CM | POA: Diagnosis not present

## 2016-08-05 DIAGNOSIS — C8338 Diffuse large B-cell lymphoma, lymph nodes of multiple sites: Secondary | ICD-10-CM

## 2016-08-05 DIAGNOSIS — C7951 Secondary malignant neoplasm of bone: Secondary | ICD-10-CM

## 2016-08-05 DIAGNOSIS — C8333 Diffuse large B-cell lymphoma, intra-abdominal lymph nodes: Secondary | ICD-10-CM

## 2016-08-05 DIAGNOSIS — C787 Secondary malignant neoplasm of liver and intrahepatic bile duct: Secondary | ICD-10-CM

## 2016-08-05 LAB — SAMPLE TO BLOOD BANK

## 2016-08-05 MED ORDER — DENOSUMAB 120 MG/1.7ML ~~LOC~~ SOLN
120.0000 mg | Freq: Once | SUBCUTANEOUS | Status: AC
  Administered 2016-08-05: 120 mg via SUBCUTANEOUS
  Filled 2016-08-05: qty 1.7

## 2016-08-07 ENCOUNTER — Telehealth: Payer: Self-pay | Admitting: *Deleted

## 2016-08-07 MED ORDER — PANTOPRAZOLE SODIUM 40 MG PO TBEC: 40.0000 mg | DELAYED_RELEASE_TABLET | Freq: Two times a day (BID) | ORAL | 1 refills | Status: DC

## 2016-08-07 NOTE — Telephone Encounter (Signed)
Asking that Dr Mike Gip refill pantoprazole ordered by hospitalist when he was in the hospital last month.Please advise

## 2016-08-07 NOTE — Telephone Encounter (Signed)
  Ok to refill  M

## 2016-08-10 ENCOUNTER — Other Ambulatory Visit: Payer: Self-pay | Admitting: *Deleted

## 2016-08-10 ENCOUNTER — Telehealth: Payer: Self-pay | Admitting: *Deleted

## 2016-08-10 DIAGNOSIS — C8338 Diffuse large B-cell lymphoma, lymph nodes of multiple sites: Secondary | ICD-10-CM

## 2016-08-10 DIAGNOSIS — Z940 Kidney transplant status: Secondary | ICD-10-CM

## 2016-08-10 NOTE — Telephone Encounter (Signed)
Called to ask for the tacrolimus level to be done on next blood draw for his kidney transplant check up level.  Dr Mike Gip agreed to it and I entered it for 2/21 that is his next blood work. Called and left Hiram Comber a message that it will be added on.

## 2016-08-11 ENCOUNTER — Telehealth: Payer: Self-pay

## 2016-08-11 ENCOUNTER — Other Ambulatory Visit: Payer: Self-pay | Admitting: Hematology and Oncology

## 2016-08-11 NOTE — Telephone Encounter (Signed)
Nutrition Follow-up:  Attempted to call patient this pm for nutrition follow-up but unable to reach patient or leave voicemail.  Unable to reach patient on 1/18 as was admitted to the hospital.    Chart reviewed and noted on 2/12 visit with MD appetite was improving.     Anthropometrics:   Noted weight was 125 lb 15.9 oz on 2/12 visit increased from 122 lb on 1/4.     NEXT VISIT: as needed  Kayon Dozier B. Zenia Resides, [place], [place] (pager)

## 2016-08-12 ENCOUNTER — Telehealth: Payer: Self-pay | Admitting: *Deleted

## 2016-08-12 ENCOUNTER — Ambulatory Visit
Admission: RE | Admit: 2016-08-12 | Discharge: 2016-08-12 | Disposition: A | Discharge status: Home / Self Care | Payer: Medicare HMO | Source: Ambulatory Visit | Attending: Hematology and Oncology | Admitting: Hematology and Oncology

## 2016-08-12 DIAGNOSIS — C787 Secondary malignant neoplasm of liver and intrahepatic bile duct: Secondary | ICD-10-CM

## 2016-08-12 DIAGNOSIS — J90 Pleural effusion, not elsewhere classified: Secondary | ICD-10-CM | POA: Insufficient documentation

## 2016-08-12 DIAGNOSIS — J432 Centrilobular emphysema: Secondary | ICD-10-CM | POA: Diagnosis not present

## 2016-08-12 DIAGNOSIS — Z79899 Other long term (current) drug therapy: Secondary | ICD-10-CM | POA: Insufficient documentation

## 2016-08-12 DIAGNOSIS — C7951 Secondary malignant neoplasm of bone: Secondary | ICD-10-CM

## 2016-08-12 DIAGNOSIS — C8338 Diffuse large B-cell lymphoma, lymph nodes of multiple sites: Secondary | ICD-10-CM | POA: Diagnosis present

## 2016-08-12 LAB — GLUCOSE, CAPILLARY: Glucose-Capillary: 78 mg/dL (ref 65–99)

## 2016-08-12 MED ORDER — FLUDEOXYGLUCOSE F - 18 (FDG) INJECTION
12.2800 | Freq: Once | INTRAVENOUS | Status: AC | PRN
  Administered 2016-08-12: 12.28 via INTRAVENOUS

## 2016-08-12 NOTE — Telephone Encounter (Signed)
Attempted to call patient r/t lumbar puncture on Thursday, unable to leave message as patient does not have voice mail set up.  Patient has appt to see MD tomorrow 08-13-16 will inform about lp for 2-22 at that time.

## 2016-08-13 ENCOUNTER — Inpatient Hospital Stay: Payer: Medicare HMO | Attending: Hematology and Oncology

## 2016-08-13 ENCOUNTER — Inpatient Hospital Stay (HOSPITAL_BASED_OUTPATIENT_CLINIC_OR_DEPARTMENT_OTHER): Payer: Medicare HMO | Admitting: Hematology and Oncology

## 2016-08-13 ENCOUNTER — Encounter: Payer: Self-pay | Admitting: Hematology and Oncology

## 2016-08-13 ENCOUNTER — Other Ambulatory Visit: Payer: Self-pay | Admitting: *Deleted

## 2016-08-13 VITALS — BP 118/63 | HR 64 | Temp 98.6°F | Resp 18 | Wt 122.6 lb

## 2016-08-13 DIAGNOSIS — M549 Dorsalgia, unspecified: Secondary | ICD-10-CM

## 2016-08-13 DIAGNOSIS — Z7951 Long term (current) use of inhaled steroids: Secondary | ICD-10-CM

## 2016-08-13 DIAGNOSIS — I4891 Unspecified atrial fibrillation: Secondary | ICD-10-CM

## 2016-08-13 DIAGNOSIS — M1990 Unspecified osteoarthritis, unspecified site: Secondary | ICD-10-CM | POA: Insufficient documentation

## 2016-08-13 DIAGNOSIS — D47Z1 Post-transplant lymphoproliferative disorder (PTLD): Secondary | ICD-10-CM | POA: Insufficient documentation

## 2016-08-13 DIAGNOSIS — Z940 Kidney transplant status: Secondary | ICD-10-CM | POA: Insufficient documentation

## 2016-08-13 DIAGNOSIS — C787 Secondary malignant neoplasm of liver and intrahepatic bile duct: Secondary | ICD-10-CM

## 2016-08-13 DIAGNOSIS — M109 Gout, unspecified: Secondary | ICD-10-CM

## 2016-08-13 DIAGNOSIS — G8929 Other chronic pain: Secondary | ICD-10-CM | POA: Insufficient documentation

## 2016-08-13 DIAGNOSIS — N400 Enlarged prostate without lower urinary tract symptoms: Secondary | ICD-10-CM | POA: Insufficient documentation

## 2016-08-13 DIAGNOSIS — Z9221 Personal history of antineoplastic chemotherapy: Secondary | ICD-10-CM

## 2016-08-13 DIAGNOSIS — E785 Hyperlipidemia, unspecified: Secondary | ICD-10-CM | POA: Insufficient documentation

## 2016-08-13 DIAGNOSIS — Z79899 Other long term (current) drug therapy: Secondary | ICD-10-CM

## 2016-08-13 DIAGNOSIS — I272 Pulmonary hypertension, unspecified: Secondary | ICD-10-CM | POA: Insufficient documentation

## 2016-08-13 DIAGNOSIS — E210 Primary hyperparathyroidism: Secondary | ICD-10-CM | POA: Insufficient documentation

## 2016-08-13 DIAGNOSIS — K219 Gastro-esophageal reflux disease without esophagitis: Secondary | ICD-10-CM

## 2016-08-13 DIAGNOSIS — Z87891 Personal history of nicotine dependence: Secondary | ICD-10-CM

## 2016-08-13 DIAGNOSIS — N186 End stage renal disease: Secondary | ICD-10-CM | POA: Diagnosis not present

## 2016-08-13 DIAGNOSIS — Z7952 Long term (current) use of systemic steroids: Secondary | ICD-10-CM | POA: Insufficient documentation

## 2016-08-13 DIAGNOSIS — R634 Abnormal weight loss: Secondary | ICD-10-CM

## 2016-08-13 DIAGNOSIS — C7951 Secondary malignant neoplasm of bone: Secondary | ICD-10-CM

## 2016-08-13 DIAGNOSIS — Z8719 Personal history of other diseases of the digestive system: Secondary | ICD-10-CM

## 2016-08-13 DIAGNOSIS — C8330 Diffuse large B-cell lymphoma, unspecified site: Secondary | ICD-10-CM

## 2016-08-13 DIAGNOSIS — C8338 Diffuse large B-cell lymphoma, lymph nodes of multiple sites: Secondary | ICD-10-CM | POA: Insufficient documentation

## 2016-08-13 DIAGNOSIS — Z5111 Encounter for antineoplastic chemotherapy: Secondary | ICD-10-CM | POA: Diagnosis not present

## 2016-08-13 DIAGNOSIS — N529 Male erectile dysfunction, unspecified: Secondary | ICD-10-CM | POA: Insufficient documentation

## 2016-08-13 DIAGNOSIS — I129 Hypertensive chronic kidney disease with stage 1 through stage 4 chronic kidney disease, or unspecified chronic kidney disease: Secondary | ICD-10-CM

## 2016-08-13 DIAGNOSIS — E042 Nontoxic multinodular goiter: Secondary | ICD-10-CM | POA: Insufficient documentation

## 2016-08-13 DIAGNOSIS — C8339 Diffuse large B-cell lymphoma, extranodal and solid organ sites: Secondary | ICD-10-CM

## 2016-08-13 LAB — COMPREHENSIVE METABOLIC PANEL
ALT: 11 U/L — ABNORMAL LOW (ref 17–63)
AST: 17 U/L (ref 15–41)
Albumin: 3.5 g/dL (ref 3.5–5.0)
Alkaline Phosphatase: 83 U/L (ref 38–126)
Anion gap: 6 (ref 5–15)
BUN: 17 mg/dL (ref 6–20)
CO2: 26 mmol/L (ref 22–32)
Calcium: 9.3 mg/dL (ref 8.9–10.3)
Chloride: 106 mmol/L (ref 101–111)
Creatinine, Ser: 1.1 mg/dL (ref 0.61–1.24)
GFR calc Af Amer: >60 mL/min (ref >60)
GFR calc non Af Amer: >60 mL/min (ref >60)
Glucose, Bld: 90 mg/dL (ref 65–99)
Potassium: 4.3 mmol/L (ref 3.5–5.1)
Sodium: 138 mmol/L (ref 135–145)
Total Bilirubin: 0.5 mg/dL (ref 0.3–1.2)
Total Protein: 5.7 g/dL — ABNORMAL LOW (ref 6.5–8.1)

## 2016-08-13 LAB — CBC WITH DIFFERENTIAL/PLATELET
Basophils Absolute: 0 10*3/uL (ref 0–0.1)
Basophils Relative: 0 %
Eosinophils Absolute: 0 10*3/uL (ref 0–0.7)
Eosinophils Relative: 0 %
HCT: 28.3 % — ABNORMAL LOW (ref 40.0–52.0)
Hemoglobin: 9.1 g/dL — ABNORMAL LOW (ref 13.0–18.0)
Lymphocytes Relative: 4 %
Lymphs Abs: 0.3 10*3/uL — ABNORMAL LOW (ref 1.0–3.6)
MCH: 28.4 pg (ref 26.0–34.0)
MCHC: 32.2 g/dL (ref 32.0–36.0)
MCV: 88.3 fL (ref 80.0–100.0)
Monocytes Absolute: 0.5 10*3/uL (ref 0.2–1.0)
Monocytes Relative: 5 %
Neutro Abs: 7.7 10*3/uL — ABNORMAL HIGH (ref 1.4–6.5)
Neutrophils Relative %: 91 %
Platelets: 172 10*3/uL (ref 150–440)
RBC: 3.2 MIL/uL — ABNORMAL LOW (ref 4.40–5.90)
RDW: 17.9 % — ABNORMAL HIGH (ref 11.5–14.5)
WBC: 8.5 10*3/uL (ref 3.8–10.6)

## 2016-08-13 LAB — TSH: TSH: 1.145 u[IU]/mL (ref 0.350–4.500)

## 2016-08-13 LAB — URIC ACID: Uric Acid, Serum: 5.9 mg/dL (ref 4.4–7.6)

## 2016-08-13 LAB — PROTIME-INR
INR: 1.1
Prothrombin Time: 14.3 seconds (ref 11.4–15.2)

## 2016-08-13 LAB — LACTATE DEHYDROGENASE: LDH: 131 U/L (ref 98–192)

## 2016-08-13 LAB — APTT: aPTT: 30 seconds (ref 24–36)

## 2016-08-13 NOTE — Progress Notes (Signed)
Patient states his feet have turned dark on the top.

## 2016-08-13 NOTE — Progress Notes (Signed)
[facility] day:  08/13/2016   Chief Complaint: Matthew Brown is a 76 y.o. male with post-transplant lymphoproliferative disorder, stage IVBE diffuse large B cell lymphoma, who is seen for assessment on the day prior to lumbar puncture and cycle #5 mini-RCHOP.  HPI:  The patient was last seen in the medical oncology clinic on 08/04/2016.  At that time, he was eating better.  He had gained 2 pounds.  He received Xgeva the next day because of the lateness of the hour.  Restaging studies were ordered.  PET scan on 08/12/2016 revealed interval response to therapy. There has been significant decrease in extent of hypermetabolic tumor within the neck, chest, abdomen and pelvis as well as the axial and appendicular skeleton.  There was residual enlarged and hypermetabolic small bowel mesenteric and periaortic lymph nodes identified. There was a persistent hypermetabolic focus of increased uptake within the spleen.  There was resolution of previous multifocal hypermetabolic lesions within the liver.  Symptomatically, he feels great.  He denies any B symptoms.  He has no adenopathy.  He is active.  He eats a lot, but is not gaining weight.  He notes that the top of his left foot is darker.  He has some nail changes.   Past Medical History:  Diagnosis Date  . Benign prostatic hypertrophy   . Chronic headache 10/19/2015  . ED (erectile dysfunction)   . End stage renal disease ([place])   . Essential hypertension   . GERD (gastroesophageal reflux disease)   . GIB (gastrointestinal bleeding)    a. 0/8144 s/p R colic artery embolization;  b. 02/2015 EGD: duod ulcerative mass->Bx notable for coagulative necrosis - ? ischemia vs thrombosis-->coumadin d/c'd.  . Gout   . Hearing loss   . Hemorrhoids   . Hyperlipidemia   . Lymphoma ([place])   . Lymphoma ([place]) 2017  . Multiple thyroid nodules 06/06/2016   Noted on carotid US; dedicated US to be ordered by staff  .  Osteoarthrosis, unspecified whether generalized or localized, lower leg   . Persistent atrial fibrillation ([place])    a. CHA2DS2VASc = 3-->coumadin d/c'd 02/2015 2/2 recurrent GIB.  [name] Prostatitis   . Pulmonary hypertension    a. 10/2014 Echo: EF 60-65%, mild to mod MR, mildly dil LA, nl RV, PASP 28mHg.  .Marland KitchenRenal transplant recipient   . Ulcers of both great toes ([facility]     Past Surgical History:  Procedure Laterality Date  . BACK SURGERY    . ESOPHAGOGASTRODUODENOSCOPY  03/13/15   severe esophagitis, ulcerated mass  . ESOPHAGOGASTRODUODENOSCOPY (EGD) WITH PROPOFOL N/A 07/09/2016   Procedure: ESOPHAGOGASTRODUODENOSCOPY (EGD) WITH PROPOFOL;  Surgeon: KJonathon Bellows MD;  Location: [facility] ENDOSCOPY;  Service: Endoscopy;  Laterality: N/A;  . HERNIA REPAIR  1974  . PERIPHERAL VASCULAR CATHETERIZATION N/A 05/07/2016   Procedure: PGlori LuisCath Insertion;  Surgeon: JAlgernon Huxley MD;  [facility];  Service: Cardiovascular;  Laterality: N/A;  . PROSTATE ABLATION    . STOMACH SURGERY     blood vessel burst  . THROAT SURGERY    . TOTAL KNEE ARTHROPLASTY      Family History  Problem Relation Age of Onset  . Cancer Mother     throat  . Diabetes Brother   . Heart disease Brother   . Stroke Brother   . Hypertension Brother   . Diabetes Sister   . Heart disease Sister   . Hypertension Sister   . Diabetes  Sister   . Diabetes Brother   . COPD Neg Hx   . Kidney disease Neg Hx   . Prostate cancer Neg Hx     Social History:  reports that he quit smoking about 38 years ago. His smoking use included Cigarettes. He has a 25.00 pack-year smoking history. He has never used smokeless tobacco. He reports that he does not drink alcohol or use drugs.  He stopped smoking in 1981.  He smoked 3 cigarettes/day.  He lives in [place].  The patient is accompanied by his wife, Matthew Brown,  today.  Allergies: No Known Allergies  Current Medications: Current Outpatient Prescriptions  Medication Sig Dispense  Refill  . acetaminophen (TYLENOL) 325 MG tablet Take 2 tablets (650 mg total) by mouth every 6 (six) hours as needed for mild pain (or Fever >/= 101).    [name] albuterol (PROAIR HFA) 108 (90 BASE) MCG/ACT inhaler Inhale 1-2 puffs into the lungs every 4 (four) hours as needed.     [name] allopurinol (ZYLOPRIM) 100 MG tablet Take 100 mg by mouth daily.      [name] COLCRYS 0.6 MG tablet Take 1 tablet by mouth 2 (two) times daily as needed.    . diphenhydrAMINE (BENADRYL) 25 mg capsule Take 1 capsule (25 mg total) by mouth at bedtime as needed for sleep. 30 capsule 0  . feeding supplement, ENSURE ENLIVE, (ENSURE ENLIVE) LIQD Take 237 mLs by mouth 3 (three) times daily between meals. 90 Bottle 0  . finasteride (PROSCAR) 5 MG tablet Take 5 mg by mouth daily.      . furosemide (LASIX) 20 MG tablet Take 20 mg by mouth daily. Take 1-2 tablets daily.    [name] HYDROcodone-acetaminophen (NORCO) 5-325 MG tablet Take 1 tablet by mouth every 4 (four) hours as needed for moderate pain. 30 tablet 0  . hydroxypropyl methylcellulose (ISOPTO TEARS) 2.5 % ophthalmic solution Place 1 drop into both eyes as needed.     . Multiple Vitamin (MULTIVITAMIN) tablet Take 1 tablet by mouth daily.      . ondansetron (ZOFRAN) 4 MG tablet Take 1 tablet (4 mg total) by mouth every 6 (six) hours as needed for nausea. 20 tablet 0  . oxybutynin (DITROPAN-XL) 5 MG 24 hr tablet Take 5 mg by mouth daily.    [name] oxyCODONE (OXY IR/ROXICODONE) 5 MG immediate release tablet     . oxyCODONE-acetaminophen (ROXICET) 5-325 MG tablet Take 1 tablet by mouth every 6 (six) hours as needed for severe pain. 20 tablet 0  . pantoprazole (PROTONIX) 40 MG tablet Take 1 tablet (40 mg total) by mouth 2 (two) times daily. 60 tablet 1  . predniSONE (DELTASONE) 20 MG tablet     . predniSONE (DELTASONE) 5 MG tablet Take 5 mg by mouth daily.     . simethicone (MYLICON) 80 MG chewable tablet Chew 1 tablet (80 mg total) by mouth every 6 (six) hours as needed for flatulence. 120  tablet 0  . sucralfate (CARAFATE) 1 g tablet Take 1 tablet (1 g total) by mouth 4 (four) times daily. Resume taking after one week- once finished taking oral levaquine. ( to avoid interaction.) 40 tablet 3  . tacrolimus (PROGRAF) 1 MG capsule Take 3 mg by mouth 2 (two) times daily. Reported on 08/16/2015    . Tamsulosin HCl (FLOMAX) 0.4 MG CAPS Take 0.4 mg by mouth daily.      . traZODone (DESYREL) 50 MG tablet 1-2 TABS AS NEEDED FOR SLEEP  3   No current  facility-administered medications for this visit.    Facility-Administered Medications Ordered in Other Visits  Medication Dose Route Frequency Provider Last Rate Last Dose  . HYDROcodone-acetaminophen (NORCO/VICODIN) 5-325 MG per tablet 1 tablet  1 tablet Oral Once Lequita Asal, MD      . methotrexate (PF) 12 mg in sodium chloride 0.9 % INTRATHECAL chemo injection   Intrathecal Once Lequita Asal, MD        Review of Systems:  GENERAL:  Feels "great".  No fevers or sweats.  Weight down 3 pounds. PERFORMANCE STATUS (ECOG):  1 HEENT:  No visual changes, sore throat, mouth sores or tenderness. Lungs: No shortness of breath or cough.  No hemoptysis. Cardiac:  No chest pain, palpitations, orthopnea, or PND. GI:  Appetite great.  Eating well.  No nausea, vomiting, diarrhea, constipation, melena or hematochezia. GU:  Enlarged prostate.  No urgency, frequency, dysuria, or hematuria. Musculoskeletal:  Back pain.  No joint pain.  No muscle tenderness. Extremities:  No pain or swelling. Skin:  Top of right foot getting dark.  Nail changes.  No rashes or skin changes. Neuro:  Chronic headaches.  No numbness or weakness, balance or coordination issues. Endocrine:  No diabetes, thyroid issues, hot flashes or night sweats. Psych:  No mood changes, depression or anxiety. Pain:  No pain. Review of systems:  All other systems reviewed and found to be negative.  Physical Exam: Blood pressure 118/63, pulse 64, temperature 98.6 F (37 C),  temperature source Tympanic, resp. rate 18, weight 122 lb 9.2 oz (55.6 kg). GENERAL:  Thin elderly gentleman sitting comfortably in a wheelchair in the exam room in no acute distress. MENTAL STATUS:  Alert and oriented to person, place and time. HEAD:  Wearing a cap.  Lu Duffel.  Temporal wasting.  Normocephalic, atraumatic, face symmetric, no Cushingoid features. EYES:  Glasses.  Brown eyes.  Pupils equal round and reactive to light and accomodation.  No conjunctivitis or scleral icterus. ENT:  Oropharynx clear without lesion.  Edentulous.  Tongue normal. Mucous membranes moist.  RESPIRATORY:  Clear to auscultation without rales, wheezes or rhonchi. CARDIOVASCULAR:  Regular rate and rhythm without murmur, rub or gallop. ABDOMEN:  Soft, non-tender, with active bowel sounds, and no hepatosplenomegaly.  No masses.   SKIN:  Dorsum of right foot with hyperpigmentation.  Skin dry.  No erythema or induration.  Slight pigmentation dorsum of left foot.  Pulses intact.  capillary refill normal.  No rashes, ulcers or lesions. EXTREMITIES:  No edema, no skin discoloration or tenderness.  No palpable cords. NEUROLOGICAL: Unremarkable. PSYCH:  Appropriate.    Appointment on 08/13/2016  Component Date Value Ref Range Status  . WBC 08/13/2016 8.5  3.8 - 10.6 K/uL Final  . RBC 08/13/2016 3.20* 4.40 - 5.90 MIL/uL Final  . Hemoglobin 08/13/2016 9.1* 13.0 - 18.0 g/dL Final  . HCT 08/13/2016 28.3* 40.0 - 52.0 % Final  . MCV 08/13/2016 88.3  80.0 - 100.0 fL Final  . MCH 08/13/2016 28.4  26.0 - 34.0 pg Final  . MCHC 08/13/2016 32.2  32.0 - 36.0 g/dL Final  . RDW 08/13/2016 17.9* 11.5 - 14.5 % Final  . Platelets 08/13/2016 172  150 - 440 K/uL Final  . Neutrophils Relative % 08/13/2016 91  % Final  . Neutro Abs 08/13/2016 7.7* 1.4 - 6.5 K/uL Final  . Lymphocytes Relative 08/13/2016 4  % Final  . Lymphs Abs 08/13/2016 0.3* 1.0 - 3.6 K/uL Final  . Monocytes Relative 08/13/2016 5  %  Final  . Monocytes Absolute  08/13/2016 0.5  0.2 - 1.0 K/uL Final  . Eosinophils Relative 08/13/2016 0  % Final  . Eosinophils Absolute 08/13/2016 0.0  0 - 0.7 K/uL Final  . Basophils Relative 08/13/2016 0  % Final  . Basophils Absolute 08/13/2016 0.0  0 - 0.1 K/uL Final  . Sodium 08/13/2016 138  135 - 145 mmol/L Final  . Potassium 08/13/2016 4.3  3.5 - 5.1 mmol/L Final  . Chloride 08/13/2016 106  101 - 111 mmol/L Final  . CO2 08/13/2016 26  22 - 32 mmol/L Final  . Glucose, Bld 08/13/2016 90  65 - 99 mg/dL Final  . BUN 08/13/2016 17  6 - 20 mg/dL Final  . Creatinine, Ser 08/13/2016 1.10  0.61 - 1.24 mg/dL Final  . Calcium 08/13/2016 9.3  8.9 - 10.3 mg/dL Final  . Total Protein 08/13/2016 5.7* 6.5 - 8.1 g/dL Final  . Albumin 08/13/2016 3.5  3.5 - 5.0 g/dL Final  . AST 08/13/2016 17  15 - 41 U/L Final  . ALT 08/13/2016 11* 17 - 63 U/L Final  . Alkaline Phosphatase 08/13/2016 83  38 - 126 U/L Final  . Total Bilirubin 08/13/2016 0.5  0.3 - 1.2 mg/dL Final  . GFR calc non Af Amer 08/13/2016 >60  >60 mL/min Final  . GFR calc Af Amer 08/13/2016 >60  >60 mL/min Final   Comment: (NOTE) The eGFR has been calculated using the CKD EPI equation. This calculation has not been validated in all clinical situations. eGFR's persistently <60 mL/min signify possible Chronic Kidney Disease.   . Anion gap 08/13/2016 6  5 - 15 Final  . LDH 08/13/2016 131  98 - 192 U/L Final  . Uric Acid, Serum 08/13/2016 5.9  4.4 - 7.6 mg/dL Final  . Prothrombin Time 08/13/2016 14.3  11.4 - 15.2 seconds Final  . INR 08/13/2016 1.10   Final  Hospital Outpatient Visit on 08/12/2016  Component Date Value Ref Range Status  . Glucose-Capillary 08/12/2016 78  65 - 99 mg/dL Final    Assessment:  Matthew Brown is a 76 y.o. male s/p renal transplant (2007) with a post-transplant lymphoproliferative disorder, stage IV diffuse large B cell lymphoma.  He presented with a 2-3 month history of progressive back pain superimposed on chronic back pain.    PET  scan on 04/28/2016 revealed bulky intensely hypermetabolic periaortic upper abdominal and mesenteric adenopathy concerning for high-grade lymphoma.  There was hypermetabolic liver metastasis.  There was hpermetabolic lesion involving the small bowel of the upper pelvis.  There were multiple sites of hypermetabolic skeletal metastasis. There was moderate volume right pneumothorax.  CT guided retroperitoneal node biopsy on 04/30/2016 revealed diffuse large B cell lymphoma.  Hepatitis B and C testing on 05/06/2016 were negative.  Echo on 05/06/2016 revealed an EF of 55-60%.  Bone marrow aspirate and biopsy on 05/08/2016 revealed multifocal marrow involvement by diffuse large B-cell lymphoma. There was variably cellular marrow for age (50% - 90%) with a patchy predominantly nodular large B-cell infiltrate, overall estimated to account for 20% of the core biopsy. There was adequate residual trilineage hematopoiesis with mild nonspecific dyserythropoiesis. There was patchy mild increase in reticulin. Storage iron was present. The immunohistochemical staining pattern of the B-cell infiltrate (CD10 +/-, BCL 6+, BCL-2 +) suggested possible large cell transformation of follicular lymphoma.  Flow cytometry revealed no significant immunophenotypic abnormalities or evidence of B-cell lymphoma.  He has bone metastasis and hypercalcemia.  Calcium was 11.2 (ionized 7.3)  on 05/13/2016.  He received Zometa on 05/13/2016.  He began [place] on 06/09/2016 (last 08/05/2016).  Work-up on 04/15/2016 revealed the following normal studies: ferritin (343), iron saturation (6%), TIBC (241; low), B12 (525), folate (27).  Reticulocyte count was 2%.  LDH was 409.  Uric acid was 7.7 (4.4 - 7.6).  He was admitted at [facility] from 04/28/2016 - 04/30/2016 with a moderate volume right sided pneumothorax.  His pneumothorax improved spontaneously. Plain films of the right femur revealed the lucent bone lesion in the right femoral neck  was poorly  characterized.  There was no evidence for an acute fracture.  PTH was 106 (high) 04/30/2016 with a calcium of 11.2.  Etiology was c/w primary hyperparathyroidism.  PTH-related polypeptide was < 1.1 on 04/30/2016.  He has a history of GI bleeding in 08/2014.  He underwent tagged RBC scan which revealed an active bleed in the hepatic flexure.  He was embolized in vascular interventional radiology.  He was admitted to [facility] from 07/08/2016 - 07/13/2016 with GI bleeding.  EGD on 07/09/2016 revealed multiple non-bleeding duodenal ulcer as well as duodenitis. Tagged RBC scan on 07/11/2016 revealed active GI bleed in the lateral right mid abdomen coursing through multiple curvilinear bowel loops in the right mid abdomen favoring a small bowel source of bleeding.  He received 5 units of PRBCs, 2 units of pheresed platelets, and vitamin K from 07/07/2016 0 07/13/2016.  He was transferred to [facility] from 07/13/2016 - 07/16/2016.  [facility] performed a colonoscopy and push enteroscopy which showed several diverticula and old blood but no source of bleeding. Source of bleed was presumed to be diverticular, although it was possible a small bowel site was not visualized on endoscopy.  He has a history of renal failure s/p renal transplant.  He is tacrolimus (Prograf), and steroids  Creatinine has ranged between 1.43 - 1.93 in the past 6 months. Mycophenolate (MMF) was discontinued at diagnosis.  Prograf level was 5.5 (3.0-8.0) on 05/08/2016.   He is currently day 20 of cycle #4 mini-RCHOP (05/09/2016 - 07/25/2016).  Cycle #1 was complicated by fever and neutropenia.  All cultures were negative.  He was treated empirically with Cefepime.  Peripheral smear revealed schistocytes.  Cycle #3 was complicated by a GI bleed.  PET scan on 08/12/2016 revealed interval response to therapy. There has been significant decrease in extent of hypermetabolic tumor within the neck, chest, abdomen and pelvis as well as the axial and  appendicular skeleton.  There was residual enlarged and hypermetabolic small bowel mesenteric and periaortic lymph nodes. There was a persistent hypermetabolic focus of increased uptake within the spleen.  There was resolution of previous multifocal hypermetabolic lesions within the liver.  CSF on 06/19/2016 revealed 7 WBCs (4% segs, 79& lymphs, and 17% monocytes).  He received his first LP with IT MTX on 07/24/2016.  Cytology was negative.  Symptomatically, he continues to eat but not gain weight.  He denies any fever or sweats.  He denies any GI bleeding.  Exam reveals hyperpigmentation on the dorsum of his feet (right > left).  Plan: 1.  Labs today:  CBC with diff, CMP, LDH, uric acid, PT, PTT, tacrolimus level. 2.  Review PET scan.  PET scan reveals.  Review excellent response to therapy.  Discuss plan for 2 additional cycles then reimaging. 3.  Lumbar puncture with IT MTX on 08/14/2016. 4.  Remind patient to take steroids x 5 days with chemotherapy. 5.  RTC on 08/15/2016 for cycle #5 mini-RCHOP today  with OnPro Neulasta. 6.  RTC on 09/02/2016 for MD assessment, labs (CBC, BMP), and Xgeva.  Addendum:  Schedule echo to assess EF prior to last cycle of mini-RCHOP.   Lequita Asal, MD  08/13/2016, 12:45 PM

## 2016-08-14 ENCOUNTER — Encounter: Payer: Self-pay | Admitting: Hematology and Oncology

## 2016-08-14 ENCOUNTER — Encounter: Payer: Self-pay | Admitting: *Deleted

## 2016-08-14 ENCOUNTER — Telehealth: Payer: Self-pay | Admitting: Hematology and Oncology

## 2016-08-14 ENCOUNTER — Ambulatory Visit
Admission: RE | Admit: 2016-08-14 | Discharge: 2016-08-14 | Disposition: A | Discharge status: Home / Self Care | Payer: Medicare HMO | Source: Ambulatory Visit | Attending: Hematology and Oncology | Admitting: Hematology and Oncology

## 2016-08-14 DIAGNOSIS — Z5111 Encounter for antineoplastic chemotherapy: Secondary | ICD-10-CM | POA: Diagnosis not present

## 2016-08-14 DIAGNOSIS — C8333 Diffuse large B-cell lymphoma, intra-abdominal lymph nodes: Secondary | ICD-10-CM | POA: Insufficient documentation

## 2016-08-14 DIAGNOSIS — Z981 Arthrodesis status: Secondary | ICD-10-CM | POA: Insufficient documentation

## 2016-08-14 LAB — CSF CELL COUNT WITH DIFFERENTIAL
RBC Count, CSF: 68 /mm3 — ABNORMAL HIGH (ref 0–3)
Tube #: 4
WBC, CSF: 0 /mm3 (ref 0–5)

## 2016-08-14 LAB — PROTEIN, CSF: Total  Protein, CSF: 36 mg/dL (ref 15–45)

## 2016-08-14 LAB — GLUCOSE, CSF: Glucose, CSF: 54 mg/dL (ref 40–70)

## 2016-08-14 MED ORDER — LIDOCAINE HCL (PF) 1 % IJ SOLN
5.0000 mL | Freq: Once | INTRAMUSCULAR | Status: DC
  Filled 2016-08-14: qty 5

## 2016-08-14 MED ORDER — SODIUM CHLORIDE 0.9 % IJ SOLN
Freq: Once | INTRAMUSCULAR | Status: DC
  Filled 2016-08-14: qty 0.48

## 2016-08-14 NOTE — Procedures (Signed)
Mr. Viars is a 76 yo male who has recent diagnosis of B cell Lymphoma. He is a patient of Dr. Kem Parkinson. Due to difficulty obtaining spinal fluid due to his prior lumbar fusion, I was asked to perform access for injection of methotrexate.  I reviewed the risks and benefits with Mr. Cachu.   Procedure:  Mr. Mclaney was placed in the left latera decubitus position. A time out was performed. The flouroscope was used to identify the L2-3 interspace. 4 ml of local anesthetic was injected. The area was prepped and draped prior to that.  After numbing, a spinal needle was used to access the L2-3 interspace and confirmed with flouroscopy. Clear egress of clear cerebrospinal fluid was noted.   4 ml of clear spinal fluid was collected sterilely and sent for studies per Dr. Mike Gip. Dr. Mike Gip then administered intrathecal methotrexate. She removed the needle after she was finished.  Pressure was held on the exit site, then a bandaid placed.  No immediate complications were noted. All sharps were accounted for.  EBL: minimal IVF: none

## 2016-08-14 NOTE — Progress Notes (Signed)
  Date:  08/14/2016  Procedure:  Lumbar puncture with intrathecal methotrexate.  Indication:  Diffuse large B cell lymphoma for CSF prophylaxis.  Consent:  Obtained.  Procedure:  The patient underwent lumbar puncture by Dr. Cari Caraway.  The patient was placed in the left lateral decubitus position. Flouroscopy was used by Dr. Cari Caraway to confirm level. The patient was prepped and draped.  Local anesthesia was infiltrated into skin. A 20 gauge needle was used to access the CSF. 5 cc of clear colorless CSF was obtained.    Under sterile technique, methotrexate 12 mg in preservative free saline (total volume 3 cc) was instilled slowly over 1 minute.  The lumbar puncture needle was then removed slowly.  A small bandage was placed.  The patient tolerated the procedure well.  CSF was sent for cell count and diff, glucose, protein, and cytology.  Complications:  None.   Lequita Asal, MD

## 2016-08-15 ENCOUNTER — Inpatient Hospital Stay: Payer: Medicare HMO

## 2016-08-15 ENCOUNTER — Other Ambulatory Visit: Payer: Self-pay | Admitting: Hematology and Oncology

## 2016-08-15 DIAGNOSIS — C8339 Diffuse large B-cell lymphoma, extranodal and solid organ sites: Secondary | ICD-10-CM

## 2016-08-15 DIAGNOSIS — C8338 Diffuse large B-cell lymphoma, lymph nodes of multiple sites: Secondary | ICD-10-CM

## 2016-08-15 DIAGNOSIS — Z5111 Encounter for antineoplastic chemotherapy: Secondary | ICD-10-CM | POA: Diagnosis not present

## 2016-08-15 LAB — TACROLIMUS LEVEL: Tacrolimus (FK506) - LabCorp: 2.2 ng/mL (ref 2.0–20.0)

## 2016-08-15 MED ORDER — VINCRISTINE SULFATE CHEMO INJECTION 1 MG/ML
1.0000 mg | Freq: Once | INTRAVENOUS | Status: AC
  Administered 2016-08-15: 1 mg via INTRAVENOUS
  Filled 2016-08-15: qty 1

## 2016-08-15 MED ORDER — SODIUM CHLORIDE 0.9 % IV SOLN
375.0000 mg/m2 | Freq: Once | INTRAVENOUS | Status: AC
  Administered 2016-08-15: 600 mg via INTRAVENOUS
  Filled 2016-08-15: qty 50

## 2016-08-15 MED ORDER — PEGFILGRASTIM 6 MG/0.6ML ~~LOC~~ PSKT
6.0000 mg | PREFILLED_SYRINGE | Freq: Once | SUBCUTANEOUS | Status: AC
  Administered 2016-08-15: 6 mg via SUBCUTANEOUS
  Filled 2016-08-15: qty 0.6

## 2016-08-15 MED ORDER — ACETAMINOPHEN 325 MG PO TABS
650.0000 mg | ORAL_TABLET | Freq: Once | ORAL | Status: AC
  Administered 2016-08-15: 650 mg via ORAL
  Filled 2016-08-15: qty 2

## 2016-08-15 MED ORDER — DOXORUBICIN HCL CHEMO IV INJECTION 2 MG/ML
25.0000 mg/m2 | Freq: Once | INTRAVENOUS | Status: AC
  Administered 2016-08-15: 42 mg via INTRAVENOUS
  Filled 2016-08-15: qty 30
  Filled 2016-08-15: qty 21

## 2016-08-15 MED ORDER — PALONOSETRON HCL INJECTION 0.25 MG/5ML
0.2500 mg | Freq: Once | INTRAVENOUS | Status: AC
  Administered 2016-08-15: 0.25 mg via INTRAVENOUS
  Filled 2016-08-15: qty 5

## 2016-08-15 MED ORDER — HEPARIN SOD (PORK) LOCK FLUSH 100 UNIT/ML IV SOLN
500.0000 [IU] | Freq: Once | INTRAVENOUS | Status: AC | PRN
  Administered 2016-08-15: 500 [IU]

## 2016-08-15 MED ORDER — DIPHENHYDRAMINE HCL 25 MG PO CAPS
50.0000 mg | ORAL_CAPSULE | Freq: Once | ORAL | Status: AC
  Administered 2016-08-15: 50 mg via ORAL
  Filled 2016-08-15: qty 2

## 2016-08-15 MED ORDER — SODIUM CHLORIDE 0.9 % IV SOLN
Freq: Once | INTRAVENOUS | Status: AC
  Administered 2016-08-15: 11:00:00 via INTRAVENOUS
  Filled 2016-08-15: qty 1000

## 2016-08-15 MED ORDER — DEXAMETHASONE SODIUM PHOSPHATE 10 MG/ML IJ SOLN
10.0000 mg | Freq: Once | INTRAMUSCULAR | Status: AC
  Administered 2016-08-15: 10 mg via INTRAVENOUS
  Filled 2016-08-15: qty 1

## 2016-08-15 MED ORDER — SODIUM CHLORIDE 0.9 % IV SOLN
400.0000 mg/m2 | Freq: Once | INTRAVENOUS | Status: AC
  Administered 2016-08-15: 660 mg via INTRAVENOUS
  Filled 2016-08-15: qty 33

## 2016-08-15 NOTE — Telephone Encounter (Signed)
Entered in error

## 2016-08-16 ENCOUNTER — Encounter: Payer: Self-pay | Admitting: Hematology and Oncology

## 2016-08-27 ENCOUNTER — Other Ambulatory Visit: Payer: Self-pay | Admitting: Hematology and Oncology

## 2016-08-27 ENCOUNTER — Other Ambulatory Visit: Payer: Self-pay | Admitting: *Deleted

## 2016-08-27 ENCOUNTER — Telehealth: Payer: Self-pay | Admitting: *Deleted

## 2016-08-27 DIAGNOSIS — C8330 Diffuse large B-cell lymphoma, unspecified site: Secondary | ICD-10-CM

## 2016-08-27 DIAGNOSIS — C8333 Diffuse large B-cell lymphoma, intra-abdominal lymph nodes: Secondary | ICD-10-CM

## 2016-08-27 DIAGNOSIS — C8338 Diffuse large B-cell lymphoma, lymph nodes of multiple sites: Secondary | ICD-10-CM

## 2016-08-28 ENCOUNTER — Other Ambulatory Visit: Payer: Self-pay | Admitting: *Deleted

## 2016-08-28 ENCOUNTER — Telehealth: Payer: Self-pay | Admitting: *Deleted

## 2016-08-28 ENCOUNTER — Other Ambulatory Visit: Payer: Self-pay | Admitting: Hematology and Oncology

## 2016-08-28 DIAGNOSIS — C8330 Diffuse large B-cell lymphoma, unspecified site: Secondary | ICD-10-CM

## 2016-08-28 NOTE — Telephone Encounter (Signed)
Requesting refill of his Oxycodone for pain in the buttuck muscle. Also needs a copy of his appts

## 2016-08-28 NOTE — Telephone Encounter (Signed)
Called and spoke with Matthew Brown about the lumbar puncture on Thursday 3-15 at 0800 [place] [facility] [place] and to be NPO after night, voiced understanding.

## 2016-08-28 NOTE — Telephone Encounter (Signed)
Per Dr Mike Gip, she does not want to giver narcotics unless he is having cancer pain; he can take Tylenol and use a heating pad for his buttock pain. She also stated that he cannot have 2 providers writing for narcotics.  I called Lucretia back and explained what Dr Mike Gip said she she acknowledged what was said and thanked me for calling back. She asked where she can pick up a list of his upcoming appts, I told her they will be at the front desk in an envelope

## 2016-08-29 NOTE — Telephone Encounter (Signed)
error 

## 2016-08-31 DIAGNOSIS — Z7189 Other specified counseling: Secondary | ICD-10-CM | POA: Insufficient documentation

## 2016-08-31 NOTE — Progress Notes (Signed)
Cardiology Office Note  Date:  09/01/2016   ID:  [name], DOB 09-27-1940, MRN [id number]  PCP:  Matthew Derry, MD   Chief Complaint  Patient presents with  . other    OD 6 month f/u no complaints today . Meds reviewed verbally with pt.    HPI:  [name] is a 76 year old male with a history of chronic atrial fibrillation and flutter with a normal ejection fraction,   negative functional study in [place] in 2006, end-stage renal disease status post kidney transplant in May 2006, with normal creatinine, hypertension, and gastroesophageal reflux disease. Last seen in the clinic in 2012. History of GI bleed March 2016, Recurrent GI bleed January 2018, transferred [facility] Off warfarin He presents for routine follow-up of his atrial fibrillation  Long discussion today concerning diagnosis of lymphoma (B-cell lymphoma on mini R CHOP therapy),  history of chemotherapy, managed by oncology He was in the hospital 06/28/2016 with viral gastritis, presented with anemia Hemoglobin 6.8, grossly heme positive He had EGD that showed nonbleeding ulcers, continue to drop his blood count requiring tagged red blood cell scan which showed active GI bleed lateral right mid abdomen, seen by GI and surgery service Given packed red blood cells numerous units Diagnosed with protein calorie malnutrition  In follow-up today he reports that he is slowly improving, strength is slowly coming back Feels stronger, eating more Slow improvement in blood count, still hematocrit 28  Denies any significant lower extremity edema, no significant cough No abdominal bloating, does not feel that he has fluid retention  History taking Lasix 20 mg daily  Echocardiogram ordered by oncology, reviewed with him in the room today This shows normal LV systolic function Moderately dilated left atrium, grossly no significant valve disease Right heart pressures were not estimated.  Selective images  concerning for left pleural effusion   EKG on today's visit reviewed previously by myself shows atrial fibrillation with no significant ST-T wave changes  Other past medical history reviewed He had bright red blood per rectum which presented acutely. He went to [facility], had a tagged nuclear blood scan that showed active bleeding. He then underwent catheter assisted embolization of artery to the colon with successful cessation of the bleeding. This was performed on 09/10/2014 Notes from [facility] indicate that they did not feel GI follow-up was indicated.  He has since been restarted on his anticoagulation by primary care, Dr Matthew Brown. Goal of his INR lowered down to 2.0-2.5 He denies any further bleeding.   PMH:   has a past medical history of Benign prostatic hypertrophy; Chronic headache (10/19/2015); ED (erectile dysfunction); End stage renal disease ([place]); Essential hypertension; GERD (gastroesophageal reflux disease); GIB (gastrointestinal bleeding); Gout; Hearing loss; Hemorrhoids; Hyperlipidemia; Lymphoma ([place]); Lymphoma ([place]) (2017); Multiple thyroid nodules (06/06/2016); Osteoarthrosis, unspecified whether generalized or localized, lower leg; Persistent atrial fibrillation ([place]); Prostatitis; Pulmonary hypertension; Renal transplant recipient; and Ulcers of both great toes ([place]).  PSH:    Past Surgical History:  Procedure Laterality Date  . BACK SURGERY    . ESOPHAGOGASTRODUODENOSCOPY  03/13/15   severe esophagitis, ulcerated mass  . ESOPHAGOGASTRODUODENOSCOPY (EGD) WITH PROPOFOL N/A 07/09/2016   Procedure: ESOPHAGOGASTRODUODENOSCOPY (EGD) WITH PROPOFOL;  Surgeon: [name], MD;  Location: [facility] ENDOSCOPY;  Service: Endoscopy;  Laterality: N/A;  . HERNIA REPAIR  1974  . PERIPHERAL VASCULAR CATHETERIZATION N/A 05/07/2016   Procedure: Glori Luis Cath Insertion;  Surgeon: Algernon Huxley, MD;  Location: [place] CV LAB;  Service: Cardiovascular;  Laterality: N/A;  . PROSTATE ABLATION    . STOMACH SURGERY      blood vessel burst  . THROAT SURGERY    . TOTAL KNEE ARTHROPLASTY      Current Outpatient Prescriptions  Medication Sig Dispense Refill  . acetaminophen (TYLENOL) 325 MG tablet Take 2 tablets (650 mg total) by mouth every 6 (six) hours as needed for mild pain (or Fever >/= 101).    [name] albuterol (PROAIR HFA) 108 (90 BASE) MCG/ACT inhaler Inhale 1-2 puffs into the lungs every 4 (four) hours as needed.     [name] allopurinol (ZYLOPRIM) 100 MG tablet Take 100 mg by mouth daily.      [name] COLCRYS 0.6 MG tablet Take 1 tablet by mouth 2 (two) times daily as needed.    . diphenhydrAMINE (BENADRYL) 25 mg capsule Take 1 capsule (25 mg total) by mouth at bedtime as needed for sleep. 30 capsule 0  . feeding supplement, ENSURE ENLIVE, (ENSURE ENLIVE) LIQD Take 237 mLs by mouth 3 (three) times daily between meals. 90 Bottle 0  . finasteride (PROSCAR) 5 MG tablet Take 5 mg by mouth daily.      . furosemide (LASIX) 20 MG tablet Take 20 mg by mouth daily. Take 1-2 tablets daily.    . hydroxypropyl methylcellulose (ISOPTO TEARS) 2.5 % ophthalmic solution Place 1 drop into both eyes as needed.     . Multiple Vitamin (MULTIVITAMIN) tablet Take 1 tablet by mouth daily.      . ondansetron (ZOFRAN) 4 MG tablet Take 1 tablet (4 mg total) by mouth every 6 (six) hours as needed for nausea. 20 tablet 0  . oxybutynin (DITROPAN-XL) 5 MG 24 hr tablet Take 5 mg by mouth daily.    [name] oxyCODONE-acetaminophen (ROXICET) 5-325 MG tablet Take 1 tablet by mouth every 6 (six) hours as needed for severe pain. 20 tablet 0  . pantoprazole (PROTONIX) 40 MG tablet Take 1 tablet (40 mg total) by mouth 2 (two) times daily. 60 tablet 1  . predniSONE (DELTASONE) 20 MG tablet as directed.     . predniSONE (DELTASONE) 5 MG tablet Take 5 mg by mouth daily.     . simethicone (MYLICON) 80 MG chewable tablet Chew 1 tablet (80 mg total) by mouth every 6 (six) hours as needed for flatulence. 120 tablet 0  . sucralfate (CARAFATE) 1 g tablet Take 1  tablet (1 g total) by mouth 4 (four) times daily. Resume taking after one week- once finished taking oral levaquine. ( to avoid interaction.) 40 tablet 3  . tacrolimus (PROGRAF) 1 MG capsule Take 3 mg by mouth 2 (two) times daily. Reported on 08/16/2015    . Tamsulosin HCl (FLOMAX) 0.4 MG CAPS Take 0.4 mg by mouth daily.      . traZODone (DESYREL) 50 MG tablet 1-2 TABS AS NEEDED FOR SLEEP  3   No current facility-administered medications for this visit.    Facility-Administered Medications Ordered in Other Visits  Medication Dose Route Frequency Provider Last Rate Last Dose  . methotrexate (PF) 12 mg in sodium chloride 0.9 % INTRATHECAL chemo injection   Intrathecal Once Lequita Asal, MD      . methotrexate (PF) 12 mg in sodium chloride 0.9 % INTRATHECAL chemo injection   Intrathecal Once Lequita Asal, MD         Allergies:   Patient has no known allergies.   Social History:  The patient  reports that he quit smoking about 38 years ago. His  smoking use included Cigarettes. He has a 25.00 pack-year smoking history. He has never used smokeless tobacco. He reports that he does not drink alcohol or use drugs.   Family History:   family history includes Cancer in his mother; Diabetes in his brother, brother, sister, and sister; Heart disease in his brother and sister; Hypertension in his brother and sister; Stroke in his brother.    Review of Systems: Review of Systems  Constitutional: Positive for malaise/fatigue and weight loss.  Respiratory: Negative.   Cardiovascular: Negative.   Gastrointestinal: Negative.   Musculoskeletal: Negative.   Neurological: Positive for weakness.  Psychiatric/Behavioral: Negative.   All other systems reviewed and are negative.    PHYSICAL EXAM: VS:  BP 132/64 (BP Location: Left Arm, Patient Position: Sitting, Cuff Size: Normal)   Pulse (!) 57   Ht 5\' 7"  (1.702 m)   Wt 126 lb 8 oz (57.4 kg)   BMI 19.81 kg/m  , BMI Body mass index is 19.81  kg/m. GEN: Thin,  in no acute distress  HEENT: normal  Neck: no JVD, carotid bruits, or masses Cardiac: RRR; 2/^ SEM RSB,  no rubs, or gallops,no edema  Respiratory:  clear to auscultation bilaterally, dullness at the bases bilaterally, normal work of breathing GI: soft, nontender, nondistended, + BS MS: no deformity or atrophy  Skin: warm and dry, no rash Neuro:  Strength and sensation are intact Psych: euthymic mood, full affect    Recent Labs: 07/09/2016: Magnesium 1.7 08/13/2016: ALT 11; BUN 17; Creatinine, Ser 1.10; Hemoglobin 9.1; Platelets 172; Potassium 4.3; Sodium 138; TSH 1.145    Lipid Panel No results found for: CHOL, HDL, LDLCALC, TRIG    Wt Readings from Last 3 Encounters:  09/01/16 126 lb 8 oz (57.4 kg)  08/13/16 122 lb 9.2 oz (55.6 kg)  08/04/16 125 lb 15.9 oz (57.2 kg)       ASSESSMENT AND PLAN:  Mixed hyperlipidemia - Plan: EKG 12-Lead Currently not on a statin Dramatic weight loss with lymphoma treatment and GI bleed  Essential hypertension - Plan: EKG 12-Lead Blood pressure is well controlled on today's visit. No changes made to the medications.  Chronic atrial fibrillation (HCC) - Plan: EKG 12-Lead Rate well controlled, currently not on anticoagulation Long discussion with him concerning "watchman" device, left atrial appendage closure device. Unable to he on anticoagulation given GI bleeding.  He wonders if this can be done at [facility] where he has much of his care He will talk about this with his renal physician/nephrology If not offered at [facility], recommended they call our office as we would set up an appointment in [facility]  Carotid atherosclerosis, bilateral - Plan: EKG 12-Lead Stable bilateral carotid disease less than 50% bilaterally by scan December 2017  Encounter for anticoagulation discussion and counseling - Plan: EKG 12-Lead Unable to tolerate warfarin given severe GI bleed Unclear if he may be a candidate for watchman device. This would  typically require aspirin and Plavix for a period of time  H/O: upper GI bleed - Plan: EKG 12-Lead Recent hospital records reviewed, details discussed with him   RENAL FAILURE, END STAGE - Plan: EKG 12-Lead Kidney transplant, normal renal function, followed by nephrology  Anemia Blood loss anemia, hospital admission January 2018 Poor nutrition also likely contributing Possible pleural effusion on echocardiogram This would hopefully improve as nutrition and anemia improves   Total encounter time more than 25 minutes  Greater than 50% was spent in counseling and coordination of care with the patient  Disposition:   F/U  6 months   Orders Placed This Encounter  Procedures  . EKG 12-Lead     Signed, Esmond Plants, M.D., Ph.D. 09/01/2016  [place], [place]

## 2016-09-01 ENCOUNTER — Ambulatory Visit
Admission: RE | Admit: 2016-09-01 | Discharge: 2016-09-01 | Disposition: A | Discharge status: Home / Self Care | Payer: Medicare HMO | Source: Ambulatory Visit | Attending: Hematology and Oncology | Admitting: Hematology and Oncology

## 2016-09-01 ENCOUNTER — Ambulatory Visit (INDEPENDENT_AMBULATORY_CARE_PROVIDER_SITE_OTHER): Payer: Medicare HMO | Admitting: Cardiovascular Disease

## 2016-09-01 ENCOUNTER — Encounter: Payer: Self-pay | Admitting: Cardiovascular Disease

## 2016-09-01 VITALS — BP 132/64 | HR 57 | Ht 67.0 in | Wt 126.5 lb

## 2016-09-01 DIAGNOSIS — C8338 Diffuse large B-cell lymphoma, lymph nodes of multiple sites: Secondary | ICD-10-CM | POA: Insufficient documentation

## 2016-09-01 DIAGNOSIS — I10 Essential (primary) hypertension: Secondary | ICD-10-CM | POA: Diagnosis not present

## 2016-09-01 DIAGNOSIS — Z7189 Other specified counseling: Secondary | ICD-10-CM

## 2016-09-01 DIAGNOSIS — Z8719 Personal history of other diseases of the digestive system: Secondary | ICD-10-CM

## 2016-09-01 DIAGNOSIS — I071 Rheumatic tricuspid insufficiency: Secondary | ICD-10-CM | POA: Diagnosis not present

## 2016-09-01 DIAGNOSIS — I482 Chronic atrial fibrillation, unspecified: Secondary | ICD-10-CM

## 2016-09-01 DIAGNOSIS — I6523 Occlusion and stenosis of bilateral carotid arteries: Secondary | ICD-10-CM | POA: Diagnosis not present

## 2016-09-01 DIAGNOSIS — N186 End stage renal disease: Secondary | ICD-10-CM

## 2016-09-01 DIAGNOSIS — E782 Mixed hyperlipidemia: Secondary | ICD-10-CM

## 2016-09-01 NOTE — Progress Notes (Signed)
*  PRELIMINARY RESULTS* Echocardiogram 2D Echocardiogram has been performed.  Matthew Brown 09/01/2016, 11:31 AM

## 2016-09-01 NOTE — Progress Notes (Signed)
4:41 PM   Matthew Brown 1940/07/20 [id number]  Referring provider: Arnetha Courser, MD [address] [place], [place] [address]  Chief Complaint  Patient presents with  . Benign Prostatic Hypertrophy    HPI: 76 yo AAM who presents for a yearly follow up for BPH with LU TS.  His IPSS score today is 0, which is no lower urinary tract symptomatology. He is mostly satisfied with his quality life due to his urinary symptoms.  Hi previous IPSS score was 6/1.     His has no major complaints today.    He denies any dysuria, hematuria or suprapubic pain.   He currently taking tamsulosin 0.4 mg daily and finasteride 5 mg daily.    He also denies any recent fevers, chills, nausea or vomiting.     IPSS    Row Name 09/02/16 1600         International Prostate Symptom Score   How often have you had the sensation of not emptying your bladder? Not at All     How often have you had to urinate less than every two hours? Not at All     How often have you found you stopped and started again several times when you urinated? Not at All     How often have you found it difficult to postpone urination? Not at All     How often have you had a weak urinary stream? Not at All     How often have you had to strain to start urination? Not at All     How many times did you typically get up at night to urinate? None     Total IPSS Score 0       Quality of Life due to urinary symptoms   If you were to spend the rest of your life with your urinary condition just the way it is now how would you feel about that? Mostly Satisfied        Score:  1-7 Mild 8-19 Moderate 20-35 Severe   PMH: Past Medical History:  Diagnosis Date  . Benign prostatic hypertrophy   . Chronic headache 10/19/2015  . ED (erectile dysfunction)   . End stage renal disease ([place])   . Essential hypertension   . GERD (gastroesophageal reflux disease)   . GIB (gastrointestinal bleeding)    a. 10/9561 s/p R colic artery  embolization;  b. 02/2015 EGD: duod ulcerative mass->Bx notable for coagulative necrosis - ? ischemia vs thrombosis-->coumadin d/c'd.  . Gout   . Hearing loss   . Hemorrhoids   . Hyperlipidemia   . Lymphoma ([place])   . Lymphoma ([place]) 2017  . Multiple thyroid nodules 06/06/2016   Noted on carotid US; dedicated US to be ordered by staff  . Osteoarthrosis, unspecified whether generalized or localized, lower leg   . Persistent atrial fibrillation ([place])    a. CHA2DS2VASc = 3-->coumadin d/c'd 02/2015 2/2 recurrent GIB.  [name] Prostatitis   . Pulmonary hypertension    a. 10/2014 Echo: EF 60-65%, mild to mod MR, mildly dil LA, nl RV, PASP 61mmHg.  [name] Renal transplant recipient   . Ulcers of both great toes [facility])     Surgical History: Past Surgical History:  Procedure Laterality Date  . BACK SURGERY    . ESOPHAGOGASTRODUODENOSCOPY  03/13/15   severe esophagitis, ulcerated mass  . ESOPHAGOGASTRODUODENOSCOPY (EGD) WITH PROPOFOL N/A 07/09/2016   Procedure: ESOPHAGOGASTRODUODENOSCOPY (EGD) WITH PROPOFOL;  Surgeon: [name], MD;  Location: [facility]  ENDOSCOPY;  Service: Endoscopy;  Laterality: N/A;  . HERNIA REPAIR  1974  . PERIPHERAL VASCULAR CATHETERIZATION N/A 05/07/2016   Procedure: Glori Luis Cath Insertion;  Surgeon: Algernon Huxley, MD;  Location: [place] CV LAB;  Service: Cardiovascular;  Laterality: N/A;  . PROSTATE ABLATION    . STOMACH SURGERY     blood vessel burst  . THROAT SURGERY    . TOTAL KNEE ARTHROPLASTY      Home Medications:  Allergies as of 09/02/2016   No Known Allergies     Medication List       Accurate as of 09/02/16  4:41 PM. Always use your most recent med list.          acetaminophen 325 MG tablet Commonly known as:  TYLENOL Take 2 tablets (650 mg total) by mouth every 6 (six) hours as needed for mild pain (or Fever >/= 101).   allopurinol 100 MG tablet Commonly known as:  ZYLOPRIM Take 100 mg by mouth daily.   COLCRYS 0.6 MG tablet Generic drug:   colchicine Take 1 tablet by mouth 2 (two) times daily as needed.   diphenhydrAMINE 25 mg capsule Commonly known as:  BENADRYL Take 1 capsule (25 mg total) by mouth at bedtime as needed for sleep.   feeding supplement (ENSURE ENLIVE) Liqd Take 237 mLs by mouth 3 (three) times daily between meals.   finasteride 5 MG tablet Commonly known as:  PROSCAR Take 1 tablet (5 mg total) by mouth daily.   furosemide 20 MG tablet Commonly known as:  LASIX Take 20 mg by mouth daily. Take 1-2 tablets daily.   hydroxypropyl methylcellulose 2.5 % ophthalmic solution Commonly known as:  ISOPTO TEARS Place 1 drop into both eyes as needed.   multivitamin tablet Take 1 tablet by mouth daily.   ondansetron 4 MG tablet Commonly known as:  ZOFRAN Take 1 tablet (4 mg total) by mouth every 6 (six) hours as needed for nausea.   oxybutynin 5 MG 24 hr tablet Commonly known as:  DITROPAN-XL Take 1 tablet (5 mg total) by mouth daily.   oxyCODONE-acetaminophen 5-325 MG tablet Commonly known as:  ROXICET Take 1 tablet by mouth every 6 (six) hours as needed for severe pain.   pantoprazole 40 MG tablet Commonly known as:  PROTONIX Take 1 tablet (40 mg total) by mouth 2 (two) times daily.   predniSONE 5 MG tablet Commonly known as:  DELTASONE Take 5 mg by mouth daily.   predniSONE 20 MG tablet Commonly known as:  DELTASONE as directed.   PROAIR HFA 108 (90 Base) MCG/ACT inhaler Generic drug:  albuterol Inhale 1-2 puffs into the lungs every 4 (four) hours as needed.   simethicone 80 MG chewable tablet Commonly known as:  MYLICON Chew 1 tablet (80 mg total) by mouth every 6 (six) hours as needed for flatulence.   sucralfate 1 g tablet Commonly known as:  CARAFATE Take 1 tablet (1 g total) by mouth 4 (four) times daily. Resume taking after one week- once finished taking oral levaquine. ( to avoid interaction.)   tacrolimus 1 MG capsule Commonly known as:  PROGRAF Take 3 mg by mouth 2 (two)  times daily. Reported on 08/16/2015   tamsulosin 0.4 MG Caps capsule Commonly known as:  FLOMAX Take 1 capsule (0.4 mg total) by mouth daily.   traZODone 50 MG tablet Commonly known as:  DESYREL 1-2 TABS AS NEEDED FOR SLEEP       Allergies: No Known Allergies  Family  History: Family History  Problem Relation Age of Onset  . Cancer Mother     throat  . Diabetes Brother   . Heart disease Brother   . Stroke Brother   . Hypertension Brother   . Diabetes Sister   . Heart disease Sister   . Hypertension Sister   . Diabetes Sister   . Diabetes Brother   . COPD Neg Hx   . Kidney disease Neg Hx   . Prostate cancer Neg Hx     Social History:  reports that he quit smoking about 38 years ago. His smoking use included Cigarettes. He has a 25.00 pack-year smoking history. He has never used smokeless tobacco. He reports that he does not drink alcohol or use drugs.  ROS: UROLOGY Frequent Urination?: No Hard to postpone urination?: No Burning/pain with urination?: No Get up at night to urinate?: No Leakage of urine?: No Urine stream starts and stops?: No Trouble starting stream?: No Do you have to strain to urinate?: No Blood in urine?: No Urinary tract infection?: No Sexually transmitted disease?: No Injury to kidneys or bladder?: No Painful intercourse?: No Weak stream?: No Erection problems?: No Penile pain?: No  Gastrointestinal Nausea?: No Vomiting?: No Indigestion/heartburn?: No Diarrhea?: No Constipation?: No  Constitutional Fever: No Night sweats?: No Weight loss?: No Fatigue?: No  Skin Skin rash/lesions?: No Itching?: No  Eyes Blurred vision?: No Double vision?: No  Ears/Nose/Throat Sore throat?: No Sinus problems?: Yes  Hematologic/Lymphatic Swollen glands?: No Easy bruising?: No  Cardiovascular Leg swelling?: No Chest pain?: No  Respiratory Cough?: No Shortness of breath?: No  Endocrine Excessive thirst?:  No  Musculoskeletal Back pain?: No Joint pain?: No  Neurological Headaches?: No Dizziness?: No  Psychologic Depression?: No Anxiety?: No  Physical Exam: BP (!) 146/63 (BP Location: Left Arm, Patient Position: Sitting, Cuff Size: Normal)   Pulse (!) 57   Ht 5\' 7"  (1.702 m)   Wt 124 lb 1.6 oz (56.3 kg)   BMI 19.44 kg/m   Constitutional: Well nourished. Alert and oriented, No acute distress. HEENT: [place] AT, moist mucus membranes. Trachea midline, no masses. Cardiovascular: No clubbing, cyanosis, or edema. Respiratory: Normal respiratory effort, no increased work of breathing. GI: Abdomen is soft, non tender, non distended, no abdominal masses. Liver and spleen not palpable.  No hernias appreciated.  Stool sample for occult testing is not indicated.   GU: No CVA tenderness.  No bladder fullness or masses.  Patient with uncircumcised phallus. Foreskin easily retracted Urethral meatus is patent.  No penile discharge. No penile lesions or rashes. Scrotum without lesions, cysts, rashes and/or edema.  Testicles are located scrotally bilaterally. No masses are appreciated in the testicles. Left and right epididymis are normal. Rectal: Patient with  normal sphincter tone. Anus and perineum without scarring or rashes. No rectal masses are appreciated. Prostate is approximately 45 grams, no nodules are appreciated. Seminal vesicles are normal. Skin: No rashes, bruises or suspicious lesions. Lymph: No cervical or inguinal adenopathy. Neurologic: Grossly intact, no focal deficits, moving all 4 extremities. Psychiatric: Normal mood and affect.  Laboratory Data: PSA History  0.36 ng/mL on 08/07/2015   Assessment & Plan:    1. BPH with LUTS  - IPSS score is 0/2, it is stable/improving/worsening  - Continue conservative management, avoiding bladder irritants and timed voiding's  - Continue tamsulosin 0.4 mg daily and finasteride 5 mg daily :refills given  - RTC in 12 months for IPSS and exam    Return in about 1 year (  around 09/02/2017) for IPSS and exam.  These notes generated with voice recognition software. I apologize for typographical errors.  Zara Council, [facility] [address] [place], [place] [address] [phone]

## 2016-09-01 NOTE — Patient Instructions (Signed)
Research the LEFT ATRIAL APPENDAGE CLOSURE DEVICE "WatchMan device" Decrease the risk of stroke in atrial fibrillation when anticoagulation can not be used   Medication Instructions:   No medication changes made  Labwork:  No new labs needed  Testing/Procedures:  No further testing at this time   I recommend watching educational videos on topics of interest to you at:       www.goemmi.com  Enter code: [email or web address]    Follow-Up: It was a pleasure seeing you in the office today. Please call us if you have new issues that need to be addressed before your next appt.  [phone]  Your physician wants you to follow-up in: 6 months.  You will receive a reminder letter in the mail two months in advance. If you don't receive a letter, please call our office to schedule the follow-up appointment.  If you need a refill on your cardiac medications before your next appointment, please call your pharmacy.

## 2016-09-02 ENCOUNTER — Encounter: Payer: Self-pay | Admitting: Urology

## 2016-09-02 ENCOUNTER — Inpatient Hospital Stay: Payer: Medicare HMO

## 2016-09-02 ENCOUNTER — Ambulatory Visit (INDEPENDENT_AMBULATORY_CARE_PROVIDER_SITE_OTHER): Payer: Medicare HMO | Admitting: Urology

## 2016-09-02 ENCOUNTER — Inpatient Hospital Stay: Payer: Medicare HMO | Admitting: Hematology and Oncology

## 2016-09-02 VITALS — BP 146/63 | HR 57 | Ht 67.0 in | Wt 124.1 lb

## 2016-09-02 DIAGNOSIS — N401 Enlarged prostate with lower urinary tract symptoms: Secondary | ICD-10-CM | POA: Diagnosis not present

## 2016-09-02 DIAGNOSIS — N138 Other obstructive and reflux uropathy: Secondary | ICD-10-CM

## 2016-09-02 MED ORDER — OXYBUTYNIN CHLORIDE ER 5 MG PO TB24: 5.0000 mg | ORAL_TABLET | Freq: Every day | ORAL | 3 refills | Status: AC

## 2016-09-02 MED ORDER — TAMSULOSIN HCL 0.4 MG PO CAPS: 0.4000 mg | ORAL_CAPSULE | Freq: Every day | ORAL | 12 refills | Status: DC

## 2016-09-02 MED ORDER — TAMSULOSIN HCL 0.4 MG PO CAPS: 0.4000 mg | ORAL_CAPSULE | Freq: Every day | ORAL | 3 refills | Status: AC

## 2016-09-02 MED ORDER — FINASTERIDE 5 MG PO TABS: 5.0000 mg | ORAL_TABLET | Freq: Every day | ORAL | 3 refills | Status: AC

## 2016-09-03 ENCOUNTER — Other Ambulatory Visit: Payer: Self-pay | Admitting: *Deleted

## 2016-09-03 ENCOUNTER — Ambulatory Visit
Admission: RE | Admit: 2016-09-03 | Discharge: 2016-09-03 | Disposition: A | Discharge status: Home / Self Care | Payer: Medicare HMO | Source: Ambulatory Visit | Attending: Hematology and Oncology | Admitting: Hematology and Oncology

## 2016-09-03 ENCOUNTER — Inpatient Hospital Stay (HOSPITAL_BASED_OUTPATIENT_CLINIC_OR_DEPARTMENT_OTHER): Payer: Medicare HMO | Admitting: Hematology and Oncology

## 2016-09-03 ENCOUNTER — Encounter: Payer: Self-pay | Admitting: Hematology and Oncology

## 2016-09-03 ENCOUNTER — Inpatient Hospital Stay: Payer: Medicare HMO | Attending: Hematology and Oncology

## 2016-09-03 ENCOUNTER — Other Ambulatory Visit: Payer: Self-pay | Admitting: Hematology and Oncology

## 2016-09-03 ENCOUNTER — Telehealth: Payer: Self-pay | Admitting: *Deleted

## 2016-09-03 VITALS — BP 138/73 | HR 56 | Temp 97.0°F | Ht 66.5 in | Wt 127.5 lb

## 2016-09-03 DIAGNOSIS — Z800 Family history of malignant neoplasm of digestive organs: Secondary | ICD-10-CM | POA: Diagnosis not present

## 2016-09-03 DIAGNOSIS — I129 Hypertensive chronic kidney disease with stage 1 through stage 4 chronic kidney disease, or unspecified chronic kidney disease: Secondary | ICD-10-CM

## 2016-09-03 DIAGNOSIS — Z87891 Personal history of nicotine dependence: Secondary | ICD-10-CM

## 2016-09-03 DIAGNOSIS — E210 Primary hyperparathyroidism: Secondary | ICD-10-CM | POA: Insufficient documentation

## 2016-09-03 DIAGNOSIS — I4891 Unspecified atrial fibrillation: Secondary | ICD-10-CM

## 2016-09-03 DIAGNOSIS — R5081 Fever presenting with conditions classified elsewhere: Secondary | ICD-10-CM

## 2016-09-03 DIAGNOSIS — C8330 Diffuse large B-cell lymphoma, unspecified site: Secondary | ICD-10-CM

## 2016-09-03 DIAGNOSIS — R918 Other nonspecific abnormal finding of lung field: Secondary | ICD-10-CM | POA: Diagnosis not present

## 2016-09-03 DIAGNOSIS — C7951 Secondary malignant neoplasm of bone: Secondary | ICD-10-CM

## 2016-09-03 DIAGNOSIS — I272 Pulmonary hypertension, unspecified: Secondary | ICD-10-CM | POA: Insufficient documentation

## 2016-09-03 DIAGNOSIS — C8338 Diffuse large B-cell lymphoma, lymph nodes of multiple sites: Secondary | ICD-10-CM

## 2016-09-03 DIAGNOSIS — G8929 Other chronic pain: Secondary | ICD-10-CM

## 2016-09-03 DIAGNOSIS — K219 Gastro-esophageal reflux disease without esophagitis: Secondary | ICD-10-CM

## 2016-09-03 DIAGNOSIS — N186 End stage renal disease: Secondary | ICD-10-CM

## 2016-09-03 DIAGNOSIS — D709 Neutropenia, unspecified: Secondary | ICD-10-CM | POA: Diagnosis not present

## 2016-09-03 DIAGNOSIS — Z940 Kidney transplant status: Secondary | ICD-10-CM

## 2016-09-03 DIAGNOSIS — Z79899 Other long term (current) drug therapy: Secondary | ICD-10-CM | POA: Diagnosis not present

## 2016-09-03 DIAGNOSIS — E079 Disorder of thyroid, unspecified: Secondary | ICD-10-CM | POA: Insufficient documentation

## 2016-09-03 DIAGNOSIS — J90 Pleural effusion, not elsewhere classified: Secondary | ICD-10-CM | POA: Insufficient documentation

## 2016-09-03 DIAGNOSIS — M1990 Unspecified osteoarthritis, unspecified site: Secondary | ICD-10-CM | POA: Insufficient documentation

## 2016-09-03 DIAGNOSIS — Z5112 Encounter for antineoplastic immunotherapy: Secondary | ICD-10-CM

## 2016-09-03 DIAGNOSIS — I517 Cardiomegaly: Secondary | ICD-10-CM | POA: Insufficient documentation

## 2016-09-03 DIAGNOSIS — D47Z1 Post-transplant lymphoproliferative disorder (PTLD): Secondary | ICD-10-CM | POA: Diagnosis not present

## 2016-09-03 DIAGNOSIS — Z5111 Encounter for antineoplastic chemotherapy: Secondary | ICD-10-CM

## 2016-09-03 DIAGNOSIS — C787 Secondary malignant neoplasm of liver and intrahepatic bile duct: Secondary | ICD-10-CM

## 2016-09-03 DIAGNOSIS — N400 Enlarged prostate without lower urinary tract symptoms: Secondary | ICD-10-CM | POA: Insufficient documentation

## 2016-09-03 DIAGNOSIS — E785 Hyperlipidemia, unspecified: Secondary | ICD-10-CM | POA: Insufficient documentation

## 2016-09-03 DIAGNOSIS — R634 Abnormal weight loss: Secondary | ICD-10-CM

## 2016-09-03 LAB — CBC WITH DIFFERENTIAL/PLATELET
Basophils Absolute: 0 10*3/uL (ref 0–0.1)
Basophils Relative: 1 %
Eosinophils Absolute: 0 10*3/uL (ref 0–0.7)
Eosinophils Relative: 1 %
HCT: 29.4 % — ABNORMAL LOW (ref 40.0–52.0)
Hemoglobin: 9.5 g/dL — ABNORMAL LOW (ref 13.0–18.0)
Lymphocytes Relative: 5 %
Lymphs Abs: 0.3 10*3/uL — ABNORMAL LOW (ref 1.0–3.6)
MCH: 28.4 pg (ref 26.0–34.0)
MCHC: 32.3 g/dL (ref 32.0–36.0)
MCV: 87.9 fL (ref 80.0–100.0)
Monocytes Absolute: 0.5 10*3/uL (ref 0.2–1.0)
Monocytes Relative: 8 %
Neutro Abs: 5.2 10*3/uL (ref 1.4–6.5)
Neutrophils Relative %: 85 %
Platelets: 146 10*3/uL — ABNORMAL LOW (ref 150–440)
RBC: 3.34 MIL/uL — ABNORMAL LOW (ref 4.40–5.90)
RDW: 19.6 % — ABNORMAL HIGH (ref 11.5–14.5)
WBC: 6.1 10*3/uL (ref 3.8–10.6)

## 2016-09-03 LAB — PROTIME-INR
INR: 1.11
Prothrombin Time: 14.4 seconds (ref 11.4–15.2)

## 2016-09-03 LAB — COMPREHENSIVE METABOLIC PANEL
ALT: 13 U/L — ABNORMAL LOW (ref 17–63)
AST: 19 U/L (ref 15–41)
Albumin: 3.9 g/dL (ref 3.5–5.0)
Alkaline Phosphatase: 90 U/L (ref 38–126)
Anion gap: 6 (ref 5–15)
BUN: 22 mg/dL — ABNORMAL HIGH (ref 6–20)
CO2: 27 mmol/L (ref 22–32)
Calcium: 10.1 mg/dL (ref 8.9–10.3)
Chloride: 105 mmol/L (ref 101–111)
Creatinine, Ser: 1.34 mg/dL — ABNORMAL HIGH (ref 0.61–1.24)
GFR calc Af Amer: 58 mL/min — ABNORMAL LOW (ref >60)
GFR calc non Af Amer: 50 mL/min — ABNORMAL LOW (ref >60)
Glucose, Bld: 117 mg/dL — ABNORMAL HIGH (ref 65–99)
Potassium: 4.6 mmol/L (ref 3.5–5.1)
Sodium: 138 mmol/L (ref 135–145)
Total Bilirubin: 0.3 mg/dL (ref 0.3–1.2)
Total Protein: 6.2 g/dL — ABNORMAL LOW (ref 6.5–8.1)

## 2016-09-03 LAB — URIC ACID: Uric Acid, Serum: 6.1 mg/dL (ref 4.4–7.6)

## 2016-09-03 LAB — APTT: aPTT: 29 seconds (ref 24–36)

## 2016-09-03 LAB — LACTATE DEHYDROGENASE: LDH: 171 U/L (ref 98–192)

## 2016-09-03 NOTE — Progress Notes (Signed)
Patient here for follow up. Has recently been diagnosed with A-fib.

## 2016-09-03 NOTE — Progress Notes (Signed)
[facility] day:  09/03/2016   Chief Complaint: Matthew Brown is a 76 y.o. male with post-transplant lymphoproliferative disorder, stage IVBE diffuse large B cell lymphoma, who is seen for assessment on the day prior to lumbar puncture and cycle #6 mini-RCHOP.  HPI:  The patient was last seen in the medical oncology clinic on 08/13/2016.  At that time, he felt great.  He was eating well, but not gaining weight.  Exam revealed no adenopathy or hepatosplenomegaly.  He received his second LP with IT MTX on 08/14/2016.  CSF cell count revealed 68 RBC and 0 WBCs.  Cytology was inadvertantly not performed.  He received cycle #5 mini-RCHOP with On-Pro Neulasta on 08/15/2016.  Echo on 09/01/2016 revealed an EF of 60-65%.  Imaging was concerning for a left sided pleural effusion.  Symptomatically, he feels "great".  He is eating everything he can get his hands on.  He is gaining weight.  He denies any B symptoms.  He denies any shortness of breath or chest pain.   Past Medical History:  Diagnosis Date  . Benign prostatic hypertrophy   . Chronic headache 10/19/2015  . ED (erectile dysfunction)   . End stage renal disease ([place])   . Essential hypertension   . GERD (gastroesophageal reflux disease)   . GIB (gastrointestinal bleeding)    a. 06/6107 s/p R colic artery embolization;  b. 02/2015 EGD: duod ulcerative mass->Bx notable for coagulative necrosis - ? ischemia vs thrombosis-->coumadin d/c'd.  . Gout   . Hearing loss   . Hemorrhoids   . Hyperlipidemia   . Lymphoma ([place])   . Lymphoma ([place]) 2017  . Multiple thyroid nodules 06/06/2016   Noted on carotid US; dedicated US to be ordered by staff  . Osteoarthrosis, unspecified whether generalized or localized, lower leg   . Persistent atrial fibrillation ([place])    a. CHA2DS2VASc = 3-->coumadin d/c'd 02/2015 2/2 recurrent GIB.  [name] Prostatitis   . Pulmonary hypertension    a. 10/2014 Echo: EF 60-65%, mild to mod  MR, mildly dil LA, nl RV, PASP 59mHg.  .Marland KitchenRenal transplant recipient   . Ulcers of both great toes ([facility]     Past Surgical History:  Procedure Laterality Date  . BACK SURGERY    . ESOPHAGOGASTRODUODENOSCOPY  03/13/15   severe esophagitis, ulcerated mass  . ESOPHAGOGASTRODUODENOSCOPY (EGD) WITH PROPOFOL N/A 07/09/2016   Procedure: ESOPHAGOGASTRODUODENOSCOPY (EGD) WITH PROPOFOL;  Surgeon: KJonathon Bellows MD;  Location: [facility] ENDOSCOPY;  Service: Endoscopy;  Laterality: N/A;  . HERNIA REPAIR  1974  . PERIPHERAL VASCULAR CATHETERIZATION N/A 05/07/2016   Procedure: PGlori LuisCath Insertion;  Surgeon: JAlgernon Huxley MD;  [facility];  Service: Cardiovascular;  Laterality: N/A;  . PROSTATE ABLATION    . STOMACH SURGERY     blood vessel burst  . THROAT SURGERY    . TOTAL KNEE ARTHROPLASTY      Family History  Problem Relation Age of Onset  . Cancer Mother     throat  . Diabetes Brother   . Heart disease Brother   . Stroke Brother   . Hypertension Brother   . Diabetes Sister   . Heart disease Sister   . Hypertension Sister   . Diabetes Sister   . Diabetes Brother   . COPD Neg Hx   . Kidney disease Neg Hx   . Prostate cancer Neg Hx     Social History:  reports that he quit smoking  about 38 years ago. His smoking use included Cigarettes. He has a 25.00 pack-year smoking history. He has never used smokeless tobacco. He reports that he does not drink alcohol or use drugs.  He stopped smoking in 1981.  He smoked 3 cigarettes/day.  He lives in [place].  The patient is accompanied by his wife, [name],  today.  Allergies: No Known Allergies  Current Medications: Current Outpatient Prescriptions  Medication Sig Dispense Refill  . acetaminophen (TYLENOL) 325 MG tablet Take 2 tablets (650 mg total) by mouth every 6 (six) hours as needed for mild pain (or Fever >/= 101).    [name] albuterol (PROAIR HFA) 108 (90 BASE) MCG/ACT inhaler Inhale 1-2 puffs into the lungs every 4 (four) hours as  needed.     [name] allopurinol (ZYLOPRIM) 100 MG tablet Take 100 mg by mouth daily.      [name] COLCRYS 0.6 MG tablet Take 1 tablet by mouth 2 (two) times daily as needed.    . diphenhydrAMINE (BENADRYL) 25 mg capsule Take 1 capsule (25 mg total) by mouth at bedtime as needed for sleep. 30 capsule 0  . feeding supplement, ENSURE ENLIVE, (ENSURE ENLIVE) LIQD Take 237 mLs by mouth 3 (three) times daily between meals. 90 Bottle 0  . finasteride (PROSCAR) 5 MG tablet Take 1 tablet (5 mg total) by mouth daily. 90 tablet 3  . furosemide (LASIX) 20 MG tablet Take 20 mg by mouth daily. Take 1-2 tablets daily.    . hydroxypropyl methylcellulose (ISOPTO TEARS) 2.5 % ophthalmic solution Place 1 drop into both eyes as needed.     . Multiple Vitamin (MULTIVITAMIN) tablet Take 1 tablet by mouth daily.      . ondansetron (ZOFRAN) 4 MG tablet Take 1 tablet (4 mg total) by mouth every 6 (six) hours as needed for nausea. 20 tablet 0  . oxybutynin (DITROPAN-XL) 5 MG 24 hr tablet Take 1 tablet (5 mg total) by mouth daily. 90 tablet 3  . oxyCODONE-acetaminophen (ROXICET) 5-325 MG tablet Take 1 tablet by mouth every 6 (six) hours as needed for severe pain. 20 tablet 0  . pantoprazole (PROTONIX) 40 MG tablet Take 1 tablet (40 mg total) by mouth 2 (two) times daily. 60 tablet 1  . predniSONE (DELTASONE) 20 MG tablet as directed.     . predniSONE (DELTASONE) 5 MG tablet Take 5 mg by mouth daily.     . simethicone (MYLICON) 80 MG chewable tablet Chew 1 tablet (80 mg total) by mouth every 6 (six) hours as needed for flatulence. 120 tablet 0  . sucralfate (CARAFATE) 1 g tablet Take 1 tablet (1 g total) by mouth 4 (four) times daily. Resume taking after one week- once finished taking oral levaquine. ( to avoid interaction.) 40 tablet 3  . tacrolimus (PROGRAF) 1 MG capsule Take 3 mg by mouth 2 (two) times daily. Reported on 08/16/2015    . tamsulosin (FLOMAX) 0.4 MG CAPS capsule Take 1 capsule (0.4 mg total) by mouth daily. 90 capsule 3   . traZODone (DESYREL) 50 MG tablet 1-2 TABS AS NEEDED FOR SLEEP  3   No current facility-administered medications for this visit.    Facility-Administered Medications Ordered in Other Visits  Medication Dose Route Frequency Provider Last Rate Last Dose  . methotrexate (PF) 12 mg in sodium chloride 0.9 % INTRATHECAL chemo injection   Intrathecal Once Lequita Asal, MD      . methotrexate (PF) 12 mg in sodium chloride 0.9 % INTRATHECAL chemo injection  Intrathecal Once Lequita Asal, MD        Review of Systems:  GENERAL:  Feels "great".  No fevers or sweats.  Weight up 5 pounds. PERFORMANCE STATUS (ECOG):  1 HEENT:  No visual changes, sore throat, mouth sores or tenderness. Lungs: No shortness of breath or cough.  No hemoptysis. Cardiac:  No chest pain, palpitations, orthopnea, or PND. GI:  Appetite great.  Eating well.  No nausea, vomiting, diarrhea, constipation, melena or hematochezia. GU:  Enlarged prostate.  No urgency, frequency, dysuria, or hematuria. Musculoskeletal:  No back pain.  No joint pain.  No muscle tenderness. Extremities:  No pain or swelling. Skin:  Top of right foot getting dark.  Nail changes.  No rashes or skin changes. Neuro:  No headache, numbness or weakness, balance or coordination issues. Endocrine:  No diabetes, thyroid issues, hot flashes or night sweats. Psych:  No mood changes, depression or anxiety. Pain:  No pain. Review of systems:  All other systems reviewed and found to be negative.  Physical Exam: Blood pressure 138/73, pulse (!) 56, temperature 97 F (36.1 C), temperature source Tympanic, height 5' 6.5" (1.689 m), weight 127 lb 8.6 oz (57.9 kg). GENERAL:  Thin elderly gentleman sitting comfortably in the exam room in no acute distress.  He has a cane at his side. MENTAL STATUS:  Alert and oriented to person, place and time. HEAD:  Lu D[place]el.  Temporal wasting.  Normocephalic, atraumatic, face symmetric, no Cushingoid  features. EYES:  Glasses.  Brown eyes.  Pupils equal round and reactive to light and accomodation.  No conjunctivitis or scleral icterus. ENT:  Oropharynx clear without lesion.  Edentulous.  Tongue normal. Mucous membranes moist.  RESPIRATORY:  Clear to auscultation without rales, wheezes or rhonchi. CARDIOVASCULAR:  Regular rate and rhythm without murmur, rub or gallop. ABDOMEN:  Soft, non-tender, with active bowel sounds, and no hepatosplenomegaly.  No masses.   SKIN:  No rashes, ulcers or lesions. EXTREMITIES:  No edema, no skin discoloration or tenderness.  Right upper extremity with dilated vascular s/p AV fistula (chronic).  No palpable cords. NEUROLOGICAL: Unremarkable. PSYCH:  Appropriate.    Appointment on 09/03/2016  Component Date Value Ref Range Status  . Sodium 09/03/2016 138  135 - 145 mmol/L Final  . Potassium 09/03/2016 4.6  3.5 - 5.1 mmol/L Final  . Chloride 09/03/2016 105  101 - 111 mmol/L Final  . CO2 09/03/2016 27  22 - 32 mmol/L Final  . Glucose, Bld 09/03/2016 117* 65 - 99 mg/dL Final  . BUN 09/03/2016 22* 6 - 20 mg/dL Final  . Creatinine, Ser 09/03/2016 1.34* 0.61 - 1.24 mg/dL Final  . Calcium 09/03/2016 10.1  8.9 - 10.3 mg/dL Final  . Total Protein 09/03/2016 6.2* 6.5 - 8.1 g/dL Final  . Albumin 09/03/2016 3.9  3.5 - 5.0 g/dL Final  . AST 09/03/2016 19  15 - 41 U/L Final  . ALT 09/03/2016 13* 17 - 63 U/L Final  . Alkaline Phosphatase 09/03/2016 90  38 - 126 U/L Final  . Total Bilirubin 09/03/2016 0.3  0.3 - 1.2 mg/dL Final  . GFR calc non Af Amer 09/03/2016 50* >60 mL/min Final  . GFR calc Af Amer 09/03/2016 58* >60 mL/min Final   Comment: (NOTE) The eGFR has been calculated using the CKD EPI equation. This calculation has not been validated in all clinical situations. eGFR's persistently <60 mL/min signify possible Chronic Kidney Disease.   . Anion gap 09/03/2016 6  5 - 15 Final  .  LDH 09/03/2016 171  98 - 192 U/L Final  . Uric Acid, Serum 09/03/2016 6.1   4.4 - 7.6 mg/dL Final    Assessment:  HOOPER PETTEWAY is a 76 y.o. male s/p renal transplant (2007) with a post-transplant lymphoproliferative disorder, stage IV diffuse large B cell lymphoma.  He presented with a 2-3 month history of progressive back pain superimposed on chronic back pain.    PET scan on 04/28/2016 revealed bulky intensely hypermetabolic periaortic upper abdominal and mesenteric adenopathy concerning for high-grade lymphoma.  There was hypermetabolic liver metastasis.  There was hpermetabolic lesion involving the small bowel of the upper pelvis.  There were multiple sites of hypermetabolic skeletal metastasis. There was moderate volume right pneumothorax.  CT guided retroperitoneal node biopsy on 04/30/2016 revealed diffuse large B cell lymphoma.  Hepatitis B and C testing on 05/06/2016 were negative.  Echo on 05/06/2016 revealed an EF of 55-60%.  Echo on 09/01/2016 revealed an EF of 60-65%.  Bone marrow aspirate and biopsy on 05/08/2016 revealed multifocal marrow involvement by diffuse large B-cell lymphoma. There was variably cellular marrow for age (50% - 90%) with a patchy predominantly nodular large B-cell infiltrate, overall estimated to account for 20% of the core biopsy. There was adequate residual trilineage hematopoiesis with mild nonspecific dyserythropoiesis. There was patchy mild increase in reticulin. Storage iron was present. The immunohistochemical staining pattern of the B-cell infiltrate (CD10 +/-, BCL 6+, BCL-2 +) suggested possible large cell transformation of follicular lymphoma.  Flow cytometry revealed no significant immunophenotypic abnormalities or evidence of B-cell lymphoma.  He has bone metastasis and hypercalcemia.  Calcium was 11.2 (ionized 7.3) on 05/13/2016.  He received Zometa on 05/13/2016.  He began [place] on 06/09/2016 (last 08/05/2016).  Work-up on 04/15/2016 revealed the following normal studies: ferritin (343), iron saturation (6%), TIBC (241; low),  B12 (525), folate (27).  Reticulocyte count was 2%.  LDH was 409.  Uric acid was 7.7 (4.4 - 7.6).  He was admitted at [facility] from 04/28/2016 - 04/30/2016 with a moderate volume right sided pneumothorax.  His pneumothorax improved spontaneously. Plain films of the right femur revealed the lucent bone lesion in the right femoral neck  was poorly characterized.  There was no evidence for an acute fracture.  PTH was 106 (high) 04/30/2016 with a calcium of 11.2.  Etiology was c/w primary hyperparathyroidism.  PTH-related polypeptide was < 1.1 on 04/30/2016.  He has a history of GI bleeding in 08/2014.  He underwent tagged RBC scan which revealed an active bleed in the hepatic flexure.  He was embolized in vascular interventional radiology.  He was admitted to [facility] from 07/08/2016 - 07/13/2016 with GI bleeding.  EGD on 07/09/2016 revealed multiple non-bleeding duodenal ulcer as well as duodenitis. Tagged RBC scan on 07/11/2016 revealed active GI bleed in the lateral right mid abdomen coursing through multiple curvilinear bowel loops in the right mid abdomen favoring a small bowel source of bleeding.  He received 5 units of PRBCs, 2 units of pheresed platelets, and vitamin K from 07/07/2016 0 07/13/2016.  He was transferred to [facility] from 07/13/2016 - 07/16/2016.  [facility] performed a colonoscopy and push enteroscopy which showed several diverticula and old blood but no source of bleeding. Source of bleed was presumed to be diverticular, although it was possible a small bowel site was not visualized on endoscopy.  He has a history of renal failure s/p renal transplant.  He is tacrolimus (Prograf), and steroids  Creatinine has ranged between 1.43 - 1.93  in the past 6 months. Mycophenolate (MMF) was discontinued at diagnosis.  Prograf level was 5.5 (3.0-8.0) on 05/08/2016 and 2.2 on 08/13/2016.   He is currently day 20 of cycle #5 mini-RCHOP (05/09/2016 - 08/15/2016).  Cycle #1 was complicated by fever and neutropenia.   All cultures were negative.  He was treated empirically with Cefepime.  Peripheral smear revealed schistocytes.  Cycle #3 was complicated by a GI bleed.  PET scan on 08/12/2016 revealed interval response to therapy. There has been significant decrease in extent of hypermetabolic tumor within the neck, chest, abdomen and pelvis as well as the axial and appendicular skeleton.  There was residual enlarged and hypermetabolic small bowel mesenteric and periaortic lymph nodes. There was a persistent hypermetabolic focus of increased uptake within the spleen.  There was resolution of previous multifocal hypermetabolic lesions within the liver.  CSF on 06/19/2016 revealed 7 WBCs (4% segs, 79% lymphs, and 17% monocytes).  He has undergone LP with IT MTX x 2 (07/24/2016 and 08/14/2016).  Cytology was negative on 07/24/2016.  Symptomatically, he denies any B symptoms.  He is eating well.  He is gaining weight.  Exam reveals no adenopathy of hepatosplenomegaly.  Plan: 1.  Labs today:  CBC with diff, CMP, LDH, uric acid, PT, PTT. 2.  Review interval echo.  EF normal.  Discuss plan for follow-up CXR. 3.  Follow-up with Dr Santiago Glad True re: tacrolimus level.  She will talk to his coordinator about checking another level 12 hours after dosing. 4.  CXR (PA and lateral) today. 5.  MD or RN to call patient with CXR results. 6.  LP with IT MTX tomorrow (3 of 4). 7.  RTC on 09/05/2016 in [place] for cycle #6 mini-RCHOP with On-Pro Neulasta. 8.  PET scan in 3 weeks. 9.  RTC in 4 weeks (10/01/2016) in [place] for MD assessment, labs (CBC with diff, CMP, LDH, uric acid, PT, PTT), and review of PET scan  10.  LP with IT MTX (4 of 4) in interventional radiology on 10/02/2016.   Lequita Asal, MD  09/03/2016, 11:40 AM

## 2016-09-03 NOTE — Telephone Encounter (Signed)
Called patient to inform him that his CXR today revealed no cardiopulmonary disease.  He will get his LP tomorrow morning. He [facility] the medical mall at 0800.  Patient should be NPO after midnight tonight.  Also informed patient that Dr. Mike Gip spoke with Dr. Suzan Nailer regarding patient's prograf.  A coordinator will be contacting the patient regarding dosage and an upcoming lab.  Also reminded them of RCHOP treatment on Friday at 9:30.  Also notified MD to pick up methotrexate from pharmacy @ 8:15. Spoke to [place] in pharmacy to remind them to mix methotrexate.

## 2016-09-04 ENCOUNTER — Ambulatory Visit
Admission: RE | Admit: 2016-09-04 | Discharge: 2016-09-04 | Disposition: A | Discharge status: Home / Self Care | Payer: Medicare HMO | Source: Ambulatory Visit | Attending: Hematology and Oncology | Admitting: Hematology and Oncology

## 2016-09-04 ENCOUNTER — Encounter: Payer: Self-pay | Admitting: Hematology and Oncology

## 2016-09-04 DIAGNOSIS — C8330 Diffuse large B-cell lymphoma, unspecified site: Secondary | ICD-10-CM

## 2016-09-04 DIAGNOSIS — C8339 Diffuse large B-cell lymphoma, extranodal and solid organ sites: Secondary | ICD-10-CM

## 2016-09-04 LAB — CSF CELL COUNT WITH DIFFERENTIAL
RBC Count, CSF: 37 /mm3 — ABNORMAL HIGH (ref 0–3)
Tube #: 3
WBC, CSF: 0 /mm3 (ref 0–5)

## 2016-09-04 LAB — GLUCOSE, CSF: Glucose, CSF: 53 mg/dL (ref 40–70)

## 2016-09-04 LAB — PROTEIN, CSF: Total  Protein, CSF: 36 mg/dL (ref 15–45)

## 2016-09-04 MED ORDER — ACETAMINOPHEN 325 MG PO TABS
650.0000 mg | ORAL_TABLET | Freq: Once | ORAL | Status: AC
  Administered 2016-09-04: 650 mg via ORAL
  Filled 2016-09-04: qty 2

## 2016-09-04 MED ORDER — SODIUM CHLORIDE 0.9 % IJ SOLN
Freq: Once | INTRAMUSCULAR | Status: DC
  Filled 2016-09-04: qty 0.48

## 2016-09-04 MED ORDER — ACETAMINOPHEN 325 MG PO TABS
ORAL_TABLET | ORAL | Status: AC
  Filled 2016-09-04: qty 2

## 2016-09-04 NOTE — Discharge Instructions (Signed)
Lumbar Puncture, Care After °Refer to this sheet in the next few weeks. These instructions provide you with information on caring for yourself after your procedure. Your health care provider may also give you more specific instructions. Your treatment has been planned according to current medical practices, but problems sometimes occur. Call your health care provider if you have any problems or questions after your procedure. °What can I expect after the procedure? °After your procedure, it is typical to have the following sensations: °· Mild discomfort or pain at the insertion site. °· Mild headache that is relieved with pain medicines. ° °Follow these instructions at home: ° °· Avoid lifting anything heavier than 10 lb (4.5 kg) for at least 12 hours after the procedure. °· Drink enough fluids to keep your urine clear or pale yellow. °Contact a health care provider if: °· You have fever or chills. °· You have nausea or vomiting. °· You have a headache that lasts for more than 2 days. °Get help right away if: °· You have any numbness or tingling in your legs. °· You are unable to control your bowel or bladder. °· You have bleeding or swelling in your back at the insertion site. °· You are dizzy or faint. °This information is not intended to replace advice given to you by your health care provider. Make sure you discuss any questions you have with your health care provider. °Document Released: 06/14/2013 Document Revised: 11/15/2015 Document Reviewed: 02/15/2013 °Elsevier Interactive Patient Education © 2017 [employer] ° °

## 2016-09-04 NOTE — Progress Notes (Signed)
  Date:09/04/2016  Procedure:Lumbar puncture with intrathecal methotrexate.  Indication: Diffuse large B cell lymphoma for CSF prophylaxis.  Consent:Obtained.  Procedure:The patient underwent lumbar puncture by Dr. Cari Caraway. The patient was placed in the left lateral decubitus position. Flouroscopy was used by Dr. Cari Caraway to confirm level. The patient was prepped and draped. Local anesthesia was infiltrated into skin. A 20 gauge needle was used to access the CSF. 6 cc of clear colorless CSF was obtained.   Under sterile technique, methotrexate 12 mg in preservative free saline (total volume 3 cc) was instilled slowly over 1 minute. The lumbar puncture needle was then removed slowly. A small bandage was placed. The patient tolerated the procedure well.  CSF was sent for cell count and diff, glucose, protein, and cytology.  Complications:None.   Lequita Asal, MD

## 2016-09-04 NOTE — Progress Notes (Signed)
Pt. c/o 5-6:10 HA.  Dr. [place] notified.  Tylenol given per order. Eating lunch.

## 2016-09-05 ENCOUNTER — Other Ambulatory Visit: Payer: Self-pay | Admitting: Hematology and Oncology

## 2016-09-05 ENCOUNTER — Inpatient Hospital Stay: Payer: Medicare HMO

## 2016-09-05 VITALS — BP 133/63 | HR 51 | Temp 97.3°F | Resp 18

## 2016-09-05 DIAGNOSIS — C8339 Diffuse large B-cell lymphoma, extranodal and solid organ sites: Secondary | ICD-10-CM

## 2016-09-05 DIAGNOSIS — C8330 Diffuse large B-cell lymphoma, unspecified site: Secondary | ICD-10-CM | POA: Diagnosis not present

## 2016-09-05 DIAGNOSIS — T8699 Other complications of unspecified transplanted organ and tissue: Secondary | ICD-10-CM

## 2016-09-05 DIAGNOSIS — C7951 Secondary malignant neoplasm of bone: Secondary | ICD-10-CM

## 2016-09-05 DIAGNOSIS — C8338 Diffuse large B-cell lymphoma, lymph nodes of multiple sites: Secondary | ICD-10-CM

## 2016-09-05 DIAGNOSIS — D47Z1 Post-transplant lymphoproliferative disorder (PTLD): Secondary | ICD-10-CM

## 2016-09-05 LAB — CYTOLOGY - NON PAP

## 2016-09-05 MED ORDER — VINCRISTINE SULFATE CHEMO INJECTION 1 MG/ML
1.0000 mg | Freq: Once | INTRAVENOUS | Status: AC
  Administered 2016-09-05: 1 mg via INTRAVENOUS
  Filled 2016-09-05: qty 1

## 2016-09-05 MED ORDER — SODIUM CHLORIDE 0.9 % IV SOLN
Freq: Once | INTRAVENOUS | Status: AC
  Administered 2016-09-05: 10:00:00 via INTRAVENOUS
  Filled 2016-09-05: qty 1000

## 2016-09-05 MED ORDER — HEPARIN SOD (PORK) LOCK FLUSH 100 UNIT/ML IV SOLN
500.0000 [IU] | Freq: Once | INTRAVENOUS | Status: AC
  Administered 2016-09-05: 500 [IU] via INTRAVENOUS

## 2016-09-05 MED ORDER — DEXAMETHASONE SODIUM PHOSPHATE 10 MG/ML IJ SOLN
10.0000 mg | Freq: Once | INTRAMUSCULAR | Status: AC
  Administered 2016-09-05: 10 mg via INTRAVENOUS
  Filled 2016-09-05: qty 1

## 2016-09-05 MED ORDER — SODIUM CHLORIDE 0.9% FLUSH
10.0000 mL | INTRAVENOUS | Status: DC | PRN
  Administered 2016-09-05: 10 mL via INTRAVENOUS
  Filled 2016-09-05: qty 10

## 2016-09-05 MED ORDER — CYCLOPHOSPHAMIDE CHEMO INJECTION 1 GM
400.0000 mg/m2 | Freq: Once | INTRAMUSCULAR | Status: AC
  Administered 2016-09-05: 660 mg via INTRAVENOUS
  Filled 2016-09-05: qty 33

## 2016-09-05 MED ORDER — PALONOSETRON HCL INJECTION 0.25 MG/5ML
0.2500 mg | Freq: Once | INTRAVENOUS | Status: AC
  Administered 2016-09-05: 0.25 mg via INTRAVENOUS
  Filled 2016-09-05: qty 5

## 2016-09-05 MED ORDER — DIPHENHYDRAMINE HCL 25 MG PO CAPS
50.0000 mg | ORAL_CAPSULE | Freq: Once | ORAL | Status: AC
  Administered 2016-09-05: 50 mg via ORAL
  Filled 2016-09-05: qty 2

## 2016-09-05 MED ORDER — DOXORUBICIN HCL CHEMO IV INJECTION 2 MG/ML
25.0000 mg/m2 | Freq: Once | INTRAVENOUS | Status: AC
  Administered 2016-09-05: 42 mg via INTRAVENOUS
  Filled 2016-09-05: qty 21

## 2016-09-05 MED ORDER — SODIUM CHLORIDE 0.9 % IV SOLN
375.0000 mg/m2 | Freq: Once | INTRAVENOUS | Status: AC
  Administered 2016-09-05: 600 mg via INTRAVENOUS
  Filled 2016-09-05: qty 50

## 2016-09-05 MED ORDER — DENOSUMAB 120 MG/1.7ML ~~LOC~~ SOLN
120.0000 mg | Freq: Once | SUBCUTANEOUS | Status: AC
  Administered 2016-09-05: 120 mg via SUBCUTANEOUS
  Filled 2016-09-05: qty 1.7

## 2016-09-05 MED ORDER — PEGFILGRASTIM 6 MG/0.6ML ~~LOC~~ PSKT
6.0000 mg | PREFILLED_SYRINGE | Freq: Once | SUBCUTANEOUS | Status: AC
  Administered 2016-09-05: 6 mg via SUBCUTANEOUS
  Filled 2016-09-05: qty 0.6

## 2016-09-05 MED ORDER — ACETAMINOPHEN 325 MG PO TABS
650.0000 mg | ORAL_TABLET | Freq: Once | ORAL | Status: AC
  Administered 2016-09-05: 650 mg via ORAL
  Filled 2016-09-05: qty 2

## 2016-09-16 ENCOUNTER — Other Ambulatory Visit: Payer: Self-pay | Admitting: *Deleted

## 2016-09-16 ENCOUNTER — Other Ambulatory Visit: Payer: Self-pay | Admitting: Hematology and Oncology

## 2016-09-16 ENCOUNTER — Telehealth: Payer: Self-pay | Admitting: *Deleted

## 2016-09-16 DIAGNOSIS — C8330 Diffuse large B-cell lymphoma, unspecified site: Secondary | ICD-10-CM

## 2016-09-16 NOTE — Telephone Encounter (Signed)
Called patients wife. Instructed to follow up with PCP voiced understanding.

## 2016-09-16 NOTE — Telephone Encounter (Signed)
Patient experienced a tragic loss of family member on Saturday and now is experiencing short term and long term memory loss. [name] is concerned. Please advise

## 2016-09-16 NOTE — Telephone Encounter (Signed)
Called patient's wife back to verify that the patient wasn't having other symptoms such as fever, headache, bruising or bleeding.  Wife denies these symptoms, encouraged them to follow up with PCP.  Voiced understanding.

## 2016-09-18 ENCOUNTER — Other Ambulatory Visit: Payer: Self-pay | Admitting: Hematology and Oncology

## 2016-09-18 ENCOUNTER — Telehealth: Payer: Self-pay | Admitting: *Deleted

## 2016-09-18 DIAGNOSIS — C8330 Diffuse large B-cell lymphoma, unspecified site: Secondary | ICD-10-CM

## 2016-09-18 NOTE — Telephone Encounter (Signed)
  Labs put in with order  M

## 2016-09-18 NOTE — Telephone Encounter (Signed)
Pamala Hurry in scheduling has put in order for patient's next methotrexate/chemo treatment for 10-02-16.  Needs you to put labs in for that date.  If you have questions you can call her @ [phone].

## 2016-09-22 ENCOUNTER — Other Ambulatory Visit: Payer: Self-pay | Admitting: *Deleted

## 2016-09-24 ENCOUNTER — Ambulatory Visit: Payer: Medicare HMO

## 2016-09-26 ENCOUNTER — Ambulatory Visit: Payer: Medicare HMO

## 2016-09-29 ENCOUNTER — Telehealth: Payer: Self-pay | Admitting: *Deleted

## 2016-09-29 ENCOUNTER — Inpatient Hospital Stay: Payer: Medicare HMO

## 2016-09-29 ENCOUNTER — Inpatient Hospital Stay: Payer: Medicare HMO | Admitting: Hematology and Oncology

## 2016-09-29 NOTE — Telephone Encounter (Signed)
Called patient's wife to inform her of his upcoming appts, voiced understanding.

## 2016-09-30 ENCOUNTER — Ambulatory Visit: Admission: RE | Admit: 2016-09-30 | Payer: Medicare HMO | Source: Ambulatory Visit

## 2016-10-01 ENCOUNTER — Encounter: Payer: Self-pay | Admitting: Hematology and Oncology

## 2016-10-01 ENCOUNTER — Inpatient Hospital Stay (HOSPITAL_BASED_OUTPATIENT_CLINIC_OR_DEPARTMENT_OTHER): Payer: Medicare HMO | Admitting: Hematology and Oncology

## 2016-10-01 ENCOUNTER — Inpatient Hospital Stay: Payer: Medicare HMO | Attending: Hematology and Oncology

## 2016-10-01 VITALS — BP 145/61 | HR 56 | Temp 97.0°F | Resp 18 | Wt 127.2 lb

## 2016-10-01 DIAGNOSIS — C8330 Diffuse large B-cell lymphoma, unspecified site: Secondary | ICD-10-CM | POA: Insufficient documentation

## 2016-10-01 DIAGNOSIS — N186 End stage renal disease: Secondary | ICD-10-CM

## 2016-10-01 DIAGNOSIS — K219 Gastro-esophageal reflux disease without esophagitis: Secondary | ICD-10-CM

## 2016-10-01 DIAGNOSIS — Z940 Kidney transplant status: Secondary | ICD-10-CM

## 2016-10-01 DIAGNOSIS — I2510 Atherosclerotic heart disease of native coronary artery without angina pectoris: Secondary | ICD-10-CM | POA: Diagnosis not present

## 2016-10-01 DIAGNOSIS — K5730 Diverticulosis of large intestine without perforation or abscess without bleeding: Secondary | ICD-10-CM | POA: Diagnosis not present

## 2016-10-01 DIAGNOSIS — R413 Other amnesia: Secondary | ICD-10-CM | POA: Insufficient documentation

## 2016-10-01 DIAGNOSIS — Z800 Family history of malignant neoplasm of digestive organs: Secondary | ICD-10-CM

## 2016-10-01 DIAGNOSIS — E785 Hyperlipidemia, unspecified: Secondary | ICD-10-CM | POA: Diagnosis not present

## 2016-10-01 DIAGNOSIS — J90 Pleural effusion, not elsewhere classified: Secondary | ICD-10-CM | POA: Diagnosis not present

## 2016-10-01 DIAGNOSIS — I481 Persistent atrial fibrillation: Secondary | ICD-10-CM | POA: Insufficient documentation

## 2016-10-01 DIAGNOSIS — M549 Dorsalgia, unspecified: Secondary | ICD-10-CM | POA: Diagnosis not present

## 2016-10-01 DIAGNOSIS — R51 Headache: Secondary | ICD-10-CM

## 2016-10-01 DIAGNOSIS — I129 Hypertensive chronic kidney disease with stage 1 through stage 4 chronic kidney disease, or unspecified chronic kidney disease: Secondary | ICD-10-CM | POA: Diagnosis not present

## 2016-10-01 DIAGNOSIS — E041 Nontoxic single thyroid nodule: Secondary | ICD-10-CM | POA: Insufficient documentation

## 2016-10-01 DIAGNOSIS — G8929 Other chronic pain: Secondary | ICD-10-CM

## 2016-10-01 DIAGNOSIS — E210 Primary hyperparathyroidism: Secondary | ICD-10-CM | POA: Diagnosis not present

## 2016-10-01 DIAGNOSIS — N529 Male erectile dysfunction, unspecified: Secondary | ICD-10-CM | POA: Diagnosis not present

## 2016-10-01 DIAGNOSIS — Z87891 Personal history of nicotine dependence: Secondary | ICD-10-CM | POA: Insufficient documentation

## 2016-10-01 DIAGNOSIS — N400 Enlarged prostate without lower urinary tract symptoms: Secondary | ICD-10-CM

## 2016-10-01 DIAGNOSIS — I272 Pulmonary hypertension, unspecified: Secondary | ICD-10-CM | POA: Insufficient documentation

## 2016-10-01 DIAGNOSIS — D47Z1 Post-transplant lymphoproliferative disorder (PTLD): Secondary | ICD-10-CM

## 2016-10-01 DIAGNOSIS — Z79899 Other long term (current) drug therapy: Secondary | ICD-10-CM

## 2016-10-01 DIAGNOSIS — M109 Gout, unspecified: Secondary | ICD-10-CM | POA: Insufficient documentation

## 2016-10-01 DIAGNOSIS — I517 Cardiomegaly: Secondary | ICD-10-CM | POA: Insufficient documentation

## 2016-10-01 LAB — CBC WITH DIFFERENTIAL/PLATELET
Basophils Absolute: 0 10*3/uL (ref 0–0.1)
Basophils Relative: 1 %
Eosinophils Absolute: 0.1 10*3/uL (ref 0–0.7)
Eosinophils Relative: 1 %
HCT: 30.8 % — ABNORMAL LOW (ref 40.0–52.0)
Hemoglobin: 10 g/dL — ABNORMAL LOW (ref 13.0–18.0)
Lymphocytes Relative: 9 %
Lymphs Abs: 0.4 10*3/uL — ABNORMAL LOW (ref 1.0–3.6)
MCH: 28.7 pg (ref 26.0–34.0)
MCHC: 32.5 g/dL (ref 32.0–36.0)
MCV: 88.3 fL (ref 80.0–100.0)
Monocytes Absolute: 0.5 10*3/uL (ref 0.2–1.0)
Monocytes Relative: 11 %
Neutro Abs: 3.7 10*3/uL (ref 1.4–6.5)
Neutrophils Relative %: 78 %
Platelets: 105 10*3/uL — ABNORMAL LOW (ref 150–440)
RBC: 3.49 MIL/uL — ABNORMAL LOW (ref 4.40–5.90)
RDW: 21.1 % — ABNORMAL HIGH (ref 11.5–14.5)
WBC: 4.8 10*3/uL (ref 3.8–10.6)

## 2016-10-01 LAB — COMPREHENSIVE METABOLIC PANEL
ALT: 11 U/L — ABNORMAL LOW (ref 17–63)
AST: 24 U/L (ref 15–41)
Albumin: 4.1 g/dL (ref 3.5–5.0)
Alkaline Phosphatase: 71 U/L (ref 38–126)
Anion gap: 6 (ref 5–15)
BUN: 23 mg/dL — ABNORMAL HIGH (ref 6–20)
CO2: 27 mmol/L (ref 22–32)
Calcium: 10.1 mg/dL (ref 8.9–10.3)
Chloride: 106 mmol/L (ref 101–111)
Creatinine, Ser: 1.11 mg/dL (ref 0.61–1.24)
GFR calc Af Amer: >60 mL/min (ref >60)
GFR calc non Af Amer: >60 mL/min (ref >60)
Glucose, Bld: 102 mg/dL — ABNORMAL HIGH (ref 65–99)
Potassium: 4.5 mmol/L (ref 3.5–5.1)
Sodium: 139 mmol/L (ref 135–145)
Total Bilirubin: 0.8 mg/dL (ref 0.3–1.2)
Total Protein: 6.3 g/dL — ABNORMAL LOW (ref 6.5–8.1)

## 2016-10-01 LAB — URIC ACID: Uric Acid, Serum: 6.4 mg/dL (ref 4.4–7.6)

## 2016-10-01 LAB — PROTIME-INR
INR: 1.15
Prothrombin Time: 14.8 seconds (ref 11.4–15.2)

## 2016-10-01 LAB — APTT: aPTT: 31 seconds (ref 24–36)

## 2016-10-01 LAB — LACTATE DEHYDROGENASE: LDH: 178 U/L (ref 98–192)

## 2016-10-01 NOTE — Progress Notes (Signed)
Patient offers no complaints today except some sinus issues.

## 2016-10-01 NOTE — Progress Notes (Signed)
[facility] day:  10/01/2016   Chief Complaint: Matthew Brown is a 76 y.o. male with post-transplant lymphoproliferative disorder, stage IVBE diffuse large B cell lymphoma, who is seen for assessment on the day prior to lumbar puncture and after 6 cycles of mini-RCHOP.  HPI:  The patient was last seen in the medical oncology clinic on 09/03/2016.  At that time, he felt great.  He was eating well and gaining weight.  He denied any B symptoms.  Exam revealed no hepatosplenomegaly or adenopathy.   He received his third LP with IT MTX on 09/04/2016.  CSF cell count revealed 37 RBC and 0 WBCs.  Cytology was negative for malignancy.  He received cycle #6 mini-RCHOP with On-Pro Neulasta on 09/05/2016.  Symptomatically, he denies any B symptoms.  Appetite is good.  He is eating well.  Weight is stable.  His memory is poor.   Past Medical History:  Diagnosis Date  . Benign prostatic hypertrophy   . Chronic headache 10/19/2015  . ED (erectile dysfunction)   . End stage renal disease ([place])   . Essential hypertension   . GERD (gastroesophageal reflux disease)   . GIB (gastrointestinal bleeding)    a. 07/2631 s/p R colic artery embolization;  b. 02/2015 EGD: duod ulcerative mass->Bx notable for coagulative necrosis - ? ischemia vs thrombosis-->coumadin d/c'd.  . Gout   . Hearing loss   . Hemorrhoids   . Hyperlipidemia   . Lymphoma ([place])   . Lymphoma ([place]) 2017  . Multiple thyroid nodules 06/06/2016   Noted on carotid US; dedicated US to be ordered by staff  . Osteoarthrosis, unspecified whether generalized or localized, lower leg   . Persistent atrial fibrillation ([place])    a. CHA2DS2VASc = 3-->coumadin d/c'd 02/2015 2/2 recurrent GIB.  [name] Prostatitis   . Pulmonary hypertension    a. 10/2014 Echo: EF 60-65%, mild to mod MR, mildly dil LA, nl RV, PASP 83mHg.  .Marland KitchenRenal transplant recipient   . Ulcers of both great toes ([facility]     Past Surgical History:   Procedure Laterality Date  . BACK SURGERY    . ESOPHAGOGASTRODUODENOSCOPY  03/13/15   severe esophagitis, ulcerated mass  . ESOPHAGOGASTRODUODENOSCOPY (EGD) WITH PROPOFOL N/A 07/09/2016   Procedure: ESOPHAGOGASTRODUODENOSCOPY (EGD) WITH PROPOFOL;  Surgeon: KJonathon Bellows MD;  Location: [facility] ENDOSCOPY;  Service: Endoscopy;  Laterality: N/A;  . HERNIA REPAIR  1974  . PERIPHERAL VASCULAR CATHETERIZATION N/A 05/07/2016   Procedure: PGlori LuisCath Insertion;  Surgeon: JAlgernon Huxley MD;  [facility];  Service: Cardiovascular;  Laterality: N/A;  . PROSTATE ABLATION    . STOMACH SURGERY     blood vessel burst  . THROAT SURGERY    . TOTAL KNEE ARTHROPLASTY      Family History  Problem Relation Age of Onset  . Cancer Mother     throat  . Diabetes Brother   . Heart disease Brother   . Stroke Brother   . Hypertension Brother   . Diabetes Sister   . Heart disease Sister   . Hypertension Sister   . Diabetes Sister   . Diabetes Brother   . COPD Neg Hx   . Kidney disease Neg Hx   . Prostate cancer Neg Hx     Social History:  reports that he quit smoking about 38 years ago. His smoking use included Cigarettes. He has a 25.00 pack-year smoking history. He has never used smokeless tobacco. He  reports that he does not drink alcohol or use drugs.  He stopped smoking in 1981.  He smoked 3 cigarettes/day.  He lives in [place].  The patient is accompanied by his wife, Matthew Brown,  today.  Allergies: No Known Allergies  Current Medications: Current Outpatient Prescriptions  Medication Sig Dispense Refill  . acetaminophen (TYLENOL) 325 MG tablet Take 2 tablets (650 mg total) by mouth every 6 (six) hours as needed for mild pain (or Fever >/= 101).    [name] albuterol (PROAIR HFA) 108 (90 BASE) MCG/ACT inhaler Inhale 1-2 puffs into the lungs every 4 (four) hours as needed.     [name] allopurinol (ZYLOPRIM) 100 MG tablet Take 100 mg by mouth daily.      [name] COLCRYS 0.6 MG tablet Take 1 tablet by mouth 2  (two) times daily as needed.    . diphenhydrAMINE (BENADRYL) 25 mg capsule Take 1 capsule (25 mg total) by mouth at bedtime as needed for sleep. 30 capsule 0  . feeding supplement, ENSURE ENLIVE, (ENSURE ENLIVE) LIQD Take 237 mLs by mouth 3 (three) times daily between meals. 90 Bottle 0  . finasteride (PROSCAR) 5 MG tablet Take 1 tablet (5 mg total) by mouth daily. 90 tablet 3  . furosemide (LASIX) 20 MG tablet Take 20 mg by mouth daily. Take 1-2 tablets daily.    . hydroxypropyl methylcellulose (ISOPTO TEARS) 2.5 % ophthalmic solution Place 1 drop into both eyes as needed.     . Multiple Vitamin (MULTIVITAMIN) tablet Take 1 tablet by mouth daily.      . ondansetron (ZOFRAN) 4 MG tablet Take 1 tablet (4 mg total) by mouth every 6 (six) hours as needed for nausea. 20 tablet 0  . oxybutynin (DITROPAN-XL) 5 MG 24 hr tablet Take 1 tablet (5 mg total) by mouth daily. 90 tablet 3  . oxyCODONE-acetaminophen (ROXICET) 5-325 MG tablet Take 1 tablet by mouth every 6 (six) hours as needed for severe pain. 20 tablet 0  . pantoprazole (PROTONIX) 40 MG tablet Take 1 tablet (40 mg total) by mouth 2 (two) times daily. 60 tablet 1  . predniSONE (DELTASONE) 20 MG tablet as directed.     . predniSONE (DELTASONE) 5 MG tablet Take 5 mg by mouth daily.     . simethicone (MYLICON) 80 MG chewable tablet Chew 1 tablet (80 mg total) by mouth every 6 (six) hours as needed for flatulence. 120 tablet 0  . sucralfate (CARAFATE) 1 g tablet Take 1 tablet (1 g total) by mouth 4 (four) times daily. Resume taking after one week- once finished taking oral levaquine. ( to avoid interaction.) 40 tablet 3  . tacrolimus (PROGRAF) 1 MG capsule Take 3 mg by mouth 2 (two) times daily. Reported on 08/16/2015    . tamsulosin (FLOMAX) 0.4 MG CAPS capsule Take 1 capsule (0.4 mg total) by mouth daily. 90 capsule 3  . traZODone (DESYREL) 50 MG tablet 1-2 TABS AS NEEDED FOR SLEEP  3   No current facility-administered medications for this visit.     Facility-Administered Medications Ordered in Other Visits  Medication Dose Route Frequency Provider Last Rate Last Dose  . methotrexate (PF) 12 mg in sodium chloride 0.9 % INTRATHECAL chemo injection   Intrathecal Once Lequita Asal, MD      . methotrexate (PF) 12 mg in sodium chloride 0.9 % INTRATHECAL chemo injection   Intrathecal Once Lequita Asal, MD        Review of Systems:  GENERAL:  Feels good.  No fevers or sweats.  Weight stable. PERFORMANCE STATUS (ECOG):  1 HEENT:  No visual changes, sore throat, mouth sores or tenderness. Lungs: No shortness of breath or cough.  No hemoptysis. Cardiac:  No chest pain, palpitations, orthopnea, or PND. GI:  Appetite great.  Eating well.  No nausea, vomiting, diarrhea, constipation, melena or hematochezia. GU:  Enlarged prostate.  No urgency, frequency, dysuria, or hematuria. Musculoskeletal:  No back pain.  No joint pain.  No muscle tenderness. Extremities:  No pain or swelling. Skin:  Top of right foot dark (old).  Nail changes.  No rashes or skin changes. Neuro:  Memory poor.  No headache, numbness or weakness, balance or coordination issues. Endocrine:  No diabetes, thyroid issues, hot flashes or night sweats. Psych:  No mood changes, depression or anxiety. Pain:  No pain. Review of systems:  All other systems reviewed and found to be negative.  Physical Exam: Blood pressure (!) 145/61, pulse (!) 56, temperature 97 F (36.1 C), temperature source Tympanic, resp. rate 18, weight 127 lb 3.3 oz (57.7 kg). GENERAL:  Thin elderly gentleman sitting comfortably in the exam room in no acute distress.  He has a cane at his side. MENTAL STATUS:  Alert and oriented to person, place and time. HEAD:  [place].  Temporal wasting.  Normocephalic, atraumatic, face symmetric, no Cushingoid features. EYES:  Glasses.  Brown eyes.  Pupils equal round and reactive to light and accomodation.  No conjunctivitis or scleral icterus. ENT:   Oropharynx clear without lesion.  Edentulous.  Tongue normal. Mucous membranes moist.  RESPIRATORY:  Clear to auscultation without rales, wheezes or rhonchi. CARDIOVASCULAR:  Regular rate and rhythm without murmur, rub or gallop. ABDOMEN:  Soft, non-tender, with active bowel sounds, and no hepatosplenomegaly.  No masses.   SKIN:  No rashes, ulcers or lesions. EXTREMITIES:  No edema, no skin discoloration or tenderness.  Right upper extremity with dilated veins s/p AV fistula (chronic).  No palpable cords. NEUROLOGICAL:  Alert & oriented, cranial nerves II-XII intact; motor strength and sensation intact; gait normal.  Know current president, but past president only with prompting.  Able to count backwards.  Remembers 2 of 3 words with prompting. PSYCH:  Appropriate.    Appointment on 10/01/2016  Component Date Value Ref Range Status  . WBC 10/01/2016 4.8  3.8 - 10.6 K/uL Final  . RBC 10/01/2016 3.49* 4.40 - 5.90 MIL/uL Final  . Hemoglobin 10/01/2016 10.0* 13.0 - 18.0 g/dL Final  . HCT 10/01/2016 30.8* 40.0 - 52.0 % Final  . MCV 10/01/2016 88.3  80.0 - 100.0 fL Final  . MCH 10/01/2016 28.7  26.0 - 34.0 pg Final  . MCHC 10/01/2016 32.5  32.0 - 36.0 g/dL Final  . RDW 10/01/2016 21.1* 11.5 - 14.5 % Final  . Platelets 10/01/2016 105* 150 - 440 K/uL Final  . Neutrophils Relative % 10/01/2016 78  % Final  . Neutro Abs 10/01/2016 3.7  1.4 - 6.5 K/uL Final  . Lymphocytes Relative 10/01/2016 9  % Final  . Lymphs Abs 10/01/2016 0.4* 1.0 - 3.6 K/uL Final  . Monocytes Relative 10/01/2016 11  % Final  . Monocytes Absolute 10/01/2016 0.5  0.2 - 1.0 K/uL Final  . Eosinophils Relative 10/01/2016 1  % Final  . Eosinophils Absolute 10/01/2016 0.1  0 - 0.7 K/uL Final  . Basophils Relative 10/01/2016 1  % Final  . Basophils Absolute 10/01/2016 0.0  0 - 0.1 K/uL Final  . Sodium 10/01/2016 139  135 -  145 mmol/L Final  . Potassium 10/01/2016 4.5  3.5 - 5.1 mmol/L Final  . Chloride 10/01/2016 106  101 - 111  mmol/L Final  . CO2 10/01/2016 27  22 - 32 mmol/L Final  . Glucose, Bld 10/01/2016 102* 65 - 99 mg/dL Final  . BUN 10/01/2016 23* 6 - 20 mg/dL Final  . Creatinine, Ser 10/01/2016 1.11  0.61 - 1.24 mg/dL Final  . Calcium 10/01/2016 10.1  8.9 - 10.3 mg/dL Final  . Total Protein 10/01/2016 6.3* 6.5 - 8.1 g/dL Final  . Albumin 10/01/2016 4.1  3.5 - 5.0 g/dL Final  . AST 10/01/2016 24  15 - 41 U/L Final  . ALT 10/01/2016 11* 17 - 63 U/L Final  . Alkaline Phosphatase 10/01/2016 71  38 - 126 U/L Final  . Total Bilirubin 10/01/2016 0.8  0.3 - 1.2 mg/dL Final  . GFR calc non Af Amer 10/01/2016 >60  >60 mL/min Final  . GFR calc Af Amer 10/01/2016 >60  >60 mL/min Final   Comment: (NOTE) The eGFR has been calculated using the CKD EPI equation. This calculation has not been validated in all clinical situations. eGFR's persistently <60 mL/min signify possible Chronic Kidney Disease.   . Anion gap 10/01/2016 6  5 - 15 Final  . LDH 10/01/2016 178  98 - 192 U/L Final  . Uric Acid, Serum 10/01/2016 6.4  4.4 - 7.6 mg/dL Final  . Prothrombin Time 10/01/2016 14.8  11.4 - 15.2 seconds Final  . INR 10/01/2016 1.15   Final    Assessment:  Matthew Brown is a 76 y.o. male s/p renal transplant (2007) with a post-transplant lymphoproliferative disorder, stage IV diffuse large B cell lymphoma.  He presented with a 2-3 month history of progressive back pain superimposed on chronic back pain.    PET scan on 04/28/2016 revealed bulky intensely hypermetabolic periaortic upper abdominal and mesenteric adenopathy concerning for high-grade lymphoma.  There was hypermetabolic liver metastasis.  There was hpermetabolic lesion involving the small bowel of the upper pelvis.  There were multiple sites of hypermetabolic skeletal metastasis. There was moderate volume right pneumothorax.  CT guided retroperitoneal node biopsy on 04/30/2016 revealed diffuse large B cell lymphoma.  Hepatitis B and C testing on 05/06/2016 were  negative.  Echo on 05/06/2016 revealed an EF of 55-60%.  Echo on 09/01/2016 revealed an EF of 60-65%.  Bone marrow aspirate and biopsy on 05/08/2016 revealed multifocal marrow involvement by diffuse large B-cell lymphoma. There was variably cellular marrow for age (50% - 90%) with a patchy predominantly nodular large B-cell infiltrate, overall estimated to account for 20% of the core biopsy. There was adequate residual trilineage hematopoiesis with mild nonspecific dyserythropoiesis. There was patchy mild increase in reticulin. Storage iron was present. The immunohistochemical staining pattern of the B-cell infiltrate (CD10 +/-, BCL 6+, BCL-2 +) suggested possible large cell transformation of follicular lymphoma.  Flow cytometry revealed no significant immunophenotypic abnormalities or evidence of B-cell lymphoma.  He has bone metastasis and hypercalcemia.  Calcium was 11.2 (ionized 7.3) on 05/13/2016.  He received Zometa on 05/13/2016.  He began [place] on 06/09/2016 (last 08/05/2016).  Work-up on 04/15/2016 revealed the following normal studies: ferritin (343), iron saturation (6%), TIBC (241; low), B12 (525), folate (27).  Reticulocyte count was 2%.  LDH was 409.  Uric acid was 7.7 (4.4 - 7.6).  He was admitted at [facility] from 04/28/2016 - 04/30/2016 with a moderate volume right sided pneumothorax.  His pneumothorax improved spontaneously. Plain films of the  right femur revealed the lucent bone lesion in the right femoral neck  was poorly characterized.  There was no evidence for an acute fracture.  PTH was 106 (high) 04/30/2016 with a calcium of 11.2.  Etiology was c/w primary hyperparathyroidism.  PTH-related polypeptide was < 1.1 on 04/30/2016.  He has a history of GI bleeding in 08/2014.  He underwent tagged RBC scan which revealed an active bleed in the hepatic flexure.  He was embolized in vascular interventional radiology.  He was admitted to [facility] from 07/08/2016 - 07/13/2016 with GI bleeding.  EGD  on 07/09/2016 revealed multiple non-bleeding duodenal ulcer as well as duodenitis. Tagged RBC scan on 07/11/2016 revealed active GI bleed in the lateral right mid abdomen coursing through multiple curvilinear bowel loops in the right mid abdomen favoring a small bowel source of bleeding.  He received 5 units of PRBCs, 2 units of pheresed platelets, and vitamin K from 07/07/2016 0 07/13/2016.  He was transferred to [facility] from 07/13/2016 - 07/16/2016.  [facility] performed a colonoscopy and push enteroscopy which showed several diverticula and old blood but no source of bleeding. Source of bleed was presumed to be diverticular, although it was possible a small bowel site was not visualized on endoscopy.  He has a history of renal failure s/p renal transplant.  He is tacrolimus (Prograf), and steroids  Creatinine has ranged between 1.43 - 1.93 in the past 6 months. Mycophenolate (MMF) was discontinued at diagnosis.  Prograf level was 5.5 (3.0-8.0) on 05/08/2016 and 2.2 on 08/13/2016.   He is currently day 27 s/p cycle #6 mini-RCHOP (05/09/2016 - 09/05/2016).  Cycle #1 was complicated by fever and neutropenia.  All cultures were negative.  He was treated empirically with Cefepime.  Peripheral smear revealed schistocytes.  Cycle #3 was complicated by a GI bleed.  PET scan on 08/12/2016 revealed interval response to therapy. There has been significant decrease in extent of hypermetabolic tumor within the neck, chest, abdomen and pelvis as well as the axial and appendicular skeleton.  There was residual enlarged and hypermetabolic small bowel mesenteric and periaortic lymph nodes. There was a persistent hypermetabolic focus of increased uptake within the spleen.  There was resolution of previous multifocal hypermetabolic lesions within the liver.  CSF on 06/19/2016 revealed 7 WBCs (4% segs, 79% lymphs, and 17% monocytes).  He has undergone LP with IT MTX x 3 (07/24/2016, 08/14/2016, 09/04/2016).  Cytology was negative  on 07/24/2016 and 09/04/2016.  Symptomatically, he denies any B symptoms.  He is eating well.  Weight it stable.  Exam reveals no adenopathy of hepatosplenomegaly.  Plan: 1.  Labs today:  CBC with diff, CMP, LDH, uric acid, PT, PTT. 2.  LP with IT MTX tomorrow (4 of 4). 3.  Prograf level Monday in [place] lab at 8:00 AM. 4.  PET scan on Monday (already scheduled). 5.  RTC on 10/13/2016 in [place] for MD assessment and review of PET.   Lequita Asal, MD  10/01/2016, 10:24 AM

## 2016-10-02 ENCOUNTER — Ambulatory Visit
Admission: RE | Admit: 2016-10-02 | Discharge: 2016-10-02 | Disposition: A | Discharge status: Home / Self Care | Payer: Medicare HMO | Source: Ambulatory Visit | Attending: Hematology and Oncology | Admitting: Hematology and Oncology

## 2016-10-02 ENCOUNTER — Encounter: Payer: Self-pay | Admitting: Hematology and Oncology

## 2016-10-02 DIAGNOSIS — C8330 Diffuse large B-cell lymphoma, unspecified site: Secondary | ICD-10-CM | POA: Insufficient documentation

## 2016-10-02 DIAGNOSIS — C8339 Diffuse large B-cell lymphoma, extranodal and solid organ sites: Secondary | ICD-10-CM

## 2016-10-02 LAB — CSF CELL COUNT WITH DIFFERENTIAL
RBC Count, CSF: 82 /mm3 — ABNORMAL HIGH (ref 0–3)
Tube #: 3
WBC, CSF: 0 /mm3 (ref 0–5)

## 2016-10-02 LAB — GLUCOSE, CSF: Glucose, CSF: 56 mg/dL (ref 40–70)

## 2016-10-02 LAB — PROTEIN, CSF: Total  Protein, CSF: 39 mg/dL (ref 15–45)

## 2016-10-02 MED ORDER — SODIUM CHLORIDE 0.9 % IJ SOLN
Freq: Once | INTRAMUSCULAR | Status: DC
  Filled 2016-10-02: qty 0.48

## 2016-10-02 NOTE — Progress Notes (Signed)
Date:10/02/2016  Procedure:Lumbar puncture with intrathecal methotrexate.  Indication: Diffuse large B cell lymphoma for CSF prophylaxis.  Consent:Obtained.  Procedure:The patient underwent lumbar puncture by Dr. Cari Caraway. The patient was placed in the left lateral decubitus position. Flouroscopy was used by Dr. Dahlia Byes confirm level. The patient was prepped and draped. Local anesthesia was infiltrated into skin. A 20 gauge needle was used to access the CSF. 8 cc of clear colorless CSF was obtained.  Under sterile technique, methotrexate 12 mg in preservative free saline (total volume 3 cc) was instilled slowly over 1 minute. The lumbar puncture needle was then removed slowly. A small bandage was placed. The patient tolerated the procedure well.  CSF was sent for cell count and diff, glucose, protein, and cytology.  Complications:None.   Lequita Asal, MD 10/02/2016

## 2016-10-02 NOTE — Discharge Instructions (Signed)
Lumbar Puncture, Care After °Refer to this sheet in the next few weeks. These instructions provide you with information on caring for yourself after your procedure. Your health care provider may also give you more specific instructions. Your treatment has been planned according to current medical practices, but problems sometimes occur. Call your health care provider if you have any problems or questions after your procedure. °What can I expect after the procedure? °After your procedure, it is typical to have the following sensations: °· Mild discomfort or pain at the insertion site. °· Mild headache that is relieved with pain medicines. ° °Follow these instructions at home: ° °· Avoid lifting anything heavier than 10 lb (4.5 kg) for at least 12 hours after the procedure. °· Drink enough fluids to keep your urine clear or pale yellow. °Contact a health care provider if: °· You have fever or chills. °· You have nausea or vomiting. °· You have a headache that lasts for more than 2 days. °Get help right away if: °· You have any numbness or tingling in your legs. °· You are unable to control your bowel or bladder. °· You have bleeding or swelling in your back at the insertion site. °· You are dizzy or faint. °This information is not intended to replace advice given to you by your health care provider. Make sure you discuss any questions you have with your health care provider. °Document Released: 06/14/2013 Document Revised: 11/15/2015 Document Reviewed: 02/15/2013 °Elsevier Interactive Patient Education © 2017 [employer] ° °

## 2016-10-03 ENCOUNTER — Inpatient Hospital Stay: Payer: Medicare HMO | Admitting: Hematology and Oncology

## 2016-10-03 ENCOUNTER — Inpatient Hospital Stay: Payer: Medicare HMO

## 2016-10-05 ENCOUNTER — Other Ambulatory Visit: Payer: Self-pay | Admitting: Hematology and Oncology

## 2016-10-06 ENCOUNTER — Inpatient Hospital Stay: Payer: Medicare HMO

## 2016-10-06 DIAGNOSIS — C8330 Diffuse large B-cell lymphoma, unspecified site: Secondary | ICD-10-CM | POA: Diagnosis not present

## 2016-10-06 DIAGNOSIS — Z940 Kidney transplant status: Secondary | ICD-10-CM

## 2016-10-07 LAB — TACROLIMUS LEVEL: Tacrolimus (FK506) - LabCorp: 17.1 ng/mL (ref 2.0–20.0)

## 2016-10-10 ENCOUNTER — Ambulatory Visit
Admission: RE | Admit: 2016-10-10 | Discharge: 2016-10-10 | Disposition: A | Discharge status: Home / Self Care | Payer: Medicare HMO | Source: Ambulatory Visit | Attending: Hematology and Oncology | Admitting: Hematology and Oncology

## 2016-10-10 DIAGNOSIS — N289 Disorder of kidney and ureter, unspecified: Secondary | ICD-10-CM | POA: Diagnosis not present

## 2016-10-10 DIAGNOSIS — J90 Pleural effusion, not elsewhere classified: Secondary | ICD-10-CM | POA: Diagnosis not present

## 2016-10-10 DIAGNOSIS — K5730 Diverticulosis of large intestine without perforation or abscess without bleeding: Secondary | ICD-10-CM | POA: Diagnosis not present

## 2016-10-10 DIAGNOSIS — I700 Atherosclerosis of aorta: Secondary | ICD-10-CM | POA: Diagnosis not present

## 2016-10-10 DIAGNOSIS — I2510 Atherosclerotic heart disease of native coronary artery without angina pectoris: Secondary | ICD-10-CM | POA: Insufficient documentation

## 2016-10-10 DIAGNOSIS — Z940 Kidney transplant status: Secondary | ICD-10-CM | POA: Diagnosis not present

## 2016-10-10 DIAGNOSIS — C8330 Diffuse large B-cell lymphoma, unspecified site: Secondary | ICD-10-CM

## 2016-10-10 DIAGNOSIS — N261 Atrophy of kidney (terminal): Secondary | ICD-10-CM | POA: Diagnosis not present

## 2016-10-10 DIAGNOSIS — E041 Nontoxic single thyroid nodule: Secondary | ICD-10-CM | POA: Insufficient documentation

## 2016-10-10 DIAGNOSIS — J439 Emphysema, unspecified: Secondary | ICD-10-CM | POA: Insufficient documentation

## 2016-10-10 LAB — GLUCOSE, CAPILLARY: Glucose-Capillary: 77 mg/dL (ref 65–99)

## 2016-10-10 MED ORDER — FLUDEOXYGLUCOSE F - 18 (FDG) INJECTION
13.1000 | Freq: Once | INTRAVENOUS | Status: AC | PRN
  Administered 2016-10-10: 13.1 via INTRAVENOUS

## 2016-10-12 NOTE — Progress Notes (Unsigned)
[place] [facility] day:  10/12/2016   Chief Complaint: Matthew Brown is a 76 y.o. male with post-transplant lymphoproliferative disorder, stage IVBE diffuse large B cell lymphoma, who is seen for review of PET scan after 6 cycles of mini-RCHOP.  HPI:  The patient was last seen in the medical oncology clinic on 10/01/2016.  At that time, he was doing well.  He denied any B symptoms.  He was eating well.  Weight was stable.  Memory was poor.  He underwent LP with MTX on 10/02/2016.  Cell count revealed 82 RBCs and 0 WBCs.  Glucose was 56.  Protein was 39.  Cytology is pending.  PET scan was ordered but delayed secondary to insurance approval.    PET scan on 10/10/2016 revealed slight worsening compared to the prior study. Several lesions were slightly more hypermetabolic and there appeared to be a newly enlarged and hypermetabolic mesenteric node adjacent to one of the previous mesenteric lymph nodes. However, there are no new hypermetabolic lesions in the neck, chest, or skeleton.  The small hypermetabolic peripheral lesion of the right kidney upper pole is slightly less hypermetabolic than before.  There was emphysema and aortic atherosclerosis.  There was a photopenic structure adjacent to the gallbladder appears complex in could be a complex cyst, a venous varix in the liver, or a vascular malformation in the liver.  There was a small right pleural effusion, reduced in size.  There was cardiomegaly, oronary atherosclerosis, mldly prominent thyroid gland with a non hypermetabolic right thyroid nodule, severe bilateral native renal atrophy with a transplant kidney in the right pelvis, sigmoid diverticulosis, and pelvic floor laxity.    Past Medical History:  Diagnosis Date  . Benign prostatic hypertrophy   . Chronic headache 10/19/2015  . ED (erectile dysfunction)   . End stage renal disease ([place])   . Essential hypertension   . GERD (gastroesophageal reflux  disease)   . GIB (gastrointestinal bleeding)    a. 01/5630 s/p R colic artery embolization;  b. 02/2015 EGD: duod ulcerative mass->Bx notable for coagulative necrosis - ? ischemia vs thrombosis-->coumadin d/c'd.  . Gout   . Hearing loss   . Hemorrhoids   . Hyperlipidemia   . Lymphoma ([place])   . Lymphoma ([place]) 2017  . Multiple thyroid nodules 06/06/2016   Noted on carotid US; dedicated US to be ordered by staff  . Osteoarthrosis, unspecified whether generalized or localized, lower leg   . Persistent atrial fibrillation ([place])    a. CHA2DS2VASc = 3-->coumadin d/c'd 02/2015 2/2 recurrent GIB.  [name] Prostatitis   . Pulmonary hypertension ([place])    a. 10/2014 Echo: EF 60-65%, mild to mod MR, mildly dil LA, nl RV, PASP 56mmHg.  [name] Renal transplant recipient   . Ulcers of both great toes [facility])     Past Surgical History:  Procedure Laterality Date  . BACK SURGERY    . ESOPHAGOGASTRODUODENOSCOPY  03/13/15   severe esophagitis, ulcerated mass  . ESOPHAGOGASTRODUODENOSCOPY (EGD) WITH PROPOFOL N/A 07/09/2016   Procedure: ESOPHAGOGASTRODUODENOSCOPY (EGD) WITH PROPOFOL;  Surgeon: [name], MD;  Location: [facility] ENDOSCOPY;  Service: Endoscopy;  Laterality: N/A;  . HERNIA REPAIR  1974  . PERIPHERAL VASCULAR CATHETERIZATION N/A 05/07/2016   Procedure: Glori Luis Cath Insertion;  Surgeon: Algernon Huxley, MD;  Location: [facility];  Service: Cardiovascular;  Laterality: N/A;  . PROSTATE ABLATION    . STOMACH SURGERY     blood vessel burst  . THROAT SURGERY    .  TOTAL KNEE ARTHROPLASTY      Family History  Problem Relation Age of Onset  . Cancer Mother     throat  . Diabetes Brother   . Heart disease Brother   . Stroke Brother   . Hypertension Brother   . Diabetes Sister   . Heart disease Sister   . Hypertension Sister   . Diabetes Sister   . Diabetes Brother   . COPD Neg Hx   . Kidney disease Neg Hx   . Prostate cancer Neg Hx     Social History:  reports that he quit smoking about 38 years  ago. His smoking use included Cigarettes. He has a 25.00 pack-year smoking history. He has never used smokeless tobacco. He reports that he does not drink alcohol or use drugs.  He stopped smoking in 1981.  He smoked 3 cigarettes/day.  He lives in [place].  The patient is accompanied by his wife, Matthew Brown,  today.  Allergies: No Known Allergies  Current Medications: Current Outpatient Prescriptions  Medication Sig Dispense Refill  . acetaminophen (TYLENOL) 325 MG tablet Take 2 tablets (650 mg total) by mouth every 6 (six) hours as needed for mild pain (or Fever >/= 101).    [name] albuterol (PROAIR HFA) 108 (90 BASE) MCG/ACT inhaler Inhale 1-2 puffs into the lungs every 4 (four) hours as needed.     [name] allopurinol (ZYLOPRIM) 100 MG tablet Take 100 mg by mouth daily.      [name] COLCRYS 0.6 MG tablet Take 1 tablet by mouth 2 (two) times daily as needed.    . diphenhydrAMINE (BENADRYL) 25 mg capsule Take 1 capsule (25 mg total) by mouth at bedtime as needed for sleep. 30 capsule 0  . feeding supplement, ENSURE ENLIVE, (ENSURE ENLIVE) LIQD Take 237 mLs by mouth 3 (three) times daily between meals. 90 Bottle 0  . finasteride (PROSCAR) 5 MG tablet Take 1 tablet (5 mg total) by mouth daily. 90 tablet 3  . furosemide (LASIX) 20 MG tablet Take 20 mg by mouth daily. Take 1-2 tablets daily.    . hydroxypropyl methylcellulose (ISOPTO TEARS) 2.5 % ophthalmic solution Place 1 drop into both eyes as needed.     . Multiple Vitamin (MULTIVITAMIN) tablet Take 1 tablet by mouth daily.      . ondansetron (ZOFRAN) 4 MG tablet Take 1 tablet (4 mg total) by mouth every 6 (six) hours as needed for nausea. 20 tablet 0  . oxybutynin (DITROPAN-XL) 5 MG 24 hr tablet Take 1 tablet (5 mg total) by mouth daily. 90 tablet 3  . oxyCODONE-acetaminophen (ROXICET) 5-325 MG tablet Take 1 tablet by mouth every 6 (six) hours as needed for severe pain. 20 tablet 0  . pantoprazole (PROTONIX) 40 MG tablet TAKE 1 TABLET (40 MG TOTAL) BY MOUTH 2  (TWO) TIMES DAILY. 60 tablet 1  . predniSONE (DELTASONE) 20 MG tablet as directed.     . predniSONE (DELTASONE) 5 MG tablet Take 5 mg by mouth daily.     . simethicone (MYLICON) 80 MG chewable tablet Chew 1 tablet (80 mg total) by mouth every 6 (six) hours as needed for flatulence. 120 tablet 0  . sucralfate (CARAFATE) 1 g tablet Take 1 tablet (1 g total) by mouth 4 (four) times daily. Resume taking after one week- once finished taking oral levaquine. ( to avoid interaction.) 40 tablet 3  . tacrolimus (PROGRAF) 1 MG capsule Take 3 mg by mouth 2 (two) times daily. Reported on 08/16/2015    .  tamsulosin (FLOMAX) 0.4 MG CAPS capsule Take 1 capsule (0.4 mg total) by mouth daily. 90 capsule 3  . traZODone (DESYREL) 50 MG tablet 1-2 TABS AS NEEDED FOR SLEEP  3   No current facility-administered medications for this visit.    Facility-Administered Medications Ordered in Other Visits  Medication Dose Route Frequency Provider Last Rate Last Dose  . methotrexate (PF) 12 mg in sodium chloride 0.9 % INTRATHECAL chemo injection   Intrathecal Once Lequita Asal, MD      . methotrexate (PF) 12 mg in sodium chloride 0.9 % INTRATHECAL chemo injection   Intrathecal Once Lequita Asal, MD        Review of Systems:  GENERAL:  Feels "great".  No fevers or sweats.  Weight up 5 pounds. PERFORMANCE STATUS (ECOG):  1 HEENT:  No visual changes, sore throat, mouth sores or tenderness. Lungs: No shortness of breath or cough.  No hemoptysis. Cardiac:  No chest pain, palpitations, orthopnea, or PND. GI:  Appetite great.  Eating well.  No nausea, vomiting, diarrhea, constipation, melena or hematochezia. GU:  Enlarged prostate.  No urgency, frequency, dysuria, or hematuria. Musculoskeletal:  No back pain.  No joint pain.  No muscle tenderness. Extremities:  No pain or swelling. Skin:  Top of right foot getting dark.  Nail changes.  No rashes or skin changes. Neuro:  No headache, numbness or weakness, balance or  coordination issues. Endocrine:  No diabetes, thyroid issues, hot flashes or night sweats. Psych:  No mood changes, depression or anxiety. Pain:  No pain. Review of systems:  All other systems reviewed and found to be negative.  Physical Exam: There were no vitals taken for this visit. GENERAL:  Thin elderly gentleman sitting comfortably in the exam room in no acute distress.  He has a cane at his side. MENTAL STATUS:  Alert and oriented to person, place and time. HEAD:  Lu [place].  Temporal wasting.  Normocephalic, atraumatic, face symmetric, no Cushingoid features. EYES:  Glasses.  Brown eyes.  Pupils equal round and reactive to light and accomodation.  No conjunctivitis or scleral icterus. ENT:  Oropharynx clear without lesion.  Edentulous.  Tongue normal. Mucous membranes moist.  RESPIRATORY:  Clear to auscultation without rales, wheezes or rhonchi. CARDIOVASCULAR:  Regular rate and rhythm without murmur, rub or gallop. ABDOMEN:  Soft, non-tender, with active bowel sounds, and no hepatosplenomegaly.  No masses.   SKIN:  No rashes, ulcers or lesions. EXTREMITIES:  No edema, no skin discoloration or tenderness.  Right upper extremity with dilated vascular s/p AV fistula (chronic).  No palpable cords. NEUROLOGICAL: Unremarkable. PSYCH:  Appropriate.    Imaging studies:  04/28/2016 PET scan:  Bulky intensely hypermetabolic periaortic upper abdominal and mesenteric adenopathy concerning for high-grade lymphoma.  There was hypermetabolic liver metastasis.  There was hpermetabolic lesion involving the small bowel of the upper pelvis.  There were multiple sites of hypermetabolic skeletal metastasis. There was moderate volume right pneumothorax. 08/12/2016 PET scan:  Interval response to therapy. There has been significant decrease in extent of hypermetabolic tumor within the neck, chest, abdomen and pelvis as well as the axial and appendicular skeleton.  There was residual enlarged and  hypermetabolic small bowel mesenteric and periaortic lymph nodes. There was a persistent hypermetabolic focus of increased uptake within the spleen.  There was resolution of previous multifocal hypermetabolic lesions within the liver. 10/10/2016 PET scan:  Slight worsening compared to the prior study. Several lesions were slightly more hypermetabolic and there appeared to  be a newly enlarged and hypermetabolic mesenteric node adjacent to one of the previous mesenteric lymph nodes. However, there are no new hypermetabolic lesions in the neck, chest, or skeleton.  The small hypermetabolic peripheral lesion of the right kidney upper pole is slightly less hypermetabolic.   No visits with results within 3 Day(s) from this visit.  Latest known visit with results is:  Hospital Outpatient Visit on 10/10/2016  Component Date Value Ref Range Status  . Glucose-Capillary 10/10/2016 77  65 - 99 mg/dL Final    Assessment:  VANESSA ALESI is a 76 y.o. male s/p renal transplant (2007) with a post-transplant lymphoproliferative disorder, stage IV diffuse large B cell lymphoma.  He presented with a 2-3 month history of progressive back pain superimposed on chronic back pain.    PET scan on 04/28/2016 revealed bulky intensely hypermetabolic periaortic upper abdominal and mesenteric adenopathy concerning for high-grade lymphoma.  There was hypermetabolic liver metastasis.  There was hpermetabolic lesion involving the small bowel of the upper pelvis.  There were multiple sites of hypermetabolic skeletal metastasis. There was moderate volume right pneumothorax.  CT guided retroperitoneal node biopsy on 04/30/2016 revealed diffuse large B cell lymphoma.  Hepatitis B and C testing on 05/06/2016 were negative.  Echo on 05/06/2016 revealed an EF of 55-60%.  Echo on 09/01/2016 revealed an EF of 60-65%.  Bone marrow aspirate and biopsy on 05/08/2016 revealed multifocal marrow involvement by diffuse large B-cell lymphoma. There  was variably cellular marrow for age (50% - 90%) with a patchy predominantly nodular large B-cell infiltrate, overall estimated to account for 20% of the core biopsy. There was adequate residual trilineage hematopoiesis with mild nonspecific dyserythropoiesis. There was patchy mild increase in reticulin. Storage iron was present. The immunohistochemical staining pattern of the B-cell infiltrate (CD10 +/-, BCL 6+, BCL-2 +) suggested possible large cell transformation of follicular lymphoma.  Flow cytometry revealed no significant immunophenotypic abnormalities or evidence of B-cell lymphoma.  He has bone metastasis and hypercalcemia.  Calcium was 11.2 (ionized 7.3) on 05/13/2016.  He received Zometa on 05/13/2016.  He began [place] on 06/09/2016 (last 08/05/2016).  Work-up on 04/15/2016 revealed the following normal studies: ferritin (343), iron saturation (6%), TIBC (241; low), B12 (525), folate (27).  Reticulocyte count was 2%.  LDH was 409.  Uric acid was 7.7 (4.4 - 7.6).  He was admitted at [facility] from 04/28/2016 - 04/30/2016 with a moderate volume right sided pneumothorax.  His pneumothorax improved spontaneously. Plain films of the right femur revealed the lucent bone lesion in the right femoral neck  was poorly characterized.  There was no evidence for an acute fracture.  PTH was 106 (high) 04/30/2016 with a calcium of 11.2.  Etiology was c/w primary hyperparathyroidism.  PTH-related polypeptide was < 1.1 on 04/30/2016.  He has a history of GI bleeding in 08/2014.  He underwent tagged RBC scan which revealed an active bleed in the hepatic flexure.  He was embolized in vascular interventional radiology.  He was admitted to [facility] from 07/08/2016 - 07/13/2016 with GI bleeding.  EGD on 07/09/2016 revealed multiple non-bleeding duodenal ulcer as well as duodenitis. Tagged RBC scan on 07/11/2016 revealed active GI bleed in the lateral right mid abdomen coursing through multiple curvilinear bowel loops in the  right mid abdomen favoring a small bowel source of bleeding.  He received 5 units of PRBCs, 2 units of pheresed platelets, and vitamin K from 07/07/2016 0 07/13/2016.  He was transferred to [facility] from 07/13/2016 - 07/16/2016.  [facility] performed a colonoscopy and push enteroscopy which showed several diverticula and old blood but no source of bleeding. Source of bleed was presumed to be diverticular, although it was possible a small bowel site was not visualized on endoscopy.  He has a history of renal failure s/p renal transplant.  He is tacrolimus (Prograf), and steroids  Creatinine has ranged between 1.43 - 1.93 in the past 6 months. Mycophenolate (MMF) was discontinued at diagnosis.  Prograf level was 5.5 (3.0-8.0) on 05/08/2016 and 2.2 on 08/13/2016.   He received 6 cycles of mini-RCHOP (05/09/2016 - 09/05/2016).  Cycle #1 was complicated by fever and neutropenia.  All cultures were negative.  He was treated empirically with Cefepime.  Peripheral smear revealed schistocytes.  Cycle #3 was complicated by a GI bleed.  PET scan on 10/10/2016 revealed slight worsening compared to the prior study. Several lesions were slightly more hypermetabolic and there appeared to be a newly enlarged and hypermetabolic mesenteric node adjacent to one of the previous mesenteric lymph nodes. However, there are no new hypermetabolic lesions in the neck, chest, or skeleton.  The small hypermetabolic peripheral lesion of the right kidney upper pole is slightly less hypermetabolic.  CSF on 06/19/2016 revealed 7 WBCs (4% segs, 79% lymphs, and 17% monocytes).  He has undergone LP with IT MTX x 4 (07/24/2016, 08/14/2016, 09/04/2016, and 10/02/2016).  Cytology was negative on 07/24/2016 and 09/04/2016.  Symptomatically, he denies any B symptoms.  He is eating well.  He is gaining weight.  Exam reveals no adenopathy of hepatosplenomegaly.  Plan: 1.  Review PET scan.  Discuss concern for early progression.  Discuss treatment  options.  Unclear if patient would be candidate for autologous stem cell transplant. 2.  Present at tumor board on 10/16/2016. 3.  Phone follow-up with renal transplant team Matthew Brown). 4.  RTC in 1 week for MD assessment and decision regarding direction of therapy.   Lequita Asal, MD  10/12/2016, 9:45 AM

## 2016-10-13 ENCOUNTER — Inpatient Hospital Stay: Payer: Medicare HMO | Admitting: Hematology and Oncology

## 2016-10-14 ENCOUNTER — Encounter: Payer: Self-pay | Admitting: Hematology and Oncology

## 2016-10-14 ENCOUNTER — Inpatient Hospital Stay (HOSPITAL_BASED_OUTPATIENT_CLINIC_OR_DEPARTMENT_OTHER): Payer: Medicare HMO | Admitting: Hematology and Oncology

## 2016-10-14 VITALS — BP 135/55 | HR 60 | Temp 96.7°F | Resp 18 | Wt 129.2 lb

## 2016-10-14 DIAGNOSIS — I129 Hypertensive chronic kidney disease with stage 1 through stage 4 chronic kidney disease, or unspecified chronic kidney disease: Secondary | ICD-10-CM

## 2016-10-14 DIAGNOSIS — I2510 Atherosclerotic heart disease of native coronary artery without angina pectoris: Secondary | ICD-10-CM | POA: Diagnosis not present

## 2016-10-14 DIAGNOSIS — E785 Hyperlipidemia, unspecified: Secondary | ICD-10-CM

## 2016-10-14 DIAGNOSIS — E041 Nontoxic single thyroid nodule: Secondary | ICD-10-CM

## 2016-10-14 DIAGNOSIS — Z940 Kidney transplant status: Secondary | ICD-10-CM

## 2016-10-14 DIAGNOSIS — I517 Cardiomegaly: Secondary | ICD-10-CM

## 2016-10-14 DIAGNOSIS — Z7189 Other specified counseling: Secondary | ICD-10-CM

## 2016-10-14 DIAGNOSIS — D47Z1 Post-transplant lymphoproliferative disorder (PTLD): Secondary | ICD-10-CM | POA: Diagnosis not present

## 2016-10-14 DIAGNOSIS — R413 Other amnesia: Secondary | ICD-10-CM

## 2016-10-14 DIAGNOSIS — C8330 Diffuse large B-cell lymphoma, unspecified site: Secondary | ICD-10-CM

## 2016-10-14 DIAGNOSIS — Z79899 Other long term (current) drug therapy: Secondary | ICD-10-CM

## 2016-10-14 DIAGNOSIS — K5730 Diverticulosis of large intestine without perforation or abscess without bleeding: Secondary | ICD-10-CM

## 2016-10-14 DIAGNOSIS — J90 Pleural effusion, not elsewhere classified: Secondary | ICD-10-CM

## 2016-10-14 DIAGNOSIS — R51 Headache: Secondary | ICD-10-CM

## 2016-10-14 DIAGNOSIS — M549 Dorsalgia, unspecified: Secondary | ICD-10-CM

## 2016-10-14 DIAGNOSIS — N186 End stage renal disease: Secondary | ICD-10-CM

## 2016-10-14 DIAGNOSIS — C787 Secondary malignant neoplasm of liver and intrahepatic bile duct: Secondary | ICD-10-CM

## 2016-10-14 DIAGNOSIS — N400 Enlarged prostate without lower urinary tract symptoms: Secondary | ICD-10-CM

## 2016-10-14 DIAGNOSIS — Z800 Family history of malignant neoplasm of digestive organs: Secondary | ICD-10-CM

## 2016-10-14 DIAGNOSIS — I272 Pulmonary hypertension, unspecified: Secondary | ICD-10-CM

## 2016-10-14 DIAGNOSIS — G8929 Other chronic pain: Secondary | ICD-10-CM

## 2016-10-14 DIAGNOSIS — M109 Gout, unspecified: Secondary | ICD-10-CM

## 2016-10-14 DIAGNOSIS — Z87891 Personal history of nicotine dependence: Secondary | ICD-10-CM

## 2016-10-14 DIAGNOSIS — E210 Primary hyperparathyroidism: Secondary | ICD-10-CM

## 2016-10-14 DIAGNOSIS — N529 Male erectile dysfunction, unspecified: Secondary | ICD-10-CM

## 2016-10-14 DIAGNOSIS — I481 Persistent atrial fibrillation: Secondary | ICD-10-CM

## 2016-10-14 DIAGNOSIS — K219 Gastro-esophageal reflux disease without esophagitis: Secondary | ICD-10-CM

## 2016-10-14 LAB — CYTOLOGY - NON PAP

## 2016-10-14 NOTE — Progress Notes (Signed)
Patient states at his last LP he felt twice like he had electric shock down his left leg.  Stated it bothered him all last weekend.  Offers no complaints today.

## 2016-10-14 NOTE — Progress Notes (Signed)
[facility] [facility] day:  10/14/2016  Chief Complaint: Matthew Brown is a 76 y.o. male with post-transplant lymphoproliferative disorder, stage IVBE diffuse large B cell lymphoma, who is seen for review of PET scan after 6 cycles of mini-RCHOP.  HPI:  The patient was last seen in the medical oncology clinic on 10/01/2016.  At that time, he was doing well.  He denied any B symptoms.  He was eating well.  Weight was stable.  Memory was poor.  He underwent LP with MTX on 10/02/2016.  Cell count revealed 82 RBCs and 0 WBCs.  Glucose was 56.  Protein was 39.  Cytology is pending.  PET scan was ordered but delayed secondary to insurance approval.    PET scan on 10/10/2016 revealed slight worsening compared to the prior study. Several lesions were slightly more hypermetabolic and there appeared to be a newly enlarged and hypermetabolic mesenteric node adjacent to one of the previous mesenteric lymph nodes. However, there are no new hypermetabolic lesions in the neck, chest, or skeleton.  The small hypermetabolic peripheral lesion of the right kidney upper pole is slightly less hypermetabolic than before.  There was emphysema and aortic atherosclerosis.  There was a photopenic structure adjacent to the gallbladder appears complex in could be a complex cyst, a venous varix in the liver, or a vascular malformation in the liver.  There was a small right pleural effusion, reduced in size.  There was cardiomegaly, oronary atherosclerosis, mldly prominent thyroid gland with a non hypermetabolic right thyroid nodule, severe bilateral native renal atrophy with a transplant kidney in the right pelvis, sigmoid diverticulosis, and pelvic floor laxity.  Symptomatically, he is doing well.  He denies any fevers, sweats or weight loss.  He is still not gaining weight like he thinks he should.  He is eating well.  He has gained 2 pounds since his last visit.   Past Medical History:   Diagnosis Date  . Benign prostatic hypertrophy   . Chronic headache 10/19/2015  . ED (erectile dysfunction)   . End stage renal disease ([place])   . Essential hypertension   . GERD (gastroesophageal reflux disease)   . GIB (gastrointestinal bleeding)    a. 09/979 s/p R colic artery embolization;  b. 02/2015 EGD: duod ulcerative mass->Bx notable for coagulative necrosis - ? ischemia vs thrombosis-->coumadin d/c'd.  . Gout   . Hearing loss   . Hemorrhoids   . Hyperlipidemia   . Lymphoma ([place])   . Lymphoma ([place]) 2017  . Multiple thyroid nodules 06/06/2016   Noted on carotid US; dedicated US to be ordered by staff  . Osteoarthrosis, unspecified whether generalized or localized, lower leg   . Persistent atrial fibrillation ([place])    a. CHA2DS2VASc = 3-->coumadin d/c'd 02/2015 2/2 recurrent GIB.  [name] Prostatitis   . Pulmonary hypertension ([place])    a. 10/2014 Echo: EF 60-65%, mild to mod MR, mildly dil LA, nl RV, PASP 42mmHg.  [name] Renal transplant recipient   . Ulcers of both great toes [facility])     Past Surgical History:  Procedure Laterality Date  . BACK SURGERY    . ESOPHAGOGASTRODUODENOSCOPY  03/13/15   severe esophagitis, ulcerated mass  . ESOPHAGOGASTRODUODENOSCOPY (EGD) WITH PROPOFOL N/A 07/09/2016   Procedure: ESOPHAGOGASTRODUODENOSCOPY (EGD) WITH PROPOFOL;  Surgeon: [name], MD;  Location: [facility] ENDOSCOPY;  Service: Endoscopy;  Laterality: N/A;  . HERNIA REPAIR  1974  . PERIPHERAL VASCULAR CATHETERIZATION N/A 05/07/2016   Procedure: Glori Luis Cath  Insertion;  Surgeon: Algernon Huxley, MD;  Location: [facility];  Service: Cardiovascular;  Laterality: N/A;  . PROSTATE ABLATION    . STOMACH SURGERY     blood vessel burst  . THROAT SURGERY    . TOTAL KNEE ARTHROPLASTY      Family History  Problem Relation Age of Onset  . Cancer Mother     throat  . Diabetes Brother   . Heart disease Brother   . Stroke Brother   . Hypertension Brother   . Diabetes Sister   . Heart disease  Sister   . Hypertension Sister   . Diabetes Sister   . Diabetes Brother   . COPD Neg Hx   . Kidney disease Neg Hx   . Prostate cancer Neg Hx     Social History:  reports that he quit smoking about 38 years ago. His smoking use included Cigarettes. He has a 25.00 pack-year smoking history. He has never used smokeless tobacco. He reports that he does not drink alcohol or use drugs.  He stopped smoking in 1981.  He smoked 3 cigarettes/day.  He lives in [place].  The patient is accompanied by his wife, [name],  today.  Allergies: No Known Allergies  Current Medications: Current Outpatient Prescriptions  Medication Sig Dispense Refill  . acetaminophen (TYLENOL) 325 MG tablet Take 2 tablets (650 mg total) by mouth every 6 (six) hours as needed for mild pain (or Fever >/= 101).    [name] albuterol (PROAIR HFA) 108 (90 BASE) MCG/ACT inhaler Inhale 1-2 puffs into the lungs every 4 (four) hours as needed.     [name] allopurinol (ZYLOPRIM) 100 MG tablet Take 100 mg by mouth daily.      [name] COLCRYS 0.6 MG tablet Take 1 tablet by mouth 2 (two) times daily as needed.    . diphenhydrAMINE (BENADRYL) 25 mg capsule Take 1 capsule (25 mg total) by mouth at bedtime as needed for sleep. 30 capsule 0  . feeding supplement, ENSURE ENLIVE, (ENSURE ENLIVE) LIQD Take 237 mLs by mouth 3 (three) times daily between meals. 90 Bottle 0  . finasteride (PROSCAR) 5 MG tablet Take 1 tablet (5 mg total) by mouth daily. 90 tablet 3  . furosemide (LASIX) 20 MG tablet Take 20 mg by mouth daily. Take 1-2 tablets daily.    . hydroxypropyl methylcellulose (ISOPTO TEARS) 2.5 % ophthalmic solution Place 1 drop into both eyes as needed.     . Multiple Vitamin (MULTIVITAMIN) tablet Take 1 tablet by mouth daily.      . ondansetron (ZOFRAN) 4 MG tablet Take 1 tablet (4 mg total) by mouth every 6 (six) hours as needed for nausea. 20 tablet 0  . oxybutynin (DITROPAN-XL) 5 MG 24 hr tablet Take 1 tablet (5 mg total) by mouth daily. 90 tablet 3  .  oxyCODONE-acetaminophen (ROXICET) 5-325 MG tablet Take 1 tablet by mouth every 6 (six) hours as needed for severe pain. 20 tablet 0  . pantoprazole (PROTONIX) 40 MG tablet TAKE 1 TABLET (40 MG TOTAL) BY MOUTH 2 (TWO) TIMES DAILY. 60 tablet 1  . predniSONE (DELTASONE) 20 MG tablet as directed.     . predniSONE (DELTASONE) 5 MG tablet Take 5 mg by mouth daily.     . simethicone (MYLICON) 80 MG chewable tablet Chew 1 tablet (80 mg total) by mouth every 6 (six) hours as needed for flatulence. 120 tablet 0  . sucralfate (CARAFATE) 1 g tablet Take 1 tablet (1 g total) by  mouth 4 (four) times daily. Resume taking after one week- once finished taking oral levaquine. ( to avoid interaction.) 40 tablet 3  . tacrolimus (PROGRAF) 1 MG capsule Take 3 mg by mouth 2 (two) times daily. Reported on 08/16/2015    . tamsulosin (FLOMAX) 0.4 MG CAPS capsule Take 1 capsule (0.4 mg total) by mouth daily. 90 capsule 3  . traZODone (DESYREL) 50 MG tablet 1-2 TABS AS NEEDED FOR SLEEP  3   No current facility-administered medications for this visit.    Facility-Administered Medications Ordered in Other Visits  Medication Dose Route Frequency Provider Last Rate Last Dose  . methotrexate (PF) 12 mg in sodium chloride 0.9 % INTRATHECAL chemo injection   Intrathecal Once Corcoran, Melissa C, MD      . methotrexate (PF) 12 mg in sodium chloride 0.9 % INTRATHECAL chemo injection   Intrathecal Once Lequita Asal, MD        Review of Systems:  GENERAL:  Feels "good".  No fevers or sweats.  Weight up 2 pounds. PERFORMANCE STATUS (ECOG):  1 HEENT:  No visual changes, sore throat, mouth sores or tenderness. Lungs: No shortness of breath or cough.  No hemoptysis. Cardiac:  No chest pain, palpitations, orthopnea, or PND. GI: Eating well.  No nausea, vomiting, diarrhea, constipation, melena or hematochezia. GU:  Enlarged prostate.  No urgency, frequency, dysuria, or hematuria. Musculoskeletal:  No back pain.  No joint pain.   No muscle tenderness. Extremities:  No pain or swelling. Skin:  Nail changes.  No rashes or skin changes. Neuro:  No headache, numbness or weakness, balance or coordination issues. Endocrine:  No diabetes, thyroid issues, hot flashes or night sweats. Psych:  No mood changes, depression or anxiety. Pain:  No pain. Review of systems:  All other systems reviewed and found to be negative.  Physical Exam: Blood pressure (!) 135/55, pulse 60, temperature (!) 96.7 F (35.9 C), temperature source Tympanic, resp. rate 18, weight 129 lb 4 oz (58.6 kg). GENERAL:  Thin elderly gentleman sitting comfortably in a wheelchair in the exam room in no acute distress.  MENTAL STATUS:  Alert and oriented to person, place and time. HEAD:  Lu Duffel.  Temporal wasting.  Normocephalic, atraumatic, face symmetric, no Cushingoid features. EYES:  Glasses.  Brown eyes.  Pupils equal round and reactive to light and accomodation.  No conjunctivitis or scleral icterus. ENT:  Oropharynx clear without lesion.  Edentulous.  Tongue normal. Mucous membranes moist.  RESPIRATORY:  Clear to auscultation without rales, wheezes or rhonchi. CARDIOVASCULAR:  Regular rate and rhythm without murmur, rub or gallop. ABDOMEN:  Soft, non-tender, with active bowel sounds, and no hepatosplenomegaly.  No masses.   SKIN:  No rashes, ulcers or lesions. EXTREMITIES:  No edema, no skin discoloration or tenderness.  Right upper extremity with dilated vascular s/p AV fistula (chronic).  No palpable cords. NEUROLOGICAL: Unremarkable. PSYCH:  Appropriate.   Imaging studies:  04/28/2016 PET scan:  Bulky intensely hypermetabolic periaortic upper abdominal and mesenteric adenopathy concerning for high-grade lymphoma.  There was hypermetabolic liver metastasis.  There was hpermetabolic lesion involving the small bowel of the upper pelvis.  There were multiple sites of hypermetabolic skeletal metastasis. There was moderate volume right  pneumothorax. 08/12/2016 PET scan:  Interval response to therapy. There has been significant decrease in extent of hypermetabolic tumor within the neck, chest, abdomen and pelvis as well as the axial and appendicular skeleton.  There was residual enlarged and hypermetabolic small bowel mesenteric and periaortic lymph  nodes. There was a persistent hypermetabolic focus of increased uptake within the spleen.  There was resolution of previous multifocal hypermetabolic lesions within the liver. 10/10/2016 PET scan:  Slight worsening compared to the prior study. Several lesions were slightly more hypermetabolic and there appeared to be a newly enlarged and hypermetabolic mesenteric node adjacent to one of the previous mesenteric lymph nodes. However, there are no new hypermetabolic lesions in the neck, chest, or skeleton.  The small hypermetabolic peripheral lesion of the right kidney upper pole is slightly less hypermetabolic.   No visits with results within 3 Day(s) from this visit.  Latest known visit with results is:  Hospital Outpatient Visit on 10/10/2016  Component Date Value Ref Range Status  . Glucose-Capillary 10/10/2016 77  65 - 99 mg/dL Final    Assessment:  AVONTE SENSABAUGH is a 76 y.o. male s/p renal transplant (2007) with a post-transplant lymphoproliferative disorder, stage IV diffuse large B cell lymphoma.  He presented with a 2-3 month history of progressive back pain superimposed on chronic back pain.    PET scan on 04/28/2016 revealed bulky intensely hypermetabolic periaortic upper abdominal and mesenteric adenopathy concerning for high-grade lymphoma.  There was hypermetabolic liver metastasis.  There was hpermetabolic lesion involving the small bowel of the upper pelvis.  There were multiple sites of hypermetabolic skeletal metastasis. There was moderate volume right pneumothorax.  CT guided retroperitoneal node biopsy on 04/30/2016 revealed diffuse large B cell lymphoma.  Hepatitis B and  C testing on 05/06/2016 were negative.  Echo on 05/06/2016 revealed an EF of 55-60%.  Echo on 09/01/2016 revealed an EF of 60-65%.  Bone marrow aspirate and biopsy on 05/08/2016 revealed multifocal marrow involvement by diffuse large B-cell lymphoma. There was variably cellular marrow for age (50% - 90%) with a patchy predominantly nodular large B-cell infiltrate, overall estimated to account for 20% of the core biopsy. There was adequate residual trilineage hematopoiesis with mild nonspecific dyserythropoiesis. There was patchy mild increase in reticulin. Storage iron was present. The immunohistochemical staining pattern of the B-cell infiltrate (CD10 +/-, BCL 6+, BCL-2 +) suggested possible large cell transformation of follicular lymphoma.  Flow cytometry revealed no significant immunophenotypic abnormalities or evidence of B-cell lymphoma.  He has bone metastasis and hypercalcemia.  Calcium was 11.2 (ionized 7.3) on 05/13/2016.  He received Zometa on 05/13/2016.  He began [place] on 06/09/2016 (last 08/05/2016).  Work-up on 04/15/2016 revealed the following normal studies: ferritin (343), iron saturation (6%), TIBC (241; low), B12 (525), folate (27).  Reticulocyte count was 2%.  LDH was 409.  Uric acid was 7.7 (4.4 - 7.6).  He was admitted at [facility] from 04/28/2016 - 04/30/2016 with a moderate volume right sided pneumothorax.  His pneumothorax improved spontaneously. Plain films of the right femur revealed the lucent bone lesion in the right femoral neck  was poorly characterized.  There was no evidence for an acute fracture.  PTH was 106 (high) 04/30/2016 with a calcium of 11.2.  Etiology was c/w primary hyperparathyroidism.  PTH-related polypeptide was < 1.1 on 04/30/2016.  He has a history of GI bleeding in 08/2014.  He underwent tagged RBC scan which revealed an active bleed in the hepatic flexure.  He was embolized in vascular interventional radiology.  He was admitted to [facility] from 07/08/2016 -  07/13/2016 with GI bleeding.  EGD on 07/09/2016 revealed multiple non-bleeding duodenal ulcer as well as duodenitis. Tagged RBC scan on 07/11/2016 revealed active GI bleed in the lateral right mid abdomen coursing through  multiple curvilinear bowel loops in the right mid abdomen favoring a small bowel source of bleeding.  He received 5 units of PRBCs, 2 units of pheresed platelets, and vitamin K from 07/07/2016 0 07/13/2016.  He was transferred to [facility] from 07/13/2016 - 07/16/2016.  [facility] performed a colonoscopy and push enteroscopy which showed several diverticula and old blood but no source of bleeding. Source of bleed was presumed to be diverticular, although it was possible a small bowel site was not visualized on endoscopy.  He has a history of renal failure s/p renal transplant.  He is tacrolimus (Prograf), and steroids  Creatinine has ranged between 1.43 - 1.93 in the past 6 months. Mycophenolate (MMF) was discontinued at diagnosis.  Prograf level was 5.5 (3.0-8.0) on 05/08/2016 and 2.2 on 08/13/2016.   He received 6 cycles of mini-RCHOP (05/09/2016 - 09/05/2016).  Cycle #1 was complicated by fever and neutropenia.  All cultures were negative.  He was treated empirically with Cefepime.  Peripheral smear revealed schistocytes.  Cycle #3 was complicated by a GI bleed.  PET scan on 10/10/2016 revealed slight worsening compared to the prior study. Several lesions were slightly more hypermetabolic and there appeared to be a newly enlarged and hypermetabolic mesenteric node adjacent to one of the previous mesenteric lymph nodes. However, there are no new hypermetabolic lesions in the neck, chest, or skeleton.  The small hypermetabolic peripheral lesion of the right kidney upper pole is slightly less hypermetabolic.  CSF on 06/19/2016 revealed 7 WBCs (4% segs, 79% lymphs, and 17% monocytes).  He has undergone LP with IT MTX x 4 (07/24/2016, 08/14/2016, 09/04/2016, and 10/02/2016).  Cytology was negative on  07/24/2016 and 09/04/2016.  Symptomatically, he denies any B symptoms.  He is eating well.  He is gaining weight.  Exam reveals no adenopathy of hepatosplenomegaly.  Plan: 1.  Review PET scan.  Discuss concern for early progression.  Discuss treatment options.  Discuss salvage chemotherapy (RICE, DHAP +/- Rituxan, GemOx +/- Rituxan) followed by autologous stem cell transplant.  Unclear if patient would be candidate for autologous stem cell transplant.  Consider Rituxan alone. 2.  Present at tumor board on 10/16/2016. 3.  Phone follow-up with renal transplant team Kellie Shropshire True). 4.  Anticipate evaluation at [facility] (nephrology and oncology). 5.  RTC after above.  Addendum:  Cytology from 10/02/2016 was acellular.  Cell count revealed 0 WBCs.   Lequita Asal, MD  10/14/2016

## 2016-11-21 ENCOUNTER — Telehealth: Payer: Self-pay | Admitting: Urology

## 2016-11-21 NOTE — Telephone Encounter (Signed)
I need more information regarding patient's prostate pain.  Please call the patient to get more clinical information.  Is he having fevers, can he urinate, is he constipation and more description of prostate pain.

## 2016-11-21 NOTE — Telephone Encounter (Signed)
Matthew Brown, CMA spoke to patient.  He describes his pain as being located in the front, near his groin area on both sides.  He denies a fever, but experiencing chills.  He is not having any problems urinating.    Appointment made to be seen in our office on Monday afternoon with Matthew Brown.    Matthew Brown advised the patient to contact the cancer center and his kidney provider with concerns about pain and possible inflamed lymph nodes in the groin area and to discuss possible pain medications due to having only one kidney.

## 2016-11-21 NOTE — Telephone Encounter (Signed)
Patient's wife called the office today requesting a same day appointment.  She states that the patient is experiencing prostate pain.  If the patient cannot be seen today, she is requesting "something to be called into the pharmacy to get him through the weekend" until he can be seen next week.    Sent message to Zara Council requesting advice.

## 2016-11-21 NOTE — Telephone Encounter (Signed)
Sounds like a good plan.  I will see him Monday.

## 2016-11-23 NOTE — Progress Notes (Signed)
2:34 PM   TALLON GERTZ May 09, 1941 [id number]  Referring provider: Arnetha Courser, MD [address] [place], [place] [address]  Chief Complaint  Patient presents with  . Follow-up    BPH  patient having prostate pain last seen 03/18    HPI: 76 yo AAM who presents today requesting an urgent appointment for prostate pain.  He states for about 1 week he's been having pain in his left lower quadrants.  He states just recently he has noted a lump in the area.  He states sometimes the lump will go away when he lays down.  He states that the area can become quite painful.   7/10 pain.   The pain can last for several hours.  Nothing makes the pain worse or better.  He denies any fevers, chills, nausea or vomiting. He has not had any constipation or diarrhea.  His IPSS score today is 1, which is mild lower urinary tract symptomatology. He is mostly satisfied with his quality life due to his urinary symptoms.  His PVR is 0 mL.  His previous IPSS score was 0/2.   He denies any dysuria, hematuria or suprapubic pain.   He currently taking tamsulosin 0.4 mg daily and finasteride 5 mg daily.    He also denies any recent fevers, chills, nausea or vomiting.  His UA was unremarkable.        IPSS    Row Name 11/24/16 1300         International Prostate Symptom Score   How often have you had the sensation of not emptying your bladder? Not at All     How often have you had to urinate less than every two hours? Not at All     How often have you found you stopped and started again several times when you urinated? Not at All     How often have you found it difficult to postpone urination? Not at All     How often have you had a weak urinary stream? Not at All     How often have you had to strain to start urination? Not at All     How many times did you typically get up at night to urinate? 1 Time     Total IPSS Score 1       Quality of Life due to urinary symptoms   If you were to spend  the rest of your life with your urinary condition just the way it is now how would you feel about that? Mostly Satisfied        Score:  1-7 Mild 8-19 Moderate 20-35 Severe   PMH: Past Medical History:  Diagnosis Date  . Benign prostatic hypertrophy   . Chronic headache 10/19/2015  . ED (erectile dysfunction)   . End stage renal disease ([place])   . Essential hypertension   . GERD (gastroesophageal reflux disease)   . GIB (gastrointestinal bleeding)    a. 12/4161 s/p R colic artery embolization;  b. 02/2015 EGD: duod ulcerative mass->Bx notable for coagulative necrosis - ? ischemia vs thrombosis-->coumadin d/c'd.  . Gout   . Hearing loss   . Hemorrhoids   . Hyperlipidemia   . Lymphoma ([place])   . Lymphoma ([place]) 2017  . Multiple thyroid nodules 06/06/2016   Noted on carotid US; dedicated US to be ordered by staff  . Osteoarthrosis, unspecified whether generalized or localized, lower leg   . Persistent atrial fibrillation ([place])    a.  CHA2DS2VASc = 3-->coumadin d/c'd 02/2015 2/2 recurrent GIB.  [name] Prostatitis   . Pulmonary hypertension ([place])    a. 10/2014 Echo: EF 60-65%, mild to mod MR, mildly dil LA, nl RV, PASP 54mmHg.  [name] Renal transplant recipient   . Ulcers of both great toes [facility])     Surgical History: Past Surgical History:  Procedure Laterality Date  . BACK SURGERY    . ESOPHAGOGASTRODUODENOSCOPY  03/13/15   severe esophagitis, ulcerated mass  . ESOPHAGOGASTRODUODENOSCOPY (EGD) WITH PROPOFOL N/A 07/09/2016   Procedure: ESOPHAGOGASTRODUODENOSCOPY (EGD) WITH PROPOFOL;  Surgeon: [name], MD;  Location: [facility] ENDOSCOPY;  Service: Endoscopy;  Laterality: N/A;  . HERNIA REPAIR  1974  . PERIPHERAL VASCULAR CATHETERIZATION N/A 05/07/2016   Procedure: Glori Luis Cath Insertion;  Surgeon: Algernon Huxley, MD;  Location: [place] CV LAB;  Service: Cardiovascular;  Laterality: N/A;  . PROSTATE ABLATION    . STOMACH SURGERY     blood vessel burst  . THROAT SURGERY    . TOTAL KNEE  ARTHROPLASTY      Home Medications:  Allergies as of 11/24/2016   No Known Allergies     Medication List       Accurate as of 11/24/16  2:34 PM. Always use your most recent med list.          acetaminophen 325 MG tablet Commonly known as:  TYLENOL Take 2 tablets (650 mg total) by mouth every 6 (six) hours as needed for mild pain (or Fever >/= 101).   allopurinol 100 MG tablet Commonly known as:  ZYLOPRIM Take 100 mg by mouth daily.   ciprofloxacin 500 MG tablet Commonly known as:  CIPRO Take 500 mg by mouth.   COLCRYS 0.6 MG tablet Generic drug:  colchicine Take 1 tablet by mouth 2 (two) times daily as needed.   diphenhydrAMINE 25 mg capsule Commonly known as:  BENADRYL Take 1 capsule (25 mg total) by mouth at bedtime as needed for sleep.   feeding supplement (ENSURE ENLIVE) Liqd Take 237 mLs by mouth 3 (three) times daily between meals.   finasteride 5 MG tablet Commonly known as:  PROSCAR Take 1 tablet (5 mg total) by mouth daily.   furosemide 20 MG tablet Commonly known as:  LASIX Take 20 mg by mouth every other day. Take 1-2 tablets daily.   hydroxypropyl methylcellulose 2.5 % ophthalmic solution Commonly known as:  ISOPTO TEARS Place 1 drop into both eyes as needed.   metoprolol tartrate 25 MG tablet Commonly known as:  LOPRESSOR Take 12.5 mg by mouth 2 (two) times daily.   multivitamin tablet Take 1 tablet by mouth daily.   ondansetron 4 MG tablet Commonly known as:  ZOFRAN Take 1 tablet (4 mg total) by mouth every 6 (six) hours as needed for nausea.   oxybutynin 5 MG 24 hr tablet Commonly known as:  DITROPAN-XL Take 1 tablet (5 mg total) by mouth daily.   oxyCODONE-acetaminophen 5-325 MG tablet Commonly known as:  ROXICET Take 1 tablet by mouth every 6 (six) hours as needed for severe pain.   pantoprazole 40 MG tablet Commonly known as:  PROTONIX TAKE 1 TABLET (40 MG TOTAL) BY MOUTH 2 (TWO) TIMES DAILY.   predniSONE 5 MG tablet Commonly  known as:  DELTASONE Take 5 mg by mouth daily.   predniSONE 20 MG tablet Commonly known as:  DELTASONE as directed.   PROAIR HFA 108 (90 Base) MCG/ACT inhaler Generic drug:  albuterol Inhale 1-2 puffs into the lungs every 4 (  four) hours as needed.   simethicone 80 MG chewable tablet Commonly known as:  MYLICON Chew 1 tablet (80 mg total) by mouth every 6 (six) hours as needed for flatulence.   sucralfate 1 g tablet Commonly known as:  CARAFATE Take 1 tablet (1 g total) by mouth 4 (four) times daily. Resume taking after one week- once finished taking oral levaquine. ( to avoid interaction.)   tacrolimus 1 MG capsule Commonly known as:  PROGRAF Take 3 mg by mouth 2 (two) times daily. Reported on 08/16/2015   tamsulosin 0.4 MG Caps capsule Commonly known as:  FLOMAX Take 1 capsule (0.4 mg total) by mouth daily.   traZODone 50 MG tablet Commonly known as:  DESYREL 1-2 TABS AS NEEDED FOR SLEEP       Allergies: No Known Allergies  Family History: Family History  Problem Relation Age of Onset  . Cancer Mother        throat  . Diabetes Brother   . Heart disease Brother   . Stroke Brother   . Hypertension Brother   . Diabetes Sister   . Heart disease Sister   . Hypertension Sister   . Diabetes Sister   . Diabetes Brother   . COPD Neg Hx   . Kidney disease Neg Hx   . Prostate cancer Neg Hx   . Kidney cancer Neg Hx     Social History:  reports that he quit smoking about 38 years ago. His smoking use included Cigarettes. He has a 25.00 pack-year smoking history. He has never used smokeless tobacco. He reports that he does not drink alcohol or use drugs.  ROS: UROLOGY Frequent Urination?: Yes Hard to postpone urination?: No Burning/pain with urination?: No Get up at night to urinate?: No Leakage of urine?: No Urine stream starts and stops?: No Trouble starting stream?: No Do you have to strain to urinate?: No Blood in urine?: No Urinary tract infection?:  No Sexually transmitted disease?: No Injury to kidneys or bladder?: No Painful intercourse?: No Weak stream?: No Erection problems?: No Penile pain?: No  Gastrointestinal Nausea?: No Vomiting?: No Indigestion/heartburn?: No Diarrhea?: Yes Constipation?: No  Constitutional Fever: Yes Night sweats?: No Weight loss?: No Fatigue?: No  Skin Skin rash/lesions?: No Itching?: No  Eyes Blurred vision?: No Double vision?: No  Ears/Nose/Throat Sore throat?: No Sinus problems?: Yes  Hematologic/Lymphatic Swollen glands?: Yes Easy bruising?: No  Cardiovascular Leg swelling?: No Chest pain?: No  Respiratory Cough?: No Shortness of breath?: No  Endocrine Excessive thirst?: No  Musculoskeletal Back pain?: No Joint pain?: No  Neurological Headaches?: No Dizziness?: No  Psychologic Depression?: No Anxiety?: No  Physical Exam: BP (!) 162/68   Pulse 99   Temp 98.7 F (37.1 C) (Oral)   Ht 5\' 6"  (1.676 m)   Wt 126 lb 1.6 oz (57.2 kg)   BMI 20.35 kg/m   Constitutional: Well nourished. Alert and oriented, No acute distress. HEENT: [place] AT, moist mucus membranes. Trachea midline, no masses. Cardiovascular: No clubbing, cyanosis, or edema. Respiratory: Normal respiratory effort, no increased work of breathing. GI: Abdomen is soft, non tender, non distended, no abdominal masses. Liver and spleen not palpable.  Left inguinal hernia that is easily reducible.  Stool sample for occult testing is not indicated.   GU: No CVA tenderness.  No bladder fullness or masses.  Patient with uncircumcised phallus. Foreskin easily retracted Urethral meatus is patent.  No penile discharge. No penile lesions or rashes. Scrotum without lesions, cysts, rashes and/or edema.  Testicles are located scrotally bilaterally. No masses are appreciated in the testicles. Left and right epididymis are normal. Rectal: Not performed. Skin: No rashes, bruises or suspicious lesions. Lymph: No cervical  or inguinal adenopathy. Neurologic: Grossly intact, no focal deficits, moving all 4 extremities. Psychiatric: Normal mood and affect.  Laboratory Data: PSA History  0.36 ng/mL on 08/07/2015  Assessment & Plan:    1. Left inguinal hernia  - Reducible at this time  - Referred to general surgery for further evaluation and management  - reviewed red flags signs and symptoms (high fevers, chills, unable to reduce the hernia, nausea, vomiting and/or intense pain)  2. BPH with LUTS  - IPSS score is 0/2, it is stable/improving/worsening  - Continue conservative management, avoiding bladder irritants and timed voiding's  - Continue tamsulosin 0.4 mg daily and finasteride 5 mg daily  - not the cause of his pain  - RTC in 12 months for IPSS and exam   Return for refer to general surgery.  These notes generated with voice recognition software. I apologize for typographical errors.  Zara Council, [facility] [address], [place],  [address] [phone]

## 2016-11-24 ENCOUNTER — Encounter: Payer: Self-pay | Admitting: Urology

## 2016-11-24 ENCOUNTER — Ambulatory Visit: Payer: Medicare HMO | Admitting: Urology

## 2016-11-24 VITALS — BP 162/68 | HR 99 | Temp 98.7°F | Ht 66.0 in | Wt 126.1 lb

## 2016-11-24 DIAGNOSIS — K4090 Unilateral inguinal hernia, without obstruction or gangrene, not specified as recurrent: Secondary | ICD-10-CM | POA: Diagnosis not present

## 2016-11-24 DIAGNOSIS — N138 Other obstructive and reflux uropathy: Secondary | ICD-10-CM

## 2016-11-24 DIAGNOSIS — N400 Enlarged prostate without lower urinary tract symptoms: Secondary | ICD-10-CM

## 2016-11-24 DIAGNOSIS — N401 Enlarged prostate with lower urinary tract symptoms: Secondary | ICD-10-CM | POA: Diagnosis not present

## 2016-11-24 LAB — URINALYSIS, COMPLETE
Bilirubin, UA: NEGATIVE
Glucose, UA: NEGATIVE
KETONES UA: NEGATIVE
LEUKOCYTES UA: NEGATIVE
Nitrite, UA: NEGATIVE
PH UA: 5.5 (ref 5.0–7.5)
SPEC GRAV UA: 1.015 (ref 1.005–1.030)
Urobilinogen, Ur: 1 mg/dL (ref 0.2–1.0)

## 2016-11-24 LAB — BLADDER SCAN AMB NON-IMAGING: Scan Result: 0

## 2016-11-24 LAB — MICROSCOPIC EXAMINATION
BACTERIA UA: NONE SEEN
EPITHELIAL CELLS (NON RENAL): NONE SEEN /HPF (ref 0–10)
RBC MICROSCOPIC, UA: NONE SEEN /HPF (ref 0–?)

## 2016-11-26 ENCOUNTER — Telehealth: Payer: Self-pay | Admitting: General Surgery

## 2016-11-26 NOTE — Telephone Encounter (Signed)
TRIED TO REACH PATIENT AT H# 11-25-16 NO ANSWER & UNABLE TO LEAVE MESSAGE. TODAY 11-26-16 TRIED AGAIN TO REACH PATIENT BY H# AGAIN NO ANSWER & UNABLE TO LEAVE MESSAGE.I CALLED THE CELL # & IT WAS PATIENTS GRANDAUGHTER(JASMIN THAXTON) SHE WILL GIVE PATIENT THE MESSAGE TO CALL & MAKE AN APPOINTMENT WITH DR BYRNETT(ON CALL)FOR LT INGUINAL HERNIA REF'D BY [facility] PA-C/[employer]

## 2016-11-28 ENCOUNTER — Telehealth: Payer: Self-pay | Admitting: *Deleted

## 2016-11-28 NOTE — Telephone Encounter (Signed)
Called and discussed with Significant other that if the patient was not going to go to [facility] then we could get an appt here.  She reports that they are going to [facility] and that she was calling them for an appt as soon as their car was repaired because that is why they cancelled.  Instructed her to call back to cancer center if they decided not to go to [facility] so we could get them an appt here.

## 2016-12-09 ENCOUNTER — Other Ambulatory Visit: Payer: Self-pay | Admitting: Hematology and Oncology

## 2016-12-22 NOTE — Unmapped (Addendum)
Assessment and Plan:      Assessment/Plan:      Subjective:       Taylor Ramos is a 76 y.o. male with a history of ESRD s/p transplant in 2006, stage 4 B cell lymphoma with bone metastases (last chemotherapy was in April/May per patient; patient says he was told he still has mets to the bone and liver), HTN, gout, h/o pneumonia, h/o pancreatitis, atrial fibrillation, multiple GI bleeds who is being seen today in new consultation at the request of Dr. Lucienne Minks True MD for evaluation of atrial fibrillation. Taylor Ramos is accompanied by his fiance to clinic. He has had multiple GI bleeding episodes (in 2017, his fiance says his blood vessel in his stomach burst.).  In 06/2016, he was admitted for upper GI bleed and received 2u PRBC. EGD showed nonbleeding duodenal ulcers. Hemoglobin dropped to 5, he had melanotic stools, and he was hypotensive. He was transferred to ICU at that time and tagged RBC scan showed possible small bowel bleed. Colonoscopy showed nonbleeding diverticula. His anticoagulation for afib was stopped during this time (06/2016).  Subsequently, he presented on 12/03/16 with right-sided facial numbness and right arm pain radiating from his shoulder X 2 days. CT scan of the head showed: Region of encephalomalacia in the left occipital lobe and hypoattenuation in the left posterior limb of the internal capsule, likely secondary to remote ischemia in the distribution of the left posterior cerebral artery.  Symptoms of right facial numbness and right arm pain had resolved and he was discharged from the ED. Patient presents for second opinion of possible watchman implantation.  ECG today: afib at 53bpm, LAFB.  Echocardiogram: 06/2015: EF>55%, LA systolic diameter: 3.4cm  Patient reports feeling fatigued in afib. He experiences intermittent palpitations which last for a seconds or less.    1. Persistent atrial fibrillation  > Patient has h/o multiple GI bleeds (most recently in 06/2016 for which his hgn dropped to 5 per discharge summary, developed hypotension and required PRBC transfusion). Anticoagulation therefore has been discontinued.   > He has history of metastatic  diffuse large B-cell lymphoma with continued mets to the bone and liver even after recent chemotherapy.  Unclear if recent symptoms for which he presented to the ER on 11/2016 (right facial numbness and right arm pain) were due to embolic etiology (no clear evidence of embolic stroke or TIA on CT scan - only small vessel disease). Will discuss with patient's oncologist regarding his prognosis from a lymphoma standpoint.  After discussion with patient's oncologist,  I will discuss with my colleagues who perform watchman implantation to see if Taylor Ramos may be a candidate for watchman. I did notify the patient that he would still need to be on aspirin +plavix for some time after his watchman implantation.     UPDATE: Patient's primary oncologist Dr Nelva Nay is on vacation. I subsequently was able to reach her a few weeks later. Dr Merlene Pulling reports that she is obtaining a second opinion regarding patient's therapy options and prognosis could possibly be as poor as 6 months if salvage treatment is not pursued. Therefore, we will wait until patient's chemotherapy options are determined and an estimated prognosis can be given to me before considering Watchman implantation. Dr Merlene Pulling agreed that it would be wise to wait until chemotherapy options and prognosis is determined. Dr Merlene Pulling says she will contact me when these decisions have been made. I notified my colleague, Dr Herbert Deaner, who performs watchmans that we will wait  until we hear back from Dr Merlene Pulling.       Gus Rankin MD MAS  Assistant Professor  Cardiac Electrophysiology        Subjective:       Taylor Ramos is a 76 y.o. male with a history of ESRD s/p transplant in 2006, stage 4 B cell lymphoma with bone metastases (last chemotherapy was in April/May per patient; patient says he was told he still has mets to the bone and liver), HTN, gout, h/o pneumonia, h/o pancreatitis, atrial fibrillation, multiple GI bleeds who is being seen today in new consultation at the request of Dr. Lucienne Minks True MD for evaluation of atrial fibrillation. Taylor Ramos is accompanied by his fiance to clinic. He has had multiple GI bleeding episodes (in 2017, his fiance says his blood vessel in his stomach burst.).  In 06/2016, he was admitted for upper GI bleed and received 2u PRBC. EGD showed nonbleeding duodenal ulcers. Hemoglobin dropped to 5, he had melanotic stools, and he was hypotensive. He was transferred to ICU at that time and tagged RBC scan showed possible small bowel bleed. Colonoscopy showed nonbleeding diverticula. His anticoagulation for afib was stopped during this time (06/2016).  Subsequently, he presented on 12/03/16 with right-sided facial numbness and right arm pain radiating from his shoulder X 2 days. CT scan of the head showed: Region of encephalomalacia in the left occipital lobe and hypoattenuation in the left posterior limb of the internal capsule, likely secondary to remote ischemia in the distribution of the left posterior cerebral artery.  Symptoms of right facial numbness and right arm pain had resolved and he was discharged from the ED. Patient presents for second opinion of possible watchman implantation.  ECG today: afib at 53bpm, LAFB.  Echocardiogram: 06/2015: EF>55%, LA systolic diameter: 3.4cm  Patient reports feeling fatigued in afib. He experiences intermittent palpitations which last for a seconds or less.        Past Medical History:   Diagnosis Date   ??? Arthritis    ??? Chronic kidney disease    ??? Coronary artery disease    ??? Gout    ??? History of transfusion    ??? Hypertension    ??? Kidney transplant status, cadaveric 2006   ??? Peptic ulceration        Past Surgical History:   Procedure Laterality Date   ??? BACK SURGERY     ??? JOINT REPLACEMENT     ??? KNEE SURGERY     ??? NEPHRECTOMY TRANSPLANTED ORGAN     ??? PR COLONOSCOPY FLX DX W/COLLJ SPEC WHEN PFRMD N/A 07/15/2016    Procedure: COLONOSCOPY, FLEXIBLE, PROXIMAL TO SPLENIC FLEXURE; DIAGNOSTIC, W/WO COLLECTION SPECIMEN BY BRUSH OR WASH;  Surgeon: Maris Berger, MD;  [facility];  Service: Gastroenterology   ??? PR COLSC FLX W/RMVL OF TUMOR POLYP LESION SNARE TQ Left 03/17/2013    Procedure: COLONOSCOPY FLEX; W/REMOV TUMOR/LES BY SNARE;  Surgeon: Malcolm Metro, MD;  [facility]: [facility];  Service: Gastroenterology   ??? PR SMALL BOWEL ENDOSCOPY,BIOPSY N/A 07/15/2016    Procedure: SM INTESTINAL ENDO NOT ILEUM; W/BX 1/MX;  Surgeon: Maris Berger, MD;  Location: [facility];  Service: Gastroenterology   ??? PR THORACENTESIS NEEDLE/CATH PLEURA W/IMAGING N/A 07/20/2015    Procedure: THORACENTESIS W/ IMAGING;  Surgeon: Sherwood Gambler, MD;  Location: [facility] PROCEDURE [facility];  Service: Pulmonary   ??? PR UP GI ENDOSCOPY,REMV TUMOR,SNARE N/A 11/23/2015    Procedure:  UGI ENDO; W/REMOV TUMOR/POLYP/OTHER LES-SNARE;  Surgeon: Chriss Driver, MD;  [facility]: [facility];  Service: Gastroenterology   ??? PR UPPER GI ENDOSCOPY,BIOPSY N/A 03/13/2015    Procedure: UGI ENDOSCOPY; WITH BIOPSY, SINGLE OR MULTIPLE;  Surgeon: Collie Siad, MD;  Location: [facility];  Service: Gastroenterology   ??? PR UPPER GI ENDOSCOPY,BIOPSY N/A 12/26/2015    Procedure: UGI ENDOSCOPY; WITH BIOPSY, SINGLE OR MULTIPLE;  Surgeon: Mayford Knife, MD;  [facility]: [facility];  Service: Gastroenterology   ??? PR UPPER GI ENDOSCOPY,DIAGNOSIS N/A 03/17/2013    Procedure: UGI ENDO, INCLUDE ESOPHAGUS, STOMACH, & DUODENUM &/OR JEJUNUM; DX W/WO COLLECTION SPECIMN, BY BRUSH OR WASH;  Surgeon: Malcolm Metro, MD;  [facility]: [facility];  Service: Gastroenterology       Current Outpatient Prescriptions on File Prior to Visit   Medication Sig Dispense Refill   ??? albuterol (PROAIR HFA) 90 mcg/actuation inhaler Inhale 2 puffs every four (4) hours as needed. 1 Inhaler 11   ??? allopurinol (ZYLOPRIM) 100 MG tablet TAKE 1 TABLET (100 MG TOTAL) BY MOUTH DAILY. 90 tablet 3   ??? carboxymethylcellulose sodium (THERATEARS) 0.25 % Drop Administer 2 drops to both eyes 4 (four) times a day as needed (Dry eyes). 30 mL 2   ??? finasteride (PROSCAR) 5 mg tablet TAKE 1 TABLET (5 MG TOTAL) BY MOUTH DAILY. 90 tablet 3   ??? furosemide (LASIX) 20 MG tablet 1-2 tablets daily as needed 60 tablet 11   ??? hydroxypropyl methylcellulose (ISOPTO TEARS) 2.5 % ophthalmic solution 1 drop as needed.     ??? metoprolol tartrate (LOPRESSOR) 25 MG tablet Take 0.5 tablets (12.5 mg total) by mouth Two (2) times a day. 30 tablet 11   ??? multivitamin (MULTIVITAMIN) per tablet Take 1 tablet by mouth daily.      ??? oxybutynin (DITROPAN-XL) 5 MG 24 hr tablet TAKE 1 TABLET (5 MG TOTAL) BY MOUTH ONCE DAILY. 90 tablet 3   ??? predniSONE (DELTASONE) 5 MG tablet Take 1 tablet (5 mg total) by mouth daily. 90 tablet 4   ??? tacrolimus (PROGRAF) 1 MG capsule Take 3 capsules (3mg ) in the morning and 3 capsules (3mg ) at night. Z94.0 180 capsule 11   ??? tamsulosin (FLOMAX) 0.4 mg capsule TAKE 1 CAPSULE (0.4 MG TOTAL) BY MOUTH TWO (2) TIMES A DAY. 180 capsule 3     No current facility-administered medications on file prior to visit.        No Known Allergies    Social History:  Social History     Social History   ??? Marital status: Single     Spouse name: N/A   ??? Number of children: N/A   ??? Years of education: N/A     Social History Main Topics   ??? Smoking status: Former Smoker     Quit date: 03/17/1980   ??? Smokeless tobacco: Never Used   ??? Alcohol use No   ??? Drug use: No   ??? Sexual activity: Not Asked     Other Topics Concern   ??? None     Social History Narrative   ??? None     Denies tobacco, significant alcohol, or drug use    Family History:  Family History   Problem Relation Age of Onset   ??? Cancer Mother    ??? Anesthesia problems Neg Hx Denies family history of afib, sudden cardiac death, pacemaker or ICD implantation.     ROS:  The balance of 10/12 systems are negative aside from that stated in the HPI          Objective:       Physical Exam (reviewed and updated)  Vitals:    12/19/16 1024   BP: 115/40   Pulse: 53   SpO2: 98%     General: frail elderly gentleman sitting hunched over in the chair  Eyes: EOMI, anicteric  ENT: OP clear with MMM  Neck: Supple without LAD, no JVD, no carotid bruits.  Resp: CTA bilaterally with normal WOB.  CV: RRR, no audible murmurs.  Abd: Soft, NT/ND without HSM.  Ext: 2+ radial and DP pulses bilaterally.  No LE edema.  Neuro: A&O, grossly non-focal  Psych: Appropriate affect    Cardiographics  ECG (independently reviewed): afib at 53bpm, LAFB.    Echocardiogram: 06/2015: EF>55%, LA systolic diameter: 3.4cm    Lab Review   Lab Results   Component Value Date    WBC 3.1 (L) 12/03/2016    HGB 8.9 (L) 12/03/2016    HCT 27.8 (L) 12/03/2016    PLT 212 12/03/2016       Lab Results   Component Value Date    NA 135 12/03/2016    K 5.2 (H) 12/03/2016    CL 101 12/03/2016    CO2 25.0 12/03/2016    BUN 22 (H) 12/03/2016    CREATININE 1.14 12/03/2016    GLU 125 12/03/2016    CALCIUM 10.3 (H) 12/03/2016    MG 1.8 11/04/2016    PHOS 2.7 11/04/2016       Lab Results   Component Value Date    BILITOT 0.6 12/03/2016    BILIDIR 0.38 03/07/2016    PROT 5.7 (L) 12/03/2016    ALBUMIN 3.2 (L) 12/03/2016    ALT 35 12/03/2016    AST 32 12/03/2016    ALKPHOS 140 (H) 12/03/2016    GGT 59 11/04/2016       Lab Results   Component Value Date    LABPROT 12.0 09/13/2014    INR 1.39 12/03/2016    APTT 32.8 12/03/2016       None

## 2016-12-25 ENCOUNTER — Telehealth: Payer: Self-pay | Admitting: Cardiovascular Disease

## 2016-12-25 NOTE — Telephone Encounter (Signed)
Spoke w/ pt's fiancee.  She reports that was advised at last ov w/ Dr. Rockey Situ to look into the [facility] Device. He went to [facility] EP and was told that he was not a candidate b/c he's not on a blood thinner.  Reviewed Dr. Precious Bard note w/ pt and fiancee:  "Patient has h/o multiple GI bleeds (most recently in 06/2016 for which his hgn dropped to 5 per discharge summary, developed hypotension and required PRBC transfusion). Anticoagulation therefore has been discontinued.  > He has history of metastatic diffuse large B-cell lymphoma with continued mets to the bone and liver even after recent chemotherapy. Unclear if recent symptoms for which he presented to the ER on 11/2016 (right facial numbness and right arm pain) were due to embolic etiology (no clear evidence of embolic stroke or TIA on CT scan - only small vessel disease). Will discuss with patient's oncologist regarding his prognosis from a lymphoma standpoint. After discussion with patient's oncologist, I will discuss with my colleagues who perform watchman implantation to see if Mr. Laura may be a candidate for watchman. I did notify the patient that he would still need to be on aspirin +plavix for some time after his watchman implantation. "  After some discussion, pt verbalizes understanding that WatchMan device has not been ruled out and that he should wait to hear back from [facility].    At the end of our conversation, pt reports a fullness in his stomach. He denies wt gain, edema, or SOB, but states that he feels constipated, does not remember his last BM. Advised him to speak w/ his PCP regarding this and to call back w/ any cardiac related concerns.  He is appreciative of the call and had not further questions.

## 2016-12-25 NOTE — Telephone Encounter (Signed)
Pt fiance states pt is to have "an operation for his afib". She would like to talk to someone regarding this. States he is not on his blood thinner any more.

## 2016-12-29 ENCOUNTER — Ambulatory Visit
Admission: RE | Admit: 2016-12-29 | Discharge: 2016-12-29 | Disposition: A | Discharge status: Home / Self Care | Payer: MEDICARE

## 2016-12-29 DIAGNOSIS — I482 Chronic atrial fibrillation, unspecified: Principal | ICD-10-CM

## 2017-01-05 ENCOUNTER — Inpatient Hospital Stay: Payer: Medicare HMO

## 2017-01-05 ENCOUNTER — Inpatient Hospital Stay: Payer: Medicare HMO | Attending: Hematology and Oncology | Admitting: Hematology and Oncology

## 2017-01-05 ENCOUNTER — Ambulatory Visit: Payer: Medicare HMO | Admitting: Hematology and Oncology

## 2017-01-05 VITALS — BP 133/55 | HR 50 | Temp 96.3°F | Resp 18 | Wt 126.1 lb

## 2017-01-05 DIAGNOSIS — I272 Pulmonary hypertension, unspecified: Secondary | ICD-10-CM

## 2017-01-05 DIAGNOSIS — Z9221 Personal history of antineoplastic chemotherapy: Secondary | ICD-10-CM | POA: Insufficient documentation

## 2017-01-05 DIAGNOSIS — C786 Secondary malignant neoplasm of retroperitoneum and peritoneum: Secondary | ICD-10-CM | POA: Insufficient documentation

## 2017-01-05 DIAGNOSIS — E785 Hyperlipidemia, unspecified: Secondary | ICD-10-CM

## 2017-01-05 DIAGNOSIS — I129 Hypertensive chronic kidney disease with stage 1 through stage 4 chronic kidney disease, or unspecified chronic kidney disease: Secondary | ICD-10-CM | POA: Diagnosis not present

## 2017-01-05 DIAGNOSIS — R918 Other nonspecific abnormal finding of lung field: Secondary | ICD-10-CM | POA: Diagnosis not present

## 2017-01-05 DIAGNOSIS — K219 Gastro-esophageal reflux disease without esophagitis: Secondary | ICD-10-CM | POA: Diagnosis not present

## 2017-01-05 DIAGNOSIS — Z940 Kidney transplant status: Secondary | ICD-10-CM | POA: Diagnosis not present

## 2017-01-05 DIAGNOSIS — M109 Gout, unspecified: Secondary | ICD-10-CM | POA: Insufficient documentation

## 2017-01-05 DIAGNOSIS — N400 Enlarged prostate without lower urinary tract symptoms: Secondary | ICD-10-CM | POA: Diagnosis not present

## 2017-01-05 DIAGNOSIS — I4891 Unspecified atrial fibrillation: Secondary | ICD-10-CM | POA: Diagnosis not present

## 2017-01-05 DIAGNOSIS — C8330 Diffuse large B-cell lymphoma, unspecified site: Secondary | ICD-10-CM

## 2017-01-05 DIAGNOSIS — C7951 Secondary malignant neoplasm of bone: Secondary | ICD-10-CM | POA: Insufficient documentation

## 2017-01-05 DIAGNOSIS — Z87891 Personal history of nicotine dependence: Secondary | ICD-10-CM | POA: Insufficient documentation

## 2017-01-05 DIAGNOSIS — Z8719 Personal history of other diseases of the digestive system: Secondary | ICD-10-CM | POA: Diagnosis not present

## 2017-01-05 DIAGNOSIS — N186 End stage renal disease: Secondary | ICD-10-CM | POA: Diagnosis not present

## 2017-01-05 DIAGNOSIS — Z452 Encounter for adjustment and management of vascular access device: Secondary | ICD-10-CM

## 2017-01-05 DIAGNOSIS — E041 Nontoxic single thyroid nodule: Secondary | ICD-10-CM | POA: Insufficient documentation

## 2017-01-05 DIAGNOSIS — E210 Primary hyperparathyroidism: Secondary | ICD-10-CM | POA: Insufficient documentation

## 2017-01-05 DIAGNOSIS — Z95828 Presence of other vascular implants and grafts: Secondary | ICD-10-CM

## 2017-01-05 DIAGNOSIS — N529 Male erectile dysfunction, unspecified: Secondary | ICD-10-CM

## 2017-01-05 DIAGNOSIS — C787 Secondary malignant neoplasm of liver and intrahepatic bile duct: Secondary | ICD-10-CM

## 2017-01-05 MED ORDER — SODIUM CHLORIDE 0.9% FLUSH
10.0000 mL | INTRAVENOUS | Status: DC | PRN
  Administered 2017-01-05: 10 mL via INTRAVENOUS
  Filled 2017-01-05: qty 10

## 2017-01-05 MED ORDER — HEPARIN SOD (PORK) LOCK FLUSH 100 UNIT/ML IV SOLN
500.0000 [IU] | Freq: Once | INTRAVENOUS | Status: AC
  Administered 2017-01-05: 500 [IU] via INTRAVENOUS

## 2017-01-05 NOTE — Progress Notes (Signed)
[facility] day:  01/05/2017  Chief Complaint: Matthew Brown is a 76 y.o. male with post-transplant lymphoproliferative disorder, stage IVBE diffuse large B cell lymphoma, who is seen for review of PET scan after 6 cycles of mini-RCHOP.  HPI:  The patient was last seen in the medical oncology clinic on 10/14/2016.  At that time, he was doing well.  He denied any B symptoms.  He was eating well.  PET scan revealed early progression. Discuss treatment options salvage chemotherapy by consideration of autologous stem cell transplant. He was to have followed up with the renal transplant team and medical oncology at [facility].  He was lost to follow-up.  He did not go to [facility].  He was seen by cardiology for his atrial fibrillation.    He underwent monitoring.  He was felt not to be a surgical candidate.  Symptomatically, he denies any B symptoms.  He denies any adenopath, bruising or bleeding.  He has had no issues with infections.  He is eating well but not gaining weight.   Past Medical History:  Diagnosis Date  . Benign prostatic hypertrophy   . Chronic headache 10/19/2015  . ED (erectile dysfunction)   . End stage renal disease ([place])   . Essential hypertension   . GERD (gastroesophageal reflux disease)   . GIB (gastrointestinal bleeding)    a. 11/4401 s/p R colic artery embolization;  b. 02/2015 EGD: duod ulcerative mass->Bx notable for coagulative necrosis - ? ischemia vs thrombosis-->coumadin d/c'd.  . Gout   . Hearing loss   . Hemorrhoids   . Hyperlipidemia   . Lymphoma ([place])   . Lymphoma ([place]) 2017  . Multiple thyroid nodules 06/06/2016   Noted on carotid US; dedicated US to be ordered by staff  . Osteoarthrosis, unspecified whether generalized or localized, lower leg   . Persistent atrial fibrillation ([place])    a. CHA2DS2VASc = 3-->coumadin d/c'd 02/2015 2/2 recurrent GIB.  [name] Prostatitis   . Pulmonary hypertension ([place])    a. 10/2014 Echo: EF  60-65%, mild to mod MR, mildly dil LA, nl RV, PASP 37mmHg.  [name] Renal transplant recipient   . Ulcers of both great toes [facility])     Past Surgical History:  Procedure Laterality Date  . BACK SURGERY    . ESOPHAGOGASTRODUODENOSCOPY  03/13/15   severe esophagitis, ulcerated mass  . ESOPHAGOGASTRODUODENOSCOPY (EGD) WITH PROPOFOL N/A 07/09/2016   Procedure: ESOPHAGOGASTRODUODENOSCOPY (EGD) WITH PROPOFOL;  Surgeon: [name], MD;  Location: [facility] ENDOSCOPY;  Service: Endoscopy;  Laterality: N/A;  . HERNIA REPAIR  1974  . PERIPHERAL VASCULAR CATHETERIZATION N/A 05/07/2016   Procedure: Glori Luis Cath Insertion;  Surgeon: Algernon Huxley, MD;  Location: [facility];  Service: Cardiovascular;  Laterality: N/A;  . PROSTATE ABLATION    . STOMACH SURGERY     blood vessel burst  . THROAT SURGERY    . TOTAL KNEE ARTHROPLASTY      Family History  Problem Relation Age of Onset  . Cancer Mother        throat  . Diabetes Brother   . Heart disease Brother   . Stroke Brother   . Hypertension Brother   . Diabetes Sister   . Heart disease Sister   . Hypertension Sister   . Diabetes Sister   . Diabetes Brother   . COPD Neg Hx   . Kidney disease Neg Hx   . Prostate cancer Neg Hx   . Kidney cancer  Neg Hx     Social History:  reports that he quit smoking about 38 years ago. His smoking use included Cigarettes. He has a 25.00 pack-year smoking history. He has never used smokeless tobacco. He reports that he does not drink alcohol or use drugs.  He stopped smoking in 1981.  He smoked 3 cigarettes/day.  He lives in [place].  The patient is accompanied by his wife, [name],  today.  Allergies: No Known Allergies  Current Medications: Current Outpatient Prescriptions  Medication Sig Dispense Refill  . acetaminophen (TYLENOL) 325 MG tablet Take 2 tablets (650 mg total) by mouth every 6 (six) hours as needed for mild pain (or Fever >/= 101).    [name] albuterol (PROAIR HFA) 108 (90 BASE) MCG/ACT inhaler Inhale  1-2 puffs into the lungs every 4 (four) hours as needed.     [name] allopurinol (ZYLOPRIM) 100 MG tablet Take 100 mg by mouth daily.      [name] COLCRYS 0.6 MG tablet Take 1 tablet by mouth 2 (two) times daily as needed.    . diphenhydrAMINE (BENADRYL) 25 mg capsule Take 1 capsule (25 mg total) by mouth at bedtime as needed for sleep. 30 capsule 0  . feeding supplement, ENSURE ENLIVE, (ENSURE ENLIVE) LIQD Take 237 mLs by mouth 3 (three) times daily between meals. 90 Bottle 0  . finasteride (PROSCAR) 5 MG tablet Take 1 tablet (5 mg total) by mouth daily. 90 tablet 3  . furosemide (LASIX) 20 MG tablet Take 20 mg by mouth every other day. Take 1-2 tablets daily.    . hydroxypropyl methylcellulose (ISOPTO TEARS) 2.5 % ophthalmic solution Place 1 drop into both eyes as needed.     . metoprolol tartrate (LOPRESSOR) 25 MG tablet Take 12.5 mg by mouth 2 (two) times daily.     . Multiple Vitamin (MULTIVITAMIN) tablet Take 1 tablet by mouth daily.      [name] oxybutynin (DITROPAN-XL) 5 MG 24 hr tablet Take 1 tablet (5 mg total) by mouth daily. 90 tablet 3  . pantoprazole (PROTONIX) 40 MG tablet TAKE 1 TABLET (40 MG TOTAL) BY MOUTH 2 (TWO) TIMES DAILY. 60 tablet 1  . predniSONE (DELTASONE) 5 MG tablet Take 5 mg by mouth daily.     . simethicone (MYLICON) 80 MG chewable tablet Chew 1 tablet (80 mg total) by mouth every 6 (six) hours as needed for flatulence. 120 tablet 0  . tacrolimus (PROGRAF) 1 MG capsule Take 3 mg by mouth 2 (two) times daily. Reported on 08/16/2015    . tamsulosin (FLOMAX) 0.4 MG CAPS capsule Take 1 capsule (0.4 mg total) by mouth daily. 90 capsule 3  . traZODone (DESYREL) 50 MG tablet 1-2 TABS AS NEEDED FOR SLEEP  3  . ondansetron (ZOFRAN) 4 MG tablet Take 1 tablet (4 mg total) by mouth every 6 (six) hours as needed for nausea. (Patient not taking: Reported on 11/24/2016) 20 tablet 0  . oxyCODONE-acetaminophen (ROXICET) 5-325 MG tablet Take 1 tablet by mouth every 6 (six) hours as needed for severe pain.  (Patient not taking: Reported on 11/24/2016) 20 tablet 0  . predniSONE (DELTASONE) 20 MG tablet as directed.     . sucralfate (CARAFATE) 1 g tablet Take 1 tablet (1 g total) by mouth 4 (four) times daily. Resume taking after one week- once finished taking oral levaquine. ( to avoid interaction.) (Patient not taking: Reported on 01/05/2017) 40 tablet 3   No current facility-administered medications for this visit.    Facility-Administered Medications Ordered  in Other Visits  Medication Dose Route Frequency Provider Last Rate Last Dose  . methotrexate (PF) 12 mg in sodium chloride 0.9 % INTRATHECAL chemo injection   Intrathecal Once Greysen Devino C, MD      . methotrexate (PF) 12 mg in sodium chloride 0.9 % INTRATHECAL chemo injection   Intrathecal Once Lequita Asal, MD        Review of Systems:  GENERAL:  Feels "good".  No fevers or sweats.  Weight stable. PERFORMANCE STATUS (ECOG):  1 HEENT:  No visual changes, sore throat, mouth sores or tenderness. Lungs: No shortness of breath or cough.  No hemoptysis. Cardiac:  No chest pain, palpitations, orthopnea, or PND.  Atrial fibrillation s/p cardiac evaluation. GI: Eating well.  No nausea, vomiting, diarrhea, constipation, melena or hematochezia. GU:  Enlarged prostate.  No urgency, frequency, dysuria, or hematuria. Musculoskeletal:  No back pain.  No joint pain.  No muscle tenderness. Extremities:  No pain or swelling. Skin:  Nail changes.  No rashes or skin changes. Neuro:  No headache, numbness or weakness, balance or coordination issues. Endocrine:  No diabetes, thyroid issues, hot flashes or night sweats. Psych:  No mood changes, depression or anxiety. Pain:  No pain. Review of systems:  All other systems reviewed and found to be negative.  Physical Exam: Blood pressure (!) 133/55, pulse (!) 50, temperature (!) 96.3 F (35.7 C), temperature source Tympanic, resp. rate 18, weight 126 lb 1 oz (57.2 kg). GENERAL:  Thin elderly  gentleman sitting comfortably in the exam room in no acute distress.  MENTAL STATUS:  Alert and oriented to person, place and time. HEAD:  Lu Duffel.  Temporal wasting.  Normocephalic, atraumatic, face symmetric, no Cushingoid features. EYES:  Glasses.  Brown eyes.  Pupils equal round and reactive to light and accomodation.  No conjunctivitis or scleral icterus. ENT:  Oropharynx clear without lesion.  Edentulous.  Tongue normal. Mucous membranes moist.  RESPIRATORY:  Clear to auscultation without rales, wheezes or rhonchi. CARDIOVASCULAR:  Regular rate and rhythm without murmur, rub or gallop. ABDOMEN:  Soft, non-tender, with active bowel sounds, and no hepatosplenomegaly.  No masses.   SKIN:  No rashes, ulcers or lesions. EXTREMITIES:  No edema, no skin discoloration or tenderness.  Right upper extremity with dilated vascular s/p AV fistula (chronic).  No palpable cords. NEUROLOGICAL: Unremarkable. PSYCH:  Appropriate.   Imaging studies:  04/28/2016 PET scan:  Bulky intensely hypermetabolic periaortic upper abdominal and mesenteric adenopathy concerning for high-grade lymphoma.  There was hypermetabolic liver metastasis.  There was hpermetabolic lesion involving the small bowel of the upper pelvis.  There were multiple sites of hypermetabolic skeletal metastasis. There was moderate volume right pneumothorax. 08/12/2016 PET scan:  Interval response to therapy. There has been significant decrease in extent of hypermetabolic tumor within the neck, chest, abdomen and pelvis as well as the axial and appendicular skeleton.  There was residual enlarged and hypermetabolic small bowel mesenteric and periaortic lymph nodes. There was a persistent hypermetabolic focus of increased uptake within the spleen.  There was resolution of previous multifocal hypermetabolic lesions within the liver. 10/10/2016 PET scan:  Slight worsening compared to the prior study. Several lesions were slightly more hypermetabolic  and there appeared to be a newly enlarged and hypermetabolic mesenteric node adjacent to one of the previous mesenteric lymph nodes. However, there are no new hypermetabolic lesions in the neck, chest, or skeleton.  The small hypermetabolic peripheral lesion of the right kidney upper pole is slightly less  hypermetabolic.   No visits with results within 3 Day(s) from this visit.  Latest known visit with results is:  Office Visit on 11/24/2016  Component Date Value Ref Range Status  . Specific Gravity, UA 11/24/2016 1.015  1.005 - 1.030 Final  . pH, UA 11/24/2016 5.5  5.0 - 7.5 Final  . Color, UA 11/24/2016 Yellow  Yellow Final  . Appearance Ur 11/24/2016 Clear  Clear Final  . Leukocytes, UA 11/24/2016 Negative  Negative Final  . Protein, UA 11/24/2016 2+* Negative/Trace Final  . Glucose, UA 11/24/2016 Negative  Negative Final  . Ketones, UA 11/24/2016 Negative  Negative Final  . RBC, UA 11/24/2016 Trace* Negative Final  . Bilirubin, UA 11/24/2016 Negative  Negative Final  . Urobilinogen, Ur 11/24/2016 1.0  0.2 - 1.0 mg/dL Final  . Nitrite, UA 11/24/2016 Negative  Negative Final  . Microscopic Examination 11/24/2016 See below:   Final  . Scan Result 11/24/2016 0   Final  . WBC, UA 11/24/2016 0-5  0 - 5 /hpf Final  . RBC, UA 11/24/2016 None seen  0 - 2 /hpf Final  . Epithelial Cells (non renal) 11/24/2016 None seen  0 - 10 /hpf Final  . Bacteria, UA 11/24/2016 None seen  None seen/Few Final    Assessment:  Matthew Brown is a 76 y.o. male s/p renal transplant (2007) with a post-transplant lymphoproliferative disorder, stage IV diffuse large B cell lymphoma.  He presented with a 2-3 month history of progressive back pain superimposed on chronic back pain.    PET scan on 04/28/2016 revealed bulky intensely hypermetabolic periaortic upper abdominal and mesenteric adenopathy concerning for high-grade lymphoma.  There was hypermetabolic liver metastasis.  There was hpermetabolic lesion involving  the small bowel of the upper pelvis.  There were multiple sites of hypermetabolic skeletal metastasis. There was moderate volume right pneumothorax.  CT guided retroperitoneal node biopsy on 04/30/2016 revealed diffuse large B cell lymphoma.  Hepatitis B and C testing on 05/06/2016 were negative.  Echo on 05/06/2016 revealed an EF of 55-60%.  Echo on 09/01/2016 revealed an EF of 60-65%.  Bone marrow aspirate and biopsy on 05/08/2016 revealed multifocal marrow involvement by diffuse large B-cell lymphoma. There was variably cellular marrow for age (50% - 90%) with a patchy predominantly nodular large B-cell infiltrate, overall estimated to account for 20% of the core biopsy. There was adequate residual trilineage hematopoiesis with mild nonspecific dyserythropoiesis. There was patchy mild increase in reticulin. Storage iron was present. The immunohistochemical staining pattern of the B-cell infiltrate (CD10 +/-, BCL 6+, BCL-2 +) suggested possible large cell transformation of follicular lymphoma.  Flow cytometry revealed no significant immunophenotypic abnormalities or evidence of B-cell lymphoma.  He has bone metastasis and hypercalcemia.  Calcium was 11.2 (ionized 7.3) on 05/13/2016.  He received Zometa on 05/13/2016.  He began [place] on 06/09/2016 (last 08/05/2016).  Work-up on 04/15/2016 revealed the following normal studies: ferritin (343), iron saturation (6%), TIBC (241; low), B12 (525), folate (27).  Reticulocyte count was 2%.  LDH was 409.  Uric acid was 7.7 (4.4 - 7.6).  He was admitted at [facility] from 04/28/2016 - 04/30/2016 with a moderate volume right sided pneumothorax.  His pneumothorax improved spontaneously. Plain films of the right femur revealed the lucent bone lesion in the right femoral neck  was poorly characterized.  There was no evidence for an acute fracture.  PTH was 106 (high) 04/30/2016 with a calcium of 11.2.  Etiology was c/w primary hyperparathyroidism.  PTH-related polypeptide  was < 1.1 on 04/30/2016.  He has a history of GI bleeding in 08/2014.  He underwent tagged RBC scan which revealed an active bleed in the hepatic flexure.  He was embolized in vascular interventional radiology.  He was admitted to [facility] from 07/08/2016 - 07/13/2016 with GI bleeding.  EGD on 07/09/2016 revealed multiple non-bleeding duodenal ulcer as well as duodenitis. Tagged RBC scan on 07/11/2016 revealed active GI bleed in the lateral right mid abdomen coursing through multiple curvilinear bowel loops in the right mid abdomen favoring a small bowel source of bleeding.  He received 5 units of PRBCs, 2 units of pheresed platelets, and vitamin K from 07/07/2016 0 07/13/2016.  He was transferred to [facility] from 07/13/2016 - 07/16/2016.  [facility] performed a colonoscopy and push enteroscopy which showed several diverticula and old blood but no source of bleeding. Source of bleed was presumed to be diverticular, although it was possible a small bowel site was not visualized on endoscopy.  He has a history of renal failure s/p renal transplant.  He is tacrolimus (Prograf), and steroids  Creatinine has ranged between 1.43 - 1.93 in the past 6 months. Mycophenolate (MMF) was discontinued at diagnosis.  Prograf level was 5.5 (3.0-8.0) on 05/08/2016 and 2.2 on 08/13/2016.   He received 6 cycles of mini-RCHOP (05/09/2016 - 09/05/2016).  Cycle #1 was complicated by fever and neutropenia.  All cultures were negative.  He was treated empirically with Cefepime.  Peripheral smear revealed schistocytes.  Cycle #3 was complicated by a GI bleed.  PET scan on 10/10/2016 revealed slight worsening compared to the prior study. Several lesions were slightly more hypermetabolic and there appeared to be a newly enlarged and hypermetabolic mesenteric node adjacent to one of the previous mesenteric lymph nodes. However, there are no new hypermetabolic lesions in the neck, chest, or skeleton.  The small hypermetabolic peripheral lesion  of the right kidney upper pole is slightly less hypermetabolic.  He was lost to follow-up.  CSF on 06/19/2016 revealed 7 WBCs (4% segs, 79% lymphs, and 17% monocytes).  He has undergone LP with IT MTX x 4 (07/24/2016, 08/14/2016, 09/04/2016, and 10/02/2016).  Cytology was negative on 07/24/2016, 09/04/2016, and 10/02/2016.  Symptomatically, he denies any B symptoms.  He is eating well, but not gaining weight.  Exam reveals no adenopathy or hepatosplenomegaly.  Plan: 1.  Discuss concern for progressive disease given PET scan on 10/10/2016 and no intervening treatment.  Discuss plan for repeat PET scan.  Re-review plan for salvage chemotherapy.  Unclear if patient is a candidate for autologous stem cell transplant.  Discuss coordination of care with [facility]. 2.  Schedule PET scan. 3.  Port-cath flush today. 4.  Phone follow-up with Dr. Herma Ard [facility] nephrology). 5.  RTC next week for MD assess and review of imaging.   Lequita Asal, MD  01/05/2017, 10:19 AM

## 2017-01-05 NOTE — Progress Notes (Signed)
Patient is currently wearing heart monitor to monitor his a-fib.  Patient has appointment with [facility] on August 3.  Patient offers no complaints today.

## 2017-01-06 ENCOUNTER — Ambulatory Visit: Payer: Medicare HMO | Admitting: Hematology and Oncology

## 2017-01-06 NOTE — Unmapped (Addendum)
.    Specialty Pharmacy Refill Coordination Note     Taylor Ramos is a 76 y.o. male contacted today regarding refills of his specialty medication(s).    Reviewed and verified with patient:      Specialty medication(s) and dose(s) confirmed: yes  Changes to medications: no  Changes to insurance: no    Medication Adherence    Patient reported X missed doses in the last month:  0  Medication Assistance Program  Refill Coordination  Has the Patient's Contact Information Changed:  No  Is the Shipping Address Different:  No  Shipping Information  Delivery Scheduled:  Yes  Delivery Date: 01/29/17  Medications to be Shipped:  PROGRAF: SENT 180, 24 REMAINS          Follow-up: 3 week(s)     Mitzi Davenport  Specialty Pharmacy Technician

## 2017-01-06 NOTE — Unmapped (Signed)
Reset call attempts

## 2017-01-07 ENCOUNTER — Other Ambulatory Visit: Payer: Self-pay | Admitting: Hematology and Oncology

## 2017-01-07 MED ORDER — PANTOPRAZOLE 40 MG TABLET,DELAYED RELEASE
ORAL_TABLET | 1 refills | 0 days | Status: CP
Start: 2017-01-07 — End: ?

## 2017-01-09 ENCOUNTER — Ambulatory Visit: Payer: Medicare HMO

## 2017-01-13 ENCOUNTER — Ambulatory Visit: Payer: Medicare HMO | Admitting: Hematology and Oncology

## 2017-01-14 ENCOUNTER — Ambulatory Visit
Admission: RE | Admit: 2017-01-14 | Discharge: 2017-01-14 | Disposition: A | Discharge status: Home / Self Care | Payer: Medicare HMO | Source: Ambulatory Visit | Attending: Hematology and Oncology | Admitting: Hematology and Oncology

## 2017-01-14 DIAGNOSIS — R918 Other nonspecific abnormal finding of lung field: Secondary | ICD-10-CM | POA: Insufficient documentation

## 2017-01-14 DIAGNOSIS — C7951 Secondary malignant neoplasm of bone: Secondary | ICD-10-CM

## 2017-01-14 DIAGNOSIS — C787 Secondary malignant neoplasm of liver and intrahepatic bile duct: Secondary | ICD-10-CM | POA: Diagnosis present

## 2017-01-14 DIAGNOSIS — I700 Atherosclerosis of aorta: Secondary | ICD-10-CM | POA: Insufficient documentation

## 2017-01-14 DIAGNOSIS — N289 Disorder of kidney and ureter, unspecified: Secondary | ICD-10-CM | POA: Diagnosis not present

## 2017-01-14 DIAGNOSIS — C8330 Diffuse large B-cell lymphoma, unspecified site: Secondary | ICD-10-CM

## 2017-01-14 LAB — GLUCOSE, CAPILLARY: Glucose-Capillary: 87 mg/dL (ref 65–99)

## 2017-01-14 MED ORDER — FLUDEOXYGLUCOSE F - 18 (FDG) INJECTION
12.0000 | Freq: Once | INTRAVENOUS | Status: AC | PRN
  Administered 2017-01-14: 12.78 via INTRAVENOUS

## 2017-01-14 NOTE — Unmapped (Signed)
I was notified to call [employer] about the patient's zio patch monitor. He was in AF 100% of the time, although he had 33 episodes of NSVT with max HR 250 bpm and longest run of 24 beats.

## 2017-01-16 ENCOUNTER — Inpatient Hospital Stay (HOSPITAL_BASED_OUTPATIENT_CLINIC_OR_DEPARTMENT_OTHER): Payer: Medicare HMO | Admitting: Hematology and Oncology

## 2017-01-16 ENCOUNTER — Encounter: Payer: Self-pay | Admitting: Hematology and Oncology

## 2017-01-16 ENCOUNTER — Inpatient Hospital Stay: Payer: Medicare HMO

## 2017-01-16 VITALS — BP 129/52 | HR 47 | Temp 96.3°F | Resp 18 | Wt 126.4 lb

## 2017-01-16 DIAGNOSIS — C8333 Diffuse large B-cell lymphoma, intra-abdominal lymph nodes: Secondary | ICD-10-CM

## 2017-01-16 DIAGNOSIS — I129 Hypertensive chronic kidney disease with stage 1 through stage 4 chronic kidney disease, or unspecified chronic kidney disease: Secondary | ICD-10-CM

## 2017-01-16 DIAGNOSIS — E041 Nontoxic single thyroid nodule: Secondary | ICD-10-CM

## 2017-01-16 DIAGNOSIS — Z452 Encounter for adjustment and management of vascular access device: Secondary | ICD-10-CM

## 2017-01-16 DIAGNOSIS — Z8719 Personal history of other diseases of the digestive system: Secondary | ICD-10-CM

## 2017-01-16 DIAGNOSIS — N529 Male erectile dysfunction, unspecified: Secondary | ICD-10-CM | POA: Diagnosis not present

## 2017-01-16 DIAGNOSIS — R918 Other nonspecific abnormal finding of lung field: Secondary | ICD-10-CM

## 2017-01-16 DIAGNOSIS — E210 Primary hyperparathyroidism: Secondary | ICD-10-CM

## 2017-01-16 DIAGNOSIS — N186 End stage renal disease: Secondary | ICD-10-CM | POA: Diagnosis not present

## 2017-01-16 DIAGNOSIS — N400 Enlarged prostate without lower urinary tract symptoms: Secondary | ICD-10-CM | POA: Diagnosis not present

## 2017-01-16 DIAGNOSIS — I272 Pulmonary hypertension, unspecified: Secondary | ICD-10-CM

## 2017-01-16 DIAGNOSIS — C786 Secondary malignant neoplasm of retroperitoneum and peritoneum: Secondary | ICD-10-CM | POA: Diagnosis not present

## 2017-01-16 DIAGNOSIS — E785 Hyperlipidemia, unspecified: Secondary | ICD-10-CM

## 2017-01-16 DIAGNOSIS — Z9221 Personal history of antineoplastic chemotherapy: Secondary | ICD-10-CM

## 2017-01-16 DIAGNOSIS — I4891 Unspecified atrial fibrillation: Secondary | ICD-10-CM

## 2017-01-16 DIAGNOSIS — C787 Secondary malignant neoplasm of liver and intrahepatic bile duct: Secondary | ICD-10-CM

## 2017-01-16 DIAGNOSIS — K219 Gastro-esophageal reflux disease without esophagitis: Secondary | ICD-10-CM

## 2017-01-16 DIAGNOSIS — C8330 Diffuse large B-cell lymphoma, unspecified site: Secondary | ICD-10-CM

## 2017-01-16 DIAGNOSIS — Z87891 Personal history of nicotine dependence: Secondary | ICD-10-CM

## 2017-01-16 DIAGNOSIS — Z940 Kidney transplant status: Secondary | ICD-10-CM

## 2017-01-16 DIAGNOSIS — C7951 Secondary malignant neoplasm of bone: Secondary | ICD-10-CM

## 2017-01-16 DIAGNOSIS — M109 Gout, unspecified: Secondary | ICD-10-CM

## 2017-01-16 LAB — CBC WITH DIFFERENTIAL/PLATELET
Basophils Absolute: 0 10*3/uL (ref 0–0.1)
Basophils Relative: 1 %
Eosinophils Absolute: 0.1 10*3/uL (ref 0–0.7)
Eosinophils Relative: 2 %
HCT: 28.6 % — ABNORMAL LOW (ref 40.0–52.0)
Hemoglobin: 9.6 g/dL — ABNORMAL LOW (ref 13.0–18.0)
Lymphocytes Relative: 14 %
Lymphs Abs: 0.4 10*3/uL — ABNORMAL LOW (ref 1.0–3.6)
MCH: 27.4 pg (ref 26.0–34.0)
MCHC: 33.6 g/dL (ref 32.0–36.0)
MCV: 81.3 fL (ref 80.0–100.0)
Monocytes Absolute: 0.4 10*3/uL (ref 0.2–1.0)
Monocytes Relative: 13 %
Neutro Abs: 1.9 10*3/uL (ref 1.4–6.5)
Neutrophils Relative %: 70 %
Platelets: 107 10*3/uL — ABNORMAL LOW (ref 150–440)
RBC: 3.51 MIL/uL — ABNORMAL LOW (ref 4.40–5.90)
RDW: 22.2 % — ABNORMAL HIGH (ref 11.5–14.5)
Smear Review: DECREASED
WBC: 2.8 10*3/uL — ABNORMAL LOW (ref 3.8–10.6)

## 2017-01-16 LAB — COMPREHENSIVE METABOLIC PANEL
ALT: 12 U/L — ABNORMAL LOW (ref 17–63)
AST: 32 U/L (ref 15–41)
Albumin: 3.8 g/dL (ref 3.5–5.0)
Alkaline Phosphatase: 86 U/L (ref 38–126)
Anion gap: 5 (ref 5–15)
BUN: 22 mg/dL — ABNORMAL HIGH (ref 6–20)
CO2: 28 mmol/L (ref 22–32)
Calcium: 10.5 mg/dL — ABNORMAL HIGH (ref 8.9–10.3)
Chloride: 102 mmol/L (ref 101–111)
Creatinine, Ser: 1.38 mg/dL — ABNORMAL HIGH (ref 0.61–1.24)
GFR calc Af Amer: 56 mL/min — ABNORMAL LOW (ref >60)
GFR calc non Af Amer: 48 mL/min — ABNORMAL LOW (ref >60)
Glucose, Bld: 107 mg/dL — ABNORMAL HIGH (ref 65–99)
Potassium: 4.7 mmol/L (ref 3.5–5.1)
Sodium: 135 mmol/L (ref 135–145)
Total Bilirubin: 0.6 mg/dL (ref 0.3–1.2)
Total Protein: 6.3 g/dL — ABNORMAL LOW (ref 6.5–8.1)

## 2017-01-16 LAB — LACTATE DEHYDROGENASE: LDH: 403 U/L — ABNORMAL HIGH (ref 98–192)

## 2017-01-16 LAB — URIC ACID: Uric Acid, Serum: 6.1 mg/dL (ref 4.4–7.6)

## 2017-01-16 NOTE — Progress Notes (Signed)
Patient states he is having pain in his right ribcage and right back.  He took 1/2 Oxycodone this morning and it is better.  Also states he has a lot of mucus and has to clear his throat when trying to eat.  Eating well.  Patient here today for PET results.

## 2017-01-16 NOTE — Progress Notes (Signed)
[facility] day:  01/16/2017  Chief Complaint: Matthew Brown is a 76 y.o. male with post-transplant lymphoproliferative disorder, stage IVBE diffuse large B cell lymphoma, who is seen for review of interval PET scan and discussion regarding direction of therapy.  HPI:  The patient was last seen in the medical oncology clinic on 01/05/2017.  At that time, he denied any B symptoms.  He was eating well.  Weight was stable.  Memory was poor.  PET scan on 01/14/2017 revealed marked progression of disease as evidenced by progressive bulky mesenteric adenopathy, new low left internal jugular/left juxta diaphragmatic/abdominal retroperitoneal adenopathy, new pulmonary nodules, new hepatic and splenic lesions and new peritoneal nodules, all of which are hypermetabolic.  Symptomatically, he notes pain in his right rib area.  He denies any trauma. He also notes that his right ear is bothering him. Weight is stable despite eating a lot. He denies any fevers, sweats ,adenopathy, bruising or bleeding.  He denies any infections.   Past Medical History:  Diagnosis Date  . Benign prostatic hypertrophy   . Chronic headache 10/19/2015  . ED (erectile dysfunction)   . End stage renal disease ([place])   . Essential hypertension   . GERD (gastroesophageal reflux disease)   . GIB (gastrointestinal bleeding)    a. 06/6965 s/p R colic artery embolization;  b. 02/2015 EGD: duod ulcerative mass->Bx notable for coagulative necrosis - ? ischemia vs thrombosis-->coumadin d/c'd.  . Gout   . Hearing loss   . Hemorrhoids   . Hyperlipidemia   . Lymphoma ([place])   . Lymphoma ([place]) 2017  . Multiple thyroid nodules 06/06/2016   Noted on carotid US; dedicated US to be ordered by staff  . Osteoarthrosis, unspecified whether generalized or localized, lower leg   . Persistent atrial fibrillation ([place])    a. CHA2DS2VASc = 3-->coumadin d/c'd 02/2015 2/2 recurrent GIB.  [name] Prostatitis   .  Pulmonary hypertension ([name])    a. 10/2014 Echo: EF 60-65%, mild to mod MR, mildly dil LA, nl RV, PASP 46mmHg.  [name] Renal transplant recipient   . Ulcers of both great toes [facility])     Past Surgical History:  Procedure Laterality Date  . BACK SURGERY    . ESOPHAGOGASTRODUODENOSCOPY  03/13/15   severe esophagitis, ulcerated mass  . ESOPHAGOGASTRODUODENOSCOPY (EGD) WITH PROPOFOL N/A 07/09/2016   Procedure: ESOPHAGOGASTRODUODENOSCOPY (EGD) WITH PROPOFOL;  Surgeon: [name], MD;  Location: [facility] ENDOSCOPY;  Service: Endoscopy;  Laterality: N/A;  . HERNIA REPAIR  1974  . PERIPHERAL VASCULAR CATHETERIZATION N/A 05/07/2016   Procedure: Glori Luis Cath Insertion;  Surgeon: Algernon Huxley, MD;  Location: [place] [facility];  Service: Cardiovascular;  Laterality: N/A;  . PROSTATE ABLATION    . STOMACH SURGERY     blood vessel burst  . THROAT SURGERY    . TOTAL KNEE ARTHROPLASTY      Family History  Problem Relation Age of Onset  . Cancer Mother        throat  . Diabetes Brother   . Heart disease Brother   . Stroke Brother   . Hypertension Brother   . Diabetes Sister   . Heart disease Sister   . Hypertension Sister   . Diabetes Sister   . Diabetes Brother   . COPD Neg Hx   . Kidney disease Neg Hx   . Prostate cancer Neg Hx   . Kidney cancer Neg Hx     Social History:  reports that  he quit smoking about 38 years ago. His smoking use included Cigarettes. He has a 25.00 pack-year smoking history. He has never used smokeless tobacco. He reports that he does not drink alcohol or use drugs.  He stopped smoking in 1981.  He smoked 3 cigarettes/day.  He lives in [place].  The patient is accompanied by his wife, [name],  today.  Allergies: No Known Allergies  Current Medications: Current Outpatient Prescriptions  Medication Sig Dispense Refill  . acetaminophen (TYLENOL) 325 MG tablet Take 2 tablets (650 mg total) by mouth every 6 (six) hours as needed for mild pain (or Fever >/= 101).    [name]  albuterol (PROAIR HFA) 108 (90 BASE) MCG/ACT inhaler Inhale 1-2 puffs into the lungs every 4 (four) hours as needed.     [name] allopurinol (ZYLOPRIM) 100 MG tablet Take 100 mg by mouth daily.      [name] COLCRYS 0.6 MG tablet Take 1 tablet by mouth 2 (two) times daily as needed.    . diphenhydrAMINE (BENADRYL) 25 mg capsule Take 1 capsule (25 mg total) by mouth at bedtime as needed for sleep. 30 capsule 0  . feeding supplement, ENSURE ENLIVE, (ENSURE ENLIVE) LIQD Take 237 mLs by mouth 3 (three) times daily between meals. 90 Bottle 0  . finasteride (PROSCAR) 5 MG tablet Take 1 tablet (5 mg total) by mouth daily. 90 tablet 3  . furosemide (LASIX) 20 MG tablet Take 20 mg by mouth every other day. Take 1-2 tablets daily.    . hydroxypropyl methylcellulose (ISOPTO TEARS) 2.5 % ophthalmic solution Place 1 drop into both eyes as needed.     . metoprolol tartrate (LOPRESSOR) 25 MG tablet Take 12.5 mg by mouth 2 (two) times daily.     . Multiple Vitamin (MULTIVITAMIN) tablet Take 1 tablet by mouth daily.      [name] oxybutynin (DITROPAN-XL) 5 MG 24 hr tablet Take 1 tablet (5 mg total) by mouth daily. 90 tablet 3  . pantoprazole (PROTONIX) 40 MG tablet TAKE 1 TABLET BY MOUTH TWICE A DAY 60 tablet 1  . predniSONE (DELTASONE) 5 MG tablet Take 5 mg by mouth daily.     . simethicone (MYLICON) 80 MG chewable tablet Chew 1 tablet (80 mg total) by mouth every 6 (six) hours as needed for flatulence. 120 tablet 0  . sucralfate (CARAFATE) 1 g tablet Take 1 tablet (1 g total) by mouth 4 (four) times daily. Resume taking after one week- once finished taking oral levaquine. ( to avoid interaction.) 40 tablet 3  . tacrolimus (PROGRAF) 1 MG capsule Take 3 mg by mouth 2 (two) times daily. Reported on 08/16/2015    . tamsulosin (FLOMAX) 0.4 MG CAPS capsule Take 1 capsule (0.4 mg total) by mouth daily. 90 capsule 3  . traZODone (DESYREL) 50 MG tablet 1-2 TABS AS NEEDED FOR SLEEP  3  . ondansetron (ZOFRAN) 4 MG tablet Take 1 tablet (4 mg  total) by mouth every 6 (six) hours as needed for nausea. (Patient not taking: Reported on 01/16/2017) 20 tablet 0  . oxyCODONE-acetaminophen (ROXICET) 5-325 MG tablet Take 1 tablet by mouth every 6 (six) hours as needed for severe pain. (Patient not taking: Reported on 11/24/2016) 20 tablet 0  . predniSONE (DELTASONE) 20 MG tablet as directed.      No current facility-administered medications for this visit.    Facility-Administered Medications Ordered in Other Visits  Medication Dose Route Frequency Provider Last Rate Last Dose  . methotrexate (PF) 12 mg in sodium  chloride 0.9 % INTRATHECAL chemo injection   Intrathecal Once Corcoran, Melissa C, MD      . methotrexate (PF) 12 mg in sodium chloride 0.9 % INTRATHECAL chemo injection   Intrathecal Once Lequita Asal, MD        Review of Systems:  GENERAL:  Feels "fine".  No fevers or sweats.  Weight stable. PERFORMANCE STATUS (ECOG):  1 HEENT:  Right ear bothersome.  No visual changes, sore throat, mouth sores or tenderness. Lungs: No shortness of breath or cough.  No hemoptysis. Cardiac:  No chest pain, palpitations, orthopnea, or PND. GI: Eating well.  No nausea, vomiting, diarrhea, constipation, melena or hematochezia. GU:  Enlarged prostate.  No urgency, frequency, dysuria, or hematuria. Musculoskeletal:  Right rib pain.  No back pain.  No joint pain.  No muscle tenderness. Extremities:  No pain or swelling. Skin:  Nail changes.  No rashes or skin changes. Neuro:  No headache, numbness or weakness, balance or coordination issues. Endocrine:  No diabetes, thyroid issues, hot flashes or night sweats. Psych:  No mood changes, depression or anxiety. Pain:  Right ribcage pain. Review of systems:  All other systems reviewed and found to be negative.  Physical Exam: Blood pressure (!) 129/52, pulse (!) 47, temperature (!) 96.3 F (35.7 C), temperature source Tympanic, resp. rate 18, weight 126 lb 6 oz (57.3 kg). GENERAL:  Thin elderly  gentleman sitting comfortably in the exam room in no acute distress. He has a cane at his side. MENTAL STATUS:  Alert and oriented to person, place and time. HEAD:  Lu Duffel.  Temporal wasting.  Normocephalic, atraumatic, face symmetric, no Cushingoid features. EYES:  Glasses.  Brown eyes.  Pupils equal round and reactive to light and accomodation.  No conjunctivitis or scleral icterus. ENT:  Oropharynx clear without lesion.  Edentulous.  Tongue normal. Mucous membranes moist.  Right ear canal and tympanic membrane unremarkable. RESPIRATORY:  Clear to auscultation without rales, wheezes or rhonchi. CARDIOVASCULAR:  Regular rate and rhythm without murmur, rub or gallop. ABDOMEN:  Soft, non-tender, with active bowel sounds, and no hepatosplenomegaly.  No masses.   SKIN:  No rashes, ulcers or lesions. EXTREMITIES:  No edema, no skin discoloration or tenderness.  Right upper extremity with dilated vascular s/p AV fistula (chronic).  No palpable cords. NEUROLOGICAL: Unremarkable. PSYCH:  Appropriate.   Imaging studies:  04/28/2016 PET scan:  Bulky intensely hypermetabolic periaortic upper abdominal and mesenteric adenopathy concerning for high-grade lymphoma.  There was hypermetabolic liver metastasis.  There was hpermetabolic lesion involving the small bowel of the upper pelvis.  There were multiple sites of hypermetabolic skeletal metastasis. There was moderate volume right pneumothorax. 08/12/2016 PET scan:  Interval response to therapy. There has been significant decrease in extent of hypermetabolic tumor within the neck, chest, abdomen and pelvis as well as the axial and appendicular skeleton.  There was residual enlarged and hypermetabolic small bowel mesenteric and periaortic lymph nodes. There was a persistent hypermetabolic focus of increased uptake within the spleen.  There was resolution of previous multifocal hypermetabolic lesions within the liver. 10/10/2016 PET scan:  Slight worsening  compared to the prior study. Several lesions were slightly more hypermetabolic and there appeared to be a newly enlarged and hypermetabolic mesenteric node adjacent to one of the previous mesenteric lymph nodes. However, there are no new hypermetabolic lesions in the neck, chest, or skeleton.  The small hypermetabolic peripheral lesion of the right kidney upper pole is slightly less hypermetabolic. 78/29/5621 PET scan:  Marked progression of disease as evidenced by progressive bulky mesenteric adenopathy, new low left internal jugular/left juxta diaphragmatic/abdominal retroperitoneal adenopathy, new pulmonary nodules, new hepatic and splenic lesions and new peritoneal nodules, all of which are hypermetabolic   Hospital Outpatient Visit on 01/14/2017  Component Date Value Ref Range Status  . Glucose-Capillary 01/14/2017 87  65 - 99 mg/dL Final    Assessment:  JOMAR DENZ is a 76 y.o. male s/p renal transplant (2007) with a post-transplant lymphoproliferative disorder, stage IV diffuse large B cell lymphoma.  He presented with a 2-3 month history of progressive back pain superimposed on chronic back pain.    PET scan on 04/28/2016 revealed bulky intensely hypermetabolic periaortic upper abdominal and mesenteric adenopathy concerning for high-grade lymphoma.  There was hypermetabolic liver metastasis.  There was hpermetabolic lesion involving the small bowel of the upper pelvis.  There were multiple sites of hypermetabolic skeletal metastasis. There was moderate volume right pneumothorax.  CT guided retroperitoneal node biopsy on 04/30/2016 revealed diffuse large B cell lymphoma.  Hepatitis B and C testing on 05/06/2016 were negative.  Echo on 05/06/2016 revealed an EF of 55-60%.  Echo on 09/01/2016 revealed an EF of 60-65%.  Bone marrow aspirate and biopsy on 05/08/2016 revealed multifocal marrow involvement by diffuse large B-cell lymphoma. There was variably cellular marrow for age (50% - 90%) with  a patchy predominantly nodular large B-cell infiltrate, overall estimated to account for 20% of the core biopsy. There was adequate residual trilineage hematopoiesis with mild nonspecific dyserythropoiesis. There was patchy mild increase in reticulin. Storage iron was present. The immunohistochemical staining pattern of the B-cell infiltrate (CD10 +/-, BCL 6+, BCL-2 +) suggested possible large cell transformation of follicular lymphoma.  Flow cytometry revealed no significant immunophenotypic abnormalities or evidence of B-cell lymphoma.  He has bone metastasis and hypercalcemia.  Calcium was 11.2 (ionized 7.3) on 05/13/2016.  He received Zometa on 05/13/2016.  He began [place] on 06/09/2016 (last 08/05/2016).  Work-up on 04/15/2016 revealed the following normal studies: ferritin (343), iron saturation (6%), TIBC (241; low), B12 (525), folate (27).  Reticulocyte count was 2%.  LDH was 409.  Uric acid was 7.7 (4.4 - 7.6).  He was admitted at [facility] from 04/28/2016 - 04/30/2016 with a moderate volume right sided pneumothorax.  His pneumothorax improved spontaneously. Plain films of the right femur revealed the lucent bone lesion in the right femoral neck  was poorly characterized.  There was no evidence for an acute fracture.  PTH was 106 (high) 04/30/2016 with a calcium of 11.2.  Etiology was c/w primary hyperparathyroidism.  PTH-related polypeptide was < 1.1 on 04/30/2016.  He has a history of GI bleeding in 08/2014.  He underwent tagged RBC scan which revealed an active bleed in the hepatic flexure.  He was embolized in vascular interventional radiology.  He was admitted to [facility] from 07/08/2016 - 07/13/2016 with GI bleeding.  EGD on 07/09/2016 revealed multiple non-bleeding duodenal ulcer as well as duodenitis. Tagged RBC scan on 07/11/2016 revealed active GI bleed in the lateral right mid abdomen coursing through multiple curvilinear bowel loops in the right mid abdomen favoring a small bowel source of  bleeding.  He received 5 units of PRBCs, 2 units of pheresed platelets, and vitamin K from 07/07/2016 0 07/13/2016.  He was transferred to [facility] from 07/13/2016 - 07/16/2016.  [facility] performed a colonoscopy and push enteroscopy which showed several diverticula and old blood but no source of bleeding. Source of bleed was presumed to be  diverticular, although it was possible a small bowel site was not visualized on endoscopy.  He has a history of renal failure s/p renal transplant.  He is tacrolimus (Prograf), and steroids  Creatinine has ranged between 1.43 - 1.93 in the past 6 months. Mycophenolate (MMF) was discontinued at diagnosis.  Prograf level was 5.5 (3.0-8.0) on 05/08/2016 and 2.2 on 08/13/2016.   He received 6 cycles of mini-RCHOP (05/09/2016 - 09/05/2016).  Cycle #1 was complicated by fever and neutropenia.  All cultures were negative.  He was treated empirically with Cefepime.  Peripheral smear revealed schistocytes.  Cycle #3 was complicated by a GI bleed.  PET scan on 10/10/2016 revealed slight worsening compared to the prior study. Several lesions were slightly more hypermetabolic and there appeared to be a newly enlarged and hypermetabolic mesenteric node adjacent to one of the previous mesenteric lymph nodes. However, there are no new hypermetabolic lesions in the neck, chest, or skeleton.  The small hypermetabolic peripheral lesion of the right kidney upper pole is slightly less hypermetabolic.  He was lost to follow-up.  PET scan on 01/14/2017 revealed marked progression of disease as evidenced by progressive bulky mesenteric adenopathy, new low left internal jugular/left juxta diaphragmatic/abdominal retroperitoneal adenopathy, new pulmonary nodules, new hepatic and splenic lesions and new peritoneal nodules, all of which are hypermetabolic  CSF on 81/82/9937 revealed 7 WBCs (4% segs, 79% lymphs, and 17% monocytes).  He has undergone LP with IT MTX x 4 (07/24/2016, 08/14/2016,  09/04/2016, and 10/02/2016).  Cytology was negative on 07/24/2016 and 09/04/2016.  Symptomatically, he denies any B symptoms.  He is eating well.  Weight is stable.  Exam reveals no adenopathy.  Plan: 1.  Review PET scan.  Discuss concern for significant progression of disease.  Discuss treatment options.  Discuss salvage chemotherapy (RICE, DHAP + Rituxan, GemOx + Rituxan) followed by autologous stem cell transplant.  Unclear if patient would be candidate for autologous stem cell transplant.  If disease responds to salvage chemotherapy and not eligible for transplant, consider maintenance Revlimid. 2.  Phone follow-up with renal transplant team [facility]). 3.  Discuss urgent evaluation at [facility] (nephrology and oncology).  Patient sees Dr. Lonia Blood on 01/23/2017. 4.  Continue allopurinol. 5.  Labs today: CBC with diff, CMP, LDH, uric acid. 6.  Phone follow-up with Dr. Lonia Blood [facility]). 7.  Patient to call for follow-up after oncology/transplant evaluation at [facility].    Lequita Asal, MD  01/16/2017, 9:31 AM

## 2017-01-18 ENCOUNTER — Encounter: Payer: Self-pay | Admitting: Hematology and Oncology

## 2017-01-19 NOTE — Unmapped (Signed)
Progress note from [facility] dated 01/16/17 sent to HIM Willette Brace January 19, 2017 9:29 AM

## 2017-01-23 ENCOUNTER — Ambulatory Visit
Admission: RE | Admit: 2017-01-23 | Discharge: 2017-01-23 | Disposition: A | Discharge status: Home / Self Care | Payer: MEDICARE

## 2017-01-23 DIAGNOSIS — T8699 Other complications of unspecified transplanted organ and tissue: Secondary | ICD-10-CM

## 2017-01-23 DIAGNOSIS — C8330 Diffuse large B-cell lymphoma, unspecified site: Principal | ICD-10-CM

## 2017-01-23 DIAGNOSIS — R1310 Dysphagia, unspecified: Secondary | ICD-10-CM

## 2017-01-23 DIAGNOSIS — C8336 Diffuse large B-cell lymphoma, intrapelvic lymph nodes: Principal | ICD-10-CM

## 2017-01-23 DIAGNOSIS — D47Z1 Post-transplant lymphoproliferative disorder (PTLD): Secondary | ICD-10-CM

## 2017-01-23 LAB — COMPREHENSIVE METABOLIC PANEL
ALBUMIN: 4 g/dL (ref 3.5–5.0)
ALKALINE PHOSPHATASE: 84 U/L (ref 38–126)
ALT (SGPT): 19 U/L (ref 19–72)
ANION GAP: 7 mmol/L — ABNORMAL LOW (ref 9–15)
BILIRUBIN TOTAL: 0.6 mg/dL (ref 0.0–1.2)
BLOOD UREA NITROGEN: 24 mg/dL — ABNORMAL HIGH (ref 7–21)
BUN / CREAT RATIO: 18
CALCIUM: 10.8 mg/dL — ABNORMAL HIGH (ref 8.5–10.2)
CHLORIDE: 104 mmol/L (ref 98–107)
CO2: 29 mmol/L (ref 22.0–30.0)
CREATININE: 1.37 mg/dL — ABNORMAL HIGH (ref 0.70–1.30)
EGFR MDRD AF AMER: >=60 mL/min/{1.73_m2} (ref >=60)
EGFR MDRD NON AF AMER: 51 mL/min/{1.73_m2} — ABNORMAL LOW (ref >=60)
GLUCOSE RANDOM: 119 mg/dL (ref 65–179)
POTASSIUM: 4.5 mmol/L (ref 3.5–5.0)
SODIUM: 140 mmol/L (ref 135–145)

## 2017-01-23 LAB — CBC W/ AUTO DIFF
BASOPHILS ABSOLUTE COUNT: 0 10*9/L (ref 0.0–0.1)
EOSINOPHILS ABSOLUTE COUNT: 0 10*9/L (ref 0.0–0.4)
HEMOGLOBIN: 10.2 g/dL — ABNORMAL LOW (ref 13.5–17.5)
LARGE UNSTAINED CELLS: 3 % (ref 0–4)
LYMPHOCYTES ABSOLUTE COUNT: 0.4 10*9/L — ABNORMAL LOW (ref 1.5–5.0)
MEAN CORPUSCULAR HEMOGLOBIN CONC: 31.9 g/dL (ref 31.0–37.0)
MEAN CORPUSCULAR HEMOGLOBIN: 26.1 pg (ref 26.0–34.0)
MEAN CORPUSCULAR VOLUME: 81.8 fL (ref 80.0–100.0)
MEAN PLATELET VOLUME: 8.9 fL (ref 7.0–10.0)
MONOCYTES ABSOLUTE COUNT: 0.2 10*9/L (ref 0.2–0.8)
PLATELET COUNT: 122 10*9/L — ABNORMAL LOW (ref 150–440)
RED BLOOD CELL COUNT: 3.89 10*12/L — ABNORMAL LOW (ref 4.50–5.90)
RED CELL DISTRIBUTION WIDTH: 19.6 % — ABNORMAL HIGH (ref 12.0–15.0)
WBC ADJUSTED: 2.1 10*9/L — ABNORMAL LOW (ref 4.5–11.0)

## 2017-01-23 LAB — URIC ACID: Urate:MCnc:Pt:Ser/Plas:Qn:: 6.6

## 2017-01-23 LAB — MAGNESIUM: Magnesium:MCnc:Pt:Ser/Plas:Qn:: 2

## 2017-01-23 LAB — SODIUM: Sodium:SCnc:Pt:Ser/Plas:Qn:: 140

## 2017-01-23 LAB — LACTATE DEHYDROGENASE: Lactate dehydrogenase:CCnc:Pt:Ser/Plas:Qn:: 1262 — ABNORMAL HIGH

## 2017-01-23 LAB — HIV ANTIGEN/ANTIBODY COMBO: HIV 1+2 Ab+HIV1 p24 Ag:PrThr:Pt:Ser/Plas:Ord:IA: NONREACTIVE

## 2017-01-23 LAB — HEPATITIS B CORE TOTAL ANTIBODY: Hepatitis B virus core Ab:PrThr:Pt:Ser/Plas:Ord:IA: NONREACTIVE

## 2017-01-23 LAB — PLATELET COUNT: Lab: 122 — ABNORMAL LOW

## 2017-01-23 LAB — HEPATITIS B SURFACE ANTIBODY
HEPATITIS B SURFACE ANTIBODY QUANT: <8 m[IU]/mL (ref <8.00)
HEPATITIS B SURFACE ANTIBODY: NONREACTIVE

## 2017-01-23 LAB — HEPATITIS B SURFACE ANTIBODY QUANT: Hepatitis B virus surface Ab:ACnc:Pt:Ser:Qn:: <8

## 2017-01-23 LAB — GLUCOSE-6-PHOSPHATE DEHYDROGENASE QUAL: Lab: 7.7

## 2017-01-23 LAB — PHOSPHORUS: Phosphate:MCnc:Pt:Ser/Plas:Qn:: 2.8 — ABNORMAL LOW

## 2017-01-23 LAB — HEPATITIS B SURFACE ANTIGEN: Hepatitis B virus surface Ag:PrThr:Pt:Ser:Ord:: NONREACTIVE

## 2017-01-23 NOTE — Unmapped (Signed)
Please call us if you experience:   1. Nausea or vomiting not controlled by nausea medicines  2. Fever of 100.4 F or higher   3. Uncontrolled pain  4. Any other concerning symptom     For any cancer-related concerns, including:   Appointment information   Questions regarding your cancer diagnosis or treatment   Any new symptoms.     Please call [phone].    On Nights, Weekends and Holidays please and ask for the oncologist on call.    [facility]  [address]  [place], [place] [address]  [email or web address]

## 2017-01-27 LAB — EBV DNA DETECT/QUANT: Lab: NOT DETECTED

## 2017-01-27 NOTE — Unmapped (Signed)
IDENTIFICATION: This is a 76 y.o. male who presents for an initial visit.      Diagnosis: PTLD  Stage: IV  CNS Risk: low  GELF: N/A  Regimen: s/p R-miniCHOP x6 (10/2016)  The patient was referred to the [facility] by Dr. Merlene Pulling from [facility].    STAGING/WORKUP:  The patient has advanced, relapsed PTLD with involvement in the mesentery, L IJ LN, pulmonary nodules, peritoneum on recent PET (7/25). Compared to prior PET on 10/29/16, the disease has shown significant progression. The patient's CBC shows pancytopenia, and his CMP shows elevated creatinine, calcium, and normal LFTs. His LDH is elevated to 1262. HIV and HBV negative.    PROGNOSIS:  Relapsed PTLD is a/w a poor prognosis. Particularly in a patient with advanced age and poor PS, who is not a transplant candidate.    TREATMENT:  The patient was treated with frontline R-miniCHOP that completed in 10/2016. He has had a fast relapse of <3 mo. Unfortunately, options are limited for this patient. He is not a candidate for aggressive salvage therapy and consolidation with autoSCT. He could likely tolerate a lower intensity salvage regimen such as gem/ox (would avoid GDP because of the cisplatin and h/o renal transplant). If it will be covered by his insurance, would recommend obinutuzumab instead of rituximab as well. Of note, he would not be a candidate for the EBV-CTL study because he is not positive for EBV.  I asked hemepath to add on CD30 staining. If positive, brentuximab vedotin would be a nice option. Will d/w his primary oncologist, Dr. Merlene Pulling.     OTHER ISSUES:  Dysphagia: Pt states he has dysphagia to solids/liquids with regurgitation.     A total of 80 minutes were spent face-to-face with the patient during this encounter and over half of that time was spent on counseling and coordination of care.     Winona Legato, DO, MPH  Assistant Professor of Medicine  Division of Hematology and Oncology ---------------------------------------------------    HISTORY OF PRESENT ILLNESS:  The patient is a 23y M with a h/o a fib, GIB, and renal transplant in 2007 presenting for evaluation of his recurrent PTLD. Currently, he reports having significant weight loss as well as difficulty swallowing and regurgitating food and liquids. Denies fever/chills, Night sweats, DOE, CP.    PAST MEDICAL HISTORY:  Past Medical History:   Diagnosis Date   ??? Arthritis    ??? Chronic kidney disease    ??? Coronary artery disease    ??? Gout    ??? History of transfusion    ??? Hypertension    ??? Kidney transplant status, cadaveric 2006   ??? Peptic ulceration        MEDICATIONS:  Current Outpatient Prescriptions   Medication Sig Dispense Refill   ??? acetaminophen (TYLENOL) 325 MG tablet Take by mouth every six (6) hours as needed for pain.     ??? albuterol (PROAIR HFA) 90 mcg/actuation inhaler Inhale 2 puffs every four (4) hours as needed. 1 Inhaler 11   ??? allopurinol (ZYLOPRIM) 100 MG tablet TAKE 1 TABLET (100 MG TOTAL) BY MOUTH DAILY. 90 tablet 3   ??? carboxymethylcellulose sodium (THERATEARS) 0.25 % Drop Administer 2 drops to both eyes 4 (four) times a day as needed (Dry eyes). 30 mL 2   ??? colchicine 0.6 mg cap capsule Take 0.6 mg by mouth daily.     ??? finasteride (PROSCAR) 5 mg tablet TAKE 1 TABLET (5 MG TOTAL) BY MOUTH DAILY. 90 tablet  3   ??? furosemide (LASIX) 20 MG tablet 1-2 tablets daily as needed 60 tablet 11   ??? hydroxypropyl methylcellulose (ISOPTO TEARS) 2.5 % ophthalmic solution 1 drop as needed.     ??? metoprolol tartrate (LOPRESSOR) 25 MG tablet Take 0.5 tablets (12.5 mg total) by mouth Two (2) times a day. 30 tablet 11   ??? multivitamin (MULTIVITAMIN) per tablet Take 1 tablet by mouth daily.      ??? oxybutynin (DITROPAN-XL) 5 MG 24 hr tablet TAKE 1 TABLET (5 MG TOTAL) BY MOUTH ONCE DAILY. 90 tablet 3   ??? pantoprazole (PROTONIX) 40 MG tablet TAKE 1 TABLET BY MOUTH TWICE A DAY 60 tablet 1   ??? predniSONE (DELTASONE) 5 MG tablet Take 1 tablet (5 mg total) by mouth daily. 90 tablet 4   ??? sucralfate (CARAFATE) 1 gram tablet Take 1 g by mouth Four (4) times a day.     ??? tacrolimus (PROGRAF) 1 MG capsule Take 3 capsules (3mg ) in the morning and 3 capsules (3mg ) at night. Z94.0 180 capsule 11   ??? tamsulosin (FLOMAX) 0.4 mg capsule TAKE 1 CAPSULE (0.4 MG TOTAL) BY MOUTH TWO (2) TIMES A DAY. 180 capsule 3     No current facility-administered medications for this visit.        ALLERGIES:  No Known Allergies    SOCIAL HISTORY:  Social History     Social History   ??? Marital status: Single     Spouse name: N/A   ??? Number of children: N/A   ??? Years of education: N/A     Social History Main Topics   ??? Smoking status: Former Smoker     Quit date: 03/17/1980   ??? Smokeless tobacco: Never Used   ??? Alcohol use No   ??? Drug use: No   ??? Sexual activity: Not on file     Other Topics Concern   ??? Not on file     Social History Narrative   ??? No narrative on file       FAMILY HISTORY:  Family History   Problem Relation Age of Onset   ??? Cancer Mother    ??? Anesthesia problems Neg Hx        REVIEW OF SYSTEMS:  See HPI. A 10 system ROS is otherwise negative.    VITAL SIGNS:   Vitals:    01/23/17 1013   BP: 148/66   Pulse: 50   Resp: 20   Temp: 36.5 ??C (97.7 ??F)   TempSrc: Oral   SpO2: 97%   Weight: 55.4 kg (122 lb 3.2 oz)   Height: 167.6 cm (5' 5.98)       EXAM:  ECOG: 2  CONST: Frail, NAD, Awake, Alert  HEENT: Oropharynx clear.   LYMPH: L Cervical LN; no palpable axillary, inguinal.   RESP: Clear to auscultation bilaterally.  CV: Irregular rhythm. No rubs, gallops or murmurs.   GI: Soft, nontender, nondistended. No hepatosplenomegaly.   MSK: trace edema  NEURO: No focal deficits    LABORATORY:  Office Visit on 01/23/2017   Component Date Value Ref Range Status   ??? Sodium 01/23/2017 140  135 - 145 mmol/L Final   ??? Potassium 01/23/2017 4.5  3.5 - 5.0 mmol/L Final   ??? Chloride 01/23/2017 104  98 - 107 mmol/L Final   ??? CO2 01/23/2017 29.0  22.0 - 30.0 mmol/L Final   ??? BUN 01/23/2017 24* 7 - 21 mg/dL Final   ??? Creatinine 01/23/2017 1.37* 0.70 -  1.30 mg/dL Final   ??? BUN/Creatinine Ratio 01/23/2017 18   Final   ??? EGFR MDRD Non Af Amer 01/23/2017 51* >=60 mL/min/1.66m2 Final   ??? EGFR MDRD Af Amer 01/23/2017 >=60  >=60 mL/min/1.81m2 Final   ??? Anion Gap 01/23/2017 7* 9 - 15 mmol/L Final   ??? Glucose 01/23/2017 119  65 - 179 mg/dL Final   ??? Calcium 16/03/9603 10.8* 8.5 - 10.2 mg/dL Final   ??? Albumin 54/02/8118 4.0  3.5 - 5.0 g/dL Final   ??? Total Protein 01/23/2017 6.4* 6.5 - 8.3 g/dL Final   ??? Total Bilirubin 01/23/2017 0.6  0.0 - 1.2 mg/dL Final   ??? AST 14/78/2956 35  19 - 55 U/L Final   ??? ALT 01/23/2017 19  19 - 72 U/L Final   ??? Alkaline Phosphatase 01/23/2017 84  38 - 126 U/L Final   ??? LDH 01/23/2017 1262* 338 - 610 U/L Final   ??? Uric Acid 01/23/2017 6.6  4.0 - 9.0 mg/dL Final   ??? Phosphorus 01/23/2017 2.8* 2.9 - 4.7 mg/dL Final   ??? Magnesium 21/30/8657 2.0  1.6 - 2.2 mg/dL Final   ??? Q4ON 62/95/2841 7.7  5.3 - 10.3 U/g/dL Final   ??? Hep B S Ab 01/23/2017 Nonreactive  Nonreactive, Grayzone Final      Nonreactive and Grayzone results are considered non-immune.   ??? Hepatitis B Surface Ab Quant 01/23/2017 <8.00  <8.00 m(IU)/mL Final   ??? Hep B Core Total Ab 01/23/2017 Nonreactive  Nonreactive Final   ??? Hepatitis B Surface Ag 01/23/2017 Nonreactive  Nonreactive Final   ??? HIV Antigen/Antibody Combo 01/23/2017 Nonreactive  Nonreactive Final   ??? WBC 01/23/2017 2.1* 4.5 - 11.0 10*9/L Final   ??? RBC 01/23/2017 3.89* 4.50 - 5.90 10*12/L Final   ??? HGB 01/23/2017 10.2* 13.5 - 17.5 g/dL Final   ??? HCT 32/44/0102 31.9* 41.0 - 53.0 % Final   ??? MCV 01/23/2017 81.8  80.0 - 100.0 fL Final   ??? MCH 01/23/2017 26.1  26.0 - 34.0 pg Final   ??? MCHC 01/23/2017 31.9  31.0 - 37.0 g/dL Final   ??? RDW 72/53/6644 19.6* 12.0 - 15.0 % Final   ??? MPV 01/23/2017 8.9  7.0 - 10.0 fL Final   ??? Platelet 01/23/2017 122* 150 - 440 10*9/L Final   ??? Variable HGB Concentration 01/23/2017 Slight* Not Present Final   ??? Absolute Neutrophils 01/23/2017 1.5* 2.0 - 7.5 10*9/L Final   ??? Absolute Lymphocytes 01/23/2017 0.4* 1.5 - 5.0 10*9/L Final   ??? Absolute Monocytes 01/23/2017 0.2  0.2 - 0.8 10*9/L Final   ??? Absolute Eosinophils 01/23/2017 0.0  0.0 - 0.4 10*9/L Final   ??? Absolute Basophils 01/23/2017 0.0  0.0 - 0.1 10*9/L Final   ??? Large Unstained Cells 01/23/2017 3  0 - 4 % Final   ??? Microcytosis 01/23/2017 Moderate* Not Present Final   ??? Anisocytosis 01/23/2017 Moderate* Not Present Final   ??? Hypochromasia 01/23/2017 Moderate* Not Present Final     IMAGING:  PET-CT (725/18):  1. Marked progression of disease as evidenced by progressive bulky mesenteric adenopathy, new low left internal jugular/left juxtadiaphragmatic/abdominal retroperitoneal adenopathy, new pulmonary  nodules, new hepatic and splenic lesions and new peritoneal nodules, all of which are hypermetabolic.    PET-CT (10/29/16):  Hypermetabolic splenic foci and mesenteric hypermetabolic masses, worrisome for lymphoma involvement. Lymphoma involvement in subtle retroperitoneal lymph nodes is also suspected.  0.8 cm hypermetabolic nodule in the right kidney, worrisome for a primary renal malignancy  process. Further evaluation is recommended.    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

## 2017-01-28 ENCOUNTER — Telehealth: Payer: Self-pay | Admitting: *Deleted

## 2017-01-28 MED ORDER — TACROLIMUS 1 MG CAPSULE
ORAL_CAPSULE | 11 refills | 0 days | Status: CP
Start: 2017-01-28 — End: 2017-07-16

## 2017-01-28 MED ORDER — PROGRAF 1 MG CAPSULE
ORAL_CAPSULE | 11 refills | 0 days
Start: 2017-01-28 — End: 2017-01-28

## 2017-01-28 MED FILL — PROGRAF/1MG/CAP: PROGRAF/1MG/CAP | 30 days supply | Qty: 180 | Fill #0

## 2017-01-28 NOTE — Telephone Encounter (Signed)
Returned call to family and discussed appts.  Instructed them to call [facility] for f/u appt. Voiced understanding.

## 2017-01-28 NOTE — Unmapped (Signed)
[facility] Refill Coordination Note  Specialty Medication(s): PROGRAF 1MG   Additional Medications shipped: NO    Taylor Ramos, DOB: May 01, 1941  Phone: [phone] (home) , Alternate phone contact: N/A  Phone or address changes today?: No  All above HIPAA information was verified with patient.  Shipping Address: [address]  [place] [address]   Insurance changes? No    Completed refill call assessment today to schedule patient's medication shipment from the [facility] [phone]).      Confirmed the medication and dosage are correct and have not changed: Yes, regimen is correct and unchanged.    Confirmed patient started or stopped the following medications in the past month:  No, there are no changes reported at this time.    Are you tolerating your medication?:  [name] reports tolerating the medication.    ADHERENCE    (Below is required for Medicare Part B or Transplant patients only - per drug):   How many tablets were dispensed last month: 180  Patient currently has 24 remaining.    Did you miss any doses in the past 4 weeks? No missed doses reported.    FINANCIAL/SHIPPING    Delivery Scheduled: Yes, Expected medication delivery date: 01/29/17.  However, Rx request for refills was sent to the provider as there are none remaining.     Ziah did not have any additional questions at this time.    Delivery address validated in [facility] scheduling system: Yes, address listed in [facility] is correct.    We will follow up with patient monthly for standard refill processing and delivery.      Thank you,  Marletta Lor   [facility] Specialty Pharmacist

## 2017-01-29 ENCOUNTER — Telehealth: Payer: Self-pay | Admitting: *Deleted

## 2017-01-29 ENCOUNTER — Other Ambulatory Visit: Payer: Self-pay | Admitting: *Deleted

## 2017-01-29 ENCOUNTER — Other Ambulatory Visit: Payer: Self-pay | Admitting: Hematology and Oncology

## 2017-01-29 DIAGNOSIS — C8333 Diffuse large B-cell lymphoma, intra-abdominal lymph nodes: Secondary | ICD-10-CM

## 2017-01-29 MED ORDER — OXYCODONE-ACETAMINOPHEN 5-325 MG PO TABS: 1.0000 | ORAL_TABLET | Freq: Four times a day (QID) | ORAL | 0 refills | Status: DC | PRN

## 2017-01-29 NOTE — Telephone Encounter (Signed)
  Patient was scheduled to see Dr. Lonia Blood and his nephrologist (he is s/p renal transplant).  Will see if he saw them for recommendations.  M

## 2017-01-29 NOTE — Telephone Encounter (Signed)
[name] called requesting a prescription for Hydrocodone for pain. He does not have it on his med list and the last pain med given for Oxycodone was in February. Please advise

## 2017-01-29 NOTE — Telephone Encounter (Signed)
done

## 2017-01-29 NOTE — Telephone Encounter (Signed)
Called patient to discuss appts, discussed with family that the patient saw dr. Lonia Blood and we would get his note and discuss with dr. Mike Gip and call her back with appt voiced understanding.

## 2017-01-29 NOTE — Telephone Encounter (Signed)
Called patients wife and discussed pain med, wife meant oxycodone, discussed with Dr. Mike Gip and it was ordered. Family will pick up today.

## 2017-01-29 NOTE — Progress Notes (Signed)
DISCONTINUE ON PATHWAY REGIMEN - Lymphoma and CLL     A cycle is every 21 days:     Rituximab        Dose Mod: None     Cyclophosphamide        Dose Mod: None     Doxorubicin        Dose Mod: None     Vincristine        Dose Mod: None     Prednisone        Dose Mod: None  **Always confirm dose/schedule in your pharmacy ordering system**    REASON: Disease Progression PRIOR TREATMENT: [id number]: R-CHOP q21 Days x 6 Cycles TREATMENT RESPONSE: Partial Response (PR)  START ON PATHWAY REGIMEN - Lymphoma and CLL  Other Clinical Trial: gemcitabine  Patient Characteristics: Diffuse Large B Cell Lymphoma, Relapsed / Refractory, All Stages,  Second Line, Not a Transplant Candidate Disease Type: Not Applicable Disease Type: Diffuse Large B Cell Line of therapy: Relapsed / Refractory - Second Line [place] Stage: IV Patient Characteristics: Not a Transplant Candidate Intent of Therapy: Non-Curative / Palliative Intent, Discussed with Patient

## 2017-01-29 NOTE — Telephone Encounter (Signed)
[name] called and said that he does not see Urology until 03/04/17 and is asking when he is to see Dr Humberto Seals. Please advise.

## 2017-01-30 ENCOUNTER — Encounter: Payer: Self-pay | Admitting: Hematology and Oncology

## 2017-01-30 ENCOUNTER — Other Ambulatory Visit: Payer: Self-pay | Admitting: Hematology and Oncology

## 2017-01-30 DIAGNOSIS — C8339 Diffuse large B-cell lymphoma, extranodal and solid organ sites: Secondary | ICD-10-CM | POA: Insufficient documentation

## 2017-02-02 ENCOUNTER — Telehealth: Payer: Self-pay | Admitting: *Deleted

## 2017-02-02 ENCOUNTER — Other Ambulatory Visit: Payer: Self-pay | Admitting: Urgent Care

## 2017-02-02 ENCOUNTER — Telehealth: Payer: Self-pay | Admitting: Urgent Care

## 2017-02-02 ENCOUNTER — Other Ambulatory Visit: Payer: Self-pay | Admitting: *Deleted

## 2017-02-02 DIAGNOSIS — Z8579 Personal history of other malignant neoplasms of lymphoid, hematopoietic and related tissues: Secondary | ICD-10-CM

## 2017-02-02 DIAGNOSIS — C8330 Diffuse large B-cell lymphoma, unspecified site: Secondary | ICD-10-CM

## 2017-02-02 DIAGNOSIS — Z8572 Personal history of non-Hodgkin lymphomas: Secondary | ICD-10-CM

## 2017-02-02 NOTE — Telephone Encounter (Signed)
Called patient and talked to Matthew Brown regarding Dr. Mike Gip wanting to change patient's chemo treatment.  Patient will not be able to get labs and see MD on the same day as treatment due to the treatment alone being 8 hours the first day and 6 hours the second day.  We have scheduled patient to come in for lab/MD on Tuesday, August 14th @ 9:45 for labs and 10:00 for MD.  On Wednesday, patient will need to he here @ 8:30 for treatment and Thursday @ 9:30 for treatment.  Matthew Brown, verbalized understanding and is in agreement with appointments.    Also asked if patient is taking allpurinol.  She verified that he is.  MD notified.

## 2017-02-02 NOTE — Telephone Encounter (Signed)
-----   Message from Lequita Asal, MD sent at 02/02/2017 12:06 PM EDT ----- Regarding: RE: Medication PA  Can we set him up for MD, labs, and treatment this week?  Is he still on allopurinol (he needs to be)?  M  ----- Message ----- From: Karen Kitchens, NP Sent: 02/02/2017   8:41 AM To: Lequita Asal, MD Subject: Medication PA                                  New medication was approved already. Brandi called me this morning.

## 2017-02-02 NOTE — Telephone Encounter (Signed)
Still attempting to contact patient. Alternate contacts attempted. No ability to leave voicemail with either (Lutricia or [place]). Will continue to attempt to make contact with patient.

## 2017-02-02 NOTE — Telephone Encounter (Signed)
Attempted to call patient. No answer, will try again later.

## 2017-02-02 NOTE — Telephone Encounter (Signed)
Attempted to call patient to make him aware of appointments that have been scheduled for him this week. After discussing his plan of care with Dr. Mike Gip, I have worked with infusion and scheduling to be able to accommodate patient's care needs for this week. We have been unable to make patient aware of what has been scheduled. No voicemail availability. Will attempt to contact patient again this afternoon.

## 2017-02-03 ENCOUNTER — Inpatient Hospital Stay (HOSPITAL_BASED_OUTPATIENT_CLINIC_OR_DEPARTMENT_OTHER): Payer: Medicare HMO | Admitting: Hematology and Oncology

## 2017-02-03 ENCOUNTER — Encounter: Payer: Self-pay | Admitting: Hematology and Oncology

## 2017-02-03 ENCOUNTER — Other Ambulatory Visit: Payer: Self-pay | Admitting: Hematology and Oncology

## 2017-02-03 ENCOUNTER — Inpatient Hospital Stay: Payer: Medicare HMO | Attending: Hematology and Oncology

## 2017-02-03 VITALS — BP 130/56 | HR 51 | Temp 96.5°F | Resp 18 | Wt 124.4 lb

## 2017-02-03 DIAGNOSIS — Z8719 Personal history of other diseases of the digestive system: Secondary | ICD-10-CM | POA: Insufficient documentation

## 2017-02-03 DIAGNOSIS — I129 Hypertensive chronic kidney disease with stage 1 through stage 4 chronic kidney disease, or unspecified chronic kidney disease: Secondary | ICD-10-CM | POA: Diagnosis not present

## 2017-02-03 DIAGNOSIS — Z7689 Persons encountering health services in other specified circumstances: Secondary | ICD-10-CM | POA: Insufficient documentation

## 2017-02-03 DIAGNOSIS — C7951 Secondary malignant neoplasm of bone: Secondary | ICD-10-CM

## 2017-02-03 DIAGNOSIS — Z8579 Personal history of other malignant neoplasms of lymphoid, hematopoietic and related tissues: Secondary | ICD-10-CM

## 2017-02-03 DIAGNOSIS — M1990 Unspecified osteoarthritis, unspecified site: Secondary | ICD-10-CM | POA: Insufficient documentation

## 2017-02-03 DIAGNOSIS — R1032 Left lower quadrant pain: Secondary | ICD-10-CM | POA: Insufficient documentation

## 2017-02-03 DIAGNOSIS — Z940 Kidney transplant status: Secondary | ICD-10-CM | POA: Insufficient documentation

## 2017-02-03 DIAGNOSIS — I4891 Unspecified atrial fibrillation: Secondary | ICD-10-CM | POA: Diagnosis not present

## 2017-02-03 DIAGNOSIS — Z7952 Long term (current) use of systemic steroids: Secondary | ICD-10-CM | POA: Insufficient documentation

## 2017-02-03 DIAGNOSIS — C787 Secondary malignant neoplasm of liver and intrahepatic bile duct: Secondary | ICD-10-CM

## 2017-02-03 DIAGNOSIS — Z5112 Encounter for antineoplastic immunotherapy: Secondary | ICD-10-CM | POA: Diagnosis not present

## 2017-02-03 DIAGNOSIS — N186 End stage renal disease: Secondary | ICD-10-CM | POA: Diagnosis not present

## 2017-02-03 DIAGNOSIS — R630 Anorexia: Secondary | ICD-10-CM | POA: Insufficient documentation

## 2017-02-03 DIAGNOSIS — E042 Nontoxic multinodular goiter: Secondary | ICD-10-CM | POA: Diagnosis not present

## 2017-02-03 DIAGNOSIS — C7800 Secondary malignant neoplasm of unspecified lung: Secondary | ICD-10-CM | POA: Diagnosis not present

## 2017-02-03 DIAGNOSIS — I272 Pulmonary hypertension, unspecified: Secondary | ICD-10-CM | POA: Diagnosis not present

## 2017-02-03 DIAGNOSIS — R51 Headache: Secondary | ICD-10-CM | POA: Insufficient documentation

## 2017-02-03 DIAGNOSIS — E210 Primary hyperparathyroidism: Secondary | ICD-10-CM | POA: Insufficient documentation

## 2017-02-03 DIAGNOSIS — Z8619 Personal history of other infectious and parasitic diseases: Secondary | ICD-10-CM | POA: Insufficient documentation

## 2017-02-03 DIAGNOSIS — K219 Gastro-esophageal reflux disease without esophagitis: Secondary | ICD-10-CM | POA: Insufficient documentation

## 2017-02-03 DIAGNOSIS — E785 Hyperlipidemia, unspecified: Secondary | ICD-10-CM | POA: Diagnosis not present

## 2017-02-03 DIAGNOSIS — R1012 Left upper quadrant pain: Secondary | ICD-10-CM | POA: Diagnosis not present

## 2017-02-03 DIAGNOSIS — Z800 Family history of malignant neoplasm of digestive organs: Secondary | ICD-10-CM | POA: Insufficient documentation

## 2017-02-03 DIAGNOSIS — Z5111 Encounter for antineoplastic chemotherapy: Secondary | ICD-10-CM

## 2017-02-03 DIAGNOSIS — C8333 Diffuse large B-cell lymphoma, intra-abdominal lymph nodes: Secondary | ICD-10-CM | POA: Diagnosis not present

## 2017-02-03 DIAGNOSIS — K4090 Unilateral inguinal hernia, without obstruction or gangrene, not specified as recurrent: Secondary | ICD-10-CM | POA: Insufficient documentation

## 2017-02-03 DIAGNOSIS — M109 Gout, unspecified: Secondary | ICD-10-CM | POA: Diagnosis not present

## 2017-02-03 DIAGNOSIS — C7889 Secondary malignant neoplasm of other digestive organs: Secondary | ICD-10-CM | POA: Diagnosis not present

## 2017-02-03 DIAGNOSIS — R634 Abnormal weight loss: Secondary | ICD-10-CM | POA: Diagnosis not present

## 2017-02-03 DIAGNOSIS — N400 Enlarged prostate without lower urinary tract symptoms: Secondary | ICD-10-CM | POA: Diagnosis not present

## 2017-02-03 DIAGNOSIS — Z79899 Other long term (current) drug therapy: Secondary | ICD-10-CM | POA: Insufficient documentation

## 2017-02-03 DIAGNOSIS — D47Z1 Post-transplant lymphoproliferative disorder (PTLD): Secondary | ICD-10-CM | POA: Insufficient documentation

## 2017-02-03 DIAGNOSIS — Z87891 Personal history of nicotine dependence: Secondary | ICD-10-CM | POA: Diagnosis not present

## 2017-02-03 LAB — CBC WITH DIFFERENTIAL/PLATELET
Basophils Absolute: 0 10*3/uL (ref 0–0.1)
Basophils Relative: 1 %
Eosinophils Absolute: 0.1 10*3/uL (ref 0–0.7)
Eosinophils Relative: 3 %
HCT: 29.2 % — ABNORMAL LOW (ref 40.0–52.0)
Hemoglobin: 9.9 g/dL — ABNORMAL LOW (ref 13.0–18.0)
Lymphocytes Relative: 18 %
Lymphs Abs: 0.5 10*3/uL — ABNORMAL LOW (ref 1.0–3.6)
MCH: 27.3 pg (ref 26.0–34.0)
MCHC: 34 g/dL (ref 32.0–36.0)
MCV: 80.3 fL (ref 80.0–100.0)
Monocytes Absolute: 0.5 10*3/uL (ref 0.2–1.0)
Monocytes Relative: 18 %
Neutro Abs: 1.6 10*3/uL (ref 1.4–6.5)
Neutrophils Relative %: 60 %
Platelets: 135 10*3/uL — ABNORMAL LOW (ref 150–440)
RBC: 3.64 MIL/uL — ABNORMAL LOW (ref 4.40–5.90)
RDW: 21 % — ABNORMAL HIGH (ref 11.5–14.5)
WBC: 2.7 10*3/uL — ABNORMAL LOW (ref 3.8–10.6)

## 2017-02-03 LAB — URIC ACID: Uric Acid, Serum: 6.5 mg/dL (ref 4.4–7.6)

## 2017-02-03 LAB — COMPREHENSIVE METABOLIC PANEL
ALT: 13 U/L — ABNORMAL LOW (ref 17–63)
AST: 32 U/L (ref 15–41)
Albumin: 3.9 g/dL (ref 3.5–5.0)
Alkaline Phosphatase: 90 U/L (ref 38–126)
Anion gap: 7 (ref 5–15)
BUN: 26 mg/dL — ABNORMAL HIGH (ref 6–20)
CO2: 28 mmol/L (ref 22–32)
Calcium: 10.9 mg/dL — ABNORMAL HIGH (ref 8.9–10.3)
Chloride: 105 mmol/L (ref 101–111)
Creatinine, Ser: 1.27 mg/dL — ABNORMAL HIGH (ref 0.61–1.24)
GFR calc Af Amer: >60 mL/min (ref >60)
GFR calc non Af Amer: 53 mL/min — ABNORMAL LOW (ref >60)
Glucose, Bld: 105 mg/dL — ABNORMAL HIGH (ref 65–99)
Potassium: 4.3 mmol/L (ref 3.5–5.1)
Sodium: 140 mmol/L (ref 135–145)
Total Bilirubin: 0.7 mg/dL (ref 0.3–1.2)
Total Protein: 6.7 g/dL (ref 6.5–8.1)

## 2017-02-03 NOTE — Patient Instructions (Signed)
Oxaliplatin Injection What is this medicine? OXALIPLATIN (ox AL i PLA tin) is a chemotherapy drug. It targets fast dividing cells, like cancer cells, and causes these cells to die. This medicine is used to treat cancers of the colon and rectum, and many other cancers. This medicine may be used for other purposes; ask your health care provider or pharmacist if you have questions. COMMON BRAND NAME(S): Eloxatin What should I tell my health care provider before I take this medicine? They need to know if you have any of these conditions: -kidney disease -an unusual or allergic reaction to oxaliplatin, other chemotherapy, other medicines, foods, dyes, or preservatives -pregnant or trying to get pregnant -breast-feeding How should I use this medicine? This drug is given as an infusion into a vein. It is administered in a hospital or clinic by a specially trained health care professional. Talk to your pediatrician regarding the use of this medicine in children. Special care may be needed. Overdosage: If you think you have taken too much of this medicine contact a poison control center or emergency room at once. NOTE: This medicine is only for you. Do not share this medicine with others. What if I miss a dose? It is important not to miss a dose. Call your doctor or health care professional if you are unable to keep an appointment. What may interact with this medicine? -medicines to increase blood counts like filgrastim, pegfilgrastim, sargramostim -probenecid -some antibiotics like amikacin, gentamicin, neomycin, polymyxin B, streptomycin, tobramycin -zalcitabine Talk to your doctor or health care professional before taking any of these medicines: -acetaminophen -aspirin -ibuprofen -ketoprofen -naproxen This list may not describe all possible interactions. Give your health care provider a list of all the medicines, herbs, non-prescription drugs, or dietary supplements you use. Also tell them  if you smoke, drink alcohol, or use illegal drugs. Some items may interact with your medicine. What should I watch for while using this medicine? Your condition will be monitored carefully while you are receiving this medicine. You will need important blood work done while you are taking this medicine. This medicine can make you more sensitive to cold. Do not drink cold drinks or use ice. Cover exposed skin before coming in contact with cold temperatures or cold objects. When out in cold weather wear warm clothing and cover your mouth and nose to warm the air that goes into your lungs. Tell your doctor if you get sensitive to the cold. This drug may make you feel generally unwell. This is not uncommon, as chemotherapy can affect healthy cells as well as cancer cells. Report any side effects. Continue your course of treatment even though you feel ill unless your doctor tells you to stop. In some cases, you may be given additional medicines to help with side effects. Follow all directions for their use. Call your doctor or health care professional for advice if you get a fever, chills or sore throat, or other symptoms of a cold or flu. Do not treat yourself. This drug decreases your body's ability to fight infections. Try to avoid being around people who are sick. This medicine may increase your risk to bruise or bleed. Call your doctor or health care professional if you notice any unusual bleeding. Be careful brushing and flossing your teeth or using a toothpick because you may get an infection or bleed more easily. If you have any dental work done, tell your dentist you are receiving this medicine. Avoid taking products that contain aspirin, acetaminophen, ibuprofen, naproxen,   or ketoprofen unless instructed by your doctor. These medicines may hide a fever. Do not become pregnant while taking this medicine. Women should inform their doctor if they wish to become pregnant or think they might be pregnant.  There is a potential for serious side effects to an unborn child. Talk to your health care professional or pharmacist for more information. Do not breast-feed an infant while taking this medicine. Call your doctor or health care professional if you get diarrhea. Do not treat yourself. What side effects may I notice from receiving this medicine? Side effects that you should report to your doctor or health care professional as soon as possible: -allergic reactions like skin rash, itching or hives, swelling of the face, lips, or tongue -low blood counts - This drug may decrease the number of white blood cells, red blood cells and platelets. You may be at increased risk for infections and bleeding. -signs of infection - fever or chills, cough, sore throat, pain or difficulty passing urine -signs of decreased platelets or bleeding - bruising, pinpoint red spots on the skin, black, tarry stools, nosebleeds -signs of decreased red blood cells - unusually weak or tired, fainting spells, lightheadedness -breathing problems -chest pain, pressure -cough -diarrhea -jaw tightness -mouth sores -nausea and vomiting -pain, swelling, redness or irritation at the injection site -pain, tingling, numbness in the hands or feet -problems with balance, talking, walking -redness, blistering, peeling or loosening of the skin, including inside the mouth -trouble passing urine or change in the amount of urine Side effects that usually do not require medical attention (report to your doctor or health care professional if they continue or are bothersome): -changes in vision -constipation -hair loss -loss of appetite -metallic taste in the mouth or changes in taste -stomach pain This list may not describe all possible side effects. Call your doctor for medical advice about side effects. You may report side effects to FDA at 1-800-FDA-1088. Where should I keep my medicine? This drug is given in a hospital or clinic  and will not be stored at home. NOTE: This sheet is a summary. It may not cover all possible information. If you have questions about this medicine, talk to your doctor, pharmacist, or health care provider.  2018 [employer] (2008-01-04 17:22:47) Gemcitabine injection What is this medicine? GEMCITABINE (jem SIT a been) is a chemotherapy drug. This medicine is used to treat many types of cancer like breast cancer, lung cancer, pancreatic cancer, and ovarian cancer. This medicine may be used for other purposes; ask your health care provider or pharmacist if you have questions. COMMON BRAND NAME(S): Gemzar What should I tell my health care provider before I take this medicine? They need to know if you have any of these conditions: -blood disorders -infection -kidney disease -liver disease -recent or ongoing radiation therapy -an unusual or allergic reaction to gemcitabine, other chemotherapy, other medicines, foods, dyes, or preservatives -pregnant or trying to get pregnant -breast-feeding How should I use this medicine? This drug is given as an infusion into a vein. It is administered in a hospital or clinic by a specially trained health care professional. Talk to your pediatrician regarding the use of this medicine in children. Special care may be needed. Overdosage: If you think you have taken too much of this medicine contact a poison control center or emergency room at once. NOTE: This medicine is only for you. Do not share this medicine with others. What if I miss a dose? It is important not  to miss your dose. Call your doctor or health care professional if you are unable to keep an appointment. What may interact with this medicine? -medicines to increase blood counts like filgrastim, pegfilgrastim, sargramostim -some other chemotherapy drugs like cisplatin -vaccines Talk to your doctor or health care professional before taking any of these  medicines: -acetaminophen -aspirin -ibuprofen -ketoprofen -naproxen This list may not describe all possible interactions. Give your health care provider a list of all the medicines, herbs, non-prescription drugs, or dietary supplements you use. Also tell them if you smoke, drink alcohol, or use illegal drugs. Some items may interact with your medicine. What should I watch for while using this medicine? Visit your doctor for checks on your progress. This drug may make you feel generally unwell. This is not uncommon, as chemotherapy can affect healthy cells as well as cancer cells. Report any side effects. Continue your course of treatment even though you feel ill unless your doctor tells you to stop. In some cases, you may be given additional medicines to help with side effects. Follow all directions for their use. Call your doctor or health care professional for advice if you get a fever, chills or sore throat, or other symptoms of a cold or flu. Do not treat yourself. This drug decreases your body's ability to fight infections. Try to avoid being around people who are sick. This medicine may increase your risk to bruise or bleed. Call your doctor or health care professional if you notice any unusual bleeding. Be careful brushing and flossing your teeth or using a toothpick because you may get an infection or bleed more easily. If you have any dental work done, tell your dentist you are receiving this medicine. Avoid taking products that contain aspirin, acetaminophen, ibuprofen, naproxen, or ketoprofen unless instructed by your doctor. These medicines may hide a fever. Women should inform their doctor if they wish to become pregnant or think they might be pregnant. There is a potential for serious side effects to an unborn child. Talk to your health care professional or pharmacist for more information. Do not breast-feed an infant while taking this medicine. What side effects may I notice from  receiving this medicine? Side effects that you should report to your doctor or health care professional as soon as possible: -allergic reactions like skin rash, itching or hives, swelling of the face, lips, or tongue -low blood counts - this medicine may decrease the number of white blood cells, red blood cells and platelets. You may be at increased risk for infections and bleeding. -signs of infection - fever or chills, cough, sore throat, pain or difficulty passing urine -signs of decreased platelets or bleeding - bruising, pinpoint red spots on the skin, black, tarry stools, blood in the urine -signs of decreased red blood cells - unusually weak or tired, fainting spells, lightheadedness -breathing problems -chest pain -mouth sores -nausea and vomiting -pain, swelling, redness at site where injected -pain, tingling, numbness in the hands or feet -stomach pain -swelling of ankles, feet, hands -unusual bleeding Side effects that usually do not require medical attention (report to your doctor or health care professional if they continue or are bothersome): -constipation -diarrhea -hair loss -loss of appetite -stomach upset This list may not describe all possible side effects. Call your doctor for medical advice about side effects. You may report side effects to FDA at 1-800-FDA-1088. Where should I keep my medicine? This drug is given in a hospital or clinic and will not be stored  at home. NOTE: This sheet is a summary. It may not cover all possible information. If you have questions about this medicine, talk to your doctor, pharmacist, or health care provider.  2018 [employer] (2007-10-19 18:45:54)

## 2017-02-03 NOTE — Progress Notes (Signed)
[facility] day:  02/03/2017  Chief Complaint: Matthew Brown is a 76 y.o. male with post-transplant lymphoproliferative disorder, stage IVBE diffuse large B cell lymphoma, who is seen for assessment prior to cycle #1 gemcitabine and oxaliplatin + obinutuzumab.  HPI:  The patient was last seen in the medical oncology clinic on 01/16/2017.  At that time, PET scan revealed marked progression of disease with bulky mesenteric adenopathy, new low left internal jugular/left juxta diaphragmatic/abdominal retroperitoneal adenopathy, new pulmonary nodules, new hepatic and splenic lesions and new peritoneal nodules.  We discussed potential salvage regimens.  It was unclear if he was a transplant candidate.  He was referred back to [facility] for assessment by his renal transplant team and oncology.  He met with Dr. Lonia Blood on 01/23/2017.  He has felt to have a poor prognosis secondary to his rapid relapse.  He was not a transplant candidate.  Chemotherapy options were reviewed including gemcitabine and oxaliplatin.  As he had rapidly progressed off Rituxan, obinutuzumab was suggested.  Symptomatically, he described LUQ pain last night.  He denies any nausea or vomiting.  He is eating.  He denies any bruising or bleeding.  He denies any adenopathy.   Past Medical History:  Diagnosis Date  . Benign prostatic hypertrophy   . Chronic headache 10/19/2015  . ED (erectile dysfunction)   . End stage renal disease ([place])   . Essential hypertension   . GERD (gastroesophageal reflux disease)   . GIB (gastrointestinal bleeding)    a. 0/0762 s/p R colic artery embolization;  b. 02/2015 EGD: duod ulcerative mass->Bx notable for coagulative necrosis - ? ischemia vs thrombosis-->coumadin d/c'd.  . Gout   . Hearing loss   . Hemorrhoids   . Hyperlipidemia   . Lymphoma ([place])   . Lymphoma ([place]) 2017  . Multiple thyroid nodules 06/06/2016   Noted on carotid US; dedicated US to be  ordered by staff  . Osteoarthrosis, unspecified whether generalized or localized, lower leg   . Persistent atrial fibrillation ([place])    a. CHA2DS2VASc = 3-->coumadin d/c'd 02/2015 2/2 recurrent GIB.  [name] Prostatitis   . Pulmonary hypertension ([place])    a. 10/2014 Echo: EF 60-65%, mild to mod MR, mildly dil LA, nl RV, PASP 61mHg.  .Marland KitchenRenal transplant recipient   . Ulcers of both great toes ([facility]     Past Surgical History:  Procedure Laterality Date  . BACK SURGERY    . ESOPHAGOGASTRODUODENOSCOPY  03/13/15   severe esophagitis, ulcerated mass  . ESOPHAGOGASTRODUODENOSCOPY (EGD) WITH PROPOFOL N/A 07/09/2016   Procedure: ESOPHAGOGASTRODUODENOSCOPY (EGD) WITH PROPOFOL;  Surgeon: KJonathon Bellows MD;  Location: [facility] ENDOSCOPY;  Service: Endoscopy;  Laterality: N/A;  . HERNIA REPAIR  1974  . PERIPHERAL VASCULAR CATHETERIZATION N/A 05/07/2016   Procedure: PGlori LuisCath Insertion;  Surgeon: JAlgernon Huxley MD;  [facility];  Service: Cardiovascular;  Laterality: N/A;  . PROSTATE ABLATION    . STOMACH SURGERY     blood vessel burst  . THROAT SURGERY    . TOTAL KNEE ARTHROPLASTY      Family History  Problem Relation Age of Onset  . Cancer Mother        throat  . Diabetes Brother   . Heart disease Brother   . Stroke Brother   . Hypertension Brother   . Diabetes Sister   . Heart disease Sister   . Hypertension Sister   . Diabetes Sister   . Diabetes Brother   .  COPD Neg Hx   . Kidney disease Neg Hx   . Prostate cancer Neg Hx   . Kidney cancer Neg Hx     Social History:  reports that he quit smoking about 38 years ago. His smoking use included Cigarettes. He has a 25.00 pack-year smoking history. He has never used smokeless tobacco. He reports that he does not drink alcohol or use drugs.  He stopped smoking in 1981.  He smoked 3 cigarettes/day.  He lives in [place].  The patient is accompanied by his wife, [name],  today.  Allergies: No Known Allergies  Current  Medications: Current Outpatient Prescriptions  Medication Sig Dispense Refill  . acetaminophen (TYLENOL) 325 MG tablet Take 2 tablets (650 mg total) by mouth every 6 (six) hours as needed for mild pain (or Fever >/= 101).    [name] albuterol (PROAIR HFA) 108 (90 BASE) MCG/ACT inhaler Inhale 1-2 puffs into the lungs every 4 (four) hours as needed.     [name] allopurinol (ZYLOPRIM) 100 MG tablet Take 100 mg by mouth daily.      [name] COLCRYS 0.6 MG tablet Take 1 tablet by mouth 2 (two) times daily as needed.    . diphenhydrAMINE (BENADRYL) 25 mg capsule Take 1 capsule (25 mg total) by mouth at bedtime as needed for sleep. 30 capsule 0  . feeding supplement, ENSURE ENLIVE, (ENSURE ENLIVE) LIQD Take 237 mLs by mouth 3 (three) times daily between meals. 90 Bottle 0  . finasteride (PROSCAR) 5 MG tablet Take 1 tablet (5 mg total) by mouth daily. 90 tablet 3  . furosemide (LASIX) 20 MG tablet Take 20 mg by mouth every other day. Take 1-2 tablets daily.    . hydroxypropyl methylcellulose (ISOPTO TEARS) 2.5 % ophthalmic solution Place 1 drop into both eyes as needed.     . metoprolol tartrate (LOPRESSOR) 25 MG tablet Take 12.5 mg by mouth 2 (two) times daily.     . Multiple Vitamin (MULTIVITAMIN) tablet Take 1 tablet by mouth daily.      [name] oxybutynin (DITROPAN-XL) 5 MG 24 hr tablet Take 1 tablet (5 mg total) by mouth daily. 90 tablet 3  . oxyCODONE-acetaminophen (ROXICET) 5-325 MG tablet Take 1 tablet by mouth every 6 (six) hours as needed for severe pain. 20 tablet 0  . pantoprazole (PROTONIX) 40 MG tablet TAKE 1 TABLET BY MOUTH TWICE A DAY 60 tablet 1  . pantoprazole (PROTONIX) 40 MG tablet TAKE 1 TABLET BY MOUTH TWICE A DAY 60 tablet 1  . predniSONE (DELTASONE) 20 MG tablet as directed.     . predniSONE (DELTASONE) 5 MG tablet Take 5 mg by mouth daily.     . simethicone (MYLICON) 80 MG chewable tablet Chew 1 tablet (80 mg total) by mouth every 6 (six) hours as needed for flatulence. 120 tablet 0  . sucralfate  (CARAFATE) 1 g tablet Take 1 tablet (1 g total) by mouth 4 (four) times daily. Resume taking after one week- once finished taking oral levaquine. ( to avoid interaction.) 40 tablet 3  . tacrolimus (PROGRAF) 1 MG capsule Take 3 mg by mouth 2 (two) times daily. Reported on 08/16/2015    . tamsulosin (FLOMAX) 0.4 MG CAPS capsule Take 1 capsule (0.4 mg total) by mouth daily. 90 capsule 3  . traZODone (DESYREL) 50 MG tablet 1-2 TABS AS NEEDED FOR SLEEP  3  . ondansetron (ZOFRAN) 4 MG tablet Take 1 tablet (4 mg total) by mouth every 6 (six) hours as needed for nausea. (  Patient not taking: Reported on 01/16/2017) 20 tablet 0   No current facility-administered medications for this visit.    Facility-Administered Medications Ordered in Other Visits  Medication Dose Route Frequency Provider Last Rate Last Dose  . sodium chloride flush (NS) 0.9 % injection 10 mL  10 mL Intracatheter PRN Lequita Asal, MD        Review of Systems:  GENERAL:  Feels "ok".  No fevers or sweats.  Weight down 2 pounds. PERFORMANCE STATUS (ECOG):  1 HEENT:  No visual changes, sore throat, mouth sores or tenderness. Lungs: No shortness of breath or cough.  No hemoptysis. Cardiac:  No chest pain, palpitations, orthopnea, or PND. GI: Eating well.  Interval RUQ pain.  No nausea, vomiting, diarrhea, constipation, melena or hematochezia. GU:  Enlarged prostate.  No urgency, frequency, dysuria, or hematuria. Musculoskeletal:  Right rib pain.  No back pain.  No joint pain.  No muscle tenderness. Extremities:  No pain or swelling. Skin:  Nail changes.  Shoulder bruised.  No rashes or skin changes. Neuro:  No headache, numbness or weakness, balance or coordination issues. Endocrine:  No diabetes, thyroid issues, hot flashes or night sweats. Psych:  No mood changes, depression or anxiety. Pain:  Right ribcage pain. Review of systems:  All other systems reviewed and found to be negative.  Physical Exam: Blood pressure (!)  130/56, pulse (!) 51, temperature (!) 96.5 F (35.8 C), temperature source Tympanic, resp. rate 18, weight 124 lb 6 oz (56.4 kg). GENERAL:  Thin elderly gentleman sitting comfortably in the exam room in no acute distress. He has a cane at his side. MENTAL STATUS:  Alert and oriented to person, place and time. HEAD:  [place].  Temporal wasting.  Normocephalic, atraumatic, face symmetric, no Cushingoid features. EYES:  Glasses.  Brown eyes.  Pupils equal round and reactive to light and accomodation.  No conjunctivitis or scleral icterus. ENT:  Oropharynx clear without lesion.  Edentulous.  Tongue normal. Mucous membranes moist.  RESPIRATORY:  Clear to auscultation without rales, wheezes or rhonchi. CARDIOVASCULAR:  Regular rate and rhythm without murmur, rub or gallop. ABDOMEN:  Soft, non-tender, with active bowel sounds, and no hepatosplenomegaly.  No masses.  Palpable transplanted kidney.  SKIN:  No rashes, ulcers or lesions. EXTREMITIES:  No edema, no skin discoloration or tenderness.  Right upper extremity with dilated vascular s/p AV fistula (chronic).  No palpable cords. NEUROLOGICAL: Unremarkable. PSYCH:  Appropriate.   Imaging studies:  04/28/2016 PET scan:  Bulky intensely hypermetabolic periaortic upper abdominal and mesenteric adenopathy concerning for high-grade lymphoma.  There was hypermetabolic liver metastasis.  There was hpermetabolic lesion involving the small bowel of the upper pelvis.  There were multiple sites of hypermetabolic skeletal metastasis. There was moderate volume right pneumothorax. 08/12/2016 PET scan:  Interval response to therapy. There has been significant decrease in extent of hypermetabolic tumor within the neck, chest, abdomen and pelvis as well as the axial and appendicular skeleton.  There was residual enlarged and hypermetabolic small bowel mesenteric and periaortic lymph nodes. There was a persistent hypermetabolic focus of increased uptake within the  spleen.  There was resolution of previous multifocal hypermetabolic lesions within the liver. 10/10/2016 PET scan:  Slight worsening compared to the prior study. Several lesions were slightly more hypermetabolic and there appeared to be a newly enlarged and hypermetabolic mesenteric node adjacent to one of the previous mesenteric lymph nodes. However, there are no new hypermetabolic lesions in the neck, chest, or skeleton.  The  small hypermetabolic peripheral lesion of the right kidney upper pole is slightly less hypermetabolic. 96/75/9163 PET scan:  Marked progression of disease as evidenced by progressive bulky mesenteric adenopathy, new low left internal jugular/left juxta diaphragmatic/abdominal retroperitoneal adenopathy, new pulmonary nodules, new hepatic and splenic lesions and new peritoneal nodules, all of which are hypermetabolic   Clinical Support on 02/03/2017  Component Date Value Ref Range Status  . Uric Acid, Serum 02/03/2017 6.5  4.4 - 7.6 mg/dL Final  . WBC 02/03/2017 2.7* 3.8 - 10.6 K/uL Final  . RBC 02/03/2017 3.64* 4.40 - 5.90 MIL/uL Final  . Hemoglobin 02/03/2017 9.9* 13.0 - 18.0 g/dL Final  . HCT 02/03/2017 29.2* 40.0 - 52.0 % Final  . MCV 02/03/2017 80.3  80.0 - 100.0 fL Final  . MCH 02/03/2017 27.3  26.0 - 34.0 pg Final  . MCHC 02/03/2017 34.0  32.0 - 36.0 g/dL Final  . RDW 02/03/2017 21.0* 11.5 - 14.5 % Final  . Platelets 02/03/2017 135* 150 - 440 K/uL Final  . Neutrophils Relative % 02/03/2017 60  % Final  . Lymphocytes Relative 02/03/2017 18  % Final  . Monocytes Relative 02/03/2017 18  % Final  . Eosinophils Relative 02/03/2017 3  % Final  . Basophils Relative 02/03/2017 1  % Final  . Neutro Abs 02/03/2017 1.6  1.4 - 6.5 K/uL Final  . Lymphs Abs 02/03/2017 0.5* 1.0 - 3.6 K/uL Final  . Monocytes Absolute 02/03/2017 0.5  0.2 - 1.0 K/uL Final  . Eosinophils Absolute 02/03/2017 0.1  0 - 0.7 K/uL Final  . Basophils Absolute 02/03/2017 0.0  0 - 0.1 K/uL Final  . RBC  Morphology 02/03/2017 MIXED RBC POPULATION   Final  . WBC Morphology 02/03/2017 TOO FEW TO COUNT, SMEAR AVAILABLE FOR REVIEW   Final  . Smear Review 02/03/2017 SMEAR SCANNED   Final   PLATELETS VARY IN SIZE  . Sodium 02/03/2017 140  135 - 145 mmol/L Final  . Potassium 02/03/2017 4.3  3.5 - 5.1 mmol/L Final  . Chloride 02/03/2017 105  101 - 111 mmol/L Final  . CO2 02/03/2017 28  22 - 32 mmol/L Final  . Glucose, Bld 02/03/2017 105* 65 - 99 mg/dL Final  . BUN 02/03/2017 26* 6 - 20 mg/dL Final  . Creatinine, Ser 02/03/2017 1.27* 0.61 - 1.24 mg/dL Final  . Calcium 02/03/2017 10.9* 8.9 - 10.3 mg/dL Final  . Total Protein 02/03/2017 6.7  6.5 - 8.1 g/dL Final  . Albumin 02/03/2017 3.9  3.5 - 5.0 g/dL Final  . AST 02/03/2017 32  15 - 41 U/L Final  . ALT 02/03/2017 13* 17 - 63 U/L Final  . Alkaline Phosphatase 02/03/2017 90  38 - 126 U/L Final  . Total Bilirubin 02/03/2017 0.7  0.3 - 1.2 mg/dL Final  . GFR calc non Af Amer 02/03/2017 53* >60 mL/min Final  . GFR calc Af Amer 02/03/2017 >60  >60 mL/min Final   Comment: (NOTE) The eGFR has been calculated using the CKD EPI equation. This calculation has not been validated in all clinical situations. eGFR's persistently <60 mL/min signify possible Chronic Kidney Disease.   . Anion gap 02/03/2017 7  5 - 15 Final  . Hepatitis B Surface Ag 02/03/2017 Negative  Negative Final   Comment: (NOTE) Performed At: [facility] [place], [place] [id number] Lindon Romp MD [id number]   . Hep B Core Total Ab 02/03/2017 Negative  Negative Final   Comment: (NOTE) Performed At: [facility] [address] [place], [place] [id number] Lindon Romp MD NF:[id number]   . HCV Ab 02/03/2017 <0.1  0.0 - 0.9 s/co ratio Final   Comment: (NOTE)                                  Negative:     < 0.8                             Indeterminate: 0.8 - 0.9                                  Positive:     > 0.9 The CDC recommends that  a positive HCV antibody result be followed up with a HCV Nucleic Acid Amplification test (469629). Performed At: [facility] [address] [place], [place] [id number] Lindon Romp MD WN:[id number]     Assessment:  Matthew Brown is a 76 y.o. male s/p renal transplant (2007) with a post-transplant lymphoproliferative disorder, stage IV diffuse large B cell lymphoma.  He presented with a 2-3 month history of progressive back pain superimposed on chronic back pain.    PET scan on 04/28/2016 revealed bulky intensely hypermetabolic periaortic upper abdominal and mesenteric adenopathy concerning for high-grade lymphoma.  There was hypermetabolic liver metastasis.  There was hpermetabolic lesion involving the small bowel of the upper pelvis.  There were multiple sites of hypermetabolic skeletal metastasis. There was moderate volume right pneumothorax.  CT guided retroperitoneal node biopsy on 04/30/2016 revealed diffuse large B cell lymphoma.  Hepatitis B and C testing on 05/06/2016 were negative.  Echo on 05/06/2016 revealed an EF of 55-60%.  Echo on 09/01/2016 revealed an EF of 60-65%.  Bone marrow aspirate and biopsy on 05/08/2016 revealed multifocal marrow involvement by diffuse large B-cell lymphoma. There was variably cellular marrow for age (50% - 90%) with a patchy predominantly nodular large B-cell infiltrate, overall estimated to account for 20% of the core biopsy. There was adequate residual trilineage hematopoiesis with mild nonspecific dyserythropoiesis. There was patchy mild increase in reticulin. Storage iron was present. The immunohistochemical staining pattern of the B-cell infiltrate (CD10 +/-, BCL 6+, BCL-2 +) suggested possible large cell transformation of follicular lymphoma.  Flow cytometry revealed no significant immunophenotypic abnormalities or evidence of B-cell lymphoma.  He has bone metastasis and hypercalcemia.  Calcium was 11.2 (ionized 7.3) on 05/13/2016.  He  received Zometa on 05/13/2016.  He began [place] on 06/09/2016 (last 08/05/2016).  Work-up on 04/15/2016 revealed the following normal studies: ferritin (343), iron saturation (6%), TIBC (241; low), B12 (525), folate (27).  Reticulocyte count was 2%.  LDH was 409.  Uric acid was 7.7 (4.4 - 7.6).  He was admitted at [facility] from 04/28/2016 - 04/30/2016 with a moderate volume right sided pneumothorax.  His pneumothorax improved spontaneously. Plain films of the right femur revealed the lucent bone lesion in the right femoral neck  was poorly characterized.  There was no evidence for an acute fracture.  PTH was 106 (high) 04/30/2016 with a calcium of 11.2.  Etiology was c/w primary hyperparathyroidism.  PTH-related polypeptide was < 1.1 on 04/30/2016.  He has a history of GI bleeding in 08/2014.  He underwent tagged RBC scan which revealed an active bleed in the hepatic flexure.  He was embolized in vascular interventional radiology.  He was admitted to [facility] from 07/08/2016 - 07/13/2016 with GI bleeding.  EGD on 07/09/2016 revealed multiple non-bleeding duodenal ulcer as well as duodenitis. Tagged RBC scan on 07/11/2016 revealed active GI bleed in the lateral right mid abdomen coursing through multiple curvilinear bowel loops in the right mid abdomen favoring a small bowel source of bleeding.  He received 5 units of PRBCs, 2 units of pheresed platelets, and vitamin K from 07/07/2016 0 07/13/2016.  He was transferred to [facility] from 07/13/2016 - 07/16/2016.  [facility] performed a colonoscopy and push enteroscopy which showed several diverticula and old blood but no source of bleeding. Source of bleed was presumed to be diverticular, although it was possible a small bowel site was not visualized on endoscopy.  He has a history of renal failure s/p renal transplant.  He is tacrolimus (Prograf), and steroids  Creatinine has ranged between 1.43 - 1.93 in the past 6 months. Mycophenolate (MMF) was discontinued at diagnosis.   Prograf level was 5.5 (3.0-8.0) on 05/08/2016 and 2.2 on 08/13/2016.   He received 6 cycles of mini-RCHOP (05/09/2016 - 09/05/2016).  Cycle #1 was complicated by fever and neutropenia.  All cultures were negative.  He was treated empirically with Cefepime.  Peripheral smear revealed schistocytes.  Cycle #3 was complicated by a GI bleed.  PET scan on 10/10/2016 revealed slight worsening compared to the prior study. Several lesions were slightly more hypermetabolic and there appeared to be a newly enlarged and hypermetabolic mesenteric node adjacent to one of the previous mesenteric lymph nodes. However, there are no new hypermetabolic lesions in the neck, chest, or skeleton.  The small hypermetabolic peripheral lesion of the right kidney upper pole is slightly less hypermetabolic.  He was lost to follow-up.  PET scan on 01/14/2017 revealed marked progression of disease as evidenced by progressive bulky mesenteric adenopathy, new low left internal jugular/left juxta diaphragmatic/abdominal retroperitoneal adenopathy, new pulmonary nodules, new hepatic and splenic lesions and new peritoneal nodules, all of which are hypermetabolic  CSF on 03/70/4888 revealed 7 WBCs (4% segs, 79% lymphs, and 17% monocytes).  He has undergone LP with IT MTX x 4 (07/24/2016, 08/14/2016, 09/04/2016, and 10/02/2016).  Cytology was negative on 07/24/2016 and 09/04/2016.  Symptomatically, he denies any B symptoms.  He is eating well.  Weight is down.  Exam reveals no adenopathy.  Plan: 1.  Labs today: CBC with diff, CMP, LDH, uric acid, hepatitis B core antibody, hepatitis B surface antigen, and hepatitis C antibody. 2.  Discuss chemotherapy plan with gemcitabine and oxaliplatin + obinutuzumab.  Side effects reviewed.  Discuss potential tumor lysis syndrome.  Discuss fluids and allopurinol.  Treatment starts tomorrow. Discuss split dose obintuzumab with first cycle.  Treatment is every 2 weeks. 3.  Discuss slightly elevated  calcium.  Discuss consideration of Zometa if cleared by transplant nephrologist. 4.  Preauth Zometa. 5.  RTC 08/15 for gemcitabine + oxaliplatin + obinutuzumab. 6.  RTC on 08/16 for obinutuzumab. 7.  RTC on 08/17 for labs (BMP, uric acid). 8.  RTC in 1 week for MD assessment and labs (CBC with diff, BMP, uric acid, hold tube).   Lequita Asal, MD  02/03/2017

## 2017-02-03 NOTE — Progress Notes (Signed)
Patient here today to discuss with MD regarding new chemotherapy treatment.  Offers no complaints today.

## 2017-02-04 ENCOUNTER — Inpatient Hospital Stay: Payer: Medicare HMO

## 2017-02-04 ENCOUNTER — Other Ambulatory Visit: Payer: Self-pay | Admitting: Hematology and Oncology

## 2017-02-04 ENCOUNTER — Other Ambulatory Visit: Payer: Self-pay | Admitting: *Deleted

## 2017-02-04 VITALS — BP 107/49 | HR 52 | Temp 97.2°F | Resp 18

## 2017-02-04 DIAGNOSIS — C8339 Diffuse large B-cell lymphoma, extranodal and solid organ sites: Secondary | ICD-10-CM

## 2017-02-04 DIAGNOSIS — C8330 Diffuse large B-cell lymphoma, unspecified site: Secondary | ICD-10-CM

## 2017-02-04 DIAGNOSIS — Z5111 Encounter for antineoplastic chemotherapy: Secondary | ICD-10-CM | POA: Diagnosis not present

## 2017-02-04 LAB — HEPATITIS B CORE ANTIBODY, TOTAL: Hep B Core Total Ab: NEGATIVE

## 2017-02-04 LAB — HEPATITIS B SURFACE ANTIGEN: Hepatitis B Surface Ag: NEGATIVE

## 2017-02-04 MED ORDER — SODIUM CHLORIDE 0.9 % IV SOLN
Freq: Once | INTRAVENOUS | Status: AC
  Administered 2017-02-04: 08:00:00 via INTRAVENOUS
  Filled 2017-02-04: qty 1000

## 2017-02-04 MED ORDER — DEXTROSE 5 % IV SOLN
Freq: Once | INTRAVENOUS | Status: AC
  Administered 2017-02-04: 14:00:00 via INTRAVENOUS
  Filled 2017-02-04: qty 1000

## 2017-02-04 MED ORDER — OXALIPLATIN CHEMO INJECTION 100 MG/20ML
80.0000 mg/m2 | Freq: Once | INTRAVENOUS | Status: AC
  Administered 2017-02-04: 130 mg via INTRAVENOUS
  Filled 2017-02-04: qty 26

## 2017-02-04 MED ORDER — PALONOSETRON HCL INJECTION 0.25 MG/5ML
0.2500 mg | Freq: Once | INTRAVENOUS | Status: AC
  Administered 2017-02-04: 0.25 mg via INTRAVENOUS
  Filled 2017-02-04: qty 5

## 2017-02-04 MED ORDER — HEPARIN SOD (PORK) LOCK FLUSH 100 UNIT/ML IV SOLN
500.0000 [IU] | Freq: Once | INTRAVENOUS | Status: AC | PRN
  Administered 2017-02-04: 500 [IU]

## 2017-02-04 MED ORDER — SODIUM CHLORIDE 0.9% FLUSH
10.0000 mL | INTRAVENOUS | Status: DC | PRN
  Administered 2017-02-04: 10 mL
  Filled 2017-02-04: qty 10

## 2017-02-04 MED ORDER — DEXAMETHASONE SODIUM PHOSPHATE 100 MG/10ML IJ SOLN
20.0000 mg | Freq: Once | INTRAMUSCULAR | Status: AC
  Administered 2017-02-04: 20 mg via INTRAVENOUS
  Filled 2017-02-04: qty 2

## 2017-02-04 MED ORDER — DIPHENHYDRAMINE HCL 50 MG/ML IJ SOLN
50.0000 mg | Freq: Once | INTRAMUSCULAR | Status: AC
  Administered 2017-02-04: 50 mg via INTRAVENOUS
  Filled 2017-02-04: qty 1

## 2017-02-04 MED ORDER — ACETAMINOPHEN 325 MG PO TABS
650.0000 mg | ORAL_TABLET | Freq: Once | ORAL | Status: AC
  Administered 2017-02-04: 650 mg via ORAL
  Filled 2017-02-04: qty 2

## 2017-02-04 MED ORDER — PEGFILGRASTIM 6 MG/0.6ML ~~LOC~~ PSKT: 6.0000 mg | PREFILLED_SYRINGE | Freq: Once | SUBCUTANEOUS | Status: DC

## 2017-02-04 MED ORDER — SODIUM CHLORIDE 0.9 % IV SOLN
1600.0000 mg | Freq: Once | INTRAVENOUS | Status: AC
  Administered 2017-02-04: 1600 mg via INTRAVENOUS
  Filled 2017-02-04: qty 26.3

## 2017-02-04 MED ORDER — OBINUTUZUMAB CHEMO INJECTION 1000 MG/40ML
100.0000 mg | Freq: Once | INTRAVENOUS | Status: AC
  Administered 2017-02-04: 100 mg via INTRAVENOUS
  Filled 2017-02-04: qty 4

## 2017-02-05 ENCOUNTER — Inpatient Hospital Stay: Payer: Medicare HMO

## 2017-02-05 ENCOUNTER — Other Ambulatory Visit: Payer: Self-pay | Admitting: *Deleted

## 2017-02-05 ENCOUNTER — Inpatient Hospital Stay: Payer: Medicare HMO | Admitting: Hematology and Oncology

## 2017-02-05 VITALS — BP 117/48 | HR 52 | Temp 97.2°F | Resp 18

## 2017-02-05 DIAGNOSIS — C8330 Diffuse large B-cell lymphoma, unspecified site: Secondary | ICD-10-CM

## 2017-02-05 DIAGNOSIS — Z5111 Encounter for antineoplastic chemotherapy: Secondary | ICD-10-CM | POA: Diagnosis not present

## 2017-02-05 DIAGNOSIS — C8339 Diffuse large B-cell lymphoma, extranodal and solid organ sites: Secondary | ICD-10-CM

## 2017-02-05 MED ORDER — PEGFILGRASTIM 6 MG/0.6ML ~~LOC~~ PSKT
6.0000 mg | PREFILLED_SYRINGE | Freq: Once | SUBCUTANEOUS | Status: AC
  Administered 2017-02-05: 6 mg via SUBCUTANEOUS
  Filled 2017-02-05: qty 0.6

## 2017-02-05 MED ORDER — HEPARIN SOD (PORK) LOCK FLUSH 100 UNIT/ML IV SOLN
500.0000 [IU] | Freq: Once | INTRAVENOUS | Status: AC | PRN
  Administered 2017-02-05: 500 [IU]

## 2017-02-05 MED ORDER — OBINUTUZUMAB CHEMO INJECTION 1000 MG/40ML
900.0000 mg | Freq: Once | INTRAVENOUS | Status: AC
  Administered 2017-02-05: 900 mg via INTRAVENOUS
  Filled 2017-02-05: qty 36

## 2017-02-05 MED ORDER — SODIUM CHLORIDE 0.9 % IV SOLN
20.0000 mg | Freq: Once | INTRAVENOUS | Status: AC
  Administered 2017-02-05: 20 mg via INTRAVENOUS
  Filled 2017-02-05: qty 2

## 2017-02-05 MED ORDER — DIPHENHYDRAMINE HCL 50 MG/ML IJ SOLN
50.0000 mg | Freq: Once | INTRAMUSCULAR | Status: AC
  Administered 2017-02-05: 50 mg via INTRAVENOUS

## 2017-02-05 MED ORDER — SODIUM CHLORIDE 0.9 % IV SOLN
Freq: Once | INTRAVENOUS | Status: AC
  Administered 2017-02-05: 10:00:00 via INTRAVENOUS
  Filled 2017-02-05: qty 1000

## 2017-02-05 MED ORDER — ACETAMINOPHEN 325 MG PO TABS
650.0000 mg | ORAL_TABLET | Freq: Once | ORAL | Status: AC
  Administered 2017-02-05: 650 mg via ORAL

## 2017-02-05 MED ORDER — SODIUM CHLORIDE 0.9% FLUSH
10.0000 mL | INTRAVENOUS | Status: DC | PRN
  Filled 2017-02-05: qty 10

## 2017-02-05 NOTE — Progress Notes (Signed)
Nutrition Follow-up:  Patient seen in infusion with wife.  Patient has post-transplant lymphoproliferative disorder, stage IV diffuse large B cell lymphoma. Noted patient is not transplant candidate.    Patient reports good appetite but wife reports sometimes he eats good and other times not so good.  Reports that breakfast is his best meal (eggs, toast, grit/oatmeal, sausage sometimes.  Further discussion about the rest of the day's intake was attempted but patient unable to tell me much about what he is eating.  Wife reports likes ensure but thought it was causing diarrhea so stopped. Wife reports that she makes him a milkshake sometimes.    Medications: reviewed  Labs: glucose 105, BUN 26, creatinine 1.27, calcium 10.9  Anthropometrics:   Weight noted on 124 lb 6 oz on 8/14, stable since 08/04/16 (125lb)   NUTRITION DIAGNOSIS: Malnutrition continues   MALNUTRITION DIAGNOSIS: Severe malnutrition continues   INTERVENTION:   Encouraged small frequent meals for patient. Discussed high calorie, high protein foods to add to current intake. Patient with questions about dry mouth.  Fact sheet discussed and given to patient Wife asking about samples of ensure/boost as she wants to try these with patient again.  Samples and coupons given.  Also gave samples of unjury with recipes.   Contact information given to patient    MONITORING, EVALUATION, GOAL: Patient will consume adequate calories and protein to maintain weight and/or promote weight gain   NEXT VISIT: as needed  Ayasha Ellingsen B. Zenia Resides, [place], [place] Registered Dietitian [phone] (pager)

## 2017-02-06 ENCOUNTER — Inpatient Hospital Stay: Payer: Medicare HMO

## 2017-02-06 ENCOUNTER — Telehealth: Payer: Self-pay | Admitting: *Deleted

## 2017-02-06 ENCOUNTER — Other Ambulatory Visit: Payer: Self-pay | Admitting: *Deleted

## 2017-02-06 DIAGNOSIS — C8333 Diffuse large B-cell lymphoma, intra-abdominal lymph nodes: Secondary | ICD-10-CM

## 2017-02-06 DIAGNOSIS — Z5111 Encounter for antineoplastic chemotherapy: Secondary | ICD-10-CM | POA: Diagnosis not present

## 2017-02-06 DIAGNOSIS — C787 Secondary malignant neoplasm of liver and intrahepatic bile duct: Secondary | ICD-10-CM

## 2017-02-06 DIAGNOSIS — C7800 Secondary malignant neoplasm of unspecified lung: Secondary | ICD-10-CM

## 2017-02-06 DIAGNOSIS — Z5112 Encounter for antineoplastic immunotherapy: Secondary | ICD-10-CM

## 2017-02-06 DIAGNOSIS — C7951 Secondary malignant neoplasm of bone: Secondary | ICD-10-CM

## 2017-02-06 DIAGNOSIS — C7889 Secondary malignant neoplasm of other digestive organs: Secondary | ICD-10-CM

## 2017-02-06 DIAGNOSIS — C8330 Diffuse large B-cell lymphoma, unspecified site: Secondary | ICD-10-CM

## 2017-02-06 LAB — CBC WITH DIFFERENTIAL/PLATELET
Basophils Absolute: 0 10*3/uL (ref 0–0.1)
Basophils Relative: 0 %
Eosinophils Absolute: 0 10*3/uL (ref 0–0.7)
Eosinophils Relative: 0 %
HCT: 27.5 % — ABNORMAL LOW (ref 40.0–52.0)
Hemoglobin: 9.3 g/dL — ABNORMAL LOW (ref 13.0–18.0)
Lymphocytes Relative: 12 %
Lymphs Abs: 0.3 10*3/uL — ABNORMAL LOW (ref 1.0–3.6)
MCH: 27.1 pg (ref 26.0–34.0)
MCHC: 33.6 g/dL (ref 32.0–36.0)
MCV: 80.7 fL (ref 80.0–100.0)
Monocytes Absolute: 0.1 10*3/uL — ABNORMAL LOW (ref 0.2–1.0)
Monocytes Relative: 6 %
Neutro Abs: 1.9 10*3/uL (ref 1.4–6.5)
Neutrophils Relative %: 82 %
Platelets: 95 10*3/uL — ABNORMAL LOW (ref 150–440)
RBC: 3.41 MIL/uL — ABNORMAL LOW (ref 4.40–5.90)
RDW: 20.7 % — ABNORMAL HIGH (ref 11.5–14.5)
Smear Review: DECREASED
WBC: 2.3 10*3/uL — ABNORMAL LOW (ref 3.8–10.6)

## 2017-02-06 LAB — BASIC METABOLIC PANEL
Anion gap: 6 (ref 5–15)
BUN: 30 mg/dL — ABNORMAL HIGH (ref 6–20)
CO2: 25 mmol/L (ref 22–32)
Calcium: 9.8 mg/dL (ref 8.9–10.3)
Chloride: 106 mmol/L (ref 101–111)
Creatinine, Ser: 1.34 mg/dL — ABNORMAL HIGH (ref 0.61–1.24)
GFR calc Af Amer: 58 mL/min — ABNORMAL LOW (ref >60)
GFR calc non Af Amer: 50 mL/min — ABNORMAL LOW (ref >60)
Glucose, Bld: 109 mg/dL — ABNORMAL HIGH (ref 65–99)
Potassium: 5.4 mmol/L — ABNORMAL HIGH (ref 3.5–5.1)
Sodium: 137 mmol/L (ref 135–145)

## 2017-02-06 LAB — POTASSIUM: Potassium: 5.1 mmol/L (ref 3.5–5.1)

## 2017-02-06 LAB — LACTATE DEHYDROGENASE: LDH: 480 U/L — ABNORMAL HIGH (ref 98–192)

## 2017-02-06 LAB — HEPATITIS C ANTIBODY: HCV Ab: <0.1 s/co ratio (ref 0.0–0.9)

## 2017-02-06 LAB — URIC ACID: Uric Acid, Serum: 7.5 mg/dL (ref 4.4–7.6)

## 2017-02-06 NOTE — Telephone Encounter (Signed)
Called patients wife and discussed elevated potassium, pt and wife willing to come back today for recheck at 230.  Voiced understanding.

## 2017-02-09 ENCOUNTER — Telehealth: Payer: Self-pay | Admitting: *Deleted

## 2017-02-09 NOTE — Telephone Encounter (Signed)
Called wife and informed her that his potassium level was wnl, voiced understanding

## 2017-02-10 ENCOUNTER — Inpatient Hospital Stay (HOSPITAL_BASED_OUTPATIENT_CLINIC_OR_DEPARTMENT_OTHER): Payer: Medicare HMO | Admitting: Hematology and Oncology

## 2017-02-10 ENCOUNTER — Inpatient Hospital Stay: Payer: Medicare HMO

## 2017-02-10 VITALS — BP 130/50 | HR 56 | Temp 97.7°F | Resp 18 | Wt 126.1 lb

## 2017-02-10 DIAGNOSIS — E210 Primary hyperparathyroidism: Secondary | ICD-10-CM | POA: Diagnosis not present

## 2017-02-10 DIAGNOSIS — N186 End stage renal disease: Secondary | ICD-10-CM | POA: Diagnosis not present

## 2017-02-10 DIAGNOSIS — Z5112 Encounter for antineoplastic immunotherapy: Secondary | ICD-10-CM

## 2017-02-10 DIAGNOSIS — Z5111 Encounter for antineoplastic chemotherapy: Secondary | ICD-10-CM | POA: Diagnosis not present

## 2017-02-10 DIAGNOSIS — D696 Thrombocytopenia, unspecified: Secondary | ICD-10-CM

## 2017-02-10 DIAGNOSIS — C7800 Secondary malignant neoplasm of unspecified lung: Secondary | ICD-10-CM

## 2017-02-10 DIAGNOSIS — Z8619 Personal history of other infectious and parasitic diseases: Secondary | ICD-10-CM

## 2017-02-10 DIAGNOSIS — C7889 Secondary malignant neoplasm of other digestive organs: Secondary | ICD-10-CM

## 2017-02-10 DIAGNOSIS — C8333 Diffuse large B-cell lymphoma, intra-abdominal lymph nodes: Secondary | ICD-10-CM

## 2017-02-10 DIAGNOSIS — D701 Agranulocytosis secondary to cancer chemotherapy: Secondary | ICD-10-CM

## 2017-02-10 DIAGNOSIS — Z87891 Personal history of nicotine dependence: Secondary | ICD-10-CM

## 2017-02-10 DIAGNOSIS — N400 Enlarged prostate without lower urinary tract symptoms: Secondary | ICD-10-CM | POA: Diagnosis not present

## 2017-02-10 DIAGNOSIS — E785 Hyperlipidemia, unspecified: Secondary | ICD-10-CM

## 2017-02-10 DIAGNOSIS — I272 Pulmonary hypertension, unspecified: Secondary | ICD-10-CM

## 2017-02-10 DIAGNOSIS — M109 Gout, unspecified: Secondary | ICD-10-CM

## 2017-02-10 DIAGNOSIS — Z800 Family history of malignant neoplasm of digestive organs: Secondary | ICD-10-CM

## 2017-02-10 DIAGNOSIS — C787 Secondary malignant neoplasm of liver and intrahepatic bile duct: Secondary | ICD-10-CM

## 2017-02-10 DIAGNOSIS — T451X5A Adverse effect of antineoplastic and immunosuppressive drugs, initial encounter: Secondary | ICD-10-CM

## 2017-02-10 DIAGNOSIS — Z8719 Personal history of other diseases of the digestive system: Secondary | ICD-10-CM

## 2017-02-10 DIAGNOSIS — D47Z1 Post-transplant lymphoproliferative disorder (PTLD): Secondary | ICD-10-CM

## 2017-02-10 DIAGNOSIS — I129 Hypertensive chronic kidney disease with stage 1 through stage 4 chronic kidney disease, or unspecified chronic kidney disease: Secondary | ICD-10-CM

## 2017-02-10 DIAGNOSIS — D649 Anemia, unspecified: Secondary | ICD-10-CM

## 2017-02-10 DIAGNOSIS — E042 Nontoxic multinodular goiter: Secondary | ICD-10-CM

## 2017-02-10 DIAGNOSIS — Z7952 Long term (current) use of systemic steroids: Secondary | ICD-10-CM

## 2017-02-10 DIAGNOSIS — K219 Gastro-esophageal reflux disease without esophagitis: Secondary | ICD-10-CM | POA: Diagnosis not present

## 2017-02-10 DIAGNOSIS — R1012 Left upper quadrant pain: Secondary | ICD-10-CM | POA: Diagnosis not present

## 2017-02-10 DIAGNOSIS — I4891 Unspecified atrial fibrillation: Secondary | ICD-10-CM

## 2017-02-10 DIAGNOSIS — Z79899 Other long term (current) drug therapy: Secondary | ICD-10-CM

## 2017-02-10 DIAGNOSIS — M1990 Unspecified osteoarthritis, unspecified site: Secondary | ICD-10-CM

## 2017-02-10 DIAGNOSIS — R51 Headache: Secondary | ICD-10-CM

## 2017-02-10 DIAGNOSIS — Z940 Kidney transplant status: Secondary | ICD-10-CM

## 2017-02-10 DIAGNOSIS — C7951 Secondary malignant neoplasm of bone: Secondary | ICD-10-CM

## 2017-02-10 LAB — CBC WITH DIFFERENTIAL/PLATELET
Basophils Absolute: 0 10*3/uL (ref 0–0.1)
Basophils Relative: 1 %
Eosinophils Absolute: 0 10*3/uL (ref 0–0.7)
Eosinophils Relative: 2 %
HCT: 26.3 % — ABNORMAL LOW (ref 40.0–52.0)
Hemoglobin: 8.7 g/dL — ABNORMAL LOW (ref 13.0–18.0)
Lymphocytes Relative: 30 %
Lymphs Abs: 0.3 10*3/uL — ABNORMAL LOW (ref 1.0–3.6)
MCH: 26.7 pg (ref 26.0–34.0)
MCHC: 33.3 g/dL (ref 32.0–36.0)
MCV: 80.4 fL (ref 80.0–100.0)
Monocytes Absolute: 0.1 10*3/uL — ABNORMAL LOW (ref 0.2–1.0)
Monocytes Relative: 6 %
Neutro Abs: 0.7 10*3/uL — ABNORMAL LOW (ref 1.4–6.5)
Neutrophils Relative %: 61 %
Platelets: 58 10*3/uL — ABNORMAL LOW (ref 150–440)
RBC: 3.27 MIL/uL — ABNORMAL LOW (ref 4.40–5.90)
RDW: 20.5 % — ABNORMAL HIGH (ref 11.5–14.5)
WBC: 1.1 10*3/uL — CL (ref 3.8–10.6)

## 2017-02-10 LAB — BASIC METABOLIC PANEL
Anion gap: 4 — ABNORMAL LOW (ref 5–15)
BUN: 19 mg/dL (ref 6–20)
CO2: 28 mmol/L (ref 22–32)
Calcium: 10.6 mg/dL — ABNORMAL HIGH (ref 8.9–10.3)
Chloride: 105 mmol/L (ref 101–111)
Creatinine, Ser: 1.17 mg/dL (ref 0.61–1.24)
GFR calc Af Amer: >60 mL/min (ref >60)
GFR calc non Af Amer: 59 mL/min — ABNORMAL LOW (ref >60)
Glucose, Bld: 99 mg/dL (ref 65–99)
Potassium: 5.3 mmol/L — ABNORMAL HIGH (ref 3.5–5.1)
Sodium: 137 mmol/L (ref 135–145)

## 2017-02-10 LAB — URIC ACID: Uric Acid, Serum: 6.7 mg/dL (ref 4.4–7.6)

## 2017-02-10 NOTE — Progress Notes (Signed)
Patient offers no complaints today. 

## 2017-02-10 NOTE — Progress Notes (Signed)
[facility] day:  02/10/2017   Chief Complaint: Matthew Brown is a 76 y.o. male with post-transplant lymphoproliferative disorder, stage IVBE diffuse large B cell lymphoma, who is seen for nadir assessment on day 7 of cycle #1 gemcitabine and oxaliplatin + obinutuzumab.  HPI:  The patient was last seen in the medical oncology clinic on 02/03/2017.  At that time, he noted transient LUQ pain.  He denied any nausea or vomiting.  He was eating, but losing weight.  He denied any bruising or bleeding.  He received day 1 of cycle #1 gemcitabine and oxaliplatin on 02/04/2017.  He completed obinutuzumab on 02/05/2017.  He received OnPro Neulsta on 02/05/2017.  Labs on 02/06/2017 revealed a potassium of 5.4 (repeat 5.1), uric acid 7.5, calcium 9.8, and creatinine 1.34.  During the interim, he has been doing well.  He denies nausea, vomiting, diarrhea, joint pains, or recent infections. Diet is reported to be good.  He has had a 2 pound weight gain since last visit. He denies numbness and tingling in his fingers and toes. He has no reported cold sensitivities.    Past Medical History:  Diagnosis Date  . Benign prostatic hypertrophy   . Chronic headache 10/19/2015  . ED (erectile dysfunction)   . End stage renal disease ([place])   . Essential hypertension   . GERD (gastroesophageal reflux disease)   . GIB (gastrointestinal bleeding)    a. 01/8415 s/p R colic artery embolization;  b. 02/2015 EGD: duod ulcerative mass->Bx notable for coagulative necrosis - ? ischemia vs thrombosis-->coumadin d/c'd.  . Gout   . Hearing loss   . Hemorrhoids   . Hyperlipidemia   . Lymphoma ([place])   . Lymphoma ([place]) 2017  . Multiple thyroid nodules 06/06/2016   Noted on carotid US; dedicated US to be ordered by staff  . Osteoarthrosis, unspecified whether generalized or localized, lower leg   . Persistent atrial fibrillation ([place])    a. CHA2DS2VASc = 3-->coumadin d/c'd 02/2015 2/2  recurrent GIB.  [name] Prostatitis   . Pulmonary hypertension ([place])    a. 10/2014 Echo: EF 60-65%, mild to mod MR, mildly dil LA, nl RV, PASP 68mHg.  .Marland KitchenRenal transplant recipient   . Ulcers of both great toes ([facility]     Past Surgical History:  Procedure Laterality Date  . BACK SURGERY    . ESOPHAGOGASTRODUODENOSCOPY  03/13/15   severe esophagitis, ulcerated mass  . ESOPHAGOGASTRODUODENOSCOPY (EGD) WITH PROPOFOL N/A 07/09/2016   Procedure: ESOPHAGOGASTRODUODENOSCOPY (EGD) WITH PROPOFOL;  Surgeon: KJonathon Bellows MD;  Location: [facility] ENDOSCOPY;  Service: Endoscopy;  Laterality: N/A;  . HERNIA REPAIR  1974  . PERIPHERAL VASCULAR CATHETERIZATION N/A 05/07/2016   Procedure: PGlori LuisCath Insertion;  Surgeon: JAlgernon Huxley MD;  [facility];  Service: Cardiovascular;  Laterality: N/A;  . PROSTATE ABLATION    . STOMACH SURGERY     blood vessel burst  . THROAT SURGERY    . TOTAL KNEE ARTHROPLASTY      Family History  Problem Relation Age of Onset  . Cancer Mother        throat  . Diabetes Brother   . Heart disease Brother   . Stroke Brother   . Hypertension Brother   . Diabetes Sister   . Heart disease Sister   . Hypertension Sister   . Diabetes Sister   . Diabetes Brother   . COPD Neg Hx   . Kidney disease Neg Hx   .  Prostate cancer Neg Hx   . Kidney cancer Neg Hx     Social History:  reports that he quit smoking about 38 years ago. His smoking use included Cigarettes. He has a 25.00 pack-year smoking history. He has never used smokeless tobacco. He reports that he does not drink alcohol or use drugs.  He stopped smoking in 1981.  He smoked 3 cigarettes/day.  He lives in [place].  The patient is accompanied by his wife, Matthew Brown,  today.  Allergies: No Known Allergies  Current Medications: Current Outpatient Prescriptions  Medication Sig Dispense Refill  . acetaminophen (TYLENOL) 325 MG tablet Take 2 tablets (650 mg total) by mouth every 6 (six) hours as needed for mild  pain (or Fever >/= 101).    [name] albuterol (PROAIR HFA) 108 (90 BASE) MCG/ACT inhaler Inhale 1-2 puffs into the lungs every 4 (four) hours as needed.     [name] allopurinol (ZYLOPRIM) 100 MG tablet Take 100 mg by mouth daily.      [name] COLCRYS 0.6 MG tablet Take 1 tablet by mouth 2 (two) times daily as needed.    . diphenhydrAMINE (BENADRYL) 25 mg capsule Take 1 capsule (25 mg total) by mouth at bedtime as needed for sleep. 30 capsule 0  . feeding supplement, ENSURE ENLIVE, (ENSURE ENLIVE) LIQD Take 237 mLs by mouth 3 (three) times daily between meals. 90 Bottle 0  . finasteride (PROSCAR) 5 MG tablet Take 1 tablet (5 mg total) by mouth daily. 90 tablet 3  . furosemide (LASIX) 20 MG tablet Take 20 mg by mouth every other day. Take 1-2 tablets daily.    . hydroxypropyl methylcellulose (ISOPTO TEARS) 2.5 % ophthalmic solution Place 1 drop into both eyes as needed.     . metoprolol tartrate (LOPRESSOR) 25 MG tablet Take 12.5 mg by mouth 2 (two) times daily.     . Multiple Vitamin (MULTIVITAMIN) tablet Take 1 tablet by mouth daily.      [name] oxybutynin (DITROPAN-XL) 5 MG 24 hr tablet Take 1 tablet (5 mg total) by mouth daily. 90 tablet 3  . oxyCODONE-acetaminophen (ROXICET) 5-325 MG tablet Take 1 tablet by mouth every 6 (six) hours as needed for severe pain. 20 tablet 0  . pantoprazole (PROTONIX) 40 MG tablet TAKE 1 TABLET BY MOUTH TWICE A DAY 60 tablet 1  . pantoprazole (PROTONIX) 40 MG tablet TAKE 1 TABLET BY MOUTH TWICE A DAY 60 tablet 1  . predniSONE (DELTASONE) 20 MG tablet as directed.     . predniSONE (DELTASONE) 5 MG tablet Take 5 mg by mouth daily.     . simethicone (MYLICON) 80 MG chewable tablet Chew 1 tablet (80 mg total) by mouth every 6 (six) hours as needed for flatulence. 120 tablet 0  . sucralfate (CARAFATE) 1 g tablet Take 1 tablet (1 g total) by mouth 4 (four) times daily. Resume taking after one week- once finished taking oral levaquine. ( to avoid interaction.) 40 tablet 3  . tacrolimus  (PROGRAF) 1 MG capsule Take 3 mg by mouth 2 (two) times daily. Reported on 08/16/2015    . tamsulosin (FLOMAX) 0.4 MG CAPS capsule Take 1 capsule (0.4 mg total) by mouth daily. 90 capsule 3  . traZODone (DESYREL) 50 MG tablet 1-2 TABS AS NEEDED FOR SLEEP  3  . ondansetron (ZOFRAN) 4 MG tablet Take 1 tablet (4 mg total) by mouth every 6 (six) hours as needed for nausea. (Patient not taking: Reported on 01/16/2017) 20 tablet 0   No current  facility-administered medications for this visit.    Facility-Administered Medications Ordered in Other Visits  Medication Dose Route Frequency Provider Last Rate Last Dose  . sodium chloride flush (NS) 0.9 % injection 10 mL  10 mL Intracatheter PRN Lequita Asal, MD        Review of Systems:  GENERAL:  Feels "good".  No fevers or sweats.  Weight up 2 pounds. PERFORMANCE STATUS (ECOG):  1 HEENT:  No visual changes, sore throat, mouth sores or tenderness. Lungs: No shortness of breath or cough.  No hemoptysis. Cardiac:  No chest pain, palpitations, orthopnea, or PND. GI: Eating well.  No nausea, vomiting, diarrhea, constipation, melena or hematochezia. GU:  Enlarged prostate.  No urgency, frequency, dysuria, or hematuria.  Left inguinal hernia. Musculoskeletal:  Right rib pain.  No back pain.  No joint pain.  No muscle tenderness. Extremities:  No pain or swelling. Skin:  Nail changes.  No rashes or skin changes. Neuro:  No headache, numbness or weakness, balance or coordination issues. Endocrine:  No diabetes, thyroid issues, hot flashes or night sweats. Psych:  No mood changes, depression or anxiety. Pain:  Right ribcage pain. Review of systems:  All other systems reviewed and found to be negative.  Physical Exam: Blood pressure (!) 130/50, pulse (!) 56, temperature 97.7 F (36.5 C), temperature source Tympanic, resp. rate 18, weight 126 lb 2 oz (57.2 kg). GENERAL:  Thin elderly gentleman sitting comfortably in the exam room in no acute distress.  He has a cane at his side. MENTAL STATUS:  Alert and oriented to person, place and time. HEAD:  Lu [place]el.  Temporal wasting.  Normocephalic, atraumatic, face symmetric, no Cushingoid features. EYES:  Glasses.  Brown eyes.  Pupils equal round and reactive to light and accomodation.  No conjunctivitis or scleral icterus. ENT:  Oropharynx clear without lesion.  Edentulous.  Tongue normal. Mucous membranes moist.  RESPIRATORY:  Clear to auscultation without rales, wheezes or rhonchi. CARDIOVASCULAR:  Regular rate and rhythm without murmur, rub or gallop. ABDOMEN:  Soft, non-tender, with active bowel sounds, and no hepatosplenomegaly.  No masses.  Palpable transplanted kidney.  SKIN:  No rashes, ulcers or lesions. EXTREMITIES:  No edema, no skin discoloration or tenderness.  Right upper extremity with dilated vascular s/p AV fistula (chronic).  No palpable cords. NEUROLOGICAL: Unremarkable. PSYCH:  Appropriate.    Imaging studies:  04/28/2016 PET scan:  Bulky intensely hypermetabolic periaortic upper abdominal and mesenteric adenopathy concerning for high-grade lymphoma.  There was hypermetabolic liver metastasis.  There was hpermetabolic lesion involving the small bowel of the upper pelvis.  There were multiple sites of hypermetabolic skeletal metastasis. There was moderate volume right pneumothorax. 08/12/2016 PET scan:  Interval response to therapy. There has been significant decrease in extent of hypermetabolic tumor within the neck, chest, abdomen and pelvis as well as the axial and appendicular skeleton.  There was residual enlarged and hypermetabolic small bowel mesenteric and periaortic lymph nodes. There was a persistent hypermetabolic focus of increased uptake within the spleen.  There was resolution of previous multifocal hypermetabolic lesions within the liver. 10/10/2016 PET scan:  Slight worsening compared to the prior study. Several lesions were slightly more hypermetabolic and there  appeared to be a newly enlarged and hypermetabolic mesenteric node adjacent to one of the previous mesenteric lymph nodes. However, there are no new hypermetabolic lesions in the neck, chest, or skeleton.  The small hypermetabolic peripheral lesion of the right kidney upper pole is slightly less hypermetabolic. 48/25/0037 PET  scan:  Marked progression of disease as evidenced by progressive bulky mesenteric adenopathy, new low left internal jugular/left juxta diaphragmatic/abdominal retroperitoneal adenopathy, new pulmonary nodules, new hepatic and splenic lesions and new peritoneal nodules, all of which are hypermetabolic   Appointment on 02/10/2017  Component Date Value Ref Range Status  . WBC 02/10/2017 1.1* 3.8 - 10.6 K/uL Final   Comment: RESULT REPEATED AND VERIFIED CANCER CENTER CRITICAL VALUE PROTOCOL   . RBC 02/10/2017 3.27* 4.40 - 5.90 MIL/uL Final  . Hemoglobin 02/10/2017 8.7* 13.0 - 18.0 g/dL Final  . HCT 02/10/2017 26.3* 40.0 - 52.0 % Final  . MCV 02/10/2017 80.4  80.0 - 100.0 fL Final  . MCH 02/10/2017 26.7  26.0 - 34.0 pg Final  . MCHC 02/10/2017 33.3  32.0 - 36.0 g/dL Final  . RDW 02/10/2017 20.5* 11.5 - 14.5 % Final  . Platelets 02/10/2017 58* 150 - 440 K/uL Final  . Neutrophils Relative % 02/10/2017 61  % Final  . Lymphocytes Relative 02/10/2017 30  % Final  . Monocytes Relative 02/10/2017 6  % Final  . Eosinophils Relative 02/10/2017 2  % Final  . Basophils Relative 02/10/2017 1  % Final  . Neutro Abs 02/10/2017 0.7* 1.4 - 6.5 K/uL Final  . Lymphs Abs 02/10/2017 0.3* 1.0 - 3.6 K/uL Final  . Monocytes Absolute 02/10/2017 0.1* 0.2 - 1.0 K/uL Final  . Eosinophils Absolute 02/10/2017 0.0  0 - 0.7 K/uL Final  . Basophils Absolute 02/10/2017 0.0  0 - 0.1 K/uL Final  . RBC Morphology 02/10/2017 MIXED RBC POPULATION   Final  . Smear Review 02/10/2017 SMEAR CONSISTANT WITH NEULASTA ADMIN ON 02/05/17    Final  . Sodium 02/10/2017 137  135 - 145 mmol/L Final  . Potassium  02/10/2017 5.3* 3.5 - 5.1 mmol/L Final  . Chloride 02/10/2017 105  101 - 111 mmol/L Final  . CO2 02/10/2017 28  22 - 32 mmol/L Final  . Glucose, Bld 02/10/2017 99  65 - 99 mg/dL Final  . BUN 02/10/2017 19  6 - 20 mg/dL Final  . Creatinine, Ser 02/10/2017 1.17  0.61 - 1.24 mg/dL Final  . Calcium 02/10/2017 10.6* 8.9 - 10.3 mg/dL Final  . GFR calc non Af Amer 02/10/2017 59* >60 mL/min Final  . GFR calc Af Amer 02/10/2017 >60  >60 mL/min Final   Comment: (NOTE) The eGFR has been calculated using the CKD EPI equation. This calculation has not been validated in all clinical situations. eGFR's persistently <60 mL/min signify possible Chronic Kidney Disease.   Georgiann Hahn gap 02/10/2017 4* 5 - 15 Final    Assessment:  BARON PARMELEE is a 76 y.o. male s/p renal transplant (2007) with a post-transplant lymphoproliferative disorder, stage IV diffuse large B cell lymphoma.  He presented with a 2-3 month history of progressive back pain superimposed on chronic back pain.    PET scan on 04/28/2016 revealed bulky intensely hypermetabolic periaortic upper abdominal and mesenteric adenopathy concerning for high-grade lymphoma.  There was hypermetabolic liver metastasis.  There was hpermetabolic lesion involving the small bowel of the upper pelvis.  There were multiple sites of hypermetabolic skeletal metastasis. There was moderate volume right pneumothorax.  CT guided retroperitoneal node biopsy on 04/30/2016 revealed diffuse large B cell lymphoma.  Hepatitis B and C testing were negative on 05/06/2016 and 02/03/2017.  Echo on 05/06/2016 revealed an EF of 55-60%.  Echo on 09/01/2016 revealed an EF of 60-65%.  Bone marrow aspirate and biopsy on 05/08/2016 revealed multifocal marrow  involvement by diffuse large B-cell lymphoma. There was variably cellular marrow for age (50% - 90%) with a patchy predominantly nodular large B-cell infiltrate, overall estimated to account for 20% of the core biopsy. There was adequate  residual trilineage hematopoiesis with mild nonspecific dyserythropoiesis. There was patchy mild increase in reticulin. Storage iron was present. The immunohistochemical staining pattern of the B-cell infiltrate (CD10 +/-, BCL 6+, BCL-2 +) suggested possible large cell transformation of follicular lymphoma.  Flow cytometry revealed no significant immunophenotypic abnormalities or evidence of B-cell lymphoma.  He has bone metastasis and hypercalcemia.  Calcium was 11.2 (ionized 7.3) on 05/13/2016.  He received Zometa on 05/13/2016.  He began [place] on 06/09/2016 (last 08/05/2016).  Work-up on 04/15/2016 revealed the following normal studies: ferritin (343), iron saturation (6%), TIBC (241; low), B12 (525), folate (27).  Reticulocyte count was 2%.  LDH was 409.  Uric acid was 7.7 (4.4 - 7.6).  He was admitted at [facility] from 04/28/2016 - 04/30/2016 with a moderate volume right sided pneumothorax.  His pneumothorax improved spontaneously. Plain films of the right femur revealed the lucent bone lesion in the right femoral neck  was poorly characterized.  There was no evidence for an acute fracture.  PTH was 106 (high) 04/30/2016 with a calcium of 11.2.  Etiology was c/w primary hyperparathyroidism.  PTH-related polypeptide was < 1.1 on 04/30/2016.  He has a history of GI bleeding in 08/2014.  He underwent tagged RBC scan which revealed an active bleed in the hepatic flexure.  He was embolized in vascular interventional radiology.  He was admitted to [facility] from 07/08/2016 - 07/13/2016 with GI bleeding.  EGD on 07/09/2016 revealed multiple non-bleeding duodenal ulcer as well as duodenitis. Tagged RBC scan on 07/11/2016 revealed active GI bleed in the lateral right mid abdomen coursing through multiple curvilinear bowel loops in the right mid abdomen favoring a small bowel source of bleeding.  He received 5 units of PRBCs, 2 units of pheresed platelets, and vitamin K from 07/07/2016 0 07/13/2016.  He was transferred  to [facility] from 07/13/2016 - 07/16/2016.  [facility] performed a colonoscopy and push enteroscopy which showed several diverticula and old blood but no source of bleeding. Source of bleed was presumed to be diverticular, although it was possible a small bowel site was not visualized on endoscopy.  He has a history of renal failure s/p renal transplant.  He is tacrolimus (Prograf), and steroids  Creatinine has ranged between 1.43 - 1.93 in the past 6 months. Mycophenolate (MMF) was discontinued at diagnosis.  Prograf level was 5.5 (3.0-8.0) on 05/08/2016 and 2.2 on 08/13/2016.   He received 6 cycles of mini-RCHOP (05/09/2016 - 09/05/2016).  Cycle #1 was complicated by fever and neutropenia.  Cycle #3 was complicated by a GI bleed.  CSF on 06/19/2016 revealed 7 WBCs (4% segs, 79% lymphs, and 17% monocytes).  He has undergone LP with IT MTX x 4 (07/24/2016, 08/14/2016, 09/04/2016, and 10/02/2016).  Cytology was negative on 07/24/2016 and 09/04/2016.  PET scan on 10/10/2016 revealed slight worsening compared to the prior study. Several lesions were slightly more hypermetabolic and there appeared to be a newly enlarged and hypermetabolic mesenteric node adjacent to one of the previous mesenteric lymph nodes. However, there are no new hypermetabolic lesions in the neck, chest, or skeleton.  The small hypermetabolic peripheral lesion of the right kidney upper pole is slightly less hypermetabolic.  He was lost to follow-up.  PET scan on 01/14/2017 revealed marked progression of disease as evidenced  by progressive bulky mesenteric adenopathy, new low left internal jugular/left juxta diaphragmatic/abdominal retroperitoneal adenopathy, new pulmonary nodules, new hepatic and splenic lesions and new peritoneal nodules, all of which are hypermetabolic.  LDH was 480 on 02/06/2017.  He is day 7 of cycle #1 gemcitabine and oxaliplatin + obinutuzumab (02/04/2017).  Symptomatically, he denies any B symptoms.  He is eating  well.  He has gained weight.  Exam reveals no adenopathy. Hemoglobin is 8.7, platelets 58,000, and WBC 1100 with an ANC of 700.  Potassium 5.3. Calcium is 10.6.  Plan:  1.  Labs today: CBC with diff, BMP, uric acid, hold tube. 2.  Discuss elevated calcium.  Consider use of Zometa.  Discuss with patient's transplant nephrologist. 3.  Discussed elevated potassium. Reports that he eats "a lot" of bananas. Discuss issues with tumor lysis.  Encouraged to reduce dietary potassium.  4.  Discuss anemia.  Patient asymptomatic.  Continue to observe without intervention. 5.  Discuss neutropenic precautions. 6.  RTC in 2 days for (CBC with diff, BMP, and uric acid), +/- PRBCs 7.  RTC on 02/19/2017 for MD assessment, labs (CBC with diff, CMP, LDH, uric acid), and cycle #2 gemcitabine and oxaliplatin + obinutuzumab.   Melissa C. Mike Gip, MD  02/10/2017, 11:47 AM

## 2017-02-11 LAB — SAMPLE TO BLOOD BANK

## 2017-02-12 ENCOUNTER — Inpatient Hospital Stay: Payer: Medicare HMO

## 2017-02-12 ENCOUNTER — Other Ambulatory Visit: Payer: Self-pay | Admitting: Hematology and Oncology

## 2017-02-12 ENCOUNTER — Other Ambulatory Visit: Payer: Self-pay | Admitting: *Deleted

## 2017-02-12 DIAGNOSIS — C7800 Secondary malignant neoplasm of unspecified lung: Secondary | ICD-10-CM

## 2017-02-12 DIAGNOSIS — C7889 Secondary malignant neoplasm of other digestive organs: Secondary | ICD-10-CM

## 2017-02-12 DIAGNOSIS — C8333 Diffuse large B-cell lymphoma, intra-abdominal lymph nodes: Secondary | ICD-10-CM

## 2017-02-12 DIAGNOSIS — C787 Secondary malignant neoplasm of liver and intrahepatic bile duct: Secondary | ICD-10-CM

## 2017-02-12 DIAGNOSIS — D696 Thrombocytopenia, unspecified: Secondary | ICD-10-CM

## 2017-02-12 DIAGNOSIS — Z5111 Encounter for antineoplastic chemotherapy: Secondary | ICD-10-CM | POA: Diagnosis not present

## 2017-02-12 DIAGNOSIS — C8330 Diffuse large B-cell lymphoma, unspecified site: Secondary | ICD-10-CM

## 2017-02-12 LAB — BASIC METABOLIC PANEL
Anion gap: 5 (ref 5–15)
BUN: 20 mg/dL (ref 6–20)
CO2: 28 mmol/L (ref 22–32)
Calcium: 11.1 mg/dL — ABNORMAL HIGH (ref 8.9–10.3)
Chloride: 103 mmol/L (ref 101–111)
Creatinine, Ser: 1.37 mg/dL — ABNORMAL HIGH (ref 0.61–1.24)
GFR calc Af Amer: 56 mL/min — ABNORMAL LOW (ref >60)
GFR calc non Af Amer: 49 mL/min — ABNORMAL LOW (ref >60)
Glucose, Bld: 105 mg/dL — ABNORMAL HIGH (ref 65–99)
Potassium: 4.7 mmol/L (ref 3.5–5.1)
Sodium: 136 mmol/L (ref 135–145)

## 2017-02-12 LAB — CBC WITH DIFFERENTIAL/PLATELET
Basophils Absolute: 0 10*3/uL (ref 0–0.1)
Basophils Relative: 0 %
Eosinophils Absolute: 0 10*3/uL (ref 0–0.7)
Eosinophils Relative: 1 %
HCT: 25.7 % — ABNORMAL LOW (ref 40.0–52.0)
Hemoglobin: 8.8 g/dL — ABNORMAL LOW (ref 13.0–18.0)
Lymphocytes Relative: 21 %
Lymphs Abs: 0.5 10*3/uL — ABNORMAL LOW (ref 1.0–3.6)
MCH: 27.2 pg (ref 26.0–34.0)
MCHC: 34.4 g/dL (ref 32.0–36.0)
MCV: 79.2 fL — ABNORMAL LOW (ref 80.0–100.0)
Monocytes Absolute: 0.4 10*3/uL (ref 0.2–1.0)
Monocytes Relative: 15 %
Neutro Abs: 1.6 10*3/uL (ref 1.4–6.5)
Neutrophils Relative %: 63 %
Platelets: 36 10*3/uL — ABNORMAL LOW (ref 150–440)
RBC: 3.24 MIL/uL — ABNORMAL LOW (ref 4.40–5.90)
RDW: 20 % — ABNORMAL HIGH (ref 11.5–14.5)
Smear Review: DECREASED
WBC: 2.5 10*3/uL — ABNORMAL LOW (ref 3.8–10.6)

## 2017-02-12 LAB — URIC ACID: Uric Acid, Serum: 6.7 mg/dL (ref 4.4–7.6)

## 2017-02-13 ENCOUNTER — Other Ambulatory Visit: Payer: Self-pay | Admitting: Hematology and Oncology

## 2017-02-13 ENCOUNTER — Telehealth: Payer: Self-pay | Admitting: *Deleted

## 2017-02-13 ENCOUNTER — Inpatient Hospital Stay: Payer: Medicare HMO

## 2017-02-13 ENCOUNTER — Other Ambulatory Visit: Payer: Self-pay | Admitting: *Deleted

## 2017-02-13 DIAGNOSIS — D696 Thrombocytopenia, unspecified: Secondary | ICD-10-CM

## 2017-02-13 DIAGNOSIS — Z5111 Encounter for antineoplastic chemotherapy: Secondary | ICD-10-CM | POA: Diagnosis not present

## 2017-02-13 DIAGNOSIS — C8333 Diffuse large B-cell lymphoma, intra-abdominal lymph nodes: Secondary | ICD-10-CM

## 2017-02-13 DIAGNOSIS — C8330 Diffuse large B-cell lymphoma, unspecified site: Secondary | ICD-10-CM

## 2017-02-13 LAB — CBC
HCT: 25.2 % — ABNORMAL LOW (ref 40.0–52.0)
Hemoglobin: 8.7 g/dL — ABNORMAL LOW (ref 13.0–18.0)
MCH: 27.5 pg (ref 26.0–34.0)
MCHC: 34.6 g/dL (ref 32.0–36.0)
MCV: 79.3 fL — ABNORMAL LOW (ref 80.0–100.0)
Platelets: 34 10*3/uL — ABNORMAL LOW (ref 150–440)
RBC: 3.18 MIL/uL — ABNORMAL LOW (ref 4.40–5.90)
RDW: 20.3 % — ABNORMAL HIGH (ref 11.5–14.5)
WBC: 5.2 10*3/uL (ref 3.8–10.6)

## 2017-02-13 LAB — SAMPLE TO BLOOD BANK

## 2017-02-13 LAB — BASIC METABOLIC PANEL
Anion gap: 5 (ref 5–15)
BUN: 22 mg/dL — ABNORMAL HIGH (ref 6–20)
CO2: 28 mmol/L (ref 22–32)
Calcium: 10.8 mg/dL — ABNORMAL HIGH (ref 8.9–10.3)
Chloride: 104 mmol/L (ref 101–111)
Creatinine, Ser: 1.37 mg/dL — ABNORMAL HIGH (ref 0.61–1.24)
GFR calc Af Amer: 56 mL/min — ABNORMAL LOW (ref >60)
GFR calc non Af Amer: 49 mL/min — ABNORMAL LOW (ref >60)
Glucose, Bld: 108 mg/dL — ABNORMAL HIGH (ref 65–99)
Potassium: 4.8 mmol/L (ref 3.5–5.1)
Sodium: 137 mmol/L (ref 135–145)

## 2017-02-13 NOTE — Telephone Encounter (Signed)
Called patient and spoke to wife.  Instructed them about appt on Monday for lab recheck and possible blood and or platelets voiced understanding.  Wife instructed to call MD on call or go to the ED if patient noticed any bleeding chest pain or sob.

## 2017-02-14 ENCOUNTER — Encounter: Payer: Self-pay | Admitting: Hematology and Oncology

## 2017-02-14 DIAGNOSIS — D701 Agranulocytosis secondary to cancer chemotherapy: Secondary | ICD-10-CM | POA: Insufficient documentation

## 2017-02-14 DIAGNOSIS — T451X5A Adverse effect of antineoplastic and immunosuppressive drugs, initial encounter: Secondary | ICD-10-CM

## 2017-02-14 DIAGNOSIS — D696 Thrombocytopenia, unspecified: Secondary | ICD-10-CM | POA: Insufficient documentation

## 2017-02-16 ENCOUNTER — Inpatient Hospital Stay (HOSPITAL_BASED_OUTPATIENT_CLINIC_OR_DEPARTMENT_OTHER): Payer: Medicare HMO | Admitting: Oncology

## 2017-02-16 ENCOUNTER — Encounter: Payer: Self-pay | Admitting: Emergency Medicine

## 2017-02-16 ENCOUNTER — Other Ambulatory Visit: Payer: Self-pay | Admitting: Oncology

## 2017-02-16 ENCOUNTER — Ambulatory Visit
Admission: RE | Admit: 2017-02-16 | Discharge: 2017-02-16 | Disposition: A | Discharge status: Home / Self Care | Payer: Medicare HMO | Source: Ambulatory Visit | Attending: Oncology | Admitting: Oncology

## 2017-02-16 ENCOUNTER — Inpatient Hospital Stay: Payer: Medicare HMO

## 2017-02-16 ENCOUNTER — Other Ambulatory Visit: Payer: Self-pay | Admitting: *Deleted

## 2017-02-16 ENCOUNTER — Emergency Department
Admission: EM | Admit: 2017-02-16 | Discharge: 2017-02-17 | Disposition: A | Discharge status: Home / Self Care | Payer: Medicare HMO | Attending: Emergency Medicine | Admitting: Emergency Medicine

## 2017-02-16 VITALS — BP 148/67 | HR 54 | Temp 96.7°F | Resp 18 | Wt 123.2 lb

## 2017-02-16 DIAGNOSIS — R1031 Right lower quadrant pain: Secondary | ICD-10-CM

## 2017-02-16 DIAGNOSIS — M109 Gout, unspecified: Secondary | ICD-10-CM

## 2017-02-16 DIAGNOSIS — C787 Secondary malignant neoplasm of liver and intrahepatic bile duct: Secondary | ICD-10-CM | POA: Diagnosis not present

## 2017-02-16 DIAGNOSIS — Z5111 Encounter for antineoplastic chemotherapy: Secondary | ICD-10-CM | POA: Diagnosis not present

## 2017-02-16 DIAGNOSIS — I129 Hypertensive chronic kidney disease with stage 1 through stage 4 chronic kidney disease, or unspecified chronic kidney disease: Secondary | ICD-10-CM | POA: Diagnosis not present

## 2017-02-16 DIAGNOSIS — Z87891 Personal history of nicotine dependence: Secondary | ICD-10-CM | POA: Diagnosis not present

## 2017-02-16 DIAGNOSIS — C8330 Diffuse large B-cell lymphoma, unspecified site: Secondary | ICD-10-CM | POA: Insufficient documentation

## 2017-02-16 DIAGNOSIS — E785 Hyperlipidemia, unspecified: Secondary | ICD-10-CM | POA: Diagnosis not present

## 2017-02-16 DIAGNOSIS — N429 Disorder of prostate, unspecified: Secondary | ICD-10-CM | POA: Diagnosis present

## 2017-02-16 DIAGNOSIS — N400 Enlarged prostate without lower urinary tract symptoms: Secondary | ICD-10-CM

## 2017-02-16 DIAGNOSIS — C7800 Secondary malignant neoplasm of unspecified lung: Secondary | ICD-10-CM | POA: Insufficient documentation

## 2017-02-16 DIAGNOSIS — E042 Nontoxic multinodular goiter: Secondary | ICD-10-CM

## 2017-02-16 DIAGNOSIS — E210 Primary hyperparathyroidism: Secondary | ICD-10-CM | POA: Diagnosis not present

## 2017-02-16 DIAGNOSIS — N4281 Prostatodynia syndrome: Secondary | ICD-10-CM

## 2017-02-16 DIAGNOSIS — R102 Pelvic and perineal pain: Secondary | ICD-10-CM | POA: Insufficient documentation

## 2017-02-16 DIAGNOSIS — K219 Gastro-esophageal reflux disease without esophagitis: Secondary | ICD-10-CM

## 2017-02-16 DIAGNOSIS — C8333 Diffuse large B-cell lymphoma, intra-abdominal lymph nodes: Secondary | ICD-10-CM

## 2017-02-16 DIAGNOSIS — D47Z1 Post-transplant lymphoproliferative disorder (PTLD): Secondary | ICD-10-CM | POA: Diagnosis not present

## 2017-02-16 DIAGNOSIS — R1012 Left upper quadrant pain: Secondary | ICD-10-CM | POA: Diagnosis not present

## 2017-02-16 DIAGNOSIS — Z7952 Long term (current) use of systemic steroids: Secondary | ICD-10-CM

## 2017-02-16 DIAGNOSIS — R51 Headache: Secondary | ICD-10-CM | POA: Diagnosis not present

## 2017-02-16 DIAGNOSIS — Z8619 Personal history of other infectious and parasitic diseases: Secondary | ICD-10-CM

## 2017-02-16 DIAGNOSIS — Z940 Kidney transplant status: Secondary | ICD-10-CM

## 2017-02-16 DIAGNOSIS — R1032 Left lower quadrant pain: Secondary | ICD-10-CM | POA: Diagnosis not present

## 2017-02-16 DIAGNOSIS — C8339 Diffuse large B-cell lymphoma, extranodal and solid organ sites: Secondary | ICD-10-CM | POA: Diagnosis not present

## 2017-02-16 DIAGNOSIS — I4891 Unspecified atrial fibrillation: Secondary | ICD-10-CM

## 2017-02-16 DIAGNOSIS — Z8719 Personal history of other diseases of the digestive system: Secondary | ICD-10-CM

## 2017-02-16 DIAGNOSIS — Z79899 Other long term (current) drug therapy: Secondary | ICD-10-CM | POA: Insufficient documentation

## 2017-02-16 DIAGNOSIS — N186 End stage renal disease: Secondary | ICD-10-CM | POA: Diagnosis not present

## 2017-02-16 DIAGNOSIS — I272 Pulmonary hypertension, unspecified: Secondary | ICD-10-CM

## 2017-02-16 DIAGNOSIS — M1990 Unspecified osteoarthritis, unspecified site: Secondary | ICD-10-CM

## 2017-02-16 DIAGNOSIS — Z800 Family history of malignant neoplasm of digestive organs: Secondary | ICD-10-CM

## 2017-02-16 LAB — URINALYSIS, COMPLETE (UACMP) WITH MICROSCOPIC
Bacteria, UA: NONE SEEN
Bilirubin Urine: NEGATIVE
GLUCOSE, UA: NEGATIVE mg/dL
HGB URINE DIPSTICK: NEGATIVE
KETONES UR: NEGATIVE mg/dL
LEUKOCYTES UA: NEGATIVE
Nitrite: NEGATIVE
PROTEIN: NEGATIVE mg/dL
Specific Gravity, Urine: 1.008 (ref 1.005–1.030)
pH: 5 (ref 5.0–8.0)

## 2017-02-16 LAB — COMPREHENSIVE METABOLIC PANEL
ALBUMIN: 3.9 g/dL (ref 3.5–5.0)
ALT: 14 U/L — ABNORMAL LOW (ref 17–63)
ALT: 14 U/L — ABNORMAL LOW (ref 17–63)
ANION GAP: 7 (ref 5–15)
AST: 34 U/L (ref 15–41)
AST: 35 U/L (ref 15–41)
Albumin: 3.6 g/dL (ref 3.5–5.0)
Alkaline Phosphatase: 114 U/L (ref 38–126)
Alkaline Phosphatase: 117 U/L (ref 38–126)
Anion gap: 5 (ref 5–15)
BUN: 15 mg/dL (ref 6–20)
BUN: 16 mg/dL (ref 6–20)
CHLORIDE: 102 mmol/L (ref 101–111)
CO2: 28 mmol/L (ref 22–32)
CO2: 27 mmol/L (ref 22–32)
Calcium: 10.8 mg/dL — ABNORMAL HIGH (ref 8.9–10.3)
Calcium: 10.9 mg/dL — ABNORMAL HIGH (ref 8.9–10.3)
Chloride: 99 mmol/L — ABNORMAL LOW (ref 101–111)
Creatinine, Ser: 1.35 mg/dL — ABNORMAL HIGH (ref 0.61–1.24)
Creatinine, Ser: 1.55 mg/dL — ABNORMAL HIGH (ref 0.61–1.24)
GFR calc Af Amer: 57 mL/min — ABNORMAL LOW (ref >60)
GFR calc Af Amer: 48 mL/min — ABNORMAL LOW (ref >60)
GFR calc non Af Amer: 49 mL/min — ABNORMAL LOW (ref >60)
GFR calc non Af Amer: 42 mL/min — ABNORMAL LOW (ref >60)
GLUCOSE: 140 mg/dL — AB (ref 65–99)
Glucose, Bld: 99 mg/dL (ref 65–99)
POTASSIUM: 4.8 mmol/L (ref 3.5–5.1)
Potassium: 4.7 mmol/L (ref 3.5–5.1)
SODIUM: 136 mmol/L (ref 135–145)
Sodium: 132 mmol/L — ABNORMAL LOW (ref 135–145)
TOTAL PROTEIN: 6.5 g/dL (ref 6.5–8.1)
Total Bilirubin: 0.7 mg/dL (ref 0.3–1.2)
Total Bilirubin: 0.8 mg/dL (ref 0.3–1.2)
Total Protein: 6 g/dL — ABNORMAL LOW (ref 6.5–8.1)

## 2017-02-16 LAB — CBC WITH DIFFERENTIAL/PLATELET
BAND NEUTROPHILS: 0 %
BASOS ABS: 0 10*3/uL (ref 0–0.1)
BASOS PCT: 0 %
BLASTS: 0 %
Basophils Absolute: 0 10*3/uL (ref 0–0.1)
Basophils Relative: 0 %
EOS ABS: 0.2 10*3/uL (ref 0–0.7)
Eosinophils Absolute: 0 10*3/uL (ref 0–0.7)
Eosinophils Relative: 0 %
Eosinophils Relative: 1 %
HCT: 26.4 % — ABNORMAL LOW (ref 40.0–52.0)
HCT: 29.2 % — ABNORMAL LOW (ref 40.0–52.0)
Hemoglobin: 9 g/dL — ABNORMAL LOW (ref 13.0–18.0)
Hemoglobin: 9.8 g/dL — ABNORMAL LOW (ref 13.0–18.0)
LYMPHS ABS: 1.3 10*3/uL (ref 1.0–3.6)
LYMPHS PCT: 8 %
Lymphocytes Relative: 4 %
Lymphs Abs: 0.6 10*3/uL — ABNORMAL LOW (ref 1.0–3.6)
MCH: 27 pg (ref 26.0–34.0)
MCH: 26.9 pg (ref 26.0–34.0)
MCHC: 34.2 g/dL (ref 32.0–36.0)
MCHC: 33.3 g/dL (ref 32.0–36.0)
MCV: 78.9 fL — ABNORMAL LOW (ref 80.0–100.0)
MCV: 80.7 fL (ref 80.0–100.0)
METAMYELOCYTES PCT: 2 %
MONO ABS: 0.6 10*3/uL (ref 0.2–1.0)
MONOS PCT: 4 %
Monocytes Absolute: 1.1 10*3/uL — ABNORMAL HIGH (ref 0.2–1.0)
Monocytes Relative: 6 %
Myelocytes: 1 %
NEUTROS ABS: 14.1 10*3/uL — AB (ref 1.4–6.5)
Neutro Abs: 15.6 10*3/uL — ABNORMAL HIGH (ref 1.4–6.5)
Neutrophils Relative %: 90 %
Neutrophils Relative %: 84 %
OTHER: 0 %
PLATELETS: 65 10*3/uL — AB (ref 150–440)
Platelets: 54 10*3/uL — ABNORMAL LOW (ref 150–440)
Promyelocytes Absolute: 0 %
RBC: 3.35 MIL/uL — ABNORMAL LOW (ref 4.40–5.90)
RBC: 3.62 MIL/uL — ABNORMAL LOW (ref 4.40–5.90)
RDW: 20.2 % — ABNORMAL HIGH (ref 11.5–14.5)
RDW: 20 % — AB (ref 11.5–14.5)
WBC: 17.4 10*3/uL — ABNORMAL HIGH (ref 3.8–10.6)
WBC: 16.2 10*3/uL — ABNORMAL HIGH (ref 3.8–10.6)
nRBC: 0 /100 WBC

## 2017-02-16 LAB — SAMPLE TO BLOOD BANK

## 2017-02-16 MED ORDER — SODIUM CHLORIDE 0.9 % IV BOLUS (SEPSIS)
500.0000 mL | Freq: Once | INTRAVENOUS | Status: AC
  Administered 2017-02-16: 500 mL via INTRAVENOUS

## 2017-02-16 MED ORDER — ONDANSETRON HCL 4 MG/2ML IJ SOLN
4.0000 mg | Freq: Once | INTRAMUSCULAR | Status: AC
  Administered 2017-02-16: 4 mg via INTRAVENOUS
  Filled 2017-02-16: qty 2

## 2017-02-16 MED ORDER — IOPAMIDOL (ISOVUE-300) INJECTION 61%
15.0000 mL | INTRAVENOUS | Status: AC
  Administered 2017-02-16 – 2017-02-17 (×2): 15 mL via ORAL

## 2017-02-16 MED ORDER — MORPHINE SULFATE (PF) 2 MG/ML IV SOLN
2.0000 mg | Freq: Once | INTRAVENOUS | Status: AC
  Administered 2017-02-16: 2 mg via INTRAVENOUS
  Filled 2017-02-16: qty 1

## 2017-02-16 NOTE — ED Triage Notes (Signed)
Pt arrived via pov. Pt reports having a ultrasound of his prostate today and when he reported to his pcp that he was still in pain he was told to come into the ED for further evaluation. Pt reports pain on a 8 on a 0-10 scale and reports the pain is in his lower right pelvis.

## 2017-02-16 NOTE — Progress Notes (Signed)
Patient states he is having pain 8/10 in right groin area.  Otherwise, no complaints.

## 2017-02-16 NOTE — Progress Notes (Signed)
Symptom Management Consult note [facility]  [phone] [phone]  Patient Care Team: Arnetha Courser, MD as PCP - General (Family Medicine) Claybon Jabs, MD (Gastroenterology) Dewitt Rota, MD (Gastroenterology) True, Isabella Bowens, MD as Referring Physician (Nephrology) Prudencio Burly Lenna Sciara, MD (Internal Medicine) Rushing, Ledell Noss, MD (Ophthalmology) Go, Kelby Aline, MD as Referring Physician (Ophthalmology) True, Isabella Bowens, MD as Referring Physician (Nephrology) Sharlet Salina, MD as Referring Physician (Physical Medicine and Rehabilitation) Merlene Morse, MD as Referring Physician (Orthopedic Surgery) Dittus, Runell Gess, DO as Referring Physician (Pediatrics)   Name of the patient: Matthew Brown  [id number]  1941-02-29   Date of visit: 02/16/17  Diagnosis- post-transplant lymphoproliferative disorder, stage IVBE diffuse large B cell lymphoma,  Chief complaint/ Reason for visit- Right sided Groin Pain  Heme/Onc history: Matthew Brown is a 76 y.o. male s/p renal transplant (2007) with a post-transplant lymphoproliferative disorder, stage IV diffuse large B cell lymphoma.  He presented with a 2-3 month history of progressive back pain superimposed on chronic back pain.    PET scan on 04/28/2016 revealed bulky intensely hypermetabolic periaortic upper abdominal and mesenteric adenopathy concerning for high-grade lymphoma.  There was hypermetabolic liver metastasis.  There was hpermetabolic lesion involving the small bowel of the upper pelvis.  There were multiple sites of hypermetabolic skeletal metastasis. There was moderate volume right pneumothorax.  CT guided retroperitoneal node biopsy on 04/30/2016 revealed diffuse large B cell lymphoma.  Hepatitis B and C testing were negative on 05/06/2016 and 02/03/2017.  Echo on 05/06/2016 revealed an EF of 55-60%.  Echo on 09/01/2016 revealed an EF of 60-65%.  Bone marrow  aspirate and biopsy on 05/08/2016 revealed multifocal marrow involvement by diffuse large B-cell lymphoma. There was variably cellular marrow for age (50% - 90%) with a patchy predominantly nodular large B-cell infiltrate, overall estimated to account for 20% of the core biopsy. There was adequate residual trilineage hematopoiesis with mild nonspecific dyserythropoiesis. There was patchy mild increase in reticulin. Storage iron was present. The immunohistochemical staining pattern of the B-cell infiltrate (CD10 +/-, BCL 6+, BCL-2 +) suggested possible large cell transformation of follicular lymphoma.  Flow cytometry revealed no significant immunophenotypic abnormalities or evidence of B-cell lymphoma.  He has bone metastasis and hypercalcemia.  Calcium was 11.2 (ionized 7.3) on 05/13/2016.  He received Zometa on 05/13/2016.  He began [place] on 06/09/2016 (last 08/05/2016).  Work-up on 04/15/2016 revealed the following normal studies: ferritin (343), iron saturation (6%), TIBC (241; low), B12 (525), folate (27).  Reticulocyte count was 2%.  LDH was 409.  Uric acid was 7.7 (4.4 - 7.6).  He was admitted at [facility] from 04/28/2016 - 04/30/2016 with a moderate volume right sided pneumothorax.  His pneumothorax improved spontaneously. Plain films of the right femur revealed the lucent bone lesion in the right femoral neck  was poorly characterized.  There was no evidence for an acute fracture.  PTH was 106 (high) 04/30/2016 with a calcium of 11.2.  Etiology was c/w primary hyperparathyroidism.  PTH-related polypeptide was < 1.1 on 04/30/2016.  He has a history of GI bleeding in 08/2014. He underwent tagged RBC scan which revealed an active bleed in the hepatic flexure. He was embolized in vascular interventional radiology.  He was admitted to [facility] from 07/08/2016 - 07/13/2016 with GI bleeding.  EGD on 07/09/2016 revealed multiple non-bleeding duodenal ulcer as well as duodenitis. Tagged RBC scan on 07/11/2016  revealed active GI bleed in the lateral right mid  abdomen coursing through multiple curvilinear bowel loops in the right mid abdomen favoring a small bowel source of bleeding.  He received 5 units of PRBCs, 2 units of pheresed platelets, and vitamin K from 07/07/2016 0 07/13/2016.  He was transferred to [facility] from 07/13/2016 - 07/16/2016.  [facility] performed a colonoscopy and push enteroscopy which showed several diverticula and old blood but no source of bleeding. Source of bleed was presumed to be diverticular, although it was possible a small bowel site was not visualized on endoscopy.  He has a history of renal failure s/p renal transplant.  He is tacrolimus (Prograf), and steroids  Creatinine has ranged between 1.43 - 1.93 in the past 6 months. Mycophenolate (MMF) was discontinued at diagnosis.  Prograf level was 5.5 (3.0-8.0) on 05/08/2016 and 2.2 on 08/13/2016.   He received 6 cycles of mini-RCHOP (05/09/2016 - 09/05/2016).  Cycle #1 was complicated by fever and neutropenia.  Cycle #3 was complicated by a GI bleed.  CSF on 06/19/2016 revealed 7 WBCs (4% segs, 79% lymphs, and 17% monocytes).  He has undergone LP with IT MTX x 4 (07/24/2016, 08/14/2016, 09/04/2016, and 10/02/2016).  Cytology was negative on 07/24/2016 and 09/04/2016.  PET scan on 10/10/2016 revealed slight worsening compared to the prior study. Several lesions were slightly more hypermetabolic and there appeared to be a newly enlarged and hypermetabolic mesenteric node adjacent to one of the previous mesenteric lymph nodes. However, there are no new hypermetabolic lesions in the neck, chest, or skeleton.  The small hypermetabolic peripheral lesion of the right kidney upper pole is slightly less hypermetabolic.  He was lost to follow-up.  PET scan on 01/14/2017 revealed marked progression of disease as evidenced by progressive bulky mesenteric adenopathy, new low left internal jugular/left juxta diaphragmatic/abdominal  retroperitoneal adenopathy, new pulmonary nodules, new hepatic and splenic lesions and new peritoneal nodules, all of which are hypermetabolic.  LDH was 480 on 02/06/2017.  He is day 7 of cycle #1 gemcitabine and oxaliplatin + obinutuzumab (02/04/2017).  Interval history- Patient presents today for right sided groin pain. He states the pain is 8 out of 10 and is a constant aching pain. It began approximately 1 week ago. He has not taken any pain medication for this. He denies lifting or straining. He admits to left-sided groin pain for several years and states he has been seen several times by a doctor (not sure of whom) and  "she just massages the area and it goes away". He also admits to prostate problems in the past. He does not complain of any other pains today. His last bowel movement was yesterday and it was normal. He denies nausea, vomiting, diarrhea or constipation. He denies any recent fevers or illnesses. He admits to a good appetite and denies weight loss. He denies chest pain or shortness of breath. He denies urinary complaints.   ECOG FS:1 - Symptomatic but completely ambulatory  Review of systems- Review of Systems  Constitutional: Negative for chills, fever, malaise/fatigue and weight loss.  HENT: Negative for ear discharge, ear pain, hearing loss and sinus pain.   Eyes: Negative for blurred vision, pain and discharge.  Respiratory: Negative for cough, hemoptysis, sputum production, shortness of breath and wheezing.   Cardiovascular: Negative for chest pain, palpitations and leg swelling.  Gastrointestinal: Positive for abdominal pain. Negative for constipation, diarrhea, heartburn, nausea and vomiting.       Right sided groin pain-8/10  Genitourinary: Negative for dysuria, frequency, hematuria and urgency.  Musculoskeletal: Negative for back  pain, joint pain, myalgias and neck pain.  Skin: Negative for itching and rash.  Neurological: Negative for dizziness, sensory change,  weakness and headaches.  Endo/Heme/Allergies: Negative.   Psychiatric/Behavioral: Negative.      Current treatment- Last treatment was 02/10/17 for day 7 of cycle #1 gemcitabine and oxaliplatin + obinutuzumab.  No Known Allergies   Past Medical History:  Diagnosis Date  . Benign prostatic hypertrophy   . Chronic headache 10/19/2015  . ED (erectile dysfunction)   . End stage renal disease ([place])   . Essential hypertension   . GERD (gastroesophageal reflux disease)   . GIB (gastrointestinal bleeding)    a. 01/7563 s/p R colic artery embolization;  b. 02/2015 EGD: duod ulcerative mass->Bx notable for coagulative necrosis - ? ischemia vs thrombosis-->coumadin d/c'd.  . Gout   . Hearing loss   . Hemorrhoids   . Hyperlipidemia   . Lymphoma ([place])   . Lymphoma ([place]) 2017  . Multiple thyroid nodules 06/06/2016   Noted on carotid US; dedicated US to be ordered by staff  . Osteoarthrosis, unspecified whether generalized or localized, lower leg   . Persistent atrial fibrillation ([place])    a. CHA2DS2VASc = 3-->coumadin d/c'd 02/2015 2/2 recurrent GIB.  [name] Prostatitis   . Pulmonary hypertension ([place])    a. 10/2014 Echo: EF 60-65%, mild to mod MR, mildly dil LA, nl RV, PASP 73mmHg.  [name] Renal transplant recipient   . Ulcers of both great toes [facility])      Past Surgical History:  Procedure Laterality Date  . BACK SURGERY    . ESOPHAGOGASTRODUODENOSCOPY  03/13/15   severe esophagitis, ulcerated mass  . ESOPHAGOGASTRODUODENOSCOPY (EGD) WITH PROPOFOL N/A 07/09/2016   Procedure: ESOPHAGOGASTRODUODENOSCOPY (EGD) WITH PROPOFOL;  Surgeon: [name], MD;  Location: [facility] ENDOSCOPY;  Service: Endoscopy;  Laterality: N/A;  . HERNIA REPAIR  1974  . PERIPHERAL VASCULAR CATHETERIZATION N/A 05/07/2016   Procedure: Glori Luis Cath Insertion;  Surgeon: Algernon Huxley, MD;  Location: [place] [facility];  Service: Cardiovascular;  Laterality: N/A;  . PROSTATE ABLATION    . STOMACH SURGERY     blood vessel burst  .  THROAT SURGERY    . TOTAL KNEE ARTHROPLASTY      Social History   Social History  . Marital status: Divorced    Spouse name: N/A  . Number of children: N/A  . Years of education: N/A   Occupational History  . Retired Retired   Social History Main Topics  . Smoking status: Former Smoker    Packs/day: 1.00    Years: 25.00    Types: Cigarettes    Quit date: 06/23/1978  . Smokeless tobacco: Never Used  . Alcohol use No  . Drug use: No  . Sexual activity: Yes   Other Topics Concern  . Not on file   Social History Narrative   Divorced   Does not get regular exercise    Family History  Problem Relation Age of Onset  . Cancer Mother        throat  . Diabetes Brother   . Heart disease Brother   . Stroke Brother   . Hypertension Brother   . Diabetes Sister   . Heart disease Sister   . Hypertension Sister   . Diabetes Sister   . Diabetes Brother   . COPD Neg Hx   . Kidney disease Neg Hx   . Prostate cancer Neg Hx   . Kidney cancer Neg Hx      Current  Outpatient Prescriptions:  .  acetaminophen (TYLENOL) 325 MG tablet, Take 2 tablets (650 mg total) by mouth every 6 (six) hours as needed for mild pain (or Fever >/= 101)., Disp: , Rfl:  .  albuterol (PROAIR HFA) 108 (90 BASE) MCG/ACT inhaler, Inhale 1-2 puffs into the lungs every 4 (four) hours as needed. , Disp: , Rfl:  .  allopurinol (ZYLOPRIM) 100 MG tablet, Take 100 mg by mouth daily.  , Disp: , Rfl:  .  COLCRYS 0.6 MG tablet, Take 1 tablet by mouth 2 (two) times daily as needed., Disp: , Rfl:  .  diphenhydrAMINE (BENADRYL) 25 mg capsule, Take 1 capsule (25 mg total) by mouth at bedtime as needed for sleep., Disp: 30 capsule, Rfl: 0 .  feeding supplement, ENSURE ENLIVE, (ENSURE ENLIVE) LIQD, Take 237 mLs by mouth 3 (three) times daily between meals., Disp: 90 Bottle, Rfl: 0 .  finasteride (PROSCAR) 5 MG tablet, Take 1 tablet (5 mg total) by mouth daily., Disp: 90 tablet, Rfl: 3 .  furosemide (LASIX) 20 MG tablet,  Take 20 mg by mouth every other day. Take 1-2 tablets daily., Disp: , Rfl:  .  hydroxypropyl methylcellulose (ISOPTO TEARS) 2.5 % ophthalmic solution, Place 1 drop into both eyes as needed. , Disp: , Rfl:  .  metoprolol tartrate (LOPRESSOR) 25 MG tablet, Take 12.5 mg by mouth 2 (two) times daily. , Disp: , Rfl:  .  Multiple Vitamin (MULTIVITAMIN) tablet, Take 1 tablet by mouth daily.  , Disp: , Rfl:  .  oxybutynin (DITROPAN-XL) 5 MG 24 hr tablet, Take 1 tablet (5 mg total) by mouth daily., Disp: 90 tablet, Rfl: 3 .  oxyCODONE-acetaminophen (ROXICET) 5-325 MG tablet, Take 1 tablet by mouth every 6 (six) hours as needed for severe pain., Disp: 20 tablet, Rfl: 0 .  pantoprazole (PROTONIX) 40 MG tablet, TAKE 1 TABLET BY MOUTH TWICE A DAY, Disp: 60 tablet, Rfl: 1 .  pantoprazole (PROTONIX) 40 MG tablet, TAKE 1 TABLET BY MOUTH TWICE A DAY, Disp: 60 tablet, Rfl: 1 .  predniSONE (DELTASONE) 20 MG tablet, as directed. , Disp: , Rfl:  .  predniSONE (DELTASONE) 5 MG tablet, Take 5 mg by mouth daily. , Disp: , Rfl:  .  simethicone (MYLICON) 80 MG chewable tablet, Chew 1 tablet (80 mg total) by mouth every 6 (six) hours as needed for flatulence., Disp: 120 tablet, Rfl: 0 .  sucralfate (CARAFATE) 1 g tablet, Take 1 tablet (1 g total) by mouth 4 (four) times daily. Resume taking after one week- once finished taking oral levaquine. ( to avoid interaction.), Disp: 40 tablet, Rfl: 3 .  tacrolimus (PROGRAF) 1 MG capsule, Take 3 mg by mouth 2 (two) times daily. Reported on 08/16/2015, Disp: , Rfl:  .  tamsulosin (FLOMAX) 0.4 MG CAPS capsule, Take 1 capsule (0.4 mg total) by mouth daily., Disp: 90 capsule, Rfl: 3 .  traZODone (DESYREL) 50 MG tablet, 1-2 TABS AS NEEDED FOR SLEEP, Disp: , Rfl: 3 .  ondansetron (ZOFRAN) 4 MG tablet, Take 1 tablet (4 mg total) by mouth every 6 (six) hours as needed for nausea. (Patient not taking: Reported on 01/16/2017), Disp: 20 tablet, Rfl: 0 No current facility-administered medications for  this visit.   Facility-Administered Medications Ordered in Other Visits:  .  sodium chloride flush (NS) 0.9 % injection 10 mL, 10 mL, Intracatheter, PRN, Lequita Asal, MD  Physical exam:  Vitals:   02/16/17 1025  BP: (!) 148/67  Pulse: (!) 54  Resp:  18  Temp: (!) 96.7 F (35.9 C)  TempSrc: Tympanic  Weight: 123 lb 3 oz (55.9 kg)   Physical Exam  Constitutional: He is oriented to person, place, and time and well-developed, well-nourished, and in no distress.  HENT:  Head: Normocephalic and atraumatic.  Eyes: Pupils are equal, round, and reactive to light. Conjunctivae and EOM are normal.  Neck: Normal range of motion. Neck supple.  Cardiovascular: Normal rate, regular rhythm and normal heart sounds.   Pulmonary/Chest: Effort normal and breath sounds normal.  Abdominal: Soft. Bowel sounds are normal. He exhibits no mass. There is tenderness.  Inguinal   Genitourinary: Rectum normal and penis normal. Prostate is tender. Prostate is not enlarged.  Musculoskeletal: Normal range of motion.  Lymphadenopathy:       Right: Inguinal adenopathy present.  Neurological: He is alert and oriented to person, place, and time. Gait normal.  Skin: Skin is warm, dry and intact.  Psychiatric: Affect normal.     CMP Latest Ref Rng & Units 02/16/2017  Glucose 65 - 99 mg/dL 99  BUN 6 - 20 mg/dL 15  Creatinine 0.61 - 1.24 mg/dL 1.35(H)  Sodium 135 - 145 mmol/L 132(L)  Potassium 3.5 - 5.1 mmol/L 4.7  Chloride 101 - 111 mmol/L 99(L)  CO2 22 - 32 mmol/L 28  Calcium 8.9 - 10.3 mg/dL 10.8(H)  Total Protein 6.5 - 8.1 g/dL 6.0(L)  Total Bilirubin 0.3 - 1.2 mg/dL 0.7  Alkaline Phos 38 - 126 U/L 114  AST 15 - 41 U/L 34  ALT 17 - 63 U/L 14(L)   CBC Latest Ref Rng & Units 02/16/2017  WBC 3.8 - 10.6 K/uL 17.4(H)  Hemoglobin 13.0 - 18.0 g/dL 9.0(L)  Hematocrit 40.0 - 52.0 % 26.4(L)  Platelets 150 - 440 K/uL 54(L)    No images are attached to the encounter.  [facility] Soft  Tissue Non Vascular  Result Date: 02/16/2017 CLINICAL DATA:  Acute right inguinal pain. EXAM: ULTRASOUND right LOWER EXTREMITY LIMITED TECHNIQUE: Ultrasound examination of the lower extremity soft tissues was performed in the area of clinical concern. COMPARISON:  None. FINDINGS: There is no definite evidence of mass, cyst, fluid collection or hernia seen in the area of palpable concern in right inguinal region. IMPRESSION: No sonographic abnormality seen in area of palpable concern in right inguinal region. Electronically Signed   By: Marijo Conception, M.D.   On: 02/16/2017 12:35     Assessment and plan- Patient is a 76 y.o. male who presents for right-sided groin pain. Right-sided horizontal inguinal adenopathy observed and palpated. Right sided tenderness and small mass identified on vertical inguinal region when examined. He denies left sided involvement at this time. Prostate exam revealed a tender, non-enlarged prostate. Labs reveal an elevated WBC (17.1) and ANC (15.6).   1. STAT ultrasound of right lower extremity and soft tissue: Results are negative for mass, cyst, fluid collection or hernia.  2. Right sided inguinal pain: Encouraged patient to take Oxycodone 5-325 for pain relief PRN.  3. Tender Prostate: Refer to PCP for treatment. Sent in box message to Dr. Sanda Klein (pt's PCP) for follow-up and further evalution. Routed note to Dr. Sanda Klein.  4. RTC as scheduled to see Dr. Mike Gip on 8/30 for cycle #2 gemcitabine and oxaliplatin + obinutuzumab and labs.    Visit Diagnosis 1. Inguinal pain, right     Patient expressed understanding and was in agreement with this plan. He also understands that He can call clinic at any  time with any questions, concerns, or complaints.    Marisue Humble [facility] Pager- [phone] 02/16/2017 2:23 PM

## 2017-02-16 NOTE — ED Provider Notes (Signed)
[facility] Emergency Department Provider Note   ____________________________________________   First MD Initiated Contact with Patient 02/16/17 2316     (approximate)  I have reviewed the triage vital signs and the nursing notes.   HISTORY  Chief Complaint Prostate Check    HPI Matthew Brown is a 76 y.o. male who presents to the ED from home with a chief complaint of prostate pain.patient has a history of posttransplant lymphoproliferative disorder,stage IVBE diffuse large B-cell lymphoma who notes pain to his prostate and right pelvis for the past week. He was seen yesterday at his oncologist office who massaged his prostate and obtained ultrasound of right groin. Wife tells me he was later called with normal ultrasound results and instructed to proceed to the ED for a "test". States his oncologist was to call the ER with instructions. Patient tells me he has had "prostate problems" off and on or the years. Denies recent fever, chills, chest pain, shortness of breath, nausea, vomiting, dysuria, diarrhea. Denies pain with bowel movements. Denies recent travel or trauma. Nothing makes his pain better or worse.   Past Medical History:  Diagnosis Date  . Benign prostatic hypertrophy   . Chronic headache 10/19/2015  . ED (erectile dysfunction)   . End stage renal disease ([place])   . Essential hypertension   . GERD (gastroesophageal reflux disease)   . GIB (gastrointestinal bleeding)    a. 06/3084 s/p R colic artery embolization;  b. 02/2015 EGD: duod ulcerative mass->Bx notable for coagulative necrosis - ? ischemia vs thrombosis-->coumadin d/c'd.  . Gout   . Hearing loss   . Hemorrhoids   . Hyperlipidemia   . Lymphoma ([place])   . Lymphoma ([place]) 2017  . Multiple thyroid nodules 06/06/2016   Noted on carotid US; dedicated US to be ordered by staff  . Osteoarthrosis, unspecified whether generalized or localized, lower leg   . Persistent atrial fibrillation ([place])     a. CHA2DS2VASc = 3-->coumadin d/c'd 02/2015 2/2 recurrent GIB.  [name] Prostatitis   . Pulmonary hypertension ([place])    a. 10/2014 Echo: EF 60-65%, mild to mod MR, mildly dil LA, nl RV, PASP 84mmHg.  [name] Renal transplant recipient   . Ulcers of both great toes [facility])     Patient Active Problem List   Diagnosis Date Noted  . Thrombocytopenia ([place]) 02/14/2017  . Chemotherapy-induced neutropenia ([place]) 02/14/2017  . Malignant neoplasm metastatic to lung ([place]) 02/03/2017  . 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 Diffuse large B-cell lymphoma of extranodal site excluding spleen and other solid organs ([place]) 01/30/2017  . Encounter for antineoplastic immunotherapy 09/03/2016  . Encounter for anticoagulation discussion and counseling 08/31/2016  . Acute upper GI bleed   . Cancer ([place])   . Palliative care by specialist   . DNR (do not resuscitate) discussion   . Goals of care, counseling/discussion   . GIB (gastrointestinal bleeding) 07/08/2016  . Encounter for antineoplastic chemotherapy 07/07/2016  . Gastroenteritis 06/25/2016  . Multiple thyroid nodules 06/06/2016  . Liver metastases ([place]) 05/26/2016  . Bone metastasis ([place]) 05/26/2016  . Non-intractable vomiting with nausea   . Diarrhea   . Neutropenic fever ([place]) 05/18/2016  . Carotid atherosclerosis, bilateral 05/09/2016  . Protein-calorie malnutrition, severe 05/08/2016  . Hypercalcemia 05/06/2016  . Renal transplant recipient 05/06/2016  . Post-transplant lymphoproliferative disorder ([place]) 05/06/2016  . Tumor lysis syndrome 05/06/2016  . High serum parathyroid hormone (PTH) 05/03/2016  . Pain in joint involving pelvic region and thigh 05/03/2016  .  Diffuse large B cell lymphoma ([place]) 04/30/2016  . Abnormal positron emission tomography (PET) scan   . Anemia 04/29/2016  . Lymphoma ([place]) 04/28/2016  . Pneumothorax 04/28/2016  . Hyponatremia 04/28/2016  . Adenopathy 04/15/2016  . Bone disease 04/15/2016  . Weight loss 04/15/2016    . Spinal stenosis of cervical region 11/21/2015  . Pleural effusion, right 11/21/2015  . Right groin pain 10/19/2015  . Chronic headache 10/19/2015  . Hip strain 10/09/2015  . BPH with obstruction/lower urinary tract symptoms 09/03/2015  . Prostatitis, chronic 09/03/2015  . Erectile dysfunction of organic origin 09/03/2015  . Difficulty urinating 08/14/2015  . Accumulation of fluid in tissues 07/18/2015  . Persistent atrial fibrillation ([place])   . Pain in finger of right hand 06/15/2015  . Eustachian tube dysfunction, left 06/06/2015  . Chronic insomnia 06/06/2015  . H/O: upper GI bleed 06/06/2015  . Pleural cavity effusion 04/07/2015  . Abdominal pain, generalized 04/04/2015  . Pneumonia due to Haemophilus influenzae ([place]) 03/16/2015  . Encounter for therapeutic drug monitoring 07/27/2013  . Long term (current) use of anticoagulants 09/25/2010  . Hyperlipidemia 10/25/2008  . Essential hypertension 10/25/2008  . ATRIAL FIBRILLATION 10/25/2008  . GERD 10/25/2008  . RENAL FAILURE, END STAGE 10/25/2008  . Osteoarthrosis, unspecified whether generalized or localized, involving lower leg 10/25/2008  . BENIGN PROSTATIC HYPERTROPHY, HX OF 10/25/2008    Past Surgical History:  Procedure Laterality Date  . BACK SURGERY    . ESOPHAGOGASTRODUODENOSCOPY  03/13/15   severe esophagitis, ulcerated mass  . ESOPHAGOGASTRODUODENOSCOPY (EGD) WITH PROPOFOL N/A 07/09/2016   Procedure: ESOPHAGOGASTRODUODENOSCOPY (EGD) WITH PROPOFOL;  Surgeon: [name], MD;  Location: [facility] ENDOSCOPY;  Service: Endoscopy;  Laterality: N/A;  . HERNIA REPAIR  1974  . PERIPHERAL VASCULAR CATHETERIZATION N/A 05/07/2016   Procedure: Glori Luis Cath Insertion;  Surgeon: Algernon Huxley, MD;  Location: [facility];  Service: Cardiovascular;  Laterality: N/A;  . PROSTATE ABLATION    . STOMACH SURGERY     blood vessel burst  . THROAT SURGERY    . TOTAL KNEE ARTHROPLASTY      Prior to Admission medications   Medication  Sig Start Date End Date Taking? Authorizing Provider  acetaminophen (TYLENOL) 325 MG tablet Take 2 tablets (650 mg total) by mouth every 6 (six) hours as needed for mild pain (or Fever >/= 101). 05/13/16   Gouru, Illene Silver, MD  albuterol (PROAIR HFA) 108 (90 BASE) MCG/ACT inhaler Inhale 1-2 puffs into the lungs every 4 (four) hours as needed.  02/20/14   [provider]  allopurinol (ZYLOPRIM) 100 MG tablet Take 100 mg by mouth daily.      [provider]  COLCRYS 0.6 MG tablet Take 1 tablet by mouth 2 (two) times daily as needed. 05/03/16   [provider]  diphenhydrAMINE (BENADRYL) 25 mg capsule Take 1 capsule (25 mg total) by mouth at bedtime as needed for sleep. 05/13/16   Gouru, Illene Silver, MD  feeding supplement, ENSURE ENLIVE, (ENSURE ENLIVE) LIQD Take 237 mLs by mouth 3 (three) times daily between meals. 05/13/16   Nicholes Mango, MD  finasteride (PROSCAR) 5 MG tablet Take 1 tablet (5 mg total) by mouth daily. 09/02/16   Zara Council A, PA-C  furosemide (LASIX) 20 MG tablet Take 20 mg by mouth every other day. Take 1-2 tablets daily. 07/22/16 07/22/17  [provider]  hydroxypropyl methylcellulose (ISOPTO TEARS) 2.5 % ophthalmic solution Place 1 drop into both eyes as needed.     [provider]  metoprolol tartrate (LOPRESSOR) 25 MG tablet Take 12.5 mg by mouth 2 (two) times daily.  11/04/16 11/04/17  [provider]  Multiple Vitamin (MULTIVITAMIN) tablet Take 1 tablet by mouth daily.      [provider]  ondansetron (ZOFRAN) 4 MG tablet Take 1 tablet (4 mg total) by mouth every 6 (six) hours as needed for nausea. Patient not taking: Reported on 01/16/2017 05/22/16   Vaughan Basta, MD  oxybutynin (DITROPAN-XL) 5 MG 24 hr tablet Take 1 tablet (5 mg total) by mouth daily. 09/02/16   McGowan, Hunt Oris, PA-C  oxyCODONE-acetaminophen (ROXICET) 5-325 MG tablet Take 1 tablet by mouth every 6 (six) hours as needed for severe pain. 01/29/17    Lequita Asal, MD  pantoprazole (PROTONIX) 40 MG tablet TAKE 1 TABLET BY MOUTH TWICE A DAY 01/07/17   Corcoran, Lenna Sciara C, MD  pantoprazole (PROTONIX) 40 MG tablet TAKE 1 TABLET BY MOUTH TWICE A DAY 02/03/17   Lequita Asal, MD  predniSONE (DELTASONE) 20 MG tablet as directed.  07/27/16   [provider]  predniSONE (DELTASONE) 5 MG tablet Take 5 mg by mouth daily.  11/23/13   [provider]  simethicone (MYLICON) 80 MG chewable tablet Chew 1 tablet (80 mg total) by mouth every 6 (six) hours as needed for flatulence. 06/28/16   Loletha Grayer, MD  sucralfate (CARAFATE) 1 g tablet Take 1 tablet (1 g total) by mouth 4 (four) times daily. Resume taking after one week- once finished taking oral levaquine. ( to avoid interaction.) 05/22/16   Vaughan Basta, MD  tacrolimus (PROGRAF) 1 MG capsule Take 3 mg by mouth 2 (two) times daily. Reported on 08/16/2015    [provider]  tamsulosin (FLOMAX) 0.4 MG CAPS capsule Take 1 capsule (0.4 mg total) by mouth daily. 09/02/16   Zara Council A, PA-C  traZODone (DESYREL) 50 MG tablet 1-2 TABS AS NEEDED FOR SLEEP 07/22/16   [provider]    Allergies Patient has no known allergies.  Family History  Problem Relation Age of Onset  . Cancer Mother        throat  . Diabetes Brother   . Heart disease Brother   . Stroke Brother   . Hypertension Brother   . Diabetes Sister   . Heart disease Sister   . Hypertension Sister   . Diabetes Sister   . Diabetes Brother   . COPD Neg Hx   . Kidney disease Neg Hx   . Prostate cancer Neg Hx   . Kidney cancer Neg Hx     Social History Social History  Substance Use Topics  . Smoking status: Former Smoker    Packs/day: 1.00    Years: 25.00    Types: Cigarettes    Quit date: 06/23/1978  . Smokeless tobacco: Never Used  . Alcohol use No    Review of Systems  Constitutional: No fever/chills. Eyes: No visual changes. ENT: No sore throat. Cardiovascular:  Denies chest pain. Respiratory: Denies shortness of breath. Gastrointestinal: Positive for pain in right pelvis and inguinal region. No abdominal pain.  No nausea, no vomiting.  No diarrhea.  No constipation. Genitourinary: Positive for prostate pain. Negative for dysuria. Musculoskeletal: Negative for back pain. Skin: Negative for rash. Neurological: Negative for headaches, focal weakness or numbness.   ____________________________________________   PHYSICAL EXAM:  VITAL SIGNS: ED Triage Vitals [02/16/17 1949]  Enc Vitals Group     BP (!) 119/56     Pulse Rate 98  Resp 16     Temp 98.2 F (36.8 C)     Temp Source Oral     SpO2 99 %     Weight 125 lb (56.7 kg)     Height 5\' 9"  (1.753 m)     Head Circumference      Peak Flow      Pain Score 0     Pain Loc      Pain Edu?      Excl. in [place]?     Constitutional: Alert and oriented. Thin appearing and in no acute distress. Eyes: Conjunctivae are normal. PERRL. EOMI. Head: Atraumatic. Nose: No congestion/rhinnorhea. Mouth/Throat: Mucous membranes are moist.  Oropharynx non-erythematous. Neck: No stridor.   Cardiovascular: Normal rate, regular rhythm. Grossly normal heart sounds.  Good peripheral circulation. Respiratory: Normal respiratory effort.  No retractions. Lungs CTAB. Gastrointestinal: Soft and nontender. No distention. No abdominal bruits. No CVA tenderness. Genitourinary: Circumcised male. No urethral discharge. Bilaterally descended testicles which are nontender and nonswollen. Strong bilateral cremasteric reflexes. Right inguinal lymphadenopathy. No hernia. Musculoskeletal: No lower extremity tenderness nor edema.  No joint effusions. Neurologic:  Normal speech and language. No gross focal neurologic deficits are appreciated. No gait instability. Skin:  Skin is warm, dry and intact. No rash noted. Psychiatric: Mood and affect are normal. Speech and behavior are  normal.  ____________________________________________   LABS (all labs ordered are listed, but only abnormal results are displayed)  Labs Reviewed  CBC WITH DIFFERENTIAL/PLATELET - Abnormal; Notable for the following:       Result Value   WBC 16.2 (*)    RBC 3.62 (*)    Hemoglobin 9.8 (*)    HCT 29.2 (*)    RDW 20.0 (*)    Platelets 65 (*)    Neutro Abs 14.1 (*)    All other components within normal limits  COMPREHENSIVE METABOLIC PANEL - Abnormal; Notable for the following:    Glucose, Bld 140 (*)    Creatinine, Ser 1.55 (*)    Calcium 10.9 (*)    ALT 14 (*)    GFR calc non Af Amer 42 (*)    GFR calc Af Amer 48 (*)    All other components within normal limits  URINALYSIS, COMPLETE (UACMP) WITH MICROSCOPIC - Abnormal; Notable for the following:    Color, Urine YELLOW (*)    APPearance CLEAR (*)    Squamous Epithelial / LPF 0-5 (*)    All other components within normal limits   ____________________________________________  EKG  none ____________________________________________  RADIOLOGY  Ct Abdomen Pelvis Wo Contrast  Result Date: 02/17/2017 CLINICAL DATA:  Status post renal transplant with posttransplant lymphoproliferative disease and lymphoma, history of right-sided pelvic pain and groin pain EXAM: CT ABDOMEN AND PELVIS WITHOUT CONTRAST TECHNIQUE: Multidetector CT imaging of the abdomen and pelvis was performed following the standard protocol without IV contrast. COMPARISON:  PET CT 01/14/2017, CT abdomen pelvis 06/25/2016 FINDINGS: Lower chest: Re- demonstrated metastatic pulmonary nodules within the left upper and lower lobes. Left lower lobe pulmonary nodule appears increased measuring 13 mm, compared to 1 cm previously. No pleural effusion. No focal consolidation. Mild cardiomegaly. Small pericardial effusion. Hepatobiliary: Re- demonstrated multiple hypodense liver lesions, compatible with metastatic foci. These also appear increased. For example posterior right  hepatic lobe lesion estimated diameter of 3.6 cm, compared with 3 cm previously. Left hepatic lobe lesion 4.9 cm compare with 4.1 cm previously. No biliary dilatation. Possible small calcified gallstone. Lobular hypodensity adjacent to the gallbladder fossa  also appears slightly enlarged. Pancreas: Unremarkable. No pancreatic ductal dilatation or surrounding inflammatory changes. Spleen: Hypodense masses in the spleen consistent with metastatic foci. These also appear increased in size. For example superior lesion measures 2.4 cm compared to 2.1 cm previously. Adrenals/Urinary Tract: The adrenal glands are within normal limits. Atrophic native kidneys without hydronephrosis. Intrarenal vascular calcification. Transplanted right lower quadrant kidney, without hydronephrosis. Bladder unremarkable. Stomach/Bowel: Slightly enlarged stomach containing contrast. No dilated small bowel. Colon diverticular disease. No acute wall thickening. Normal appendix. Vascular/Lymphatic: Extensive atherosclerotic calcifications of the aorta. Non aneurysmal. Bulky masses/adenopathy re- demonstrated within the retroperitoneum and mesentery. Retrocaval soft tissue mass measures 6 x 3.1 cm, compared with 5.8 x 3.3 cm previously. Left periaortic mass/adenopathy measures 2.4 cm compared with 2.3 cm. Multiple solid masses within the central and right abdominal mesentery also appears slightly increased in size. Index lesion in the pelvis measures 5.7 cm compared with 4.5 cm previously. Reproductive: Enlarged prostate with mass effect on the bladder Other: Negative for free air or free fluid. Musculoskeletal: Postsurgical changes of the lumbosacral spine with extensive degenerative changes. Scattered lucent lesions within L3, the sacrum, and pelvis are unchanged and were not hypermetabolic on recent PET-CT. IMPRESSION: 1. Slight increased size of left lower lobe pulmonary metastatic nodule. 2. Re- demonstrated hypodense liver masses and  splenic masses, consistent with metastatic disease, suspect increased in size compared to prior PET-CT. Bulky mesenteric and retroperitoneal masses consistent with metastatic disease, also increased in size compared to recent PET-CT. 3. Atrophic native kidneys. Transplant kidney in the right lower quadrant. Negative for hydronephrosis Electronically Signed   By: Donavan Foil M.D.   On: 02/17/2017 03:00   [place] Rt Lower Extrem Ltd Soft Tissue Non Vascular  Result Date: 02/16/2017 CLINICAL DATA:  Acute right inguinal pain. EXAM: ULTRASOUND right LOWER EXTREMITY LIMITED TECHNIQUE: Ultrasound examination of the lower extremity soft tissues was performed in the area of clinical concern. COMPARISON:  None. FINDINGS: There is no definite evidence of mass, cyst, fluid collection or hernia seen in the area of palpable concern in right inguinal region. IMPRESSION: No sonographic abnormality seen in area of palpable concern in right inguinal region. Electronically Signed   By: Marijo Conception, M.D.   On: 02/16/2017 12:35    ____________________________________________   PROCEDURES  Procedure(s) performed: None  Procedures  Critical Care performed: No  ____________________________________________   INITIAL IMPRESSION / ASSESSMENT AND PLAN / ED COURSE  Pertinent labs & imaging results that were available during my care of the patient were reviewed by me and considered in my medical decision making (see chart for details).  76 year old male with lymphoproliferative disorder presenting with prostate pain and right pelvic pain for the past week. Refer to the ED by his oncologist for "testing". Unfortunately I am not able to find a record of the oncologist instructions, nor has the charge nurse been apprised. I suspect patient was referred for CT scan. Laboratory and urinalysis results are stable from prior. Will proceed with CT scan with oral contrast only to evaluate for prostatitis, intra-abdominal  infection.  Clinical Course as of Feb 18 324  Tue Feb 17, 2017  0223 Hemoglobin: (!) 9.8 [PM]  0248 Patient and spouse both sleeping. Awaiting results of CT abdomen.  [JS]  [id number] Updated patient and spouse of CT imaging results. CT does not show acute etiology for patient's pain; rather it appears that his mesenteric and retroperitoneal masses consistent with metastatic disease are increased in size compared to recent PET scan.  Will add urine culture, but does not appear patient has prostatitis requiring antibiotics. Patient will be seen closely by his oncologist for close follow-up. Strict return precautions given. Both patient and wife verbalize understanding and agree with plan of care.  [JS]    Clinical Course User Index [JS] Paulette Blanch, MD [PM] Nena Polio, MD     ____________________________________________   FINAL CLINICAL IMPRESSION(S) / ED DIAGNOSES  Final diagnoses:  Prostate pain  Pelvic pain in male      NEW MEDICATIONS STARTED DURING THIS VISIT:  New Prescriptions   No medications on file     Note:  This document was prepared using Dragon voice recognition software and may include unintentional dictation errors.    Paulette Blanch, MD 02/17/17 [phone]

## 2017-02-17 ENCOUNTER — Telehealth: Payer: Self-pay | Admitting: *Deleted

## 2017-02-17 ENCOUNTER — Telehealth: Payer: Self-pay | Admitting: Family Medicine

## 2017-02-17 ENCOUNTER — Telehealth: Payer: Self-pay

## 2017-02-17 ENCOUNTER — Emergency Department: Payer: Medicare HMO

## 2017-02-17 NOTE — Telephone Encounter (Signed)
I am happy to see him, but the last time he was complaining about his prostate pain it was found he had a hernia.

## 2017-02-17 NOTE — Telephone Encounter (Signed)
-----   Message from Arnetha Courser, MD sent at 02/16/2017  5:00 PM EDT ----- Regarding: I'll be glad to see him, schedule appt I'll be glad to see him; please schedule appt; thank you!  ----- Message ----- From: Jacquelin Hawking, NP Sent: 02/16/2017   1:51 PM To: Arnetha Courser, MD  Dr. Sanda Klein,   We saw [name] today for right sided groin pain R/O lymphadenopathy given his Oncology history and we did a rectal exam as well. He exhibited a lot of tenderness when performing the exam although his prostate did not feel enlarged. Looks like you saw him back in November of last year when he was sent to [place]. Dr. Mike Gip does not feel comfortable treating him for prostatitis and I was wondering if we could send him to you for evaluation? Oddly enough we did a ultrasound of pelvis and no hernia, mass or cyst was identified.I am not sure what is causing the pain. I was thinking these were two separate issues but I am now stumped. He is following up with Dr. Mike Gip on Thursday for chemo. Thanks so much for your help.   Thanks,   Rulon Abide, NP

## 2017-02-17 NOTE — Telephone Encounter (Signed)
Pt called stating his prostate is bothering him really bad. Pt stated that his prostate was hurting him so bad that he went to the ER last night. Pt stated "The ER did not do anything for me. I stayed in there all night for nothing." Pt stated that he has been walking the floors trying to get relief. Pt wanted to be added on for today. Made pt aware [facility] did not have any openings for today but would see about later in the week.  Your next open slot is 02/24/17. If you see him this week itll be a double book. Please advise.

## 2017-02-17 NOTE — Discharge Instructions (Signed)
Continue to take your pain medicine as directed by your cancer doctor. Return to the ER for worsening symptoms, persistent vomiting, difficulty breathing or other concerns.

## 2017-02-17 NOTE — ED Notes (Signed)
Ct informed patient done with contrast

## 2017-02-17 NOTE — ED Notes (Signed)
Pt returned from CT °

## 2017-02-17 NOTE — Telephone Encounter (Signed)
Called patient and he states he has to see the cancer doctor today and they feel better seeing him/her first before scheduling here. Pt states he will call once he sees this doctor

## 2017-02-17 NOTE — Telephone Encounter (Signed)
Patient's wife called and reported that the patient was in a lot of pain, unable to eat and felt as if his heart was racing, reports that his lips were white and stinging.  Spoke with the patient and he denies chest pain, sob or other symptoms.  Discussed with Dr. Mike Gip and she instructed to go to the ED.  Pt and wife was informed to go to the ED now for evaluation, Dr. Mike Gip called and spoke with ED physician about the patient. Pt and wife voiced understanding.

## 2017-02-18 ENCOUNTER — Ambulatory Visit (INDEPENDENT_AMBULATORY_CARE_PROVIDER_SITE_OTHER): Payer: Medicare HMO | Admitting: Urology

## 2017-02-18 ENCOUNTER — Encounter: Payer: Self-pay | Admitting: Urology

## 2017-02-18 ENCOUNTER — Telehealth: Payer: Self-pay | Admitting: General Surgery

## 2017-02-18 VITALS — BP 127/63 | HR 64 | Temp 97.7°F | Ht 67.0 in | Wt 113.8 lb

## 2017-02-18 DIAGNOSIS — K4090 Unilateral inguinal hernia, without obstruction or gangrene, not specified as recurrent: Secondary | ICD-10-CM

## 2017-02-18 DIAGNOSIS — R1031 Right lower quadrant pain: Secondary | ICD-10-CM

## 2017-02-18 DIAGNOSIS — Z940 Kidney transplant status: Secondary | ICD-10-CM | POA: Diagnosis not present

## 2017-02-18 DIAGNOSIS — C8330 Diffuse large B-cell lymphoma, unspecified site: Secondary | ICD-10-CM

## 2017-02-18 LAB — URINALYSIS, COMPLETE
BILIRUBIN UA: NEGATIVE
GLUCOSE, UA: NEGATIVE
Ketones, UA: NEGATIVE
LEUKOCYTES UA: NEGATIVE
Nitrite, UA: NEGATIVE
PROTEIN UA: NEGATIVE
RBC UA: NEGATIVE
SPEC GRAV UA: 1.025 (ref 1.005–1.030)
Urobilinogen, Ur: 1 mg/dL (ref 0.2–1.0)
pH, UA: 5 (ref 5.0–7.5)

## 2017-02-18 NOTE — Progress Notes (Signed)
Matthew Brown is a 76 y.o. male with post-transplant lymphoproliferative disorder, stage IVBE diffuse large B cell lymphoma, who is seen for 1 week assessment prior to cycle #2 gemcitabine and oxaliplatin + obinutuzumab.  HPI:  The patient was last seen in the medical oncology symptom management clinic on 02/16/2017 by Faythe Casa, NP.  At that time, he complained of right sided groin pain rated 8/10. Patient did not have any signs and symptoms of urinary infection.  Ultrasound of the area revealed no evidence of mass, cyst, fluid collection, or hernia.  He had a tender prostate on exam.  He was encouraged to use his Percocet as needed for pain relief.  He presented to the [facility] ER after being seen in the clinic on 02/16/2017.  Abdomen and pelvic CT revealed an enlarged prostate with mass effect on the bladder.  The previously demonstrated left lower lobe pulmonary nodule, liver masses, splenic masses, mesenteric and retroperitoneal masses all had increased in size as compared to the PET scan on 01/14/2017 (treatment started 02/04/2017). There was no concern prostatitis. Labs revealed a hematocrit of 29.2, hemoglobin 9.8, platelet count 65,000. WBC was 16,200 with an Muddy of 8400. Creatinine  Was 1.55 (CrC 48 ml/min, calcium 10.9. Urinalysis was negative for blood an infection   On 02/17/2017, he called the office with complaints of increased pain.  After speaking with PCP, Faythe Casa, NP advised patient to follow up with [facility] (urology). Patient was seen by [facility] on 02/18/2017.  He was referred to Dr. Jamal Collin for evaluation of a questionable RIGHT inguinal hernia.   Symptomatically, he is feeling good overall. He has continued RIGHT groin pain.  Pain is constant and rated 7/10.   He denies urinary symptoms and fever. Of note, patient has a known left inguinal hernia that is reducible.  Pain has  caused him to have a poor appetite and weight loss of 6 pounds since his last visit.    Past Medical History:  Diagnosis Date  . Benign prostatic hypertrophy   . Chronic headache 10/19/2015  . ED (erectile dysfunction)   . End stage renal disease ([place])   . Essential hypertension   . GERD (gastroesophageal reflux disease)   . GIB (gastrointestinal bleeding)    a. 11/2861 s/p R colic artery embolization;  b. 02/2015 EGD: duod ulcerative mass->Bx notable for coagulative necrosis - ? ischemia vs thrombosis-->coumadin d/c'd.  . Gout   . Hearing loss   . Hemorrhoids   . Hyperlipidemia   . Lymphoma ([place])   . Lymphoma ([place]) 2017  . Multiple thyroid nodules 06/06/2016   Noted on carotid US; dedicated US to be ordered by staff  . Osteoarthrosis, unspecified whether generalized or localized, lower leg   . Persistent atrial fibrillation ([place])    a. CHA2DS2VASc = 3-->coumadin d/c'd 02/2015 2/2 recurrent GIB.  [name] Prostatitis   . Pulmonary hypertension ([place])    a. 10/2014 Echo: EF 60-65%, mild to mod MR, mildly dil LA, nl RV, PASP 35mHg.  .Marland KitchenRenal transplant recipient   . Ulcers of both great toes ([facility]     Past Surgical History:  Procedure Laterality Date  . BACK SURGERY    . ESOPHAGOGASTRODUODENOSCOPY  03/13/15   severe esophagitis, ulcerated mass  . ESOPHAGOGASTRODUODENOSCOPY (EGD) WITH PROPOFOL N/A 07/09/2016   Procedure: ESOPHAGOGASTRODUODENOSCOPY (EGD) WITH PROPOFOL;  Surgeon: KJonathon Bellows MD;  Location: [facility] ENDOSCOPY;  Service: Endoscopy;  Laterality: N/A;  . HERNIA REPAIR  1974  . PERIPHERAL VASCULAR CATHETERIZATION N/A 05/07/2016   Procedure: Glori Luis Cath Insertion;  Surgeon: Algernon Huxley, MD;  Location: [place] CV LAB;  Service: Cardiovascular;  Laterality: N/A;  . PROSTATE ABLATION    . STOMACH SURGERY     blood vessel burst  . THROAT SURGERY    . TOTAL KNEE ARTHROPLASTY      Family History  Problem Relation Age of Onset  . Cancer Mother        throat  . Diabetes Brother    . Heart disease Brother   . Stroke Brother   . Hypertension Brother   . Diabetes Sister   . Heart disease Sister   . Hypertension Sister   . Diabetes Sister   . Diabetes Brother   . COPD Neg Hx   . Kidney disease Neg Hx   . Prostate cancer Neg Hx   . Kidney cancer Neg Hx   . Bladder Cancer Neg Hx     Social History:  reports that he quit smoking about 38 years ago. His smoking use included Cigarettes. He has a 25.00 pack-year smoking history. He has never used smokeless tobacco. He reports that he does not drink alcohol or use drugs.  He stopped smoking in 1981.  He smoked 3 cigarettes/day.  He lives in [place].  The patient is accompanied by his wife, [name],  today.  Allergies: No Known Allergies  Current Medications: Current Outpatient Prescriptions  Medication Sig Dispense Refill  . acetaminophen (TYLENOL) 325 MG tablet Take 2 tablets (650 mg total) by mouth every 6 (six) hours as needed for mild pain (or Fever >/= 101).    [name] albuterol (PROAIR HFA) 108 (90 BASE) MCG/ACT inhaler Inhale 1-2 puffs into the lungs every 4 (four) hours as needed.     [name] allopurinol (ZYLOPRIM) 100 MG tablet Take 100 mg by mouth daily.      [name] COLCRYS 0.6 MG tablet Take 1 tablet by mouth 2 (two) times daily as needed.    . diphenhydrAMINE (BENADRYL) 25 mg capsule Take 1 capsule (25 mg total) by mouth at bedtime as needed for sleep. 30 capsule 0  . feeding supplement, ENSURE ENLIVE, (ENSURE ENLIVE) LIQD Take 237 mLs by mouth 3 (three) times daily between meals. 90 Bottle 0  . finasteride (PROSCAR) 5 MG tablet Take 1 tablet (5 mg total) by mouth daily. 90 tablet 3  . furosemide (LASIX) 20 MG tablet Take 20 mg by mouth every other day. Take 1-2 tablets daily.    . hydroxypropyl methylcellulose (ISOPTO TEARS) 2.5 % ophthalmic solution Place 1 drop into both eyes as needed.     . metoprolol tartrate (LOPRESSOR) 25 MG tablet Take 12.5 mg by mouth 2 (two) times daily.     . Multiple Vitamin (MULTIVITAMIN)  tablet Take 1 tablet by mouth daily.      [name] oxybutynin (DITROPAN-XL) 5 MG 24 hr tablet Take 1 tablet (5 mg total) by mouth daily. 90 tablet 3  . pantoprazole (PROTONIX) 40 MG tablet TAKE 1 TABLET BY MOUTH TWICE A DAY 60 tablet 1  . predniSONE (DELTASONE) 20 MG tablet as directed.     . predniSONE (DELTASONE) 5 MG tablet Take 5 mg by mouth daily.     . simethicone (MYLICON) 80 MG chewable tablet Chew 1 tablet (80 mg total) by mouth every 6 (six) hours as needed for flatulence. 120 tablet 0  . sucralfate (CARAFATE) 1 g tablet Take 1  tablet (1 g total) by mouth 4 (four) times daily. Resume taking after one week- once finished taking oral levaquine. ( to avoid interaction.) 40 tablet 3  . tacrolimus (PROGRAF) 1 MG capsule Take 3 mg by mouth 2 (two) times daily. Reported on 08/16/2015    . tamsulosin (FLOMAX) 0.4 MG CAPS capsule Take 1 capsule (0.4 mg total) by mouth daily. 90 capsule 3  . traZODone (DESYREL) 50 MG tablet 1-2 TABS AS NEEDED FOR SLEEP  3  . ondansetron (ZOFRAN) 4 MG tablet Take 1 tablet (4 mg total) by mouth every 6 (six) hours as needed for nausea. (Patient not taking: Reported on 01/16/2017) 20 tablet 0  . oxyCODONE-acetaminophen (ROXICET) 5-325 MG tablet Take 1 tablet by mouth every 6 (six) hours as needed for severe pain. (Patient not taking: Reported on 02/18/2017) 20 tablet 0  . pantoprazole (PROTONIX) 40 MG tablet TAKE 1 TABLET BY MOUTH TWICE A DAY (Patient not taking: Reported on 02/18/2017) 60 tablet 1   No current facility-administered medications for this visit.    Facility-Administered Medications Ordered in Other Visits  Medication Dose Route Frequency Provider Last Rate Last Dose  . heparin lock flush 100 unit/mL  500 Units Intravenous Once Corcoran, Melissa C, MD      . sodium chloride flush (NS) 0.9 % injection 10 mL  10 mL Intracatheter PRN Corcoran, Melissa C, MD      . sodium chloride flush (NS) 0.9 % injection 10 mL  10 mL Intravenous PRN Lequita Asal, MD   10 mL  at 02/19/17 0831    Review of Systems:  GENERAL:  Feels "good".  No fevers or sweats.  Weight down 6 pounds since last visit.  PERFORMANCE STATUS (ECOG):  1 HEENT:  No visual changes, sore throat, mouth sores or tenderness. Lungs: No shortness of breath or cough.  No hemoptysis. Cardiac:  No chest pain, palpitations, orthopnea, or PND. GI: Eating poorly secondary to pain.  No nausea, vomiting, diarrhea, constipation, melena or hematochezia. GU:  Enlarged prostate.  No urgency, frequency, dysuria, or hematuria.  Left inguinal hernia. Musculoskeletal: No back pain.  No joint pain.  No muscle tenderness. Extremities:  No pain or swelling. Skin:  Nail changes.  No rashes or skin changes. Neuro:  No headache, numbness or weakness, balance or coordination issues. Endocrine:  No diabetes, thyroid issues, hot flashes or night sweats. Psych:  No mood changes, depression or anxiety. Pain:  Right groin pain (7 out of 10). Review of systems:  All other systems reviewed and found to be negative.  Physical Exam: Blood pressure 134/61, pulse (!) 56, temperature (!) 97.2 F (36.2 C), temperature source Tympanic, resp. rate 18, weight 117 lb 6 oz (53.2 kg). GENERAL:  Thin elderly gentleman sitting comfortably in the exam room in no acute distress. He has a cane at his side. MENTAL STATUS:  Alert and oriented to person, place and time. HEAD:  Lu Duffel.  Temporal wasting.  Normocephalic, atraumatic, face symmetric, no Cushingoid features. EYES:  Glasses.  Brown eyes.  Pupils equal round and reactive to light and accomodation.  No conjunctivitis or scleral icterus. ENT:  Oropharynx clear without lesion.  Edentulous.  Tongue normal. Mucous membranes moist.  RESPIRATORY:  Clear to auscultation without rales, wheezes or rhonchi. CARDIOVASCULAR:  Regular rate and rhythm without murmur, rub or gallop. ABDOMEN:  Soft, non-tender, with active bowel sounds, and no hepatosplenomegaly.  No masses.  Palpable  transplanted kidney.  GROIN:  Equisite pain on palpation right  inguinal area near scrotum.  No erythema or induration.  No guarding or rebound tenderness SKIN:  No rashes, ulcers or lesions. EXTREMITIES:  No edema, no skin discoloration or tenderness.  Right upper extremity with dilated vascular s/p AV fistula (chronic).  No palpable cords. NEUROLOGICAL: Unremarkable. PSYCH:  Appropriate.   Imaging studies:  04/28/2016 PET scan:  Bulky intensely hypermetabolic periaortic upper abdominal and mesenteric adenopathy concerning for high-grade lymphoma.  There was hypermetabolic liver metastasis.  There was hpermetabolic lesion involving the small bowel of the upper pelvis.  There were multiple sites of hypermetabolic skeletal metastasis. There was moderate volume right pneumothorax. 08/12/2016 PET scan:  Interval response to therapy. There has been significant decrease in extent of hypermetabolic tumor within the neck, chest, abdomen and pelvis as well as the axial and appendicular skeleton.  There was residual enlarged and hypermetabolic small bowel mesenteric and periaortic lymph nodes. There was a persistent hypermetabolic focus of increased uptake within the spleen.  There was resolution of previous multifocal hypermetabolic lesions within the liver. 10/10/2016 PET scan:  Slight worsening compared to the prior study. Several lesions were slightly more hypermetabolic and there appeared to be a newly enlarged and hypermetabolic mesenteric node adjacent to one of the previous mesenteric lymph nodes. However, there are no new hypermetabolic lesions in the neck, chest, or skeleton.  The small hypermetabolic peripheral lesion of the right kidney upper pole is slightly less hypermetabolic. 29/51/8841 PET scan:  Marked progression of disease as evidenced by progressive bulky mesenteric adenopathy, new low left internal jugular/left juxta diaphragmatic/abdominal retroperitoneal adenopathy, new pulmonary nodules,  new hepatic and splenic lesions and new peritoneal nodules, all of which are hypermetabolic 66/11/3014 CT abdomen and pelvis: Slight increased size of left lower lobe pulmonary metastatic nodule.Re- demonstrated hypodense liver masses and splenic masses, consistent with metastatic disease, suspect increased in size compared to prior PET-CT. Bulky mesenteric and retroperitoneal masses consistent with metastatic disease, also increased in size compared to recent PET-CT. Atrophic native kidneys. Transplant kidney in the right lower quadrant. Negative for hydronephrosis   Infusion on 02/19/2017  Component Date Value Ref Range Status  . WBC 02/19/2017 17.2* 3.8 - 10.6 K/uL Final  . RBC 02/19/2017 3.36* 4.40 - 5.90 MIL/uL Final  . Hemoglobin 02/19/2017 9.1* 13.0 - 18.0 g/dL Final  . HCT 02/19/2017 26.6* 40.0 - 52.0 % Final  . MCV 02/19/2017 79.0* 80.0 - 100.0 fL Final  . MCH 02/19/2017 27.1  26.0 - 34.0 pg Final  . MCHC 02/19/2017 34.2  32.0 - 36.0 g/dL Final  . RDW 02/19/2017 20.4* 11.5 - 14.5 % Final  . Platelets 02/19/2017 134* 150 - 440 K/uL Final  . Neutrophils Relative % 02/19/2017 89  % Final  . Neutro Abs 02/19/2017 15.2* 1.4 - 6.5 K/uL Final  . Lymphocytes Relative 02/19/2017 3  % Final  . Lymphs Abs 02/19/2017 0.6* 1.0 - 3.6 K/uL Final  . Monocytes Relative 02/19/2017 8  % Final  . Monocytes Absolute 02/19/2017 1.4* 0.2 - 1.0 K/uL Final  . Eosinophils Relative 02/19/2017 0  % Final  . Eosinophils Absolute 02/19/2017 0.0  0 - 0.7 K/uL Final  . Basophils Relative 02/19/2017 0  % Final  . Basophils Absolute 02/19/2017 0.0  0 - 0.1 K/uL Final  . Sodium 02/19/2017 132* 135 - 145 mmol/L Final  . Potassium 02/19/2017 4.3  3.5 - 5.1 mmol/L Final  . Chloride 02/19/2017 97* 101 - 111 mmol/L Final  . CO2 02/19/2017 27  22 - 32 mmol/L Final  .  Glucose, Bld 02/19/2017 86  65 - 99 mg/dL Final  . BUN 02/19/2017 22* 6 - 20 mg/dL Final  . Creatinine, Ser 02/19/2017 1.68* 0.61 - 1.24 mg/dL Final   . Calcium 02/19/2017 9.9  8.9 - 10.3 mg/dL Final  . Total Protein 02/19/2017 6.1* 6.5 - 8.1 g/dL Final  . Albumin 02/19/2017 3.7  3.5 - 5.0 g/dL Final  . AST 02/19/2017 36  15 - 41 U/L Final  . ALT 02/19/2017 12* 17 - 63 U/L Final  . Alkaline Phosphatase 02/19/2017 118  38 - 126 U/L Final  . Total Bilirubin 02/19/2017 0.9  0.3 - 1.2 mg/dL Final  . GFR calc non Af Amer 02/19/2017 38* >60 mL/min Final  . GFR calc Af Amer 02/19/2017 44* >60 mL/min Final   Comment: (NOTE) The eGFR has been calculated using the CKD EPI equation. This calculation has not been validated in all clinical situations. eGFR's persistently <60 mL/min signify possible Chronic Kidney Disease.   . Anion gap 02/19/2017 8  5 - 15 Final  . LDH 02/19/2017 525* 98 - 192 U/L Final  Office Visit on 02/18/2017  Component Date Value Ref Range Status  . Specific Gravity, UA 02/18/2017 1.025  1.005 - 1.030 Final  . pH, UA 02/18/2017 5.0  5.0 - 7.5 Final  . Color, UA 02/18/2017 Yellow  Yellow Final  . Appearance Ur 02/18/2017 Clear  Clear Final  . Leukocytes, UA 02/18/2017 Negative  Negative Final  . Protein, UA 02/18/2017 Negative  Negative/Trace Final  . Glucose, UA 02/18/2017 Negative  Negative Final  . Ketones, UA 02/18/2017 Negative  Negative Final  . RBC, UA 02/18/2017 Negative  Negative Final  . Bilirubin, UA 02/18/2017 Negative  Negative Final  . Urobilinogen, Ur 02/18/2017 1.0  0.2 - 1.0 mg/dL Final  . Nitrite, UA 02/18/2017 Negative  Negative Final  Admission on 02/16/2017, Discharged on 02/17/2017  Component Date Value Ref Range Status  . WBC 02/16/2017 16.2* 3.8 - 10.6 K/uL Final  . RBC 02/16/2017 3.62* 4.40 - 5.90 MIL/uL Final  . Hemoglobin 02/16/2017 9.8* 13.0 - 18.0 g/dL Final  . HCT 02/16/2017 29.2* 40.0 - 52.0 % Final  . MCV 02/16/2017 80.7  80.0 - 100.0 fL Final  . MCH 02/16/2017 26.9  26.0 - 34.0 pg Final  . MCHC 02/16/2017 33.3  32.0 - 36.0 g/dL Final  . RDW 02/16/2017 20.0* 11.5 - 14.5 % Final  .  Platelets 02/16/2017 65* 150 - 440 K/uL Final   PLATELET COUNT CONFIRMED BY SMEAR  . Neutrophils Relative % 02/16/2017 84  % Final  . Lymphocytes Relative 02/16/2017 8  % Final  . Monocytes Relative 02/16/2017 4  % Final  . Eosinophils Relative 02/16/2017 1  % Final  . Basophils Relative 02/16/2017 0  % Final  . Band Neutrophils 02/16/2017 0  % Final  . Metamyelocytes Relative 02/16/2017 2  % Final  . Myelocytes 02/16/2017 1  % Final  . Promyelocytes Absolute 02/16/2017 0  % Final  . Blasts 02/16/2017 0  % Final  . nRBC 02/16/2017 0  0 /100 WBC Final  . Other 02/16/2017 0  % Final  . Neutro Abs 02/16/2017 14.1* 1.4 - 6.5 K/uL Final  . Lymphs Abs 02/16/2017 1.3  1.0 - 3.6 K/uL Final  . Monocytes Absolute 02/16/2017 0.6  0.2 - 1.0 K/uL Final  . Eosinophils Absolute 02/16/2017 0.2  0 - 0.7 K/uL Final  . Basophils Absolute 02/16/2017 0.0  0 - 0.1 K/uL Final  . RBC  Morphology 02/16/2017 MIXED RBC POPULATION   Final   Comment: POLYCHROMASIA PRESENT ELLIPTOCYTES   . Sodium 02/16/2017 136  135 - 145 mmol/L Final  . Potassium 02/16/2017 4.8  3.5 - 5.1 mmol/L Final  . Chloride 02/16/2017 102  101 - 111 mmol/L Final  . CO2 02/16/2017 27  22 - 32 mmol/L Final  . Glucose, Bld 02/16/2017 140* 65 - 99 mg/dL Final  . BUN 02/16/2017 16  6 - 20 mg/dL Final  . Creatinine, Ser 02/16/2017 1.55* 0.61 - 1.24 mg/dL Final  . Calcium 02/16/2017 10.9* 8.9 - 10.3 mg/dL Final  . Total Protein 02/16/2017 6.5  6.5 - 8.1 g/dL Final  . Albumin 02/16/2017 3.9  3.5 - 5.0 g/dL Final  . AST 02/16/2017 35  15 - 41 U/L Final  . ALT 02/16/2017 14* 17 - 63 U/L Final  . Alkaline Phosphatase 02/16/2017 117  38 - 126 U/L Final  . Total Bilirubin 02/16/2017 0.8  0.3 - 1.2 mg/dL Final  . GFR calc non Af Amer 02/16/2017 42* >60 mL/min Final  . GFR calc Af Amer 02/16/2017 48* >60 mL/min Final   Comment: (NOTE) The eGFR has been calculated using the CKD EPI equation. This calculation has not been validated in all clinical  situations. eGFR's persistently <60 mL/min signify possible Chronic Kidney Disease.   . Anion gap 02/16/2017 7  5 - 15 Final  . Color, Urine 02/16/2017 YELLOW* YELLOW Final  . APPearance 02/16/2017 CLEAR* CLEAR Final  . Specific Gravity, Urine 02/16/2017 1.008  1.005 - 1.030 Final  . pH 02/16/2017 5.0  5.0 - 8.0 Final  . Glucose, UA 02/16/2017 NEGATIVE  NEGATIVE mg/dL Final  . Hgb urine dipstick 02/16/2017 NEGATIVE  NEGATIVE Final  . Bilirubin Urine 02/16/2017 NEGATIVE  NEGATIVE Final  . Ketones, ur 02/16/2017 NEGATIVE  NEGATIVE mg/dL Final  . Protein, ur 02/16/2017 NEGATIVE  NEGATIVE mg/dL Final  . Nitrite 02/16/2017 NEGATIVE  NEGATIVE Final  . Leukocytes, UA 02/16/2017 NEGATIVE  NEGATIVE Final  . RBC / HPF 02/16/2017 0-5  0 - 5 RBC/hpf Final  . WBC, UA 02/16/2017 0-5  0 - 5 WBC/hpf Final  . Bacteria, UA 02/16/2017 NONE SEEN  NONE SEEN Final  . Squamous Epithelial / LPF 02/16/2017 0-5* NONE SEEN Final    Assessment:  Matthew Brown is a 76 y.o. male s/p renal transplant (2007) with a post-transplant lymphoproliferative disorder, stage IV diffuse large B cell lymphoma.  He presented with a 2-3 month history of progressive back pain superimposed on chronic back pain.    PET scan on 04/28/2016 revealed bulky intensely hypermetabolic periaortic upper abdominal and mesenteric adenopathy concerning for high-grade lymphoma.  There was hypermetabolic liver metastasis.  There was hpermetabolic lesion involving the small bowel of the upper pelvis.  There were multiple sites of hypermetabolic skeletal metastasis. There was moderate volume right pneumothorax.  CT guided retroperitoneal node biopsy on 04/30/2016 revealed diffuse large B cell lymphoma.  Hepatitis B and C testing were negative on 05/06/2016 and 02/03/2017.  Echo on 05/06/2016 revealed an EF of 55-60%.  Echo on 09/01/2016 revealed an EF of 60-65%.  Bone marrow aspirate and biopsy on 05/08/2016 revealed multifocal marrow involvement by  diffuse large B-cell lymphoma. There was variably cellular marrow for age (50% - 90%) with a patchy predominantly nodular large B-cell infiltrate, overall estimated to account for 20% of the core biopsy. There was adequate residual trilineage hematopoiesis with mild nonspecific dyserythropoiesis. There was patchy mild increase in reticulin. Storage iron was  present. The immunohistochemical staining pattern of the B-cell infiltrate (CD10 +/-, BCL 6+, BCL-2 +) suggested possible large cell transformation of follicular lymphoma.  Flow cytometry revealed no significant immunophenotypic abnormalities or evidence of B-cell lymphoma.  He has bone metastasis and hypercalcemia.  Calcium was 11.2 (ionized 7.3) on 05/13/2016.  He received Zometa on 05/13/2016.  He began [place] on 06/09/2016 (last 08/05/2016).  Work-up on 04/15/2016 revealed the following normal studies: ferritin (343), iron saturation (6%), TIBC (241; low), B12 (525), folate (27).  Reticulocyte count was 2%.  LDH was 409.  Uric acid was 7.7 (4.4 - 7.6).  He was [facility] at [facility] from 04/28/2016 - 04/30/2016 with a moderate volume right sided pneumothorax.  His pneumothorax improved spontaneously. Plain films of the right femur revealed the lucent bone lesion in the right femoral neck  was poorly characterized.  There was no evidence for an acute fracture.  PTH was 106 (high) 04/30/2016 with a calcium of 11.2.  Etiology was c/w primary hyperparathyroidism.  PTH-related polypeptide was < 1.1 on 04/30/2016.  He has a history of GI bleeding in 08/2014.  He underwent tagged RBC scan which revealed an active bleed in the hepatic flexure.  He was embolized in vascular interventional radiology.  He was admitted to [facility] from 07/08/2016 - 07/13/2016 with GI bleeding.  EGD on 07/09/2016 revealed multiple non-bleeding duodenal ulcer as well as duodenitis. Tagged RBC scan on 07/11/2016 revealed active GI bleed in the lateral right mid abdomen coursing through  multiple curvilinear bowel loops in the right mid abdomen favoring a small bowel source of bleeding.  He received 5 units of PRBCs, 2 units of pheresed platelets, and vitamin K from 07/07/2016 0 07/13/2016.  He was transferred to [facility] from 07/13/2016 - 07/16/2016.  [facility] performed a colonoscopy and push enteroscopy which showed several diverticula and old blood but no source of bleeding. Source of bleed was presumed to be diverticular, although it was possible a small bowel site was not visualized on endoscopy.  He has a history of renal failure s/p renal transplant.  He is tacrolimus (Prograf), and steroids  Creatinine has ranged between 1.43 - 1.93 in the past 6 months. Mycophenolate (MMF) was discontinued at diagnosis.  Prograf level was 5.5 (3.0-8.0) on 05/08/2016, 2.2 on 08/13/2016, 2.3 in 01/01/2017.  He received 6 cycles of mini-RCHOP (05/09/2016 - 09/05/2016).  Cycle #1 was complicated by fever and neutropenia.  Cycle #3 was complicated by a GI bleed.  CSF on 06/19/2016 revealed 7 WBCs (4% segs, 79% lymphs, and 17% monocytes).  He has undergone LP with IT MTX x 4 (07/24/2016, 08/14/2016, 09/04/2016, and 10/02/2016).  Cytology was negative on 07/24/2016 and 09/04/2016.  PET scan on 10/10/2016 revealed slight worsening compared to the prior study. Several lesions were slightly more hypermetabolic and there appeared to be a newly enlarged and hypermetabolic mesenteric node adjacent to one of the previous mesenteric lymph nodes. However, there are no new hypermetabolic lesions in the neck, chest, or skeleton.  The small hypermetabolic peripheral lesion of the right kidney upper pole is slightly less hypermetabolic.  He was lost to follow-up.  PET scan on 01/14/2017 revealed marked progression of disease as evidenced by progressive bulky mesenteric adenopathy, new low left internal jugular/left juxta diaphragmatic/abdominal retroperitoneal adenopathy, new pulmonary nodules, new hepatic and splenic  lesions and new peritoneal nodules, all of which are hypermetabolic.  LDH was 480 on 02/06/2017.  He is day 16 of cycle #1 gemcitabine and oxaliplatin + obinutuzumab (02/04/2017). Will start  day 1 of cycle # 2 today (02/19/2017).  Symptomatically, he has unexplained right inguinal pain causing decreased appetitite and weight loss.  Exam reveals exquisite pain with erythema or edema.  BUN/Cr are elevated secondary to decreased fluid intake.  Plan:  1.  Labs today: CBC with diff, CMP, uric acid, LDH. 2.  Reschedule day 1 of cycle #2 gemcitabine and oxaliplatin + obinutuzumab (No treatment today). 3.  1L NS today.   4.  Discussed anemia.  Patient asymptomatic.  Continue to observe without intervention. 5.  Discussed decrease in renal function. Patient voiding normally. Not drinking as much. Encouraged to increase oral fluid intake.  IVF today. 6.  Refer to Dr. Jamal Collin to evaluate pain in the RIGHT groin. Patient to be seen today.  7.  RTC on 02/24/2017 for MD assessment, labs (CBC with diff, CMP, uric acid), and treatment.   Addendum:  Dr Jamal Collin noted tenderness over the right spermatic cord below level of external ring (funiculitis).  There was no hernia on the right.  The patient will be referred to urology.   Honor Loh, NP  02/19/2017, 9:08 AM   I saw and evaluated the patient, participating in the key portions of the service and reviewing pertinent diagnostic studies and records.  I reviewed the nurse practitioner's note and agree with the findings and the plan.  The assessment and plan were discussed with the patient.  A few questions were asked by the patient and answered.   Lequita Asal, MD 02/19/2017,3:15 PM

## 2017-02-18 NOTE — Telephone Encounter (Signed)
I CALLED PATIENT'S  HOME # & WAS UNABLE TO L/M NO VOICE MAIL SET UP.WILL TRY AGAIN LATER.NEED TO  MAKE AN APPOINTMENT WITH DR Jamal Collin FOR INGUINAL HERNIA,REF'D BY SHANNON MCGOWAN PA-C.

## 2017-02-18 NOTE — Progress Notes (Signed)
8:[place] Aug 05, 1940 [id number]  Referring provider: Arnetha Courser, MD [address] [place], [place] [address]  Chief Complaint  Patient presents with  . Testicle Pain    HPI: 76 yo AAM who presents today requesting an urgent appointment for prostate pain.  He states for about 1 week he's been having pain in his right lower quadrant.  He states that the area can become quite painful.   7/10 pain. The pain can last for several hours.  Nothing makes the pain worse or better.  He denies any fevers, chills, nausea or vomiting. He has not had any constipation or diarrhea.    Patient was found to have a left inguinal hernia a few months ago.  This has not been giving him trouble.    He was seen at [facility] ED for the pain.  An ultrasound of the right inguinal area was performed on 02/16/2017 and no sonographic abnormality seen in area of palpable concern in right inguinal region  A non contrast CT was performed on 02/17/2017 and it found a slight increased size of left lower lobe pulmonary metastatic nodule.  Re- demonstrated hypodense liver masses and splenic masses, consistent with metastatic disease, suspect increased in size compared to prior PET-CT. Bulky mesenteric and retroperitoneal masses consistent with metastatic disease, also increased in size compared to recent PET-CT.  Atrophic native kidneys. Transplant kidney in the right lower quadrant. Negative for hydronephrosis  His UA is negative.     PMH: Past Medical History:  Diagnosis Date  . Benign prostatic hypertrophy   . Chronic headache 10/19/2015  . ED (erectile dysfunction)   . End stage renal disease ([place])   . Essential hypertension   . GERD (gastroesophageal reflux disease)   . GIB (gastrointestinal bleeding)    a. 06/9145 s/p R colic artery embolization;  b. 02/2015 EGD: duod ulcerative mass->Bx notable for coagulative necrosis - ? ischemia vs thrombosis-->coumadin d/c'd.  . Gout   . Hearing  loss   . Hemorrhoids   . Hyperlipidemia   . Lymphoma ([place])   . Lymphoma ([place]) 2017  . Multiple thyroid nodules 06/06/2016   Noted on carotid US; dedicated US to be ordered by staff  . Osteoarthrosis, unspecified whether generalized or localized, lower leg   . Persistent atrial fibrillation ([place])    a. CHA2DS2VASc = 3-->coumadin d/c'd 02/2015 2/2 recurrent GIB.  [name] Prostatitis   . Pulmonary hypertension ([place])    a. 10/2014 Echo: EF 60-65%, mild to mod MR, mildly dil LA, nl RV, PASP 54mmHg.  [name] Renal transplant recipient   . Ulcers of both great toes [facility])     Surgical History: Past Surgical History:  Procedure Laterality Date  . BACK SURGERY    . ESOPHAGOGASTRODUODENOSCOPY  03/13/15   severe esophagitis, ulcerated mass  . ESOPHAGOGASTRODUODENOSCOPY (EGD) WITH PROPOFOL N/A 07/09/2016   Procedure: ESOPHAGOGASTRODUODENOSCOPY (EGD) WITH PROPOFOL;  Surgeon: [name], MD;  Location: [facility] ENDOSCOPY;  Service: Endoscopy;  Laterality: N/A;  . HERNIA REPAIR  1974  . PERIPHERAL VASCULAR CATHETERIZATION N/A 05/07/2016   Procedure: Glori Luis Cath Insertion;  Surgeon: Algernon Huxley, MD;  Location: [facility] LAB;  Service: Cardiovascular;  Laterality: N/A;  . PROSTATE ABLATION    . STOMACH SURGERY     blood vessel burst  . THROAT SURGERY    . TOTAL KNEE ARTHROPLASTY      Home Medications:  Allergies as of 02/18/2017   No Known Allergies  Medication List       Accurate as of 02/18/17 11:59 PM. Always use your most recent med list.          acetaminophen 325 MG tablet Commonly known as:  TYLENOL Take 2 tablets (650 mg total) by mouth every 6 (six) hours as needed for mild pain (or Fever >/= 101).   allopurinol 100 MG tablet Commonly known as:  ZYLOPRIM Take 100 mg by mouth daily.   COLCRYS 0.6 MG tablet Generic drug:  colchicine Take 1 tablet by mouth 2 (two) times daily as needed.   diphenhydrAMINE 25 mg capsule Commonly known as:  BENADRYL Take 1 capsule (25 mg total) by  mouth at bedtime as needed for sleep.   feeding supplement (ENSURE ENLIVE) Liqd Take 237 mLs by mouth 3 (three) times daily between meals.   finasteride 5 MG tablet Commonly known as:  PROSCAR Take 1 tablet (5 mg total) by mouth daily.   furosemide 20 MG tablet Commonly known as:  LASIX Take 20 mg by mouth every other day. Take 1-2 tablets daily.   hydroxypropyl methylcellulose 2.5 % ophthalmic solution Commonly known as:  ISOPTO TEARS Place 1 drop into both eyes as needed.   metoprolol tartrate 25 MG tablet Commonly known as:  LOPRESSOR Take 12.5 mg by mouth 2 (two) times daily.   multivitamin tablet Take 1 tablet by mouth daily.   ondansetron 4 MG tablet Commonly known as:  ZOFRAN Take 1 tablet (4 mg total) by mouth every 6 (six) hours as needed for nausea.   oxybutynin 5 MG 24 hr tablet Commonly known as:  DITROPAN-XL Take 1 tablet (5 mg total) by mouth daily.   oxyCODONE-acetaminophen 5-325 MG tablet Commonly known as:  ROXICET Take 1 tablet by mouth every 6 (six) hours as needed for severe pain.   pantoprazole 40 MG tablet Commonly known as:  PROTONIX TAKE 1 TABLET BY MOUTH TWICE A DAY   pantoprazole 40 MG tablet Commonly known as:  PROTONIX TAKE 1 TABLET BY MOUTH TWICE A DAY   predniSONE 5 MG tablet Commonly known as:  DELTASONE Take 5 mg by mouth daily.   predniSONE 20 MG tablet Commonly known as:  DELTASONE as directed.   PROAIR HFA 108 (90 Base) MCG/ACT inhaler Generic drug:  albuterol Inhale 1-2 puffs into the lungs every 4 (four) hours as needed.   simethicone 80 MG chewable tablet Commonly known as:  MYLICON Chew 1 tablet (80 mg total) by mouth every 6 (six) hours as needed for flatulence.   sucralfate 1 g tablet Commonly known as:  CARAFATE Take 1 tablet (1 g total) by mouth 4 (four) times daily. Resume taking after one week- once finished taking oral levaquine. ( to avoid interaction.)   tacrolimus 1 MG capsule Commonly known as:   PROGRAF Take 3 mg by mouth 2 (two) times daily. Reported on 08/16/2015   tamsulosin 0.4 MG Caps capsule Commonly known as:  FLOMAX Take 1 capsule (0.4 mg total) by mouth daily.   traZODone 50 MG tablet Commonly known as:  DESYREL 1-2 TABS AS NEEDED FOR SLEEP            Discharge Care Instructions        Start     Ordered   02/18/17 0000  Urinalysis, Complete     02/18/17 1348   02/18/17 0000  Ambulatory referral to General Surgery    Comments:  Right inguinal hernia   02/18/17 1450      Allergies:  No Known Allergies  Family History: Family History  Problem Relation Age of Onset  . Cancer Mother        throat  . Diabetes Brother   . Heart disease Brother   . Stroke Brother   . Hypertension Brother   . Diabetes Sister   . Heart disease Sister   . Hypertension Sister   . Diabetes Sister   . Diabetes Brother   . COPD Neg Hx   . Kidney disease Neg Hx   . Prostate cancer Neg Hx   . Kidney cancer Neg Hx   . Bladder Cancer Neg Hx     Social History:  reports that he quit smoking about 38 years ago. His smoking use included Cigarettes. He has a 25.00 pack-year smoking history. He has never used smokeless tobacco. He reports that he does not drink alcohol or use drugs.  ROS: UROLOGY Frequent Urination?: No Hard to postpone urination?: No Burning/pain with urination?: No Get up at night to urinate?: No Leakage of urine?: No Urine stream starts and stops?: No Trouble starting stream?: No Do you have to strain to urinate?: No Blood in urine?: No Urinary tract infection?: Yes Sexually transmitted disease?: No Injury to kidneys or bladder?: No Painful intercourse?: No Weak stream?: No Erection problems?: No Penile pain?: No  Gastrointestinal Nausea?: No Vomiting?: No Indigestion/heartburn?: No Diarrhea?: No Constipation?: No  Constitutional Fever: No Night sweats?: No Weight loss?: Yes Fatigue?: No  Skin Skin rash/lesions?: No Itching?:  No  Eyes Blurred vision?: No Double vision?: No  Ears/Nose/Throat Sore throat?: No Sinus problems?: Yes  Hematologic/Lymphatic Swollen glands?: No Easy bruising?: No  Cardiovascular Leg swelling?: No Chest pain?: No  Respiratory Cough?: No Shortness of breath?: No  Endocrine Excessive thirst?: No  Musculoskeletal Back pain?: Yes Joint pain?: Yes  Neurological Headaches?: No Dizziness?: No  Psychologic Depression?: No Anxiety?: No  Physical Exam: BP 127/63   Pulse 64   Temp 97.7 F (36.5 C) (Oral)   Ht 5\' 7"  (1.702 m)   Wt 113 lb 12.8 oz (51.6 kg)   BMI 17.82 kg/m   Constitutional: Well nourished. Alert and oriented, No acute distress. HEENT: [place] AT, moist mucus membranes. Trachea midline, no masses. Cardiovascular: No clubbing, cyanosis, or edema. Respiratory: Normal respiratory effort, no increased work of breathing. GI: Abdomen is soft, non tender, non distended, no abdominal masses. Liver and spleen not palpable.  Left inguinal hernia that is easily reducible.  Stool sample for occult testing is not indicated.  Point tenderness in the right lower quadrant.  ? Of a bulge but it seemed to disappear as I continued to manipulate the area and his pain lessoned.  Patient examined in both supine and standing position.   GU: No CVA tenderness.  No bladder fullness or masses.  Patient with uncircumcised phallus. Foreskin easily retracted.  Urethral meatus is patent.  No penile discharge. No penile lesions or rashes. Scrotum without lesions, cysts, rashes and/or edema.  Testicles are located scrotally bilaterally. No masses are appreciated in the testicles. Left and right epididymis are normal. Rectal: Not performed. Skin: No rashes, bruises or suspicious lesions. Lymph: No cervical or inguinal adenopathy. Neurologic: Grossly intact, no focal deficits, moving all 4 extremities. Psychiatric: Normal mood and affect.  Laboratory Data: PSA History  0.36 ng/mL on  08/07/2015  Urinalysis Negative.  */See EPIC.    Pertinent imaging CLINICAL DATA:  Acute right inguinal pain.  EXAM: ULTRASOUND right LOWER EXTREMITY LIMITED  TECHNIQUE: Ultrasound examination of the  lower extremity soft tissues was performed in the area of clinical concern.  COMPARISON:  None.  FINDINGS: There is no definite evidence of mass, cyst, fluid collection or hernia seen in the area of palpable concern in right inguinal region.  IMPRESSION: No sonographic abnormality seen in area of palpable concern in right inguinal region.   Electronically Signed   By: Marijo Conception, M.D.   On: 02/16/2017 12:35  CLINICAL DATA:  Status post renal transplant with posttransplant lymphoproliferative disease and lymphoma, history of right-sided pelvic pain and groin pain  EXAM: CT ABDOMEN AND PELVIS WITHOUT CONTRAST  TECHNIQUE: Multidetector CT imaging of the abdomen and pelvis was performed following the standard protocol without IV contrast.  COMPARISON:  PET CT 01/14/2017, CT abdomen pelvis 06/25/2016  FINDINGS: Lower chest: Re- demonstrated metastatic pulmonary nodules within the left upper and lower lobes. Left lower lobe pulmonary nodule appears increased measuring 13 mm, compared to 1 cm previously. No pleural effusion. No focal consolidation. Mild cardiomegaly. Small pericardial effusion.  Hepatobiliary: Re- demonstrated multiple hypodense liver lesions, compatible with metastatic foci. These also appear increased. For example posterior right hepatic lobe lesion estimated diameter of 3.6 cm, compared with 3 cm previously. Left hepatic lobe lesion 4.9 cm compare with 4.1 cm previously. No biliary dilatation. Possible small calcified gallstone. Lobular hypodensity adjacent to the gallbladder fossa also appears slightly enlarged.  Pancreas: Unremarkable. No pancreatic ductal dilatation or surrounding inflammatory changes.  Spleen: Hypodense  masses in the spleen consistent with metastatic foci. These also appear increased in size. For example superior lesion measures 2.4 cm compared to 2.1 cm previously.  Adrenals/Urinary Tract: The adrenal glands are within normal limits. Atrophic native kidneys without hydronephrosis. Intrarenal vascular calcification. Transplanted right lower quadrant kidney, without hydronephrosis. Bladder unremarkable.  Stomach/Bowel: Slightly enlarged stomach containing contrast. No dilated small bowel. Colon diverticular disease. No acute wall thickening. Normal appendix.  Vascular/Lymphatic: Extensive atherosclerotic calcifications of the aorta. Non aneurysmal. Bulky masses/adenopathy re- demonstrated within the retroperitoneum and mesentery. Retrocaval soft tissue mass measures 6 x 3.1 cm, compared with 5.8 x 3.3 cm previously. Left periaortic mass/adenopathy measures 2.4 cm compared with 2.3 cm. Multiple solid masses within the central and right abdominal mesentery also appears slightly increased in size. Index lesion in the pelvis measures 5.7 cm compared with 4.5 cm previously.  Reproductive: Enlarged prostate with mass effect on the bladder  Other: Negative for free air or free fluid.  Musculoskeletal: Postsurgical changes of the lumbosacral spine with extensive degenerative changes. Scattered lucent lesions within L3, the sacrum, and pelvis are unchanged and were not hypermetabolic on recent PET-CT.  IMPRESSION: 1. Slight increased size of left lower lobe pulmonary metastatic nodule. 2. Re- demonstrated hypodense liver masses and splenic masses, consistent with metastatic disease, suspect increased in size compared to prior PET-CT. Bulky mesenteric and retroperitoneal masses consistent with metastatic disease, also increased in size compared to recent PET-CT. 3. Atrophic native kidneys. Transplant kidney in the right lower quadrant. Negative for  hydronephrosis   Electronically Signed   By: Donavan Foil M.D.   On: 02/17/2017 03:00   Assessment & Plan:    1. Right lower quadrant tenderness  - question of right inguinal hernia  - will refer to [facility] for further evaluation  - Reviewed red flags signs and symptoms (high fevers, chills, unable to reduce the hernia, nausea, vomiting and/or intense pain)  2. Left inguinal hernia  - Reducible at this time  - Referred to general surgery for further evaluation  and management  - reviewed red flags signs and symptoms (high fevers, chills, unable to reduce the hernia, nausea, vomiting and/or intense pain)  3. 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 Mike Gip  4. History of renal transplant  - no hydronephrosis seen on recent CT  - recent Cr 1.55  Return for refer to [facility].  These notes generated with voice recognition software. I apologize for typographical errors.  Zara Council, [facility] [address], [place], [place] [address] [phone]

## 2017-02-18 NOTE — Unmapped (Signed)
[facility] Refill Coordination Note  Specialty Medication(s): PROGRAF 1MG   Additional Medications shipped: NO    Taylor Ramos, DOB: [birth year]  Phone: [phone] (home) , Alternate phone contact: N/A  Phone or address changes today?: No  All above HIPAA information was verified with patient's family member.  Shipping Address: [address]  [place] [address]   Insurance changes? No    Completed refill call assessment today to schedule patient's medication shipment from the [facility] [phone]).      Confirmed the medication and dosage are correct and have not changed: Yes, regimen is correct and unchanged.    Confirmed patient started or stopped the following medications in the past month:  No, there are no changes reported at this time.    Are you tolerating your medication?:  Taylor Ramos reports tolerating the medication.    ADHERENCE    (Below is required for Medicare Part B or Transplant patients only - per drug):   How many tablets were dispensed last month: 180  Patient currently has 84 remaining.    Did you miss any doses in the past 4 weeks? No missed doses reported.    FINANCIAL/SHIPPING    Delivery Scheduled: Yes, Expected medication delivery date: 02/27/17     Taylor Ramos did not have any additional questions at this time.    Delivery address validated in [facility] scheduling system: Yes, address listed in [facility] is correct.    We will follow up with patient monthly for standard refill processing and delivery.      Thank you,  Taylor Ramos   [facility] Specialty Pharmacist

## 2017-02-19 ENCOUNTER — Inpatient Hospital Stay: Payer: Medicare HMO

## 2017-02-19 ENCOUNTER — Encounter: Payer: Self-pay | Admitting: General Surgery

## 2017-02-19 ENCOUNTER — Other Ambulatory Visit: Payer: Self-pay | Admitting: Urology

## 2017-02-19 ENCOUNTER — Ambulatory Visit (INDEPENDENT_AMBULATORY_CARE_PROVIDER_SITE_OTHER): Payer: Medicare HMO | Admitting: General Surgery

## 2017-02-19 ENCOUNTER — Inpatient Hospital Stay (HOSPITAL_BASED_OUTPATIENT_CLINIC_OR_DEPARTMENT_OTHER): Payer: Medicare HMO | Admitting: Hematology and Oncology

## 2017-02-19 ENCOUNTER — Telehealth: Payer: Self-pay | Admitting: Urgent Care

## 2017-02-19 VITALS — BP 110/54 | HR 58 | Resp 12 | Ht 66.0 in | Wt 118.0 lb

## 2017-02-19 VITALS — BP 134/61 | HR 56 | Temp 97.2°F | Resp 18 | Wt 117.4 lb

## 2017-02-19 DIAGNOSIS — D47Z1 Post-transplant lymphoproliferative disorder (PTLD): Secondary | ICD-10-CM

## 2017-02-19 DIAGNOSIS — M109 Gout, unspecified: Secondary | ICD-10-CM

## 2017-02-19 DIAGNOSIS — Z8619 Personal history of other infectious and parasitic diseases: Secondary | ICD-10-CM

## 2017-02-19 DIAGNOSIS — C787 Secondary malignant neoplasm of liver and intrahepatic bile duct: Secondary | ICD-10-CM

## 2017-02-19 DIAGNOSIS — Z7689 Persons encountering health services in other specified circumstances: Secondary | ICD-10-CM | POA: Diagnosis not present

## 2017-02-19 DIAGNOSIS — Z7952 Long term (current) use of systemic steroids: Secondary | ICD-10-CM

## 2017-02-19 DIAGNOSIS — R634 Abnormal weight loss: Secondary | ICD-10-CM

## 2017-02-19 DIAGNOSIS — C7800 Secondary malignant neoplasm of unspecified lung: Secondary | ICD-10-CM

## 2017-02-19 DIAGNOSIS — K4090 Unilateral inguinal hernia, without obstruction or gangrene, not specified as recurrent: Secondary | ICD-10-CM

## 2017-02-19 DIAGNOSIS — C8333 Diffuse large B-cell lymphoma, intra-abdominal lymph nodes: Secondary | ICD-10-CM

## 2017-02-19 DIAGNOSIS — Z7189 Other specified counseling: Secondary | ICD-10-CM

## 2017-02-19 DIAGNOSIS — R51 Headache: Secondary | ICD-10-CM

## 2017-02-19 DIAGNOSIS — M1990 Unspecified osteoarthritis, unspecified site: Secondary | ICD-10-CM

## 2017-02-19 DIAGNOSIS — R630 Anorexia: Secondary | ICD-10-CM

## 2017-02-19 DIAGNOSIS — N491 Inflammatory disorders of spermatic cord, tunica vaginalis and vas deferens: Secondary | ICD-10-CM

## 2017-02-19 DIAGNOSIS — C7951 Secondary malignant neoplasm of bone: Secondary | ICD-10-CM

## 2017-02-19 DIAGNOSIS — C7889 Secondary malignant neoplasm of other digestive organs: Secondary | ICD-10-CM

## 2017-02-19 DIAGNOSIS — Z5111 Encounter for antineoplastic chemotherapy: Secondary | ICD-10-CM | POA: Diagnosis not present

## 2017-02-19 DIAGNOSIS — K219 Gastro-esophageal reflux disease without esophagitis: Secondary | ICD-10-CM

## 2017-02-19 DIAGNOSIS — C8338 Diffuse large B-cell lymphoma, lymph nodes of multiple sites: Secondary | ICD-10-CM

## 2017-02-19 DIAGNOSIS — E042 Nontoxic multinodular goiter: Secondary | ICD-10-CM

## 2017-02-19 DIAGNOSIS — Z940 Kidney transplant status: Secondary | ICD-10-CM

## 2017-02-19 DIAGNOSIS — E785 Hyperlipidemia, unspecified: Secondary | ICD-10-CM

## 2017-02-19 DIAGNOSIS — R1012 Left upper quadrant pain: Secondary | ICD-10-CM | POA: Diagnosis not present

## 2017-02-19 DIAGNOSIS — I129 Hypertensive chronic kidney disease with stage 1 through stage 4 chronic kidney disease, or unspecified chronic kidney disease: Secondary | ICD-10-CM | POA: Diagnosis not present

## 2017-02-19 DIAGNOSIS — T8699 Other complications of unspecified transplanted organ and tissue: Secondary | ICD-10-CM

## 2017-02-19 DIAGNOSIS — Z87891 Personal history of nicotine dependence: Secondary | ICD-10-CM

## 2017-02-19 DIAGNOSIS — N186 End stage renal disease: Secondary | ICD-10-CM

## 2017-02-19 DIAGNOSIS — R1032 Left lower quadrant pain: Secondary | ICD-10-CM

## 2017-02-19 DIAGNOSIS — Z79899 Other long term (current) drug therapy: Secondary | ICD-10-CM

## 2017-02-19 DIAGNOSIS — E210 Primary hyperparathyroidism: Secondary | ICD-10-CM

## 2017-02-19 DIAGNOSIS — I272 Pulmonary hypertension, unspecified: Secondary | ICD-10-CM

## 2017-02-19 DIAGNOSIS — Z800 Family history of malignant neoplasm of digestive organs: Secondary | ICD-10-CM

## 2017-02-19 DIAGNOSIS — R1031 Right lower quadrant pain: Secondary | ICD-10-CM

## 2017-02-19 DIAGNOSIS — N400 Enlarged prostate without lower urinary tract symptoms: Secondary | ICD-10-CM | POA: Diagnosis not present

## 2017-02-19 DIAGNOSIS — I4891 Unspecified atrial fibrillation: Secondary | ICD-10-CM

## 2017-02-19 DIAGNOSIS — Z8719 Personal history of other diseases of the digestive system: Secondary | ICD-10-CM

## 2017-02-19 LAB — CBC WITH DIFFERENTIAL/PLATELET
Basophils Absolute: 0 10*3/uL (ref 0–0.1)
Basophils Relative: 0 %
Eosinophils Absolute: 0 10*3/uL (ref 0–0.7)
Eosinophils Relative: 0 %
HCT: 26.6 % — ABNORMAL LOW (ref 40.0–52.0)
Hemoglobin: 9.1 g/dL — ABNORMAL LOW (ref 13.0–18.0)
Lymphocytes Relative: 3 %
Lymphs Abs: 0.6 10*3/uL — ABNORMAL LOW (ref 1.0–3.6)
MCH: 27.1 pg (ref 26.0–34.0)
MCHC: 34.2 g/dL (ref 32.0–36.0)
MCV: 79 fL — ABNORMAL LOW (ref 80.0–100.0)
Monocytes Absolute: 1.4 10*3/uL — ABNORMAL HIGH (ref 0.2–1.0)
Monocytes Relative: 8 %
Neutro Abs: 15.2 10*3/uL — ABNORMAL HIGH (ref 1.4–6.5)
Neutrophils Relative %: 89 %
Platelets: 134 10*3/uL — ABNORMAL LOW (ref 150–440)
RBC: 3.36 MIL/uL — ABNORMAL LOW (ref 4.40–5.90)
RDW: 20.4 % — ABNORMAL HIGH (ref 11.5–14.5)
WBC: 17.2 10*3/uL — ABNORMAL HIGH (ref 3.8–10.6)

## 2017-02-19 LAB — COMPREHENSIVE METABOLIC PANEL
ALT: 12 U/L — ABNORMAL LOW (ref 17–63)
AST: 36 U/L (ref 15–41)
Albumin: 3.7 g/dL (ref 3.5–5.0)
Alkaline Phosphatase: 118 U/L (ref 38–126)
Anion gap: 8 (ref 5–15)
BUN: 22 mg/dL — ABNORMAL HIGH (ref 6–20)
CO2: 27 mmol/L (ref 22–32)
Calcium: 9.9 mg/dL (ref 8.9–10.3)
Chloride: 97 mmol/L — ABNORMAL LOW (ref 101–111)
Creatinine, Ser: 1.68 mg/dL — ABNORMAL HIGH (ref 0.61–1.24)
GFR calc Af Amer: 44 mL/min — ABNORMAL LOW (ref >60)
GFR calc non Af Amer: 38 mL/min — ABNORMAL LOW (ref >60)
Glucose, Bld: 86 mg/dL (ref 65–99)
Potassium: 4.3 mmol/L (ref 3.5–5.1)
Sodium: 132 mmol/L — ABNORMAL LOW (ref 135–145)
Total Bilirubin: 0.9 mg/dL (ref 0.3–1.2)
Total Protein: 6.1 g/dL — ABNORMAL LOW (ref 6.5–8.1)

## 2017-02-19 LAB — LACTATE DEHYDROGENASE: LDH: 525 U/L — ABNORMAL HIGH (ref 98–192)

## 2017-02-19 LAB — URIC ACID: Uric Acid, Serum: 8.1 mg/dL — ABNORMAL HIGH (ref 4.4–7.6)

## 2017-02-19 MED ORDER — SODIUM CHLORIDE 0.9 % IV SOLN
Freq: Once | INTRAVENOUS | Status: AC
  Administered 2017-02-19: 11:00:00 via INTRAVENOUS
  Filled 2017-02-19: qty 1000

## 2017-02-19 MED ORDER — CEPHALEXIN 500 MG PO CAPS: 500.0000 mg | ORAL_CAPSULE | Freq: Two times a day (BID) | ORAL | 0 refills | Status: DC

## 2017-02-19 MED ORDER — SODIUM CHLORIDE 0.9% FLUSH
10.0000 mL | INTRAVENOUS | Status: DC | PRN
  Administered 2017-02-19: 10 mL via INTRAVENOUS
  Filled 2017-02-19: qty 10

## 2017-02-19 MED ORDER — HEPARIN SOD (PORK) LOCK FLUSH 100 UNIT/ML IV SOLN
500.0000 [IU] | Freq: Once | INTRAVENOUS | Status: AC
  Administered 2017-02-19: 500 [IU] via INTRAVENOUS
  Filled 2017-02-19: qty 5

## 2017-02-19 NOTE — Telephone Encounter (Signed)
Patient was seen by Dr. Jamal Collin earlier today. He was diagnosed with funiculitis. Dr. Mike Gip and I briefly consulted with Dr. Erlene Quan today at tumor board regarding further evaluation and treatment. Per Dr. Erlene Quan, this condition is generally not infectious in nature. Dr. Erlene Quan recommended treatment with NSAIDs. We advised Dr. Erlene Quan that this patient was in an immense amount of pain; his RIGHT groin is exquisitely tender to palpation. She was advised that both ultrasound and CT of the abdomen and pelvis were negative. Dr. Erlene Quan willing to see patient in consult tomorrow, with plans for a block of the affected area with Lidocaine. This NP called Dr. Cherrie Gauze office to obtain appointment; advised that patient will be added for 11:15. Patient was called and made aware of plan of care as it stands at this time. Given patient's previous history of a transplant, Dr. Mike Gip wanting to start low dose Ibuprofen 200mg  BID PRN pain. Patient to return to the medical oncology clinic after disposition from Dr. Erlene Quan. Plans are to resume chemotherapy treatments on Tuesday (02/24/2017). Patient aware.

## 2017-02-19 NOTE — Patient Instructions (Signed)
The patient is aware to call back for any questions or concerns.  

## 2017-02-19 NOTE — Progress Notes (Signed)
Please let Mr. Mcquigg know that I have sent in the antibiotic Keflex for his folliculitis to [facility].  I would like to see him back in one week.    Spoke with pt friend, Jerilynn Birkenhead, who stated abx has been picked up and started at this time.

## 2017-02-19 NOTE — Progress Notes (Signed)
Patient saw Dr. Ernestine Conrad yesterday.  Patient states he has bilateral hernias in his groin that are causing him pain.  He can manipulate the one on the left side and put it back in but the one on the left is his problem. Patient states he did not sleep well last night.  Appetite is improving.  He has had a 4# weight gain since last visit.

## 2017-02-19 NOTE — Progress Notes (Signed)
Spoke with Aaron Edelman, NP, via telephone. Per NP order: patient will not receive scheduled treatment today. Patient is to get 1043ml 0.9% Sodium Chloride infusion at 530ml/hr once today. Order released from supportive therapy plan. See MAR.

## 2017-02-19 NOTE — Progress Notes (Signed)
Patient ID: Matthew Brown, male   DOB: [birth year], 76 y.o.   MRN: [id number]  Chief Complaint  Patient presents with  . Hernia    HPI [name] is a 76 y.o. male.  Patient here today for an evaluation of a hernia referred by Matthew Brown.  He states that he has had right groin pain for 2 weeks.   It does seem to be causing some groin pain but no knot noticied.  No nausea, vomiting, constipation or diarrhea noted. He is followed by the [facility] and states he has to go back this afternoon for some IVF. He is here with his significant other, [name].  HPI  Past Medical History:  Diagnosis Date  . Benign prostatic hypertrophy   . Chronic headache 10/19/2015  . ED (erectile dysfunction)   . End stage renal disease ([place])   . Essential hypertension   . GERD (gastroesophageal reflux disease)   . GIB (gastrointestinal bleeding)    a. 02/6294 s/p R colic artery embolization;  b. 02/2015 EGD: duod ulcerative mass->Bx notable for coagulative necrosis - ? ischemia vs thrombosis-->coumadin d/c'd.  . Gout   . Hearing loss   . Hemorrhoids   . Hyperlipidemia   . Lymphoma ([place])   . Lymphoma ([place]) 2017  . Multiple thyroid nodules 06/06/2016   Noted on carotid US; dedicated US to be ordered by staff  . Osteoarthrosis, unspecified whether generalized or localized, lower leg   . Persistent atrial fibrillation ([place])    a. CHA2DS2VASc = 3-->coumadin d/c'd 02/2015 2/2 recurrent GIB.  [name] Prostatitis   . Pulmonary hypertension ([place])    a. 10/2014 Echo: EF 60-65%, mild to mod MR, mildly dil LA, nl RV, PASP 42mmHg.  [name] Renal transplant recipient   . Ulcers of both great toes [facility])     Past Surgical History:  Procedure Laterality Date  . AV FISTULA PLACEMENT  1998  . BACK SURGERY    . ESOPHAGOGASTRODUODENOSCOPY  03/13/15   severe esophagitis, ulcerated mass  . ESOPHAGOGASTRODUODENOSCOPY (EGD) WITH PROPOFOL N/A 07/09/2016   Procedure: ESOPHAGOGASTRODUODENOSCOPY (EGD) WITH PROPOFOL;  Surgeon: Matthew Bellows, MD;  Location: [facility] ENDOSCOPY;  Service: Endoscopy;  Laterality: N/A;  . HERNIA REPAIR  1974  . KIDNEY TRANSPLANT  2006  . PERIPHERAL VASCULAR CATHETERIZATION N/A 05/07/2016   Procedure: Matthew Brown Cath Insertion;  Surgeon: Matthew Huxley, MD;  Location: [place] CV LAB;  Service: Cardiovascular;  Laterality: N/A;  . PROSTATE ABLATION    . STOMACH SURGERY     blood vessel burst  . THROAT SURGERY    . TOTAL KNEE ARTHROPLASTY      Family History  Problem Relation Age of Onset  . Cancer Mother        throat  . Diabetes Brother   . Heart disease Brother   . Stroke Brother   . Hypertension Brother   . Diabetes Sister   . Heart disease Sister   . Hypertension Sister   . Diabetes Sister   . Diabetes Brother   . COPD Neg Hx   . Kidney disease Neg Hx   . Prostate cancer Neg Hx   . Kidney cancer Neg Hx   . Bladder Cancer Neg Hx     Social History Social History  Substance Use Topics  . Smoking status: Former Smoker    Packs/day: 1.00    Years: 25.00    Types: Cigarettes    Quit date: 06/23/1978  . Smokeless tobacco: Never Used  .  Alcohol use No    No Known Allergies  Current Outpatient Prescriptions  Medication Sig Dispense Refill  . acetaminophen (TYLENOL) 325 MG tablet Take 2 tablets (650 mg total) by mouth every 6 (six) hours as needed for mild pain (or Fever >/= 101).    [name] albuterol (PROAIR HFA) 108 (90 BASE) MCG/ACT inhaler Inhale 1-2 puffs into the lungs every 4 (four) hours as needed.     [name] allopurinol (ZYLOPRIM) 100 MG tablet Take 100 mg by mouth daily.      [name] COLCRYS 0.6 MG tablet Take 1 tablet by mouth 2 (two) times daily as needed.    . diphenhydrAMINE (BENADRYL) 25 mg capsule Take 1 capsule (25 mg total) by mouth at bedtime as needed for sleep. 30 capsule 0  . feeding supplement, ENSURE ENLIVE, (ENSURE ENLIVE) LIQD Take 237 mLs by mouth 3 (three) times daily between meals. 90 Bottle 0  . finasteride (PROSCAR) 5 MG tablet Take 1 tablet (5 mg total) by mouth  daily. 90 tablet 3  . furosemide (LASIX) 20 MG tablet Take 20 mg by mouth every other day. Take 1-2 tablets daily.    . hydroxypropyl methylcellulose (ISOPTO TEARS) 2.5 % ophthalmic solution Place 1 drop into both eyes as needed.     . metoprolol tartrate (LOPRESSOR) 25 MG tablet Take 12.5 mg by mouth 2 (two) times daily.     . Multiple Vitamin (MULTIVITAMIN) tablet Take 1 tablet by mouth daily.      . ondansetron (ZOFRAN) 4 MG tablet Take 1 tablet (4 mg total) by mouth every 6 (six) hours as needed for nausea. 20 tablet 0  . oxybutynin (DITROPAN-XL) 5 MG 24 hr tablet Take 1 tablet (5 mg total) by mouth daily. 90 tablet 3  . oxyCODONE-acetaminophen (ROXICET) 5-325 MG tablet Take 1 tablet by mouth every 6 (six) hours as needed for severe pain. 20 tablet 0  . pantoprazole (PROTONIX) 40 MG tablet TAKE 1 TABLET BY MOUTH TWICE A DAY 60 tablet 1  . pantoprazole (PROTONIX) 40 MG tablet TAKE 1 TABLET BY MOUTH TWICE A DAY 60 tablet 1  . predniSONE (DELTASONE) 20 MG tablet as directed.     . predniSONE (DELTASONE) 5 MG tablet Take 5 mg by mouth daily.     . simethicone (MYLICON) 80 MG chewable tablet Chew 1 tablet (80 mg total) by mouth every 6 (six) hours as needed for flatulence. 120 tablet 0  . sucralfate (CARAFATE) 1 g tablet Take 1 tablet (1 g total) by mouth 4 (four) times daily. Resume taking after one week- once finished taking oral levaquine. ( to avoid interaction.) 40 tablet 3  . tacrolimus (PROGRAF) 1 MG capsule Take 3 mg by mouth 2 (two) times daily. Reported on 08/16/2015    . tamsulosin (FLOMAX) 0.4 MG CAPS capsule Take 1 capsule (0.4 mg total) by mouth daily. 90 capsule 3  . traZODone (DESYREL) 50 MG tablet 1-2 TABS AS NEEDED FOR SLEEP  3  . cephALEXin (KEFLEX) 500 MG capsule Take 1 capsule (500 mg total) by mouth 2 (two) times daily. 20 capsule 0   No current facility-administered medications for this visit.    Facility-Administered Medications Ordered in Other Visits  Medication Dose  Route Frequency Provider Last Rate Last Dose  . sodium chloride flush (NS) 0.9 % injection 10 mL  10 mL Intracatheter PRN Lequita Asal, MD        Review of Systems Review of Systems  Constitutional: Negative.   Respiratory: Negative.  Cardiovascular: Negative.   Gastrointestinal: Positive for abdominal pain. Negative for constipation, diarrhea and nausea.    Blood pressure (!) 110/54, pulse (!) 58, resp. rate 12, height 5\' 6"  (1.676 m), weight 118 lb (53.5 kg).  Physical Exam Physical Exam  Constitutional: He is oriented to person, place, and time. He appears well-developed and well-nourished.  HENT:  Mouth/Throat: Oropharynx is clear and moist.  Eyes: Conjunctivae are normal. No scleral icterus.  Neck: Neck supple.  Abdominal: Soft. Normal appearance and bowel sounds are normal. There is tenderness. A hernia is present. Hernia confirmed positive in the left inguinal area. Hernia confirmed negative in the right inguinal area.  Non tender Small reducible left inguinal hernia. Tender over the right spermatic cord below level of external ring, no hernia noted on the right   Lymphadenopathy:    He has no cervical adenopathy.  Neurological: He is alert and oriented to person, place, and time.  Skin: Skin is warm and dry.  Psychiatric: His behavior is normal.    Data Reviewed Progress notes  Assessment    Right Funiculitis, no hernia noted. Pt advised    Plan    Findings discussed with Matthew Council, patient to return to her office for continue care. Follow up as needed.       HPI, Physical Exam, Assessment and Plan have been scribed under the direction and in the presence of Mckinley Jewel, MD Karie Fetch, RN I have completed the exam and reviewed the above documentation for accuracy and completeness.  I agree with the above.  Haematologist has been used and any errors in dictation or transcription are unintentional.  Seeplaputhur G. Jamal Collin, M.D.,  F.A.C.S.  Junie Panning G 02/20/2017, 8:44 AM

## 2017-02-20 ENCOUNTER — Other Ambulatory Visit: Payer: Self-pay | Admitting: *Deleted

## 2017-02-20 ENCOUNTER — Ambulatory Visit (INDEPENDENT_AMBULATORY_CARE_PROVIDER_SITE_OTHER): Payer: Medicare HMO | Admitting: Urology

## 2017-02-20 ENCOUNTER — Encounter: Payer: Self-pay | Admitting: Urology

## 2017-02-20 VITALS — BP 119/56 | HR 64 | Ht 66.0 in | Wt 114.0 lb

## 2017-02-20 DIAGNOSIS — N401 Enlarged prostate with lower urinary tract symptoms: Secondary | ICD-10-CM | POA: Diagnosis not present

## 2017-02-20 DIAGNOSIS — N138 Other obstructive and reflux uropathy: Secondary | ICD-10-CM

## 2017-02-20 DIAGNOSIS — C8333 Diffuse large B-cell lymphoma, intra-abdominal lymph nodes: Secondary | ICD-10-CM

## 2017-02-20 DIAGNOSIS — N5089 Other specified disorders of the male genital organs: Secondary | ICD-10-CM

## 2017-02-22 ENCOUNTER — Encounter: Payer: Self-pay | Admitting: Hematology and Oncology

## 2017-02-23 NOTE — Progress Notes (Signed)
   02/23/17  CC:  Chief Complaint  Patient presents with  . Follow-up    HPI: 76 year old status post renal transplant 2007 with stage IV diffuse large B-cell lymphoma. Most recently, he was seen and evaluated in our clinic found to have right groin pain which is very severe in nature. He is felt to have a possible hernia and sent general surgery for further evaluation. Dr. Jamal Collin subsequently diagnosed patient with inflammation of the spermatic cord.  After discussing the patient with Dr. Mike Gip, difficulty to perform right-sided cord block for both therapeutic but primarily diagnostic purposes to further evaluate his pain. Patient is agreeable with this plan.  Blood pressure (!) 119/56, pulse 64, height 5\' 6"  (1.676 m), weight 114 lb (51.7 kg). NED. A&Ox3.   No respiratory distress   Abd soft, NT, ND Normal phallus with bilateral descended testicles without masses.  Tenderness in the right spermatic cord without fullness, hernia, or mass.  Procedure: Patient's identity and site were confirmed. He was prepped with Betadine scrub and prepped in the sterile fashion.  A cord block was then performed along the length of the right inguinal cord using 1% lidocaine, approximately 20 cc. Initially, the cord was quite tender to palpation but by the end of the procedure, he had absolutely NO pain.  Assessment/ Plan:  1. Spermatic cord pain S/p successful cord on right with complete resolution of his pain today Highly recommend course of NSAIDs as prescribed. Dr. Mike Gip to reduce inflammation and the cord Supportive care with scrotal support-currently wears no underwear and has very loose scrotum which likely exacerbates issue If these fail, would consider addition of gabapentin for neuropathic pain Consider physical therapy consult if pain fails to resolve with above measures  2. BPH with obstruction/lower urinary tract symptoms Continue Flomax and finasteride  Hollice Espy,  MD   I spent 15 min with this patient of which greater than 50% was spent in counseling and coordination of care with the patient.  Case discussed with Dr. Mike Gip.

## 2017-02-24 ENCOUNTER — Inpatient Hospital Stay: Payer: Medicare HMO

## 2017-02-24 ENCOUNTER — Inpatient Hospital Stay (HOSPITAL_BASED_OUTPATIENT_CLINIC_OR_DEPARTMENT_OTHER): Payer: Medicare HMO | Admitting: Hematology and Oncology

## 2017-02-24 ENCOUNTER — Other Ambulatory Visit: Payer: Self-pay | Admitting: *Deleted

## 2017-02-24 ENCOUNTER — Encounter: Payer: Self-pay | Admitting: Hematology and Oncology

## 2017-02-24 ENCOUNTER — Other Ambulatory Visit: Payer: Self-pay | Admitting: Hematology and Oncology

## 2017-02-24 ENCOUNTER — Inpatient Hospital Stay: Payer: Medicare HMO | Attending: Hematology and Oncology

## 2017-02-24 VITALS — BP 117/56 | HR 60 | Temp 96.6°F | Wt 120.3 lb

## 2017-02-24 DIAGNOSIS — Z79899 Other long term (current) drug therapy: Secondary | ICD-10-CM

## 2017-02-24 DIAGNOSIS — R944 Abnormal results of kidney function studies: Secondary | ICD-10-CM | POA: Insufficient documentation

## 2017-02-24 DIAGNOSIS — I4891 Unspecified atrial fibrillation: Secondary | ICD-10-CM | POA: Insufficient documentation

## 2017-02-24 DIAGNOSIS — Z5112 Encounter for antineoplastic immunotherapy: Secondary | ICD-10-CM

## 2017-02-24 DIAGNOSIS — R634 Abnormal weight loss: Secondary | ICD-10-CM | POA: Insufficient documentation

## 2017-02-24 DIAGNOSIS — I272 Pulmonary hypertension, unspecified: Secondary | ICD-10-CM | POA: Diagnosis not present

## 2017-02-24 DIAGNOSIS — N186 End stage renal disease: Secondary | ICD-10-CM | POA: Insufficient documentation

## 2017-02-24 DIAGNOSIS — N5089 Other specified disorders of the male genital organs: Secondary | ICD-10-CM | POA: Diagnosis not present

## 2017-02-24 DIAGNOSIS — C8330 Diffuse large B-cell lymphoma, unspecified site: Secondary | ICD-10-CM

## 2017-02-24 DIAGNOSIS — I129 Hypertensive chronic kidney disease with stage 1 through stage 4 chronic kidney disease, or unspecified chronic kidney disease: Secondary | ICD-10-CM

## 2017-02-24 DIAGNOSIS — N491 Inflammatory disorders of spermatic cord, tunica vaginalis and vas deferens: Secondary | ICD-10-CM

## 2017-02-24 DIAGNOSIS — E210 Primary hyperparathyroidism: Secondary | ICD-10-CM | POA: Insufficient documentation

## 2017-02-24 DIAGNOSIS — C7889 Secondary malignant neoplasm of other digestive organs: Secondary | ICD-10-CM

## 2017-02-24 DIAGNOSIS — Z800 Family history of malignant neoplasm of digestive organs: Secondary | ICD-10-CM | POA: Insufficient documentation

## 2017-02-24 DIAGNOSIS — Z940 Kidney transplant status: Secondary | ICD-10-CM | POA: Insufficient documentation

## 2017-02-24 DIAGNOSIS — R1031 Right lower quadrant pain: Secondary | ICD-10-CM

## 2017-02-24 DIAGNOSIS — E785 Hyperlipidemia, unspecified: Secondary | ICD-10-CM

## 2017-02-24 DIAGNOSIS — Z7189 Other specified counseling: Secondary | ICD-10-CM

## 2017-02-24 DIAGNOSIS — Z87891 Personal history of nicotine dependence: Secondary | ICD-10-CM | POA: Insufficient documentation

## 2017-02-24 DIAGNOSIS — D649 Anemia, unspecified: Secondary | ICD-10-CM | POA: Insufficient documentation

## 2017-02-24 DIAGNOSIS — C8339 Diffuse large B-cell lymphoma, extranodal and solid organ sites: Secondary | ICD-10-CM

## 2017-02-24 DIAGNOSIS — C8333 Diffuse large B-cell lymphoma, intra-abdominal lymph nodes: Secondary | ICD-10-CM

## 2017-02-24 DIAGNOSIS — M109 Gout, unspecified: Secondary | ICD-10-CM | POA: Diagnosis not present

## 2017-02-24 DIAGNOSIS — K219 Gastro-esophageal reflux disease without esophagitis: Secondary | ICD-10-CM

## 2017-02-24 DIAGNOSIS — M1990 Unspecified osteoarthritis, unspecified site: Secondary | ICD-10-CM

## 2017-02-24 DIAGNOSIS — Z5111 Encounter for antineoplastic chemotherapy: Secondary | ICD-10-CM | POA: Diagnosis present

## 2017-02-24 DIAGNOSIS — D47Z1 Post-transplant lymphoproliferative disorder (PTLD): Secondary | ICD-10-CM | POA: Insufficient documentation

## 2017-02-24 DIAGNOSIS — N400 Enlarged prostate without lower urinary tract symptoms: Secondary | ICD-10-CM

## 2017-02-24 DIAGNOSIS — C787 Secondary malignant neoplasm of liver and intrahepatic bile duct: Secondary | ICD-10-CM

## 2017-02-24 DIAGNOSIS — C7951 Secondary malignant neoplasm of bone: Secondary | ICD-10-CM

## 2017-02-24 LAB — CBC WITH DIFFERENTIAL/PLATELET
Basophils Absolute: 0 10*3/uL (ref 0–0.1)
Basophils Relative: 0 %
Eosinophils Absolute: 0 10*3/uL (ref 0–0.7)
Eosinophils Relative: 0 %
HCT: 26.6 % — ABNORMAL LOW (ref 40.0–52.0)
Hemoglobin: 9 g/dL — ABNORMAL LOW (ref 13.0–18.0)
Lymphocytes Relative: 4 %
Lymphs Abs: 0.6 10*3/uL — ABNORMAL LOW (ref 1.0–3.6)
MCH: 27.1 pg (ref 26.0–34.0)
MCHC: 33.8 g/dL (ref 32.0–36.0)
MCV: 80 fL (ref 80.0–100.0)
Monocytes Absolute: 1.7 10*3/uL — ABNORMAL HIGH (ref 0.2–1.0)
Monocytes Relative: 12 %
Neutro Abs: 12.5 10*3/uL — ABNORMAL HIGH (ref 1.4–6.5)
Neutrophils Relative %: 84 %
Platelets: 245 10*3/uL (ref 150–440)
RBC: 3.32 MIL/uL — ABNORMAL LOW (ref 4.40–5.90)
RDW: 20.6 % — ABNORMAL HIGH (ref 11.5–14.5)
WBC: 14.8 10*3/uL — ABNORMAL HIGH (ref 3.8–10.6)

## 2017-02-24 LAB — URIC ACID: Uric Acid, Serum: 7.4 mg/dL (ref 4.4–7.6)

## 2017-02-24 LAB — COMPREHENSIVE METABOLIC PANEL
ALT: 13 U/L — ABNORMAL LOW (ref 17–63)
AST: 38 U/L (ref 15–41)
Albumin: 3.4 g/dL — ABNORMAL LOW (ref 3.5–5.0)
Alkaline Phosphatase: 118 U/L (ref 38–126)
Anion gap: 5 (ref 5–15)
BUN: 25 mg/dL — ABNORMAL HIGH (ref 6–20)
CO2: 27 mmol/L (ref 22–32)
Calcium: 9.6 mg/dL (ref 8.9–10.3)
Chloride: 103 mmol/L (ref 101–111)
Creatinine, Ser: 1.39 mg/dL — ABNORMAL HIGH (ref 0.61–1.24)
GFR calc Af Amer: 55 mL/min — ABNORMAL LOW (ref >60)
GFR calc non Af Amer: 48 mL/min — ABNORMAL LOW (ref >60)
Glucose, Bld: 107 mg/dL — ABNORMAL HIGH (ref 65–99)
Potassium: 4.5 mmol/L (ref 3.5–5.1)
Sodium: 135 mmol/L (ref 135–145)
Total Bilirubin: 0.4 mg/dL (ref 0.3–1.2)
Total Protein: 6.4 g/dL — ABNORMAL LOW (ref 6.5–8.1)

## 2017-02-24 MED ORDER — SODIUM CHLORIDE 0.9 % IV SOLN
1000.0000 mg | Freq: Once | INTRAVENOUS | Status: AC
  Administered 2017-02-24: 1000 mg via INTRAVENOUS
  Filled 2017-02-24: qty 40

## 2017-02-24 MED ORDER — ACETAMINOPHEN 325 MG PO TABS
650.0000 mg | ORAL_TABLET | Freq: Once | ORAL | Status: AC
  Administered 2017-02-24: 650 mg via ORAL
  Filled 2017-02-24: qty 2

## 2017-02-24 MED ORDER — DIPHENHYDRAMINE HCL 50 MG/ML IJ SOLN
50.0000 mg | Freq: Once | INTRAMUSCULAR | Status: AC
  Administered 2017-02-24: 50 mg via INTRAVENOUS
  Filled 2017-02-24: qty 1

## 2017-02-24 MED ORDER — SODIUM CHLORIDE 0.9 % IV SOLN
20.0000 mg | Freq: Once | INTRAVENOUS | Status: AC
  Administered 2017-02-24: 20 mg via INTRAVENOUS
  Filled 2017-02-24: qty 2

## 2017-02-24 MED ORDER — DEXAMETHASONE SODIUM PHOSPHATE 100 MG/10ML IJ SOLN: 10.0000 mg | Freq: Once | INTRAMUSCULAR | Status: DC

## 2017-02-24 MED ORDER — SODIUM CHLORIDE 0.9 % IV SOLN
Freq: Once | INTRAVENOUS | Status: AC
  Administered 2017-02-24: 10:00:00 via INTRAVENOUS
  Filled 2017-02-24: qty 1000

## 2017-02-24 MED ORDER — DEXTROSE 5 % IV SOLN
Freq: Once | INTRAVENOUS | Status: AC
  Administered 2017-02-24: 14:00:00 via INTRAVENOUS
  Filled 2017-02-24: qty 1000

## 2017-02-24 MED ORDER — PALONOSETRON HCL INJECTION 0.25 MG/5ML
0.2500 mg | Freq: Once | INTRAVENOUS | Status: AC
  Administered 2017-02-24: 0.25 mg via INTRAVENOUS
  Filled 2017-02-24: qty 5

## 2017-02-24 MED ORDER — DEXTROSE 5 % IV SOLN
80.0000 mg/m2 | Freq: Once | INTRAVENOUS | Status: AC
  Administered 2017-02-24: 130 mg via INTRAVENOUS
  Filled 2017-02-24: qty 10

## 2017-02-24 MED ORDER — SODIUM CHLORIDE 0.9 % IV SOLN
1600.0000 mg | Freq: Once | INTRAVENOUS | Status: AC
  Administered 2017-02-24: 1600 mg via INTRAVENOUS
  Filled 2017-02-24: qty 26.3

## 2017-02-24 MED ORDER — HEPARIN SOD (PORK) LOCK FLUSH 100 UNIT/ML IV SOLN
500.0000 [IU] | Freq: Once | INTRAVENOUS | Status: AC
  Administered 2017-02-24: 500 [IU] via INTRAVENOUS

## 2017-02-24 MED ORDER — PEGFILGRASTIM 6 MG/0.6ML ~~LOC~~ PSKT
6.0000 mg | PREFILLED_SYRINGE | Freq: Once | SUBCUTANEOUS | Status: AC
  Administered 2017-02-24: 6 mg via SUBCUTANEOUS
  Filled 2017-02-24: qty 0.6

## 2017-02-24 NOTE — Progress Notes (Signed)
[facility] day:  02/24/2017   Chief Complaint: Matthew Brown is a 76 y.o. male with post-transplant lymphoproliferative disorder, stage IVBE diffuse large B cell lymphoma, who is seen for 1 week assessment prior to cycle #2 gemcitabine and oxaliplatin + obinutuzumab.  HPI:  The patient was last seen in the medical oncology symptom management clinic on 02/19/2017.  At that time, he had unexplained right inguinal pain causing decreased appetitite and weight loss.  Exam revealed exquisite pain without erythema or edema.  BUN/Cr were elevated secondary to decreased fluid intake.  He received IVF.  He had been referred to Dr Matthew Brown for possible right inguinal hernia by Matthew Council, PA.  Dr Matthew Brown saw him on 02/19/2017.  He noted tenderness over the right spermatic cord below level of external ring (funiculitis).  There was no hernia on the right.  He was referred to urology for diagnostic and therapeutic spermatic cord block.  He was seen by Dr. Hollice Brown on 02/20/2017.  He underwent successful cord block on right with complete resolution of his pain.  A course of NSAIDs was prescribed.  Supportive care with scrotal support was also recommended.  If these fail, the addition of gabapentin was recommended for neuropathic pain.  Physical therapy consult was also recommended if pain fails to resolve with these measures. Patient denied that the procedure was effective. He states, "That is the worst pain that I have ever had". Patient "walked the floor" all weekend. Patient reports that the prescribed Ibuprofen 251m BID is not helping "that much".   Symptomatically, he continues to have the pain in his RIGHT groin.  Pain has improved.  He is eating better.  Weight is up 6 lbs since last visit. He is otherwise doing well today. Patient denies numbness and tingling in his finger or toes.  He denies any fever or sweats.   Past Medical History:  Diagnosis Date   . Benign prostatic hypertrophy   . Chronic headache 10/19/2015  . ED (erectile dysfunction)   . End stage renal disease ([place]   . Essential hypertension   . GERD (gastroesophageal reflux disease)   . GIB (gastrointestinal bleeding)    a. 33/9030s/p R colic artery embolization;  b. 02/2015 EGD: duod ulcerative mass->Bx notable for coagulative necrosis - ? ischemia vs thrombosis-->coumadin d/c'd.  . Gout   . Hearing loss   . Hemorrhoids   . Hyperlipidemia   . Lymphoma ([place]   . Lymphoma ([place] 2017  . Multiple thyroid nodules 06/06/2016   Noted on carotid [place] dedicated [place] be ordered by staff  . Osteoarthrosis, unspecified whether generalized or localized, lower leg   . Persistent atrial fibrillation ([facility]    a. CHA2DS2VASc = 3-->coumadin d/c'd 02/2015 2/2 recurrent GIB.  .Marland KitchenProstatitis   . Pulmonary hypertension ([place]    a. 10/2014 Echo: EF 60-65%, mild to mod MR, mildly dil LA, nl RV, PASP 864mg.  . Marland Kitchenenal transplant recipient   . [facility] both great toes ([facility]    Past Surgical History:  Procedure Laterality Date  . AV FISTULA PLACEMENT  1998  . BACK SURGERY    . ESOPHAGOGASTRODUODENOSCOPY  03/13/15   severe esophagitis, ulcerated mass  . ESOPHAGOGASTRODUODENOSCOPY (EGD) WITH PROPOFOL N/A 07/09/2016   Procedure: ESOPHAGOGASTRODUODENOSCOPY (EGD) WITH PROPOFOL;  Surgeon: KiJonathon BellowsMD;  Location: [facility] ENDOSCOPY;  Service: Endoscopy;  Laterality: N/A;  . HERNIA REPAIR  1974  . KIDNEY TRANSPLANT  2006  .  PERIPHERAL VASCULAR CATHETERIZATION N/A 05/07/2016   Procedure: Matthew Brown;  Surgeon: Matthew Huxley, MD;  Location: [place] [facility];  Service: Cardiovascular;  Laterality: N/A;  . PROSTATE ABLATION    . STOMACH SURGERY     blood vessel burst  . THROAT SURGERY    . TOTAL KNEE ARTHROPLASTY      Family History  Problem Relation Age of Onset  . Cancer Mother        throat  . Diabetes Brother   . Heart disease Brother   . Stroke Brother   . Hypertension Brother    . Diabetes Sister   . Heart disease Sister   . Hypertension Sister   . Diabetes Sister   . Diabetes Brother   . COPD Neg Hx   . Kidney disease Neg Hx   . Prostate cancer Neg Hx   . Kidney cancer Neg Hx   . Bladder Cancer Neg Hx     Social History:  reports that he quit smoking about 38 years ago. His smoking use included Cigarettes. He has a 25.00 pack-year smoking history. He has never used smokeless tobacco. He reports that he does not drink alcohol or use drugs.  He stopped smoking in 1981.  He smoked 3 cigarettes/day.  He lives in [place].  The patient is accompanied by his wife, Matthew Brown,  today.  Allergies: No Known Allergies  Current Medications: Current Outpatient Prescriptions  Medication Sig Dispense Refill  . acetaminophen (TYLENOL) 325 MG tablet Take 2 tablets (650 mg total) by mouth every 6 (six) hours as needed for mild pain (or Fever >/= 101).    [name] albuterol (PROAIR HFA) 108 (90 BASE) MCG/ACT inhaler Inhale 1-2 puffs into the lungs every 4 (four) hours as needed.     [name] allopurinol (ZYLOPRIM) 100 MG tablet Take 100 mg by mouth daily.      . cephALEXin (KEFLEX) 500 MG capsule Take 1 capsule (500 mg total) by mouth 2 (two) times daily. 20 capsule 0  . COLCRYS 0.6 MG tablet Take 1 tablet by mouth 2 (two) times daily as needed.    . diphenhydrAMINE (BENADRYL) 25 mg capsule Take 1 capsule (25 mg total) by mouth at bedtime as needed for sleep. 30 capsule 0  . feeding supplement, ENSURE ENLIVE, (ENSURE ENLIVE) LIQD Take 237 mLs by mouth 3 (three) times daily between meals. 90 Bottle 0  . finasteride (PROSCAR) 5 MG tablet Take 1 tablet (5 mg total) by mouth daily. 90 tablet 3  . furosemide (LASIX) 20 MG tablet Take 20 mg by mouth every other day. Take 1-2 tablets daily.    . hydroxypropyl methylcellulose (ISOPTO TEARS) 2.5 % ophthalmic solution Place 1 drop into both eyes as needed.     . metoprolol tartrate (LOPRESSOR) 25 MG tablet Take 12.5 mg by mouth 2 (two) times daily.      . Multiple Vitamin (MULTIVITAMIN) tablet Take 1 tablet by mouth daily.      . ondansetron (ZOFRAN) 4 MG tablet Take 1 tablet (4 mg total) by mouth every 6 (six) hours as needed for nausea. 20 tablet 0  . oxybutynin (DITROPAN-XL) 5 MG 24 hr tablet Take 1 tablet (5 mg total) by mouth daily. 90 tablet 3  . pantoprazole (PROTONIX) 40 MG tablet TAKE 1 TABLET BY MOUTH TWICE A DAY 60 tablet 1  . pantoprazole (PROTONIX) 40 MG tablet TAKE 1 TABLET BY MOUTH TWICE A DAY 60 tablet 1  . predniSONE (DELTASONE) 20 MG tablet as  directed.     . predniSONE (DELTASONE) 5 MG tablet Take 5 mg by mouth daily.     . simethicone (MYLICON) 80 MG chewable tablet Chew 1 tablet (80 mg total) by mouth every 6 (six) hours as needed for flatulence. 120 tablet 0  . sucralfate (CARAFATE) 1 g tablet Take 1 tablet (1 g total) by mouth 4 (four) times daily. Resume taking after one week- once finished taking oral levaquine. ( to avoid interaction.) 40 tablet 3  . tacrolimus (PROGRAF) 1 MG capsule Take 3 mg by mouth 2 (two) times daily. Reported on 08/16/2015    . tamsulosin (FLOMAX) 0.4 MG CAPS capsule Take 1 capsule (0.4 mg total) by mouth daily. 90 capsule 3  . traZODone (DESYREL) 50 MG tablet 1-2 TABS AS NEEDED FOR SLEEP  3  . oxyCODONE-acetaminophen (ROXICET) 5-325 MG tablet Take 1 tablet by mouth every 6 (six) hours as needed for severe pain. (Patient not taking: Reported on 02/24/2017) 20 tablet 0   No current facility-administered medications for this visit.    Facility-Administered Medications Ordered in Other Visits  Medication Dose Route Frequency Provider Last Rate Last Dose  . sodium chloride flush (NS) 0.9 % injection 10 mL  10 mL Intracatheter PRN Lequita Asal, MD        Review of Systems:  GENERAL:  Feels "ok".  No fevers or sweats.  Weight up 6 pounds since last visit.  PERFORMANCE STATUS (ECOG):  1 HEENT:  No visual changes, sore throat, mouth sores or tenderness. Lungs: No shortness of breath or cough.   No hemoptysis. Cardiac:  No chest pain, palpitations, orthopnea, or PND. GI: Eating better.  No nausea, vomiting, diarrhea, constipation, melena or hematochezia. GU:  Enlarged prostate.  No urgency, frequency, dysuria, or hematuria.  Left inguinal hernia. Right inguinal pain, improved (no hernia).  Musculoskeletal: No back pain.  No joint pain.  No muscle tenderness. Extremities:  No pain or swelling. Skin:  Nail changes.  No rashes or skin changes. Neuro:  No headache, numbness or weakness, balance or coordination issues. Endocrine:  No diabetes, thyroid issues, hot flashes or night sweats. Psych:  No mood changes, depression or anxiety. Pain:  Right groin pain (8 out of 10), improved from last week. Review of systems:  All other systems reviewed and found to be negative.  Physical Exam: Blood pressure (!) 117/56, pulse 60, temperature (!) 96.6 F (35.9 C), temperature source Tympanic, weight 120 lb 5 oz (54.6 kg). GENERAL:  Thin elderly gentleman sitting comfortably in the exam room in no acute distress. He has a cane at his side. MENTAL STATUS:  Alert and oriented to person, place and time. HEAD:  Lu Duffel.  Temporal wasting.  Normocephalic, atraumatic, face symmetric, no Cushingoid features. EYES:  Glasses.  Brown eyes.  Pupils equal round and reactive to light and accomodation.  No conjunctivitis or scleral icterus. ENT:  Oropharynx clear without lesion.  Edentulous.  Tongue normal. Mucous membranes moist.  RESPIRATORY:  Clear to auscultation without rales, wheezes or rhonchi. CARDIOVASCULAR:  Regular rate and rhythm without murmur, rub or gallop. ABDOMEN:  Soft, non-tender, with active bowel sounds, and no hepatosplenomegaly.  No masses.  Palpable transplanted kidney.  GROIN:  Minimal tenderness on palpation right inguinal area near scrotum.  No erythema or induration.  No guarding or rebound tenderness SKIN:  No rashes, ulcers or lesions. EXTREMITIES:  No edema, no skin  discoloration or tenderness.  Right upper extremity with dilated vascular s/p AV fistula (chronic).  No palpable cords. NEUROLOGICAL: Unremarkable. PSYCH:  Appropriate.   Imaging studies:  04/28/2016 PET scan:  Bulky intensely hypermetabolic periaortic upper abdominal and mesenteric adenopathy concerning for high-grade lymphoma.  There was hypermetabolic liver metastasis.  There was hpermetabolic lesion involving the small bowel of the upper pelvis.  There were multiple sites of hypermetabolic skeletal metastasis. There was moderate volume right pneumothorax. 08/12/2016 PET scan:  Interval response to therapy. There has been significant decrease in extent of hypermetabolic tumor within the neck, chest, abdomen and pelvis as well as the axial and appendicular skeleton.  There was residual enlarged and hypermetabolic small bowel mesenteric and periaortic lymph nodes. There was a persistent hypermetabolic focus of increased uptake within the spleen.  There was resolution of previous multifocal hypermetabolic lesions within the liver. 10/10/2016 PET scan:  Slight worsening compared to the prior study. Several lesions were slightly more hypermetabolic and there appeared to be a newly enlarged and hypermetabolic mesenteric node adjacent to one of the previous mesenteric lymph nodes. However, there are no new hypermetabolic lesions in the neck, chest, or skeleton.  The small hypermetabolic peripheral lesion of the right kidney upper pole is slightly less hypermetabolic. 76/73/4193 PET scan:  Marked progression of disease as evidenced by progressive bulky mesenteric adenopathy, new low left internal jugular/left juxta diaphragmatic/abdominal retroperitoneal adenopathy, new pulmonary nodules, new hepatic and splenic lesions and new peritoneal nodules, all of which are hypermetabolic 79/07/4095 CT abdomen and pelvis: Slight increased size of left lower lobe pulmonary metastatic nodule.Re- demonstrated hypodense liver  masses and splenic masses, consistent with metastatic disease, suspect increased in size compared to prior PET-CT. Bulky mesenteric and retroperitoneal masses consistent with metastatic disease, also increased in size compared to recent PET-CT. Atrophic native kidneys. Transplant kidney in the right lower quadrant. Negative for hydronephrosis   Appointment on 02/24/2017  Component Date Value Ref Range Status  . WBC 02/24/2017 14.8* 3.8 - 10.6 K/uL Final  . RBC 02/24/2017 3.32* 4.40 - 5.90 MIL/uL Final  . Hemoglobin 02/24/2017 9.0* 13.0 - 18.0 g/dL Final  . HCT 02/24/2017 26.6* 40.0 - 52.0 % Final  . MCV 02/24/2017 80.0  80.0 - 100.0 fL Final  . MCH 02/24/2017 27.1  26.0 - 34.0 pg Final  . MCHC 02/24/2017 33.8  32.0 - 36.0 g/dL Final  . RDW 02/24/2017 20.6* 11.5 - 14.5 % Final  . Platelets 02/24/2017 245  150 - 440 K/uL Final  . Neutrophils Relative % 02/24/2017 84  % Final  . Neutro Abs 02/24/2017 12.5* 1.4 - 6.5 K/uL Final  . Lymphocytes Relative 02/24/2017 4  % Final  . Lymphs Abs 02/24/2017 0.6* 1.0 - 3.6 K/uL Final  . Monocytes Relative 02/24/2017 12  % Final  . Monocytes Absolute 02/24/2017 1.7* 0.2 - 1.0 K/uL Final  . Eosinophils Relative 02/24/2017 0  % Final  . Eosinophils Absolute 02/24/2017 0.0  0 - 0.7 K/uL Final  . Basophils Relative 02/24/2017 0  % Final  . Basophils Absolute 02/24/2017 0.0  0 - 0.1 K/uL Final  . Sodium 02/24/2017 135  135 - 145 mmol/L Final  . Potassium 02/24/2017 4.5  3.5 - 5.1 mmol/L Final  . Chloride 02/24/2017 103  101 - 111 mmol/L Final  . CO2 02/24/2017 27  22 - 32 mmol/L Final  . Glucose, Bld 02/24/2017 107* 65 - 99 mg/dL Final  . BUN 02/24/2017 25* 6 - 20 mg/dL Final  . Creatinine, Ser 02/24/2017 1.39* 0.61 - 1.24 mg/dL Final  . Calcium 02/24/2017 9.6  8.9 -  10.3 mg/dL Final  . Total Protein 02/24/2017 6.4* 6.5 - 8.1 g/dL Final  . Albumin 02/24/2017 3.4* 3.5 - 5.0 g/dL Final  . AST 02/24/2017 38  15 - 41 U/L Final  . ALT 02/24/2017 13* 17 -  63 U/L Final  . Alkaline Phosphatase 02/24/2017 118  38 - 126 U/L Final  . Total Bilirubin 02/24/2017 0.4  0.3 - 1.2 mg/dL Final  . GFR calc non Af Amer 02/24/2017 48* >60 mL/min Final  . GFR calc Af Amer 02/24/2017 55* >60 mL/min Final   Comment: (NOTE) The eGFR has been calculated using the CKD EPI equation. This calculation has not been validated in all clinical situations. eGFR's persistently <60 mL/min signify possible Chronic Kidney Disease.   . Anion gap 02/24/2017 5  5 - 15 Final    Assessment:  BRITON SELLMAN is a 76 y.o. male s/p renal transplant (2007) with a post-transplant lymphoproliferative disorder, stage IV diffuse large B cell lymphoma.  He presented with a 2-3 month history of progressive back pain superimposed on chronic back pain.    PET scan on 04/28/2016 revealed bulky intensely hypermetabolic periaortic upper abdominal and mesenteric adenopathy concerning for high-grade lymphoma.  There was hypermetabolic liver metastasis.  There was hpermetabolic lesion involving the small bowel of the upper pelvis.  There were multiple sites of hypermetabolic skeletal metastasis. There was moderate volume right pneumothorax.  CT guided retroperitoneal node biopsy on 04/30/2016 revealed diffuse large B cell lymphoma.  Hepatitis B and C testing were negative on 05/06/2016 and 02/03/2017.  Echo on 05/06/2016 revealed an EF of 55-60%.  Echo on 09/01/2016 revealed an EF of 60-65%.  Bone marrow aspirate and biopsy on 05/08/2016 revealed multifocal marrow involvement by diffuse large B-cell lymphoma. There was variably cellular marrow for age (50% - 90%) with a patchy predominantly nodular large B-cell infiltrate, overall estimated to account for 20% of the core biopsy. There was adequate residual trilineage hematopoiesis with mild nonspecific dyserythropoiesis. There was patchy mild increase in reticulin. Storage iron was present. The immunohistochemical staining pattern of the B-cell infiltrate  (CD10 +/-, BCL 6+, BCL-2 +) suggested possible large cell transformation of follicular lymphoma.  Flow cytometry revealed no significant immunophenotypic abnormalities or evidence of B-cell lymphoma.  He has bone metastasis and hypercalcemia.  Calcium was 11.2 (ionized 7.3) on 05/13/2016.  He received Zometa on 05/13/2016.  He began [place] on 06/09/2016 (last 08/05/2016).  Work-up on 04/15/2016 revealed the following normal studies: ferritin (343), iron saturation (6%), TIBC (241; low), B12 (525), folate (27).  Reticulocyte count was 2%.  LDH was 409.  Uric acid was 7.7 (4.4 - 7.6).  He was admitted at [facility] from 04/28/2016 - 04/30/2016 with a moderate volume right sided pneumothorax.  His pneumothorax improved spontaneously. Plain films of the right femur revealed the lucent bone lesion in the right femoral neck  was poorly characterized.  There was no evidence for an acute fracture.  PTH was 106 (high) 04/30/2016 with a calcium of 11.2.  Etiology was c/w primary hyperparathyroidism.  PTH-related polypeptide was < 1.1 on 04/30/2016.  He has a history of GI bleeding in 08/2014.  He underwent tagged RBC scan which revealed an active bleed in the hepatic flexure.  He was embolized in vascular interventional radiology.  He was admitted to [facility] from 07/08/2016 - 07/13/2016 with GI bleeding.  EGD on 07/09/2016 revealed multiple non-bleeding duodenal ulcer as well as duodenitis. Tagged RBC scan on 07/11/2016 revealed active GI bleed in the lateral right  mid abdomen coursing through multiple curvilinear bowel loops in the right mid abdomen favoring a small bowel source of bleeding.  He received 5 units of PRBCs, 2 units of pheresed platelets, and vitamin K from 07/07/2016 0 07/13/2016.  He was transferred to [facility] from 07/13/2016 - 07/16/2016.  [facility] performed a colonoscopy and push enteroscopy which showed several diverticula and old blood but no source of bleeding. Source of bleed was presumed to be  diverticular, although it was possible a small bowel site was not visualized on endoscopy.  He has a history of renal failure s/p renal transplant.  He is tacrolimus (Prograf), and steroids  Creatinine has ranged between 1.43 - 1.93 in the past 6 months. Mycophenolate (MMF) was discontinued at diagnosis.  Prograf level was 5.5 (3.0-8.0) on 05/08/2016, 2.2 on 08/13/2016, 2.3 in 01/01/2017.  He received 6 cycles of mini-RCHOP (05/09/2016 - 09/05/2016).  Cycle #1 was complicated by fever and neutropenia.  Cycle #3 was complicated by a GI bleed.  CSF on 06/19/2016 revealed 7 WBCs (4% segs, 79% lymphs, and 17% monocytes).  He has undergone LP with IT MTX x 4 (07/24/2016, 08/14/2016, 09/04/2016, and 10/02/2016).  Cytology was negative on 07/24/2016 and 09/04/2016.  PET scan on 10/10/2016 revealed slight worsening compared to the prior study. Several lesions were slightly more hypermetabolic and there appeared to be a newly enlarged and hypermetabolic mesenteric node adjacent to one of the previous mesenteric lymph nodes. However, there are no new hypermetabolic lesions in the neck, chest, or skeleton.  The small hypermetabolic peripheral lesion of the right kidney upper pole is slightly less hypermetabolic.  He was lost to follow-up.  PET scan on 01/14/2017 revealed marked progression of disease as evidenced by progressive bulky mesenteric adenopathy, new low left internal jugular/left juxta diaphragmatic/abdominal retroperitoneal adenopathy, new pulmonary nodules, new hepatic and splenic lesions and new peritoneal nodules, all of which are hypermetabolic.  LDH was 480 on 02/06/2017.  He is s/p cycle #1 gemcitabine and oxaliplatin + obinutuzumab (02/04/2017).   Symptomatically, he has RIGHT groin pain, improved.  He is eating better and gaining weight.  He denies any fevers or sweats.  Exam reveals improved right inguinal pain on palpation. Hemoglobin is 9.0, hematocrit 26.6, platelets 245,000. WBC 14.8  with an [place] 12,500.  Renal function has improved.  Uric acid is normal.   Plan:  1.  Labs today: CBC with diff, CMP, uric acid. 2.  Day 1 of cycle #2 gemcitabine and oxaliplatin + obinutuzumab with OnPro Neulasta support. 3.  Discuss anemia.  Patient asymptomatic.  Continue to observe without intervention. 4.  Discuss improvement in renal function. Patient voiding normally. He is drinking more. Encouraged to continue to increase oral fluid intake. 5.  Increase ibuprofen to 485m PO BID.   Close monitoring of renal function. 6.  RTC on 09/07/20118 for MD assessment, labs (CBC with diff, BMP, uric acid), and consideration of gabapentin.  BHonor Loh NP 02/24/2017 ,9:14 AM    The patient was accompanied by BOverton Mam NP, during the exam. I saw and evaluated the patient, participating in the key portions of the service and reviewing pertinent diagnostic studies and records.  I reviewed the nurse practitioner's note and agree with the findings and the plan.  The assessment and plan were discussed with the patient.   A few questions were asked by the patient and answered.   MLequita Asal MD 02/24/2017,11:17 AM

## 2017-02-24 NOTE — Progress Notes (Signed)
Patient states after getting his injections last week in his right groin he has had increased pain.  States it interfered with his sleep a couple of nights.  Otherwise, no complaints.

## 2017-02-25 MED FILL — PROGRAF/1MG/CAP: PROGRAF/1MG/CAP | 30 days supply | Qty: 180 | Fill #1

## 2017-02-27 ENCOUNTER — Encounter: Payer: Self-pay | Admitting: Hematology and Oncology

## 2017-02-27 ENCOUNTER — Inpatient Hospital Stay: Payer: Medicare HMO

## 2017-02-27 ENCOUNTER — Inpatient Hospital Stay (HOSPITAL_BASED_OUTPATIENT_CLINIC_OR_DEPARTMENT_OTHER): Payer: Medicare HMO | Admitting: Hematology and Oncology

## 2017-02-27 ENCOUNTER — Other Ambulatory Visit: Payer: Self-pay | Admitting: *Deleted

## 2017-02-27 ENCOUNTER — Telehealth: Payer: Self-pay | Admitting: *Deleted

## 2017-02-27 VITALS — BP 132/52 | HR 53 | Temp 96.5°F | Wt 121.4 lb

## 2017-02-27 DIAGNOSIS — D649 Anemia, unspecified: Secondary | ICD-10-CM | POA: Diagnosis not present

## 2017-02-27 DIAGNOSIS — I4891 Unspecified atrial fibrillation: Secondary | ICD-10-CM

## 2017-02-27 DIAGNOSIS — K219 Gastro-esophageal reflux disease without esophagitis: Secondary | ICD-10-CM | POA: Diagnosis not present

## 2017-02-27 DIAGNOSIS — N400 Enlarged prostate without lower urinary tract symptoms: Secondary | ICD-10-CM | POA: Diagnosis not present

## 2017-02-27 DIAGNOSIS — C7889 Secondary malignant neoplasm of other digestive organs: Secondary | ICD-10-CM

## 2017-02-27 DIAGNOSIS — C787 Secondary malignant neoplasm of liver and intrahepatic bile duct: Secondary | ICD-10-CM

## 2017-02-27 DIAGNOSIS — Z940 Kidney transplant status: Secondary | ICD-10-CM

## 2017-02-27 DIAGNOSIS — R1031 Right lower quadrant pain: Secondary | ICD-10-CM

## 2017-02-27 DIAGNOSIS — N186 End stage renal disease: Secondary | ICD-10-CM | POA: Diagnosis not present

## 2017-02-27 DIAGNOSIS — Z7189 Other specified counseling: Secondary | ICD-10-CM

## 2017-02-27 DIAGNOSIS — I272 Pulmonary hypertension, unspecified: Secondary | ICD-10-CM

## 2017-02-27 DIAGNOSIS — C8330 Diffuse large B-cell lymphoma, unspecified site: Secondary | ICD-10-CM

## 2017-02-27 DIAGNOSIS — M1990 Unspecified osteoarthritis, unspecified site: Secondary | ICD-10-CM

## 2017-02-27 DIAGNOSIS — I129 Hypertensive chronic kidney disease with stage 1 through stage 4 chronic kidney disease, or unspecified chronic kidney disease: Secondary | ICD-10-CM

## 2017-02-27 DIAGNOSIS — N491 Inflammatory disorders of spermatic cord, tunica vaginalis and vas deferens: Secondary | ICD-10-CM

## 2017-02-27 DIAGNOSIS — Z87891 Personal history of nicotine dependence: Secondary | ICD-10-CM

## 2017-02-27 DIAGNOSIS — E210 Primary hyperparathyroidism: Secondary | ICD-10-CM | POA: Diagnosis not present

## 2017-02-27 DIAGNOSIS — E785 Hyperlipidemia, unspecified: Secondary | ICD-10-CM

## 2017-02-27 DIAGNOSIS — M109 Gout, unspecified: Secondary | ICD-10-CM | POA: Diagnosis not present

## 2017-02-27 DIAGNOSIS — Z800 Family history of malignant neoplasm of digestive organs: Secondary | ICD-10-CM

## 2017-02-27 DIAGNOSIS — D47Z1 Post-transplant lymphoproliferative disorder (PTLD): Secondary | ICD-10-CM | POA: Diagnosis not present

## 2017-02-27 DIAGNOSIS — Z79899 Other long term (current) drug therapy: Secondary | ICD-10-CM

## 2017-02-27 DIAGNOSIS — Z5112 Encounter for antineoplastic immunotherapy: Secondary | ICD-10-CM | POA: Diagnosis not present

## 2017-02-27 LAB — CBC WITH DIFFERENTIAL/PLATELET
Basophils Absolute: 0 10*3/uL (ref 0–0.1)
Basophils Relative: 0 %
Eosinophils Absolute: 0 10*3/uL (ref 0–0.7)
Eosinophils Relative: 0 %
HCT: 24.6 % — ABNORMAL LOW (ref 40.0–52.0)
Hemoglobin: 8.1 g/dL — ABNORMAL LOW (ref 13.0–18.0)
Lymphocytes Relative: 1 %
Lymphs Abs: 0.5 10*3/uL — ABNORMAL LOW (ref 1.0–3.6)
MCH: 26.6 pg (ref 26.0–34.0)
MCHC: 33.1 g/dL (ref 32.0–36.0)
MCV: 80.5 fL (ref 80.0–100.0)
Monocytes Absolute: 0.2 10*3/uL (ref 0.2–1.0)
Monocytes Relative: 1 %
Neutro Abs: 40.8 10*3/uL — ABNORMAL HIGH (ref 1.4–6.5)
Neutrophils Relative %: 98 %
Platelets: 173 10*3/uL (ref 150–440)
RBC: 3.06 MIL/uL — ABNORMAL LOW (ref 4.40–5.90)
RDW: 21.1 % — ABNORMAL HIGH (ref 11.5–14.5)
WBC: 41.6 10*3/uL — ABNORMAL HIGH (ref 3.8–10.6)

## 2017-02-27 LAB — COMPREHENSIVE METABOLIC PANEL WITH GFR
ALT: 14 U/L — ABNORMAL LOW (ref 17–63)
AST: 38 U/L (ref 15–41)
Albumin: 3.3 g/dL — ABNORMAL LOW (ref 3.5–5.0)
Alkaline Phosphatase: 127 U/L — ABNORMAL HIGH (ref 38–126)
Anion gap: 9 (ref 5–15)
BUN: 36 mg/dL — ABNORMAL HIGH (ref 6–20)
CO2: 21 mmol/L — ABNORMAL LOW (ref 22–32)
Calcium: 9.7 mg/dL (ref 8.9–10.3)
Chloride: 107 mmol/L (ref 101–111)
Creatinine, Ser: 1.47 mg/dL — ABNORMAL HIGH (ref 0.61–1.24)
GFR calc Af Amer: 52 mL/min — ABNORMAL LOW
GFR calc non Af Amer: 45 mL/min — ABNORMAL LOW
Glucose, Bld: 126 mg/dL — ABNORMAL HIGH (ref 65–99)
Potassium: 5.3 mmol/L — ABNORMAL HIGH (ref 3.5–5.1)
Sodium: 137 mmol/L (ref 135–145)
Total Bilirubin: 0.7 mg/dL (ref 0.3–1.2)
Total Protein: 6 g/dL — ABNORMAL LOW (ref 6.5–8.1)

## 2017-02-27 LAB — URIC ACID: Uric Acid, Serum: 8.2 mg/dL — ABNORMAL HIGH (ref 4.4–7.6)

## 2017-02-27 NOTE — Progress Notes (Signed)
Patient offers no complaints today. 

## 2017-02-27 NOTE — Progress Notes (Signed)
[facility] day:  02/27/2017   Chief Complaint: Matthew Brown is a 76 y.o. male with post-transplant lymphoproliferative disorder, stage IVBE diffuse large B cell lymphoma, who is seen for assessment on day 4 s/p cycle #2 gemcitabine and oxaliplatin + obinutuzumab.  HPI:  The patient was last seen in the medical oncology symptom management clinic on 02/24/2017.  At that time, he continued to have right groin pain s/p lidocaine spermatic cord block.  However, pain had improved.  He was to take ibuprofen 200-400 mg BID prn for pain and consider gabapentin if his pain remained problematic.  He was eating better.  He had gained weight.  He received cycle #2 chemotherapy.  During the interim, he has felt "great".  He is smiling and reports that he has no pain. Patient taking Ibuprofen 200 mg BID. He did not increase the dose to 400 mg BID as recommended citing that the lower dose was controlling his pain. He is currently on a 10 day course of Keflex as prescribed by Dr. Jamal Collin. He is eating well.  Weight is up 1 pound. He denies any chest pain, shortness of breath, fever, sweats, and any numbness/tingling.    Past Medical History:  Diagnosis Date  . Benign prostatic hypertrophy   . Chronic headache 10/19/2015  . ED (erectile dysfunction)   . End stage renal disease ([place])   . Essential hypertension   . GERD (gastroesophageal reflux disease)   . GIB (gastrointestinal bleeding)    a. 0/1093 s/p R colic artery embolization;  b. 02/2015 EGD: duod ulcerative mass->Bx notable for coagulative necrosis - ? ischemia vs thrombosis-->coumadin d/c'd.  . Gout   . Hearing loss   . Hemorrhoids   . Hyperlipidemia   . Lymphoma ([place])   . Lymphoma ([place]) 2017  . Multiple thyroid nodules 06/06/2016   Noted on carotid US; dedicated US to be ordered by staff  . Osteoarthrosis, unspecified whether generalized or localized, lower leg   . Persistent atrial fibrillation ([place])     a. CHA2DS2VASc = 3-->coumadin d/c'd 02/2015 2/2 recurrent GIB.  [name] Prostatitis   . Pulmonary hypertension ([place])    a. 10/2014 Echo: EF 60-65%, mild to mod MR, mildly dil LA, nl RV, PASP 79mHg.  .Marland KitchenRenal transplant recipient   . Ulcers of both great toes ([facility]     Past Surgical History:  Procedure Laterality Date  . AV FISTULA PLACEMENT  1998  . BACK SURGERY    . ESOPHAGOGASTRODUODENOSCOPY  03/13/15   severe esophagitis, ulcerated mass  . ESOPHAGOGASTRODUODENOSCOPY (EGD) WITH PROPOFOL N/A 07/09/2016   Procedure: ESOPHAGOGASTRODUODENOSCOPY (EGD) WITH PROPOFOL;  Surgeon: KJonathon Bellows MD;  Location: [facility] ENDOSCOPY;  Service: Endoscopy;  Laterality: N/A;  . HERNIA REPAIR  1974  . KIDNEY TRANSPLANT  2006  . PERIPHERAL VASCULAR CATHETERIZATION N/A 05/07/2016   Procedure: PGlori LuisCath Insertion;  Surgeon: JAlgernon Huxley MD;  Location: [facility];  Service: Cardiovascular;  Laterality: N/A;  . PROSTATE ABLATION    . STOMACH SURGERY     blood vessel burst  . THROAT SURGERY    . TOTAL KNEE ARTHROPLASTY      Family History  Problem Relation Age of Onset  . Cancer Mother        throat  . Diabetes Brother   . Heart disease Brother   . Stroke Brother   . Hypertension Brother   . Diabetes Sister   . Heart disease Sister   .  Hypertension Sister   . Diabetes Sister   . Diabetes Brother   . COPD Neg Hx   . Kidney disease Neg Hx   . Prostate cancer Neg Hx   . Kidney cancer Neg Hx   . Bladder Cancer Neg Hx     Social History:  reports that he quit smoking about 38 years ago. His smoking use included Cigarettes. He has a 25.00 pack-year smoking history. He has never used smokeless tobacco. He reports that he does not drink alcohol or use drugs.  He stopped smoking in 1981.  He smoked 3 cigarettes/day.  He lives in [place].  The patient is accompanied by his wife, [name],  today.  Allergies: No Known Allergies  Current Medications: Current Outpatient Prescriptions  Medication Sig  Dispense Refill  . acetaminophen (TYLENOL) 325 MG tablet Take 2 tablets (650 mg total) by mouth every 6 (six) hours as needed for mild pain (or Fever >/= 101).    [name] albuterol (PROAIR HFA) 108 (90 BASE) MCG/ACT inhaler Inhale 1-2 puffs into the lungs every 4 (four) hours as needed.     [name] allopurinol (ZYLOPRIM) 100 MG tablet Take 100 mg by mouth daily.      . cephALEXin (KEFLEX) 500 MG capsule Take 1 capsule (500 mg total) by mouth 2 (two) times daily. 20 capsule 0  . COLCRYS 0.6 MG tablet Take 1 tablet by mouth 2 (two) times daily as needed.    . diphenhydrAMINE (BENADRYL) 25 mg capsule Take 1 capsule (25 mg total) by mouth at bedtime as needed for sleep. 30 capsule 0  . feeding supplement, ENSURE ENLIVE, (ENSURE ENLIVE) LIQD Take 237 mLs by mouth 3 (three) times daily between meals. 90 Bottle 0  . finasteride (PROSCAR) 5 MG tablet Take 1 tablet (5 mg total) by mouth daily. 90 tablet 3  . furosemide (LASIX) 20 MG tablet Take 20 mg by mouth every other day. Take 1-2 tablets daily.    . hydroxypropyl methylcellulose (ISOPTO TEARS) 2.5 % ophthalmic solution Place 1 drop into both eyes as needed.     . metoprolol tartrate (LOPRESSOR) 25 MG tablet Take 12.5 mg by mouth 2 (two) times daily.     . Multiple Vitamin (MULTIVITAMIN) tablet Take 1 tablet by mouth daily.      . ondansetron (ZOFRAN) 4 MG tablet Take 1 tablet (4 mg total) by mouth every 6 (six) hours as needed for nausea. 20 tablet 0  . oxybutynin (DITROPAN-XL) 5 MG 24 hr tablet Take 1 tablet (5 mg total) by mouth daily. 90 tablet 3  . pantoprazole (PROTONIX) 40 MG tablet TAKE 1 TABLET BY MOUTH TWICE A DAY 60 tablet 1  . pantoprazole (PROTONIX) 40 MG tablet TAKE 1 TABLET BY MOUTH TWICE A DAY 60 tablet 1  . predniSONE (DELTASONE) 20 MG tablet as directed.     . predniSONE (DELTASONE) 5 MG tablet Take 5 mg by mouth daily.     . simethicone (MYLICON) 80 MG chewable tablet Chew 1 tablet (80 mg total) by mouth every 6 (six) hours as needed for  flatulence. 120 tablet 0  . sucralfate (CARAFATE) 1 g tablet Take 1 tablet (1 g total) by mouth 4 (four) times daily. Resume taking after one week- once finished taking oral levaquine. ( to avoid interaction.) 40 tablet 3  . tacrolimus (PROGRAF) 1 MG capsule Take 3 mg by mouth 2 (two) times daily. Reported on 08/16/2015    . tamsulosin (FLOMAX) 0.4 MG CAPS capsule Take 1 capsule (0.4  mg total) by mouth daily. 90 capsule 3  . traZODone (DESYREL) 50 MG tablet 1-2 TABS AS NEEDED FOR SLEEP  3  . oxyCODONE-acetaminophen (ROXICET) 5-325 MG tablet Take 1 tablet by mouth every 6 (six) hours as needed for severe pain. (Patient not taking: Reported on 02/27/2017) 20 tablet 0   No current facility-administered medications for this visit.    Facility-Administered Medications Ordered in Other Visits  Medication Dose Route Frequency Provider Last Rate Last Dose  . sodium chloride flush (NS) 0.9 % injection 10 mL  10 mL Intracatheter PRN Lequita Asal, MD        Review of Systems:  GENERAL:  Feels "great".  No fevers or sweats.  Weight up 1 pounds since last visit.  PERFORMANCE STATUS (ECOG):  1 HEENT:  No visual changes, sore throat, mouth sores or tenderness. Lungs: No shortness of breath or cough.  No hemoptysis. Cardiac:  No chest pain, palpitations, orthopnea, or PND. GI:  Eating better.  No nausea, vomiting, diarrhea, constipation, melena or hematochezia. GU:  Enlarged prostate.  No urgency, frequency, dysuria, or hematuria.  Left inguinal hernia. Right inguinal pain (resolved). Musculoskeletal: No back pain.  No joint pain.  No muscle tenderness. Extremities:  No pain or swelling. Skin:  Nail changes.  No rashes or skin changes. Neuro:  No headache, numbness or weakness, balance or coordination issues. Endocrine:  No diabetes, thyroid issues, hot flashes or night sweats. Psych:  No mood changes, depression or anxiety. Pain: Denies   Review of systems:  All other systems reviewed and found to  be negative.  Physical Exam: Blood pressure (!) 132/52, pulse (!) 53, temperature (!) 96.5 F (35.8 C), temperature source Tympanic, weight 121 lb 6 oz (55.1 kg). GENERAL:  Thin elderly gentleman sitting comfortably in the exam room in no acute distress. He has a cane at his side. MENTAL STATUS:  Alert and oriented to person, place and time. HEAD:  Lu Duffel.  Temporal wasting.  Normocephalic, atraumatic, face symmetric, no Cushingoid features. EYES:  Glasses.  Brown eyes.  Pupils equal round and reactive to light and accomodation.  No conjunctivitis or scleral icterus. ENT:  Oropharynx clear without lesion.  Edentulous.  Tongue normal. Mucous membranes moist.  RESPIRATORY:  Clear to auscultation without rales, wheezes or rhonchi. CARDIOVASCULAR:  Regular rate and rhythm without murmur, rub or gallop. ABDOMEN:  Soft, non-tender, with active bowel sounds, and no hepatosplenomegaly.  No masses.  Palpable transplanted kidney.  SKIN:  No rashes, ulcers or lesions. EXTREMITIES:  No edema, no skin discoloration or tenderness.  Right upper extremity with dilated vascular s/p AV fistula (chronic).  No palpable cords. NEUROLOGICAL: Unremarkable. PSYCH:  Appropriate.   Imaging studies:  04/28/2016 PET scan:  Bulky intensely hypermetabolic periaortic upper abdominal and mesenteric adenopathy concerning for high-grade lymphoma.  There was hypermetabolic liver metastasis.  There was hpermetabolic lesion involving the small bowel of the upper pelvis.  There were multiple sites of hypermetabolic skeletal metastasis. There was moderate volume right pneumothorax. 08/12/2016 PET scan:  Interval response to therapy. There has been significant decrease in extent of hypermetabolic tumor within the neck, chest, abdomen and pelvis as well as the axial and appendicular skeleton.  There was residual enlarged and hypermetabolic small bowel mesenteric and periaortic lymph nodes. There was a persistent hypermetabolic  focus of increased uptake within the spleen.  There was resolution of previous multifocal hypermetabolic lesions within the liver. 10/10/2016 PET scan:  Slight worsening compared to the prior study. Several  lesions were slightly more hypermetabolic and there appeared to be a newly enlarged and hypermetabolic mesenteric node adjacent to one of the previous mesenteric lymph nodes. However, there are no new hypermetabolic lesions in the neck, chest, or skeleton.  The small hypermetabolic peripheral lesion of the right kidney upper pole is slightly less hypermetabolic. 70/96/2836 PET scan:  Marked progression of disease as evidenced by progressive bulky mesenteric adenopathy, new low left internal jugular/left juxta diaphragmatic/abdominal retroperitoneal adenopathy, new pulmonary nodules, new hepatic and splenic lesions and new peritoneal nodules, all of which are hypermetabolic 62/94/7654 CT abdomen and pelvis: Slight increased size of left lower lobe pulmonary metastatic nodule. Re-demonstrated hypodense liver masses and splenic masses, consistent with metastatic disease, suspect increased in size compared to prior PET-CT. Bulky mesenteric and retroperitoneal masses consistent with metastatic disease, also increased in size compared to recent PET-CT. Atrophic native kidneys. Transplant kidney in the right lower quadrant. Negative for hydronephrosis   Appointment on 02/27/2017  Component Date Value Ref Range Status  . WBC 02/27/2017 41.6* 3.8 - 10.6 K/uL Final  . RBC 02/27/2017 3.06* 4.40 - 5.90 MIL/uL Final  . Hemoglobin 02/27/2017 8.1* 13.0 - 18.0 g/dL Final  . HCT 02/27/2017 24.6* 40.0 - 52.0 % Final  . MCV 02/27/2017 80.5  80.0 - 100.0 fL Final  . MCH 02/27/2017 26.6  26.0 - 34.0 pg Final  . MCHC 02/27/2017 33.1  32.0 - 36.0 g/dL Final  . RDW 02/27/2017 21.1* 11.5 - 14.5 % Final  . Platelets 02/27/2017 173  150 - 440 K/uL Final  . Neutrophils Relative % 02/27/2017 98  % Final  . Neutro Abs  02/27/2017 40.8* 1.4 - 6.5 K/uL Final  . Lymphocytes Relative 02/27/2017 1  % Final  . Lymphs Abs 02/27/2017 0.5* 1.0 - 3.6 K/uL Final  . Monocytes Relative 02/27/2017 1  % Final  . Monocytes Absolute 02/27/2017 0.2  0.2 - 1.0 K/uL Final  . Eosinophils Relative 02/27/2017 0  % Final  . Eosinophils Absolute 02/27/2017 0.0  0 - 0.7 K/uL Final  . Basophils Relative 02/27/2017 0  % Final  . Basophils Absolute 02/27/2017 0.0  0 - 0.1 K/uL Final  . Sodium 02/27/2017 137  135 - 145 mmol/L Final  . Potassium 02/27/2017 5.3* 3.5 - 5.1 mmol/L Final  . Chloride 02/27/2017 107  101 - 111 mmol/L Final  . CO2 02/27/2017 21* 22 - 32 mmol/L Final  . Glucose, Bld 02/27/2017 126* 65 - 99 mg/dL Final  . BUN 02/27/2017 36* 6 - 20 mg/dL Final  . Creatinine, Ser 02/27/2017 1.47* 0.61 - 1.24 mg/dL Final  . Calcium 02/27/2017 9.7  8.9 - 10.3 mg/dL Final  . Total Protein 02/27/2017 6.0* 6.5 - 8.1 g/dL Final  . Albumin 02/27/2017 3.3* 3.5 - 5.0 g/dL Final  . AST 02/27/2017 38  15 - 41 U/L Final  . ALT 02/27/2017 14* 17 - 63 U/L Final  . Alkaline Phosphatase 02/27/2017 127* 38 - 126 U/L Final  . Total Bilirubin 02/27/2017 0.7  0.3 - 1.2 mg/dL Final  . GFR calc non Af Amer 02/27/2017 45* >60 mL/min Final  . GFR calc Af Amer 02/27/2017 52* >60 mL/min Final   Comment: (NOTE) The eGFR has been calculated using the CKD EPI equation. This calculation has not been validated in all clinical situations. eGFR's persistently <60 mL/min signify possible Chronic Kidney Disease.   . Anion gap 02/27/2017 9  5 - 15 Final    Assessment:  Matthew Brown is a 76  y.o. male s/p renal transplant (2007) with a post-transplant lymphoproliferative disorder, stage IV diffuse large B cell lymphoma.  He presented with a 2-3 month history of progressive back pain superimposed on chronic back pain.    PET scan on 04/28/2016 revealed bulky intensely hypermetabolic periaortic upper abdominal and mesenteric adenopathy concerning for  high-grade lymphoma.  There was hypermetabolic liver metastasis.  There was hpermetabolic lesion involving the small bowel of the upper pelvis.  There were multiple sites of hypermetabolic skeletal metastasis. There was moderate volume right pneumothorax.  CT guided retroperitoneal node biopsy on 04/30/2016 revealed diffuse large B cell lymphoma.  Hepatitis B and C testing were negative on 05/06/2016 and 02/03/2017.  Echo on 05/06/2016 revealed an EF of 55-60%.  Echo on 09/01/2016 revealed an EF of 60-65%.  Bone marrow aspirate and biopsy on 05/08/2016 revealed multifocal marrow involvement by diffuse large B-cell lymphoma. There was variably cellular marrow for age (50% - 90%) with a patchy predominantly nodular large B-cell infiltrate, overall estimated to account for 20% of the core biopsy. There was adequate residual trilineage hematopoiesis with mild nonspecific dyserythropoiesis. There was patchy mild increase in reticulin. Storage iron was present. The immunohistochemical staining pattern of the B-cell infiltrate (CD10 +/-, BCL 6+, BCL-2 +) suggested possible large cell transformation of follicular lymphoma.  Flow cytometry revealed no significant immunophenotypic abnormalities or evidence of B-cell lymphoma.  He has bone metastasis and hypercalcemia.  Calcium was 11.2 (ionized 7.3) on 05/13/2016.  He received Zometa on 05/13/2016.  He began [place] on 06/09/2016 (last 08/05/2016).  Work-up on 04/15/2016 revealed the following normal studies: ferritin (343), iron saturation (6%), TIBC (241; low), B12 (525), folate (27).  Reticulocyte count was 2%.  LDH was 409.  Uric acid was 7.7 (4.4 - 7.6).  He was admitted at [facility] from 04/28/2016 - 04/30/2016 with a moderate volume right sided pneumothorax.  His pneumothorax improved spontaneously. Plain films of the right femur revealed the lucent bone lesion in the right femoral neck  was poorly characterized.  There was no evidence for an acute fracture.  PTH  was 106 (high) 04/30/2016 with a calcium of 11.2.  Etiology was c/w primary hyperparathyroidism.  PTH-related polypeptide was < 1.1 on 04/30/2016.  He has a history of GI bleeding in 08/2014.  He underwent tagged RBC scan which revealed an active bleed in the hepatic flexure.  He was embolized in vascular interventional radiology.  He was admitted to [facility] from 07/08/2016 - 07/13/2016 with GI bleeding.  EGD on 07/09/2016 revealed multiple non-bleeding duodenal ulcer as well as duodenitis. Tagged RBC scan on 07/11/2016 revealed active GI bleed in the lateral right mid abdomen coursing through multiple curvilinear bowel loops in the right mid abdomen favoring a small bowel source of bleeding.  He received 5 units of PRBCs, 2 units of pheresed platelets, and vitamin K from 07/07/2016 0 07/13/2016.  He was transferred to [facility] from 07/13/2016 - 07/16/2016.  [facility] performed a colonoscopy and push enteroscopy which showed several diverticula and old blood but no source of bleeding. Source of bleed was presumed to be diverticular, although it was possible a small bowel site was not visualized on endoscopy.  He has a history of renal failure s/p renal transplant.  He is tacrolimus (Prograf), and steroids  Creatinine has ranged between 1.43 - 1.93 in the past 6 months. Mycophenolate (MMF) was discontinued at diagnosis.  Prograf level was 5.5 (3.0-8.0) on 05/08/2016, 2.2 on 08/13/2016, 2.3 in 01/01/2017.  He received 6 cycles of  mini-RCHOP (05/09/2016 - 09/05/2016).  Cycle #1 was complicated by fever and neutropenia.  Cycle #3 was complicated by a GI bleed.  CSF on 06/19/2016 revealed 7 WBCs (4% segs, 79% lymphs, and 17% monocytes).  He has undergone LP with IT MTX x 4 (07/24/2016, 08/14/2016, 09/04/2016, and 10/02/2016).  Cytology was negative on 07/24/2016 and 09/04/2016.  PET scan on 10/10/2016 revealed slight worsening compared to the prior study. Several lesions were slightly more hypermetabolic and there  appeared to be a newly enlarged and hypermetabolic mesenteric node adjacent to one of the previous mesenteric lymph nodes. However, there are no new hypermetabolic lesions in the neck, chest, or skeleton.  The small hypermetabolic peripheral lesion of the right kidney upper pole is slightly less hypermetabolic.  He was lost to follow-up.  PET scan on 01/14/2017 revealed marked progression of disease as evidenced by progressive bulky mesenteric adenopathy, new low left internal jugular/left juxta diaphragmatic/abdominal retroperitoneal adenopathy, new pulmonary nodules, new hepatic and splenic lesions and new peritoneal nodules, all of which are hypermetabolic.  LDH was 480 on 02/06/2017.  He is day 4 s/p cycle #2 gemcitabine and oxaliplatin + obinutuzumab (02/04/2017 - 02/24/2017) with Neulasta support.   Symptomatically, he feels "great".  He any denies pain.  He has no physical complaints today.He is eating better and gaining weight.  He denies any fevers or sweats.  Exam reveals resolved right inguinal pain on palpation. Hemoglobin is 8.1 and hematocrit 24.6.  WBC is 41,600 with an Valentine. s/p Neulasta.  Creatinine is 1.47.  Plan:  1.  Labs today: CBC with diff, BMP, uric acid. 2.  Discussed anemia.  Patient asymptomatic.  Continue to observe without intervention. 3.  Follow up with Dr. Suzan Nailer on 03/04/2017 as scheduled.    4.  RTC on 03/05/2017 for labs (CBC with diff, hold tube), +/- irradiated PRBC transfusion. 5.  RTC on 03/10/2017 MD assessment, labs (CBC with diff, BMP, uric acid, LDH), and day 1 of cycle #3 gemcitabine + oxaliplatin + obinutuzumab   Honor Loh, NP 02/27/2017,10:59 AM    I saw and evaluated the patient, participating in the key portions of the service and reviewing pertinent diagnostic studies and records.  I reviewed the nurse practitioner's note and agree with the findings and the plan.  The assessment and plan were discussed with the patient.  A few questions were  asked by the patient and answered.   Lequita Asal, MD 02/27/2017,2:44 PM

## 2017-02-27 NOTE — Telephone Encounter (Signed)
Called and spoke to patient's significant other and told her to relay to the patient that patient's labs reveal that he has some dehydration.  MD recommends patient increase his water intake and also to refrain from K+ rich foods due to uric acid levels.  Verbalized understanding.

## 2017-03-02 ENCOUNTER — Telehealth: Payer: Self-pay

## 2017-03-02 NOTE — Telephone Encounter (Signed)
Nutrition  Received call from significant other for patient requesting a list of foods that were high in potassium.   Will mail list of foods to patient address.  Kennetta Pavlovic B. Zenia Resides, [place], [place] Registered Dietitian [phone] (pager)

## 2017-03-03 NOTE — Unmapped (Signed)
Transplant Nephrology Clinic Visit      Primary Oncologist: Nelva Nay, MD    History of Present Illness    Patient is a 76 y.o. year old male who underwent deceased donor transplant on Nov 30, 2004 secondary to hypertensive nephrosclerosis. He presents today for a routine follow up. His post transplant course has been complicated by elevated creatinine in November 2012. His previous baseline creatinine has been between 0.8 and 1.8 but was noted to go up to 2.  He underwent biopsy on 05/30/2011 when his creatinine was 2.4. That biopsy showed possible focal acute interstitial nephritis versus focal tubular interstitial cellular rejection. He received 3 days of pulse Solu-Medrol and a prednisone taper, with return of his creatinine to baseline. Patient does not have evidence of donor specific antibodies.    In March 2016 he was admitted for GI bleed with a hemoglobin of 5.8. Tagged red blood cell scan showed an active bleed and hepatic flexure and he was embolized in VIR. He recovered well from that episode.    He was admitted from 9/11 through 03/22/2015. He was found to have a right sided pneumonia and blood cultures which ultimately grew Haemophilus influenza. He was intubated at the onset of his hospitalization for about 36 hours and was able to be extubated. He also had abdominal pain and hematemesis, and ultimately had an EGD due to drop in his hemoglobin. That showed an ulcerated mass in his duodenum with biopsy consistent with coagulative necrosis likely from ischemia or thrombosis. He was transitioned from IV to oral Protonix. Warfarin was held. He did have ongoing abdominal pain and distention during this hospitalization. His creatinine peaked at 2.4 in the hospital, was 1.8 on discharge. Previous to this hospitalization had been between 1.2 and 1.6.    At his hospital follow-up visit on 04/04/2015, he was continuing to have very significant abdominal pain and minimal by mouth intake. For that reason, he was readmitted from 10/12 through 04/17/2015. He underwent MRA due to the concern for mesenteric ischemia, that showed pancreatitis and duodenitis. The distribution of the inflammation did not seem to suggest ischemia. His venous lactate was normal. His lipase was elevated at 3545. He was initially started on IV fluids though his respiratory status declined. Chest x-ray showed worsening pleural effusions. He was transferred to the medical ICU and placed on BiPAP. His respiratory status improved and he was weaned back to nasal cannula. He underwent thoracentesis on 10/15. 800 mL was removed and it was transudative in nature, thought related to the pancreatitis. His abdominal pain improved and his diet was advanced. He continued to have abdominal bloating in the hospital which responded well to simethicone. He was also started on Carafate. His creatinine peaked at 2.96 on hospital day 2, associated with some elevated tacrolimus levels. Creatinine was 1.09 on discharge, 1.26 on 10/31.    At his last visit in November, he was having some issues with lower extremity edema. He was on Lasix 80 mg in the morning, but he had an elevated CO2 of 35 and hypokalemia. We decreased his Lasix to 40 mg in the morning and 20 mg in the afternoon. Following that change, he had increasing lower extremity edema, therefore his Lasix was increased as an outpatient, up to 120 mg daily. In spite of the increase, he had progressively decreasing urine output and increased shortness of breath.    Due to these symptoms, we directly admitted him to [facility]. He was admitted there from 1/24 through 07/24/2015. He was  significantly 5 overloaded and responded well to diuresis. It was felt that his high dose of beta blockers contributed to the development of his volume overload. He also underwent thoracentesis of a complex right-sided pleural effusion with 1.4 L of fluid removed. That also improved his oxygenation. He was discharged on 80 mg of Lasix twice a day. His creatinine was 2 on admission, down to 1.4 on discharge. He was seen by cardiology in house due to some abnormal cardiac rhythms. They recommended continuing his metoprolol at a decreased dose. His ectopy was attributed to volume overload. They did recommend starting apixaban due to his history of atrial fibrillation, however he has had a recent GI bleed and is a high risk for rebleeding therefore was not discharged on any anticoagulation.    He was diagnosed with stage 4 B-cell lymphoma on 05/06/17. He has received R-miniCHOP in [place]. We discontinued his Myfortic in the setting of malignancy and continued Prograf and prednisone. Unfortunately his disease recurred within 3 months.    Patient was admitted on 07/13/16-07/16/16 for GI bleed with a hemoglobin of 6.8 and grossly heme positive stools. A tagged RBC scan was completed on 1/19 which showed a presence of a bleed in the lateral right mid abdomen coursing through multiple curvilinear bowel loops in the right mid abdomen concerning for a small bowel bleed. Patient became hypotensive and hemoglobin dropped from 7.5 to 5 and patient was transferred to the ICU. He was fluid resuscitated and received an additional 2U of pRBC. Patient was transferred for consideration of small bowel enteroscopy. Hemoglobin remained stable after being transferred to [facility]. Patient was discharged was transitioned to his home omeprazole. They did not perform a CBC on day of discharge.     At his visit 11/04/16, he was continuing to follow with his oncologist in [place]. PET scan 10/10/16 showed some progression of disease and there was discussion of salvage chemotherapy.    Interval History since last seen on 11/04/2016    Patient visited the ED on 6/13 for prolonged numbness on the right side of his face with pain radiating from his shoulders to fingertips for 2 days prior to ED visit. Although he denied any facial droop, extremity weakness, headache, or acute neurologic dysfunction, his presentation was concerning for a stroke. CT head showed Region of encephalomalacia in the left occipital lobe and hypoattenuation in the left posterior limb of the internal capsule, likely secondary to remote ischemia in the distribution of the left posterior cerebral artery. He was discharged home. They thought he would benefit from a MRA neck or carotid US as an outpatient.     Due to his persistent a fib and inability to take anticoagulation (severe GI bleeds in the past) there was some talk by his local cardiologist about placing a Watchman device. He saw [facility] cardiology on 12/19/16 for a second opinion. Due to his poor prognosis from his cancer it was felt that he may not be a candidate, and were waiting to hear more about his chemotherapy plan. He had a Zio patch placed on 7/9, on 7/24 was noted to be in a fib 100% of the time with 33 episodes of NSVT with max heart rate 250 beats and longest run 24 beats.    He saw [facility] oncology on 01/23/17 for opinion regarding his lymphoma treatment. Per their assessment, The patient has advanced, relapsed PTLD with involvement in the mesentery, L IJ LN, pulmonary nodules, peritoneum on recent PET (7/25). Compared to prior PET on  10/29/16, the disease has shown significant progression. The patient's CBC shows pancytopenia, and his CMP shows elevated creatinine, calcium, and normal LFTs. His LDH is elevated to 1262. HIV and HBV negative. Relapsed PTLD is a/w a poor prognosis. Particularly in a patient with advanced age and poor PS, who is not a transplant candidate. They did make some recommendations regarding a lower intensity salvage regimen which were communicated to his primary oncologist in [place] where he continues to receive chemotherapy.    He was seen by his local urologist a couple of weeks ago and treated for spermatic cord inflammation with an injection and oral ibuprofen. He was taking the ibuprofen at a dose of 200 mg BID for 10 days. With that treatment the symptoms have resolved.    He also complains today of some right ear soreness and congestion, and had a small amount of blood drain from the ear recently. No fever. He also has the sensation of a foreign body (like an eyelash) in his right eye, has been watering. No purulent drainage or change in vision.     He reports a good appetite, though he continues to lose weight. He reports occasional loose stools associated with his chemotherapy.    Review of Systems    All other systems are reviewed and are negative.    Medications    Current Outpatient Prescriptions   Medication Sig Dispense Refill   ??? acetaminophen (TYLENOL) 325 MG tablet Take by mouth every six (6) hours as needed for pain.     ??? albuterol (PROAIR HFA) 90 mcg/actuation inhaler Inhale 2 puffs every four (4) hours as needed. 1 Inhaler 11   ??? allopurinol (ZYLOPRIM) 100 MG tablet TAKE 1 TABLET (100 MG TOTAL) BY MOUTH DAILY. 90 tablet 3   ??? carboxymethylcellulose sodium (THERATEARS) 0.25 % Drop Administer 2 drops to both eyes 4 (four) times a day as needed (Dry eyes). 30 mL 2   ??? colchicine 0.6 mg cap capsule Take 0.6 mg by mouth daily.     ??? finasteride (PROSCAR) 5 mg tablet TAKE 1 TABLET (5 MG TOTAL) BY MOUTH DAILY. 90 tablet 3   ??? furosemide (LASIX) 20 MG tablet 1-2 tablets daily as needed 60 tablet 11   ??? hydroxypropyl methylcellulose (ISOPTO TEARS) 2.5 % ophthalmic solution 1 drop as needed.     ??? metoprolol tartrate (LOPRESSOR) 25 MG tablet Take 0.5 tablets (12.5 mg total) by mouth Two (2) times a day. 30 tablet 11   ??? multivitamin (MULTIVITAMIN) per tablet Take 1 tablet by mouth daily.      ??? oxybutynin (DITROPAN-XL) 5 MG 24 hr tablet TAKE 1 TABLET (5 MG TOTAL) BY MOUTH ONCE DAILY. 90 tablet 3   ??? pantoprazole (PROTONIX) 40 MG tablet TAKE 1 TABLET BY MOUTH TWICE A DAY 60 tablet 1   ??? predniSONE (DELTASONE) 5 MG tablet Take 1 tablet (5 mg total) by mouth daily. 90 tablet 4   ??? sucralfate (CARAFATE) 1 gram tablet Take 1 g by mouth Four (4) times a day.     ??? tacrolimus (PROGRAF) 1 MG capsule Take 3 capsules (3mg ) in the morning and 3 capsules (3mg ) at night. Z94.0 180 capsule 11   ??? tamsulosin (FLOMAX) 0.4 mg capsule TAKE 1 CAPSULE (0.4 MG TOTAL) BY MOUTH TWO (2) TIMES A DAY. 180 capsule 3     No current facility-administered medications for this visit.        Physical Exam    BP 120/72 (BP Site: R Arm,  BP Position: Sitting, BP Cuff Size: Small)  - Pulse 62  - Temp 36.2 ??C (97.2 ??F) (Tympanic)  - Ht 167.6 cm (5' 5.98)  - Wt 54.8 kg (120 lb 12.8 oz)  - BMI 19.51 kg/m??   General: Patient is a pleasant male in no apparent distress.  Eyes: Sclera anicteric.  ENT: Oropharynx without thrush. R ear unremarkable, L ear unremarkable. R eye visibly irritated and watery but otherwise unremarkable.  Neck: Supple without LAD/JVD/bruits.  Lungs: Clear to auscultation bilaterally, without wheezes, rales or rhonchi.  Cardiovascular: Irregularly irregular without rub.  Abdomen: Soft, tnontender. No rebound or guarding. Positive bowel sounds. No tenderness over his graft.  Extremities: Trace edema bilaterally.  Skin: Without rash.  Neurological: Grossly nonfocal.  Psychiatric: Mood and affect appropriate.    Laboratory Results    Recent Results (from the past 170 hour(s))   Phosphorus Level    Collection Time: 03/04/17 10:41 AM   Result Value Ref Range    Phosphorus 1.6 (L) 2.9 - 4.7 mg/dL   Magnesium Level    Collection Time: 03/04/17 10:41 AM   Result Value Ref Range    Magnesium 1.6 1.6 - 2.2 mg/dL   Comprehensive Metabolic Panel    Collection Time: 03/04/17 10:41 AM   Result Value Ref Range    Sodium 145 135 - 145 mmol/L    Potassium 5.6 (H) 3.5 - 5.0 mmol/L    Chloride 112 (H) 98 - 107 mmol/L    CO2 24.0 22.0 - 30.0 mmol/L    BUN 28 (H) 7 - 21 mg/dL    Creatinine 1.61 (H) 0.70 - 1.30 mg/dL    BUN/Creatinine Ratio 20     EGFR MDRD Non Af Amer 48 (L) >=60 mL/min/1.41m2    EGFR MDRD Af Amer 58 (L) >=60 mL/min/1.48m2    Anion Gap 9 9 - 15 mmol/L    Glucose 91 65 - 179 mg/dL    Calcium 09.6 (H) 8.5 - 10.2 mg/dL    Albumin 3.4 (L) 3.5 - 5.0 g/dL    Total Protein 5.9 (L) 6.5 - 8.3 g/dL    Total Bilirubin 0.6 0.0 - 1.2 mg/dL    AST 38 19 - 55 U/L    ALT 25 19 - 72 U/L    Alkaline Phosphatase 171 (H) 38 - 126 U/L   Tacrolimus Level, Trough    Collection Time: 03/04/17 10:42 AM   Result Value Ref Range    Tacrolimus, Trough 3.0 <=20.0 ng/mL   CBC w/ Differential    Collection Time: 03/04/17 10:42 AM   Result Value Ref Range    WBC 13.7 (H) 4.5 - 11.0 10*9/L    RBC 3.07 (L) 4.50 - 5.90 10*12/L    HGB 8.0 (L) 13.5 - 17.5 g/dL    HCT 04.5 (L) 40.9 - 53.0 %    MCV 84.2 80.0 - 100.0 fL    MCH 25.9 (L) 26.0 - 34.0 pg    MCHC 30.8 (L) 31.0 - 37.0 g/dL    RDW 81.1 (H) 91.4 - 15.0 %    MPV 7.1 7.0 - 10.0 fL    Platelet 71 (L) 150 - 440 10*9/L    Variable HGB Concentration Slight (A) Not Present    Neutrophil Left Shift 2+ (A) Not Present    Absolute Neutrophils 12.5 (H) 2.0 - 7.5 10*9/L    Absolute Lymphocytes 0.6 (L) 1.5 - 5.0 10*9/L    Absolute Monocytes 0.4 0.2 - 0.8 10*9/L    Absolute Eosinophils  0.1 0.0 - 0.4 10*9/L    Absolute Basophils 0.0 0.0 - 0.1 10*9/L    Large Unstained Cells 1 0 - 4 %    Microcytosis Moderate (A) Not Present    Anisocytosis Moderate (A) Not Present    Hypochromasia Marked (A) Not Present   Morphology Review    Collection Time: 03/04/17 10:42 AM   Result Value Ref Range    Smear Review Comments See Comment (A) Undefined    Ovalocytes Moderate (A) Not Present    Acanthocytes Present (A) Not Present    Poikilocytosis Marked (A) Not Present   Urinalysis    Collection Time: 03/04/17 11:14 AM   Result Value Ref Range    Color, UA Yellow     Clarity, UA Clear     Specific Gravity, UA 1.017 1.003 - 1.030    pH, UA 5.5 5.0 - 9.0    Leukocyte Esterase, UA Negative Negative    Nitrite, UA Negative Negative    Protein, UA Trace (A) Negative    Glucose, UA Negative Negative    Ketones, UA Negative Negative    Urobilinogen, UA 0.2 mg/dL 0.2 mg/dL, 1.0 mg/dL Bilirubin, UA Negative Negative    Blood, UA Negative Negative    RBC, UA <1 <3 /HPF    WBC, UA 3 (H) <2 /HPF    Squam Epithel, UA <1 0 - 5 /HPF    Bacteria, UA Rare (A) None Seen /HPF    Hyaline Casts, UA 1 0 - 1 /LPF    Mucus, UA Rare (A) None Seen /HPF   Urine Culture    Collection Time: 03/04/17 11:14 AM   Result Value Ref Range    Urine Culture, Comprehensive NO GROWTH    Protein/Creatinine Ratio, Urine    Collection Time: 03/04/17 11:14 AM   Result Value Ref Range    Creat U 162.7 Undefined mg/dL    Protein, Ur 66.4 Undefined mg/dL    Protein/Creatinine Ratio, Urine 0.210 Undefined   Cytology-Urine    Collection Time: 03/04/17 11:14 AM   Result Value Ref Range    Case Report       Non-gynecologic Cytology                          Case: [id number]                                 Authorizing Provider:  Posey Boyer Ady Heimann, MD       Collected:           03/04/2017 1114              Ordering Location:     [facility] Received:            03/04/2017 1435                                     [address] [place]                                                    Pathologist:           Sheran Lawless, MD  Specimen:    Urine, Voided decoy                                                                        Final Diagnosis       A: Urine, voided  - No malignant cells identified  - No decoy cells identified       Clinical History       Kidney replaced by transplant       Gross Description       Urine, voided decoy    25 mL of cloudy yellow unfixed fluid    Materials Prepared    Smear Slides.......0  Monolayers..........1  Cytospins.............0  Cell Blocks...........0  Core Biopsy.........0  Touch Prep..........0      EMBEDDED IMAGES      Specimen Adequacy Satisfactory for evaluation     Disclaimer       Unless otherwise specified, specimens are preserved using 10% neutral buffered formalin. For cases in which immunohistochemical and/or in-situ hybridization stains are performed, the following statement applies: Appropriate controls for each stain (positive controls with or without negative controls) have been evaluated and stain as expected. These stains have not been separately validated for use on decalcified specimens and should be interpreted with caution in that setting. Some of the reagents used for these stains may be classified as analyte specific reagents (ASR). Tests using ASRs were developed, and their performance characteristics were determined, by the Anatomic Pathology Department [facility]). They have not been cleared or approved by the [place] Food and Drug Administration (FDA). The FDA does not require these tests to go through premarket FDA review. These tests are used for clinical purposes. They should not be regarded as investigational or for research. This laboratory is certified under the Clinical Laboratory Improvement Amendments (CLIA) as qualified to perform high complexity clinical laboratory testing.         Assessment and Plan    1. Status post renal transplant. His creatinine has been at baseline recently, level today is 1.43. His Prograf level of 3 is a 14 hour trough and appropriate given cancer diagnosis.    2. Hypertension. Blood pressure is 120/72 today. Denies orthostatic symptoms. He remains on metoprolol for a fib.    3. Atrial fibrillation. His rate is controlled. Too high risk for anticoagulation. He had a Zio patch that has been read but I do not see a follow up appointment with cardiology. I will reach out to them.    4. Advanced B cell lymphoma. Receiving chemotherapy locally. Unfortunately his prognosis is poor.    5. Recent spermatic cord inflammation. Treated successfully with 10 days of low dose ibuprofen, renal function stable following that. Encouraged him to make sure he stays hydrated if he requires ibuprofen in the future, and to not take it for more than 10-14 days without letting us know.    6. Health Maintenance. Oncology will give his flu shot so they can time it with his chemotherapy.    7. Will see patient back in 4 months, or sooner if needed. Patient is getting blood work every 2 weeks.     Scribe's Attestation: Lisbeth Ply, MD obtained and performed the history, physical exam and medical decision making  elements that were entered into the chart.  Signed by [place] Ormond Foster, Scribe, on March 04, 2017 at 12:10 PM.    ----------------------------------------------------------------------------------------------------------------------  March 09, 2017 7:54 PM. Documentation assistance provided by the Scribe. I was present during the time the encounter was recorded. The information recorded by the Scribe was done at my direction and has been reviewed and validated by me.  ----------------------------------------------------------------------------------------------------------------------

## 2017-03-04 ENCOUNTER — Ambulatory Visit
Admission: RE | Admit: 2017-03-04 | Discharge: 2017-03-04 | Disposition: A | Discharge status: Home / Self Care | Payer: MEDICARE | Attending: Nephrology | Admitting: Nephrology

## 2017-03-04 ENCOUNTER — Ambulatory Visit
Admission: RE | Admit: 2017-03-04 | Discharge: 2017-03-04 | Disposition: A | Discharge status: Home / Self Care | Payer: MEDICARE

## 2017-03-04 DIAGNOSIS — D899 Disorder involving the immune mechanism, unspecified: Secondary | ICD-10-CM

## 2017-03-04 DIAGNOSIS — Z940 Kidney transplant status: Principal | ICD-10-CM

## 2017-03-04 DIAGNOSIS — Z79899 Other long term (current) drug therapy: Secondary | ICD-10-CM

## 2017-03-04 LAB — URINALYSIS
BILIRUBIN UA: NEGATIVE
BLOOD UA: NEGATIVE
GLUCOSE UA: NEGATIVE
HYALINE CASTS: 1 /LPF (ref 0–1)
KETONES UA: NEGATIVE
NITRITE UA: NEGATIVE
PH UA: 5.5 (ref 5.0–9.0)
SPECIFIC GRAVITY UA: 1.017 (ref 1.003–1.030)
SQUAMOUS EPITHELIAL: <1 /HPF (ref 0–5)
UROBILINOGEN UA: 0.2
WBC UA: 3 /HPF — ABNORMAL HIGH (ref <2)

## 2017-03-04 LAB — COMPREHENSIVE METABOLIC PANEL
ALBUMIN: 3.4 g/dL — ABNORMAL LOW (ref 3.5–5.0)
ALKALINE PHOSPHATASE: 171 U/L — ABNORMAL HIGH (ref 38–126)
ALT (SGPT): 25 U/L (ref 19–72)
ANION GAP: 9 mmol/L (ref 9–15)
AST (SGOT): 38 U/L (ref 19–55)
BILIRUBIN TOTAL: 0.6 mg/dL (ref 0.0–1.2)
BLOOD UREA NITROGEN: 28 mg/dL — ABNORMAL HIGH (ref 7–21)
BUN / CREAT RATIO: 20
CALCIUM: 10.3 mg/dL — ABNORMAL HIGH (ref 8.5–10.2)
CHLORIDE: 112 mmol/L — ABNORMAL HIGH (ref 98–107)
CO2: 24 mmol/L (ref 22.0–30.0)
CREATININE: 1.43 mg/dL — ABNORMAL HIGH (ref 0.70–1.30)
EGFR MDRD AF AMER: 58 mL/min/{1.73_m2} — ABNORMAL LOW (ref >=60)
EGFR MDRD NON AF AMER: 48 mL/min/{1.73_m2} — ABNORMAL LOW (ref >=60)
GLUCOSE RANDOM: 91 mg/dL (ref 65–179)
POTASSIUM: 5.6 mmol/L — ABNORMAL HIGH (ref 3.5–5.0)
PROTEIN TOTAL: 5.9 g/dL — ABNORMAL LOW (ref 6.5–8.3)
SODIUM: 145 mmol/L (ref 135–145)

## 2017-03-04 LAB — PROTEIN / CREATININE RATIO, URINE
CREATININE, URINE: 162.7 mg/dL
PROTEIN URINE: 34.2 mg/dL

## 2017-03-04 LAB — CBC W/ AUTO DIFF
BASOPHILS ABSOLUTE COUNT: 0 10*9/L (ref 0.0–0.1)
EOSINOPHILS ABSOLUTE COUNT: 0.1 10*9/L (ref 0.0–0.4)
HEMATOCRIT: 25.8 % — ABNORMAL LOW (ref 41.0–53.0)
HEMOGLOBIN: 8 g/dL — ABNORMAL LOW (ref 13.5–17.5)
LARGE UNSTAINED CELLS: 1 % (ref 0–4)
LYMPHOCYTES ABSOLUTE COUNT: 0.6 10*9/L — ABNORMAL LOW (ref 1.5–5.0)
MEAN CORPUSCULAR HEMOGLOBIN: 25.9 pg — ABNORMAL LOW (ref 26.0–34.0)
MEAN CORPUSCULAR VOLUME: 84.2 fL (ref 80.0–100.0)
MEAN PLATELET VOLUME: 7.1 fL (ref 7.0–10.0)
MONOCYTES ABSOLUTE COUNT: 0.4 10*9/L (ref 0.2–0.8)
NEUTROPHILS ABSOLUTE COUNT: 12.5 10*9/L — ABNORMAL HIGH (ref 2.0–7.5)
PLATELET COUNT: 71 10*9/L — ABNORMAL LOW (ref 150–440)
RED BLOOD CELL COUNT: 3.07 10*12/L — ABNORMAL LOW (ref 4.50–5.90)
WBC ADJUSTED: 13.7 10*9/L — ABNORMAL HIGH (ref 4.5–11.0)

## 2017-03-04 LAB — MAGNESIUM: Magnesium:MCnc:Pt:Ser/Plas:Qn:: 1.6

## 2017-03-04 LAB — OVALOCYTES

## 2017-03-04 LAB — SLIDE REVIEW

## 2017-03-04 LAB — HYPOCHROMIA

## 2017-03-04 LAB — PROTEIN UA

## 2017-03-04 LAB — BILIRUBIN TOTAL: Bilirubin:MCnc:Pt:Ser/Plas:Qn:: 0.6

## 2017-03-04 LAB — PROTEIN URINE: Protein:MCnc:Pt:Urine:Qn:: 34.2

## 2017-03-04 LAB — PHOSPHORUS: Phosphate:MCnc:Pt:Ser/Plas:Qn:: 1.6 — ABNORMAL LOW

## 2017-03-04 LAB — TACROLIMUS, TROUGH: Lab: 3

## 2017-03-04 NOTE — Unmapped (Signed)
Urine specimen was collected at today's visit

## 2017-03-05 ENCOUNTER — Other Ambulatory Visit: Payer: Self-pay | Admitting: Urgent Care

## 2017-03-05 ENCOUNTER — Inpatient Hospital Stay: Payer: Medicare HMO

## 2017-03-05 ENCOUNTER — Other Ambulatory Visit: Payer: Self-pay | Admitting: Hematology and Oncology

## 2017-03-05 VITALS — BP 121/63 | HR 46 | Temp 97.3°F | Resp 18

## 2017-03-05 DIAGNOSIS — Z5112 Encounter for antineoplastic immunotherapy: Secondary | ICD-10-CM | POA: Diagnosis not present

## 2017-03-05 DIAGNOSIS — D649 Anemia, unspecified: Secondary | ICD-10-CM

## 2017-03-05 DIAGNOSIS — C8330 Diffuse large B-cell lymphoma, unspecified site: Secondary | ICD-10-CM

## 2017-03-05 DIAGNOSIS — R1031 Right lower quadrant pain: Secondary | ICD-10-CM

## 2017-03-05 LAB — CMV DNA, QUANTITATIVE, PCR

## 2017-03-05 LAB — CMV QUANT LOG10: Lab: 0

## 2017-03-05 LAB — SAMPLE TO BLOOD BANK

## 2017-03-05 LAB — CBC WITH DIFFERENTIAL/PLATELET
Basophils Absolute: 0 10*3/uL (ref 0–0.1)
Basophils Relative: 0 %
Eosinophils Absolute: 0.1 10*3/uL (ref 0–0.7)
Eosinophils Relative: 1 %
HCT: 21.3 % — ABNORMAL LOW (ref 40.0–52.0)
Hemoglobin: 7.2 g/dL — ABNORMAL LOW (ref 13.0–18.0)
Lymphocytes Relative: 4 %
Lymphs Abs: 0.5 10*3/uL — ABNORMAL LOW (ref 1.0–3.6)
MCH: 27 pg (ref 26.0–34.0)
MCHC: 34 g/dL (ref 32.0–36.0)
MCV: 79.2 fL — ABNORMAL LOW (ref 80.0–100.0)
Monocytes Absolute: 0.8 10*3/uL (ref 0.2–1.0)
Monocytes Relative: 6 %
Neutro Abs: 11.8 10*3/uL — ABNORMAL HIGH (ref 1.4–6.5)
Neutrophils Relative %: 89 %
Platelets: 51 10*3/uL — ABNORMAL LOW (ref 150–440)
RBC: 2.69 MIL/uL — ABNORMAL LOW (ref 4.40–5.90)
RDW: 20.6 % — ABNORMAL HIGH (ref 11.5–14.5)
WBC: 13.3 10*3/uL — ABNORMAL HIGH (ref 3.8–10.6)

## 2017-03-05 LAB — PREPARE RBC (CROSSMATCH)

## 2017-03-05 MED ORDER — ACETAMINOPHEN 325 MG PO TABS
650.0000 mg | ORAL_TABLET | Freq: Once | ORAL | Status: AC
  Administered 2017-03-05: 650 mg via ORAL
  Filled 2017-03-05: qty 2

## 2017-03-05 MED ORDER — DIPHENHYDRAMINE HCL 25 MG PO CAPS
25.0000 mg | ORAL_CAPSULE | Freq: Once | ORAL | Status: AC
  Administered 2017-03-05: 25 mg via ORAL
  Filled 2017-03-05: qty 1

## 2017-03-05 MED ORDER — HEPARIN SOD (PORK) LOCK FLUSH 100 UNIT/ML IV SOLN
500.0000 [IU] | Freq: Every day | INTRAVENOUS | Status: AC | PRN
  Administered 2017-03-05: 500 [IU]
  Filled 2017-03-05: qty 5

## 2017-03-05 MED ORDER — IBUPROFEN 200 MG PO TABS
200.0000 mg | ORAL_TABLET | Freq: Once | ORAL | Status: AC
  Administered 2017-03-05: 200 mg via ORAL
  Filled 2017-03-05: qty 1

## 2017-03-05 MED ORDER — SODIUM CHLORIDE 0.9% FLUSH
10.0000 mL | INTRAVENOUS | Status: AC | PRN
  Administered 2017-03-05: 10 mL
  Filled 2017-03-05: qty 10

## 2017-03-05 MED ORDER — SODIUM CHLORIDE 0.9 % IV SOLN
250.0000 mL | Freq: Once | INTRAVENOUS | Status: AC
  Administered 2017-03-05: 250 mL via INTRAVENOUS
  Filled 2017-03-05: qty 250

## 2017-03-05 NOTE — Unmapped (Signed)
Pt is having continued scrotal pain still so his local urologist asked Dr. Carlene Coria if he can have low dose ibuprofen.  She told him 200-400mg  (1-2 tablets) a day with good hydration is ok.  I explained the same to the patient.

## 2017-03-06 LAB — BPAM RBC
Blood Product Expiration Date: 201809262359
ISSUE DATE / TIME: 201809131110
Unit Type and Rh: 5100

## 2017-03-06 LAB — TYPE AND SCREEN
ABO/RH(D): O POS
Antibody Screen: NEGATIVE
Unit division: 0

## 2017-03-08 ENCOUNTER — Emergency Department: Payer: Medicare HMO

## 2017-03-08 ENCOUNTER — Emergency Department
Admission: EM | Admit: 2017-03-08 | Discharge: 2017-03-08 | Disposition: A | Discharge status: Home / Self Care | Payer: Medicare HMO | Attending: Emergency Medicine | Admitting: Emergency Medicine

## 2017-03-08 DIAGNOSIS — N509 Disorder of male genital organs, unspecified: Secondary | ICD-10-CM | POA: Insufficient documentation

## 2017-03-08 DIAGNOSIS — E875 Hyperkalemia: Secondary | ICD-10-CM

## 2017-03-08 DIAGNOSIS — N5089 Other specified disorders of the male genital organs: Secondary | ICD-10-CM

## 2017-03-08 DIAGNOSIS — I481 Persistent atrial fibrillation: Secondary | ICD-10-CM | POA: Diagnosis not present

## 2017-03-08 DIAGNOSIS — Z87891 Personal history of nicotine dependence: Secondary | ICD-10-CM | POA: Diagnosis not present

## 2017-03-08 DIAGNOSIS — N50811 Right testicular pain: Secondary | ICD-10-CM | POA: Insufficient documentation

## 2017-03-08 DIAGNOSIS — C7800 Secondary malignant neoplasm of unspecified lung: Secondary | ICD-10-CM | POA: Diagnosis not present

## 2017-03-08 DIAGNOSIS — Z940 Kidney transplant status: Secondary | ICD-10-CM | POA: Insufficient documentation

## 2017-03-08 DIAGNOSIS — Z8571 Personal history of Hodgkin lymphoma: Secondary | ICD-10-CM | POA: Diagnosis not present

## 2017-03-08 DIAGNOSIS — I120 Hypertensive chronic kidney disease with stage 5 chronic kidney disease or end stage renal disease: Secondary | ICD-10-CM | POA: Insufficient documentation

## 2017-03-08 DIAGNOSIS — Z5112 Encounter for antineoplastic immunotherapy: Secondary | ICD-10-CM | POA: Diagnosis not present

## 2017-03-08 DIAGNOSIS — R1030 Lower abdominal pain, unspecified: Secondary | ICD-10-CM

## 2017-03-08 DIAGNOSIS — Z79899 Other long term (current) drug therapy: Secondary | ICD-10-CM | POA: Insufficient documentation

## 2017-03-08 DIAGNOSIS — N186 End stage renal disease: Secondary | ICD-10-CM | POA: Diagnosis not present

## 2017-03-08 LAB — LACTIC ACID, PLASMA: Lactic Acid, Venous: 1.6 mmol/L (ref 0.5–1.9)

## 2017-03-08 LAB — CBC WITH DIFFERENTIAL/PLATELET
Basophils Absolute: 0 10*3/uL (ref 0–0.1)
Basophils Relative: 0 %
EOS PCT: 0 %
Eosinophils Absolute: 0.1 10*3/uL (ref 0–0.7)
HCT: 28.6 % — ABNORMAL LOW (ref 40.0–52.0)
HEMOGLOBIN: 9.5 g/dL — AB (ref 13.0–18.0)
LYMPHS ABS: 0.6 10*3/uL — AB (ref 1.0–3.6)
LYMPHS PCT: 2 %
MCH: 27 pg (ref 26.0–34.0)
MCHC: 33.4 g/dL (ref 32.0–36.0)
MCV: 80.7 fL (ref 80.0–100.0)
MONOS PCT: 4 %
Monocytes Absolute: 1 10*3/uL (ref 0.2–1.0)
Neutro Abs: 24.3 10*3/uL — ABNORMAL HIGH (ref 1.4–6.5)
Neutrophils Relative %: 94 %
Platelets: 83 10*3/uL — ABNORMAL LOW (ref 150–440)
RBC: 3.54 MIL/uL — AB (ref 4.40–5.90)
RDW: 20.3 % — ABNORMAL HIGH (ref 11.5–14.5)
WBC: 26 10*3/uL — AB (ref 3.8–10.6)

## 2017-03-08 LAB — COMPREHENSIVE METABOLIC PANEL
ALT: 11 U/L — ABNORMAL LOW (ref 17–63)
AST: 37 U/L (ref 15–41)
Albumin: 3.3 g/dL — ABNORMAL LOW (ref 3.5–5.0)
Alkaline Phosphatase: 177 U/L — ABNORMAL HIGH (ref 38–126)
Anion gap: 5 (ref 5–15)
BUN: 24 mg/dL — ABNORMAL HIGH (ref 6–20)
CHLORIDE: 110 mmol/L (ref 101–111)
CO2: 23 mmol/L (ref 22–32)
Calcium: 9.9 mg/dL (ref 8.9–10.3)
Creatinine, Ser: 1.52 mg/dL — ABNORMAL HIGH (ref 0.61–1.24)
GFR, EST AFRICAN AMERICAN: 50 mL/min — AB (ref >60)
GFR, EST NON AFRICAN AMERICAN: 43 mL/min — AB (ref >60)
Glucose, Bld: 92 mg/dL (ref 65–99)
POTASSIUM: 5.5 mmol/L — AB (ref 3.5–5.1)
SODIUM: 138 mmol/L (ref 135–145)
Total Bilirubin: 1 mg/dL (ref 0.3–1.2)
Total Protein: 5.9 g/dL — ABNORMAL LOW (ref 6.5–8.1)

## 2017-03-08 MED ORDER — OXYCODONE-ACETAMINOPHEN 5-325 MG PO TABS
1.0000 | ORAL_TABLET | Freq: Once | ORAL | Status: AC
  Administered 2017-03-08: 1 via ORAL
  Filled 2017-03-08: qty 1

## 2017-03-08 MED ORDER — IOPAMIDOL (ISOVUE-300) INJECTION 61%
15.0000 mL | INTRAVENOUS | Status: AC
Start: 2017-03-08 — End: 2017-03-08
  Administered 2017-03-08 (×2): 15 mL via ORAL

## 2017-03-08 MED ORDER — OXYCODONE-ACETAMINOPHEN 5-325 MG PO TABS: 1.0000 | ORAL_TABLET | Freq: Four times a day (QID) | ORAL | 0 refills | Status: DC | PRN

## 2017-03-08 MED ORDER — PATIROMER SORBITEX CALCIUM 8.4 G PO PACK
8.4000 g | PACK | Freq: Every day | ORAL | Status: DC
  Administered 2017-03-08: 8.4 g via ORAL
  Filled 2017-03-08: qty 4

## 2017-03-08 NOTE — ED Triage Notes (Signed)
Pt came to ED via pov c/o groin swelling started yesterday. History of cancer, was given shots a few days ago and pt unsure if this is related. VS stable.

## 2017-03-08 NOTE — ED Provider Notes (Addendum)
[facility] Emergency Department Provider Note  ____________________________________________   First MD Initiated Contact with Patient 03/08/17 1324     (approximate)  I have reviewed the triage vital signs and the nursing notes.   HISTORY  Chief Complaint Groin Swelling   HPI Matthew Brown is a 76 y.o. male status post kidney transplant as well as with metastatic lymphoma who is presenting to the emergency Department today with swelling and pain to the right testicle as well as the right groin. He says that this pain has been worsening over the past 1.5 weeks. He says that he has been seen by urology who diagnosed him with a "inflamed spermatic cord." He says that he has been taking anti-inflammatories including ibuprofen as well as a course of antibiotics with worsening of his symptoms. He denies any nausea or vomiting. Denies any abdominal pain. Denies any difficulty with stooling or constipation. He says his pain at this time is sharp and "12 out of 10."   Past Medical History:  Diagnosis Date  . Benign prostatic hypertrophy   . Chronic headache 10/19/2015  . ED (erectile dysfunction)   . End stage renal disease ([place])   . Essential hypertension   . GERD (gastroesophageal reflux disease)   . GIB (gastrointestinal bleeding)    a. 01/2992 s/p R colic artery embolization;  b. 02/2015 EGD: duod ulcerative mass->Bx notable for coagulative necrosis - ? ischemia vs thrombosis-->coumadin d/c'd.  . Gout   . Hearing loss   . Hemorrhoids   . Hyperlipidemia   . Lymphoma ([place])   . Lymphoma ([place]) 2017  . Multiple thyroid nodules 06/06/2016   Noted on carotid US; dedicated US to be ordered by staff  . Osteoarthrosis, unspecified whether generalized or localized, lower leg   . Persistent atrial fibrillation ([place])    a. CHA2DS2VASc = 3-->coumadin d/c'd 02/2015 2/2 recurrent GIB.  [name] Prostatitis   . Pulmonary hypertension ([place])    a. 10/2014 Echo: EF 60-65%, mild to  mod MR, mildly dil LA, nl RV, PASP 44mmHg.  [name] Renal transplant recipient   . Ulcers of both great toes [facility])     Patient Active Problem List   Diagnosis Date Noted  . Spermatic cord inflammation 02/24/2017  . Thrombocytopenia ([place]) 02/14/2017  . Chemotherapy-induced neutropenia ([place]) 02/14/2017  . Malignant neoplasm metastatic to lung ([place]) 02/03/2017  . 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 Diffuse large B-cell lymphoma of extranodal site excluding spleen and other solid organs ([place]) 01/30/2017  . Encounter for antineoplastic immunotherapy 09/03/2016  . Encounter for anticoagulation discussion and counseling 08/31/2016  . Acute upper GI bleed   . Cancer ([place])   . Palliative care by specialist   . DNR (do not resuscitate) discussion   . Goals of care, counseling/discussion   . GIB (gastrointestinal bleeding) 07/08/2016  . Encounter for antineoplastic chemotherapy 07/07/2016  . Gastroenteritis 06/25/2016  . Multiple thyroid nodules 06/06/2016  . Liver metastases ([place]) 05/26/2016  . Bone metastasis ([place]) 05/26/2016  . Non-intractable vomiting with nausea   . Diarrhea   . Neutropenic fever ([place]) 05/18/2016  . Carotid atherosclerosis, bilateral 05/09/2016  . Protein-calorie malnutrition, severe 05/08/2016  . Hypercalcemia 05/06/2016  . Renal transplant recipient 05/06/2016  . 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 Tumor lysis syndrome 05/06/2016  . High serum parathyroid hormone (PTH) 05/03/2016  . Pain in joint involving pelvic region and thigh 05/03/2016  . Diffuse large B cell lymphoma ([place]) 04/30/2016  . Abnormal positron emission  tomography (PET) scan   . Anemia 04/29/2016  . Lymphoma ([place]) 04/28/2016  . Pneumothorax 04/28/2016  . Hyponatremia 04/28/2016  . Adenopathy 04/15/2016  . Bone disease 04/15/2016  . Weight loss 04/15/2016  . Spinal stenosis of cervical region 11/21/2015  . Pleural effusion, right 11/21/2015  . Right inguinal pain  10/19/2015  . Chronic headache 10/19/2015  . Hip strain 10/09/2015  . BPH with obstruction/lower urinary tract symptoms 09/03/2015  . Prostatitis, chronic 09/03/2015  . Erectile dysfunction of organic origin 09/03/2015  . Difficulty urinating 08/14/2015  . Accumulation of fluid in tissues 07/18/2015  . Persistent atrial fibrillation ([place])   . Pain in finger of right hand 06/15/2015  . Eustachian tube dysfunction, left 06/06/2015  . Chronic insomnia 06/06/2015  . H/O: upper GI bleed 06/06/2015  . Pleural cavity effusion 04/07/2015  . Abdominal pain, generalized 04/04/2015  . Pneumonia due to Haemophilus influenzae ([place]) 03/16/2015  . Encounter for therapeutic drug monitoring 07/27/2013  . Long term (current) use of anticoagulants 09/25/2010  . Hyperlipidemia 10/25/2008  . Essential hypertension 10/25/2008  . ATRIAL FIBRILLATION 10/25/2008  . GERD 10/25/2008  . RENAL FAILURE, END STAGE 10/25/2008  . Osteoarthrosis, unspecified whether generalized or localized, involving lower leg 10/25/2008  . BENIGN PROSTATIC HYPERTROPHY, HX OF 10/25/2008    Past Surgical History:  Procedure Laterality Date  . AV FISTULA PLACEMENT  1998  . BACK SURGERY    . ESOPHAGOGASTRODUODENOSCOPY  03/13/15   severe esophagitis, ulcerated mass  . ESOPHAGOGASTRODUODENOSCOPY (EGD) WITH PROPOFOL N/A 07/09/2016   Procedure: ESOPHAGOGASTRODUODENOSCOPY (EGD) WITH PROPOFOL;  Surgeon: [name], MD;  Location: [facility] ENDOSCOPY;  Service: Endoscopy;  Laterality: N/A;  . HERNIA REPAIR  1974  . KIDNEY TRANSPLANT  2006  . PERIPHERAL VASCULAR CATHETERIZATION N/A 05/07/2016   Procedure: Glori Luis Cath Insertion;  Surgeon: Algernon Huxley, MD;  Location: [facility];  Service: Cardiovascular;  Laterality: N/A;  . PROSTATE ABLATION    . STOMACH SURGERY     blood vessel burst  . THROAT SURGERY    . TOTAL KNEE ARTHROPLASTY      Prior to Admission medications   Medication Sig Start Date End Date Taking? Authorizing  Provider  acetaminophen (TYLENOL) 325 MG tablet Take 2 tablets (650 mg total) by mouth every 6 (six) hours as needed for mild pain (or Fever >/= 101). 05/13/16   Gouru, Illene Silver, MD  albuterol (PROAIR HFA) 108 (90 BASE) MCG/ACT inhaler Inhale 1-2 puffs into the lungs every 4 (four) hours as needed.  02/20/14   [provider]  allopurinol (ZYLOPRIM) 100 MG tablet Take 100 mg by mouth daily.      [provider]  cephALEXin (KEFLEX) 500 MG capsule Take 1 capsule (500 mg total) by mouth 2 (two) times daily. 02/19/17   Zara Council A, PA-C  COLCRYS 0.6 MG tablet Take 1 tablet by mouth 2 (two) times daily as needed. 05/03/16   [provider]  diphenhydrAMINE (BENADRYL) 25 mg capsule Take 1 capsule (25 mg total) by mouth at bedtime as needed for sleep. 05/13/16   Gouru, Illene Silver, MD  feeding supplement, ENSURE ENLIVE, (ENSURE ENLIVE) LIQD Take 237 mLs by mouth 3 (three) times daily between meals. 05/13/16   Nicholes Mango, MD  finasteride (PROSCAR) 5 MG tablet Take 1 tablet (5 mg total) by mouth daily. 09/02/16   Zara Council A, PA-C  furosemide (LASIX) 20 MG tablet Take 20 mg by mouth every other day. Take 1-2 tablets daily. 07/22/16 07/22/17  [provider]  hydroxypropyl methylcellulose (ISOPTO TEARS) 2.5 % ophthalmic solution Place 1 drop into both eyes as needed.     [provider]  metoprolol tartrate (LOPRESSOR) 25 MG tablet Take 12.5 mg by mouth 2 (two) times daily.  11/04/16 11/04/17  [provider]  Multiple Vitamin (MULTIVITAMIN) tablet Take 1 tablet by mouth daily.      [provider]  ondansetron (ZOFRAN) 4 MG tablet Take 1 tablet (4 mg total) by mouth every 6 (six) hours as needed for nausea. 05/22/16   [place] Basta, MD  oxybutynin (DITROPAN-XL) 5 MG 24 hr tablet Take 1 tablet (5 mg total) by mouth daily. 09/02/16   McGowan, Hunt Oris, PA-C  oxyCODONE-acetaminophen (ROXICET) 5-325 MG tablet Take 1 tablet by mouth every 6  (six) hours as needed for severe pain. Patient not taking: Reported on 02/27/2017 01/29/17   Lequita Asal, MD  pantoprazole (PROTONIX) 40 MG tablet TAKE 1 TABLET BY MOUTH TWICE A DAY 01/07/17   Nolon Stalls C, MD  pantoprazole (PROTONIX) 40 MG tablet TAKE 1 TABLET BY MOUTH TWICE A DAY 02/03/17   Lequita Asal, MD  predniSONE (DELTASONE) 20 MG tablet as directed.  07/27/16   [provider]  predniSONE (DELTASONE) 5 MG tablet Take 5 mg by mouth daily.  11/23/13   [provider]  simethicone (MYLICON) 80 MG chewable tablet Chew 1 tablet (80 mg total) by mouth every 6 (six) hours as needed for flatulence. 06/28/16   Loletha Grayer, MD  sucralfate (CARAFATE) 1 g tablet Take 1 tablet (1 g total) by mouth 4 (four) times daily. Resume taking after one week- once finished taking oral levaquine. ( to avoid interaction.) 05/22/16   [place] Basta, MD  tacrolimus (PROGRAF) 1 MG capsule Take 3 mg by mouth 2 (two) times daily. Reported on 08/16/2015    [provider]  tamsulosin (FLOMAX) 0.4 MG CAPS capsule Take 1 capsule (0.4 mg total) by mouth daily. 09/02/16   Zara Council A, PA-C  traZODone (DESYREL) 50 MG tablet 1-2 TABS AS NEEDED FOR SLEEP 07/22/16   [provider]    Allergies Patient has no known allergies.  Family History  Problem Relation Age of Onset  . Cancer Mother        throat  . Diabetes Brother   . Heart disease Brother   . Stroke Brother   . Hypertension Brother   . Diabetes Sister   . Heart disease Sister   . Hypertension Sister   . Diabetes Sister   . Diabetes Brother   . COPD Neg Hx   . Kidney disease Neg Hx   . Prostate cancer Neg Hx   . Kidney cancer Neg Hx   . Bladder Cancer Neg Hx     Social History Social History  Substance Use Topics  . Smoking status: Former Smoker    Packs/day: 1.00    Years: 25.00    Types: Cigarettes    Quit date: 06/23/1978  . Smokeless tobacco: Never Used  . Alcohol use No     Review of Systems  Constitutional: No fever/chills Eyes: No visual changes. ENT: No sore throat. Cardiovascular: Denies chest pain. Respiratory: Denies shortness of breath. Gastrointestinal: No abdominal pain.  No nausea, no vomiting.  No diarrhea.  No constipation. Genitourinary: Negative for dysuria. Musculoskeletal: Negative for back pain. Skin: Negative for rash. Neurological: Negative for headaches, focal weakness or numbness.   ____________________________________________   PHYSICAL EXAM:  VITAL SIGNS: ED Triage Vitals  Enc Vitals Group  BP 03/08/17 1234 136/65     Pulse Rate 03/08/17 1234 64     Resp 03/08/17 1234 16     Temp 03/08/17 1234 98.1 F (36.7 C)     Temp Source 03/08/17 1234 Oral     SpO2 03/08/17 1234 98 %     Weight 03/08/17 1236 121 lb (54.9 kg)     Height 03/08/17 1236 5\' 6"  (1.676 m)     Head Circumference --      Peak Flow --      Pain Score 03/08/17 1339 10     Pain Loc --      Pain Edu? --      Excl. in [place]? --     Constitutional: Alert and oriented. Well appearing and in no acute distress. Eyes: Conjunctivae are normal.  Head: Atraumatic. Nose: No congestion/rhinnorhea. Mouth/Throat: Mucous membranes are moist.  Neck: No stridor.   Cardiovascular: Normal rate, regular rhythm. Grossly normal heart sounds.  Respiratory: Normal respiratory effort.  No retractions. Lungs CTAB. Gastrointestinal: Soft and nontender. No distention. No CVA tenderness. Genitourinary:  normal gross examination in this uncircumcised male. The foreskin is easily retractable and there is no buildup or tenderness to the penis. There is tenderness palpation to the right hemiscrotum with a sausagelike mass palpable inside hemiscrotum which is rigid and tender to palpation and about 1.5 cm in circumference. He is also tender to the right groin region with fullness and tenderness palpation. Masses are not mobile are reducible. Musculoskeletal: No lower extremity  tenderness nor edema.  No joint effusions. Neurologic:  Normal speech and language. No gross focal neurologic deficits are appreciated. Skin:  Skin is warm, dry and intact. No rash noted. Psychiatric: Mood and affect are normal. Speech and behavior are normal.  ____________________________________________   LABS (all labs ordered are listed, but only abnormal results are displayed)  Labs Reviewed  COMPREHENSIVE METABOLIC PANEL - Abnormal; Notable for the following:       Result Value   Potassium 5.5 (*)    BUN 24 (*)    Creatinine, Ser 1.52 (*)    Total Protein 5.9 (*)    Albumin 3.3 (*)    ALT 11 (*)    Alkaline Phosphatase 177 (*)    GFR calc non Af Amer 43 (*)    GFR calc Af Amer 50 (*)    All other components within normal limits  CBC WITH DIFFERENTIAL/PLATELET - Abnormal; Notable for the following:    WBC 26.0 (*)    RBC 3.54 (*)    Hemoglobin 9.5 (*)    HCT 28.6 (*)    RDW 20.3 (*)    Platelets 83 (*)    Neutro Abs 24.3 (*)    Lymphs Abs 0.6 (*)    All other components within normal limits  LACTIC ACID, PLASMA  LACTIC ACID, PLASMA   ____________________________________________  EKG   ____________________________________________  RADIOLOGY  ultrasound soft tissue mass in the scrotum which appears separate from the epididymis and testicle.  No acute findings in the scrotum on the CT scan. However, lymph nodes have increased in size since the last study. ____________________________________________   PROCEDURES  Procedure(s) performed:   Procedures  Critical Care performed:   ____________________________________________   INITIAL IMPRESSION / ASSESSMENT AND PLAN / ED COURSE  Pertinent labs & imaging results that were available during my care of the patient were reviewed by me and considered in my medical decision making (see chart for details).  soft tissue mass  of unknown origin to the right hemiscrotum. We will repeat the patient's CT with by  mouth contrast. Possible hernia.Discussed with surgery , Dr. Dahlia Byes, who recommends pursuing the CT scan. He says that because of the patient's, congenital medical history. He unlikely to take the patient to surgery at this hospital.    ----------------------------------------- 5:28 PM on 03/08/2017 -----------------------------------------  I discussed the case with Dr. Junious Silk of urology who is suspicious of an enlarged lymph node related to the patient's cancer causing the right hemiscrotal issues. There are no findings consistent with abscess. There is no induration on the scrotum nor redness or heat. I also discussed the case with Dr. Burlene Arnt of the cancer center who says that he'll relay the message Dr. Mike Gip that the patient was in the emergency department. I discussed the likely finding of lymph node enlargement in the scrotum. This is consistent with the exam very hardened, nonmobile mass. Furthermore, patient's white count is 26 which is elevated from his baseline but within the range of the record that ranges from about 11-40.  Family aware as well as the elevated potassium. Patient will be receiving a dose of Veltassa prior to DC.  advised the patient as well as family to follow up within the next 1-2 days with Dr. Mike Gip for reevaluation as well as rechecking of his potassium. The patient is understanding of the plan and the family understands plan and willing to comply.  ____________________________________________   FINAL CLINICAL IMPRESSION(S) / ED DIAGNOSES  Final diagnoses:  Groin pain  scrotal mass.    NEW MEDICATIONS STARTED DURING THIS VISIT:  New Prescriptions   No medications on file     Note:  This document was prepared using Dragon voice recognition software and may include unintentional dictation errors.     Orbie Pyo, MD 03/08/17 1732  patient in no acute distress at this time of my reevaluation. Says that the pain was improved with  Percocet. Will be sent home with a prescription for Percocet.    Orbie Pyo, MD 03/08/17 1743  patient denies dysuria. Says that he has had multiple negative urinalyses since this issue began. The injections that he was concerned about causing further pain or lidocaine injections. However, there was pain present before the injections occurred.   Orbie Pyo, MD 03/08/17 [phone]

## 2017-03-08 NOTE — Unmapped (Signed)
Paged to oncall pager by patient for concern of scrotal pain and swelling.    He has history of renal transplant (2006, see Dr.True's recent note) and now with stage IV diffuse large Bcell lymphoma getting chemotherapy. He has recently developed painful spermatic cord inflammation, seen by Dr.Brandon (Urology) and underwent lidocaine injection cord block on 02/20/17 with resolution of his symptoms. However pain has recurred over the last week, now with additional scrotal swelling. He denies any erythema or warmth around swelling. No difficulty voiding urine, no fevers/chills. He was instructed to take ibuprofen occasionally 200-400mg  for pain, however he has not taken it last two days due to concern for kidney injury.    I spoke to him and his wife, instructed them to contact the Urologist on call at Dr.Brandon's office and they should go to the Emergency room for further evaluation. I am concerned the swelling is new and worsening, possible infection in this immunosuppressed patient, ?hematoma or recurrent cord inflammation. Instructed him that it is okay to take 400mg  ibuprofen for pain/inflammation while waiting to hear back, take with fluids for hydration.     They are to call me back if unable to get in touch with urology, I asked them to go to either nearest emergency room or come to [facility].

## 2017-03-10 ENCOUNTER — Telehealth: Payer: Self-pay | Admitting: *Deleted

## 2017-03-10 ENCOUNTER — Encounter: Payer: Self-pay | Admitting: Hematology and Oncology

## 2017-03-10 ENCOUNTER — Other Ambulatory Visit: Payer: Self-pay | Admitting: *Deleted

## 2017-03-10 ENCOUNTER — Inpatient Hospital Stay: Payer: Medicare HMO

## 2017-03-10 ENCOUNTER — Inpatient Hospital Stay (HOSPITAL_BASED_OUTPATIENT_CLINIC_OR_DEPARTMENT_OTHER): Payer: Medicare HMO | Admitting: Hematology and Oncology

## 2017-03-10 VITALS — BP 145/54 | HR 54 | Temp 97.0°F | Ht 66.0 in | Wt 121.0 lb

## 2017-03-10 DIAGNOSIS — Z940 Kidney transplant status: Secondary | ICD-10-CM

## 2017-03-10 DIAGNOSIS — M1990 Unspecified osteoarthritis, unspecified site: Secondary | ICD-10-CM

## 2017-03-10 DIAGNOSIS — C7800 Secondary malignant neoplasm of unspecified lung: Secondary | ICD-10-CM

## 2017-03-10 DIAGNOSIS — M109 Gout, unspecified: Secondary | ICD-10-CM

## 2017-03-10 DIAGNOSIS — R944 Abnormal results of kidney function studies: Secondary | ICD-10-CM | POA: Diagnosis not present

## 2017-03-10 DIAGNOSIS — I129 Hypertensive chronic kidney disease with stage 1 through stage 4 chronic kidney disease, or unspecified chronic kidney disease: Secondary | ICD-10-CM | POA: Diagnosis not present

## 2017-03-10 DIAGNOSIS — N5089 Other specified disorders of the male genital organs: Secondary | ICD-10-CM | POA: Diagnosis not present

## 2017-03-10 DIAGNOSIS — E785 Hyperlipidemia, unspecified: Secondary | ICD-10-CM

## 2017-03-10 DIAGNOSIS — I4891 Unspecified atrial fibrillation: Secondary | ICD-10-CM

## 2017-03-10 DIAGNOSIS — C7951 Secondary malignant neoplasm of bone: Secondary | ICD-10-CM

## 2017-03-10 DIAGNOSIS — Z87891 Personal history of nicotine dependence: Secondary | ICD-10-CM

## 2017-03-10 DIAGNOSIS — C8330 Diffuse large B-cell lymphoma, unspecified site: Secondary | ICD-10-CM

## 2017-03-10 DIAGNOSIS — C8338 Diffuse large B-cell lymphoma, lymph nodes of multiple sites: Secondary | ICD-10-CM

## 2017-03-10 DIAGNOSIS — R1031 Right lower quadrant pain: Secondary | ICD-10-CM | POA: Diagnosis not present

## 2017-03-10 DIAGNOSIS — N186 End stage renal disease: Secondary | ICD-10-CM | POA: Diagnosis not present

## 2017-03-10 DIAGNOSIS — E210 Primary hyperparathyroidism: Secondary | ICD-10-CM | POA: Diagnosis not present

## 2017-03-10 DIAGNOSIS — I272 Pulmonary hypertension, unspecified: Secondary | ICD-10-CM

## 2017-03-10 DIAGNOSIS — Z5112 Encounter for antineoplastic immunotherapy: Secondary | ICD-10-CM | POA: Diagnosis not present

## 2017-03-10 DIAGNOSIS — D47Z1 Post-transplant lymphoproliferative disorder (PTLD): Secondary | ICD-10-CM

## 2017-03-10 DIAGNOSIS — C7889 Secondary malignant neoplasm of other digestive organs: Secondary | ICD-10-CM

## 2017-03-10 DIAGNOSIS — Z800 Family history of malignant neoplasm of digestive organs: Secondary | ICD-10-CM

## 2017-03-10 DIAGNOSIS — K219 Gastro-esophageal reflux disease without esophagitis: Secondary | ICD-10-CM | POA: Diagnosis not present

## 2017-03-10 DIAGNOSIS — N400 Enlarged prostate without lower urinary tract symptoms: Secondary | ICD-10-CM | POA: Diagnosis not present

## 2017-03-10 DIAGNOSIS — C787 Secondary malignant neoplasm of liver and intrahepatic bile duct: Secondary | ICD-10-CM

## 2017-03-10 DIAGNOSIS — Z79899 Other long term (current) drug therapy: Secondary | ICD-10-CM

## 2017-03-10 DIAGNOSIS — E860 Dehydration: Secondary | ICD-10-CM

## 2017-03-10 DIAGNOSIS — D649 Anemia, unspecified: Secondary | ICD-10-CM | POA: Diagnosis not present

## 2017-03-10 DIAGNOSIS — T8699 Other complications of unspecified transplanted organ and tissue: Secondary | ICD-10-CM

## 2017-03-10 DIAGNOSIS — Z7189 Other specified counseling: Secondary | ICD-10-CM

## 2017-03-10 LAB — BASIC METABOLIC PANEL
Anion gap: 12 (ref 5–15)
BUN: 30 mg/dL — ABNORMAL HIGH (ref 6–20)
CO2: 17 mmol/L — ABNORMAL LOW (ref 22–32)
Calcium: 9.9 mg/dL (ref 8.9–10.3)
Chloride: 102 mmol/L (ref 101–111)
Creatinine, Ser: 1.8 mg/dL — ABNORMAL HIGH (ref 0.61–1.24)
GFR calc Af Amer: 40 mL/min — ABNORMAL LOW (ref >60)
GFR calc non Af Amer: 35 mL/min — ABNORMAL LOW (ref >60)
Glucose, Bld: 64 mg/dL — ABNORMAL LOW (ref 65–99)
Potassium: 5.1 mmol/L (ref 3.5–5.1)
Sodium: 131 mmol/L — ABNORMAL LOW (ref 135–145)

## 2017-03-10 LAB — CBC WITH DIFFERENTIAL/PLATELET
Basophils Absolute: 0 10*3/uL (ref 0–0.1)
Basophils Relative: 0 %
Eosinophils Absolute: 0.1 10*3/uL (ref 0–0.7)
Eosinophils Relative: 1 %
HCT: 27.2 % — ABNORMAL LOW (ref 40.0–52.0)
Hemoglobin: 9.3 g/dL — ABNORMAL LOW (ref 13.0–18.0)
Lymphocytes Relative: 2 %
Lymphs Abs: 0.6 10*3/uL — ABNORMAL LOW (ref 1.0–3.6)
MCH: 27.8 pg (ref 26.0–34.0)
MCHC: 34.4 g/dL (ref 32.0–36.0)
MCV: 80.9 fL (ref 80.0–100.0)
Monocytes Absolute: 1.2 10*3/uL — ABNORMAL HIGH (ref 0.2–1.0)
Monocytes Relative: 5 %
Neutro Abs: 24.1 10*3/uL — ABNORMAL HIGH (ref 1.4–6.5)
Neutrophils Relative %: 92 %
Platelets: 129 10*3/uL — ABNORMAL LOW (ref 150–440)
RBC: 3.36 MIL/uL — ABNORMAL LOW (ref 4.40–5.90)
RDW: 21 % — ABNORMAL HIGH (ref 11.5–14.5)
WBC: 26 10*3/uL — ABNORMAL HIGH (ref 3.8–10.6)

## 2017-03-10 LAB — URIC ACID: Uric Acid, Serum: 8.5 mg/dL — ABNORMAL HIGH (ref 4.4–7.6)

## 2017-03-10 LAB — LACTATE DEHYDROGENASE: LDH: 501 U/L — ABNORMAL HIGH (ref 98–192)

## 2017-03-10 MED ORDER — HEPARIN SOD (PORK) LOCK FLUSH 100 UNIT/ML IV SOLN
500.0000 [IU] | Freq: Once | INTRAVENOUS | Status: AC
  Administered 2017-03-10: 500 [IU] via INTRAVENOUS

## 2017-03-10 MED ORDER — LENALIDOMIDE 15 MG PO CAPS: 15.0000 mg | ORAL_CAPSULE | ORAL | 0 refills | Status: AC

## 2017-03-10 MED ORDER — SODIUM CHLORIDE 0.9 % IV SOLN
Freq: Once | INTRAVENOUS | Status: AC
  Administered 2017-03-10: 12:00:00 via INTRAVENOUS
  Filled 2017-03-10: qty 1000

## 2017-03-10 NOTE — Progress Notes (Signed)
[facility] day:  03/10/2017   Chief Complaint: Matthew Brown is a 76 y.o. male with post-transplant lymphoproliferative disorder, stage IVBE diffuse large B cell lymphoma, who is seen for assessment prior to cycle #3 gemcitabine and oxaliplatin + obinutuzumab.  HPI:  The patient was last seen in the medical oncology symptom management clinic on 02/27/2017.  At that time, he felt "great".  He debied denies pain.  He had no physical complaints.  He was eating better and gaining weight.  He denied any fevers or sweats.  Exam revealsed resolved right inguinal pain on palpation. Hemoglobin was 8.1 and hematocrit 24.6.  WBC was 41,600 with an [place] s/p Neulasta.  Creatinine was 1.47.  He saw Dr. Herma Ard, nephrologist at [facility], on 03/04/2017.  He was told that he could take 200 - 400 mg a day of ibuprofen with good hydration.  He was seen in the ER on on 03/08/2017 with right groin and testicle swelling.  Exam revealed a sausage like mass palpable inside the right hemiscrotum which was rigid and tender to palpation.  Mass was not mobile or reducible.  Scrotal ultrasound revealed soft tissue structure in the right scrotum separate from the epididymis and testicle. There was internal blood flow. The appearance could represent bowel herniated into the right side of the scrotum but there  was no peristalsis and there was no definitive hernia seen on a recent CT scan.  Abdomen CT on 03/08/2017 revealed stable lingular and left lower lobe pulmonary metastatic nodules to the extent included.  There was slight interval increase in size of several hepatic metastatic lesions (4.3 cm vs 3.6 cm; 5.2 cm vs 4.9 cm).  There was equivocal overall change with reference to retroperitoneal and mesenteric lymphadenopathy, some areas appear to have increased in size such as the retrocaval lymph node mass (4.1 x 6.4 cm vs 6 x 3.1 cm), others remaining stable such as the left  para-aortic and pelvic lymph node index masses and others appearing slightly smaller in the lower abdominal mesentery.   There was some mild nonspecific scrotal skin thickening.  There was no bowel herniation or definite intratesticular mass. Tubular densities were seen within the scrotum bilaterally which were likely related to the spermatic cord and vessels.   There was no significant hydrocele.  [facility] ER labs included a hematocrit of 28.6, hemoglobin 9.5, platelets 83,000, WBC 26,000 with an ANC of 24,300.  Potassium was 5.5.  He received veltassa (potassium binder).  Symptomatically, he is feeling "ok" today. He is having the pain again in his RIGHT groin. Pain rated 8/10. He is not taking the recommended Ibuprofen. His wife allows him to take ibuprofen if he is drinking well.  He is not drinking well.  He used oxycodone 39m on Sunday twice; slept "a lot yesterday". Patient not eating and drinking normally.  He denies fevers, sweats, and weight loss.    Past Medical History:  Diagnosis Date  . Benign prostatic hypertrophy   . Chronic headache 10/19/2015  . ED (erectile dysfunction)   . [place] stage [place] disease ([place]   . Essential hypertension   . GERD (gastroesophageal reflux disease)   . GIB (gastrointestinal bleeding)    a. 37/0786s/p R colic artery embolization;  b. 02/2015 EGD: duod ulcerative mass->Bx notable for coagulative necrosis - ? ischemia vs thrombosis-->coumadin d/c'd.  . Gout   . Hearing loss   . Hemorrhoids   . Hyperlipidemia   .  Lymphoma ([place])   . Lymphoma ([place]) 2017  . Multiple thyroid nodules 06/06/2016   Noted on carotid US; dedicated US to be ordered by staff  . Osteoarthrosis, unspecified whether generalized or localized, lower leg   . Persistent atrial fibrillation ([place])    a. CHA2DS2VASc = 3-->coumadin d/c'd 02/2015 2/2 recurrent GIB.  [name] Prostatitis   . Pulmonary hypertension ([place])    a. 10/2014 Echo: EF 60-65%, mild to mod MR, mildly dil LA, nl RV, PASP 46mHg.   .Marland KitchenRenal transplant recipient   . Ulcers of both great toes ([facility]     Past Surgical History:  Procedure Laterality Date  . AV FISTULA PLACEMENT  1998  . BACK SURGERY    . ESOPHAGOGASTRODUODENOSCOPY  03/13/15   severe esophagitis, ulcerated mass  . ESOPHAGOGASTRODUODENOSCOPY (EGD) WITH PROPOFOL N/A 07/09/2016   Procedure: ESOPHAGOGASTRODUODENOSCOPY (EGD) WITH PROPOFOL;  Surgeon: KJonathon Bellows MD;  Location: [facility] ENDOSCOPY;  Service: Endoscopy;  Laterality: N/A;  . HERNIA REPAIR  1974  . KIDNEY TRANSPLANT  2006  . PERIPHERAL VASCULAR CATHETERIZATION N/A 05/07/2016   Procedure: PGlori LuisCath Insertion;  Surgeon: JAlgernon Huxley MD;  [facility];  Service: Cardiovascular;  Laterality: N/A;  . PROSTATE ABLATION    . STOMACH SURGERY     blood vessel burst  . THROAT SURGERY    . TOTAL KNEE ARTHROPLASTY      Family History  Problem Relation Age of Onset  . Cancer Mother        throat  . Diabetes Brother   . Heart disease Brother   . Stroke Brother   . Hypertension Brother   . Diabetes Sister   . Heart disease Sister   . Hypertension Sister   . Diabetes Sister   . Diabetes Brother   . COPD Neg Hx   . Kidney disease Neg Hx   . Prostate cancer Neg Hx   . Kidney cancer Neg Hx   . Bladder Cancer Neg Hx     Social History:  reports that he quit smoking about 38 years ago. His smoking use included Cigarettes. He has a 25.00 pack-year smoking history. He has never used smokeless tobacco. He reports that he does not drink alcohol or use drugs.  He stopped smoking in 1981.  He smoked 3 cigarettes/day.  [place]  The patient is accompanied by his wife, [name]  today.  Allergies: No Known Allergies  Current Medications: Current Outpatient Prescriptions  Medication Sig Dispense Refill  . acetaminophen (TYLENOL) 325 MG tablet Take 2 tablets (650 mg total) by mouth every 6 (six) hours as needed for mild pain (or Fever >/= 101).    .Marland Kitchenalbuterol (PROAIR HFA) 108 (90  BASE) MCG/ACT inhaler Inhale 1-2 puffs into the lungs every 4 (four) hours as needed.     .Marland Kitchenallopurinol (ZYLOPRIM) 100 MG tablet Take 100 mg by mouth daily.      .Marland KitchenCOLCRYS 0.6 MG tablet Take 1 tablet by mouth 2 (two) times daily as needed.    . diphenhydrAMINE (BENADRYL) 25 mg capsule Take 1 capsule (25 mg total) by mouth at bedtime as needed for sleep. 30 capsule 0  . feeding supplement, ENSURE ENLIVE, (ENSURE ENLIVE) LIQD Take 237 mLs by mouth 3 (three) times daily between meals. 90 Bottle 0  . finasteride (PROSCAR) 5 MG tablet Take 1 tablet (5 mg total) by mouth daily. 90 tablet 3  . furosemide (LASIX) 20 MG tablet Take 20 mg by mouth every other  day. Take 1-2 tablets daily.    . hydroxypropyl methylcellulose (ISOPTO TEARS) 2.5 % ophthalmic solution Place 1 drop into both eyes as needed.     . metoprolol tartrate (LOPRESSOR) 25 MG tablet Take 12.5 mg by mouth 2 (two) times daily.     . Multiple Vitamin (MULTIVITAMIN) tablet Take 1 tablet by mouth daily.      . ondansetron (ZOFRAN) 4 MG tablet Take 1 tablet (4 mg total) by mouth every 6 (six) hours as needed for nausea. 20 tablet 0  . oxybutynin (DITROPAN-XL) 5 MG 24 hr tablet Take 1 tablet (5 mg total) by mouth daily. 90 tablet 3  . oxyCODONE-acetaminophen (ROXICET) 5-325 MG tablet Take 1-2 tablets by mouth every 6 (six) hours as needed. 15 tablet 0  . pantoprazole (PROTONIX) 40 MG tablet TAKE 1 TABLET BY MOUTH TWICE A DAY 60 tablet 1  . pantoprazole (PROTONIX) 40 MG tablet TAKE 1 TABLET BY MOUTH TWICE A DAY 60 tablet 1  . predniSONE (DELTASONE) 20 MG tablet as directed.     . predniSONE (DELTASONE) 5 MG tablet Take 5 mg by mouth daily.     . simethicone (MYLICON) 80 MG chewable tablet Chew 1 tablet (80 mg total) by mouth every 6 (six) hours as needed for flatulence. 120 tablet 0  . sucralfate (CARAFATE) 1 g tablet Take 1 tablet (1 g total) by mouth 4 (four) times daily. Resume taking after one week- once finished taking oral levaquine. ( to  avoid interaction.) 40 tablet 3  . tacrolimus (PROGRAF) 1 MG capsule Take 3 mg by mouth 2 (two) times daily. Reported on 08/16/2015    . tamsulosin (FLOMAX) 0.4 MG CAPS capsule Take 1 capsule (0.4 mg total) by mouth daily. 90 capsule 3  . traZODone (DESYREL) 50 MG tablet 1-2 TABS AS NEEDED FOR SLEEP  3   No current facility-administered medications for this visit.    Facility-Administered Medications Ordered in Other Visits  Medication Dose Route Frequency Provider Last Rate Last Dose  . sodium chloride flush (NS) 0.9 % injection 10 mL  10 mL Intracatheter PRN Lequita Asal, MD        Review of Systems:  GENERAL:  Feels "ok".  No fevers or sweats.  Weight stable.  PERFORMANCE STATUS (ECOG):  1 HEENT:  No visual changes, sore throat, mouth sores or tenderness. Lungs: No shortness of breath or cough.  No hemoptysis. Cardiac:  No chest pain, palpitations, orthopnea, or PND. GI:  Appetite decreased.  No nausea, vomiting, diarrhea, constipation, melena or hematochezia. GU:  Enlarged prostate.  No urgency, frequency, dysuria, or hematuria.  Left inguinal hernia. Right inguinal pain. Musculoskeletal: No back pain.  No joint pain.  No muscle tenderness. Extremities:  No pain or swelling. Skin:  Nail changes.  No rashes or skin changes. Neuro:  No headache, numbness or weakness, balance or coordination issues. Endocrine:  No diabetes, thyroid issues, hot flashes or night sweats. Psych:  No mood changes, depression or anxiety. Pain:  Pain right inguinal area.   Review of systems:  All other systems reviewed and found to be negative.  Physical Exam: Blood pressure (!) 145/54, pulse (!) 54, temperature (!) 97 F (36.1 C), temperature source Tympanic, height 5' 6"  (1.676 m), weight 121 lb (54.9 kg). GENERAL:  Thin elderly gentleman sitting comfortably in the exam room in a wheelchair under a blanket in no acute distress.  MENTAL STATUS:  Alert and oriented to person, place and time. HEAD:   [place].  Temporal wasting.  Normocephalic, atraumatic, face symmetric, no Cushingoid features. EYES:  Glasses.  Brown eyes.  Pupils equal round and reactive to light and accomodation.  No conjunctivitis or scleral icterus. ENT:  Oropharynx clear without lesion.  Edentulous.  Tongue normal. Mucous membranes dry.  RESPIRATORY:  Clear to auscultation without rales, wheezes or rhonchi. CARDIOVASCULAR:  Regular rate and rhythm without murmur, rub or gallop. ABDOMEN:  Soft, non-tender, with active bowel sounds, and no hepatosplenomegaly.  No masses.  Palpable transplanted kidney.  GU:  Tender right groin. SKIN:  No rashes, ulcers or lesions. EXTREMITIES:  No edema, no skin discoloration or tenderness.  Right upper extremity with dilated vascular s/p AV fistula (chronic).  No palpable cords. NEUROLOGICAL: Unremarkable. PSYCH:  Appropriate.   Imaging studies:  04/28/2016 PET scan:  Bulky intensely hypermetabolic periaortic upper abdominal and mesenteric adenopathy concerning for high-grade lymphoma.  There was hypermetabolic liver metastasis.  There was hpermetabolic lesion involving the small bowel of the upper pelvis.  There were multiple sites of hypermetabolic skeletal metastasis. There was moderate volume right pneumothorax. 08/12/2016 PET scan:  Interval response to therapy. There has been significant decrease in extent of hypermetabolic tumor within the neck, chest, abdomen and pelvis as well as the axial and appendicular skeleton.  There was residual enlarged and hypermetabolic small bowel mesenteric and periaortic lymph nodes. There was a persistent hypermetabolic focus of increased uptake within the spleen.  There was resolution of previous multifocal hypermetabolic lesions within the liver. 10/10/2016 PET scan:  Slight worsening compared to the prior study. Several lesions were slightly more hypermetabolic and there appeared to be a newly enlarged and hypermetabolic mesenteric node adjacent to  one of the previous mesenteric lymph nodes. However, there are no new hypermetabolic lesions in the neck, chest, or skeleton.  The small hypermetabolic peripheral lesion of the right kidney upper pole is slightly less hypermetabolic. 08/81/1031 PET scan:  Marked progression of disease as evidenced by progressive bulky mesenteric adenopathy, new low left internal jugular/left juxta diaphragmatic/abdominal retroperitoneal adenopathy, new pulmonary nodules, new hepatic and splenic lesions and new peritoneal nodules, all of which are hypermetabolic 59/45/8592 CT abdomen and pelvis: Slight increased size of left lower lobe pulmonary metastatic nodule. Re-demonstrated hypodense liver masses and splenic masses, consistent with metastatic disease, suspect increased in size compared to prior PET-CT. Bulky mesenteric and retroperitoneal masses consistent with metastatic disease, also increased in size compared to recent PET-CT. Atrophic native kidneys. Transplant kidney in the right lower quadrant. Negative for hydronephrosis. 03/08/2017:  Scrotal ultrasound revealed soft tissue structure in the right scrotum separate from the epididymis and testicle. There was internal blood flow. The appearance could represent bowel herniated into the right side of the scrotum but there  was no peristalsis and there was no definitive hernia seen on a recent CT. 03/08/2017:  Abdomen CT revealed stable lingular and left lower lobe pulmonary metastatic nodules to the extent included.  There was slight interval increase in size of several hepatic metastatic lesions (4.3 cm vs 3.6 cm; 5.2 cm vs 4.9 cm).  There was equivocal overall change with reference to retroperitoneal and mesenteric lymphadenopathy, some areas appear to have increased in size such as the retrocaval lymph node mass (4.1 x 6.4 cm vs 6 x 3.1 cm), others remaining stable such as the left para-aortic and pelvic lymph node index masses and others appearing slightly smaller in  the lower abdominal mesentery.   There was some mild nonspecific scrotal skin thickening.  There was no bowel herniation  or definite intratesticular mass. Tubular densities were seen within the scrotum bilaterally which were likely related to the spermatic cord and vessels.   There was no significant hydrocele.   Appointment on 03/10/2017  Component Date Value Ref Range Status  . WBC 03/10/2017 26.0* 3.8 - 10.6 K/uL Final  . RBC 03/10/2017 3.36* 4.40 - 5.90 MIL/uL Final  . Hemoglobin 03/10/2017 9.3* 13.0 - 18.0 g/dL Final  . HCT 03/10/2017 27.2* 40.0 - 52.0 % Final  . MCV 03/10/2017 80.9  80.0 - 100.0 fL Final  . MCH 03/10/2017 27.8  26.0 - 34.0 pg Final  . MCHC 03/10/2017 34.4  32.0 - 36.0 g/dL Final  . RDW 03/10/2017 21.0* 11.5 - 14.5 % Final  . Platelets 03/10/2017 129* 150 - 440 K/uL Final  . Neutrophils Relative % 03/10/2017 92  % Final  . Neutro Abs 03/10/2017 24.1* 1.4 - 6.5 K/uL Final  . Lymphocytes Relative 03/10/2017 2  % Final  . Lymphs Abs 03/10/2017 0.6* 1.0 - 3.6 K/uL Final  . Monocytes Relative 03/10/2017 5  % Final  . Monocytes Absolute 03/10/2017 1.2* 0.2 - 1.0 K/uL Final  . Eosinophils Relative 03/10/2017 1  % Final  . Eosinophils Absolute 03/10/2017 0.1  0 - 0.7 K/uL Final  . Basophils Relative 03/10/2017 0  % Final  . Basophils Absolute 03/10/2017 0.0  0 - 0.1 K/uL Final  . Sodium 03/10/2017 131* 135 - 145 mmol/L Final  . Potassium 03/10/2017 5.1  3.5 - 5.1 mmol/L Final  . Chloride 03/10/2017 102  101 - 111 mmol/L Final  . CO2 03/10/2017 17* 22 - 32 mmol/L Final  . Glucose, Bld 03/10/2017 64* 65 - 99 mg/dL Final  . BUN 03/10/2017 30* 6 - 20 mg/dL Final  . Creatinine, Ser 03/10/2017 1.80* 0.61 - 1.24 mg/dL Final  . Calcium 03/10/2017 9.9  8.9 - 10.3 mg/dL Final  . GFR calc non Af Amer 03/10/2017 35* >60 mL/min Final  . GFR calc Af Amer 03/10/2017 40* >60 mL/min Final   Comment: (NOTE) The eGFR has been calculated using the CKD EPI equation. This calculation has  not been validated in all clinical situations. eGFR's persistently <60 mL/min signify possible Chronic Kidney Disease.   . Anion gap 03/10/2017 12  5 - 15 Final  . LDH 03/10/2017 501* 98 - 192 U/L Final  Admission on 03/08/2017, Discharged on 03/08/2017  Component Date Value Ref Range Status  . Sodium 03/08/2017 138  135 - 145 mmol/L Final  . Potassium 03/08/2017 5.5* 3.5 - 5.1 mmol/L Final  . Chloride 03/08/2017 110  101 - 111 mmol/L Final  . CO2 03/08/2017 23  22 - 32 mmol/L Final  . Glucose, Bld 03/08/2017 92  65 - 99 mg/dL Final  . BUN 03/08/2017 24* 6 - 20 mg/dL Final  . Creatinine, Ser 03/08/2017 1.52* 0.61 - 1.24 mg/dL Final  . Calcium 03/08/2017 9.9  8.9 - 10.3 mg/dL Final  . Total Protein 03/08/2017 5.9* 6.5 - 8.1 g/dL Final  . Albumin 03/08/2017 3.3* 3.5 - 5.0 g/dL Final  . AST 03/08/2017 37  15 - 41 U/L Final  . ALT 03/08/2017 11* 17 - 63 U/L Final  . Alkaline Phosphatase 03/08/2017 177* 38 - 126 U/L Final  . Total Bilirubin 03/08/2017 1.0  0.3 - 1.2 mg/dL Final  . GFR calc non Af Amer 03/08/2017 43* >60 mL/min Final  . GFR calc Af Amer 03/08/2017 50* >60 mL/min Final   Comment: (NOTE) The eGFR has been calculated using  the CKD EPI equation. This calculation has not been validated in all clinical situations. eGFR's persistently <60 mL/min signify possible Chronic Kidney Disease.   . Anion gap 03/08/2017 5  5 - 15 Final  . WBC 03/08/2017 26.0* 3.8 - 10.6 K/uL Final  . RBC 03/08/2017 3.54* 4.40 - 5.90 MIL/uL Final  . Hemoglobin 03/08/2017 9.5* 13.0 - 18.0 g/dL Final  . HCT 03/08/2017 28.6* 40.0 - 52.0 % Final  . MCV 03/08/2017 80.7  80.0 - 100.0 fL Final  . MCH 03/08/2017 27.0  26.0 - 34.0 pg Final  . MCHC 03/08/2017 33.4  32.0 - 36.0 g/dL Final  . RDW 03/08/2017 20.3* 11.5 - 14.5 % Final  . Platelets 03/08/2017 83* 150 - 440 K/uL Final  . Neutrophils Relative % 03/08/2017 94  % Final  . Neutro Abs 03/08/2017 24.3* 1.4 - 6.5 K/uL Final  . Lymphocytes Relative  03/08/2017 2  % Final  . Lymphs Abs 03/08/2017 0.6* 1.0 - 3.6 K/uL Final  . Monocytes Relative 03/08/2017 4  % Final  . Monocytes Absolute 03/08/2017 1.0  0.2 - 1.0 K/uL Final  . Eosinophils Relative 03/08/2017 0  % Final  . Eosinophils Absolute 03/08/2017 0.1  0 - 0.7 K/uL Final  . Basophils Relative 03/08/2017 0  % Final  . Basophils Absolute 03/08/2017 0.0  0 - 0.1 K/uL Final  . Lactic Acid, Venous 03/08/2017 1.6  0.5 - 1.9 mmol/L Final    Assessment:  Matthew Brown is a 76 y.o. male s/p renal transplant (2007) with a post-transplant lymphoproliferative disorder, stage IV diffuse large B cell lymphoma.  He presented with a 2-3 month history of progressive back pain superimposed on chronic back pain.    PET scan on 04/28/2016 revealed bulky intensely hypermetabolic periaortic upper abdominal and mesenteric adenopathy concerning for high-grade lymphoma.  There was hypermetabolic liver metastasis.  There was hpermetabolic lesion involving the small bowel of the upper pelvis.  There were multiple sites of hypermetabolic skeletal metastasis. There was moderate volume right pneumothorax.  CT guided retroperitoneal node biopsy on 04/30/2016 revealed diffuse large B cell lymphoma.  Hepatitis B and C testing were negative on 05/06/2016 and 02/03/2017.  Echo on 05/06/2016 revealed an EF of 55-60%.  Echo on 09/01/2016 revealed an EF of 60-65%.  Bone marrow aspirate and biopsy on 05/08/2016 revealed multifocal marrow involvement by diffuse large B-cell lymphoma. There was variably cellular marrow for age (50% - 90%) with a patchy predominantly nodular large B-cell infiltrate, overall estimated to account for 20% of the core biopsy. There was adequate residual trilineage hematopoiesis with mild nonspecific dyserythropoiesis. There was patchy mild increase in reticulin. Storage iron was present. The immunohistochemical staining pattern of the B-cell infiltrate (CD10 +/-, BCL 6+, BCL-2 +) suggested possible  large cell transformation of follicular lymphoma.  Flow cytometry revealed no significant immunophenotypic abnormalities or evidence of B-cell lymphoma.  He has bone metastasis and hypercalcemia.  Calcium was 11.2 (ionized 7.3) on 05/13/2016.  He received Zometa on 05/13/2016.  He began [place] on 06/09/2016 (last 08/05/2016).  Work-up on 04/15/2016 revealed the following normal studies: ferritin (343), iron saturation (6%), TIBC (241; low), B12 (525), folate (27).  Reticulocyte count was 2%.  LDH was 409.  Uric acid was 7.7 (4.4 - 7.6).  He was admitted at [facility] from 04/28/2016 - 04/30/2016 with a moderate volume right sided pneumothorax.  His pneumothorax improved spontaneously. Plain films of the right femur revealed the lucent bone lesion in the right femoral neck  was  poorly characterized.  There was no evidence for an acute fracture.  PTH was 106 (high) 04/30/2016 with a calcium of 11.2.  Etiology was c/w primary hyperparathyroidism.  PTH-related polypeptide was < 1.1 on 04/30/2016.  He has a history of GI bleeding in 08/2014.  He underwent tagged RBC scan which revealed an active bleed in the hepatic flexure.  He was embolized in vascular interventional radiology.  He was admitted to [facility] from 07/08/2016 - 07/13/2016 with GI bleeding.  EGD on 07/09/2016 revealed multiple non-bleeding duodenal ulcer as well as duodenitis. Tagged RBC scan on 07/11/2016 revealed active GI bleed in the lateral right mid abdomen coursing through multiple curvilinear bowel loops in the right mid abdomen favoring a small bowel source of bleeding.  He received 5 units of PRBCs, 2 units of pheresed platelets, and vitamin K from 07/07/2016 0 07/13/2016.  He was transferred to [facility] from 07/13/2016 - 07/16/2016.  [facility] performed a colonoscopy and push enteroscopy which showed several diverticula and old blood but no source of bleeding. Source of bleed was presumed to be diverticular, although it was possible a small bowel site  was not visualized on endoscopy.  He has a history of renal failure s/p renal transplant.  He is tacrolimus (Prograf), and steroids  Creatinine has ranged between 1.43 - 1.93 in the past 6 months. Mycophenolate (MMF) was discontinued at diagnosis.  Prograf level was 5.5 (3.0-8.0) on 05/08/2016, 2.2 on 08/13/2016, 2.3 in 01/01/2017.  He received 6 cycles of mini-RCHOP (05/09/2016 - 09/05/2016).  Cycle #1 was complicated by fever and neutropenia.  Cycle #3 was complicated by a GI bleed.  CSF on 06/19/2016 revealed 7 WBCs (4% segs, 79% lymphs, and 17% monocytes).  He has undergone LP with IT MTX x 4 (07/24/2016, 08/14/2016, 09/04/2016, and 10/02/2016).  Cytology was negative on 07/24/2016 and 09/04/2016.  PET scan on 10/10/2016 revealed slight worsening compared to the prior study. Several lesions were slightly more hypermetabolic and there appeared to be a newly enlarged and hypermetabolic mesenteric node adjacent to one of the previous mesenteric lymph nodes. However, there are no new hypermetabolic lesions in the neck, chest, or skeleton.  The small hypermetabolic peripheral lesion of the right kidney upper pole is slightly less hypermetabolic.  He was lost to follow-up.  PET scan on 01/14/2017 revealed marked progression of disease as evidenced by progressive bulky mesenteric adenopathy, new low left internal jugular/left juxta diaphragmatic/abdominal retroperitoneal adenopathy, new pulmonary nodules, new hepatic and splenic lesions and new peritoneal nodules, all of which are hypermetabolic.  LDH was 480 on 02/06/2017.  He is s/p 2 cycles of gemcitabine and oxaliplatin + obinutuzumab (02/04/2017 - 02/24/2017) with Neulasta support.   Abdomen CT on 03/08/2017 revealed stable lingular and left lower lobe pulmonary metastatic nodules.  There was slight interval increase in size of several hepatic metastatic lesions (4.3 cm vs 3.6 cm; 5.2 cm vs 4.9 cm).  There was equivocal overall change with  reference to retroperitoneal and mesenteric lymphadenopathy, some areas appear to have increased in size such as the retrocaval lymph node mass (4.1 x 6.4 cm vs 6 x 3.1 cm), others remaining stable such as the left para-aortic and pelvic lymph node index masses and others appearing slightly smaller in the lower abdominal mesentery.   There was some mild nonspecific scrotal skin thickening.  Tubular densities within the scrotum bilaterally were likely related to the spermatic cord and vessels.   There was no significant hydrocele.  Symptomatically, he feels "ok".  He has  RIGHT groin pain again. He is not eating and drinking normally because of the pain.  He denies any fevers or sweats.  Exam reveals tender right groin.   Creatinine is elevated secondary to poor fluid intake.  Plan:  1.  Labs today: CBC with diff, BMP, uric acid. 2.  Discuss disease progression and need to change treatment. Discuss limited options.  Patient wishes to try 3rd line therapy.  Options limited by renal function s/p transplant.  Treatment is palliative as patient not a candidate for stem cell transplant.  Treatment plan coordinated with Dr. Lonia Blood.  3.  Discuss code status today.  Patient confirmed code status as FULL CODE.  Will readdress if progresses on new regimen. 4.  Discuss importance of fluid and caloric intake.  IVF today- 1000cc NS.  5.  RTC in 1 week for MD assessment, labs (CBC with, CMP, uric acid, LDH), and initiation of therapy.  Addendum:  Decision made to proceed with monthly Rituxan 375 mg/m2 on day 1 and Revlimid 15 mg QOD x 21 days then off 7 days.  Cycle length 28 days.  Honor Loh, NP 03/10/2017,11:31 AM    I saw and evaluated the patient, participating in the key portions of the service and reviewing pertinent diagnostic studies and records.  I reviewed the nurse practitioner's note and agree with the findings and the plan.  The assessment and plan were discussed with the patient.  Multiple questions  were asked by the patient and answered.   Lequita Asal, MD 03/10/2017,2:17 PM

## 2017-03-10 NOTE — Telephone Encounter (Signed)
Attempted to call patient regarding whether or not he is taking allopurinol.  No answer.  Will try later.

## 2017-03-10 NOTE — Telephone Encounter (Signed)
-----   Message from Lequita Asal, MD sent at 03/10/2017 12:49 PM EDT ----- Regarding: Ensure patient taking allopurinol   ----- Message ----- From: Interface, Lab In [place] Sent: 03/10/2017  10:17 AM To: Lequita Asal, MD

## 2017-03-10 NOTE — Telephone Encounter (Signed)
Called back to patient to verify whether or not he is taking allopurinol.  He states he is taking every day.

## 2017-03-10 NOTE — Telephone Encounter (Signed)
-----   Message from Lequita Asal, MD sent at 03/10/2017 12:49 PM EDT ----- Regarding: Ensure patient taking allopurinol   ----- Message ----- From: Interface, Lab In [place] Sent: 03/10/2017  10:17 AM To: Lequita Asal, MD

## 2017-03-10 NOTE — Progress Notes (Signed)
DISCONTINUE ON PATHWAY REGIMEN - Lymphoma and CLL  Other Clinical Trial: gemcitabine  REASON: Disease Progression PRIOR TREATMENT: Other Trial - gemcitabine TREATMENT RESPONSE: Progressive Disease (PD)  START ON PATHWAY REGIMEN - Lymphoma and CLL  Other Clinical Trial: revlimid  Administration Notes: Rituxan and Revlimid  Patient Characteristics: Diffuse Large B Cell Lymphoma, Relapsed / Refractory, All Stages, Third Line and Beyond Disease Type: Not Applicable Disease Type: Diffuse Large B Cell Line of therapy: Relapsed / Refractory - Third Line and Beyond [employer] Stage: IV Intent of Therapy: Non-Curative / Palliative Intent, Discussed with Patient

## 2017-03-10 NOTE — Progress Notes (Signed)
START ON PATHWAY REGIMEN - Lymphoma and CLL  Other Clinical Trial: rituxan  Administration Notes: Rituxan  Patient Characteristics: Diffuse Large B Cell Lymphoma, Relapsed / Refractory, All Stages, Third Line and Beyond Disease Type: Not Applicable Disease Type: Diffuse Large B Cell Line of therapy: Relapsed / Refractory - Third Line and Beyond [employer] Stage: IV Intent of Therapy: Non-Curative / Palliative Intent, Discussed with Patient

## 2017-03-10 NOTE — Progress Notes (Signed)
Patient here for follow up. He has major complaints of groin pain he had an injection of lidocaine in the ED that has made the pain worse. Appetite poor due to constant pain.

## 2017-03-12 ENCOUNTER — Telehealth: Payer: Self-pay | Admitting: *Deleted

## 2017-03-12 ENCOUNTER — Other Ambulatory Visit: Payer: Self-pay | Admitting: Urgent Care

## 2017-03-12 ENCOUNTER — Other Ambulatory Visit: Payer: Self-pay | Admitting: Hematology and Oncology

## 2017-03-12 MED ORDER — MEGESTROL ACETATE 400 MG/10ML PO SUSP: 200.0000 mg | Freq: Every day | ORAL | 0 refills | Status: DC

## 2017-03-12 NOTE — Telephone Encounter (Signed)
-----   Message from Lequita Asal, MD sent at 03/12/2017  8:52 AM EDT ----- Regarding: Appetite  We can start him on Megace 200 mg a day. Liquid 40 mg/ml (5 ml a day).  M  ----- Message ----- From: Cammie Sickle, MD Sent: 03/12/2017   8:30 AM To: Karen Kitchens, NP, Lequita Asal, MD  Patient's family called after hours yesterday- needing "something for appetite". Requesting a call back.  GB

## 2017-03-12 NOTE — Telephone Encounter (Signed)
Spoke with patient's significant other that we are going to send Megace for patient's appetite.  Also advised her that it can be costly.  [facility] has it for about $55.  We were asked to send it to [facility] on [address].

## 2017-03-12 NOTE — Telephone Encounter (Signed)
Called patient and spoke to significant other to inform them that Revlimid has been ordered.  They can expect a call from [facility] in [place].  Patient will have to do a survey before medication can be sent to him.  Verbalized understanding.

## 2017-03-13 ENCOUNTER — Telehealth: Payer: Self-pay | Admitting: *Deleted

## 2017-03-13 NOTE — Telephone Encounter (Signed)
Called and spoke to [name] to inquire if patient has received his Revlimid.  It was delivered today.  Advised patient to not take it yet, as MD wants him to start it the same day he gets his Rituxan.

## 2017-03-17 ENCOUNTER — Inpatient Hospital Stay: Payer: Medicare HMO

## 2017-03-17 ENCOUNTER — Inpatient Hospital Stay: Payer: Medicare HMO | Admitting: Hematology and Oncology

## 2017-03-17 NOTE — Unmapped (Signed)
[facility] Refill and Clinical Coordination Note  Medication(s): PROGRAF 1    Taylor Ramos, DOB: Nov 02, 1940  Phone: [phone] (home) , Alternate phone contact: N/A  Shipping address: [address]  [place] [address]  Phone or address changes today?: No  All above HIPAA information verified.  Insurance changes? No    Completed refill and clinical call assessment today to schedule patient's medication shipment from the [facility] [phone]).      MEDICATION RECONCILIATION    Confirmed the medication and dosage are correct and have not changed: Yes, regimen is correct and unchanged.    Were there any changes to your medication(s) in the past month:  No, there are no changes reported at this time.    ADHERENCE    Is this medicine transplant or covered by Medicare Part B? Yes.    Prograf 1 mg   Quantity filled last month: 180   # of tablets left on hand: 60        Did you miss any doses in the past 4 weeks? No missed doses reported.  Adherence counseling provided? Not needed     SIDE EFFECT MANAGEMENT    Are you tolerating your medication?:  [name] reports tolerating the medication.  Side effect management discussed: None      Therapy is appropriate and should be continued.    Evidence of clinical benefit: See Epic note from 03/04/17      FINANCIAL/SHIPPING    Delivery Scheduled: Yes, Expected medication delivery date: 03/26/17   Additional medications refilled: No additional medications/refills needed at this time.    [name] did not have any additional questions at this time.    Delivery address validated in [facility] scheduling system: Yes, address listed above is correct.      We will follow up with patient monthly for standard refill processing and delivery.      Thank you,  Mickle Mallory   [facility] Specialty Pharmacist

## 2017-03-19 ENCOUNTER — Encounter: Payer: Self-pay | Admitting: Hematology and Oncology

## 2017-03-19 ENCOUNTER — Telehealth: Payer: Self-pay | Admitting: Pharmacist

## 2017-03-19 ENCOUNTER — Inpatient Hospital Stay (HOSPITAL_BASED_OUTPATIENT_CLINIC_OR_DEPARTMENT_OTHER): Payer: Medicare HMO | Admitting: Hematology and Oncology

## 2017-03-19 ENCOUNTER — Other Ambulatory Visit: Payer: Self-pay | Admitting: *Deleted

## 2017-03-19 ENCOUNTER — Other Ambulatory Visit: Payer: Self-pay | Admitting: Hematology and Oncology

## 2017-03-19 ENCOUNTER — Other Ambulatory Visit: Payer: Self-pay | Admitting: Urgent Care

## 2017-03-19 ENCOUNTER — Inpatient Hospital Stay: Payer: Medicare HMO

## 2017-03-19 VITALS — BP 135/71 | Temp 95.5°F | Wt 117.3 lb

## 2017-03-19 DIAGNOSIS — C8333 Diffuse large B-cell lymphoma, intra-abdominal lymph nodes: Secondary | ICD-10-CM

## 2017-03-19 DIAGNOSIS — D649 Anemia, unspecified: Secondary | ICD-10-CM

## 2017-03-19 DIAGNOSIS — Z87891 Personal history of nicotine dependence: Secondary | ICD-10-CM

## 2017-03-19 DIAGNOSIS — I129 Hypertensive chronic kidney disease with stage 1 through stage 4 chronic kidney disease, or unspecified chronic kidney disease: Secondary | ICD-10-CM | POA: Diagnosis not present

## 2017-03-19 DIAGNOSIS — E785 Hyperlipidemia, unspecified: Secondary | ICD-10-CM

## 2017-03-19 DIAGNOSIS — Z5112 Encounter for antineoplastic immunotherapy: Secondary | ICD-10-CM | POA: Diagnosis not present

## 2017-03-19 DIAGNOSIS — R1031 Right lower quadrant pain: Secondary | ICD-10-CM

## 2017-03-19 DIAGNOSIS — Z800 Family history of malignant neoplasm of digestive organs: Secondary | ICD-10-CM

## 2017-03-19 DIAGNOSIS — N5089 Other specified disorders of the male genital organs: Secondary | ICD-10-CM | POA: Diagnosis not present

## 2017-03-19 DIAGNOSIS — N400 Enlarged prostate without lower urinary tract symptoms: Secondary | ICD-10-CM | POA: Diagnosis not present

## 2017-03-19 DIAGNOSIS — Z940 Kidney transplant status: Secondary | ICD-10-CM

## 2017-03-19 DIAGNOSIS — M1990 Unspecified osteoarthritis, unspecified site: Secondary | ICD-10-CM

## 2017-03-19 DIAGNOSIS — E210 Primary hyperparathyroidism: Secondary | ICD-10-CM | POA: Diagnosis not present

## 2017-03-19 DIAGNOSIS — D47Z1 Post-transplant lymphoproliferative disorder (PTLD): Secondary | ICD-10-CM

## 2017-03-19 DIAGNOSIS — I4891 Unspecified atrial fibrillation: Secondary | ICD-10-CM

## 2017-03-19 DIAGNOSIS — N186 End stage renal disease: Secondary | ICD-10-CM

## 2017-03-19 DIAGNOSIS — R634 Abnormal weight loss: Secondary | ICD-10-CM | POA: Diagnosis not present

## 2017-03-19 DIAGNOSIS — K219 Gastro-esophageal reflux disease without esophagitis: Secondary | ICD-10-CM

## 2017-03-19 DIAGNOSIS — M109 Gout, unspecified: Secondary | ICD-10-CM

## 2017-03-19 DIAGNOSIS — R944 Abnormal results of kidney function studies: Secondary | ICD-10-CM | POA: Diagnosis not present

## 2017-03-19 DIAGNOSIS — Z79899 Other long term (current) drug therapy: Secondary | ICD-10-CM

## 2017-03-19 DIAGNOSIS — C7800 Secondary malignant neoplasm of unspecified lung: Secondary | ICD-10-CM

## 2017-03-19 DIAGNOSIS — C7889 Secondary malignant neoplasm of other digestive organs: Secondary | ICD-10-CM

## 2017-03-19 DIAGNOSIS — C7951 Secondary malignant neoplasm of bone: Secondary | ICD-10-CM

## 2017-03-19 DIAGNOSIS — I272 Pulmonary hypertension, unspecified: Secondary | ICD-10-CM

## 2017-03-19 DIAGNOSIS — C8590 Non-Hodgkin lymphoma, unspecified, unspecified site: Secondary | ICD-10-CM

## 2017-03-19 DIAGNOSIS — C8330 Diffuse large B-cell lymphoma, unspecified site: Secondary | ICD-10-CM

## 2017-03-19 DIAGNOSIS — C787 Secondary malignant neoplasm of liver and intrahepatic bile duct: Secondary | ICD-10-CM

## 2017-03-19 MED ORDER — OXYCODONE-ACETAMINOPHEN 5-325 MG PO TABS: 1.0000 | ORAL_TABLET | Freq: Four times a day (QID) | ORAL | 0 refills | Status: DC | PRN

## 2017-03-19 NOTE — Progress Notes (Signed)
Patient states he is having to take more pain medication.  The area in his groin is still painful.  Appetite is better. Patient is having hiccups frequently.

## 2017-03-19 NOTE — Telephone Encounter (Signed)
Oral Chemotherapy Pharmacist Encounter I spoke with patient and his wife following there OV today for overview of new oral chemotherapy medication: Revlimid (lenalidomide) for the treatment of DLBCL, planned duration until disease progression or unacceptable drug toxicity.   CBC/ CMP from 03/10/17 assessed, no relevant lab abnormalities. Prescription dose and frequency assessed.   Current medication list in Epic reviewed, no DDIs with Revlimid identified:  They report that they have received the medication. They will start the medication on 03/20/17 along with the start of rituximab.   Counseled patient on administration, dosing, side effects, monitoring, drug-food interactions, safe handling, storage, and disposal. Patient will take Take 1 capsule (15 mg total) by mouth every other day. for 21 days then off 7 days.  Side effects include but not limited to: Diarrhea, fatigue, rash, anemia, neutropenia, thrombocytopenia, constipation.    Reviewed with patient importance of keeping a medication schedule and plan for any missed doses.  They both voiced understanding and appreciation. All questions answered. I made Matthew Brown a calendar to keep track of every other day dosing. Will give to patient tomorrow in clinic.   Provided patient with [facility] phone number. Patient knows to call the office with questions or concerns. Oral Chemotherapy [facility] will continue to follow.  Thank you,  Darl Pikes, PharmD, BCPS Hematology/Oncology Clinical Pharmacist [facility] [place] [phone]  03/19/2017 5:40 PM

## 2017-03-19 NOTE — Progress Notes (Signed)
[facility] day:  03/19/2017   Chief Complaint: Matthew Brown is a 76 y.o. male with post-transplant lymphoproliferative disorder, stage IVBE diffuse large B cell lymphoma, who is seen for assessment prior to initiation of Revlimid and Rituxan.  HPI:  The patient was last seen in the medical oncology symptom management clinic on 03/10/2017.  At that time, he felt "ok".  He had right groin pain.  Oxycodone 5 mg made him sleepy.  Scans revealed progressive disease.  Creatinine was elevated secondary to poor fluid intake.  He received IVF.  Decision made to proceed with Rituxan monthly and Revlimid.  Symptomatically, he is doing "ok". Patient continues to lose weight despite Megace 200 mg. He has lost 4 pounds since his last visit. Medication is stimulating his appetite.  He denies fevers, sweats, and bleeding. He has had no interval infections. Patient continues to have RIGHT groin pain (mild) that radiates "up into my side". Pain has improved on Percocet 5/385m every 6 hours PRN.  He denies pain in the clinic today. He is not using the recommended Ibuprofen because he is concerned with it effecting his renal function.   Past Medical History:  Diagnosis Date  . Benign prostatic hypertrophy   . Chronic headache 10/19/2015  . ED (erectile dysfunction)   . End stage renal disease ([place]   . Essential hypertension   . GERD (gastroesophageal reflux disease)   . GIB (gastrointestinal bleeding)    a. 34/0981s/p R colic artery embolization;  b. 02/2015 EGD: duod ulcerative mass->Bx notable for coagulative necrosis - ? ischemia vs thrombosis-->coumadin d/c'd.  . Gout   . Hearing loss   . Hemorrhoids   . Hyperlipidemia   . Lymphoma ([place]   . Lymphoma ([place] 2017  . Multiple thyroid nodules 06/06/2016   Noted on carotid [place] dedicated UKoreato be ordered by staff  . Osteoarthrosis, unspecified whether generalized or localized, lower leg   . Persistent atrial  fibrillation ([place]    a. CHA2DS2VASc = 3-->coumadin d/c'd 02/2015 2/2 recurrent GIB.  .Marland KitchenProstatitis   . Pulmonary hypertension ([place]    a. 10/2014 Echo: EF 60-65%, mild to mod MR, mildly dil LA, nl RV, PASP 815mg.  . Marland Kitchenenal transplant recipient   . [facility]    Past Surgical History:  Procedure Laterality Date  . AV FISTULA PLACEMENT  1998  . BACK SURGERY    . ESOPHAGOGASTRODUODENOSCOPY  03/13/15   severe esophagitis, ulcerated mass  . ESOPHAGOGASTRODUODENOSCOPY (EGD) WITH PROPOFOL N/A 07/09/2016   Procedure: ESOPHAGOGASTRODUODENOSCOPY (EGD) WITH PROPOFOL;  Surgeon: KiJonathon BellowsMD;  Location: [facility] ENDOSCOPY;  Service: Endoscopy;  Laterality: N/A;  . HERNIA REPAIR  1974  . KIDNEY TRANSPLANT  2006  . PERIPHERAL VASCULAR CATHETERIZATION N/A 05/07/2016   Procedure: PoGlori Luisath Insertion;  Surgeon: JaAlgernon HuxleyMD;  [place] [facility];  Service: Cardiovascular;  Laterality: N/A;  . PROSTATE ABLATION    . STOMACH SURGERY     blood vessel burst  . THROAT SURGERY    . TOTAL KNEE ARTHROPLASTY      Family History  Problem Relation Age of Onset  . Cancer Mother        throat  . Diabetes Brother   . Heart disease Brother   . Stroke Brother   . Hypertension Brother   . Diabetes Sister   . Heart disease Sister   . Hypertension Sister   . Diabetes Sister   .  Diabetes Brother   . COPD Neg Hx   . Kidney disease Neg Hx   . Prostate cancer Neg Hx   . Kidney cancer Neg Hx   . Bladder Cancer Neg Hx     Social History:  reports that he quit smoking about 38 years ago. His smoking use included Cigarettes. He has a 25.00 pack-year smoking history. He has never used smokeless tobacco. He reports that he does not drink alcohol or use drugs.  He stopped smoking in 1981.  He smoked 3 cigarettes/day.  He lives in [place].  The patient is accompanied by his wife, Matthew Brown,  today.  Allergies: No Known Allergies  Current Medications: Current Outpatient Prescriptions   Medication Sig Dispense Refill  . acetaminophen (TYLENOL) 325 MG tablet Take 2 tablets (650 mg total) by mouth every 6 (six) hours as needed for mild pain (or Fever >/= 101).    [name] albuterol (PROAIR HFA) 108 (90 BASE) MCG/ACT inhaler Inhale 1-2 puffs into the lungs every 4 (four) hours as needed.     [name] allopurinol (ZYLOPRIM) 100 MG tablet Take 100 mg by mouth daily.      [name] COLCRYS 0.6 MG tablet Take 1 tablet by mouth 2 (two) times daily as needed.    . diphenhydrAMINE (BENADRYL) 25 mg capsule Take 1 capsule (25 mg total) by mouth at bedtime as needed for sleep. 30 capsule 0  . feeding supplement, ENSURE ENLIVE, (ENSURE ENLIVE) LIQD Take 237 mLs by mouth 3 (three) times daily between meals. 90 Bottle 0  . finasteride (PROSCAR) 5 MG tablet Take 1 tablet (5 mg total) by mouth daily. 90 tablet 3  . furosemide (LASIX) 20 MG tablet Take 20 mg by mouth every other day. Take 1-2 tablets daily.    . hydroxypropyl methylcellulose (ISOPTO TEARS) 2.5 % ophthalmic solution Place 1 drop into both eyes as needed.     . megestrol (MEGACE) 400 MG/10ML suspension Take 5 mLs (200 mg total) by mouth daily. 240 mL 0  . metoprolol tartrate (LOPRESSOR) 25 MG tablet Take 12.5 mg by mouth 2 (two) times daily.     . Multiple Vitamin (MULTIVITAMIN) tablet Take 1 tablet by mouth daily.      . ondansetron (ZOFRAN) 4 MG tablet Take 1 tablet (4 mg total) by mouth every 6 (six) hours as needed for nausea. 20 tablet 0  . oxybutynin (DITROPAN-XL) 5 MG 24 hr tablet Take 1 tablet (5 mg total) by mouth daily. 90 tablet 3  . oxyCODONE-acetaminophen (ROXICET) 5-325 MG tablet Take 1-2 tablets by mouth every 6 (six) hours as needed. 15 tablet 0  . pantoprazole (PROTONIX) 40 MG tablet TAKE 1 TABLET BY MOUTH TWICE A DAY 60 tablet 1  . pantoprazole (PROTONIX) 40 MG tablet TAKE 1 TABLET BY MOUTH TWICE A DAY 60 tablet 1  . predniSONE (DELTASONE) 20 MG tablet as directed.     . predniSONE (DELTASONE) 5 MG tablet Take 5 mg by mouth daily.      . simethicone (MYLICON) 80 MG chewable tablet Chew 1 tablet (80 mg total) by mouth every 6 (six) hours as needed for flatulence. 120 tablet 0  . sucralfate (CARAFATE) 1 g tablet Take 1 tablet (1 g total) by mouth 4 (four) times daily. Resume taking after one week- once finished taking oral levaquine. ( to avoid interaction.) 40 tablet 3  . tacrolimus (PROGRAF) 1 MG capsule Take 3 mg by mouth 2 (two) times daily. Reported on 08/16/2015    . tamsulosin (FLOMAX)  0.4 MG CAPS capsule Take 1 capsule (0.4 mg total) by mouth daily. 90 capsule 3  . traZODone (DESYREL) 50 MG tablet 1-2 TABS AS NEEDED FOR SLEEP  3  . lenalidomide (REVLIMID) 15 MG capsule Take 1 capsule (15 mg total) by mouth every other day. for 21 days then off 7 days (Patient not taking: Reported on 03/19/2017) 10 capsule 0   No current facility-administered medications for this visit.     Review of Systems:  GENERAL:  Feels "ok".  No fevers or sweats.  Weight down 4 pounds.   PERFORMANCE STATUS (ECOG):  1 HEENT:  No visual changes, sore throat, mouth sores or tenderness. Lungs: No shortness of breath or cough.  No hemoptysis. Cardiac:  No chest pain, palpitations, orthopnea, or PND. GI:  Appetite decreased, improved on Megace.  No nausea, vomiting, diarrhea, constipation, melena or hematochezia. GU:  Enlarged prostate.  No urgency, frequency, dysuria, or hematuria.  Left inguinal hernia.  Right inguinal pain, improved. Musculoskeletal: No back pain.  No joint pain.  No muscle tenderness. Extremities:  No pain or swelling. Skin:  Nail changes.  No rashes or skin changes. Neuro:  Forgetful.  No headache, numbness or weakness, balance or coordination issues. Endocrine:  No diabetes, thyroid issues, hot flashes or night sweats. Psych:  No mood changes, depression or anxiety. Pain:  Pain right inguinal area.   Review of systems:  All other systems reviewed and found to be negative.  Physical Exam: Blood pressure 135/71, temperature  (!) 95.5 F (35.3 C), weight 117 lb 5 oz (53.2 kg). GENERAL:  Thin elderly gentleman sitting comfortably in the exam room in a wheelchair in no acute distress.  MENTAL STATUS:  Alert and oriented to person, place and time. HEAD:  Lu Duffel.  Temporal wasting.  Normocephalic, atraumatic, face symmetric, no Cushingoid features. EYES:  Glasses.  Brown eyes.  Pupils equal round and reactive to light and accomodation.  No conjunctivitis or scleral icterus. ENT:  Oropharynx clear without lesion.  Edentulous.  Tongue normal. Mucous membranes dry.  RESPIRATORY:  Clear to auscultation without rales, wheezes or rhonchi. CARDIOVASCULAR:  Regular rate and rhythm without murmur, rub or gallop. ABDOMEN:  Soft, non-tender, with active bowel sounds, and no hepatosplenomegaly.  No masses.  Palpable transplanted kidney.  GU:  Minimally tender right groin. SKIN:  No rashes, ulcers or lesions. EXTREMITIES:  No edema, no skin discoloration or tenderness.  Right upper extremity with dilated vascular s/p AV fistula (chronic).  No palpable cords. NEUROLOGICAL: Unremarkable. PSYCH:  Appropriate.    Imaging studies:  04/28/2016 PET scan:  Bulky intensely hypermetabolic periaortic upper abdominal and mesenteric adenopathy concerning for high-grade lymphoma.  There was hypermetabolic liver metastasis.  There was hpermetabolic lesion involving the small bowel of the upper pelvis.  There were multiple sites of hypermetabolic skeletal metastasis. There was moderate volume right pneumothorax. 08/12/2016 PET scan:  Interval response to therapy. There has been significant decrease in extent of hypermetabolic tumor within the neck, chest, abdomen and pelvis as well as the axial and appendicular skeleton.  There was residual enlarged and hypermetabolic small bowel mesenteric and periaortic lymph nodes. There was a persistent hypermetabolic focus of increased uptake within the spleen.  There was resolution of previous multifocal  hypermetabolic lesions within the liver. 10/10/2016 PET scan:  Slight worsening compared to the prior study. Several lesions were slightly more hypermetabolic and there appeared to be a newly enlarged and hypermetabolic mesenteric node adjacent to one of the previous mesenteric lymph  nodes. However, there are no new hypermetabolic lesions in the neck, chest, or skeleton.  The small hypermetabolic peripheral lesion of the right kidney upper pole is slightly less hypermetabolic. 51/07/5850 PET scan:  Marked progression of disease as evidenced by progressive bulky mesenteric adenopathy, new low left internal jugular/left juxta diaphragmatic/abdominal retroperitoneal adenopathy, new pulmonary nodules, new hepatic and splenic lesions and new peritoneal nodules, all of which are hypermetabolic 77/82/4235 CT abdomen and pelvis: Slight increased size of left lower lobe pulmonary metastatic nodule. Re-demonstrated hypodense liver masses and splenic masses, consistent with metastatic disease, suspect increased in size compared to prior PET-CT. Bulky mesenteric and retroperitoneal masses consistent with metastatic disease, also increased in size compared to recent PET-CT. Atrophic native kidneys. Transplant kidney in the right lower quadrant. Negative for hydronephrosis. 03/08/2017:  Scrotal ultrasound revealed soft tissue structure in the right scrotum separate from the epididymis and testicle. There was internal blood flow. The appearance could represent bowel herniated into the right side of the scrotum but there  was no peristalsis and there was no definitive hernia seen on a recent CT. 03/08/2017:  Abdomen CT revealed stable lingular and left lower lobe pulmonary metastatic nodules to the extent included.  There was slight interval increase in size of several hepatic metastatic lesions (4.3 cm vs 3.6 cm; 5.2 cm vs 4.9 cm).  There was equivocal overall change with reference to retroperitoneal and mesenteric  lymphadenopathy, some areas appear to have increased in size such as the retrocaval lymph node mass (4.1 x 6.4 cm vs 6 x 3.1 cm), others remaining stable such as the left para-aortic and pelvic lymph node index masses and others appearing slightly smaller in the lower abdominal mesentery.   There was some mild nonspecific scrotal skin thickening.  There was no bowel herniation or definite intratesticular mass. Tubular densities were seen within the scrotum bilaterally which were likely related to the spermatic cord and vessels.   There was no significant hydrocele.   No visits with results within 3 Day(s) from this visit.  Latest known visit with results is:  Appointment on 03/10/2017  Component Date Value Ref Range Status  . WBC 03/10/2017 26.0* 3.8 - 10.6 K/uL Final  . RBC 03/10/2017 3.36* 4.40 - 5.90 MIL/uL Final  . Hemoglobin 03/10/2017 9.3* 13.0 - 18.0 g/dL Final  . HCT 03/10/2017 27.2* 40.0 - 52.0 % Final  . MCV 03/10/2017 80.9  80.0 - 100.0 fL Final  . MCH 03/10/2017 27.8  26.0 - 34.0 pg Final  . MCHC 03/10/2017 34.4  32.0 - 36.0 g/dL Final  . RDW 03/10/2017 21.0* 11.5 - 14.5 % Final  . Platelets 03/10/2017 129* 150 - 440 K/uL Final  . Neutrophils Relative % 03/10/2017 92  % Final  . Neutro Abs 03/10/2017 24.1* 1.4 - 6.5 K/uL Final  . Lymphocytes Relative 03/10/2017 2  % Final  . Lymphs Abs 03/10/2017 0.6* 1.0 - 3.6 K/uL Final  . Monocytes Relative 03/10/2017 5  % Final  . Monocytes Absolute 03/10/2017 1.2* 0.2 - 1.0 K/uL Final  . Eosinophils Relative 03/10/2017 1  % Final  . Eosinophils Absolute 03/10/2017 0.1  0 - 0.7 K/uL Final  . Basophils Relative 03/10/2017 0  % Final  . Basophils Absolute 03/10/2017 0.0  0 - 0.1 K/uL Final  . Sodium 03/10/2017 131* 135 - 145 mmol/L Final  . Potassium 03/10/2017 5.1  3.5 - 5.1 mmol/L Final  . Chloride 03/10/2017 102  101 - 111 mmol/L Final  . CO2 03/10/2017 17* 22 -  32 mmol/L Final  . Glucose, Bld 03/10/2017 64* 65 - 99 mg/dL Final  . BUN  03/10/2017 30* 6 - 20 mg/dL Final  . Creatinine, Ser 03/10/2017 1.80* 0.61 - 1.24 mg/dL Final  . Calcium 03/10/2017 9.9  8.9 - 10.3 mg/dL Final  . GFR calc non Af Amer 03/10/2017 35* >60 mL/min Final  . GFR calc Af Amer 03/10/2017 40* >60 mL/min Final   Comment: (NOTE) The eGFR has been calculated using the CKD EPI equation. This calculation has not been validated in all clinical situations. eGFR's persistently <60 mL/min signify possible Chronic Kidney Disease.   . Anion gap 03/10/2017 12  5 - 15 Final  . LDH 03/10/2017 501* 98 - 192 U/L Final  . Uric Acid, Serum 03/10/2017 8.5* 4.4 - 7.6 mg/dL Final    Assessment:  SHAW DOBEK is a 76 y.o. male s/p renal transplant (2007) with a post-transplant lymphoproliferative disorder, stage IV diffuse large B cell lymphoma.  He presented with a 2-3 month history of progressive back pain superimposed on chronic back pain.    PET scan on 04/28/2016 revealed bulky intensely hypermetabolic periaortic upper abdominal and mesenteric adenopathy concerning for high-grade lymphoma.  There was hypermetabolic liver metastasis.  There was hpermetabolic lesion involving the small bowel of the upper pelvis.  There were multiple sites of hypermetabolic skeletal metastasis. There was moderate volume right pneumothorax.  CT guided retroperitoneal node biopsy on 04/30/2016 revealed diffuse large B cell lymphoma.  Hepatitis B and C testing were negative on 05/06/2016 and 02/03/2017.  Echo on 05/06/2016 revealed an EF of 55-60%.  Echo on 09/01/2016 revealed an EF of 60-65%.  Bone marrow aspirate and biopsy on 05/08/2016 revealed multifocal marrow involvement by diffuse large B-cell lymphoma. There was variably cellular marrow for age (50% - 90%) with a patchy predominantly nodular large B-cell infiltrate, overall estimated to account for 20% of the core biopsy. There was adequate residual trilineage hematopoiesis with mild nonspecific dyserythropoiesis. There was patchy  mild increase in reticulin. Storage iron was present. The immunohistochemical staining pattern of the B-cell infiltrate (CD10 +/-, BCL 6+, BCL-2 +) suggested possible large cell transformation of follicular lymphoma.  Flow cytometry revealed no significant immunophenotypic abnormalities or evidence of B-cell lymphoma.  He has bone metastasis and hypercalcemia.  Calcium was 11.2 (ionized 7.3) on 05/13/2016.  He received Zometa on 05/13/2016.  He began [place] on 06/09/2016 (last 08/05/2016).  Work-up on 04/15/2016 revealed the following normal studies: ferritin (343), iron saturation (6%), TIBC (241; low), B12 (525), folate (27).  Reticulocyte count was 2%.  LDH was 409.  Uric acid was 7.7 (4.4 - 7.6).  He was admitted at [facility] from 04/28/2016 - 04/30/2016 with a moderate volume right sided pneumothorax.  His pneumothorax improved spontaneously. Plain films of the right femur revealed the lucent bone lesion in the right femoral neck  was poorly characterized.  There was no evidence for an acute fracture.  PTH was 106 (high) 04/30/2016 with a calcium of 11.2.  Etiology was c/w primary hyperparathyroidism.  PTH-related polypeptide was < 1.1 on 04/30/2016.  He has a history of GI bleeding in 08/2014.  He underwent tagged RBC scan which revealed an active bleed in the hepatic flexure.  He was embolized in vascular interventional radiology.  He was admitted to [facility] from 07/08/2016 - 07/13/2016 with GI bleeding.  EGD on 07/09/2016 revealed multiple non-bleeding duodenal ulcer as well as duodenitis. Tagged RBC scan on 07/11/2016 revealed active GI bleed in the lateral right mid  abdomen coursing through multiple curvilinear bowel loops in the right mid abdomen favoring a small bowel source of bleeding.  He received 5 units of PRBCs, 2 units of pheresed platelets, and vitamin K from 07/07/2016 0 07/13/2016.  He was transferred to [facility] from 07/13/2016 - 07/16/2016.  [facility] performed a colonoscopy and push enteroscopy  which showed several diverticula and old blood but no source of bleeding. Source of bleed was presumed to be diverticular, although it was possible a small bowel site was not visualized on endoscopy.  He has a history of renal failure s/p renal transplant.  He is tacrolimus (Prograf), and steroids  Creatinine has ranged between 1.43 - 1.93 in the past 6 months. Mycophenolate (MMF) was discontinued at diagnosis.  Prograf level was 5.5 (3.0-8.0) on 05/08/2016, 2.2 on 08/13/2016, 2.3 in 01/01/2017.  He received 6 cycles of mini-RCHOP (05/09/2016 - 09/05/2016).  Cycle #1 was complicated by fever and neutropenia.  Cycle #3 was complicated by a GI bleed.  CSF on 06/19/2016 revealed 7 WBCs (4% segs, 79% lymphs, and 17% monocytes).  He has undergone LP with IT MTX x 4 (07/24/2016, 08/14/2016, 09/04/2016, and 10/02/2016).  Cytology was negative on 07/24/2016 and 09/04/2016.  PET scan on 10/10/2016 revealed slight worsening compared to the prior study. Several lesions were slightly more hypermetabolic and there appeared to be a newly enlarged and hypermetabolic mesenteric node adjacent to one of the previous mesenteric lymph nodes. However, there are no new hypermetabolic lesions in the neck, chest, or skeleton.  The small hypermetabolic peripheral lesion of the right kidney upper pole is slightly less hypermetabolic.  He was lost to follow-up.  PET scan on 01/14/2017 revealed marked progression of disease as evidenced by progressive bulky mesenteric adenopathy, new low left internal jugular/left juxta diaphragmatic/abdominal retroperitoneal adenopathy, new pulmonary nodules, new hepatic and splenic lesions and new peritoneal nodules, all of which are hypermetabolic.  LDH was 480 on 02/06/2017.  He is s/p 2 cycles of gemcitabine and oxaliplatin + obinutuzumab (02/04/2017 - 02/24/2017) with Neulasta support.   Abdomen CT on 03/08/2017 revealed stable lingular and left lower lobe pulmonary metastatic nodules.   There was slight interval increase in size of several hepatic metastatic lesions (4.3 cm vs 3.6 cm; 5.2 cm vs 4.9 cm).  There was equivocal overall change with reference to retroperitoneal and mesenteric lymphadenopathy, some areas appear to have increased in size such as the retrocaval lymph node mass (4.1 x 6.4 cm vs 6 x 3.1 cm), others remaining stable such as the left para-aortic and pelvic lymph node index masses and others appearing slightly smaller in the lower abdominal mesentery.   There was some mild nonspecific scrotal skin thickening.  Tubular densities within the scrotum bilaterally were likely related to the spermatic cord and vessels.   There was no significant hydrocele.  Symptomatically, he feels "ok".  He has continued RIGHT groin pain; improved. Patient losing weight despite Megace; down 4 pounds. He denies any fevers or sweats.  Exam is stable.   Plan:  1.  Labs rescheduled for tomorrow morning (03/20/2017): CBC with diff, BMP, LDH, uric acid. 2.  Discuss chemotherapy plan.  Rituxan every 28 days.  Revlimid day 1-21 with 7 days off.  Revlimid dosing is 15 mg QOD secondary to renal function.  Discuss watching renal function and counts closely as he may require dose adjustment.  Side effects of treatment reviewed.  Patient consented to treatment. 3.  Oral chemo education today with Clearnce Sorrel, PharmD prior to initiation of  Revlimid.   4.  Discuss weight loss. Encouraged patient to increase protein and calorie intake. Patient has been on appetite stimulant for less than a week. He will continue on Megace 200 mg daily in the interim. We will recheck weight next week, with plans to increase the Megace dose if he continues to lose weight.  5.  Rx: Percocet 5/354m q6 hours PRN (disp #30). 6.  RTC on 03/20/2017 for labs (CBC with diff, BMP, LDH, uric acid) and cycle # 1 Rituxan  7.  RTC on 03/27/2017 for MD assessment and labs (CBC with diff, BMP, uric acid) 8.  RTC on 04/17/2017 for MD  assessment and labs (CBC with diff, CMP, LDH, uric acid) and cycle #2 Rituxan. 9.  Patient wants to discuss influenza vaccination. Patient starts new chemotherapy tomorrow. BHonor Lohreviewed with patient and spouse that we are unsure as to the side effects that patient may experience. Advised to hold on vaccination at this time. We will revisit next week during his clinic visit.    BHonor Loh NP 03/19/2017,11:42 AM    I saw and evaluated the patient, participating in the key portions of the service and reviewing pertinent diagnostic studies and records.  I reviewed the nurse practitioner's note and agree with the findings and the plan.  The assessment and plan were discussed with the patient.  Multiple questions were asked by the patient and answered.   MLequita Asal MD 03/19/2017,5:11 PM

## 2017-03-20 ENCOUNTER — Other Ambulatory Visit: Payer: Self-pay | Admitting: Hematology and Oncology

## 2017-03-20 ENCOUNTER — Other Ambulatory Visit: Payer: Self-pay | Admitting: Urgent Care

## 2017-03-20 ENCOUNTER — Inpatient Hospital Stay: Payer: Medicare HMO

## 2017-03-20 ENCOUNTER — Telehealth: Payer: Self-pay | Admitting: Urgent Care

## 2017-03-20 DIAGNOSIS — C8330 Diffuse large B-cell lymphoma, unspecified site: Secondary | ICD-10-CM

## 2017-03-20 DIAGNOSIS — C7951 Secondary malignant neoplasm of bone: Secondary | ICD-10-CM

## 2017-03-20 DIAGNOSIS — Z5112 Encounter for antineoplastic immunotherapy: Secondary | ICD-10-CM | POA: Diagnosis not present

## 2017-03-20 DIAGNOSIS — C8338 Diffuse large B-cell lymphoma, lymph nodes of multiple sites: Secondary | ICD-10-CM

## 2017-03-20 DIAGNOSIS — C8339 Diffuse large B-cell lymphoma, extranodal and solid organ sites: Secondary | ICD-10-CM

## 2017-03-20 DIAGNOSIS — D47Z1 Post-transplant lymphoproliferative disorder (PTLD): Secondary | ICD-10-CM

## 2017-03-20 DIAGNOSIS — T8699 Other complications of unspecified transplanted organ and tissue: Secondary | ICD-10-CM

## 2017-03-20 LAB — LACTATE DEHYDROGENASE: LDH: 471 U/L — ABNORMAL HIGH (ref 98–192)

## 2017-03-20 LAB — CBC WITH DIFFERENTIAL/PLATELET
Basophils Absolute: 0 10*3/uL (ref 0–0.1)
Basophils Relative: 0 %
Eosinophils Absolute: 0 10*3/uL (ref 0–0.7)
Eosinophils Relative: 0 %
HCT: 25 % — ABNORMAL LOW (ref 40.0–52.0)
Hemoglobin: 8.8 g/dL — ABNORMAL LOW (ref 13.0–18.0)
Lymphocytes Relative: 5 %
Lymphs Abs: 0.5 10*3/uL — ABNORMAL LOW (ref 1.0–3.6)
MCH: 28.4 pg (ref 26.0–34.0)
MCHC: 35.3 g/dL (ref 32.0–36.0)
MCV: 80.5 fL (ref 80.0–100.0)
Monocytes Absolute: 0.9 10*3/uL (ref 0.2–1.0)
Monocytes Relative: 9 %
Neutro Abs: 8.4 10*3/uL — ABNORMAL HIGH (ref 1.4–6.5)
Neutrophils Relative %: 86 %
Platelets: 163 10*3/uL (ref 150–440)
RBC: 3.11 MIL/uL — ABNORMAL LOW (ref 4.40–5.90)
RDW: 21.7 % — ABNORMAL HIGH (ref 11.5–14.5)
WBC: 9.8 10*3/uL (ref 3.8–10.6)

## 2017-03-20 LAB — COMPREHENSIVE METABOLIC PANEL
ALT: 8 U/L — ABNORMAL LOW (ref 17–63)
AST: 26 U/L (ref 15–41)
Albumin: 3.3 g/dL — ABNORMAL LOW (ref 3.5–5.0)
Alkaline Phosphatase: 99 U/L (ref 38–126)
Anion gap: 6 (ref 5–15)
BUN: 37 mg/dL — ABNORMAL HIGH (ref 6–20)
CO2: 23 mmol/L (ref 22–32)
Calcium: 9.6 mg/dL (ref 8.9–10.3)
Chloride: 107 mmol/L (ref 101–111)
Creatinine, Ser: 2 mg/dL — ABNORMAL HIGH (ref 0.61–1.24)
GFR calc Af Amer: 36 mL/min — ABNORMAL LOW (ref >60)
GFR calc non Af Amer: 31 mL/min — ABNORMAL LOW (ref >60)
Glucose, Bld: 110 mg/dL — ABNORMAL HIGH (ref 65–99)
Potassium: 4.9 mmol/L (ref 3.5–5.1)
Sodium: 136 mmol/L (ref 135–145)
Total Bilirubin: 0.6 mg/dL (ref 0.3–1.2)
Total Protein: 6.1 g/dL — ABNORMAL LOW (ref 6.5–8.1)

## 2017-03-20 LAB — URIC ACID: Uric Acid, Serum: 9.8 mg/dL — ABNORMAL HIGH (ref 4.4–7.6)

## 2017-03-20 MED ORDER — SODIUM CHLORIDE 0.9 % IV SOLN
Freq: Once | INTRAVENOUS | Status: AC
  Administered 2017-03-20: 13:00:00 via INTRAVENOUS
  Filled 2017-03-20: qty 1000

## 2017-03-20 MED ORDER — ALLOPURINOL 100 MG PO TABS: 200.0000 mg | ORAL_TABLET | Freq: Every day | ORAL | 2 refills | Status: AC

## 2017-03-20 MED ORDER — SODIUM CHLORIDE 0.9% FLUSH
10.0000 mL | INTRAVENOUS | Status: DC | PRN
Start: 2017-03-20 — End: 2017-09-03
  Filled 2017-03-20: qty 10

## 2017-03-20 MED ORDER — DIPHENHYDRAMINE HCL 25 MG PO CAPS
50.0000 mg | ORAL_CAPSULE | Freq: Once | ORAL | Status: AC
  Administered 2017-03-20: 50 mg via ORAL
  Filled 2017-03-20: qty 2

## 2017-03-20 MED ORDER — HEPARIN SOD (PORK) LOCK FLUSH 100 UNIT/ML IV SOLN
500.0000 [IU] | Freq: Once | INTRAVENOUS | Status: AC | PRN
  Administered 2017-03-20: 500 [IU]
  Filled 2017-03-20: qty 5

## 2017-03-20 MED ORDER — SODIUM CHLORIDE 0.9 % IV SOLN
Freq: Once | INTRAVENOUS | Status: AC
  Administered 2017-03-20: 10:00:00 via INTRAVENOUS
  Filled 2017-03-20: qty 1000

## 2017-03-20 MED ORDER — RITUXIMAB CHEMO INJECTION 500 MG/50ML
375.0000 mg/m2 | Freq: Once | INTRAVENOUS | Status: AC
  Administered 2017-03-20: 600 mg via INTRAVENOUS
  Filled 2017-03-20: qty 50

## 2017-03-20 MED ORDER — ACETAMINOPHEN 325 MG PO TABS
650.0000 mg | ORAL_TABLET | Freq: Once | ORAL | Status: AC
  Administered 2017-03-20: 650 mg via ORAL
  Filled 2017-03-20: qty 2

## 2017-03-20 NOTE — Telephone Encounter (Signed)
Patient started Rituxin and Revlimid today. Labs reviewed. Uric acid noted to be elevated at 9.8 despite daily treatment with allopurinol 100 mg daily. Creatinine also elevated at 2.00 (previously 1.80). Dr. Mike Gip is aware, and would like for patients allopurinol dose to be increased to 200 mg daily. Patient is in the infusion center at this time receiving his chemotherapy treatment. This NP to the infusion center to speak with the patient. I reviewed the lab results with the patient and made him aware of his need to increase his fluid intake. Additionally, patient will receive 500 cc normal saline bolus following his chemotherapy today. Patient encouraged to increase his oral fluid intake at home. We discussed his increased uric acid level. Patient had questions about things that he may be eating that could potentially cause an increase in his uric acid levels. Patient denies high purine diet. I reviewed with Mr. Westling that his elevated uric acid level secondary to tumor lysis, and that the allopurinol was necessary to control this level. New prescription for Allopurinol and has been sent to the patient's pharmacy. Patient will need to return on Monday, 03/23/2017 for repeat labs (CBC with differential, BMP, uric acid). Patient verbalizes understanding and consent with plan of care as it stands at this time. Mr. Ursin has a planned RTC visit on 03/27/2017 to see Dr. Mike Gip.

## 2017-03-20 NOTE — Telephone Encounter (Signed)
Oral Chemotherapy Pharmacist Encounter  Provided patient with medication calendar. Prior to his infusion appointment. reinforced that he is to start Revlimid today and continue every other day for 21 days.   Darl Pikes, PharmD, BCPS Hematology/Oncology Clinical Pharmacist [facility] [phone]  03/20/2017 9:48 AM

## 2017-03-23 ENCOUNTER — Other Ambulatory Visit: Payer: Self-pay | Admitting: Urgent Care

## 2017-03-23 DIAGNOSIS — C8330 Diffuse large B-cell lymphoma, unspecified site: Secondary | ICD-10-CM

## 2017-03-25 ENCOUNTER — Telehealth: Payer: Self-pay | Admitting: *Deleted

## 2017-03-25 MED FILL — PROGRAF/1MG/CAP: PROGRAF/1MG/CAP | 30 days supply | Qty: 180 | Fill #2

## 2017-03-25 NOTE — Telephone Encounter (Signed)
Attempted to call patient 7-8 times today to determine why he did not come for his labwork on 03-23-17.  Unable to get an answer.  No answering machine and voice mailbox has not been initiated.  Will try again tomorrow 03-26-17.

## 2017-03-27 ENCOUNTER — Inpatient Hospital Stay (HOSPITAL_BASED_OUTPATIENT_CLINIC_OR_DEPARTMENT_OTHER): Payer: Medicare HMO | Admitting: Hematology and Oncology

## 2017-03-27 ENCOUNTER — Other Ambulatory Visit: Payer: Self-pay | Admitting: *Deleted

## 2017-03-27 ENCOUNTER — Inpatient Hospital Stay: Payer: Medicare HMO

## 2017-03-27 ENCOUNTER — Inpatient Hospital Stay: Payer: Medicare HMO | Attending: Hematology and Oncology

## 2017-03-27 VITALS — BP 125/67 | HR 52 | Temp 98.2°F | Wt 118.0 lb

## 2017-03-27 DIAGNOSIS — D709 Neutropenia, unspecified: Secondary | ICD-10-CM | POA: Diagnosis not present

## 2017-03-27 DIAGNOSIS — N186 End stage renal disease: Secondary | ICD-10-CM | POA: Insufficient documentation

## 2017-03-27 DIAGNOSIS — Z8619 Personal history of other infectious and parasitic diseases: Secondary | ICD-10-CM | POA: Insufficient documentation

## 2017-03-27 DIAGNOSIS — C8330 Diffuse large B-cell lymphoma, unspecified site: Secondary | ICD-10-CM

## 2017-03-27 DIAGNOSIS — Z87891 Personal history of nicotine dependence: Secondary | ICD-10-CM

## 2017-03-27 DIAGNOSIS — E785 Hyperlipidemia, unspecified: Secondary | ICD-10-CM | POA: Insufficient documentation

## 2017-03-27 DIAGNOSIS — T8610 Unspecified complication of kidney transplant: Secondary | ICD-10-CM

## 2017-03-27 DIAGNOSIS — K219 Gastro-esophageal reflux disease without esophagitis: Secondary | ICD-10-CM | POA: Diagnosis not present

## 2017-03-27 DIAGNOSIS — I129 Hypertensive chronic kidney disease with stage 1 through stage 4 chronic kidney disease, or unspecified chronic kidney disease: Secondary | ICD-10-CM

## 2017-03-27 DIAGNOSIS — M7989 Other specified soft tissue disorders: Secondary | ICD-10-CM | POA: Insufficient documentation

## 2017-03-27 DIAGNOSIS — Z7901 Long term (current) use of anticoagulants: Secondary | ICD-10-CM | POA: Insufficient documentation

## 2017-03-27 DIAGNOSIS — E790 Hyperuricemia without signs of inflammatory arthritis and tophaceous disease: Secondary | ICD-10-CM

## 2017-03-27 DIAGNOSIS — N400 Enlarged prostate without lower urinary tract symptoms: Secondary | ICD-10-CM | POA: Diagnosis not present

## 2017-03-27 DIAGNOSIS — N529 Male erectile dysfunction, unspecified: Secondary | ICD-10-CM

## 2017-03-27 DIAGNOSIS — M1990 Unspecified osteoarthritis, unspecified site: Secondary | ICD-10-CM

## 2017-03-27 DIAGNOSIS — Z940 Kidney transplant status: Secondary | ICD-10-CM

## 2017-03-27 DIAGNOSIS — D649 Anemia, unspecified: Secondary | ICD-10-CM | POA: Insufficient documentation

## 2017-03-27 DIAGNOSIS — Z8719 Personal history of other diseases of the digestive system: Secondary | ICD-10-CM | POA: Diagnosis not present

## 2017-03-27 DIAGNOSIS — R634 Abnormal weight loss: Secondary | ICD-10-CM

## 2017-03-27 DIAGNOSIS — Z23 Encounter for immunization: Secondary | ICD-10-CM

## 2017-03-27 DIAGNOSIS — M109 Gout, unspecified: Secondary | ICD-10-CM | POA: Insufficient documentation

## 2017-03-27 DIAGNOSIS — L850 Acquired ichthyosis: Secondary | ICD-10-CM | POA: Insufficient documentation

## 2017-03-27 DIAGNOSIS — R197 Diarrhea, unspecified: Secondary | ICD-10-CM | POA: Diagnosis not present

## 2017-03-27 DIAGNOSIS — Z801 Family history of malignant neoplasm of trachea, bronchus and lung: Secondary | ICD-10-CM | POA: Insufficient documentation

## 2017-03-27 DIAGNOSIS — D696 Thrombocytopenia, unspecified: Secondary | ICD-10-CM | POA: Diagnosis not present

## 2017-03-27 DIAGNOSIS — D47Z1 Post-transplant lymphoproliferative disorder (PTLD): Secondary | ICD-10-CM | POA: Diagnosis not present

## 2017-03-27 DIAGNOSIS — I272 Pulmonary hypertension, unspecified: Secondary | ICD-10-CM

## 2017-03-27 LAB — BASIC METABOLIC PANEL
Anion gap: 8 (ref 5–15)
BUN: 20 mg/dL (ref 6–20)
CO2: 21 mmol/L — ABNORMAL LOW (ref 22–32)
Calcium: 9.8 mg/dL (ref 8.9–10.3)
Chloride: 107 mmol/L (ref 101–111)
Creatinine, Ser: 1.16 mg/dL (ref 0.61–1.24)
GFR calc Af Amer: >60 mL/min (ref >60)
GFR calc non Af Amer: 59 mL/min — ABNORMAL LOW (ref >60)
Glucose, Bld: 87 mg/dL (ref 65–99)
Potassium: 4.8 mmol/L (ref 3.5–5.1)
Sodium: 136 mmol/L (ref 135–145)

## 2017-03-27 LAB — CBC WITH DIFFERENTIAL/PLATELET
Basophils Absolute: 0 10*3/uL (ref 0–0.1)
Basophils Relative: 0 %
Eosinophils Absolute: 0.1 10*3/uL (ref 0–0.7)
Eosinophils Relative: 3 %
HCT: 23.8 % — ABNORMAL LOW (ref 40.0–52.0)
Hemoglobin: 8.1 g/dL — ABNORMAL LOW (ref 13.0–18.0)
Lymphocytes Relative: 12 %
Lymphs Abs: 0.5 10*3/uL — ABNORMAL LOW (ref 1.0–3.6)
MCH: 28.1 pg (ref 26.0–34.0)
MCHC: 34.2 g/dL (ref 32.0–36.0)
MCV: 82 fL (ref 80.0–100.0)
Monocytes Absolute: 0.4 10*3/uL (ref 0.2–1.0)
Monocytes Relative: 10 %
Neutro Abs: 3 10*3/uL (ref 1.4–6.5)
Neutrophils Relative %: 75 %
Platelets: 160 10*3/uL (ref 150–440)
RBC: 2.9 MIL/uL — ABNORMAL LOW (ref 4.40–5.90)
RDW: 22.3 % — ABNORMAL HIGH (ref 11.5–14.5)
WBC: 4 10*3/uL (ref 3.8–10.6)

## 2017-03-27 LAB — URIC ACID: Uric Acid, Serum: 5.1 mg/dL (ref 4.4–7.6)

## 2017-03-27 MED ORDER — INFLUENZA VAC SPLIT QUAD 0.5 ML IM SUSY
0.5000 mL | PREFILLED_SYRINGE | Freq: Once | INTRAMUSCULAR | Status: AC
  Administered 2017-03-27: 0.5 mL via INTRAMUSCULAR

## 2017-03-27 NOTE — Progress Notes (Signed)
Pt in today for follow up.  Reports improved appetite since starting megace.  States "I want to eat everything all the time".  Has had some diarrhea but has improved in the past 2 days.

## 2017-03-27 NOTE — Progress Notes (Signed)
[facility] day:  03/27/2017   Chief Complaint: Matthew Brown is a 76 y.o. male with post-transplant lymphoproliferative disorder, stage IVBE diffuse large B cell lymphoma, who is seen for assessment on day 9 of cycle #1 Rituxan and Revlimid.  HPI:  The patient was last seen in the medical oncology clinic on 03/19/2017.  At that time, he felt "ok".  He had mild RIGHT groin pain.  He was losing weight despite Megace. He denies any fevers or sweats.  Exam was stable.   He received Rituxan on 03/20/2017.  Creatinine was 2.0.  Uric acid was 9.8.  Allopurinol was increased to 200 mg/day.  CBC revealed a hematocrit of 25.0, hemoglobin 8.8, MCV 80.5, platelets 163,000, and WBC 9800 with an ANC of 8400.    Patient started Revlimid 15 mg QOD on 03/20/2017.  Symptomatically, he has had bilateral ankle swelling and diarrhea since her began his new treatments. Diarrhea on occurs on the days that he takes his Revlimid.  He denies fevers, however he has had the sensation of being hot. He states, "it feels like somebody plugged me into a heater". Pain in his RIGHT groin has resolved fully.  Patient is eating better. He continues to use the prescribe appetite stimulant.    Past Medical History:  Diagnosis Date  . Benign prostatic hypertrophy   . Chronic headache 10/19/2015  . ED (erectile dysfunction)   . End stage renal disease ([place])   . Essential hypertension   . GERD (gastroesophageal reflux disease)   . GIB (gastrointestinal bleeding)    a. 02/3789 s/p R colic artery embolization;  b. 02/2015 EGD: duod ulcerative mass->Bx notable for coagulative necrosis - ? ischemia vs thrombosis-->coumadin d/c'd.  . Gout   . Hearing loss   . Hemorrhoids   . Hyperlipidemia   . Lymphoma ([place])   . Lymphoma ([place]) 2017  . Multiple thyroid nodules 06/06/2016   Noted on carotid US; dedicated US to be ordered by staff  . Osteoarthrosis, unspecified whether generalized or  localized, lower leg   . Persistent atrial fibrillation ([place])    a. CHA2DS2VASc = 3-->coumadin d/c'd 02/2015 2/2 recurrent GIB.  [name] Prostatitis   . Pulmonary hypertension ([place])    a. 10/2014 Echo: EF 60-65%, mild to mod MR, mildly dil LA, nl RV, PASP 34mHg.  .Marland KitchenRenal transplant recipient   . Ulcers of both great toes ([facility]     Past Surgical History:  Procedure Laterality Date  . AV FISTULA PLACEMENT  1998  . BACK SURGERY    . ESOPHAGOGASTRODUODENOSCOPY  03/13/15   severe esophagitis, ulcerated mass  . ESOPHAGOGASTRODUODENOSCOPY (EGD) WITH PROPOFOL N/A 07/09/2016   Procedure: ESOPHAGOGASTRODUODENOSCOPY (EGD) WITH PROPOFOL;  Surgeon: KJonathon Bellows MD;  Location: [facility] ENDOSCOPY;  Service: Endoscopy;  Laterality: N/A;  . HERNIA REPAIR  1974  . KIDNEY TRANSPLANT  2006  . PERIPHERAL VASCULAR CATHETERIZATION N/A 05/07/2016   Procedure: PGlori LuisCath Insertion;  Surgeon: JAlgernon Huxley MD;  [facility];  Service: Cardiovascular;  Laterality: N/A;  . PROSTATE ABLATION    . STOMACH SURGERY     blood vessel burst  . THROAT SURGERY    . TOTAL KNEE ARTHROPLASTY      Family History  Problem Relation Age of Onset  . Cancer Mother        throat  . Diabetes Brother   . Heart disease Brother   . Stroke Brother   . Hypertension Brother   .  Diabetes Sister   . Heart disease Sister   . Hypertension Sister   . Diabetes Sister   . Diabetes Brother   . COPD Neg Hx   . Kidney disease Neg Hx   . Prostate cancer Neg Hx   . Kidney cancer Neg Hx   . Bladder Cancer Neg Hx     Social History:  reports that he quit smoking about 38 years ago. His smoking use included Cigarettes. He has a 25.00 pack-year smoking history. He has never used smokeless tobacco. He reports that he does not drink alcohol or use drugs.  He stopped smoking in 1981.  He smoked 3 cigarettes/day.  He lives in [place].  The patient is accompanied by his wife, [name],  today.  Allergies: No Known Allergies  Current  Medications: Current Outpatient Prescriptions  Medication Sig Dispense Refill  . acetaminophen (TYLENOL) 325 MG tablet Take 2 tablets (650 mg total) by mouth every 6 (six) hours as needed for mild pain (or Fever >/= 101).    [name] albuterol (PROAIR HFA) 108 (90 BASE) MCG/ACT inhaler Inhale 1-2 puffs into the lungs every 4 (four) hours as needed.     [name] allopurinol (ZYLOPRIM) 100 MG tablet Take 2 tablets (200 mg total) by mouth daily. 60 tablet 2  . COLCRYS 0.6 MG tablet Take 1 tablet by mouth 2 (two) times daily as needed.    . diphenhydrAMINE (BENADRYL) 25 mg capsule Take 1 capsule (25 mg total) by mouth at bedtime as needed for sleep. 30 capsule 0  . feeding supplement, ENSURE ENLIVE, (ENSURE ENLIVE) LIQD Take 237 mLs by mouth 3 (three) times daily between meals. 90 Bottle 0  . finasteride (PROSCAR) 5 MG tablet Take 1 tablet (5 mg total) by mouth daily. 90 tablet 3  . hydroxypropyl methylcellulose (ISOPTO TEARS) 2.5 % ophthalmic solution Place 1 drop into both eyes as needed.     [name] lenalidomide (REVLIMID) 15 MG capsule Take 1 capsule (15 mg total) by mouth every other day. for 21 days then off 7 days 10 capsule 0  . megestrol (MEGACE) 400 MG/10ML suspension Take 5 mLs (200 mg total) by mouth daily. 240 mL 0  . metoprolol tartrate (LOPRESSOR) 25 MG tablet Take 12.5 mg by mouth 2 (two) times daily.     . Multiple Vitamin (MULTIVITAMIN) tablet Take 1 tablet by mouth daily.      . ondansetron (ZOFRAN) 4 MG tablet Take 1 tablet (4 mg total) by mouth every 6 (six) hours as needed for nausea. 20 tablet 0  . oxybutynin (DITROPAN-XL) 5 MG 24 hr tablet Take 1 tablet (5 mg total) by mouth daily. 90 tablet 3  . oxyCODONE-acetaminophen (ROXICET) 5-325 MG tablet Take 1 tablet by mouth every 6 (six) hours as needed. 30 tablet 0  . pantoprazole (PROTONIX) 40 MG tablet TAKE 1 TABLET BY MOUTH TWICE A DAY 60 tablet 1  . pantoprazole (PROTONIX) 40 MG tablet TAKE 1 TABLET BY MOUTH TWICE A DAY 60 tablet 1  . predniSONE  (DELTASONE) 20 MG tablet as directed.     . predniSONE (DELTASONE) 5 MG tablet Take 5 mg by mouth daily.     . simethicone (MYLICON) 80 MG chewable tablet Chew 1 tablet (80 mg total) by mouth every 6 (six) hours as needed for flatulence. 120 tablet 0  . sucralfate (CARAFATE) 1 g tablet Take 1 tablet (1 g total) by mouth 4 (four) times daily. Resume taking after one week- once finished taking oral levaquine. ( to  avoid interaction.) 40 tablet 3  . tacrolimus (PROGRAF) 1 MG capsule Take 3 mg by mouth 2 (two) times daily. Reported on 08/16/2015    . tamsulosin (FLOMAX) 0.4 MG CAPS capsule Take 1 capsule (0.4 mg total) by mouth daily. 90 capsule 3  . traZODone (DESYREL) 50 MG tablet 1-2 TABS AS NEEDED FOR SLEEP  3  . furosemide (LASIX) 20 MG tablet Take 20 mg by mouth every other day. Take 1-2 tablets daily.     No current facility-administered medications for this visit.    Facility-Administered Medications Ordered in Other Visits  Medication Dose Route Frequency Provider Last Rate Last Dose  . sodium chloride flush (NS) 0.9 % injection 10 mL  10 mL Intracatheter PRN Lequita Asal, MD        Review of Systems:  GENERAL:  Feels "ok".  No fevers or sweats.  Weight up 1 pound.   PERFORMANCE STATUS (ECOG):  1 HEENT:  No visual changes, sore throat, mouth sores or tenderness. Lungs: No shortness of breath or cough.  No hemoptysis. Cardiac:  No chest pain, palpitations, orthopnea, or PND. GI:  Appetite improved on Megace.  Some diarrhea (see HPI).  No nausea, vomiting, constipation, melena or hematochezia. GU:  Enlarged prostate.  No urgency, frequency, dysuria, or hematuria.  Left inguinal hernia.  Right inguinal pain, resolved. Musculoskeletal: No back pain.  No joint pain.  No muscle tenderness. Extremities:  No pain or swelling. Skin:  Nail changes.  No rashes or skin changes. Neuro:  Forgetful.  No headache, numbness or weakness, balance or coordination issues. Endocrine:  No diabetes,  thyroid issues, hot flashes or night sweats. Psych:  No mood changes, depression or anxiety. Pain:  Np pain.   Review of systems:  All other systems reviewed and found to be negative.  Physical Exam: Blood pressure 125/67, pulse (!) 52, temperature 98.2 F (36.8 C), temperature source Tympanic, weight 118 lb (53.5 kg), SpO2 100 %. GENERAL:  Thin elderly gentleman sitting comfortably in the exam room in a wheelchair in no acute distress.  MENTAL STATUS:  Alert and oriented to person, place and time. HEAD:  [place].  Temporal wasting.  Normocephalic, atraumatic, face symmetric, no Cushingoid features. EYES:  Glasses.  Brown eyes.  Pupils equal round and reactive to light and accomodation.  No conjunctivitis or scleral icterus. ENT:  Oropharynx clear without lesion.  Edentulous.  Tongue normal. Mucous membranes moist.  RESPIRATORY:  Clear to auscultation without rales, wheezes or rhonchi. CARDIOVASCULAR:  Regular rate and rhythm without murmur, rub or gallop. ABDOMEN:  Soft, non-tender, with active bowel sounds, and no hepatosplenomegaly.  No masses.  Palpable transplanted kidney.  GU:  No groin pain on palpation. SKIN:  No rashes, ulcers or lesions. EXTREMITIES:  No edema, no skin discoloration or tenderness.  Right upper extremity with dilated vascular s/p AV fistula (chronic).  No palpable cords. NEUROLOGICAL: Unremarkable. PSYCH:  Appropriate.    Imaging studies:  04/28/2016 PET scan:  Bulky intensely hypermetabolic periaortic upper abdominal and mesenteric adenopathy concerning for high-grade lymphoma.  There was hypermetabolic liver metastasis.  There was hpermetabolic lesion involving the small bowel of the upper pelvis.  There were multiple sites of hypermetabolic skeletal metastasis. There was moderate volume right pneumothorax. 08/12/2016 PET scan:  Interval response to therapy. There has been significant decrease in extent of hypermetabolic tumor within the neck, chest, abdomen  and pelvis as well as the axial and appendicular skeleton.  There was residual enlarged and hypermetabolic small  bowel mesenteric and periaortic lymph nodes. There was a persistent hypermetabolic focus of increased uptake within the spleen.  There was resolution of previous multifocal hypermetabolic lesions within the liver. 10/10/2016 PET scan:  Slight worsening compared to the prior study. Several lesions were slightly more hypermetabolic and there appeared to be a newly enlarged and hypermetabolic mesenteric node adjacent to one of the previous mesenteric lymph nodes. However, there are no new hypermetabolic lesions in the neck, chest, or skeleton.  The small hypermetabolic peripheral lesion of the right kidney upper pole is slightly less hypermetabolic. 32/95/1884 PET scan:  Marked progression of disease as evidenced by progressive bulky mesenteric adenopathy, new low left internal jugular/left juxta diaphragmatic/abdominal retroperitoneal adenopathy, new pulmonary nodules, new hepatic and splenic lesions and new peritoneal nodules, all of which are hypermetabolic 16/60/6301 CT abdomen and pelvis: Slight increased size of left lower lobe pulmonary metastatic nodule. Re-demonstrated hypodense liver masses and splenic masses, consistent with metastatic disease, suspect increased in size compared to prior PET-CT. Bulky mesenteric and retroperitoneal masses consistent with metastatic disease, also increased in size compared to recent PET-CT. Atrophic native kidneys. Transplant kidney in the right lower quadrant. Negative for hydronephrosis. 03/08/2017:  Scrotal ultrasound revealed soft tissue structure in the right scrotum separate from the epididymis and testicle. There was internal blood flow. The appearance could represent bowel herniated into the right side of the scrotum but there  was no peristalsis and there was no definitive hernia seen on a recent CT. 03/08/2017:  Abdomen CT revealed stable lingular  and left lower lobe pulmonary metastatic nodules to the extent included.  There was slight interval increase in size of several hepatic metastatic lesions (4.3 cm vs 3.6 cm; 5.2 cm vs 4.9 cm).  There was equivocal overall change with reference to retroperitoneal and mesenteric lymphadenopathy, some areas appear to have increased in size such as the retrocaval lymph node mass (4.1 x 6.4 cm vs 6 x 3.1 cm), others remaining stable such as the left para-aortic and pelvic lymph node index masses and others appearing slightly smaller in the lower abdominal mesentery.   There was some mild nonspecific scrotal skin thickening.  There was no bowel herniation or definite intratesticular mass. Tubular densities were seen within the scrotum bilaterally which were likely related to the spermatic cord and vessels.   There was no significant hydrocele.   Appointment on 03/27/2017  Component Date Value Ref Range Status  . WBC 03/27/2017 4.0  3.8 - 10.6 K/uL Final  . RBC 03/27/2017 2.90* 4.40 - 5.90 MIL/uL Final  . Hemoglobin 03/27/2017 8.1* 13.0 - 18.0 g/dL Final  . HCT 03/27/2017 23.8* 40.0 - 52.0 % Final  . MCV 03/27/2017 82.0  80.0 - 100.0 fL Final  . MCH 03/27/2017 28.1  26.0 - 34.0 pg Final  . MCHC 03/27/2017 34.2  32.0 - 36.0 g/dL Final  . RDW 03/27/2017 22.3* 11.5 - 14.5 % Final  . Platelets 03/27/2017 160  150 - 440 K/uL Final  . Neutrophils Relative % 03/27/2017 75  % Final  . Neutro Abs 03/27/2017 3.0  1.4 - 6.5 K/uL Final  . Lymphocytes Relative 03/27/2017 12  % Final  . Lymphs Abs 03/27/2017 0.5* 1.0 - 3.6 K/uL Final  . Monocytes Relative 03/27/2017 10  % Final  . Monocytes Absolute 03/27/2017 0.4  0.2 - 1.0 K/uL Final  . Eosinophils Relative 03/27/2017 3  % Final  . Eosinophils Absolute 03/27/2017 0.1  0 - 0.7 K/uL Final  . Basophils Relative 03/27/2017 0  %  Final  . Basophils Absolute 03/27/2017 0.0  0 - 0.1 K/uL Final  . Sodium 03/27/2017 136  135 - 145 mmol/L Final  . Potassium 03/27/2017  4.8  3.5 - 5.1 mmol/L Final  . Chloride 03/27/2017 107  101 - 111 mmol/L Final  . CO2 03/27/2017 21* 22 - 32 mmol/L Final  . Glucose, Bld 03/27/2017 87  65 - 99 mg/dL Final  . BUN 03/27/2017 20  6 - 20 mg/dL Final  . Creatinine, Ser 03/27/2017 1.16  0.61 - 1.24 mg/dL Final  . Calcium 03/27/2017 9.8  8.9 - 10.3 mg/dL Final  . GFR calc non Af Amer 03/27/2017 59* >60 mL/min Final  . GFR calc Af Amer 03/27/2017 >60  >60 mL/min Final   Comment: (NOTE) The eGFR has been calculated using the CKD EPI equation. This calculation has not been validated in all clinical situations. eGFR's persistently <60 mL/min signify possible Chronic Kidney Disease.   . Anion gap 03/27/2017 8  5 - 15 Final  . Uric Acid, Serum 03/27/2017 5.1  4.4 - 7.6 mg/dL Final    Assessment:  IZAAH WESTMAN is a 76 y.o. male s/p renal transplant (2007) with a post-transplant lymphoproliferative disorder, stage IV diffuse large B cell lymphoma.  He presented with a 2-3 month history of progressive back pain superimposed on chronic back pain.    PET scan on 04/28/2016 revealed bulky intensely hypermetabolic periaortic upper abdominal and mesenteric adenopathy concerning for high-grade lymphoma.  There was hypermetabolic liver metastasis.  There was hpermetabolic lesion involving the small bowel of the upper pelvis.  There were multiple sites of hypermetabolic skeletal metastasis. There was moderate volume right pneumothorax.  CT guided retroperitoneal node biopsy on 04/30/2016 revealed diffuse large B cell lymphoma.  Hepatitis B and C testing were negative on 05/06/2016 and 02/03/2017.  Echo on 05/06/2016 revealed an EF of 55-60%.  Echo on 09/01/2016 revealed an EF of 60-65%.  Bone marrow aspirate and biopsy on 05/08/2016 revealed multifocal marrow involvement by diffuse large B-cell lymphoma. There was variably cellular marrow for age (50% - 90%) with a patchy predominantly nodular large B-cell infiltrate, overall estimated to account  for 20% of the core biopsy. There was adequate residual trilineage hematopoiesis with mild nonspecific dyserythropoiesis. There was patchy mild increase in reticulin. Storage iron was present. The immunohistochemical staining pattern of the B-cell infiltrate (CD10 +/-, BCL 6+, BCL-2 +) suggested possible large cell transformation of follicular lymphoma.  Flow cytometry revealed no significant immunophenotypic abnormalities or evidence of B-cell lymphoma.  He has bone metastasis and hypercalcemia.  Calcium was 11.2 (ionized 7.3) on 05/13/2016.  He received Zometa on 05/13/2016.  He began [place] on 06/09/2016 (last 08/05/2016).  Work-up on 04/15/2016 revealed the following normal studies: ferritin (343), iron saturation (6%), TIBC (241; low), B12 (525), folate (27).  Reticulocyte count was 2%.  LDH was 409.  Uric acid was 7.7 (4.4 - 7.6).  He was admitted at [facility] from 04/28/2016 - 04/30/2016 with a moderate volume right sided pneumothorax.  His pneumothorax improved spontaneously. Plain films of the right femur revealed the lucent bone lesion in the right femoral neck  was poorly characterized.  There was no evidence for an acute fracture.  PTH was 106 (high) 04/30/2016 with a calcium of 11.2.  Etiology was c/w primary hyperparathyroidism.  PTH-related polypeptide was < 1.1 on 04/30/2016.  He has a history of GI bleeding in 08/2014.  He underwent tagged RBC scan which revealed an active bleed in the hepatic flexure.  He was embolized in vascular interventional radiology.  He was admitted to [facility] from 07/08/2016 - 07/13/2016 with GI bleeding.  EGD on 07/09/2016 revealed multiple non-bleeding duodenal ulcer as well as duodenitis. Tagged RBC scan on 07/11/2016 revealed active GI bleed in the lateral right mid abdomen coursing through multiple curvilinear bowel loops in the right mid abdomen favoring a small bowel source of bleeding.  He received 5 units of PRBCs, 2 units of pheresed platelets, and vitamin K from  07/07/2016 0 07/13/2016.  He was transferred to [facility] from 07/13/2016 - 07/16/2016.  [facility] performed a colonoscopy and push enteroscopy which showed several diverticula and old blood but no source of bleeding. Source of bleed was presumed to be diverticular, although it was possible a small bowel site was not visualized on endoscopy.  He has a history of renal failure s/p renal transplant.  He is tacrolimus (Prograf), and steroids  Creatinine has ranged between 1.43 - 1.93 in the past 6 months. Mycophenolate (MMF) was discontinued at diagnosis.  Prograf level was 5.5 (3.0-8.0) on 05/08/2016, 2.2 on 08/13/2016, 2.3 in 01/01/2017.  He received 6 cycles of mini-RCHOP (05/09/2016 - 09/05/2016).  Cycle #1 was complicated by fever and neutropenia.  Cycle #3 was complicated by a GI bleed.  CSF on 06/19/2016 revealed 7 WBCs (4% segs, 79% lymphs, and 17% monocytes).  He has undergone LP with IT MTX x 4 (07/24/2016, 08/14/2016, 09/04/2016, and 10/02/2016).  Cytology was negative on 07/24/2016 and 09/04/2016.  PET scan on 10/10/2016 revealed slight worsening compared to the prior study. Several lesions were slightly more hypermetabolic and there appeared to be a newly enlarged and hypermetabolic mesenteric node adjacent to one of the previous mesenteric lymph nodes. However, there are no new hypermetabolic lesions in the neck, chest, or skeleton.  The small hypermetabolic peripheral lesion of the right kidney upper pole is slightly less hypermetabolic.  He was lost to follow-up.  PET scan on 01/14/2017 revealed marked progression of disease as evidenced by progressive bulky mesenteric adenopathy, new low left internal jugular/left juxta diaphragmatic/abdominal retroperitoneal adenopathy, new pulmonary nodules, new hepatic and splenic lesions and new peritoneal nodules, all of which are hypermetabolic.  LDH was 480 on 02/06/2017.  He received 2 cycles of gemcitabine and oxaliplatin + obinutuzumab (02/04/2017 -  02/24/2017) with Neulasta support.   Abdomen CT on 03/08/2017 revealed stable lingular and left lower lobe pulmonary metastatic nodules.  There was slight interval increase in size of several hepatic metastatic lesions (4.3 cm vs 3.6 cm; 5.2 cm vs 4.9 cm).  There was equivocal overall change with reference to retroperitoneal and mesenteric lymphadenopathy, some areas appear to have increased in size such as the retrocaval lymph node mass (4.1 x 6.4 cm vs 6 x 3.1 cm), others remaining stable such as the left para-aortic and pelvic lymph node index masses and others appearing slightly smaller in the lower abdominal mesentery.   There was some mild nonspecific scrotal skin thickening.  Tubular densities within the scrotum bilaterally were likely related to the spermatic cord and vessels.   There was no significant hydrocele.  He is day 9 of cycle #1 Rituxan and Revlimid (03/20/2017).  Symptomatically, he feels "ok". Patient eating better on Megace; weight up 1 pound. He denies any fevers or sweats.  RIGHT groin pain has resolved.  WBC is 4,000 with an [place] of 3,000. Hemoglobin 8.1, hematocrit 23.8, platelets 160,000. Creatinine has improved to 1.16. Uric down to 5.1. Potassium is 4.8.  Plan:  1.  Labs  today:  CBC with diff, BMP, uric acid. 2.  Reinforced chemotherapy plan.  Rituxan every 28 days.  Revlimid day 1-21 with 7 days off.  Revlimid dosing is 15 mg QOD secondary to renal function.  Discuss watching renal function and counts closely as he may require dose adjustment.  Side effects of treatment reviewed.  3.  Diarrhea while on Revlimid. Patient encouraged to use Loperamide PRN. 4.  Discuss weight. Patient is eating better and has gained weight since his last visit. Encouraged patient to continue to increase protein and calorie intake. We will continue on Megace 200 mg daily. We will recheck weight next week, with plans to increase the Megace dose if he notes a decrease in appetite, or if he loses  weight. 5.  Discuss baby aspirin a day. 6.  Influenza vaccination today.  7.  RTC on 04/03/2017 and 04/10/2018 for labs (CBC with diff, CMP/BMP, uric acid) 8.  RTC on 04/17/2017 for MD assessment, labs (CBC with diff, CMP, LDH, uric acid), and cycle #2 Rituxan.   Honor Loh, NP 03/27/2017,12:12 PM    I saw and evaluated the patient, participating in the key portions of the service and reviewing pertinent diagnostic studies and records.  I reviewed the nurse practitioner's note and agree with the findings and the plan.  The assessment and plan were discussed with the patient.  Multiple questions were asked by the patient and answered.   Lequita Asal, MD 03/27/2017,12:12 PM

## 2017-03-29 ENCOUNTER — Encounter: Payer: Self-pay | Admitting: Hematology and Oncology

## 2017-03-31 ENCOUNTER — Telehealth: Payer: Self-pay | Admitting: Pharmacist

## 2017-03-31 NOTE — Telephone Encounter (Signed)
Oral Chemotherapy Pharmacist Encounter   Attempted to reach patient for follow up on oral medication: Revlimid. No answer. Unable to leave VM.  Thank you,  Darl Pikes, PharmD, BCPS Hematology/Oncology Clinical Pharmacist [facility] [phone]  03/31/2017 2:40 PM

## 2017-04-01 NOTE — Telephone Encounter (Signed)
Oral Chemotherapy Pharmacist Encounter  Received a call from Matthew Brown, she told me she forgot to give Matthew Brown his Revlimid on Monday 03/30/17 and instead gave it to him Tuesday 03/31/17. She wanted to know what she should do. I instructed her to continue with her every other day dosing. Since she gave him his Revlimid yesterday 03/31/17 she should HOLD his Revlimid today 04/01/17 and give it to him on Thursday 10/11 then every other day from there. She voiced her understanding of the plan.  While Matthew Brown was on the phone I walked her through the edits to make to her medication calendar.  Because of the every other day dosing and the quantity prescribed this change will NOT extend his Revlimid past Day 21 or result in more Revlimid exposure than planned.  She knows to call if they have any other questions  Thank you,  Darl Pikes, PharmD, BCPS Hematology/Oncology Clinical Pharmacist [facility] [phone]  04/01/2017 4:19 PM

## 2017-04-03 ENCOUNTER — Inpatient Hospital Stay: Payer: Medicare HMO

## 2017-04-03 ENCOUNTER — Other Ambulatory Visit: Payer: Medicare HMO

## 2017-04-03 DIAGNOSIS — C8330 Diffuse large B-cell lymphoma, unspecified site: Secondary | ICD-10-CM

## 2017-04-03 LAB — CBC WITH DIFFERENTIAL/PLATELET
Basophils Absolute: 0 10*3/uL (ref 0–0.1)
Basophils Relative: 1 %
Eosinophils Absolute: 0.2 10*3/uL (ref 0–0.7)
Eosinophils Relative: 9 %
HCT: 24.5 % — ABNORMAL LOW (ref 40.0–52.0)
Hemoglobin: 8.1 g/dL — ABNORMAL LOW (ref 13.0–18.0)
Lymphocytes Relative: 17 %
Lymphs Abs: 0.4 10*3/uL — ABNORMAL LOW (ref 1.0–3.6)
MCH: 28.2 pg (ref 26.0–34.0)
MCHC: 33.1 g/dL (ref 32.0–36.0)
MCV: 85.1 fL (ref 80.0–100.0)
Monocytes Absolute: 0.3 10*3/uL (ref 0.2–1.0)
Monocytes Relative: 14 %
Neutro Abs: 1.3 10*3/uL — ABNORMAL LOW (ref 1.4–6.5)
Neutrophils Relative %: 59 %
Platelets: 113 10*3/uL — ABNORMAL LOW (ref 150–440)
RBC: 2.88 MIL/uL — ABNORMAL LOW (ref 4.40–5.90)
RDW: 23.3 % — ABNORMAL HIGH (ref 11.5–14.5)
WBC: 2.2 10*3/uL — ABNORMAL LOW (ref 3.8–10.6)

## 2017-04-03 LAB — COMPREHENSIVE METABOLIC PANEL
ALT: 10 U/L — ABNORMAL LOW (ref 17–63)
AST: 24 U/L (ref 15–41)
Albumin: 3.7 g/dL (ref 3.5–5.0)
Alkaline Phosphatase: 77 U/L (ref 38–126)
Anion gap: 8 (ref 5–15)
BUN: 19 mg/dL (ref 6–20)
CO2: 22 mmol/L (ref 22–32)
Calcium: 9.7 mg/dL (ref 8.9–10.3)
Chloride: 103 mmol/L (ref 101–111)
Creatinine, Ser: 0.97 mg/dL (ref 0.61–1.24)
GFR calc Af Amer: >60 mL/min (ref >60)
GFR calc non Af Amer: >60 mL/min (ref >60)
Glucose, Bld: 93 mg/dL (ref 65–99)
Potassium: 4.3 mmol/L (ref 3.5–5.1)
Sodium: 133 mmol/L — ABNORMAL LOW (ref 135–145)
Total Bilirubin: 0.8 mg/dL (ref 0.3–1.2)
Total Protein: 6.2 g/dL — ABNORMAL LOW (ref 6.5–8.1)

## 2017-04-03 LAB — URIC ACID: Uric Acid, Serum: 4.1 mg/dL — ABNORMAL LOW (ref 4.4–7.6)

## 2017-04-06 MED ORDER — ALLOPURINOL 100 MG TABLET
ORAL_TABLET | Freq: Every day | ORAL | 3 refills | 0.00000 days | Status: CP
Start: 2017-04-06 — End: 2018-04-06

## 2017-04-06 NOTE — Unmapped (Signed)
Patient called on call nurse and stated that one of the knots in my fistula is beats.  He stated he has not had an trouble out of it in years.  He stated that the knot at the very end is beating slightly.  He denies pain, swelling, or redness at this time.  He states that he is in his normal state of health.  He explained that he had some trouble with this fistula in the past but no trouble recently.  I explained that since he is not having pain, redness or swelling he sounds like he is fine to wait to see someone tomorrow.  I explained that if he develops symptoms he needs to be see in a local ER tonight.      He wife then got on the phone and stated that he is currently taking lenalidomide and it can cause clots.  I again explained that since there is only a slight beating without redness, soreness, pain or swelling that I think he is fine to wait until tomorrow.  They voiced understanding and thanks.

## 2017-04-07 ENCOUNTER — Ambulatory Visit (INDEPENDENT_AMBULATORY_CARE_PROVIDER_SITE_OTHER): Payer: Medicare HMO | Admitting: Vascular Surgery

## 2017-04-07 ENCOUNTER — Telehealth: Payer: Self-pay | Admitting: *Deleted

## 2017-04-07 ENCOUNTER — Encounter (INDEPENDENT_AMBULATORY_CARE_PROVIDER_SITE_OTHER): Payer: Self-pay | Admitting: Vascular Surgery

## 2017-04-07 VITALS — BP 139/54 | HR 51 | Resp 16 | Ht 65.5 in | Wt 119.6 lb

## 2017-04-07 DIAGNOSIS — I10 Essential (primary) hypertension: Secondary | ICD-10-CM | POA: Diagnosis not present

## 2017-04-07 DIAGNOSIS — N186 End stage renal disease: Secondary | ICD-10-CM

## 2017-04-07 DIAGNOSIS — E782 Mixed hyperlipidemia: Secondary | ICD-10-CM

## 2017-04-07 DIAGNOSIS — Z940 Kidney transplant status: Secondary | ICD-10-CM

## 2017-04-07 DIAGNOSIS — T829XXA Unspecified complication of cardiac and vascular prosthetic device, implant and graft, initial encounter: Secondary | ICD-10-CM

## 2017-04-07 DIAGNOSIS — C8333 Diffuse large B-cell lymphoma, intra-abdominal lymph nodes: Secondary | ICD-10-CM

## 2017-04-07 NOTE — Assessment & Plan Note (Signed)
The transplant seems to be working so he is not on dialysis and does not his fistula for dialysis at this time.

## 2017-04-07 NOTE — Assessment & Plan Note (Signed)
But has not been on dialysis for many years secondary to a functional renal transplant.  No longer using his AV fistula

## 2017-04-07 NOTE — Progress Notes (Signed)
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   Creatinine, Ser 1.34 (H) 0.61 - 1.24 mg/dL   Calcium 9.8 8.9 - 10.3 mg/dL   GFR calc non Af Amer 50 (L) >60 mL/min   GFR calc Af Amer 58 (L) >60 mL/min    Comment: (NOTE) The eGFR has been calculated using the CKD EPI equation. This calculation has not been validated in all clinical situations. eGFR's persistently <60 mL/min signify possible Chronic Kidney Disease.    Anion gap 6 5 - 15  Uric acid     Status: None   Collection Time: 02/06/17 10:05 AM  Result Value Ref Range   Uric Acid, Serum 7.5 4.4 - 7.6 mg/dL  Lactate dehydrogenase     Status: Abnormal   Collection Time: 02/06/17 10:05 AM  Result Value Ref Range   LDH 480 (H) 98 - 192 U/L  Potassium     Status: None   Collection Time: 02/06/17  2:17 PM  Result Value Ref Range   Potassium 5.1 3.5 - 5.1 mmol/L  CBC with Differential/Platelet     Status: Abnormal   Collection Time: 02/10/17 11:05 AM  Result Value Ref Range   WBC 1.1 (LL) 3.8 - 10.6 K/uL    Comment: RESULT REPEATED AND VERIFIED [facility] CRITICAL VALUE PROTOCOL     RBC 3.27 (L) 4.40 - 5.90 MIL/uL   Hemoglobin 8.7 (L) 13.0 - 18.0 g/dL   HCT 26.3 (L) 40.0 - 52.0 %   MCV 80.4 80.0 - 100.0 fL   MCH 26.7 26.0 - 34.0 pg   MCHC 33.3 32.0 - 36.0 g/dL   RDW 20.5 (H) 11.5 - 14.5 %   Platelets 58 (L) 150 - 440 K/uL   Neutrophils Relative % 61 %   Lymphocytes Relative 30 %   Monocytes Relative 6 %   Eosinophils Relative 2 %   Basophils Relative 1 %   Neutro Abs 0.7 (L) 1.4 - 6.5 K/uL   Lymphs Abs 0.3 (L) 1.0 - 3.6 K/uL   Monocytes Absolute 0.1 (L) 0.2 - 1.0 K/uL   Eosinophils Absolute 0.0 0 - 0.7 K/uL   Basophils Absolute 0.0 0 - 0.1 K/uL   RBC Morphology MIXED RBC POPULATION    Smear Review SMEAR CONSISTANT WITH NEULASTA ADMIN ON 4/97/02    Basic metabolic panel     Status: Abnormal   Collection Time: 02/10/17 11:05 AM  Result Value Ref Range   Sodium 137 135 - 145 mmol/L   Potassium 5.3 (H) 3.5 - 5.1 mmol/L   Chloride 105 101 - 111 mmol/L   CO2 28 22 - 32 mmol/L   Glucose, Bld 99 65 - 99 mg/dL   BUN 19 6 - 20 mg/dL   Creatinine, Ser 1.17 0.61 - 1.24 mg/dL   Calcium 10.6 (H) 8.9 - 10.3 mg/dL   GFR calc non Af Amer 59 (L) >60 mL/min   GFR calc Af Amer >60 >60 mL/min    Comment: (NOTE) The eGFR has been calculated using the CKD EPI equation. This calculation has not been validated in all clinical situations. eGFR's persistently <60 mL/min signify possible Chronic Kidney Disease.    Anion gap 4 (L) 5 - 15  Uric acid     Status: None   Collection Time: 02/10/17 11:05 AM  Result Value Ref Range   Uric Acid, Serum 6.7 4.4 - 7.6 mg/dL  Sample to Blood Bank     Status: None   Collection Time: 02/10/17 11:05 AM  Result Value Ref Range   Blood Bank Specimen SAMPLE AVAILABLE FOR TESTING    Sample Expiration 63/78/5885   Basic metabolic panel     Status: Abnormal   Collection Time: 02/12/17  8:29 AM  Result Value Ref Range   Sodium 136 135 - 145 mmol/L   Potassium 4.7 3.5 - 5.1 mmol/L   Chloride 103 101 - 111 mmol/L   CO2 28 22 - 32 mmol/L    Glucose, Bld 105 (H) 65 - 99 mg/dL   BUN 20 6 - 20 mg/dL   Creatinine, Ser 1.37 (H) 0.61 - 1.24 mg/dL   Calcium 11.1 (H) 8.9 - 10.3 mg/dL   GFR calc non Af Amer 49 (L) >60 mL/min   GFR calc Af Amer 56 (L) >60 mL/min    Comment: (NOTE) The eGFR has been calculated using the CKD EPI equation. This calculation has not been validated in all clinical situations. eGFR's persistently <60 mL/min signify possible Chronic Kidney Disease.    Anion gap 5 5 - 15  CBC with Differential/Platelet  Status: Abnormal   Collection Time: 02/12/17  8:29 AM  Result Value Ref Range   WBC 2.5 (L) 3.8 - 10.6 K/uL   RBC 3.24 (L) 4.40 - 5.90 MIL/uL   Hemoglobin 8.8 (L) 13.0 - 18.0 g/dL   HCT 25.7 (L) 40.0 - 52.0 %   MCV 79.2 (L) 80.0 - 100.0 fL   MCH 27.2 26.0 - 34.0 pg   MCHC 34.4 32.0 - 36.0 g/dL   RDW 20.0 (H) 11.5 - 14.5 %   Platelets 36 (L) 150 - 440 K/uL   Neutrophils Relative % 63 %   Lymphocytes Relative 21 %   Monocytes Relative 15 %   Eosinophils Relative 1 %   Basophils Relative 0 %   Neutro Abs 1.6 1.4 - 6.5 K/uL   Lymphs Abs 0.5 (L) 1.0 - 3.6 K/uL   Monocytes Absolute 0.4 0.2 - 1.0 K/uL   Eosinophils Absolute 0.0 0 - 0.7 K/uL   Basophils Absolute 0.0 0 - 0.1 K/uL   RBC Morphology MIXED RBC POPULATION     Comment: POLYCHROMASIA PRESENT   Smear Review PLATELETS APPEAR DECREASED     Comment: SMEAR CONSISTANT WITH NEULASTA ADMIN 02/05/2017   Uric acid     Status: None   Collection Time: 02/12/17  8:29 AM  Result Value Ref Range   Uric Acid, Serum 6.7 4.4 - 7.6 mg/dL  CBC     Status: Abnormal   Collection Time: 02/13/17  9:24 AM  Result Value Ref Range   WBC 5.2 3.8 - 10.6 K/uL   RBC 3.18 (L) 4.40 - 5.90 MIL/uL   Hemoglobin 8.7 (L) 13.0 - 18.0 g/dL   HCT 25.2 (L) 40.0 - 52.0 %   MCV 79.3 (L) 80.0 - 100.0 fL   MCH 27.5 26.0 - 34.0 pg   MCHC 34.6 32.0 - 36.0 g/dL   RDW 20.3 (H) 11.5 - 14.5 %   Platelets 34 (L) 150 - 440 K/uL  Basic metabolic panel     Status: Abnormal   Collection  Time: 02/13/17  9:24 AM  Result Value Ref Range   Sodium 137 135 - 145 mmol/L   Potassium 4.8 3.5 - 5.1 mmol/L   Chloride 104 101 - 111 mmol/L   CO2 28 22 - 32 mmol/L   Glucose, Bld 108 (H) 65 - 99 mg/dL   BUN 22 (H) 6 - 20 mg/dL   Creatinine, Ser 1.37 (H) 0.61 - 1.24 mg/dL   Calcium 10.8 (H) 8.9 - 10.3 mg/dL   GFR calc non Af Amer 49 (L) >60 mL/min   GFR calc Af Amer 56 (L) >60 mL/min    Comment: (NOTE) The eGFR has been calculated using the CKD EPI equation. This calculation has not been validated in all clinical situations. eGFR's persistently <60 mL/min signify possible Chronic Kidney Disease.    Anion gap 5 5 - 15  Hold Tube- Blood Bank     Status: None   Collection Time: 02/13/17  9:24 AM  Result Value Ref Range   Blood Bank Specimen SAMPLE AVAILABLE FOR TESTING    Sample Expiration 02/16/2017   CBC with Differential/Platelet     Status: Abnormal   Collection Time: 02/16/17  9:24 AM  Result Value Ref Range   WBC 17.4 (H) 3.8 - 10.6 K/uL   RBC 3.35 (L) 4.40 - 5.90 MIL/uL   Hemoglobin 9.0 (L) 13.0 - 18.0 g/dL   HCT 26.4 (L) 40.0 - 52.0 %   MCV 78.9 (L) 80.0 - 100.0  fL   MCH 27.0 26.0 - 34.0 pg   MCHC 34.2 32.0 - 36.0 g/dL   RDW 20.2 (H) 11.5 - 14.5 %   Platelets 54 (L) 150 - 440 K/uL   Neutrophils Relative % 90 %   Neutro Abs 15.6 (H) 1.4 - 6.5 K/uL   Lymphocytes Relative 4 %   Lymphs Abs 0.6 (L) 1.0 - 3.6 K/uL   Monocytes Relative 6 %   Monocytes Absolute 1.1 (H) 0.2 - 1.0 K/uL   Eosinophils Relative 0 %   Eosinophils Absolute 0.0 0 - 0.7 K/uL   Basophils Relative 0 %   Basophils Absolute 0.0 0 - 0.1 K/uL  Comprehensive metabolic panel     Status: Abnormal   Collection Time: 02/16/17  9:24 AM  Result Value Ref Range   Sodium 132 (L) 135 - 145 mmol/L   Potassium 4.7 3.5 - 5.1 mmol/L   Chloride 99 (L) 101 - 111 mmol/L   CO2 28 22 - 32 mmol/L   Glucose, Bld 99 65 - 99 mg/dL   BUN 15 6 - 20 mg/dL   Creatinine, Ser 1.35 (H) 0.61 - 1.24 mg/dL   Calcium 10.8 (H)  8.9 - 10.3 mg/dL   Total Protein 6.0 (L) 6.5 - 8.1 g/dL   Albumin 3.6 3.5 - 5.0 g/dL   AST 34 15 - 41 U/L   ALT 14 (L) 17 - 63 U/L   Alkaline Phosphatase 114 38 - 126 U/L   Total Bilirubin 0.7 0.3 - 1.2 mg/dL   GFR calc non Af Amer 49 (L) >60 mL/min   GFR calc Af Amer 57 (L) >60 mL/min    Comment: (NOTE) The eGFR has been calculated using the CKD EPI equation. This calculation has not been validated in all clinical situations. eGFR's persistently <60 mL/min signify possible Chronic Kidney Disease.    Anion gap 5 5 - 15  Sample to Blood Bank     Status: None   Collection Time: 02/16/17  9:24 AM  Result Value Ref Range   Blood Bank Specimen SAMPLE AVAILABLE FOR TESTING    Sample Expiration 02/19/2017   CBC with Differential     Status: Abnormal   Collection Time: 02/16/17  8:12 PM  Result Value Ref Range   WBC 16.2 (H) 3.8 - 10.6 K/uL   RBC 3.62 (L) 4.40 - 5.90 MIL/uL   Hemoglobin 9.8 (L) 13.0 - 18.0 g/dL   HCT 29.2 (L) 40.0 - 52.0 %   MCV 80.7 80.0 - 100.0 fL   MCH 26.9 26.0 - 34.0 pg   MCHC 33.3 32.0 - 36.0 g/dL   RDW 20.0 (H) 11.5 - 14.5 %   Platelets 65 (L) 150 - 440 K/uL    Comment: PLATELET COUNT CONFIRMED BY SMEAR   Neutrophils Relative % 84 %   Lymphocytes Relative 8 %   Monocytes Relative 4 %   Eosinophils Relative 1 %   Basophils Relative 0 %   Band Neutrophils 0 %   Metamyelocytes Relative 2 %   Myelocytes 1 %   Promyelocytes Absolute 0 %   Blasts 0 %   nRBC 0 0 /100 WBC   Other 0 %   Neutro Abs 14.1 (H) 1.4 - 6.5 K/uL   Lymphs Abs 1.3 1.0 - 3.6 K/uL   Monocytes Absolute 0.6 0.2 - 1.0 K/uL   Eosinophils Absolute 0.2 0 - 0.7 K/uL   Basophils Absolute 0.0 0 - 0.1 K/uL   RBC Morphology MIXED RBC POPULATION  Comment: POLYCHROMASIA PRESENT ELLIPTOCYTES   Comprehensive metabolic panel     Status: Abnormal   Collection Time: 02/16/17  8:12 PM  Result Value Ref Range   Sodium 136 135 - 145 mmol/L   Potassium 4.8 3.5 - 5.1 mmol/L   Chloride 102 101 - 111  mmol/L   CO2 27 22 - 32 mmol/L   Glucose, Bld 140 (H) 65 - 99 mg/dL   BUN 16 6 - 20 mg/dL   Creatinine, Ser 1.55 (H) 0.61 - 1.24 mg/dL   Calcium 10.9 (H) 8.9 - 10.3 mg/dL   Total Protein 6.5 6.5 - 8.1 g/dL   Albumin 3.9 3.5 - 5.0 g/dL   AST 35 15 - 41 U/L   ALT 14 (L) 17 - 63 U/L   Alkaline Phosphatase 117 38 - 126 U/L   Total Bilirubin 0.8 0.3 - 1.2 mg/dL   GFR calc non Af Amer 42 (L) >60 mL/min   GFR calc Af Amer 48 (L) >60 mL/min    Comment: (NOTE) The eGFR has been calculated using the CKD EPI equation. This calculation has not been validated in all clinical situations. eGFR's persistently <60 mL/min signify possible Chronic Kidney Disease.    Anion gap 7 5 - 15  Urinalysis, Complete w Microscopic     Status: Abnormal   Collection Time: 02/16/17  9:41 PM  Result Value Ref Range   Color, Urine YELLOW (A) YELLOW   APPearance CLEAR (A) CLEAR   Specific Gravity, Urine 1.008 1.005 - 1.030   pH 5.0 5.0 - 8.0   Glucose, UA NEGATIVE NEGATIVE mg/dL   Hgb urine dipstick NEGATIVE NEGATIVE   Bilirubin Urine NEGATIVE NEGATIVE   Ketones, ur NEGATIVE NEGATIVE mg/dL   Protein, ur NEGATIVE NEGATIVE mg/dL   Nitrite NEGATIVE NEGATIVE   Leukocytes, UA NEGATIVE NEGATIVE   RBC / HPF 0-5 0 - 5 RBC/hpf   WBC, UA 0-5 0 - 5 WBC/hpf   Bacteria, UA NONE SEEN NONE SEEN   Squamous Epithelial / LPF 0-5 (A) NONE SEEN  Urinalysis, Complete     Status: None   Collection Time: 02/18/17  2:12 PM  Result Value Ref Range   Specific Gravity, UA 1.025 1.005 - 1.030   pH, UA 5.0 5.0 - 7.5   Color, UA Yellow Yellow   Appearance Ur Clear Clear   Leukocytes, UA Negative Negative   Protein, UA Negative Negative/Trace   Glucose, UA Negative Negative   Ketones, UA Negative Negative   RBC, UA Negative Negative   Bilirubin, UA Negative Negative   Urobilinogen, Ur 1.0 0.2 - 1.0 mg/dL   Nitrite, UA Negative Negative  CBC with Differential/Platelet     Status: Abnormal   Collection Time: 02/19/17  8:35 AM    Result Value Ref Range   WBC 17.2 (H) 3.8 - 10.6 K/uL   RBC 3.36 (L) 4.40 - 5.90 MIL/uL   Hemoglobin 9.1 (L) 13.0 - 18.0 g/dL   HCT 26.6 (L) 40.0 - 52.0 %   MCV 79.0 (L) 80.0 - 100.0 fL   MCH 27.1 26.0 - 34.0 pg   MCHC 34.2 32.0 - 36.0 g/dL   RDW 20.4 (H) 11.5 - 14.5 %   Platelets 134 (L) 150 - 440 K/uL   Neutrophils Relative % 89 %   Neutro Abs 15.2 (H) 1.4 - 6.5 K/uL   Lymphocytes Relative 3 %   Lymphs Abs 0.6 (L) 1.0 - 3.6 K/uL   Monocytes Relative 8 %   Monocytes Absolute 1.4 (H)  0.2 - 1.0 K/uL   Eosinophils Relative 0 %   Eosinophils Absolute 0.0 0 - 0.7 K/uL   Basophils Relative 0 %   Basophils Absolute 0.0 0 - 0.1 K/uL  Comprehensive metabolic panel     Status: Abnormal   Collection Time: 02/19/17  8:35 AM  Result Value Ref Range   Sodium 132 (L) 135 - 145 mmol/L   Potassium 4.3 3.5 - 5.1 mmol/L   Chloride 97 (L) 101 - 111 mmol/L   CO2 27 22 - 32 mmol/L   Glucose, Bld 86 65 - 99 mg/dL   BUN 22 (H) 6 - 20 mg/dL   Creatinine, Ser 1.68 (H) 0.61 - 1.24 mg/dL   Calcium 9.9 8.9 - 10.3 mg/dL   Total Protein 6.1 (L) 6.5 - 8.1 g/dL   Albumin 3.7 3.5 - 5.0 g/dL   AST 36 15 - 41 U/L   ALT 12 (L) 17 - 63 U/L   Alkaline Phosphatase 118 38 - 126 U/L   Total Bilirubin 0.9 0.3 - 1.2 mg/dL   GFR calc non Af Amer 38 (L) >60 mL/min   GFR calc Af Amer 44 (L) >60 mL/min    Comment: (NOTE) The eGFR has been calculated using the CKD EPI equation. This calculation has not been validated in all clinical situations. eGFR's persistently <60 mL/min signify possible Chronic Kidney Disease.    Anion gap 8 5 - 15  Lactate dehydrogenase     Status: Abnormal   Collection Time: 02/19/17  8:35 AM  Result Value Ref Range   LDH 525 (H) 98 - 192 U/L  Uric acid     Status: Abnormal   Collection Time: 02/19/17  8:35 AM  Result Value Ref Range   Uric Acid, Serum 8.1 (H) 4.4 - 7.6 mg/dL  CBC with Differential/Platelet     Status: Abnormal   Collection Time: 02/24/17  8:11 AM  Result Value Ref  Range   WBC 14.8 (H) 3.8 - 10.6 K/uL   RBC 3.32 (L) 4.40 - 5.90 MIL/uL   Hemoglobin 9.0 (L) 13.0 - 18.0 g/dL   HCT 26.6 (L) 40.0 - 52.0 %   MCV 80.0 80.0 - 100.0 fL   MCH 27.1 26.0 - 34.0 pg   MCHC 33.8 32.0 - 36.0 g/dL   RDW 20.6 (H) 11.5 - 14.5 %   Platelets 245 150 - 440 K/uL   Neutrophils Relative % 84 %   Neutro Abs 12.5 (H) 1.4 - 6.5 K/uL   Lymphocytes Relative 4 %   Lymphs Abs 0.6 (L) 1.0 - 3.6 K/uL   Monocytes Relative 12 %   Monocytes Absolute 1.7 (H) 0.2 - 1.0 K/uL   Eosinophils Relative 0 %   Eosinophils Absolute 0.0 0 - 0.7 K/uL   Basophils Relative 0 %   Basophils Absolute 0.0 0 - 0.1 K/uL  Comprehensive metabolic panel     Status: Abnormal   Collection Time: 02/24/17  8:11 AM  Result Value Ref Range   Sodium 135 135 - 145 mmol/L   Potassium 4.5 3.5 - 5.1 mmol/L   Chloride 103 101 - 111 mmol/L   CO2 27 22 - 32 mmol/L   Glucose, Bld 107 (H) 65 - 99 mg/dL   BUN 25 (H) 6 - 20 mg/dL   Creatinine, Ser 1.39 (H) 0.61 - 1.24 mg/dL   Calcium 9.6 8.9 - 10.3 mg/dL   Total Protein 6.4 (L) 6.5 - 8.1 g/dL   Albumin 3.4 (L) 3.5 - 5.0 g/dL  AST 38 15 - 41 U/L   ALT 13 (L) 17 - 63 U/L   Alkaline Phosphatase 118 38 - 126 U/L   Total Bilirubin 0.4 0.3 - 1.2 mg/dL   GFR calc non Af Amer 48 (L) >60 mL/min   GFR calc Af Amer 55 (L) >60 mL/min    Comment: (NOTE) The eGFR has been calculated using the CKD EPI equation. This calculation has not been validated in all clinical situations. eGFR's persistently <60 mL/min signify possible Chronic Kidney Disease.    Anion gap 5 5 - 15  Uric acid     Status: None   Collection Time: 02/24/17  8:11 AM  Result Value Ref Range   Uric Acid, Serum 7.4 4.4 - 7.6 mg/dL  CBC with Differential     Status: Abnormal   Collection Time: 02/27/17 10:35 AM  Result Value Ref Range   WBC 41.6 (H) 3.8 - 10.6 K/uL   RBC 3.06 (L) 4.40 - 5.90 MIL/uL   Hemoglobin 8.1 (L) 13.0 - 18.0 g/dL   HCT 24.6 (L) 40.0 - 52.0 %   MCV 80.5 80.0 - 100.0 fL   MCH  26.6 26.0 - 34.0 pg   MCHC 33.1 32.0 - 36.0 g/dL   RDW 21.1 (H) 11.5 - 14.5 %   Platelets 173 150 - 440 K/uL   Neutrophils Relative % 98 %   Neutro Abs 40.8 (H) 1.4 - 6.5 K/uL   Lymphocytes Relative 1 %   Lymphs Abs 0.5 (L) 1.0 - 3.6 K/uL   Monocytes Relative 1 %   Monocytes Absolute 0.2 0.2 - 1.0 K/uL   Eosinophils Relative 0 %   Eosinophils Absolute 0.0 0 - 0.7 K/uL   Basophils Relative 0 %   Basophils Absolute 0.0 0 - 0.1 K/uL  Comprehensive metabolic panel     Status: Abnormal   Collection Time: 02/27/17 10:35 AM  Result Value Ref Range   Sodium 137 135 - 145 mmol/L   Potassium 5.3 (H) 3.5 - 5.1 mmol/L   Chloride 107 101 - 111 mmol/L   CO2 21 (L) 22 - 32 mmol/L   Glucose, Bld 126 (H) 65 - 99 mg/dL   BUN 36 (H) 6 - 20 mg/dL   Creatinine, Ser 1.47 (H) 0.61 - 1.24 mg/dL   Calcium 9.7 8.9 - 10.3 mg/dL   Total Protein 6.0 (L) 6.5 - 8.1 g/dL   Albumin 3.3 (L) 3.5 - 5.0 g/dL   AST 38 15 - 41 U/L   ALT 14 (L) 17 - 63 U/L   Alkaline Phosphatase 127 (H) 38 - 126 U/L   Total Bilirubin 0.7 0.3 - 1.2 mg/dL   GFR calc non Af Amer 45 (L) >60 mL/min   GFR calc Af Amer 52 (L) >60 mL/min    Comment: (NOTE) The eGFR has been calculated using the CKD EPI equation. This calculation has not been validated in all clinical situations. eGFR's persistently <60 mL/min signify possible Chronic Kidney Disease.    Anion gap 9 5 - 15  Uric acid     Status: Abnormal   Collection Time: 02/27/17 10:35 AM  Result Value Ref Range   Uric Acid, Serum 8.2 (H) 4.4 - 7.6 mg/dL  Hold Tube- Blood Bank     Status: None   Collection Time: 03/05/17  9:30 AM  Result Value Ref Range   Blood Bank Specimen SAMPLE AVAILABLE FOR TESTING    Sample Expiration 03/08/2017   Type and screen     Status:  None   Collection Time: 03/05/17  9:30 AM  Result Value Ref Range   ABO/RH(D) O POS    Antibody Screen NEG    Sample Expiration 03/08/2017    Unit Number [id number]    Blood Component Type RBC, LR IRR    Unit  division 00    Status of Unit ISSUED,FINAL    Transfusion Status OK TO TRANSFUSE    Crossmatch Result Compatible   BPAM RBC     Status: None   Collection Time: 03/05/17  9:30 AM  Result Value Ref Range   ISSUE DATE / TIME [id number]    Blood Product Unit Number [id number]    PRODUCT CODE [id number]    Unit Type and Rh 5100    Blood Product Expiration Date [id number]   CBC with Differential     Status: Abnormal   Collection Time: 03/05/17  9:32 AM  Result Value Ref Range   WBC 13.3 (H) 3.8 - 10.6 K/uL   RBC 2.69 (L) 4.40 - 5.90 MIL/uL   Hemoglobin 7.2 (L) 13.0 - 18.0 g/dL   HCT 21.3 (L) 40.0 - 52.0 %   MCV 79.2 (L) 80.0 - 100.0 fL   MCH 27.0 26.0 - 34.0 pg   MCHC 34.0 32.0 - 36.0 g/dL   RDW 20.6 (H) 11.5 - 14.5 %   Platelets 51 (L) 150 - 440 K/uL   Neutrophils Relative % 89 %   Neutro Abs 11.8 (H) 1.4 - 6.5 K/uL   Lymphocytes Relative 4 %   Lymphs Abs 0.5 (L) 1.0 - 3.6 K/uL   Monocytes Relative 6 %   Monocytes Absolute 0.8 0.2 - 1.0 K/uL   Eosinophils Relative 1 %   Eosinophils Absolute 0.1 0 - 0.7 K/uL   Basophils Relative 0 %   Basophils Absolute 0.0 0 - 0.1 K/uL  Prepare RBC     Status: None   Collection Time: 03/05/17 10:30 AM  Result Value Ref Range   Order Confirmation ORDER PROCESSED BY BLOOD BANK   Comprehensive metabolic panel     Status: Abnormal   Collection Time: 03/08/17 12:37 PM  Result Value Ref Range   Sodium 138 135 - 145 mmol/L   Potassium 5.5 (H) 3.5 - 5.1 mmol/L   Chloride 110 101 - 111 mmol/L   CO2 23 22 - 32 mmol/L   Glucose, Bld 92 65 - 99 mg/dL   BUN 24 (H) 6 - 20 mg/dL   Creatinine, Ser 1.52 (H) 0.61 - 1.24 mg/dL   Calcium 9.9 8.9 - 10.3 mg/dL   Total Protein 5.9 (L) 6.5 - 8.1 g/dL   Albumin 3.3 (L) 3.5 - 5.0 g/dL   AST 37 15 - 41 U/L   ALT 11 (L) 17 - 63 U/L   Alkaline Phosphatase 177 (H) 38 - 126 U/L   Total Bilirubin 1.0 0.3 - 1.2 mg/dL   GFR calc non Af Amer 43 (L) >60 mL/min   GFR calc Af Amer 50 (L) >60 mL/min    Comment:  (NOTE) The eGFR has been calculated using the CKD EPI equation. This calculation has not been validated in all clinical situations. eGFR's persistently <60 mL/min signify possible Chronic Kidney Disease.    Anion gap 5 5 - 15  CBC with Differential     Status: Abnormal   Collection Time: 03/08/17 12:37 PM  Result Value Ref Range   WBC 26.0 (H) 3.8 - 10.6 K/uL   RBC 3.54 (L) 4.40 - 5.90 MIL/uL   Hemoglobin  9.5 (L) 13.0 - 18.0 g/dL   HCT 28.6 (L) 40.0 - 52.0 %   MCV 80.7 80.0 - 100.0 fL   MCH 27.0 26.0 - 34.0 pg   MCHC 33.4 32.0 - 36.0 g/dL   RDW 20.3 (H) 11.5 - 14.5 %   Platelets 83 (L) 150 - 440 K/uL   Neutrophils Relative % 94 %   Neutro Abs 24.3 (H) 1.4 - 6.5 K/uL   Lymphocytes Relative 2 %   Lymphs Abs 0.6 (L) 1.0 - 3.6 K/uL   Monocytes Relative 4 %   Monocytes Absolute 1.0 0.2 - 1.0 K/uL   Eosinophils Relative 0 %   Eosinophils Absolute 0.1 0 - 0.7 K/uL   Basophils Relative 0 %   Basophils Absolute 0.0 0 - 0.1 K/uL  Lactic acid, plasma     Status: None   Collection Time: 03/08/17  2:07 PM  Result Value Ref Range   Lactic Acid, Venous 1.6 0.5 - 1.9 mmol/L  CBC with Differential     Status: Abnormal   Collection Time: 03/10/17 10:07 AM  Result Value Ref Range   WBC 26.0 (H) 3.8 - 10.6 K/uL   RBC 3.36 (L) 4.40 - 5.90 MIL/uL   Hemoglobin 9.3 (L) 13.0 - 18.0 g/dL   HCT 27.2 (L) 40.0 - 52.0 %   MCV 80.9 80.0 - 100.0 fL   MCH 27.8 26.0 - 34.0 pg   MCHC 34.4 32.0 - 36.0 g/dL   RDW 21.0 (H) 11.5 - 14.5 %   Platelets 129 (L) 150 - 440 K/uL   Neutrophils Relative % 92 %   Neutro Abs 24.1 (H) 1.4 - 6.5 K/uL   Lymphocytes Relative 2 %   Lymphs Abs 0.6 (L) 1.0 - 3.6 K/uL   Monocytes Relative 5 %   Monocytes Absolute 1.2 (H) 0.2 - 1.0 K/uL   Eosinophils Relative 1 %   Eosinophils Absolute 0.1 0 - 0.7 K/uL   Basophils Relative 0 %   Basophils Absolute 0.0 0 - 0.1 K/uL  Basic metabolic panel     Status: Abnormal   Collection Time: 03/10/17 10:07 AM  Result Value Ref Range     Sodium 131 (L) 135 - 145 mmol/L   Potassium 5.1 3.5 - 5.1 mmol/L   Chloride 102 101 - 111 mmol/L   CO2 17 (L) 22 - 32 mmol/L   Glucose, Bld 64 (L) 65 - 99 mg/dL   BUN 30 (H) 6 - 20 mg/dL   Creatinine, Ser 1.80 (H) 0.61 - 1.24 mg/dL   Calcium 9.9 8.9 - 10.3 mg/dL   GFR calc non Af Amer 35 (L) >60 mL/min   GFR calc Af Amer 40 (L) >60 mL/min    Comment: (NOTE) The eGFR has been calculated using the CKD EPI equation. This calculation has not been validated in all clinical situations. eGFR's persistently <60 mL/min signify possible Chronic Kidney Disease.    Anion gap 12 5 - 15  Lactate dehydrogenase     Status: Abnormal   Collection Time: 03/10/17 10:07 AM  Result Value Ref Range   LDH 501 (H) 98 - 192 U/L  Uric acid     Status: Abnormal   Collection Time: 03/10/17 10:07 AM  Result Value Ref Range   Uric Acid, Serum 8.5 (H) 4.4 - 7.6 mg/dL  CBC with Differential     Status: Abnormal   Collection Time: 03/20/17  9:10 AM  Result Value Ref Range   WBC 9.8 3.8 - 10.6 K/uL  RBC 3.11 (L) 4.40 - 5.90 MIL/uL   Hemoglobin 8.8 (L) 13.0 - 18.0 g/dL   HCT 25.0 (L) 40.0 - 52.0 %   MCV 80.5 80.0 - 100.0 fL   MCH 28.4 26.0 - 34.0 pg   MCHC 35.3 32.0 - 36.0 g/dL   RDW 21.7 (H) 11.5 - 14.5 %   Platelets 163 150 - 440 K/uL   Neutrophils Relative % 86 %   Neutro Abs 8.4 (H) 1.4 - 6.5 K/uL   Lymphocytes Relative 5 %   Lymphs Abs 0.5 (L) 1.0 - 3.6 K/uL   Monocytes Relative 9 %   Monocytes Absolute 0.9 0.2 - 1.0 K/uL   Eosinophils Relative 0 %   Eosinophils Absolute 0.0 0 - 0.7 K/uL   Basophils Relative 0 %   Basophils Absolute 0.0 0 - 0.1 K/uL  Comprehensive metabolic panel     Status: Abnormal   Collection Time: 03/20/17  9:10 AM  Result Value Ref Range   Sodium 136 135 - 145 mmol/L   Potassium 4.9 3.5 - 5.1 mmol/L   Chloride 107 101 - 111 mmol/L   CO2 23 22 - 32 mmol/L   Glucose, Bld 110 (H) 65 - 99 mg/dL   BUN 37 (H) 6 - 20 mg/dL   Creatinine, Ser 2.00 (H) 0.61 - 1.24 mg/dL    Calcium 9.6 8.9 - 10.3 mg/dL   Total Protein 6.1 (L) 6.5 - 8.1 g/dL   Albumin 3.3 (L) 3.5 - 5.0 g/dL   AST 26 15 - 41 U/L   ALT 8 (L) 17 - 63 U/L   Alkaline Phosphatase 99 38 - 126 U/L   Total Bilirubin 0.6 0.3 - 1.2 mg/dL   GFR calc non Af Amer 31 (L) >60 mL/min   GFR calc Af Amer 36 (L) >60 mL/min    Comment: (NOTE) The eGFR has been calculated using the CKD EPI equation. This calculation has not been validated in all clinical situations. eGFR's persistently <60 mL/min signify possible Chronic Kidney Disease.    Anion gap 6 5 - 15  Lactate dehydrogenase     Status: Abnormal   Collection Time: 03/20/17  9:10 AM  Result Value Ref Range   LDH 471 (H) 98 - 192 U/L  Uric acid     Status: Abnormal   Collection Time: 03/20/17  9:10 AM  Result Value Ref Range   Uric Acid, Serum 9.8 (H) 4.4 - 7.6 mg/dL  CBC with Differential     Status: Abnormal   Collection Time: 03/27/17 10:44 AM  Result Value Ref Range   WBC 4.0 3.8 - 10.6 K/uL   RBC 2.90 (L) 4.40 - 5.90 MIL/uL   Hemoglobin 8.1 (L) 13.0 - 18.0 g/dL   HCT 23.8 (L) 40.0 - 52.0 %   MCV 82.0 80.0 - 100.0 fL   MCH 28.1 26.0 - 34.0 pg   MCHC 34.2 32.0 - 36.0 g/dL   RDW 22.3 (H) 11.5 - 14.5 %   Platelets 160 150 - 440 K/uL   Neutrophils Relative % 75 %   Neutro Abs 3.0 1.4 - 6.5 K/uL   Lymphocytes Relative 12 %   Lymphs Abs 0.5 (L) 1.0 - 3.6 K/uL   Monocytes Relative 10 %   Monocytes Absolute 0.4 0.2 - 1.0 K/uL   Eosinophils Relative 3 %   Eosinophils Absolute 0.1 0 - 0.7 K/uL   Basophils Relative 0 %   Basophils Absolute 0.0 0 - 0.1 K/uL  Basic metabolic panel  Status: Abnormal   Collection Time: 03/27/17 10:44 AM  Result Value Ref Range   Sodium 136 135 - 145 mmol/L   Potassium 4.8 3.5 - 5.1 mmol/L   Chloride 107 101 - 111 mmol/L   CO2 21 (L) 22 - 32 mmol/L   Glucose, Bld 87 65 - 99 mg/dL   BUN 20 6 - 20 mg/dL   Creatinine, Ser 1.16 0.61 - 1.24 mg/dL   Calcium 9.8 8.9 - 10.3 mg/dL   GFR calc non Af Amer 59 (L) >60  mL/min   GFR calc Af Amer >60 >60 mL/min    Comment: (NOTE) The eGFR has been calculated using the CKD EPI equation. This calculation has not been validated in all clinical situations. eGFR's persistently <60 mL/min signify possible Chronic Kidney Disease.    Anion gap 8 5 - 15  Uric acid     Status: None   Collection Time: 03/27/17 10:44 AM  Result Value Ref Range   Uric Acid, Serum 5.1 4.4 - 7.6 mg/dL  CBC with Differential     Status: Abnormal   Collection Time: 04/03/17 11:25 AM  Result Value Ref Range   WBC 2.2 (L) 3.8 - 10.6 K/uL   RBC 2.88 (L) 4.40 - 5.90 MIL/uL   Hemoglobin 8.1 (L) 13.0 - 18.0 g/dL   HCT 24.5 (L) 40.0 - 52.0 %   MCV 85.1 80.0 - 100.0 fL   MCH 28.2 26.0 - 34.0 pg   MCHC 33.1 32.0 - 36.0 g/dL   RDW 23.3 (H) 11.5 - 14.5 %   Platelets 113 (L) 150 - 440 K/uL   Neutrophils Relative % 59 %   Lymphocytes Relative 17 %   Monocytes Relative 14 %   Eosinophils Relative 9 %   Basophils Relative 1 %   Neutro Abs 1.3 (L) 1.4 - 6.5 K/uL   Lymphs Abs 0.4 (L) 1.0 - 3.6 K/uL   Monocytes Absolute 0.3 0.2 - 1.0 K/uL   Eosinophils Absolute 0.2 0 - 0.7 K/uL   Basophils Absolute 0.0 0 - 0.1 K/uL   Smear Review SMEAR SCANNED   Comprehensive metabolic panel     Status: Abnormal   Collection Time: 04/03/17 11:25 AM  Result Value Ref Range   Sodium 133 (L) 135 - 145 mmol/L   Potassium 4.3 3.5 - 5.1 mmol/L   Chloride 103 101 - 111 mmol/L   CO2 22 22 - 32 mmol/L   Glucose, Bld 93 65 - 99 mg/dL   BUN 19 6 - 20 mg/dL   Creatinine, Ser 0.97 0.61 - 1.24 mg/dL   Calcium 9.7 8.9 - 10.3 mg/dL   Total Protein 6.2 (L) 6.5 - 8.1 g/dL   Albumin 3.7 3.5 - 5.0 g/dL   AST 24 15 - 41 U/L   ALT 10 (L) 17 - 63 U/L   Alkaline Phosphatase 77 38 - 126 U/L   Total Bilirubin 0.8 0.3 - 1.2 mg/dL   GFR calc non Af Amer >60 >60 mL/min   GFR calc Af Amer >60 >60 mL/min    Comment: (NOTE) The eGFR has been calculated using the CKD EPI equation. This calculation has not been validated in all  clinical situations. eGFR's persistently <60 mL/min signify possible Chronic Kidney Disease.    Anion gap 8 5 - 15  Uric acid     Status: Abnormal   Collection Time: 04/03/17 11:25 AM  Result Value Ref Range   Uric Acid, Serum 4.1 (L) 4.4 - 7.6 mg/dL  Assessment/Plan:  Lymphoma (Benjamin Perez) Significant and clearly increases thrombotic potential.  Essential hypertension blood pressure control important in reducing the progression of atherosclerotic disease. On appropriate oral medications.   RENAL FAILURE, END STAGE But has not been on dialysis for many years secondary to a functional renal transplant.  No longer using his AV fistula  Renal transplant recipient The transplant seems to be working so he is not on dialysis and does not his fistula for dialysis at this time.  Complication of vascular access for dialysis The patient has a thrombosed right arm AV fistula.  Had a long discussion with the patient since he is not on dialysis, and the likelihood of success in performing a thrombectomy on his 76 year old right arm AV fistula is quite low, I would not recommend any intervention these do not tend to create pulmonary emboli, so no further anticoagulation or therapy is necessary.  He can use warm compresses and pain med his comfort.  I will see him back as needed.      Leotis Pain 04/07/2017, 1:11 PM   This note was created with Dragon medical transcription system.  Any errors from dictation are unintentional.

## 2017-04-07 NOTE — Telephone Encounter (Signed)
His right arm where he gets dialysis is not right, the "lumps are not full, they are not getting circulation in them and he is having pain too"   Discussed this with Dr Humberto Seals and she has contacted Dr Lucky Cowboy who has agreed to see patient today right now.   Lutretitia notified and agrees to take him to [facility] right now, directions given to her as to the location of the office

## 2017-04-07 NOTE — Assessment & Plan Note (Signed)
Significant and clearly increases thrombotic potential.

## 2017-04-07 NOTE — Assessment & Plan Note (Signed)
The patient has a thrombosed right arm AV fistula.  Had a long discussion with the patient since he is not on dialysis, and the likelihood of success in performing a thrombectomy on his 76 year old right arm AV fistula is quite low, I would not recommend any intervention these do not tend to create pulmonary emboli, so no further anticoagulation or therapy is necessary.  He can use warm compresses and pain med his comfort.  I will see him back as needed.

## 2017-04-07 NOTE — Assessment & Plan Note (Signed)
blood pressure control important in reducing the progression of atherosclerotic disease. On appropriate oral medications.  

## 2017-04-08 ENCOUNTER — Telehealth: Payer: Self-pay | Admitting: Pharmacist

## 2017-04-08 NOTE — Telephone Encounter (Signed)
Oral Chemotherapy Pharmacist Encounter  Received a call from Lenore Manner, Marshal Eskew significant other and caretaker. She was confused why the patient only 10 Revlimid tablets but was suppose to take the medication for 21 days. Explained to Ms. Laurance Flatten that because of his renal function we know that that it takes him longer to get rid of his Revlimid and that means the day after he takes the Revlimid we still count that as a day to treatment even though he is not taking the pill. She stated that she now understands.   She asked about when he would restart his Revlimid. I told her that if his labs are okay, he would restart on the day of his Rituximab infusion on 04/17/17. That would be considered his next day 1 of treatment. She stated her understanding.  Patient knows to call with any other questions.  Thank you,  Darl Pikes, PharmD, BCPS Hematology/Oncology Clinical Pharmacist [facility] [phone]  04/08/2017 10:46 AM

## 2017-04-09 ENCOUNTER — Telehealth: Payer: Self-pay | Admitting: *Deleted

## 2017-04-09 DIAGNOSIS — Z5189 Encounter for other specified aftercare: Secondary | ICD-10-CM

## 2017-04-09 NOTE — Telephone Encounter (Addendum)
Dr Mike Gip would like him to be seen in symptoms mgmt clinic today. Ms Laurance Flatten is not available to bring him due to a death in the family, so Dr C would like him to checked tomorrow

## 2017-04-09 NOTE — Telephone Encounter (Signed)
Reports that areas are the size of a penny and the areas on shoulder, arms/ hands and legs have come up since Monday He has areas on his back that are smaller and have been there longer.

## 2017-04-09 NOTE — Telephone Encounter (Signed)
Called to report that he has dark spots all over his body. Please advise

## 2017-04-09 NOTE — Telephone Encounter (Signed)
Ms Matthew Brown agree to 930 appointment tomorrow

## 2017-04-09 NOTE — Addendum Note (Signed)
Addended by: Betti Cruz on: 04/09/2017 12:47 PM   Modules accepted: Orders

## 2017-04-10 ENCOUNTER — Inpatient Hospital Stay (HOSPITAL_BASED_OUTPATIENT_CLINIC_OR_DEPARTMENT_OTHER): Payer: Medicare HMO | Admitting: Oncology

## 2017-04-10 ENCOUNTER — Other Ambulatory Visit: Payer: Medicare HMO

## 2017-04-10 ENCOUNTER — Inpatient Hospital Stay: Payer: Medicare HMO

## 2017-04-10 VITALS — BP 151/67 | HR 50 | Temp 97.8°F | Resp 18 | Ht 65.5 in | Wt 124.0 lb

## 2017-04-10 DIAGNOSIS — K219 Gastro-esophageal reflux disease without esophagitis: Secondary | ICD-10-CM

## 2017-04-10 DIAGNOSIS — L850 Acquired ichthyosis: Secondary | ICD-10-CM | POA: Diagnosis not present

## 2017-04-10 DIAGNOSIS — D696 Thrombocytopenia, unspecified: Secondary | ICD-10-CM

## 2017-04-10 DIAGNOSIS — T8610 Unspecified complication of kidney transplant: Secondary | ICD-10-CM

## 2017-04-10 DIAGNOSIS — M7989 Other specified soft tissue disorders: Secondary | ICD-10-CM | POA: Diagnosis not present

## 2017-04-10 DIAGNOSIS — I272 Pulmonary hypertension, unspecified: Secondary | ICD-10-CM

## 2017-04-10 DIAGNOSIS — C8330 Diffuse large B-cell lymphoma, unspecified site: Secondary | ICD-10-CM | POA: Diagnosis not present

## 2017-04-10 DIAGNOSIS — N400 Enlarged prostate without lower urinary tract symptoms: Secondary | ICD-10-CM | POA: Diagnosis not present

## 2017-04-10 DIAGNOSIS — Z940 Kidney transplant status: Secondary | ICD-10-CM

## 2017-04-10 DIAGNOSIS — I129 Hypertensive chronic kidney disease with stage 1 through stage 4 chronic kidney disease, or unspecified chronic kidney disease: Secondary | ICD-10-CM

## 2017-04-10 DIAGNOSIS — R197 Diarrhea, unspecified: Secondary | ICD-10-CM

## 2017-04-10 DIAGNOSIS — D47Z1 Post-transplant lymphoproliferative disorder (PTLD): Secondary | ICD-10-CM | POA: Diagnosis not present

## 2017-04-10 DIAGNOSIS — N529 Male erectile dysfunction, unspecified: Secondary | ICD-10-CM

## 2017-04-10 DIAGNOSIS — D649 Anemia, unspecified: Secondary | ICD-10-CM

## 2017-04-10 DIAGNOSIS — D709 Neutropenia, unspecified: Secondary | ICD-10-CM | POA: Diagnosis not present

## 2017-04-10 DIAGNOSIS — E785 Hyperlipidemia, unspecified: Secondary | ICD-10-CM

## 2017-04-10 DIAGNOSIS — Z8719 Personal history of other diseases of the digestive system: Secondary | ICD-10-CM

## 2017-04-10 DIAGNOSIS — E790 Hyperuricemia without signs of inflammatory arthritis and tophaceous disease: Secondary | ICD-10-CM

## 2017-04-10 DIAGNOSIS — M109 Gout, unspecified: Secondary | ICD-10-CM

## 2017-04-10 DIAGNOSIS — Z7901 Long term (current) use of anticoagulants: Secondary | ICD-10-CM

## 2017-04-10 DIAGNOSIS — Z87891 Personal history of nicotine dependence: Secondary | ICD-10-CM

## 2017-04-10 DIAGNOSIS — M1990 Unspecified osteoarthritis, unspecified site: Secondary | ICD-10-CM

## 2017-04-10 DIAGNOSIS — Z8619 Personal history of other infectious and parasitic diseases: Secondary | ICD-10-CM

## 2017-04-10 DIAGNOSIS — Z801 Family history of malignant neoplasm of trachea, bronchus and lung: Secondary | ICD-10-CM

## 2017-04-10 DIAGNOSIS — N186 End stage renal disease: Secondary | ICD-10-CM

## 2017-04-10 DIAGNOSIS — Q809 Congenital ichthyosis, unspecified: Secondary | ICD-10-CM

## 2017-04-10 LAB — CBC WITH DIFFERENTIAL/PLATELET
Basophils Absolute: 0 10*3/uL (ref 0–0.1)
Basophils Relative: 1 %
Eosinophils Absolute: 0.2 10*3/uL (ref 0–0.7)
Eosinophils Relative: 13 %
HCT: 25.2 % — ABNORMAL LOW (ref 40.0–52.0)
Hemoglobin: 8.4 g/dL — ABNORMAL LOW (ref 13.0–18.0)
Lymphocytes Relative: 39 %
Lymphs Abs: 0.7 10*3/uL — ABNORMAL LOW (ref 1.0–3.6)
MCH: 29.1 pg (ref 26.0–34.0)
MCHC: 33.3 g/dL (ref 32.0–36.0)
MCV: 87.4 fL (ref 80.0–100.0)
Monocytes Absolute: 0.2 10*3/uL (ref 0.2–1.0)
Monocytes Relative: 12 %
Neutro Abs: 0.6 10*3/uL — ABNORMAL LOW (ref 1.4–6.5)
Neutrophils Relative %: 35 %
Platelets: 108 10*3/uL — ABNORMAL LOW (ref 150–440)
RBC: 2.88 MIL/uL — ABNORMAL LOW (ref 4.40–5.90)
RDW: 23.6 % — ABNORMAL HIGH (ref 11.5–14.5)
WBC: 1.7 10*3/uL — ABNORMAL LOW (ref 3.8–10.6)

## 2017-04-10 LAB — BASIC METABOLIC PANEL
Anion gap: 6 (ref 5–15)
BUN: 20 mg/dL (ref 6–20)
CO2: 24 mmol/L (ref 22–32)
Calcium: 9.8 mg/dL (ref 8.9–10.3)
Chloride: 105 mmol/L (ref 101–111)
Creatinine, Ser: 1.15 mg/dL (ref 0.61–1.24)
GFR calc Af Amer: >60 mL/min (ref >60)
GFR calc non Af Amer: 60 mL/min — ABNORMAL LOW (ref >60)
Glucose, Bld: 95 mg/dL (ref 65–99)
Potassium: 4.4 mmol/L (ref 3.5–5.1)
Sodium: 135 mmol/L (ref 135–145)

## 2017-04-10 LAB — URIC ACID: Uric Acid, Serum: 4.2 mg/dL — ABNORMAL LOW (ref 4.4–7.6)

## 2017-04-10 NOTE — Progress Notes (Signed)
Symptom Management Consult note [facility]  Telephone:([phone] Fax:([phone]) [phone]  Patient Care Team: Arnetha Courser, MD as PCP - General (Family Medicine) Claybon Jabs, MD (Gastroenterology) Dewitt Rota, MD (Gastroenterology) True, Isabella Bowens, MD as Referring Physician (Nephrology) Prudencio Burly Lenna Sciara, MD (Internal Medicine) Rushing, Ledell Noss, MD (Ophthalmology) Go, Kelby Aline, MD as Referring Physician (Ophthalmology) True, Isabella Bowens, MD as Referring Physician (Nephrology) Sharlet Salina, MD as Referring Physician (Physical Medicine and Rehabilitation) Merlene Morse, MD as Referring Physician (Orthopedic Surgery) Dittus, Runell Gess, DO as Referring Physician (Pediatrics)   Name of the patient: Matthew Brown  [id number]  28-Dec-1940   Date of visit: 04/10/17  Diagnosis- post-transplant lymphoproliferative disorder, stage IVBE diffuse large B cell lymphoma  Chief complaint/ Reason for visit- Skin changes- "Rasied dark spots"  Heme/Onc history: Matthew Brown is a 76 y.o. male s/p renal transplant (2007) with a post-transplant lymphoproliferative disorder, stage IV diffuse large B cell lymphoma.  He presented with a 2-3 month history of progressive back pain superimposed on chronic back pain.    PET scan on 04/28/2016 revealed bulky intensely hypermetabolic periaortic upper abdominal and mesenteric adenopathy concerning for high-grade lymphoma.  There was hypermetabolic liver metastasis.  There was hpermetabolic lesion involving the small bowel of the upper pelvis.  There were multiple sites of hypermetabolic skeletal metastasis. There was moderate volume right pneumothorax.  CT guided retroperitoneal node biopsy on 04/30/2016 revealed diffuse large B cell lymphoma.  Hepatitis B and C testing were negative on 05/06/2016 and 02/03/2017.  Echo on 05/06/2016 revealed an EF of 55-60%.  Echo on 09/01/2016 revealed an EF of 60-65%.  Bone  marrow aspirate and biopsy on 05/08/2016 revealed multifocal marrow involvement by diffuse large B-cell lymphoma. There was variably cellular marrow for age (50% - 90%) with a patchy predominantly nodular large B-cell infiltrate, overall estimated to account for 20% of the core biopsy. There was adequate residual trilineage hematopoiesis with mild nonspecific dyserythropoiesis. There was patchy mild increase in reticulin. Storage iron was present. The immunohistochemical staining pattern of the B-cell infiltrate (CD10 +/-, BCL 6+, BCL-2 +) suggested possible large cell transformation of follicular lymphoma.  Flow cytometry revealed no significant immunophenotypic abnormalities or evidence of B-cell lymphoma.  He has bone metastasis and hypercalcemia.  Calcium was 11.2 (ionized 7.3) on 05/13/2016.  He received Zometa on 05/13/2016.  He began [place] on 06/09/2016 (last 08/05/2016).  Work-up on 04/15/2016 revealed the following normal studies: ferritin (343), iron saturation (6%), TIBC (241; low), B12 (525), folate (27).  Reticulocyte count was 2%.  LDH was 409.  Uric acid was 7.7 (4.4 - 7.6).  He was admitted at [facility] from 04/28/2016 - 04/30/2016 with a moderate volume right sided pneumothorax.  His pneumothorax improved spontaneously. Plain films of the right femur revealed the lucent bone lesion in the right femoral neck  was poorly characterized.  There was no evidence for an acute fracture.  PTH was 106 (high) 04/30/2016 with a calcium of 11.2.  Etiology was c/w primary hyperparathyroidism.  PTH-related polypeptide was < 1.1 on 04/30/2016.  He has a history of GI bleeding in 08/2014. He underwent tagged RBC scan which revealed an active bleed in the hepatic flexure. He was embolized in vascular interventional radiology.  He was admitted to [facility] from 07/08/2016 - 07/13/2016 with GI bleeding.  EGD on 07/09/2016 revealed multiple non-bleeding duodenal ulcer as well as duodenitis. Tagged RBC scan on  07/11/2016 revealed active GI bleed in the lateral right  mid abdomen coursing through multiple curvilinear bowel loops in the right mid abdomen favoring a small bowel source of bleeding.  He received 5 units of PRBCs, 2 units of pheresed platelets, and vitamin K from 07/07/2016 0 07/13/2016.  He was transferred to [facility] from 07/13/2016 - 07/16/2016.  [facility] performed a colonoscopy and push enteroscopy which showed several diverticula and old blood but no source of bleeding. Source of bleed was presumed to be diverticular, although it was possible a small bowel site was not visualized on endoscopy.  He has a history of renal failure s/p renal transplant.  He is tacrolimus (Prograf), and steroids  Creatinine has ranged between 1.43 - 1.93 in the past 6 months. Mycophenolate (MMF) was discontinued at diagnosis.  Prograf level was 5.5 (3.0-8.0) on 05/08/2016, 2.2 on 08/13/2016, 2.3 in 01/01/2017.  He received 6 cycles of mini-RCHOP (05/09/2016 - 09/05/2016).  Cycle #1 was complicated by fever and neutropenia.  Cycle #3 was complicated by a GI bleed.  CSF on 06/19/2016 revealed 7 WBCs (4% segs, 79% lymphs, and 17% monocytes).  He has undergone LP with IT MTX x 4 (07/24/2016, 08/14/2016, 09/04/2016, and 10/02/2016).  Cytology was negative on 07/24/2016 and 09/04/2016.  PET scan on 10/10/2016 revealed slight worsening compared to the prior study. Several lesions were slightly more hypermetabolic and there appeared to be a newly enlarged and hypermetabolic mesenteric node adjacent to one of the previous mesenteric lymph nodes. However, there are no new hypermetabolic lesions in the neck, chest, or skeleton.  The small hypermetabolic peripheral lesion of the right kidney upper pole is slightly less hypermetabolic.  He was lost to follow-up.  PET scan on 01/14/2017 revealed marked progression of disease as evidenced by progressive bulky mesenteric adenopathy, new low left internal jugular/left juxta  diaphragmatic/abdominal retroperitoneal adenopathy, new pulmonary nodules, new hepatic and splenic lesions and new peritoneal nodules, all of which are hypermetabolic.  LDH was 480 on 02/06/2017.  He received 2 cycles of gemcitabine and oxaliplatin + obinutuzumab (02/04/2017 - 02/24/2017) with Neulasta support.   Abdomen CT on 03/08/2017 revealed stable lingular and left lower lobe pulmonary metastatic nodules.  There was slight interval increase in size of several hepatic metastatic lesions (4.3 cm vs 3.6 cm; 5.2 cm vs 4.9 cm).  There was equivocal overall change with reference to retroperitoneal and mesenteric lymphadenopathy, some areas appear to have increased in size such as the retrocaval lymph node mass (4.1 x 6.4 cm vs 6 x 3.1 cm), others remaining stable such as the left para-aortic and pelvic lymph node index masses and others appearing slightly smaller in the lower abdominal mesentery.   There was some mild nonspecific scrotal skin thickening.  Tubular densities within the scrotum bilaterally were likely related to the spermatic cord and vessels.   There was no significant hydrocele.  He is day 9 of cycle #1 Rituxan and Revlimid (03/20/2017).  Interval history-Patient was last seen by Dr. Mike Gip on 03/27/2017 where he complained of bilateral ankle swelling and diarrhea. The diarrhea only occurred on days he was taking his Revlimid. He denied fevers and pain. He admitted to eating better and continued to use Megace. He received his first dose of Rituxin on 03/20/17 and was to begin his dose reduced Revlimid.   He was also seen by Dr. Lucky Cowboy in the interim on 04/07/2017 for evaluation of right arm AV fistula pain. It was determined that his right arm AV fistula was thrombosed and it was unlikely that he would be able to  perform a thrombectomy on the 76 year old right arm AV fistula. He also indicated that it was very unlikely he would develop a pulmonary emboli and no further anticoagulation was  needed at this time. He recommended warm compresses and pain management for comfort.  Today he presents for skin changes that his girlfriend noticed on his back, arms and legs on Monday. They are not painful or itchy. The areas were only noticed due the patient's girlfriend applying lotion after his showers. He recently finished his first course of Revlimid and had his first dose of Rituxan approximately 2 weeks ago. They're concerned that these new medications are causing these lesions. He denies fevers, shortness of breath or chest pain. He does admit to continued bilateral lower extremity swelling but has noticed the left leg appears more swollen over the past few days. His appetite has increased and he is eating several meals per day. He denies any other concerns today.   ECOG FS:1 - Symptomatic but completely ambulatory  Review of systems- Review of Systems  Constitutional: Negative.   Eyes: Negative.   Cardiovascular: Negative.   Gastrointestinal: Negative.   Genitourinary: Negative.   Musculoskeletal: Negative.   Skin: Positive for rash.       Small raised pigmented areas noted. One on back right shoulder, left and right arm and left leg.   Neurological: Negative.   Endo/Heme/Allergies: Negative.   Psychiatric/Behavioral: Negative.     Current treatment- Revlimid (every other day due to renal fucntion) and Rituximab QOD- Last treatment 03/20/17  No Known Allergies   Past Medical History:  Diagnosis Date  . Benign prostatic hypertrophy   . Chronic headache 10/19/2015  . ED (erectile dysfunction)   . End stage renal disease ([place])   . Essential hypertension   . GERD (gastroesophageal reflux disease)   . GIB (gastrointestinal bleeding)    a. 06/4429 s/p R colic artery embolization;  b. 02/2015 EGD: duod ulcerative mass->Bx notable for coagulative necrosis - ? ischemia vs thrombosis-->coumadin d/c'd.  . Gout   . Hearing loss   . Hemorrhoids   . Hyperlipidemia   . Lymphoma ([place])     . Lymphoma ([place]) 2017  . Multiple thyroid nodules 06/06/2016   Noted on carotid US; dedicated US to be ordered by staff  . Osteoarthrosis, unspecified whether generalized or localized, lower leg   . Persistent atrial fibrillation ([place])    a. CHA2DS2VASc = 3-->coumadin d/c'd 02/2015 2/2 recurrent GIB.  [name] Prostatitis   . Pulmonary hypertension ([place])    a. 10/2014 Echo: EF 60-65%, mild to mod MR, mildly dil LA, nl RV, PASP 60mmHg.  [name] Renal transplant recipient   . Ulcers of both great toes [facility])      Past Surgical History:  Procedure Laterality Date  . AV FISTULA PLACEMENT  1998  . BACK SURGERY    . ESOPHAGOGASTRODUODENOSCOPY  03/13/15   severe esophagitis, ulcerated mass  . ESOPHAGOGASTRODUODENOSCOPY (EGD) WITH PROPOFOL N/A 07/09/2016   Procedure: ESOPHAGOGASTRODUODENOSCOPY (EGD) WITH PROPOFOL;  Surgeon: [name], MD;  Location: [facility] ENDOSCOPY;  Service: Endoscopy;  Laterality: N/A;  . HERNIA REPAIR  1974  . KIDNEY TRANSPLANT  2006  . PERIPHERAL VASCULAR CATHETERIZATION N/A 05/07/2016   Procedure: Glori Luis Cath Insertion;  Surgeon: Algernon Huxley, MD;  Location: [place] [facility];  Service: Cardiovascular;  Laterality: N/A;  . PROSTATE ABLATION    . STOMACH SURGERY     blood vessel burst  . THROAT SURGERY    . TOTAL KNEE ARTHROPLASTY  Social History   Social History  . Marital status: Divorced    Spouse name: N/A  . Number of children: N/A  . Years of education: N/A   Occupational History  . Retired Retired   Social History Main Topics  . Smoking status: Former Smoker    Packs/day: 1.00    Years: 25.00    Types: Cigarettes    Quit date: 06/23/1978  . Smokeless tobacco: Never Used  . Alcohol use No  . Drug use: No  . Sexual activity: Yes   Other Topics Concern  . Not on file   Social History Narrative   Divorced   Does not get regular exercise    Family History  Problem Relation Age of Onset  . Cancer Mother        throat  . Diabetes Brother   . Heart  disease Brother   . Stroke Brother   . Hypertension Brother   . Diabetes Sister   . Heart disease Sister   . Hypertension Sister   . Diabetes Sister   . Diabetes Brother   . COPD Neg Hx   . Kidney disease Neg Hx   . Prostate cancer Neg Hx   . Kidney cancer Neg Hx   . Bladder Cancer Neg Hx      Current Outpatient Prescriptions:  .  acetaminophen (TYLENOL) 325 MG tablet, Take 2 tablets (650 mg total) by mouth every 6 (six) hours as needed for mild pain (or Fever >/= 101)., Disp: , Rfl:  .  albuterol (PROAIR HFA) 108 (90 BASE) MCG/ACT inhaler, Inhale 1-2 puffs into the lungs every 4 (four) hours as needed. , Disp: , Rfl:  .  allopurinol (ZYLOPRIM) 100 MG tablet, Take 2 tablets (200 mg total) by mouth daily., Disp: 60 tablet, Rfl: 2 .  finasteride (PROSCAR) 5 MG tablet, Take 1 tablet (5 mg total) by mouth daily., Disp: 90 tablet, Rfl: 3 .  furosemide (LASIX) 20 MG tablet, Take 20 mg by mouth every other day. , Disp: , Rfl:  .  hydroxypropyl methylcellulose (ISOPTO TEARS) 2.5 % ophthalmic solution, Place 1 drop into both eyes as needed. , Disp: , Rfl:  .  metoprolol tartrate (LOPRESSOR) 25 MG tablet, Take 12.5 mg by mouth 2 (two) times daily. , Disp: , Rfl:  .  Multiple Vitamin (MULTIVITAMIN) tablet, Take 1 tablet by mouth daily.  , Disp: , Rfl:  .  oxybutynin (DITROPAN-XL) 5 MG 24 hr tablet, Take 1 tablet (5 mg total) by mouth daily., Disp: 90 tablet, Rfl: 3 .  oxyCODONE-acetaminophen (ROXICET) 5-325 MG tablet, Take 1 tablet by mouth every 6 (six) hours as needed., Disp: 30 tablet, Rfl: 0 .  pantoprazole (PROTONIX) 40 MG tablet, TAKE 1 TABLET BY MOUTH TWICE A DAY, Disp: 60 tablet, Rfl: 1 .  predniSONE (DELTASONE) 5 MG tablet, Take 5 mg by mouth daily. , Disp: , Rfl:  .  simethicone (MYLICON) 80 MG chewable tablet, Chew 1 tablet (80 mg total) by mouth every 6 (six) hours as needed for flatulence., Disp: 120 tablet, Rfl: 0 .  tacrolimus (PROGRAF) 1 MG capsule, Take 3 mg by mouth 2 (two) times  daily. Reported on 08/16/2015, Disp: , Rfl:  .  tamsulosin (FLOMAX) 0.4 MG CAPS capsule, Take 1 capsule (0.4 mg total) by mouth daily., Disp: 90 capsule, Rfl: 3 .  COLCRYS 0.6 MG tablet, Take 1 tablet by mouth 2 (two) times daily as needed (gout). , Disp: , Rfl:  .  diphenhydrAMINE (BENADRYL) 25  mg capsule, Take 1 capsule (25 mg total) by mouth at bedtime as needed for sleep. (Patient not taking: Reported on 04/10/2017), Disp: 30 capsule, Rfl: 0 .  feeding supplement, ENSURE ENLIVE, (ENSURE ENLIVE) LIQD, Take 237 mLs by mouth 3 (three) times daily between meals. (Patient not taking: Reported on 04/10/2017), Disp: 90 Bottle, Rfl: 0 .  lenalidomide (REVLIMID) 15 MG capsule, Take 15 mg by mouth daily. Take 1 capsule (15 mg total) by mouth every other day. for 21 days then off 7 days, Disp: , Rfl:  .  megestrol (MEGACE) 400 MG/10ML suspension, Take 5 mLs (200 mg total) by mouth daily. (Patient not taking: Reported on 04/10/2017), Disp: 240 mL, Rfl: 0 .  ondansetron (ZOFRAN) 4 MG tablet, Take 1 tablet (4 mg total) by mouth every 6 (six) hours as needed for nausea. (Patient not taking: Reported on 04/10/2017), Disp: 20 tablet, Rfl: 0 .  sucralfate (CARAFATE) 1 g tablet, Take 1 tablet (1 g total) by mouth 4 (four) times daily. Resume taking after one week- once finished taking oral levaquine. ( to avoid interaction.) (Patient not taking: Reported on 04/10/2017), Disp: 40 tablet, Rfl: 3 No current facility-administered medications for this visit.   Facility-Administered Medications Ordered in Other Visits:  .  sodium chloride flush (NS) 0.9 % injection 10 mL, 10 mL, Intracatheter, PRN, Lequita Asal, MD  Physical exam:  Vitals:   04/10/17 1002  BP: (!) 151/67  Pulse: (!) 50  Resp: 18  Temp: 97.8 F (36.6 C)  TempSrc: Tympanic  Weight: 124 lb (56.2 kg)  Height: 5' 5.5" (1.664 m)   Physical Exam  Constitutional: He is oriented to person, place, and time and well-developed, well-nourished, and  in no distress.  HENT:  Head: Normocephalic and atraumatic.  Eyes: Pupils are equal, round, and reactive to light.  Neck: Normal range of motion. Neck supple.  Cardiovascular: Normal rate and regular rhythm.   Pulmonary/Chest: Effort normal and breath sounds normal.  Abdominal: Soft. Bowel sounds are normal.  Musculoskeletal: Normal range of motion.  Neurological: He is alert and oriented to person, place, and time.  Skin: Skin is warm and dry. Rash noted.        CMP Latest Ref Rng & Units 04/10/2017  Glucose 65 - 99 mg/dL 95  BUN 6 - 20 mg/dL 20  Creatinine 0.61 - 1.24 mg/dL 1.15  Sodium 135 - 145 mmol/L 135  Potassium 3.5 - 5.1 mmol/L 4.4  Chloride 101 - 111 mmol/L 105  CO2 22 - 32 mmol/L 24  Calcium 8.9 - 10.3 mg/dL 9.8  Total Protein 6.5 - 8.1 g/dL -  Total Bilirubin 0.3 - 1.2 mg/dL -  Alkaline Phos 38 - 126 U/L -  AST 15 - 41 U/L -  ALT 17 - 63 U/L -   CBC Latest Ref Rng & Units 04/10/2017  WBC 3.8 - 10.6 K/uL 1.7(L)  Hemoglobin 13.0 - 18.0 g/dL 8.4(L)  Hematocrit 40.0 - 52.0 % 25.2(L)  Platelets 150 - 440 K/uL 108(L)    No images are attached to the encounter.  No results found.   Assessment and plan- Patient is a 76 y.o. male with a new complaint of small dark leisons on his back, arm, legs. Areas of concern appear to be xeroderma. There is slightly raised, pigmented and scaly in appearance. They're not hot to touch or open. They are non-painful. He is afebrile. His VSS. He is neutropenic, anemic and thrombopenic.   1. Small raised pigmented lesions/Xeroderma: Patient denies  pruritus or pain to these areas. Continue to monitor in between treatments. Continue to apply non-scented emollients.  2. Bilateral leg swelling: Encouraged compression stockings to help with the swelling. Encouraged patient to keep legs elevated when sitting and laying down. Drink plenty of fluids. 3. Weight Gain: Encouraged patient to continue taking Megace because it seems to be  helping. 4. Neutropenic: WBC 1.7. ANC 0.6. Patient educated on neutropenic precautions. Patient also currently not taking Revlimid. Last dose was earlier this week. Patient's girlfriend and patient are poor historians. They have been in contact with Bry[facility] Ha regarding dosing of the Revlimid. 5. Thrombcytopenic: Plt 108. Continue to monitor for now.  6. RTC as scheduled to see Dr. Mike Gip next week.    Visit Diagnosis 1. Xeroderma   2. Anemia, unspecified type   3. Thrombocytopenia ([place])   4. Left leg swelling     Patient expressed understanding and was in agreement with this plan. He also understands that He can call clinic at any time with any questions, concerns, or complaints.    Marisue Humble [facility] Pager- [phone] 04/10/2017 3:18 PM

## 2017-04-10 NOTE — Progress Notes (Signed)
Patient here for symptom mgmt- clinic to evaluate a new onset of a pigmented dark rash on upper and lower extremities and back. Rash appeared after d/c Revlimid.  (note: Revlimid is not on pt's MAR- will f/u on this). Pt reports improvement in his appetite -noted weight gain. He is currently not using appetite stimulators. Pt reports hot flashes at bedtime. He reports 2 loose stools per day. He has only taken imodium AD once or twice in the last week. He also reports left ankle edema

## 2017-04-17 ENCOUNTER — Other Ambulatory Visit: Payer: Self-pay | Admitting: *Deleted

## 2017-04-17 ENCOUNTER — Inpatient Hospital Stay (HOSPITAL_BASED_OUTPATIENT_CLINIC_OR_DEPARTMENT_OTHER): Payer: Medicare HMO | Admitting: Hematology and Oncology

## 2017-04-17 ENCOUNTER — Other Ambulatory Visit: Payer: Self-pay | Admitting: Hematology and Oncology

## 2017-04-17 ENCOUNTER — Telehealth: Payer: Self-pay | Admitting: Hematology and Oncology

## 2017-04-17 ENCOUNTER — Ambulatory Visit: Payer: Medicare HMO | Admitting: Hematology and Oncology

## 2017-04-17 ENCOUNTER — Inpatient Hospital Stay: Payer: Medicare HMO

## 2017-04-17 ENCOUNTER — Emergency Department
Admission: EM | Admit: 2017-04-17 | Discharge: 2017-04-17 | Disposition: A | Discharge status: Home / Self Care | Payer: Medicare HMO | Attending: Emergency Medicine | Admitting: Emergency Medicine

## 2017-04-17 ENCOUNTER — Other Ambulatory Visit: Payer: Medicare HMO

## 2017-04-17 VITALS — BP 148/78 | HR 49 | Temp 97.1°F | Resp 20 | Wt 127.3 lb

## 2017-04-17 DIAGNOSIS — Z95828 Presence of other vascular implants and grafts: Secondary | ICD-10-CM | POA: Diagnosis not present

## 2017-04-17 DIAGNOSIS — M109 Gout, unspecified: Secondary | ICD-10-CM

## 2017-04-17 DIAGNOSIS — R197 Diarrhea, unspecified: Secondary | ICD-10-CM

## 2017-04-17 DIAGNOSIS — D709 Neutropenia, unspecified: Secondary | ICD-10-CM

## 2017-04-17 DIAGNOSIS — N529 Male erectile dysfunction, unspecified: Secondary | ICD-10-CM

## 2017-04-17 DIAGNOSIS — I272 Pulmonary hypertension, unspecified: Secondary | ICD-10-CM

## 2017-04-17 DIAGNOSIS — N186 End stage renal disease: Secondary | ICD-10-CM

## 2017-04-17 DIAGNOSIS — I120 Hypertensive chronic kidney disease with stage 5 chronic kidney disease or end stage renal disease: Secondary | ICD-10-CM | POA: Insufficient documentation

## 2017-04-17 DIAGNOSIS — Z8619 Personal history of other infectious and parasitic diseases: Secondary | ICD-10-CM

## 2017-04-17 DIAGNOSIS — Z8719 Personal history of other diseases of the digestive system: Secondary | ICD-10-CM

## 2017-04-17 DIAGNOSIS — T8610 Unspecified complication of kidney transplant: Secondary | ICD-10-CM | POA: Diagnosis not present

## 2017-04-17 DIAGNOSIS — D696 Thrombocytopenia, unspecified: Secondary | ICD-10-CM

## 2017-04-17 DIAGNOSIS — Z940 Kidney transplant status: Secondary | ICD-10-CM | POA: Insufficient documentation

## 2017-04-17 DIAGNOSIS — Z789 Other specified health status: Secondary | ICD-10-CM | POA: Diagnosis present

## 2017-04-17 DIAGNOSIS — M7989 Other specified soft tissue disorders: Secondary | ICD-10-CM

## 2017-04-17 DIAGNOSIS — Z7901 Long term (current) use of anticoagulants: Secondary | ICD-10-CM

## 2017-04-17 DIAGNOSIS — C8330 Diffuse large B-cell lymphoma, unspecified site: Secondary | ICD-10-CM | POA: Diagnosis not present

## 2017-04-17 DIAGNOSIS — Z87891 Personal history of nicotine dependence: Secondary | ICD-10-CM | POA: Insufficient documentation

## 2017-04-17 DIAGNOSIS — I129 Hypertensive chronic kidney disease with stage 1 through stage 4 chronic kidney disease, or unspecified chronic kidney disease: Secondary | ICD-10-CM

## 2017-04-17 DIAGNOSIS — M1990 Unspecified osteoarthritis, unspecified site: Secondary | ICD-10-CM

## 2017-04-17 DIAGNOSIS — C787 Secondary malignant neoplasm of liver and intrahepatic bile duct: Secondary | ICD-10-CM

## 2017-04-17 DIAGNOSIS — D649 Anemia, unspecified: Secondary | ICD-10-CM | POA: Diagnosis not present

## 2017-04-17 DIAGNOSIS — N400 Enlarged prostate without lower urinary tract symptoms: Secondary | ICD-10-CM

## 2017-04-17 DIAGNOSIS — C7951 Secondary malignant neoplasm of bone: Secondary | ICD-10-CM

## 2017-04-17 DIAGNOSIS — Z5112 Encounter for antineoplastic immunotherapy: Secondary | ICD-10-CM

## 2017-04-17 DIAGNOSIS — D47Z1 Post-transplant lymphoproliferative disorder (PTLD): Secondary | ICD-10-CM

## 2017-04-17 DIAGNOSIS — Z7189 Other specified counseling: Secondary | ICD-10-CM

## 2017-04-17 DIAGNOSIS — L850 Acquired ichthyosis: Secondary | ICD-10-CM

## 2017-04-17 DIAGNOSIS — Z79899 Other long term (current) drug therapy: Secondary | ICD-10-CM | POA: Diagnosis not present

## 2017-04-17 DIAGNOSIS — C7800 Secondary malignant neoplasm of unspecified lung: Secondary | ICD-10-CM

## 2017-04-17 DIAGNOSIS — E785 Hyperlipidemia, unspecified: Secondary | ICD-10-CM

## 2017-04-17 DIAGNOSIS — C7889 Secondary malignant neoplasm of other digestive organs: Secondary | ICD-10-CM

## 2017-04-17 DIAGNOSIS — C8333 Diffuse large B-cell lymphoma, intra-abdominal lymph nodes: Secondary | ICD-10-CM

## 2017-04-17 DIAGNOSIS — Z992 Dependence on renal dialysis: Secondary | ICD-10-CM | POA: Diagnosis not present

## 2017-04-17 DIAGNOSIS — K219 Gastro-esophageal reflux disease without esophagitis: Secondary | ICD-10-CM

## 2017-04-17 DIAGNOSIS — Z801 Family history of malignant neoplasm of trachea, bronchus and lung: Secondary | ICD-10-CM

## 2017-04-17 LAB — COMPREHENSIVE METABOLIC PANEL
ALT: 13 U/L — ABNORMAL LOW (ref 17–63)
AST: 27 U/L (ref 15–41)
Albumin: 3.5 g/dL (ref 3.5–5.0)
Alkaline Phosphatase: 71 U/L (ref 38–126)
Anion gap: 7 (ref 5–15)
BUN: 22 mg/dL — ABNORMAL HIGH (ref 6–20)
CO2: 21 mmol/L — ABNORMAL LOW (ref 22–32)
Calcium: 9.5 mg/dL (ref 8.9–10.3)
Chloride: 107 mmol/L (ref 101–111)
Creatinine, Ser: 1.11 mg/dL (ref 0.61–1.24)
GFR calc Af Amer: >60 mL/min (ref >60)
GFR calc non Af Amer: >60 mL/min (ref >60)
Glucose, Bld: 107 mg/dL — ABNORMAL HIGH (ref 65–99)
Potassium: 4.3 mmol/L (ref 3.5–5.1)
Sodium: 135 mmol/L (ref 135–145)
Total Bilirubin: 0.8 mg/dL (ref 0.3–1.2)
Total Protein: 5.7 g/dL — ABNORMAL LOW (ref 6.5–8.1)

## 2017-04-17 LAB — CBC WITH DIFFERENTIAL/PLATELET
Basophils Absolute: 0 10*3/uL (ref 0–0.1)
Basophils Relative: 3 %
Eosinophils Absolute: 0 10*3/uL (ref 0–0.7)
Eosinophils Relative: 3 %
HCT: 23.9 % — ABNORMAL LOW (ref 40.0–52.0)
Hemoglobin: 8 g/dL — ABNORMAL LOW (ref 13.0–18.0)
Lymphocytes Relative: 30 %
Lymphs Abs: 0.5 10*3/uL — ABNORMAL LOW (ref 1.0–3.6)
MCH: 29.6 pg (ref 26.0–34.0)
MCHC: 33.5 g/dL (ref 32.0–36.0)
MCV: 88.3 fL (ref 80.0–100.0)
Monocytes Absolute: 0.3 10*3/uL (ref 0.2–1.0)
Monocytes Relative: 20 %
Neutro Abs: 0.8 10*3/uL — ABNORMAL LOW (ref 1.4–6.5)
Neutrophils Relative %: 44 %
Platelets: 82 10*3/uL — ABNORMAL LOW (ref 150–440)
RBC: 2.7 MIL/uL — ABNORMAL LOW (ref 4.40–5.90)
RDW: 21 % — ABNORMAL HIGH (ref 11.5–14.5)
WBC: 1.6 10*3/uL — ABNORMAL LOW (ref 3.8–10.6)

## 2017-04-17 LAB — LACTATE DEHYDROGENASE: LDH: 379 U/L — ABNORMAL HIGH (ref 98–192)

## 2017-04-17 LAB — URIC ACID: Uric Acid, Serum: 3.9 mg/dL — ABNORMAL LOW (ref 4.4–7.6)

## 2017-04-17 MED ORDER — SODIUM CHLORIDE 0.9% FLUSH
10.0000 mL | Freq: Once | INTRAVENOUS | Status: AC
  Administered 2017-04-17: 10 mL via INTRAVENOUS
  Filled 2017-04-17: qty 10

## 2017-04-17 MED ORDER — HEPARIN SOD (PORK) LOCK FLUSH 100 UNIT/ML IV SOLN
INTRAVENOUS | Status: AC
  Filled 2017-04-17: qty 5

## 2017-04-17 MED ORDER — HEPARIN SOD (PORK) LOCK FLUSH 100 UNIT/ML IV SOLN
500.0000 [IU] | Freq: Once | INTRAVENOUS | Status: AC
  Administered 2017-04-17: 500 [IU] via INTRAVENOUS

## 2017-04-17 MED ORDER — HEPARIN SOD (PORK) LOCK FLUSH 100 UNIT/ML IV SOLN: 500.0000 [IU] | Freq: Once | INTRAVENOUS | Status: AC

## 2017-04-17 NOTE — Progress Notes (Signed)
[facility] day:  04/17/2017   Chief Complaint: Matthew Brown is a 76 y.o. male with post-transplant lymphoproliferative disorder, stage IVBE diffuse large B cell lymphoma, who is seen for assessment on day 9 of cycle #1 Rituxan and Revlimid.  HPI:  The patient was last seen in the medical oncology clinic by me on 03/27/2017.  At that time,  he felt "ok". Patient was eating better on Megace; weight up 1 pound. He denied any fevers or sweats.  RIGHT groin pain had resolved.  WBC was 4,000 with an [place] of 3,000. Hemoglobin was 8.1, hematocrit 23.8, platelets 160,000. Creatinine had improved to 1.16. Uric acid was down to 5.1. Potassium was 4.8.  He continued Revlimid.  He received the influenza vaccine.  He saw Dr. Lucky Cowboy on 04/07/2017 for evaluation of right arm AV fistula pain. His right arm AV fistula was thrombosed and it was unlikely that he would be able to perform a thrombectomy on the 76 year old right arm AV fistula. It was very unlikely he would develop a pulmonary emboli and no further anticoagulation was needed at this time. He recommended warm compresses and pain management for comfort.  He saw Faythe Casa, NP on 04/10/2017 for skin changes.  He was noted to have small raised pigmented areas.  Unscented emollients were suggested.  He had gained weight.  CBC revealed a hematocrit 25.2, hemoglobin 8.4, platelets 108,000, white count 1700 with an ANC of 600.  Creatinine was 1.17. Uric acid was 4.2. Neutropenia precautions were discussed.  During the interim, patient doing "ok". Patient is complaining of RIGHT knee pain. Pain is rated 8/10 today. He denies any B symptoms or interval infections. Patient continues to eat well. He states, "I get through eating and I am ready to eat again". Patient continues on Megace. He has gained 3 pounds.    Past Medical History:  Diagnosis Date  . Benign prostatic hypertrophy   . Chronic headache 10/19/2015  . ED  (erectile dysfunction)   . End stage renal disease ([place])   . Essential hypertension   . GERD (gastroesophageal reflux disease)   . GIB (gastrointestinal bleeding)    a. 11/8339 s/p R colic artery embolization;  b. 02/2015 EGD: duod ulcerative mass->Bx notable for coagulative necrosis - ? ischemia vs thrombosis-->coumadin d/c'd.  . Gout   . Hearing loss   . Hemorrhoids   . Hyperlipidemia   . Lymphoma ([place])   . Lymphoma ([place]) 2017  . Multiple thyroid nodules 06/06/2016   Noted on carotid US; dedicated US to be ordered by staff  . Osteoarthrosis, unspecified whether generalized or localized, lower leg   . Persistent atrial fibrillation ([place])    a. CHA2DS2VASc = 3-->coumadin d/c'd 02/2015 2/2 recurrent GIB.  [name] Prostatitis   . Pulmonary hypertension ([place])    a. 10/2014 Echo: EF 60-65%, mild to mod MR, mildly dil LA, nl RV, PASP 73mHg.  .Marland KitchenRenal transplant recipient   . Ulcers of both great toes ([facility]     Past Surgical History:  Procedure Laterality Date  . AV FISTULA PLACEMENT  1998  . BACK SURGERY    . ESOPHAGOGASTRODUODENOSCOPY  03/13/15   severe esophagitis, ulcerated mass  . ESOPHAGOGASTRODUODENOSCOPY (EGD) WITH PROPOFOL N/A 07/09/2016   Procedure: ESOPHAGOGASTRODUODENOSCOPY (EGD) WITH PROPOFOL;  Surgeon: KJonathon Bellows MD;  Location: [facility] ENDOSCOPY;  Service: Endoscopy;  Laterality: N/A;  . HERNIA REPAIR  1974  . KIDNEY TRANSPLANT  2006  . PERIPHERAL VASCULAR CATHETERIZATION N/A  05/07/2016   Procedure: Glori Luis Cath Insertion;  Surgeon: Algernon Huxley, MD;  Location: [facility];  Service: Cardiovascular;  Laterality: N/A;  . PROSTATE ABLATION    . STOMACH SURGERY     blood vessel burst  . THROAT SURGERY    . TOTAL KNEE ARTHROPLASTY      Family History  Problem Relation Age of Onset  . Cancer Mother        throat  . Diabetes Brother   . Heart disease Brother   . Stroke Brother   . Hypertension Brother   . Diabetes Sister   . Heart disease Sister   . Hypertension Sister    . Diabetes Sister   . Diabetes Brother   . COPD Neg Hx   . Kidney disease Neg Hx   . Prostate cancer Neg Hx   . Kidney cancer Neg Hx   . Bladder Cancer Neg Hx     Social History:  reports that he quit smoking about 38 years ago. His smoking use included Cigarettes. He has a 25.00 pack-year smoking history. He has never used smokeless tobacco. He reports that he does not drink alcohol or use drugs.  He stopped smoking in 1981.  He smoked 3 cigarettes/day.  He lives in [place].  The patient is accompanied by his wife, [name],  today.  Allergies: No Known Allergies  Current Medications: Current Outpatient Prescriptions  Medication Sig Dispense Refill  . acetaminophen (TYLENOL) 325 MG tablet Take 2 tablets (650 mg total) by mouth every 6 (six) hours as needed for mild pain (or Fever >/= 101).    [name] albuterol (PROAIR HFA) 108 (90 BASE) MCG/ACT inhaler Inhale 1-2 puffs into the lungs every 4 (four) hours as needed.     [name] allopurinol (ZYLOPRIM) 100 MG tablet Take 2 tablets (200 mg total) by mouth daily. 60 tablet 2  . COLCRYS 0.6 MG tablet Take 1 tablet by mouth 2 (two) times daily as needed (gout).     . finasteride (PROSCAR) 5 MG tablet Take 1 tablet (5 mg total) by mouth daily. 90 tablet 3  . furosemide (LASIX) 20 MG tablet Take 20 mg by mouth every other day.     . hydroxypropyl methylcellulose (ISOPTO TEARS) 2.5 % ophthalmic solution Place 1 drop into both eyes as needed.     [name] lenalidomide (REVLIMID) 15 MG capsule Take 15 mg by mouth daily. Take 1 capsule (15 mg total) by mouth every other day. for 21 days then off 7 days    . metoprolol tartrate (LOPRESSOR) 25 MG tablet Take 12.5 mg by mouth 2 (two) times daily.     . Multiple Vitamin (MULTIVITAMIN) tablet Take 1 tablet by mouth daily.      [name] oxybutynin (DITROPAN-XL) 5 MG 24 hr tablet Take 1 tablet (5 mg total) by mouth daily. 90 tablet 3  . oxyCODONE-acetaminophen (ROXICET) 5-325 MG tablet Take 1 tablet by mouth every 6 (six) hours as  needed. 30 tablet 0  . pantoprazole (PROTONIX) 40 MG tablet TAKE 1 TABLET BY MOUTH TWICE A DAY 60 tablet 1  . predniSONE (DELTASONE) 5 MG tablet Take 5 mg by mouth daily.     . simethicone (MYLICON) 80 MG chewable tablet Chew 1 tablet (80 mg total) by mouth every 6 (six) hours as needed for flatulence. 120 tablet 0  . tacrolimus (PROGRAF) 1 MG capsule Take 3 mg by mouth 2 (two) times daily. Reported on 08/16/2015    . tamsulosin (FLOMAX) 0.4 MG  CAPS capsule Take 1 capsule (0.4 mg total) by mouth daily. 90 capsule 3  . diphenhydrAMINE (BENADRYL) 25 mg capsule Take 1 capsule (25 mg total) by mouth at bedtime as needed for sleep. (Patient not taking: Reported on 04/10/2017) 30 capsule 0  . feeding supplement, ENSURE ENLIVE, (ENSURE ENLIVE) LIQD Take 237 mLs by mouth 3 (three) times daily between meals. (Patient not taking: Reported on 04/10/2017) 90 Bottle 0  . megestrol (MEGACE) 400 MG/10ML suspension Take 5 mLs (200 mg total) by mouth daily. (Patient not taking: Reported on 04/10/2017) 240 mL 0  . ondansetron (ZOFRAN) 4 MG tablet Take 1 tablet (4 mg total) by mouth every 6 (six) hours as needed for nausea. (Patient not taking: Reported on 04/10/2017) 20 tablet 0  . sucralfate (CARAFATE) 1 g tablet Take 1 tablet (1 g total) by mouth 4 (four) times daily. Resume taking after one week- once finished taking oral levaquine. ( to avoid interaction.) (Patient not taking: Reported on 04/10/2017) 40 tablet 3   No current facility-administered medications for this visit.    Facility-Administered Medications Ordered in Other Visits  Medication Dose Route Frequency Provider Last Rate Last Dose  . heparin lock flush 100 unit/mL  500 Units Intravenous Once Evaan Tidwell C, MD      . sodium chloride flush (NS) 0.9 % injection 10 mL  10 mL Intracatheter PRN Lequita Asal, MD        Review of Systems:  GENERAL:  Feels "ok".  No fevers or sweats.  Weight up 3 pounds.   PERFORMANCE STATUS (ECOG):   1 HEENT:  No visual changes, sore throat, mouth sores or tenderness. Lungs: No shortness of breath or cough.  No hemoptysis. Cardiac:  No chest pain, palpitations, orthopnea, or PND. GI:  Appetite improved on Megace.  Some diarrhea (see HPI).  No nausea, vomiting, constipation, melena or hematochezia. GU:  Enlarged prostate.  No urgency, frequency, dysuria, or hematuria.  Left inguinal hernia. Musculoskeletal: No back pain.  Right knee pain.  No muscle tenderness. Extremities:  No pain or swelling. Skin:  Nail changes.  No rashes or skin changes. Neuro:  Forgetful.  No headache, numbness or weakness, balance or coordination issues. Endocrine:  No diabetes, thyroid issues, hot flashes or night sweats. Psych:  No mood changes, depression or anxiety. Pain:  Right knee pain.   Review of systems:  All other systems reviewed and found to be negative.  Physical Exam: Blood pressure (!) 148/78, pulse (!) 49, temperature (!) 97.1 F (36.2 C), temperature source Tympanic, resp. rate 20, weight 127 lb 5 oz (57.7 kg). GENERAL:  Thin elderly gentleman sitting comfortably in the exam room in a wheelchair in no acute distress.  MENTAL STATUS:  Alert and oriented to person, place and time. HEAD:  [place]el.  Temporal wasting.  Normocephalic, atraumatic, face symmetric, no Cushingoid features. EYES:  Glasses.  Brown eyes.  Pupils equal round and reactive to light and accomodation.  No conjunctivitis or scleral icterus. ENT:  Oropharynx clear without lesion.  Edentulous.  Tongue normal. Mucous membranes moist.  RESPIRATORY:  Clear to auscultation without rales, wheezes or rhonchi. CARDIOVASCULAR:  Regular rate and rhythm without murmur, rub or gallop. ABDOMEN:  Soft, non-tender, with active bowel sounds, and no hepatosplenomegaly.  No masses.  Palpable transplanted kidney.  GU:  No groin pain on palpation. SKIN:  No rashes, ulcers or lesions. EXTREMITIES:  No edema, no skin discoloration or tenderness.   Right upper extremity with dilated vascular s/p  AV fistula, no thrill (chronic).  No palpable cords. NEUROLOGICAL: Unremarkable. PSYCH:  Appropriate.    Imaging studies:  04/28/2016 PET scan:  Bulky intensely hypermetabolic periaortic upper abdominal and mesenteric adenopathy concerning for high-grade lymphoma.  There was hypermetabolic liver metastasis.  There was hpermetabolic lesion involving the small bowel of the upper pelvis.  There were multiple sites of hypermetabolic skeletal metastasis. There was moderate volume right pneumothorax. 08/12/2016 PET scan:  Interval response to therapy. There has been significant decrease in extent of hypermetabolic tumor within the neck, chest, abdomen and pelvis as well as the axial and appendicular skeleton.  There was residual enlarged and hypermetabolic small bowel mesenteric and periaortic lymph nodes. There was a persistent hypermetabolic focus of increased uptake within the spleen.  There was resolution of previous multifocal hypermetabolic lesions within the liver. 10/10/2016 PET scan:  Slight worsening compared to the prior study. Several lesions were slightly more hypermetabolic and there appeared to be a newly enlarged and hypermetabolic mesenteric node adjacent to one of the previous mesenteric lymph nodes. However, there are no new hypermetabolic lesions in the neck, chest, or skeleton.  The small hypermetabolic peripheral lesion of the right kidney upper pole is slightly less hypermetabolic. 59/56/3875 PET scan:  Marked progression of disease as evidenced by progressive bulky mesenteric adenopathy, new low left internal jugular/left juxta diaphragmatic/abdominal retroperitoneal adenopathy, new pulmonary nodules, new hepatic and splenic lesions and new peritoneal nodules, all of which are hypermetabolic 64/33/2951 CT abdomen and pelvis: Slight increased size of left lower lobe pulmonary metastatic nodule. Re-demonstrated hypodense liver masses and  splenic masses, consistent with metastatic disease, suspect increased in size compared to prior PET-CT. Bulky mesenteric and retroperitoneal masses consistent with metastatic disease, also increased in size compared to recent PET-CT. Atrophic native kidneys. Transplant kidney in the right lower quadrant. Negative for hydronephrosis. 03/08/2017:  Scrotal ultrasound revealed soft tissue structure in the right scrotum separate from the epididymis and testicle. There was internal blood flow. The appearance could represent bowel herniated into the right side of the scrotum but there  was no peristalsis and there was no definitive hernia seen on a recent CT. 03/08/2017:  Abdomen CT revealed stable lingular and left lower lobe pulmonary metastatic nodules to the extent included.  There was slight interval increase in size of several hepatic metastatic lesions (4.3 cm vs 3.6 cm; 5.2 cm vs 4.9 cm).  There was equivocal overall change with reference to retroperitoneal and mesenteric lymphadenopathy, some areas appear to have increased in size such as the retrocaval lymph node mass (4.1 x 6.4 cm vs 6 x 3.1 cm), others remaining stable such as the left para-aortic and pelvic lymph node index masses and others appearing slightly smaller in the lower abdominal mesentery.   There was some mild nonspecific scrotal skin thickening.  There was no bowel herniation or definite intratesticular mass. Tubular densities were seen within the scrotum bilaterally which were likely related to the spermatic cord and vessels.   There was no significant hydrocele.   Infusion on 04/17/2017  Component Date Value Ref Range Status  . WBC 04/17/2017 1.6* 3.8 - 10.6 K/uL Final  . RBC 04/17/2017 2.70* 4.40 - 5.90 MIL/uL Final  . Hemoglobin 04/17/2017 8.0* 13.0 - 18.0 g/dL Final  . HCT 04/17/2017 23.9* 40.0 - 52.0 % Final  . MCV 04/17/2017 88.3  80.0 - 100.0 fL Final  . MCH 04/17/2017 29.6  26.0 - 34.0 pg Final  . MCHC 04/17/2017 33.5  32.0  - 36.0 g/dL  Final  . RDW 04/17/2017 21.0* 11.5 - 14.5 % Final  . Platelets 04/17/2017 82* 150 - 440 K/uL Final  . Neutrophils Relative % 04/17/2017 44  % Final  . Lymphocytes Relative 04/17/2017 30  % Final  . Monocytes Relative 04/17/2017 20  % Final  . Eosinophils Relative 04/17/2017 3  % Final  . Basophils Relative 04/17/2017 3  % Final  . Neutro Abs 04/17/2017 0.8* 1.4 - 6.5 K/uL Final  . Lymphs Abs 04/17/2017 0.5* 1.0 - 3.6 K/uL Final  . Monocytes Absolute 04/17/2017 0.3  0.2 - 1.0 K/uL Final  . Eosinophils Absolute 04/17/2017 0.0  0 - 0.7 K/uL Final  . Basophils Absolute 04/17/2017 0.0  0 - 0.1 K/uL Final  . Smear Review 04/17/2017 SMEAR SCANNED   Final  . Sodium 04/17/2017 135  135 - 145 mmol/L Final  . Potassium 04/17/2017 4.3  3.5 - 5.1 mmol/L Final  . Chloride 04/17/2017 107  101 - 111 mmol/L Final  . CO2 04/17/2017 21* 22 - 32 mmol/L Final  . Glucose, Bld 04/17/2017 107* 65 - 99 mg/dL Final  . BUN 04/17/2017 22* 6 - 20 mg/dL Final  . Creatinine, Ser 04/17/2017 1.11  0.61 - 1.24 mg/dL Final  . Calcium 04/17/2017 9.5  8.9 - 10.3 mg/dL Final  . Total Protein 04/17/2017 5.7* 6.5 - 8.1 g/dL Final  . Albumin 04/17/2017 3.5  3.5 - 5.0 g/dL Final  . AST 04/17/2017 27  15 - 41 U/L Final  . ALT 04/17/2017 13* 17 - 63 U/L Final  . Alkaline Phosphatase 04/17/2017 71  38 - 126 U/L Final  . Total Bilirubin 04/17/2017 0.8  0.3 - 1.2 mg/dL Final  . GFR calc non Af Amer 04/17/2017 >60  >60 mL/min Final  . GFR calc Af Amer 04/17/2017 >60  >60 mL/min Final   Comment: (NOTE) The eGFR has been calculated using the CKD EPI equation. This calculation has not been validated in all clinical situations. eGFR's persistently <60 mL/min signify possible Chronic Kidney Disease.   . Anion gap 04/17/2017 7  5 - 15 Final  . LDH 04/17/2017 379* 98 - 192 U/L Final    Assessment:  BRANDOM KERWIN is a 76 y.o. male s/p renal transplant (2007) with a post-transplant lymphoproliferative disorder, stage IV  diffuse large B cell lymphoma.  He presented with a 2-3 month history of progressive back pain superimposed on chronic back pain.    PET scan on 04/28/2016 revealed bulky intensely hypermetabolic periaortic upper abdominal and mesenteric adenopathy concerning for high-grade lymphoma.  There was hypermetabolic liver metastasis.  There was hpermetabolic lesion involving the small bowel of the upper pelvis.  There were multiple sites of hypermetabolic skeletal metastasis. There was moderate volume right pneumothorax.  CT guided retroperitoneal node biopsy on 04/30/2016 revealed diffuse large B cell lymphoma.  Hepatitis B and C testing were negative on 05/06/2016 and 02/03/2017.  Echo on 05/06/2016 revealed an EF of 55-60%.  Echo on 09/01/2016 revealed an EF of 60-65%.  Bone marrow aspirate and biopsy on 05/08/2016 revealed multifocal marrow involvement by diffuse large B-cell lymphoma. There was variably cellular marrow for age (50% - 90%) with a patchy predominantly nodular large B-cell infiltrate, overall estimated to account for 20% of the core biopsy. There was adequate residual trilineage hematopoiesis with mild nonspecific dyserythropoiesis. There was patchy mild increase in reticulin. Storage iron was present. The immunohistochemical staining pattern of the B-cell infiltrate (CD10 +/-, BCL 6+, BCL-2 +) suggested possible large cell transformation  of follicular lymphoma.  Flow cytometry revealed no significant immunophenotypic abnormalities or evidence of B-cell lymphoma.  He has bone metastasis and hypercalcemia.  Calcium was 11.2 (ionized 7.3) on 05/13/2016.  He received Zometa on 05/13/2016.  He began [place] on 06/09/2016 (last 08/05/2016).  Work-up on 04/15/2016 revealed the following normal studies: ferritin (343), iron saturation (6%), TIBC (241; low), B12 (525), folate (27).  Reticulocyte count was 2%.  LDH was 409.  Uric acid was 7.7 (4.4 - 7.6).  He was admitted at [facility] from 04/28/2016 -  04/30/2016 with a moderate volume right sided pneumothorax.  His pneumothorax improved spontaneously. Plain films of the right femur revealed the lucent bone lesion in the right femoral neck  was poorly characterized.  There was no evidence for an acute fracture.  PTH was 106 (high) 04/30/2016 with a calcium of 11.2.  Etiology was c/w primary hyperparathyroidism.  PTH-related polypeptide was < 1.1 on 04/30/2016.  He has a history of GI bleeding in 08/2014.  He underwent tagged RBC scan which revealed an active bleed in the hepatic flexure.  He was embolized in vascular interventional radiology.  He was admitted to [facility] from 07/08/2016 - 07/13/2016 with GI bleeding.  EGD on 07/09/2016 revealed multiple non-bleeding duodenal ulcer as well as duodenitis. Tagged RBC scan on 07/11/2016 revealed active GI bleed in the lateral right mid abdomen coursing through multiple curvilinear bowel loops in the right mid abdomen favoring a small bowel source of bleeding.  He received 5 units of PRBCs, 2 units of pheresed platelets, and vitamin K from 07/07/2016 0 07/13/2016.  He was transferred to [facility] from 07/13/2016 - 07/16/2016.  [facility] performed a colonoscopy and push enteroscopy which showed several diverticula and old blood but no source of bleeding. Source of bleed was presumed to be diverticular, although it was possible a small bowel site was not visualized on endoscopy.  He has a history of renal failure s/p renal transplant.  He is tacrolimus (Prograf), and steroids  Creatinine has ranged between 1.43 - 1.93 in the past 6 months. Mycophenolate (MMF) was discontinued at diagnosis.  Prograf level was 5.5 (3.0-8.0) on 05/08/2016, 2.2 on 08/13/2016, 2.3 in 01/01/2017.  He received 6 cycles of mini-RCHOP (05/09/2016 - 09/05/2016).  Cycle #1 was complicated by fever and neutropenia.  Cycle #3 was complicated by a GI bleed.  CSF on 06/19/2016 revealed 7 WBCs (4% segs, 79% lymphs, and 17% monocytes).  He has undergone  LP with IT MTX x 4 (07/24/2016, 08/14/2016, 09/04/2016, and 10/02/2016).  Cytology was negative on 07/24/2016 and 09/04/2016.  PET scan on 10/10/2016 revealed slight worsening compared to the prior study. Several lesions were slightly more hypermetabolic and there appeared to be a newly enlarged and hypermetabolic mesenteric node adjacent to one of the previous mesenteric lymph nodes. However, there are no new hypermetabolic lesions in the neck, chest, or skeleton.  The small hypermetabolic peripheral lesion of the right kidney upper pole is slightly less hypermetabolic.  He was lost to follow-up.  PET scan on 01/14/2017 revealed marked progression of disease as evidenced by progressive bulky mesenteric adenopathy, new low left internal jugular/left juxta diaphragmatic/abdominal retroperitoneal adenopathy, new pulmonary nodules, new hepatic and splenic lesions and new peritoneal nodules, all of which are hypermetabolic.  LDH was 480 on 02/06/2017.  He received 2 cycles of gemcitabine and oxaliplatin + obinutuzumab (02/04/2017 - 02/24/2017) with Neulasta support.   Abdomen CT on 03/08/2017 revealed stable lingular and left lower lobe pulmonary metastatic nodules.  There was  slight interval increase in size of several hepatic metastatic lesions (4.3 cm vs 3.6 cm; 5.2 cm vs 4.9 cm).  There was equivocal overall change with reference to retroperitoneal and mesenteric lymphadenopathy, some areas appear to have increased in size such as the retrocaval lymph node mass (4.1 x 6.4 cm vs 6 x 3.1 cm), others remaining stable such as the left para-aortic and pelvic lymph node index masses and others appearing slightly smaller in the lower abdominal mesentery.   There was some mild nonspecific scrotal skin thickening.  Tubular densities within the scrotum bilaterally were likely related to the spermatic cord and vessels.   There was no significant hydrocele.  He is day 29 of cycle #1 Rituxan and Revlimid  (03/20/2017).  Symptomatically, he feels "ok". Patient eating better on Megace; weight up 3 pounds. He denies any fevers or sweats.  Patient has RIGHT knee pain  WBC is 1600 with an [place] of 800. Hemoglobin 8.0, hematocrit 23.9, platelets 82,000. Creatinine has improved to 1.11.   Plan:  1.  Labs today:  CBC with diff, CMP, LDH, uric acid. 2.  Hold Megace to assess whether or not patient continues to eat well without appetite stimulating medications.  3.  Discuss neutropenia. WBC 1600 with an ANC of 800. Reinforced neutropenic precautions.  4.  No Rituxan today due to neutropenia.  5.  Hold Revlimid due to neutropenia. If patient's counts do not recover, we will change to 5 mg daily for 21 days with 7 days off (or 15 mg every 3 days if cannot return Revlimid). 6.  RTC in 1 week for MD assessment, labs (CBC with diff, CMP, LDH, uric acid), and cycle #2 Rituxin.    Honor Loh, NP 04/17/2017,9:10 AM    I saw and evaluated the patient, participating in the key portions of the service and reviewing pertinent diagnostic studies and records.  I reviewed the nurse practitioner's note and agree with the findings and the plan.  The assessment and plan were discussed with the patient.  Multiple questions were asked by the patient and answered.   Lequita Asal, MD 04/17/2017,9:10 AM

## 2017-04-17 NOTE — ED Notes (Addendum)
Port in right chest de-accessed according to protocol. Patient tolerated procedure well. NAD.

## 2017-04-17 NOTE — Progress Notes (Signed)
Patient is eating well. Sleeping good.  Pt c/o right knee pain.

## 2017-04-17 NOTE — ED Triage Notes (Signed)
Patient had temporary port placed today for chem, did not have treatment. Patient sent here by PCP to have port removed.

## 2017-04-17 NOTE — ED Provider Notes (Signed)
[facility] Emergency Department Provider Note  ____________________________________________  Time seen: Approximately 8:34 PM  I have reviewed the triage vital signs and the nursing notes.   HISTORY  Chief Complaint Port problem    HPI Matthew Brown is a 76 y.o. male presents the emergency department to have his Port-A-Cath de-accessed.  Patient was seen in the cancer center and was scheduled for chemo treatment.  Patient reports that he did not have the treatment and they forgot his port was accessed and sent him home.  Patient called his primary care and advised him to come to the emergency department to have the port de-accessed.  Patient denies any complaints or problems at this time.  Past Medical History:  Diagnosis Date  . Benign prostatic hypertrophy   . Chronic headache 10/19/2015  . ED (erectile dysfunction)   . End stage renal disease ([place])   . Essential hypertension   . GERD (gastroesophageal reflux disease)   . GIB (gastrointestinal bleeding)    a. 01/2422 s/p R colic artery embolization;  b. 02/2015 EGD: duod ulcerative mass->Bx notable for coagulative necrosis - ? ischemia vs thrombosis-->coumadin d/c'd.  . Gout   . Hearing loss   . Hemorrhoids   . Hyperlipidemia   . Lymphoma ([place])   . Lymphoma ([place]) 2017  . Multiple thyroid nodules 06/06/2016   Noted on carotid US; dedicated US to be ordered by staff  . Osteoarthrosis, unspecified whether generalized or localized, lower leg   . Persistent atrial fibrillation ([place])    a. CHA2DS2VASc = 3-->coumadin d/c'd 02/2015 2/2 recurrent GIB.  [name] Prostatitis   . Pulmonary hypertension ([place])    a. 10/2014 Echo: EF 60-65%, mild to mod MR, mildly dil LA, nl RV, PASP 79mmHg.  [name] Renal transplant recipient   . Ulcers of both great toes [facility])     Patient Active Problem List   Diagnosis Date Noted  . Complication of vascular access for dialysis 04/07/2017  . Spermatic cord inflammation 02/24/2017  .  Thrombocytopenia ([place]) 02/14/2017  . Chemotherapy-induced neutropenia ([place]) 02/14/2017  . Malignant neoplasm metastatic to lung ([place]) 02/03/2017  . 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 Diffuse large B-cell lymphoma of extranodal site excluding spleen and other solid organs ([place]) 01/30/2017  . Encounter for antineoplastic immunotherapy 09/03/2016  . Encounter for anticoagulation discussion and counseling 08/31/2016  . Acute upper GI bleed   . Cancer ([place])   . Palliative care by specialist   . DNR (do not resuscitate) discussion   . Goals of care, counseling/discussion   . GIB (gastrointestinal bleeding) 07/08/2016  . Encounter for antineoplastic chemotherapy 07/07/2016  . Gastroenteritis 06/25/2016  . Multiple thyroid nodules 06/06/2016  . Liver metastases ([place]) 05/26/2016  . Bone metastasis ([place]) 05/26/2016  . Non-intractable vomiting with nausea   . Diarrhea   . Neutropenic fever ([place]) 05/18/2016  . Carotid atherosclerosis, bilateral 05/09/2016  . Protein-calorie malnutrition, severe 05/08/2016  . Hypercalcemia 05/06/2016  . Renal transplant recipient 05/06/2016  . Post-transplant lymphoproliferative disorder ([place]) 05/06/2016  . Tumor lysis syndrome 05/06/2016  . High serum parathyroid hormone (PTH) 05/03/2016  . Pain in joint involving pelvic region and thigh 05/03/2016  . Diffuse large B cell lymphoma ([place]) 04/30/2016  . Abnormal positron emission tomography (PET) scan   . Anemia 04/29/2016  . Lymphoma ([place]) 04/28/2016  . Pneumothorax 04/28/2016  . Hyponatremia 04/28/2016  . Adenopathy 04/15/2016  . Bone disease 04/15/2016  . Weight loss 04/15/2016  . Spinal stenosis of cervical region  11/21/2015  . Pleural effusion, right 11/21/2015  . Right inguinal pain 10/19/2015  . Chronic headache 10/19/2015  . Hip strain 10/09/2015  . BPH with obstruction/lower urinary tract symptoms 09/03/2015  . Prostatitis, chronic 09/03/2015  . Erectile dysfunction of organic origin  09/03/2015  . Difficulty urinating 08/14/2015  . Accumulation of fluid in tissues 07/18/2015  . Persistent atrial fibrillation ([place])   . Pain in finger of right hand 06/15/2015  . Eustachian tube dysfunction, left 06/06/2015  . Chronic insomnia 06/06/2015  . H/O: upper GI bleed 06/06/2015  . Pleural cavity effusion 04/07/2015  . Abdominal pain, generalized 04/04/2015  . Pneumonia due to Haemophilus influenzae ([place]) 03/16/2015  . Encounter for therapeutic drug monitoring 07/27/2013  . Long term (current) use of anticoagulants 09/25/2010  . Hyperlipidemia 10/25/2008  . Essential hypertension 10/25/2008  . ATRIAL FIBRILLATION 10/25/2008  . GERD 10/25/2008  . RENAL FAILURE, END STAGE 10/25/2008  . Osteoarthrosis, unspecified whether generalized or localized, involving lower leg 10/25/2008  . BENIGN PROSTATIC HYPERTROPHY, HX OF 10/25/2008    Past Surgical History:  Procedure Laterality Date  . AV FISTULA PLACEMENT  1998  . BACK SURGERY    . ESOPHAGOGASTRODUODENOSCOPY  03/13/15   severe esophagitis, ulcerated mass  . ESOPHAGOGASTRODUODENOSCOPY (EGD) WITH PROPOFOL N/A 07/09/2016   Procedure: ESOPHAGOGASTRODUODENOSCOPY (EGD) WITH PROPOFOL;  Surgeon: [name], MD;  Location: [facility] ENDOSCOPY;  Service: Endoscopy;  Laterality: N/A;  . HERNIA REPAIR  1974  . KIDNEY TRANSPLANT  2006  . PERIPHERAL VASCULAR CATHETERIZATION N/A 05/07/2016   Procedure: Glori Luis Cath Insertion;  Surgeon: Algernon Huxley, MD;  Location: [place] CV LAB;  Service: Cardiovascular;  Laterality: N/A;  . PROSTATE ABLATION    . STOMACH SURGERY     blood vessel burst  . THROAT SURGERY    . TOTAL KNEE ARTHROPLASTY      Prior to Admission medications   Medication Sig Start Date End Date Taking? Authorizing Provider  acetaminophen (TYLENOL) 325 MG tablet Take 2 tablets (650 mg total) by mouth every 6 (six) hours as needed for mild pain (or Fever >/= 101). 05/13/16   Gouru, Illene Silver, MD  albuterol (PROAIR HFA) 108 (90 BASE)  MCG/ACT inhaler Inhale 1-2 puffs into the lungs every 4 (four) hours as needed.  02/20/14   [provider]  allopurinol (ZYLOPRIM) 100 MG tablet Take 2 tablets (200 mg total) by mouth daily. 03/20/17   Karen Kitchens, NP  COLCRYS 0.6 MG tablet Take 1 tablet by mouth 2 (two) times daily as needed (gout).  05/03/16   [provider]  diphenhydrAMINE (BENADRYL) 25 mg capsule Take 1 capsule (25 mg total) by mouth at bedtime as needed for sleep. Patient not taking: Reported on 04/10/2017 05/13/16   Nicholes Mango, MD  feeding supplement, ENSURE ENLIVE, (ENSURE ENLIVE) LIQD Take 237 mLs by mouth 3 (three) times daily between meals. Patient not taking: Reported on 04/10/2017 05/13/16   Nicholes Mango, MD  finasteride (PROSCAR) 5 MG tablet Take 1 tablet (5 mg total) by mouth daily. 09/02/16   Zara Council A, PA-C  furosemide (LASIX) 20 MG tablet Take 20 mg by mouth every other day.  07/22/16 07/22/17  [provider]  hydroxypropyl methylcellulose (ISOPTO TEARS) 2.5 % ophthalmic solution Place 1 drop into both eyes as needed.     [provider]  lenalidomide (REVLIMID) 15 MG capsule Take 15 mg by mouth daily. Take 1 capsule (15 mg total) by mouth every other day. for 21 days then off 7 days  [provider]  megestrol (MEGACE) 400 MG/10ML suspension Take 5 mLs (200 mg total) by mouth daily. Patient not taking: Reported on 04/10/2017 03/12/17   Karen Kitchens, NP  metoprolol tartrate (LOPRESSOR) 25 MG tablet Take 12.5 mg by mouth 2 (two) times daily.  11/04/16 11/04/17  [provider]  Multiple Vitamin (MULTIVITAMIN) tablet Take 1 tablet by mouth daily.      [provider]  ondansetron (ZOFRAN) 4 MG tablet Take 1 tablet (4 mg total) by mouth every 6 (six) hours as needed for nausea. Patient not taking: Reported on 04/10/2017 05/22/16   Vaughan Basta, MD  oxybutynin (DITROPAN-XL) 5 MG 24 hr tablet Take 1 tablet (5 mg total) by mouth daily.  09/02/16   McGowan, Hunt Oris, PA-C  oxyCODONE-acetaminophen (ROXICET) 5-325 MG tablet Take 1 tablet by mouth every 6 (six) hours as needed. 03/19/17   Karen Kitchens, NP  pantoprazole (PROTONIX) 40 MG tablet TAKE 1 TABLET BY MOUTH TWICE A DAY 01/07/17   Nolon Stalls C, MD  predniSONE (DELTASONE) 5 MG tablet Take 5 mg by mouth daily.  11/23/13   [provider]  simethicone (MYLICON) 80 MG chewable tablet Chew 1 tablet (80 mg total) by mouth every 6 (six) hours as needed for flatulence. 06/28/16   Loletha Grayer, MD  sucralfate (CARAFATE) 1 g tablet Take 1 tablet (1 g total) by mouth 4 (four) times daily. Resume taking after one week- once finished taking oral levaquine. ( to avoid interaction.) Patient not taking: Reported on 04/10/2017 05/22/16   Vaughan Basta, MD  tacrolimus (PROGRAF) 1 MG capsule Take 3 mg by mouth 2 (two) times daily. Reported on 08/16/2015    [provider]  tamsulosin (FLOMAX) 0.4 MG CAPS capsule Take 1 capsule (0.4 mg total) by mouth daily. 09/02/16   Zara Council A, PA-C    Allergies Patient has no known allergies.  Family History  Problem Relation Age of Onset  . Cancer Mother        throat  . Diabetes Brother   . Heart disease Brother   . Stroke Brother   . Hypertension Brother   . Diabetes Sister   . Heart disease Sister   . Hypertension Sister   . Diabetes Sister   . Diabetes Brother   . COPD Neg Hx   . Kidney disease Neg Hx   . Prostate cancer Neg Hx   . Kidney cancer Neg Hx   . Bladder Cancer Neg Hx     Social History Social History  Substance Use Topics  . Smoking status: Former Smoker    Packs/day: 1.00    Years: 25.00    Types: Cigarettes    Quit date: 06/23/1978  . Smokeless tobacco: Never Used  . Alcohol use No     Review of Systems  Constitutional: No fever/chills Eyes: No visual changes. No discharge ENT: No upper respiratory complaints. Cardiovascular: no chest pain. Respiratory: no cough. No  SOB. Gastrointestinal: No abdominal pain.  No nausea, no vomiting.   Musculoskeletal: Negative for musculoskeletal pain. Skin: Negative for rash, abrasions, lacerations, ecchymosis. Neurological: Negative for headaches, focal weakness or numbness. 10-point ROS otherwise negative.  ____________________________________________   PHYSICAL EXAM:  VITAL SIGNS: ED Triage Vitals  Enc Vitals Group     BP 04/17/17 2013 (!) 137/45     Pulse --      Resp 04/17/17 2013 15     Temp 04/17/17 2013 97.8 F (36.6 C)     Temp Source  04/17/17 2013 Oral     SpO2 04/17/17 2013 99 %     Weight --      Height --      Head Circumference --      Peak Flow --      Pain Score 04/17/17 2017 0     Pain Loc --      Pain Edu? --      Excl. in [place]? --      Constitutional: Alert and oriented. Well appearing and in no acute distress. Eyes: Conjunctivae are normal. PERRL. EOMI. Head: Atraumatic. ENT:      Ears:       Nose: No congestion/rhinnorhea.      Mouth/Throat: Mucous membranes are moist.  Neck: No stridor.    Cardiovascular: Normal rate, regular rhythm. Normal S1 and S2.  Good peripheral circulation.  Patient has Port-A-Cath placed right upper chest wall.  This is accessed.  No surrounding erythema or edema. Respiratory: Normal respiratory effort without tachypnea or retractions. Lungs CTAB. Good air entry to the bases with no decreased or absent breath sounds. Musculoskeletal: Full range of motion to all extremities. No gross deformities appreciated. Neurologic:  Normal speech and language. No gross focal neurologic deficits are appreciated.  Skin:  Skin is warm, dry and intact. No rash noted. Psychiatric: Mood and affect are normal. Speech and behavior are normal. Patient exhibits appropriate insight and judgement.   ____________________________________________   LABS (all labs ordered are listed, but only abnormal results are displayed)  Labs Reviewed - No data to  display ____________________________________________  EKG   ____________________________________________  RADIOLOGY   No results found.  ____________________________________________    PROCEDURES  Procedure(s) performed:    Procedures    Medications  heparin lock flush 100 UNIT/ML injection (not administered)  heparin lock flush 100 unit/mL (500 Units Intravenous Given 04/17/17 2047)     ____________________________________________   INITIAL IMPRESSION / ASSESSMENT AND PLAN / ED COURSE  Pertinent labs & imaging results that were available during my care of the patient were reviewed by me and considered in my medical decision making (see chart for details).  Review of the [place] CSRS was performed in accordance of the [place] prior to dispensing any controlled drugs.     Patient's diagnosis is consistent with the accessing of Port-A-Cath.  Patient had presented to the emergency department to have [place] to access his port.  Patient is an oncology patient and was seen at [place] today.  He was originally scheduled for treatment but then did not have same.  Patient left cancer center with his port still accessed.  He is here for removal of access only.  No complaints.  Port is flushed with heparin and patient is discharged after it is de-accessed.  Patient will follow up with oncology as needed. patient is given ED precautions to return to the ED for any worsening or new symptoms.     ____________________________________________  FINAL CLINICAL IMPRESSION(S) / ED DIAGNOSES  Final diagnoses:  Port-A-Cath in place  Problem with vascular access      NEW MEDICATIONS STARTED DURING THIS VISIT:  New Prescriptions   No medications on file        This chart was dictated using voice recognition software/Dragon. Despite best efforts to proofread, errors can occur which can change the meaning. Any change was purely unintentional.    Darletta Moll,  PA-C 04/17/17 2050    Nena Polio, MD 04/18/17 [phone]

## 2017-04-17 NOTE — Telephone Encounter (Signed)
Oral Oncology Patient Advocate Encounter  Patient was seen today and brought his medication with him. Dr. Mike Gip may want to change his dosage. She is going to wait to start him after his next visit. The medication and package has not been opened. I called [facility] to see if we can return if the dosage does change. Waiting to hear back from Dr. Mike Gip what she wants to do.   We have his medication in our office until she decides what we are going to do. We may need to return to [facility].   Will call [facility] again to see if we can return.    [place] Patient Advocate [phone] 04/17/2017 1:13 PM

## 2017-04-17 NOTE — Unmapped (Signed)
[facility] Refill Coordination Note  Specialty Medication(s): Prograf 1mg     Taylor Ramos, DOB: 04/19/1941  Phone: [phone] (home) , Alternate phone contact: N/A  Phone or address changes today?: No  All above HIPAA information was verified with patient.  Shipping Address: [address]  [place] [address]   Insurance changes? No    Completed refill call assessment today to schedule patient's medication shipment from the [facility] [phone]).      Confirmed the medication and dosage are correct and have not changed: Yes, regimen is correct and unchanged.    Confirmed patient started or stopped the following medications in the past month:  No, there are no changes reported at this time.    Are you tolerating your medication?:  Taylor Ramos reports tolerating the medication.    ADHERENCE    (Below is required for Medicare Part B or Transplant patients only - per drug):   How many tablets were dispensed last month:  Prograf 1 mg   Quantity filled last month: 180   # of tablets left on hand: 7 days    Did you miss any doses in the past 4 weeks? No missed doses reported.    FINANCIAL/SHIPPING    Delivery Scheduled: Yes, Expected medication delivery date: 04/21/2017     Taylor Ramos did not have any additional questions at this time.    Delivery address validated in [facility] scheduling system: Yes, address listed in [facility] is correct.    We will follow up with patient monthly for standard refill processing and delivery.      Thank you,  Tamala Fothergill   [facility] Specialty Technician

## 2017-04-19 ENCOUNTER — Encounter: Payer: Self-pay | Admitting: Hematology and Oncology

## 2017-04-19 MED ORDER — LENALIDOMIDE 5 MG PO CAPS: 5.0000 mg | ORAL_CAPSULE | Freq: Every day | ORAL | 0 refills | Status: DC

## 2017-04-19 MED FILL — PROGRAF/1MG/CAP: PROGRAF/1MG/CAP | 30 days supply | Qty: 180 | Fill #3

## 2017-04-20 NOTE — Unmapped (Signed)
Patient request for RX refill.

## 2017-04-20 NOTE — Unmapped (Signed)
Renew [facility] orders

## 2017-04-21 MED ORDER — ALLOPURINOL 100 MG TABLET
ORAL_TABLET | Freq: Every day | ORAL | 3 refills | 0 days | Status: SS
Start: 2017-04-21 — End: 2017-07-24

## 2017-04-24 ENCOUNTER — Other Ambulatory Visit: Payer: Self-pay | Admitting: *Deleted

## 2017-04-24 ENCOUNTER — Inpatient Hospital Stay (HOSPITAL_BASED_OUTPATIENT_CLINIC_OR_DEPARTMENT_OTHER): Payer: Medicare HMO | Admitting: Hematology and Oncology

## 2017-04-24 ENCOUNTER — Inpatient Hospital Stay: Payer: Medicare HMO

## 2017-04-24 ENCOUNTER — Inpatient Hospital Stay: Payer: Medicare HMO | Attending: Hematology and Oncology

## 2017-04-24 ENCOUNTER — Other Ambulatory Visit: Payer: Self-pay | Admitting: Hematology and Oncology

## 2017-04-24 VITALS — BP 158/69 | HR 48 | Temp 97.4°F | Resp 20 | Wt 128.4 lb

## 2017-04-24 DIAGNOSIS — E210 Primary hyperparathyroidism: Secondary | ICD-10-CM | POA: Diagnosis not present

## 2017-04-24 DIAGNOSIS — E785 Hyperlipidemia, unspecified: Secondary | ICD-10-CM

## 2017-04-24 DIAGNOSIS — M109 Gout, unspecified: Secondary | ICD-10-CM | POA: Diagnosis not present

## 2017-04-24 DIAGNOSIS — E041 Nontoxic single thyroid nodule: Secondary | ICD-10-CM | POA: Insufficient documentation

## 2017-04-24 DIAGNOSIS — Z800 Family history of malignant neoplasm of digestive organs: Secondary | ICD-10-CM | POA: Diagnosis not present

## 2017-04-24 DIAGNOSIS — R232 Flushing: Secondary | ICD-10-CM | POA: Insufficient documentation

## 2017-04-24 DIAGNOSIS — I4891 Unspecified atrial fibrillation: Secondary | ICD-10-CM | POA: Diagnosis not present

## 2017-04-24 DIAGNOSIS — I272 Pulmonary hypertension, unspecified: Secondary | ICD-10-CM | POA: Diagnosis not present

## 2017-04-24 DIAGNOSIS — K449 Diaphragmatic hernia without obstruction or gangrene: Secondary | ICD-10-CM | POA: Insufficient documentation

## 2017-04-24 DIAGNOSIS — R51 Headache: Secondary | ICD-10-CM | POA: Diagnosis not present

## 2017-04-24 DIAGNOSIS — D649 Anemia, unspecified: Secondary | ICD-10-CM

## 2017-04-24 DIAGNOSIS — K2980 Duodenitis without bleeding: Secondary | ICD-10-CM

## 2017-04-24 DIAGNOSIS — I129 Hypertensive chronic kidney disease with stage 1 through stage 4 chronic kidney disease, or unspecified chronic kidney disease: Secondary | ICD-10-CM

## 2017-04-24 DIAGNOSIS — N186 End stage renal disease: Secondary | ICD-10-CM | POA: Diagnosis not present

## 2017-04-24 DIAGNOSIS — K219 Gastro-esophageal reflux disease without esophagitis: Secondary | ICD-10-CM | POA: Diagnosis not present

## 2017-04-24 DIAGNOSIS — C787 Secondary malignant neoplasm of liver and intrahepatic bile duct: Secondary | ICD-10-CM

## 2017-04-24 DIAGNOSIS — K4020 Bilateral inguinal hernia, without obstruction or gangrene, not specified as recurrent: Secondary | ICD-10-CM | POA: Insufficient documentation

## 2017-04-24 DIAGNOSIS — C7800 Secondary malignant neoplasm of unspecified lung: Secondary | ICD-10-CM

## 2017-04-24 DIAGNOSIS — Z940 Kidney transplant status: Secondary | ICD-10-CM | POA: Insufficient documentation

## 2017-04-24 DIAGNOSIS — M1990 Unspecified osteoarthritis, unspecified site: Secondary | ICD-10-CM | POA: Insufficient documentation

## 2017-04-24 DIAGNOSIS — M25561 Pain in right knee: Secondary | ICD-10-CM | POA: Insufficient documentation

## 2017-04-24 DIAGNOSIS — R200 Anesthesia of skin: Secondary | ICD-10-CM | POA: Insufficient documentation

## 2017-04-24 DIAGNOSIS — Z79899 Other long term (current) drug therapy: Secondary | ICD-10-CM

## 2017-04-24 DIAGNOSIS — C8330 Diffuse large B-cell lymphoma, unspecified site: Secondary | ICD-10-CM

## 2017-04-24 DIAGNOSIS — D709 Neutropenia, unspecified: Secondary | ICD-10-CM | POA: Insufficient documentation

## 2017-04-24 DIAGNOSIS — N400 Enlarged prostate without lower urinary tract symptoms: Secondary | ICD-10-CM

## 2017-04-24 DIAGNOSIS — D72819 Decreased white blood cell count, unspecified: Secondary | ICD-10-CM

## 2017-04-24 DIAGNOSIS — Z87891 Personal history of nicotine dependence: Secondary | ICD-10-CM | POA: Insufficient documentation

## 2017-04-24 DIAGNOSIS — C7889 Secondary malignant neoplasm of other digestive organs: Secondary | ICD-10-CM

## 2017-04-24 DIAGNOSIS — R634 Abnormal weight loss: Secondary | ICD-10-CM | POA: Diagnosis not present

## 2017-04-24 DIAGNOSIS — Z7189 Other specified counseling: Secondary | ICD-10-CM

## 2017-04-24 LAB — CBC WITH DIFFERENTIAL/PLATELET
Basophils Absolute: 0 10*3/uL (ref 0–0.1)
Basophils Relative: 2 %
Eosinophils Absolute: 0.1 10*3/uL (ref 0–0.7)
Eosinophils Relative: 5 %
HCT: 22.5 % — ABNORMAL LOW (ref 40.0–52.0)
Hemoglobin: 7.6 g/dL — ABNORMAL LOW (ref 13.0–18.0)
Lymphocytes Relative: 20 %
Lymphs Abs: 0.3 10*3/uL — ABNORMAL LOW (ref 1.0–3.6)
MCH: 29.1 pg (ref 26.0–34.0)
MCHC: 33.7 g/dL (ref 32.0–36.0)
MCV: 86.5 fL (ref 80.0–100.0)
Monocytes Absolute: 0.2 10*3/uL (ref 0.2–1.0)
Monocytes Relative: 13 %
Neutro Abs: 1.1 10*3/uL — ABNORMAL LOW (ref 1.4–6.5)
Neutrophils Relative %: 60 %
Platelets: 104 10*3/uL — ABNORMAL LOW (ref 150–440)
RBC: 2.61 MIL/uL — ABNORMAL LOW (ref 4.40–5.90)
RDW: 19.6 % — ABNORMAL HIGH (ref 11.5–14.5)
WBC: 1.7 10*3/uL — ABNORMAL LOW (ref 3.8–10.6)

## 2017-04-24 LAB — COMPREHENSIVE METABOLIC PANEL
ALT: 15 U/L — ABNORMAL LOW (ref 17–63)
AST: 27 U/L (ref 15–41)
Albumin: 3.3 g/dL — ABNORMAL LOW (ref 3.5–5.0)
Alkaline Phosphatase: 85 U/L (ref 38–126)
Anion gap: 3 — ABNORMAL LOW (ref 5–15)
BUN: 16 mg/dL (ref 6–20)
CO2: 23 mmol/L (ref 22–32)
Calcium: 9.7 mg/dL (ref 8.9–10.3)
Chloride: 106 mmol/L (ref 101–111)
Creatinine, Ser: 0.89 mg/dL (ref 0.61–1.24)
GFR calc Af Amer: >60 mL/min (ref >60)
GFR calc non Af Amer: >60 mL/min (ref >60)
Glucose, Bld: 113 mg/dL — ABNORMAL HIGH (ref 65–99)
Potassium: 4.5 mmol/L (ref 3.5–5.1)
Sodium: 132 mmol/L — ABNORMAL LOW (ref 135–145)
Total Bilirubin: 0.6 mg/dL (ref 0.3–1.2)
Total Protein: 5.6 g/dL — ABNORMAL LOW (ref 6.5–8.1)

## 2017-04-24 LAB — URIC ACID: Uric Acid, Serum: 3.5 mg/dL — ABNORMAL LOW (ref 4.4–7.6)

## 2017-04-24 LAB — LACTATE DEHYDROGENASE: LDH: 361 U/L — ABNORMAL HIGH (ref 98–192)

## 2017-04-24 MED ORDER — SODIUM CHLORIDE 0.9% FLUSH
10.0000 mL | INTRAVENOUS | Status: DC | PRN
  Filled 2017-04-24: qty 10

## 2017-04-24 MED ORDER — HEPARIN SOD (PORK) LOCK FLUSH 100 UNIT/ML IV SOLN
500.0000 [IU] | Freq: Once | INTRAVENOUS | Status: AC
  Administered 2017-04-24: 500 [IU] via INTRAVENOUS
  Filled 2017-04-24: qty 5

## 2017-04-24 NOTE — Progress Notes (Signed)
[facility] day:  04/24/2017   Chief Complaint: AEDYN KEMPFER is a 76 y.o. male with post-transplant lymphoproliferative disorder, stage IVBE diffuse large B cell lymphoma, who is seen for assessment prior to cycle #2 Rituxan and Revlimid.  HPI:  The patient was last seen in the medical oncology clinic by me on 04/17/2017.  At that time, he felt "ok".  Patient was eating better.  Weight was up 3 pounds. He denied any fevers or sweats.  WBC was 1600 with an ANC of 800.  Rituxan and Revlimid were held.  During the interim, patient has been doing "ok". Patient having "hot flashes". He is experiencing intermittent pain in his LEFT side. Patient denies urinary symptoms. He is eating well despite discontinuation of  the Megace. Patient has not experienced any B symptoms or interval infections. Patient is not taking the Revlimid. He is not taking any new medications or herbal products.    Past Medical History:  Diagnosis Date  . Benign prostatic hypertrophy   . Chronic headache 10/19/2015  . ED (erectile dysfunction)   . End stage renal disease ([place])   . Essential hypertension   . GERD (gastroesophageal reflux disease)   . GIB (gastrointestinal bleeding)    a. 11/2561 s/p R colic artery embolization;  b. 02/2015 EGD: duod ulcerative mass->Bx notable for coagulative necrosis - ? ischemia vs thrombosis-->coumadin d/c'd.  . Gout   . Hearing loss   . Hemorrhoids   . Hyperlipidemia   . Lymphoma ([place])   . Lymphoma ([place]) 2017  . Multiple thyroid nodules 06/06/2016   Noted on carotid US; dedicated US to be ordered by staff  . Osteoarthrosis, unspecified whether generalized or localized, lower leg   . Persistent atrial fibrillation ([place])    a. CHA2DS2VASc = 3-->coumadin d/c'd 02/2015 2/2 recurrent GIB.  [name] Prostatitis   . Pulmonary hypertension ([place])    a. 10/2014 Echo: EF 60-65%, mild to mod MR, mildly dil LA, nl RV, PASP 34mHg.  .Marland KitchenRenal transplant recipient    . Ulcers of both great toes ([facility]     Past Surgical History:  Procedure Laterality Date  . AV FISTULA PLACEMENT  1998  . BACK SURGERY    . ESOPHAGOGASTRODUODENOSCOPY  03/13/15   severe esophagitis, ulcerated mass  . ESOPHAGOGASTRODUODENOSCOPY (EGD) WITH PROPOFOL N/A 07/09/2016   Procedure: ESOPHAGOGASTRODUODENOSCOPY (EGD) WITH PROPOFOL;  Surgeon: KJonathon Bellows MD;  Location: [facility] ENDOSCOPY;  Service: Endoscopy;  Laterality: N/A;  . HERNIA REPAIR  1974  . KIDNEY TRANSPLANT  2006  . PERIPHERAL VASCULAR CATHETERIZATION N/A 05/07/2016   Procedure: PGlori LuisCath Insertion;  Surgeon: JAlgernon Huxley MD;  [facility];  Service: Cardiovascular;  Laterality: N/A;  . PROSTATE ABLATION    . STOMACH SURGERY     blood vessel burst  . THROAT SURGERY    . TOTAL KNEE ARTHROPLASTY      Family History  Problem Relation Age of Onset  . Cancer Mother        throat  . Diabetes Brother   . Heart disease Brother   . Stroke Brother   . Hypertension Brother   . Diabetes Sister   . Heart disease Sister   . Hypertension Sister   . Diabetes Sister   . Diabetes Brother   . COPD Neg Hx   . Kidney disease Neg Hx   . Prostate cancer Neg Hx   . Kidney cancer Neg Hx   . Bladder Cancer  Neg Hx     Social History:  reports that he quit smoking about 38 years ago. His smoking use included Cigarettes. He has a 25.00 pack-year smoking history. He has never used smokeless tobacco. He reports that he does not drink alcohol or use drugs.  He stopped smoking in 1981.  He smoked 3 cigarettes/day.  He lives in [place].  The patient is accompanied by his wife, [name],  today.  Allergies: No Known Allergies  Current Medications: Current Outpatient Prescriptions  Medication Sig Dispense Refill  . acetaminophen (TYLENOL) 325 MG tablet Take 2 tablets (650 mg total) by mouth every 6 (six) hours as needed for mild pain (or Fever >/= 101).    [name] albuterol (PROAIR HFA) 108 (90 BASE) MCG/ACT inhaler Inhale 1-2  puffs into the lungs every 4 (four) hours as needed.     [name] allopurinol (ZYLOPRIM) 100 MG tablet Take 2 tablets (200 mg total) by mouth daily. 60 tablet 2  . COLCRYS 0.6 MG tablet Take 1 tablet by mouth 2 (two) times daily as needed (gout).     . finasteride (PROSCAR) 5 MG tablet Take 1 tablet (5 mg total) by mouth daily. 90 tablet 3  . furosemide (LASIX) 20 MG tablet Take 20 mg by mouth every other day.     . hydroxypropyl methylcellulose (ISOPTO TEARS) 2.5 % ophthalmic solution Place 1 drop into both eyes as needed.     [name] lenalidomide (REVLIMID) 15 MG capsule Take 15 mg by mouth daily. Take 1 capsule (15 mg total) by mouth every other day. for 21 days then off 7 days    . lenalidomide (REVLIMID) 5 MG capsule Take 1 capsule (5 mg total) by mouth daily. for 21 days then off 7 days. 21 capsule 0  . metoprolol tartrate (LOPRESSOR) 25 MG tablet Take 12.5 mg by mouth 2 (two) times daily.     . Multiple Vitamin (MULTIVITAMIN) tablet Take 1 tablet by mouth daily.      [name] oxybutynin (DITROPAN-XL) 5 MG 24 hr tablet Take 1 tablet (5 mg total) by mouth daily. 90 tablet 3  . oxyCODONE-acetaminophen (ROXICET) 5-325 MG tablet Take 1 tablet by mouth every 6 (six) hours as needed. 30 tablet 0  . pantoprazole (PROTONIX) 40 MG tablet TAKE 1 TABLET BY MOUTH TWICE A DAY 60 tablet 1  . predniSONE (DELTASONE) 5 MG tablet Take 5 mg by mouth daily.     . simethicone (MYLICON) 80 MG chewable tablet Chew 1 tablet (80 mg total) by mouth every 6 (six) hours as needed for flatulence. 120 tablet 0  . tacrolimus (PROGRAF) 1 MG capsule Take 3 mg by mouth 2 (two) times daily. Reported on 08/16/2015    . tamsulosin (FLOMAX) 0.4 MG CAPS capsule Take 1 capsule (0.4 mg total) by mouth daily. 90 capsule 3  . diphenhydrAMINE (BENADRYL) 25 mg capsule Take 1 capsule (25 mg total) by mouth at bedtime as needed for sleep. (Patient not taking: Reported on 04/10/2017) 30 capsule 0  . feeding supplement, ENSURE ENLIVE, (ENSURE ENLIVE) LIQD Take  237 mLs by mouth 3 (three) times daily between meals. (Patient not taking: Reported on 04/10/2017) 90 Bottle 0  . megestrol (MEGACE) 400 MG/10ML suspension Take 5 mLs (200 mg total) by mouth daily. (Patient not taking: Reported on 04/10/2017) 240 mL 0  . ondansetron (ZOFRAN) 4 MG tablet Take 1 tablet (4 mg total) by mouth every 6 (six) hours as needed for nausea. (Patient not taking: Reported on 04/10/2017) 20 tablet 0  .  sucralfate (CARAFATE) 1 g tablet Take 1 tablet (1 g total) by mouth 4 (four) times daily. Resume taking after one week- once finished taking oral levaquine. ( to avoid interaction.) (Patient not taking: Reported on 04/10/2017) 40 tablet 3   No current facility-administered medications for this visit.    Facility-Administered Medications Ordered in Other Visits  Medication Dose Route Frequency Provider Last Rate Last Dose  . heparin lock flush 100 unit/mL  500 Units Intravenous Once Corcoran, Melissa C, MD      . heparin lock flush 100 unit/mL  500 Units Intravenous Once Corcoran, Melissa C, MD      . sodium chloride flush (NS) 0.9 % injection 10 mL  10 mL Intracatheter PRN Corcoran, Melissa C, MD      . sodium chloride flush (NS) 0.9 % injection 10 mL  10 mL Intravenous PRN Lequita Asal, MD        Review of Systems:  GENERAL:  Feels "well".  No fevers or sweats.  Weight up 1 pound.   PERFORMANCE STATUS (ECOG):  1 HEENT:  No visual changes, sore throat, mouth sores or tenderness. Lungs: No shortness of breath or cough.  No hemoptysis. Cardiac:  No chest pain, palpitations, orthopnea, or PND. GI:  Appetite good off Megace.  No nausea, vomiting, diarrhea, constipation, melena or hematochezia. GU:  Enlarged prostate.  No urgency, frequency, dysuria, or hematuria.  Left inguinal hernia. Musculoskeletal: No back pain.  Right knee pain.  No muscle tenderness. Extremities:  No pain or swelling. Skin:  Nail changes.  No rashes or skin changes. Neuro:  Forgetful.  No  headache, numbness or weakness, balance or coordination issues. Endocrine:  No diabetes, thyroid issues, hot flashes or night sweats. Psych:  No mood changes, depression or anxiety. Pain:  Right knee pain.   Review of systems:  All other systems reviewed and found to be negative.  Physical Exam: Blood pressure (!) 158/69, pulse (!) 48, temperature (!) 97.4 F (36.3 C), temperature source Tympanic, resp. rate 20, weight 128 lb 7 oz (58.3 kg). GENERAL:  Thin elderly gentleman sitting comfortably in the exam room in no acute distress.  MENTAL STATUS:  Alert and oriented to person, place and time. HEAD:  Wearing a black cap.  Lu Duffel.  Temporal wasting.  Normocephalic, atraumatic, face symmetric, no Cushingoid features. EYES:  Glasses.  Brown eyes.  Pupils equal round and reactive to light and accomodation.  No conjunctivitis or scleral icterus. ENT:  Oropharynx clear without lesion.  Edentulous.  Tongue normal. Mucous membranes moist.  RESPIRATORY:  Clear to auscultation without rales, wheezes or rhonchi. CARDIOVASCULAR:  Regular rate and rhythm without murmur, rub or gallop. ABDOMEN:  Soft, non-tender, with active bowel sounds, and no hepatosplenomegaly.  No masses.  Palpable transplanted kidney.  SKIN:  No rashes, ulcers or lesions. EXTREMITIES:  No edema, no skin discoloration or tenderness.  Right upper extremity with dilated vascular s/p AV fistula, no thrill (chronic).  No palpable cords. NEUROLOGICAL: Unremarkable. PSYCH:  Appropriate.    Imaging studies:  04/28/2016 PET scan:  Bulky intensely hypermetabolic periaortic upper abdominal and mesenteric adenopathy concerning for high-grade lymphoma.  There was hypermetabolic liver metastasis.  There was hpermetabolic lesion involving the small bowel of the upper pelvis.  There were multiple sites of hypermetabolic skeletal metastasis. There was moderate volume right pneumothorax. 08/12/2016 PET scan:  Interval response to therapy. There  has been significant decrease in extent of hypermetabolic tumor within the neck, chest, abdomen and pelvis as  well as the axial and appendicular skeleton.  There was residual enlarged and hypermetabolic small bowel mesenteric and periaortic lymph nodes. There was a persistent hypermetabolic focus of increased uptake within the spleen.  There was resolution of previous multifocal hypermetabolic lesions within the liver. 10/10/2016 PET scan:  Slight worsening compared to the prior study. Several lesions were slightly more hypermetabolic and there appeared to be a newly enlarged and hypermetabolic mesenteric node adjacent to one of the previous mesenteric lymph nodes. However, there are no new hypermetabolic lesions in the neck, chest, or skeleton.  The small hypermetabolic peripheral lesion of the right kidney upper pole is slightly less hypermetabolic. 12/75/1700 PET scan:  Marked progression of disease as evidenced by progressive bulky mesenteric adenopathy, new low left internal jugular/left juxta diaphragmatic/abdominal retroperitoneal adenopathy, new pulmonary nodules, new hepatic and splenic lesions and new peritoneal nodules, all of which are hypermetabolic 17/49/4496 CT abdomen and pelvis: Slight increased size of left lower lobe pulmonary metastatic nodule. Re-demonstrated hypodense liver masses and splenic masses, consistent with metastatic disease, suspect increased in size compared to prior PET-CT. Bulky mesenteric and retroperitoneal masses consistent with metastatic disease, also increased in size compared to recent PET-CT. Atrophic native kidneys. Transplant kidney in the right lower quadrant. Negative for hydronephrosis. 03/08/2017:  Scrotal ultrasound revealed soft tissue structure in the right scrotum separate from the epididymis and testicle. There was internal blood flow. The appearance could represent bowel herniated into the right side of the scrotum but there  was no peristalsis and there  was no definitive hernia seen on a recent CT. 03/08/2017:  Abdomen CT revealed stable lingular and left lower lobe pulmonary metastatic nodules to the extent included.  There was slight interval increase in size of several hepatic metastatic lesions (4.3 cm vs 3.6 cm; 5.2 cm vs 4.9 cm).  There was equivocal overall change with reference to retroperitoneal and mesenteric lymphadenopathy, some areas appear to have increased in size such as the retrocaval lymph node mass (4.1 x 6.4 cm vs 6 x 3.1 cm), others remaining stable such as the left para-aortic and pelvic lymph node index masses and others appearing slightly smaller in the lower abdominal mesentery.   There was some mild nonspecific scrotal skin thickening.  There was no bowel herniation or definite intratesticular mass. Tubular densities were seen within the scrotum bilaterally which were likely related to the spermatic cord and vessels.   There was no significant hydrocele.   Infusion on 04/24/2017  Component Date Value Ref Range Status  . WBC 04/24/2017 1.7* 3.8 - 10.6 K/uL Final  . RBC 04/24/2017 2.61* 4.40 - 5.90 MIL/uL Final  . Hemoglobin 04/24/2017 7.6* 13.0 - 18.0 g/dL Final  . HCT 04/24/2017 22.5* 40.0 - 52.0 % Final  . MCV 04/24/2017 86.5  80.0 - 100.0 fL Final  . MCH 04/24/2017 29.1  26.0 - 34.0 pg Final  . MCHC 04/24/2017 33.7  32.0 - 36.0 g/dL Final  . RDW 04/24/2017 19.6* 11.5 - 14.5 % Final  . Platelets 04/24/2017 104* 150 - 440 K/uL Final  . Neutrophils Relative % 04/24/2017 PENDING  % Incomplete  . Neutro Abs 04/24/2017 PENDING  1.7 - 7.7 K/uL Incomplete  . Band Neutrophils 04/24/2017 PENDING  % Incomplete  . Lymphocytes Relative 04/24/2017 PENDING  % Incomplete  . Lymphs Abs 04/24/2017 PENDING  0.7 - 4.0 K/uL Incomplete  . Monocytes Relative 04/24/2017 PENDING  % Incomplete  . Monocytes Absolute 04/24/2017 PENDING  0.1 - 1.0 K/uL Incomplete  . Eosinophils Relative  04/24/2017 PENDING  % Incomplete  . Eosinophils Absolute  04/24/2017 PENDING  0.0 - 0.7 K/uL Incomplete  . Basophils Relative 04/24/2017 PENDING  % Incomplete  . Basophils Absolute 04/24/2017 PENDING  0.0 - 0.1 K/uL Incomplete  . WBC Morphology 04/24/2017 PENDING   Incomplete  . RBC Morphology 04/24/2017 PENDING   Incomplete  . Smear Review 04/24/2017 PENDING   Incomplete  . Other 04/24/2017 PENDING  % Incomplete  . nRBC 04/24/2017 PENDING  0 /100 WBC Incomplete  . Metamyelocytes Relative 04/24/2017 PENDING  % Incomplete  . Myelocytes 04/24/2017 PENDING  % Incomplete  . Promyelocytes Absolute 04/24/2017 PENDING  % Incomplete  . Blasts 04/24/2017 PENDING  % Incomplete  . Sodium 04/24/2017 132* 135 - 145 mmol/L Final  . Potassium 04/24/2017 4.5  3.5 - 5.1 mmol/L Final  . Chloride 04/24/2017 106  101 - 111 mmol/L Final  . CO2 04/24/2017 23  22 - 32 mmol/L Final  . Glucose, Bld 04/24/2017 113* 65 - 99 mg/dL Final  . BUN 04/24/2017 16  6 - 20 mg/dL Final  . Creatinine, Ser 04/24/2017 0.89  0.61 - 1.24 mg/dL Final  . Calcium 04/24/2017 9.7  8.9 - 10.3 mg/dL Final  . Total Protein 04/24/2017 5.6* 6.5 - 8.1 g/dL Final  . Albumin 04/24/2017 3.3* 3.5 - 5.0 g/dL Final  . AST 04/24/2017 27  15 - 41 U/L Final  . ALT 04/24/2017 15* 17 - 63 U/L Final  . Alkaline Phosphatase 04/24/2017 85  38 - 126 U/L Final  . Total Bilirubin 04/24/2017 0.6  0.3 - 1.2 mg/dL Final  . GFR calc non Af Amer 04/24/2017 >60  >60 mL/min Final  . GFR calc Af Amer 04/24/2017 >60  >60 mL/min Final   Comment: (NOTE) The eGFR has been calculated using the CKD EPI equation. This calculation has not been validated in all clinical situations. eGFR's persistently <60 mL/min signify possible Chronic Kidney Disease.   . Anion gap 04/24/2017 3* 5 - 15 Final  . LDH 04/24/2017 361* 98 - 192 U/L Final    Assessment:  NAZIER Brown is a 76 y.o. male s/p renal transplant (2007) with a post-transplant lymphoproliferative disorder, stage IV diffuse large B cell lymphoma.  He presented with a  2-3 month history of progressive back pain superimposed on chronic back pain.    PET scan on 04/28/2016 revealed bulky intensely hypermetabolic periaortic upper abdominal and mesenteric adenopathy concerning for high-grade lymphoma.  There was hypermetabolic liver metastasis.  There was hpermetabolic lesion involving the small bowel of the upper pelvis.  There were multiple sites of hypermetabolic skeletal metastasis. There was moderate volume right pneumothorax.  CT guided retroperitoneal node biopsy on 04/30/2016 revealed diffuse large B cell lymphoma.  Hepatitis B and C testing were negative on 05/06/2016 and 02/03/2017.  Echo on 05/06/2016 revealed an EF of 55-60%.  Echo on 09/01/2016 revealed an EF of 60-65%.  Bone marrow aspirate and biopsy on 05/08/2016 revealed multifocal marrow involvement by diffuse large B-cell lymphoma. There was variably cellular marrow for age (50% - 90%) with a patchy predominantly nodular large B-cell infiltrate, overall estimated to account for 20% of the core biopsy. There was adequate residual trilineage hematopoiesis with mild nonspecific dyserythropoiesis. There was patchy mild increase in reticulin. Storage iron was present. The immunohistochemical staining pattern of the B-cell infiltrate (CD10 +/-, BCL 6+, BCL-2 +) suggested possible large cell transformation of follicular lymphoma.  Flow cytometry revealed no significant immunophenotypic abnormalities or evidence of B-cell lymphoma.  He  has bone metastasis and hypercalcemia.  Calcium was 11.2 (ionized 7.3) on 05/13/2016.  He received Zometa on 05/13/2016.  He began [place] on 06/09/2016 (last 08/05/2016).  Work-up on 04/15/2016 revealed the following normal studies: ferritin (343), iron saturation (6%), TIBC (241; low), B12 (525), folate (27).  Reticulocyte count was 2%.  LDH was 409.  Uric acid was 7.7 (4.4 - 7.6).  He was admitted at [facility] from 04/28/2016 - 04/30/2016 with a moderate volume right sided  pneumothorax.  His pneumothorax improved spontaneously. Plain films of the right femur revealed the lucent bone lesion in the right femoral neck  was poorly characterized.  There was no evidence for an acute fracture.  PTH was 106 (high) 04/30/2016 with a calcium of 11.2.  Etiology was c/w primary hyperparathyroidism.  PTH-related polypeptide was < 1.1 on 04/30/2016.  He has a history of GI bleeding in 08/2014.  He underwent tagged RBC scan which revealed an active bleed in the hepatic flexure.  He was embolized in vascular interventional radiology.  He was admitted to [facility] from 07/08/2016 - 07/13/2016 with GI bleeding.  EGD on 07/09/2016 revealed multiple non-bleeding duodenal ulcer as well as duodenitis. Tagged RBC scan on 07/11/2016 revealed active GI bleed in the lateral right mid abdomen coursing through multiple curvilinear bowel loops in the right mid abdomen favoring a small bowel source of bleeding.  He received 5 units of PRBCs, 2 units of pheresed platelets, and vitamin K from 07/07/2016 0 07/13/2016.  He was transferred to [facility] from 07/13/2016 - 07/16/2016.  [facility] performed a colonoscopy and push enteroscopy which showed several diverticula and old blood but no source of bleeding. Source of bleed was presumed to be diverticular, although it was possible a small bowel site was not visualized on endoscopy.  He has a history of renal failure s/p renal transplant.  He is tacrolimus (Prograf), and steroids  Creatinine has ranged between 1.43 - 1.93 in the past 6 months. Mycophenolate (MMF) was discontinued at diagnosis.  Prograf level was 5.5 (3.0-8.0) on 05/08/2016, 2.2 on 08/13/2016, 2.3 in 01/01/2017.  He received 6 cycles of mini-RCHOP (05/09/2016 - 09/05/2016).  Cycle #1 was complicated by fever and neutropenia.  Cycle #3 was complicated by a GI bleed.  CSF on 06/19/2016 revealed 7 WBCs (4% segs, 79% lymphs, and 17% monocytes).  He has undergone LP with IT MTX x 4 (07/24/2016, 08/14/2016,  09/04/2016, and 10/02/2016).  Cytology was negative on 07/24/2016 and 09/04/2016.  PET scan on 10/10/2016 revealed slight worsening compared to the prior study. Several lesions were slightly more hypermetabolic and there appeared to be a newly enlarged and hypermetabolic mesenteric node adjacent to one of the previous mesenteric lymph nodes. However, there are no new hypermetabolic lesions in the neck, chest, or skeleton.  The small hypermetabolic peripheral lesion of the right kidney upper pole is slightly less hypermetabolic.  He was lost to follow-up.  PET scan on 01/14/2017 revealed marked progression of disease as evidenced by progressive bulky mesenteric adenopathy, new low left internal jugular/left juxta diaphragmatic/abdominal retroperitoneal adenopathy, new pulmonary nodules, new hepatic and splenic lesions and new peritoneal nodules, all of which are hypermetabolic.  LDH was 480 on 02/06/2017.  He received 2 cycles of gemcitabine and oxaliplatin + obinutuzumab (02/04/2017 - 02/24/2017) with Neulasta support.   Abdomen CT on 03/08/2017 revealed stable lingular and left lower lobe pulmonary metastatic nodules.  There was slight interval increase in size of several hepatic metastatic lesions (4.3 cm vs 3.6 cm; 5.2 cm vs  4.9 cm).  There was equivocal overall change with reference to retroperitoneal and mesenteric lymphadenopathy, some areas appear to have increased in size such as the retrocaval lymph node mass (4.1 x 6.4 cm vs 6 x 3.1 cm), others remaining stable such as the left para-aortic and pelvic lymph node index masses and others appearing slightly smaller in the lower abdominal mesentery.   There was some mild nonspecific scrotal skin thickening.  Tubular densities within the scrotum bilaterally were likely related to the spermatic cord and vessels.   There was no significant hydrocele.  He is day 36 of cycle #1 Rituxan and Revlimid (03/20/2017).  Symptomatically, he feels "ok". Patient  eating better despite not taking the Megace; weight up 1 pound. He denies any fevers or sweats.  Patient has intermittent pain in his LEFT side. He denies urinary symptoms.  WBC is 1700 with an ANC is 1100. Hemoglobin is 7.6, hematocrit 22.5, platelets 104,000. Creatinine has improved to 0.89.   Plan:  1.  Labs today:  CBC with diff, CMP, LDH, uric acid. 2.  Discuss anemia. Patient is not symptomatic. Hemoglobin is 7.6. Patient offered blood transfusion, however he declined citing that he was feeling ok.  3.  Discuss neutropenia. WBC 1700 with an ANC of 1100. Reinforced neutropenic precautions.  If blood counts do not recover, we discussed the need for bone marrow biopsy.  4.  No Rituxan today due to neutropenia.  5.  Continue to hold Revlimid due to neutropenia. We will change Revlimid to 5 mg daily for 21 days with 7 days off (or 15 mg every 3 days if cannot return Revlimid) when restarted. 6.  RTC in 1 week for MD assessment, labs (CBC with diff, CMP, LDH, uric acid), and cycle #2 Rituxan.    Honor Loh, NP 04/24/2017,10:01 AM    I saw and evaluated the patient, participating in the key portions of the service and reviewing pertinent diagnostic studies and records.  I reviewed the nurse practitioner's note and agree with the findings and the plan.  The assessment and plan were discussed with the patient.  Several questions were asked by the patient and answered.   Lequita Asal, MD 04/24/2017,10:01 AM

## 2017-04-24 NOTE — Progress Notes (Signed)
Patient states his appetite is good.  He is having hot flashes.

## 2017-04-25 ENCOUNTER — Encounter: Payer: Self-pay | Admitting: Hematology and Oncology

## 2017-04-30 NOTE — Unmapped (Signed)
Patient request for RX refill.

## 2017-05-01 ENCOUNTER — Inpatient Hospital Stay: Payer: Medicare HMO

## 2017-05-01 ENCOUNTER — Other Ambulatory Visit: Payer: Self-pay | Admitting: *Deleted

## 2017-05-01 ENCOUNTER — Inpatient Hospital Stay (HOSPITAL_BASED_OUTPATIENT_CLINIC_OR_DEPARTMENT_OTHER): Payer: Medicare HMO | Admitting: Hematology and Oncology

## 2017-05-01 VITALS — BP 153/71 | HR 51 | Temp 97.5°F | Resp 18 | Wt 124.4 lb

## 2017-05-01 DIAGNOSIS — M109 Gout, unspecified: Secondary | ICD-10-CM

## 2017-05-01 DIAGNOSIS — K219 Gastro-esophageal reflux disease without esophagitis: Secondary | ICD-10-CM | POA: Diagnosis not present

## 2017-05-01 DIAGNOSIS — D649 Anemia, unspecified: Secondary | ICD-10-CM | POA: Diagnosis not present

## 2017-05-01 DIAGNOSIS — M1990 Unspecified osteoarthritis, unspecified site: Secondary | ICD-10-CM

## 2017-05-01 DIAGNOSIS — Z79899 Other long term (current) drug therapy: Secondary | ICD-10-CM

## 2017-05-01 DIAGNOSIS — Z7189 Other specified counseling: Secondary | ICD-10-CM

## 2017-05-01 DIAGNOSIS — R232 Flushing: Secondary | ICD-10-CM

## 2017-05-01 DIAGNOSIS — K449 Diaphragmatic hernia without obstruction or gangrene: Secondary | ICD-10-CM | POA: Diagnosis not present

## 2017-05-01 DIAGNOSIS — R51 Headache: Secondary | ICD-10-CM | POA: Diagnosis not present

## 2017-05-01 DIAGNOSIS — Z940 Kidney transplant status: Secondary | ICD-10-CM

## 2017-05-01 DIAGNOSIS — D709 Neutropenia, unspecified: Secondary | ICD-10-CM | POA: Diagnosis not present

## 2017-05-01 DIAGNOSIS — C8330 Diffuse large B-cell lymphoma, unspecified site: Secondary | ICD-10-CM

## 2017-05-01 DIAGNOSIS — I4891 Unspecified atrial fibrillation: Secondary | ICD-10-CM

## 2017-05-01 DIAGNOSIS — N186 End stage renal disease: Secondary | ICD-10-CM | POA: Diagnosis not present

## 2017-05-01 DIAGNOSIS — D47Z1 Post-transplant lymphoproliferative disorder (PTLD): Secondary | ICD-10-CM

## 2017-05-01 DIAGNOSIS — I129 Hypertensive chronic kidney disease with stage 1 through stage 4 chronic kidney disease, or unspecified chronic kidney disease: Secondary | ICD-10-CM | POA: Diagnosis not present

## 2017-05-01 DIAGNOSIS — I272 Pulmonary hypertension, unspecified: Secondary | ICD-10-CM

## 2017-05-01 DIAGNOSIS — T8699 Other complications of unspecified transplanted organ and tissue: Secondary | ICD-10-CM

## 2017-05-01 DIAGNOSIS — E785 Hyperlipidemia, unspecified: Secondary | ICD-10-CM | POA: Diagnosis not present

## 2017-05-01 DIAGNOSIS — K2980 Duodenitis without bleeding: Secondary | ICD-10-CM

## 2017-05-01 DIAGNOSIS — E041 Nontoxic single thyroid nodule: Secondary | ICD-10-CM

## 2017-05-01 DIAGNOSIS — Z87891 Personal history of nicotine dependence: Secondary | ICD-10-CM

## 2017-05-01 DIAGNOSIS — Z800 Family history of malignant neoplasm of digestive organs: Secondary | ICD-10-CM

## 2017-05-01 DIAGNOSIS — N400 Enlarged prostate without lower urinary tract symptoms: Secondary | ICD-10-CM

## 2017-05-01 DIAGNOSIS — E210 Primary hyperparathyroidism: Secondary | ICD-10-CM

## 2017-05-01 LAB — CBC WITH DIFFERENTIAL/PLATELET
Basophils Absolute: 0 10*3/uL (ref 0–0.1)
Basophils Relative: 1 %
Eosinophils Absolute: 0.1 10*3/uL (ref 0–0.7)
Eosinophils Relative: 6 %
HCT: 25.4 % — ABNORMAL LOW (ref 40.0–52.0)
Hemoglobin: 8.5 g/dL — ABNORMAL LOW (ref 13.0–18.0)
Lymphocytes Relative: 26 %
Lymphs Abs: 0.5 10*3/uL — ABNORMAL LOW (ref 1.0–3.6)
MCH: 28.9 pg (ref 26.0–34.0)
MCHC: 33.4 g/dL (ref 32.0–36.0)
MCV: 86.6 fL (ref 80.0–100.0)
Monocytes Absolute: 0.2 10*3/uL (ref 0.2–1.0)
Monocytes Relative: 12 %
Neutro Abs: 1.2 10*3/uL — ABNORMAL LOW (ref 1.4–6.5)
Neutrophils Relative %: 55 %
Platelets: 141 10*3/uL — ABNORMAL LOW (ref 150–440)
RBC: 2.93 MIL/uL — ABNORMAL LOW (ref 4.40–5.90)
RDW: 18.5 % — ABNORMAL HIGH (ref 11.5–14.5)
WBC: 2 10*3/uL — ABNORMAL LOW (ref 3.8–10.6)

## 2017-05-01 LAB — COMPREHENSIVE METABOLIC PANEL
ALT: 23 U/L (ref 17–63)
AST: 34 U/L (ref 15–41)
Albumin: 3.7 g/dL (ref 3.5–5.0)
Alkaline Phosphatase: 91 U/L (ref 38–126)
Anion gap: 6 (ref 5–15)
BUN: 19 mg/dL (ref 6–20)
CO2: 23 mmol/L (ref 22–32)
Calcium: 9.7 mg/dL (ref 8.9–10.3)
Chloride: 105 mmol/L (ref 101–111)
Creatinine, Ser: 0.94 mg/dL (ref 0.61–1.24)
GFR calc Af Amer: >60 mL/min (ref >60)
GFR calc non Af Amer: >60 mL/min (ref >60)
Glucose, Bld: 100 mg/dL — ABNORMAL HIGH (ref 65–99)
Potassium: 4.4 mmol/L (ref 3.5–5.1)
Sodium: 134 mmol/L — ABNORMAL LOW (ref 135–145)
Total Bilirubin: 0.7 mg/dL (ref 0.3–1.2)
Total Protein: 6.1 g/dL — ABNORMAL LOW (ref 6.5–8.1)

## 2017-05-01 LAB — URIC ACID: Uric Acid, Serum: 3.7 mg/dL — ABNORMAL LOW (ref 4.4–7.6)

## 2017-05-01 LAB — LACTATE DEHYDROGENASE: LDH: 321 U/L — ABNORMAL HIGH (ref 98–192)

## 2017-05-01 MED ORDER — SODIUM CHLORIDE 0.9% FLUSH
10.0000 mL | INTRAVENOUS | Status: AC | PRN
  Filled 2017-05-01: qty 10

## 2017-05-01 MED ORDER — HEPARIN SOD (PORK) LOCK FLUSH 100 UNIT/ML IV SOLN: 500.0000 [IU] | Freq: Once | INTRAVENOUS | Status: AC

## 2017-05-01 NOTE — Progress Notes (Signed)
Patient offers no complaints today. 

## 2017-05-01 NOTE — Progress Notes (Signed)
[facility] day:  05/01/2017   Chief Complaint: Matthew Brown is a 76 y.o. male with post-transplant lymphoproliferative disorder, stage IVBE diffuse large B cell lymphoma, who is seen for assessment prior to cycle #2 Rituxan and Revlimid.  HPI:  The patient was last seen in the medical oncology clinic by me on 04/24/2017.  At that time,  he felt "ok".  He had gained 1 pound off Megace. He denied any fevers or sweats.  He had intermittent left side pain.  WBC was 1700 with an ANC is 1100. Hemoglobin was 7.6, hematocrit 22.5, platelets 104,000. Rituxan and Revlimid were held.  During the interim, patient is doing "fine". Patient complains of his hernia bothering him some. He denies pain in the clinic today. Patient denies any B symptoms or interval infections. His appetite continues to be good despite holding the Megace. Patient has lost 4 pounds.  Patient states, "I don't understand it. I am eating really really good".    Past Medical History:  Diagnosis Date  . Benign prostatic hypertrophy   . Chronic headache 10/19/2015  . ED (erectile dysfunction)   . End stage renal disease ([place])   . Essential hypertension   . GERD (gastroesophageal reflux disease)   . GIB (gastrointestinal bleeding)    a. 07/2023 s/p R colic artery embolization;  b. 02/2015 EGD: duod ulcerative mass->Bx notable for coagulative necrosis - ? ischemia vs thrombosis-->coumadin d/c'd.  . Gout   . Hearing loss   . Hemorrhoids   . Hyperlipidemia   . Lymphoma ([place])   . Lymphoma ([place]) 2017  . Multiple thyroid nodules 06/06/2016   Noted on carotid US; dedicated US to be ordered by staff  . Osteoarthrosis, unspecified whether generalized or localized, lower leg   . Persistent atrial fibrillation ([place])    a. CHA2DS2VASc = 3-->coumadin d/c'd 02/2015 2/2 recurrent GIB.  [name] Prostatitis   . Pulmonary hypertension ([place])    a. 10/2014 Echo: EF 60-65%, mild to mod MR, mildly dil LA, nl RV,  PASP 38mHg.  .Marland KitchenRenal transplant recipient   . Ulcers of both great toes ([facility]     Past Surgical History:  Procedure Laterality Date  . AV FISTULA PLACEMENT  1998  . BACK SURGERY    . ESOPHAGOGASTRODUODENOSCOPY  03/13/15   severe esophagitis, ulcerated mass  . HERNIA REPAIR  1974  . KIDNEY TRANSPLANT  2006  . PROSTATE ABLATION    . STOMACH SURGERY     blood vessel burst  . THROAT SURGERY    . TOTAL KNEE ARTHROPLASTY      Family History  Problem Relation Age of Onset  . Cancer Mother        throat  . Diabetes Brother   . Heart disease Brother   . Stroke Brother   . Hypertension Brother   . Diabetes Sister   . Heart disease Sister   . Hypertension Sister   . Diabetes Sister   . Diabetes Brother   . COPD Neg Hx   . Kidney disease Neg Hx   . Prostate cancer Neg Hx   . Kidney cancer Neg Hx   . Bladder Cancer Neg Hx     Social History:  reports that he quit smoking about 38 years ago. His smoking use included cigarettes. He has a 25.00 pack-year smoking history. he has never used smokeless tobacco. He reports that he does not drink alcohol or use drugs.  He stopped smoking in 1981.  He smoked 3 cigarettes/day.  He lives in [place].  The patient is accompanied by his wife, [name],  today.  Allergies: No Known Allergies  Current Medications: Current Outpatient Medications  Medication Sig Dispense Refill  . acetaminophen (TYLENOL) 325 MG tablet Take 2 tablets (650 mg total) by mouth every 6 (six) hours as needed for mild pain (or Fever >/= 101).    [name] albuterol (PROAIR HFA) 108 (90 BASE) MCG/ACT inhaler Inhale 1-2 puffs into the lungs every 4 (four) hours as needed.     [name] allopurinol (ZYLOPRIM) 100 MG tablet Take 2 tablets (200 mg total) by mouth daily. 60 tablet 2  . COLCRYS 0.6 MG tablet Take 1 tablet by mouth 2 (two) times daily as needed (gout).     . finasteride (PROSCAR) 5 MG tablet Take 1 tablet (5 mg total) by mouth daily. 90 tablet 3  . furosemide (LASIX) 20 MG  tablet Take 20 mg by mouth every other day.     . hydroxypropyl methylcellulose (ISOPTO TEARS) 2.5 % ophthalmic solution Place 1 drop into both eyes as needed.     [name] lenalidomide (REVLIMID) 15 MG capsule Take 15 mg by mouth daily. Take 1 capsule (15 mg total) by mouth every other day. for 21 days then off 7 days    . lenalidomide (REVLIMID) 5 MG capsule Take 1 capsule (5 mg total) by mouth daily. for 21 days then off 7 days. 21 capsule 0  . metoprolol tartrate (LOPRESSOR) 25 MG tablet Take 12.5 mg by mouth 2 (two) times daily.     . Multiple Vitamin (MULTIVITAMIN) tablet Take 1 tablet by mouth daily.      [name] oxybutynin (DITROPAN-XL) 5 MG 24 hr tablet Take 1 tablet (5 mg total) by mouth daily. 90 tablet 3  . oxyCODONE-acetaminophen (ROXICET) 5-325 MG tablet Take 1 tablet by mouth every 6 (six) hours as needed. 30 tablet 0  . pantoprazole (PROTONIX) 40 MG tablet TAKE 1 TABLET BY MOUTH TWICE A DAY 60 tablet 1  . predniSONE (DELTASONE) 5 MG tablet Take 5 mg by mouth daily.     . simethicone (MYLICON) 80 MG chewable tablet Chew 1 tablet (80 mg total) by mouth every 6 (six) hours as needed for flatulence. 120 tablet 0  . tacrolimus (PROGRAF) 1 MG capsule Take 3 mg by mouth 2 (two) times daily. Reported on 08/16/2015    . tamsulosin (FLOMAX) 0.4 MG CAPS capsule Take 1 capsule (0.4 mg total) by mouth daily. 90 capsule 3  . diphenhydrAMINE (BENADRYL) 25 mg capsule Take 1 capsule (25 mg total) by mouth at bedtime as needed for sleep. (Patient not taking: Reported on 04/10/2017) 30 capsule 0  . feeding supplement, ENSURE ENLIVE, (ENSURE ENLIVE) LIQD Take 237 mLs by mouth 3 (three) times daily between meals. (Patient not taking: Reported on 04/10/2017) 90 Bottle 0  . megestrol (MEGACE) 400 MG/10ML suspension Take 5 mLs (200 mg total) by mouth daily. (Patient not taking: Reported on 04/10/2017) 240 mL 0  . ondansetron (ZOFRAN) 4 MG tablet Take 1 tablet (4 mg total) by mouth every 6 (six) hours as needed for nausea.  (Patient not taking: Reported on 04/10/2017) 20 tablet 0  . sucralfate (CARAFATE) 1 g tablet Take 1 tablet (1 g total) by mouth 4 (four) times daily. Resume taking after one week- once finished taking oral levaquine. ( to avoid interaction.) (Patient not taking: Reported on 04/10/2017) 40 tablet 3   No current facility-administered medications for this visit.  Facility-Administered Medications Ordered in Other Visits  Medication Dose Route Frequency Provider Last Rate Last Dose  . heparin lock flush 100 unit/mL  500 Units Intravenous Once Corcoran, Melissa C, MD      . heparin lock flush 100 unit/mL  500 Units Intravenous Once Corcoran, Melissa C, MD      . sodium chloride flush (NS) 0.9 % injection 10 mL  10 mL Intracatheter PRN Corcoran, Melissa C, MD      . sodium chloride flush (NS) 0.9 % injection 10 mL  10 mL Intravenous PRN Lequita Asal, MD        Review of Systems:  GENERAL:  Feels "well".  No fevers or sweats.  Weight down 4 pounds.   PERFORMANCE STATUS (ECOG):  1 HEENT:  No visual changes, sore throat, mouth sores or tenderness. Lungs: No shortness of breath or cough.  No hemoptysis. Cardiac:  No chest pain, palpitations, orthopnea, or PND. GI:  Appetite good and eating well off Megace.  No nausea, vomiting, diarrhea, constipation, melena or hematochezia. GU:  Enlarged prostate.  No urgency, frequency, dysuria, or hematuria.  Left inguinal hernia. Musculoskeletal: No back pain.  Right knee pain.  No muscle tenderness. Extremities:  No pain or swelling. Skin:  Nail changes.  No rashes or skin changes. Neuro:  Forgetful.  No headache, numbness or weakness, balance or coordination issues. Endocrine:  No diabetes, thyroid issues, hot flashes or night sweats. Psych:  No mood changes, depression or anxiety. Pain:  Hernia bothersome.   Review of systems:  All other systems reviewed and found to be negative.  Physical Exam: Blood pressure (!) 153/71, pulse (!) 51,  temperature (!) 97.5 F (36.4 C), temperature source Tympanic, resp. rate 18, weight 124 lb 6 oz (56.4 kg). GENERAL:  Thin elderly gentleman sitting comfortably in the exam room in no acute distress.  MENTAL STATUS:  Alert and oriented to person, place and time. HEAD:  Wearing a black cap.  [place].  Temporal wasting.  Normocephalic, atraumatic, face symmetric, no Cushingoid features. EYES:  Glasses.  Brown eyes.  Pupils equal round and reactive to light and accomodation.  No conjunctivitis or scleral icterus. ENT:  Oropharynx clear without lesion.  Edentulous.  Tongue normal. Mucous membranes moist.  RESPIRATORY:  Clear to auscultation without rales, wheezes or rhonchi. CARDIOVASCULAR:  Regular rate and rhythm without murmur, rub or gallop. ABDOMEN:  Soft, non-tender, with active bowel sounds, and no hepatosplenomegaly.  No masses.  Palpable transplanted kidney.  SKIN:  No rashes, ulcers or lesions. EXTREMITIES:  No edema, no skin discoloration or tenderness.  Right upper extremity with dilated vascular s/p AV fistula, no thrill (chronic).  No palpable cords. NEUROLOGICAL: Unremarkable. PSYCH:  Appropriate.    Imaging studies:  04/28/2016 PET scan:  Bulky intensely hypermetabolic periaortic upper abdominal and mesenteric adenopathy concerning for high-grade lymphoma.  There was hypermetabolic liver metastasis.  There was hpermetabolic lesion involving the small bowel of the upper pelvis.  There were multiple sites of hypermetabolic skeletal metastasis. There was moderate volume right pneumothorax. 08/12/2016 PET scan:  Interval response to therapy. There has been significant decrease in extent of hypermetabolic tumor within the neck, chest, abdomen and pelvis as well as the axial and appendicular skeleton.  There was residual enlarged and hypermetabolic small bowel mesenteric and periaortic lymph nodes. There was a persistent hypermetabolic focus of increased uptake within the spleen.  There  was resolution of previous multifocal hypermetabolic lesions within the liver. 10/10/2016 PET scan:  Slight  worsening compared to the prior study. Several lesions were slightly more hypermetabolic and there appeared to be a newly enlarged and hypermetabolic mesenteric node adjacent to one of the previous mesenteric lymph nodes. However, there are no new hypermetabolic lesions in the neck, chest, or skeleton.  The small hypermetabolic peripheral lesion of the right kidney upper pole is slightly less hypermetabolic. 74/16/3845 PET scan:  Marked progression of disease as evidenced by progressive bulky mesenteric adenopathy, new low left internal jugular/left juxta diaphragmatic/abdominal retroperitoneal adenopathy, new pulmonary nodules, new hepatic and splenic lesions and new peritoneal nodules, all of which are hypermetabolic 36/46/8032 CT abdomen and pelvis: Slight increased size of left lower lobe pulmonary metastatic nodule. Re-demonstrated hypodense liver masses and splenic masses, consistent with metastatic disease, suspect increased in size compared to prior PET-CT. Bulky mesenteric and retroperitoneal masses consistent with metastatic disease, also increased in size compared to recent PET-CT. Atrophic native kidneys. Transplant kidney in the right lower quadrant. Negative for hydronephrosis. 03/08/2017:  Scrotal ultrasound revealed soft tissue structure in the right scrotum separate from the epididymis and testicle. There was internal blood flow. The appearance could represent bowel herniated into the right side of the scrotum but there  was no peristalsis and there was no definitive hernia seen on a recent CT. 03/08/2017:  Abdomen CT revealed stable lingular and left lower lobe pulmonary metastatic nodules to the extent included.  There was slight interval increase in size of several hepatic metastatic lesions (4.3 cm vs 3.6 cm; 5.2 cm vs 4.9 cm).  There was equivocal overall change with reference to  retroperitoneal and mesenteric lymphadenopathy, some areas appear to have increased in size such as the retrocaval lymph node mass (4.1 x 6.4 cm vs 6 x 3.1 cm), others remaining stable such as the left para-aortic and pelvic lymph node index masses and others appearing slightly smaller in the lower abdominal mesentery.   There was some mild nonspecific scrotal skin thickening.  There was no bowel herniation or definite intratesticular mass. Tubular densities were seen within the scrotum bilaterally which were likely related to the spermatic cord and vessels.   There was no significant hydrocele.   Infusion on 05/01/2017  Component Date Value Ref Range Status  . Uric Acid, Serum 05/01/2017 3.7* 4.4 - 7.6 mg/dL Final  . LDH 05/01/2017 321* 98 - 192 U/L Final  . Sodium 05/01/2017 134* 135 - 145 mmol/L Final  . Potassium 05/01/2017 4.4  3.5 - 5.1 mmol/L Final  . Chloride 05/01/2017 105  101 - 111 mmol/L Final  . CO2 05/01/2017 23  22 - 32 mmol/L Final  . Glucose, Bld 05/01/2017 100* 65 - 99 mg/dL Final  . BUN 05/01/2017 19  6 - 20 mg/dL Final  . Creatinine, Ser 05/01/2017 0.94  0.61 - 1.24 mg/dL Final  . Calcium 05/01/2017 9.7  8.9 - 10.3 mg/dL Final  . Total Protein 05/01/2017 6.1* 6.5 - 8.1 g/dL Final  . Albumin 05/01/2017 3.7  3.5 - 5.0 g/dL Final  . AST 05/01/2017 34  15 - 41 U/L Final  . ALT 05/01/2017 23  17 - 63 U/L Final  . Alkaline Phosphatase 05/01/2017 91  38 - 126 U/L Final  . Total Bilirubin 05/01/2017 0.7  0.3 - 1.2 mg/dL Final  . GFR calc non Af Amer 05/01/2017 >60  >60 mL/min Final  . GFR calc Af Amer 05/01/2017 >60  >60 mL/min Final   Comment: (NOTE) The eGFR has been calculated using the CKD EPI equation. This calculation has  not been validated in all clinical situations. eGFR's persistently <60 mL/min signify possible Chronic Kidney Disease.   . Anion gap 05/01/2017 6  5 - 15 Final  . WBC 05/01/2017 2.0* 3.8 - 10.6 K/uL Final  . RBC 05/01/2017 2.93* 4.40 - 5.90 MIL/uL  Final  . Hemoglobin 05/01/2017 8.5* 13.0 - 18.0 g/dL Final  . HCT 05/01/2017 25.4* 40.0 - 52.0 % Final  . MCV 05/01/2017 86.6  80.0 - 100.0 fL Final  . MCH 05/01/2017 28.9  26.0 - 34.0 pg Final  . MCHC 05/01/2017 33.4  32.0 - 36.0 g/dL Final  . RDW 05/01/2017 18.5* 11.5 - 14.5 % Final  . Platelets 05/01/2017 141* 150 - 440 K/uL Final  . Neutrophils Relative % 05/01/2017 55  % Final  . Lymphocytes Relative 05/01/2017 26  % Final  . Monocytes Relative 05/01/2017 12  % Final  . Eosinophils Relative 05/01/2017 6  % Final  . Basophils Relative 05/01/2017 1  % Final  . Neutro Abs 05/01/2017 1.2* 1.4 - 6.5 K/uL Final  . Lymphs Abs 05/01/2017 0.5* 1.0 - 3.6 K/uL Final  . Monocytes Absolute 05/01/2017 0.2  0.2 - 1.0 K/uL Final  . Eosinophils Absolute 05/01/2017 0.1  0 - 0.7 K/uL Final  . Basophils Absolute 05/01/2017 0.0  0 - 0.1 K/uL Final  . Smear Review 05/01/2017 SMEAR SCANNED    Final    Assessment:  CURLEY HOGEN is a 76 y.o. male s/p renal transplant (2007) with a post-transplant lymphoproliferative disorder, stage IV diffuse large B cell lymphoma.  He presented with a 2-3 month history of progressive back pain superimposed on chronic back pain.    PET scan on 04/28/2016 revealed bulky intensely hypermetabolic periaortic upper abdominal and mesenteric adenopathy concerning for high-grade lymphoma.  There was hypermetabolic liver metastasis.  There was hpermetabolic lesion involving the small bowel of the upper pelvis.  There were multiple sites of hypermetabolic skeletal metastasis. There was moderate volume right pneumothorax.  CT guided retroperitoneal node biopsy on 04/30/2016 revealed diffuse large B cell lymphoma.  Hepatitis B and C testing were negative on 05/06/2016 and 02/03/2017.  Echo on 05/06/2016 revealed an EF of 55-60%.  Echo on 09/01/2016 revealed an EF of 60-65%.  Bone marrow aspirate and biopsy on 05/08/2016 revealed multifocal marrow involvement by diffuse large B-cell  lymphoma. There was variably cellular marrow for age (50% - 90%) with a patchy predominantly nodular large B-cell infiltrate, overall estimated to account for 20% of the core biopsy. There was adequate residual trilineage hematopoiesis with mild nonspecific dyserythropoiesis. There was patchy mild increase in reticulin. Storage iron was present. The immunohistochemical staining pattern of the B-cell infiltrate (CD10 +/-, BCL 6+, BCL-2 +) suggested possible large cell transformation of follicular lymphoma.  Flow cytometry revealed no significant immunophenotypic abnormalities or evidence of B-cell lymphoma.  He has bone metastasis and hypercalcemia.  Calcium was 11.2 (ionized 7.3) on 05/13/2016.  He received Zometa on 05/13/2016.  He began [place] on 06/09/2016 (last 08/05/2016).  Work-up on 04/15/2016 revealed the following normal studies: ferritin (343), iron saturation (6%), TIBC (241; low), B12 (525), folate (27).  Reticulocyte count was 2%.  LDH was 409.  Uric acid was 7.7 (4.4 - 7.6).  He was admitted at [facility] from 04/28/2016 - 04/30/2016 with a moderate volume right sided pneumothorax.  His pneumothorax improved spontaneously. Plain films of the right femur revealed the lucent bone lesion in the right femoral neck  was poorly characterized.  There was no evidence for an  acute fracture.  PTH was 106 (high) 04/30/2016 with a calcium of 11.2.  Etiology was c/w primary hyperparathyroidism.  PTH-related polypeptide was < 1.1 on 04/30/2016.  He has a history of GI bleeding in 08/2014.  He underwent tagged RBC scan which revealed an active bleed in the hepatic flexure.  He was embolized in vascular interventional radiology.  He was admitted to [facility] from 07/08/2016 - 07/13/2016 with GI bleeding.  EGD on 07/09/2016 revealed multiple non-bleeding duodenal ulcer as well as duodenitis. Tagged RBC scan on 07/11/2016 revealed active GI bleed in the lateral right mid abdomen coursing through multiple curvilinear bowel  loops in the right mid abdomen favoring a small bowel source of bleeding.  He received 5 units of PRBCs, 2 units of pheresed platelets, and vitamin K from 07/07/2016 0 07/13/2016.  He was transferred to [facility] from 07/13/2016 - 07/16/2016.  [facility] performed a colonoscopy and push enteroscopy which showed several diverticula and old blood but no source of bleeding. Source of bleed was presumed to be diverticular, although it was possible a small bowel site was not visualized on endoscopy.  He has a history of renal failure s/p renal transplant.  He is tacrolimus (Prograf), and steroids  Creatinine has ranged between 1.43 - 1.93 in the past 6 months. Mycophenolate (MMF) was discontinued at diagnosis.  Prograf level was 5.5 (3.0-8.0) on 05/08/2016, 2.2 on 08/13/2016, 2.3 in 01/01/2017.  He received 6 cycles of mini-RCHOP (05/09/2016 - 09/05/2016).  Cycle #1 was complicated by fever and neutropenia.  Cycle #3 was complicated by a GI bleed.  CSF on 06/19/2016 revealed 7 WBCs (4% segs, 79% lymphs, and 17% monocytes).  He has undergone LP with IT MTX x 4 (07/24/2016, 08/14/2016, 09/04/2016, and 10/02/2016).  Cytology was negative on 07/24/2016 and 09/04/2016.  PET scan on 10/10/2016 revealed slight worsening compared to the prior study. Several lesions were slightly more hypermetabolic and there appeared to be a newly enlarged and hypermetabolic mesenteric node adjacent to one of the previous mesenteric lymph nodes. However, there are no new hypermetabolic lesions in the neck, chest, or skeleton.  The small hypermetabolic peripheral lesion of the right kidney upper pole is slightly less hypermetabolic.  He was lost to follow-up.  PET scan on 01/14/2017 revealed marked progression of disease as evidenced by progressive bulky mesenteric adenopathy, new low left internal jugular/left juxta diaphragmatic/abdominal retroperitoneal adenopathy, new pulmonary nodules, new hepatic and splenic lesions and new peritoneal  nodules, all of which are hypermetabolic.  LDH was 480 on 02/06/2017.  He received 2 cycles of gemcitabine and oxaliplatin + obinutuzumab (02/04/2017 - 02/24/2017) with Neulasta support.   Abdomen CT on 03/08/2017 revealed stable lingular and left lower lobe pulmonary metastatic nodules.  There was slight interval increase in size of several hepatic metastatic lesions (4.3 cm vs 3.6 cm; 5.2 cm vs 4.9 cm).  There was equivocal overall change with reference to retroperitoneal and mesenteric lymphadenopathy, some areas appear to have increased in size such as the retrocaval lymph node mass (4.1 x 6.4 cm vs 6 x 3.1 cm), others remaining stable such as the left para-aortic and pelvic lymph node index masses and others appearing slightly smaller in the lower abdominal mesentery.   There was some mild nonspecific scrotal skin thickening.  Tubular densities within the scrotum bilaterally were likely related to the spermatic cord and vessels.   There was no significant hydrocele.  He is day 43 of cycle #1 Rituxan and Revlimid (03/20/2017).  Counts have not recovered.  Symptomatically, he feels "ok". Patient eating better despite not taking the Megace; weight up 1 pound. He denies any fevers or sweats.  Patient has intermittent pain in his LEFT side. He denies urinary symptoms.  WBC is 2000 with an [place] is 1200. Hemoglobin is 8.5, hematocrit 25.4, platelets 141,000. Creatinine is 0.94.   Plan:  1.  Labs today:  CBC with diff, CMP, LDH, uric acid. 2.  Discuss anemia. Patient is not symptomatic. Hemoglobin is 8.5. Patient does not need a blood transfusion.  3.  Discuss neutropenia. WBC 2000 with an [place] of 1200. Reinforced neutropenic precautions.   4.  Discuss need for bone marrow testing due to persistent neutropenia. Patient agrees to testing. 5.  Schedule bone marrow aspirate and biopsy. 6.  No Rituxan today due to neutropenia.  7.  Continue to hold Revlimid due to neutropenia. We will change Revlimid to 5 mg  daily for 21 days with 7 days off (or 15 mg every 3 days if cannot return Revlimid) when restarted. 8.  RTC 3 days after bone marrow for MD assessment, labs (CBC with diff, CMP, LDH, uric acid), and cycle #2 Rituxan.    Lequita Asal, MD 05/01/2017,9:38 AM    I saw and evaluated the patient, participating in the key portions of the service and reviewing pertinent diagnostic studies and records.  I reviewed the nurse practitioner's note and agree with the findings and the plan.  The assessment and plan were discussed with the patient.  Several questions were asked by the patient and answered.   Lequita Asal, MD 05/01/2017,9:38 AM

## 2017-05-03 ENCOUNTER — Encounter: Payer: Self-pay | Admitting: Hematology and Oncology

## 2017-05-03 MED ORDER — TAMSULOSIN 0.4 MG CAPSULE
ORAL_CAPSULE | Freq: Two times a day (BID) | ORAL | 3 refills | 0 days | Status: CP
Start: 2017-05-03 — End: ?

## 2017-05-07 ENCOUNTER — Telehealth: Payer: Self-pay | Admitting: Pharmacist

## 2017-05-07 ENCOUNTER — Other Ambulatory Visit: Payer: Self-pay | Admitting: *Deleted

## 2017-05-07 DIAGNOSIS — C8330 Diffuse large B-cell lymphoma, unspecified site: Secondary | ICD-10-CM

## 2017-05-07 MED ORDER — OXYCODONE-ACETAMINOPHEN 5-325 MG PO TABS: 1.0000 | ORAL_TABLET | Freq: Four times a day (QID) | ORAL | 0 refills | Status: DC | PRN

## 2017-05-07 MED ORDER — LENALIDOMIDE 5 MG PO CAPS: 5.0000 mg | ORAL_CAPSULE | Freq: Every day | ORAL | 0 refills | Status: AC

## 2017-05-07 NOTE — Telephone Encounter (Addendum)
Oral Chemotherapy Pharmacist Encounter  Sent new Rx for Revlimid lower dose to [employer] for Benefits Investigation. Have asked [employer] to not contact the patient about filling the medication until they are given the go ahead by the office.  REMS auth #: [id number]  Will follow on patient copay for this new dose.  Darl Pikes, PharmD, BCPS Hematology/Oncology Clinical Pharmacist [facility] [phone]  05/07/2017 3:48 PM

## 2017-05-11 NOTE — Unmapped (Signed)
[facility] Refill Coordination Note  Specialty Medication(s): Prograf 1mg     Taylor Ramos, DOB: 03-Apr-1941  Phone: [phone] (home) , Alternate phone contact: N/A  Phone or address changes today?: No  All above HIPAA information was verified with patient.  Shipping Address: [address]  [place] [address]   Insurance changes? No    Completed refill call assessment today to schedule patient's medication shipment from the [facility] [phone]).      Confirmed the medication and dosage are correct and have not changed: Yes, regimen is correct and unchanged.    Confirmed patient started or stopped the following medications in the past month:  No, there are no changes reported at this time.    Are you tolerating your medication?:  Taylor Ramos reports tolerating the medication.    ADHERENCE    (Below is required for Medicare Part B or Transplant patients only - per drug):   How many tablets were dispensed last month:   Prograf 1 mg   Quantity filled last month: 180   # of tablets left on hand: 10 days    Did you miss any doses in the past 4 weeks? No missed doses reported.    FINANCIAL/SHIPPING    Delivery Scheduled: Yes, Expected medication delivery date: 05/19/2017     Taylor Ramos did not have any additional questions at this time.    Delivery address validated in [facility] scheduling system: Yes, address listed in [facility] is correct.    We will follow up with patient monthly for standard refill processing and delivery.      Thank you,  Tamala Fothergill   [facility] Specialty Technician

## 2017-05-12 NOTE — Telephone Encounter (Signed)
Oral Chemotherapy Pharmacist Encounter  Called [facility] to follow-up on new Revlimid 5mg  prescription. Dose change has been approved by his insurance and he has a $0 copay.  Again instructed [facility] to wait on filling/shipping the prescription until they hear from some one from the office about proceeding with the therapy.   [facility] telephone: [phone]   Darl Pikes, PharmD, BCPS Hematology/Oncology Clinical Pharmacist [facility] [phone]  05/12/2017 9:41 AM

## 2017-05-13 ENCOUNTER — Other Ambulatory Visit: Payer: Self-pay | Admitting: Radiology

## 2017-05-15 ENCOUNTER — Ambulatory Visit
Admission: RE | Admit: 2017-05-15 | Discharge: 2017-05-15 | Disposition: A | Discharge status: Home / Self Care | Payer: Medicare HMO | Source: Ambulatory Visit | Attending: Urgent Care | Admitting: Urgent Care

## 2017-05-15 ENCOUNTER — Other Ambulatory Visit (HOSPITAL_COMMUNITY)
Admission: RE | Admit: 2017-05-15 | Disposition: A | Discharge status: Home / Self Care | Payer: Medicare HMO | Source: Ambulatory Visit | Attending: Hematology and Oncology | Admitting: Hematology and Oncology

## 2017-05-15 DIAGNOSIS — E785 Hyperlipidemia, unspecified: Secondary | ICD-10-CM | POA: Diagnosis not present

## 2017-05-15 DIAGNOSIS — Z8249 Family history of ischemic heart disease and other diseases of the circulatory system: Secondary | ICD-10-CM | POA: Diagnosis not present

## 2017-05-15 DIAGNOSIS — N400 Enlarged prostate without lower urinary tract symptoms: Secondary | ICD-10-CM | POA: Insufficient documentation

## 2017-05-15 DIAGNOSIS — Z940 Kidney transplant status: Secondary | ICD-10-CM | POA: Insufficient documentation

## 2017-05-15 DIAGNOSIS — T8699 Other complications of unspecified transplanted organ and tissue: Secondary | ICD-10-CM | POA: Diagnosis not present

## 2017-05-15 DIAGNOSIS — Z87891 Personal history of nicotine dependence: Secondary | ICD-10-CM | POA: Diagnosis not present

## 2017-05-15 DIAGNOSIS — Z800 Family history of malignant neoplasm of digestive organs: Secondary | ICD-10-CM | POA: Insufficient documentation

## 2017-05-15 DIAGNOSIS — D61818 Other pancytopenia: Secondary | ICD-10-CM | POA: Insufficient documentation

## 2017-05-15 DIAGNOSIS — I272 Pulmonary hypertension, unspecified: Secondary | ICD-10-CM | POA: Diagnosis not present

## 2017-05-15 DIAGNOSIS — C8330 Diffuse large B-cell lymphoma, unspecified site: Secondary | ICD-10-CM | POA: Diagnosis present

## 2017-05-15 DIAGNOSIS — Z79899 Other long term (current) drug therapy: Secondary | ICD-10-CM | POA: Insufficient documentation

## 2017-05-15 DIAGNOSIS — Z96659 Presence of unspecified artificial knee joint: Secondary | ICD-10-CM | POA: Insufficient documentation

## 2017-05-15 DIAGNOSIS — Z833 Family history of diabetes mellitus: Secondary | ICD-10-CM | POA: Diagnosis not present

## 2017-05-15 DIAGNOSIS — K219 Gastro-esophageal reflux disease without esophagitis: Secondary | ICD-10-CM | POA: Diagnosis not present

## 2017-05-15 DIAGNOSIS — Z7952 Long term (current) use of systemic steroids: Secondary | ICD-10-CM | POA: Insufficient documentation

## 2017-05-15 DIAGNOSIS — M109 Gout, unspecified: Secondary | ICD-10-CM | POA: Diagnosis not present

## 2017-05-15 DIAGNOSIS — Z79891 Long term (current) use of opiate analgesic: Secondary | ICD-10-CM | POA: Diagnosis not present

## 2017-05-15 DIAGNOSIS — I120 Hypertensive chronic kidney disease with stage 5 chronic kidney disease or end stage renal disease: Secondary | ICD-10-CM | POA: Insufficient documentation

## 2017-05-15 DIAGNOSIS — Z9889 Other specified postprocedural states: Secondary | ICD-10-CM | POA: Diagnosis not present

## 2017-05-15 DIAGNOSIS — Z823 Family history of stroke: Secondary | ICD-10-CM | POA: Diagnosis not present

## 2017-05-15 DIAGNOSIS — I481 Persistent atrial fibrillation: Secondary | ICD-10-CM | POA: Insufficient documentation

## 2017-05-15 DIAGNOSIS — N186 End stage renal disease: Secondary | ICD-10-CM | POA: Diagnosis not present

## 2017-05-15 DIAGNOSIS — D47Z1 Post-transplant lymphoproliferative disorder (PTLD): Secondary | ICD-10-CM | POA: Diagnosis not present

## 2017-05-15 LAB — CBC WITH DIFFERENTIAL/PLATELET
BASOS ABS: 0 10*3/uL (ref 0–0.1)
BASOS PCT: 1 %
Eosinophils Absolute: 0.1 10*3/uL (ref 0–0.7)
Eosinophils Relative: 5 %
HEMATOCRIT: 28.8 % — AB (ref 40.0–52.0)
Hemoglobin: 9.4 g/dL — ABNORMAL LOW (ref 13.0–18.0)
Lymphocytes Relative: 23 %
Lymphs Abs: 0.4 10*3/uL — ABNORMAL LOW (ref 1.0–3.6)
MCH: 28.9 pg (ref 26.0–34.0)
MCHC: 32.7 g/dL (ref 32.0–36.0)
MCV: 88.6 fL (ref 80.0–100.0)
Monocytes Absolute: 0.3 10*3/uL (ref 0.2–1.0)
Monocytes Relative: 14 %
NEUTROS ABS: 1 10*3/uL — AB (ref 1.4–6.5)
NEUTROS PCT: 57 %
Platelets: 109 10*3/uL — ABNORMAL LOW (ref 150–440)
RBC: 3.25 MIL/uL — ABNORMAL LOW (ref 4.40–5.90)
RDW: 18.5 % — AB (ref 11.5–14.5)
WBC: 1.9 10*3/uL — ABNORMAL LOW (ref 3.8–10.6)

## 2017-05-15 LAB — APTT: aPTT: 29 seconds (ref 24–36)

## 2017-05-15 LAB — PROTIME-INR
INR: 1.14
PROTHROMBIN TIME: 14.5 s (ref 11.4–15.2)

## 2017-05-15 MED ORDER — MIDAZOLAM HCL 5 MG/5ML IJ SOLN
INTRAMUSCULAR | Status: AC | PRN
  Administered 2017-05-15 (×2): 1 mg via INTRAVENOUS

## 2017-05-15 MED ORDER — LIDOCAINE HCL (PF) 1 % IJ SOLN
INTRAMUSCULAR | Status: AC | PRN
  Administered 2017-05-15: 5 mL

## 2017-05-15 MED ORDER — SODIUM CHLORIDE 0.9 % IV SOLN
INTRAVENOUS | Status: DC
  Administered 2017-05-15: 20 mL/h via INTRAVENOUS

## 2017-05-15 MED ORDER — MIDAZOLAM HCL 5 MG/5ML IJ SOLN
INTRAMUSCULAR | Status: AC
  Filled 2017-05-15: qty 5

## 2017-05-15 MED ORDER — FENTANYL CITRATE (PF) 100 MCG/2ML IJ SOLN
INTRAMUSCULAR | Status: AC
  Filled 2017-05-15: qty 4

## 2017-05-15 MED ORDER — FENTANYL CITRATE (PF) 100 MCG/2ML IJ SOLN
INTRAMUSCULAR | Status: AC | PRN
  Administered 2017-05-15: 50 ug via INTRAVENOUS

## 2017-05-15 MED ORDER — HEPARIN SOD (PORK) LOCK FLUSH 100 UNIT/ML IV SOLN
INTRAVENOUS | Status: AC
  Filled 2017-05-15: qty 5

## 2017-05-15 NOTE — Consult Note (Signed)
Chief Complaint: Lymhoma  Referring Physician(s): Mauri Pole  Patient Status: [facility] - Out-pt  History of Present Illness: Matthew Brown is a 76 y.o. male with past medical history significant for end-stage renal disease (post renal transplantation), hypertension, hyperlipidemia, atrial fibrillation, pulmonary hypertension and diffuse large cell B-cell lymphoma who presents today for CT-guided bone marrow biopsy and aspiration. He is accompanied by his wife though serves as her own historian.  Patient reports discomfort involving his thrombosed right forearm AV fistula which is no longer in use.   He is otherwise without complaint. No fever or chills. No chest pain or shortness of breath.  Past Medical History:  Diagnosis Date  . Benign prostatic hypertrophy   . Chronic headache 10/19/2015  . ED (erectile dysfunction)   . End stage renal disease ([place])   . Essential hypertension   . GERD (gastroesophageal reflux disease)   . GIB (gastrointestinal bleeding)    a. 11/3873 s/p R colic artery embolization;  b. 02/2015 EGD: duod ulcerative mass->Bx notable for coagulative necrosis - ? ischemia vs thrombosis-->coumadin d/c'd.  . Gout   . Hearing loss   . Hemorrhoids   . Hyperlipidemia   . Lymphoma ([place])   . Lymphoma ([place]) 2017  . Multiple thyroid nodules 06/06/2016   Noted on carotid US; dedicated US to be ordered by staff  . Osteoarthrosis, unspecified whether generalized or localized, lower leg   . Persistent atrial fibrillation ([place])    a. CHA2DS2VASc = 3-->coumadin d/c'd 02/2015 2/2 recurrent GIB.  [name] Prostatitis   . Pulmonary hypertension ([place])    a. 10/2014 Echo: EF 60-65%, mild to mod MR, mildly dil LA, nl RV, PASP 35mHg.  .Marland KitchenRenal transplant recipient   . Ulcers of both great toes ([facility]     Past Surgical History:  Procedure Laterality Date  . AV FISTULA PLACEMENT  1998  . BACK SURGERY    . ESOPHAGOGASTRODUODENOSCOPY  03/13/15   severe esophagitis, ulcerated  mass  . ESOPHAGOGASTRODUODENOSCOPY (EGD) WITH PROPOFOL N/A 07/09/2016   Procedure: ESOPHAGOGASTRODUODENOSCOPY (EGD) WITH PROPOFOL;  Surgeon: KJonathon Bellows MD;  Location: [facility] ENDOSCOPY;  Service: Endoscopy;  Laterality: N/A;  . HERNIA REPAIR  1974  . KIDNEY TRANSPLANT  2006  . PERIPHERAL VASCULAR CATHETERIZATION N/A 05/07/2016   Procedure: PGlori LuisCath Insertion;  Surgeon: JAlgernon Huxley MD;  [facility];  Service: Cardiovascular;  Laterality: N/A;  . PROSTATE ABLATION    . STOMACH SURGERY     blood vessel burst  . THROAT SURGERY    . TOTAL KNEE ARTHROPLASTY      Allergies: Patient has no known allergies.  Medications: Prior to Admission medications   Medication Sig Start Date End Date Taking? Authorizing Provider  acetaminophen (TYLENOL) 325 MG tablet Take 2 tablets (650 mg total) by mouth every 6 (six) hours as needed for mild pain (or Fever >/= 101). 05/13/16  Yes Gouru, AIllene Silver MD  allopurinol (ZYLOPRIM) 100 MG tablet Take 2 tablets (200 mg total) by mouth daily. 03/20/17  Yes GKaren Kitchens NP  COLCRYS 0.6 MG tablet Take 1 tablet by mouth 2 (two) times daily as needed (gout).  05/03/16  Yes [provider]  finasteride (PROSCAR) 5 MG tablet Take 1 tablet (5 mg total) by mouth daily. 09/02/16  Yes McGowan, SLarene BeachA, PA-C  furosemide (LASIX) 20 MG tablet Take 20 mg by mouth every other day.  07/22/16 07/22/17 Yes [provider]  hydroxypropyl methylcellulose (ISOPTO TEARS) 2.5 % ophthalmic solution Place  1 drop into both eyes as needed.    Yes [provider]  metoprolol tartrate (LOPRESSOR) 25 MG tablet Take 12.5 mg by mouth 2 (two) times daily.  11/04/16 11/04/17 Yes [provider]  Multiple Vitamin (MULTIVITAMIN) tablet Take 1 tablet by mouth daily.     Yes [provider]  oxybutynin (DITROPAN-XL) 5 MG 24 hr tablet Take 1 tablet (5 mg total) by mouth daily. 09/02/16  Yes McGowan, Larene Beach A, PA-C  oxyCODONE-acetaminophen (ROXICET)  5-325 MG tablet Take 1 tablet every 6 (six) hours as needed by mouth. 05/07/17  Yes Karen Kitchens, NP  pantoprazole (PROTONIX) 40 MG tablet TAKE 1 TABLET BY MOUTH TWICE A DAY 01/07/17  Yes Corcoran, Melissa C, MD  predniSONE (DELTASONE) 5 MG tablet Take 5 mg by mouth daily.  11/23/13  Yes [provider]  tacrolimus (PROGRAF) 1 MG capsule Take 3 mg by mouth 2 (two) times daily. Reported on 08/16/2015   Yes [provider]  tamsulosin (FLOMAX) 0.4 MG CAPS capsule Take 1 capsule (0.4 mg total) by mouth daily. 09/02/16  Yes McGowan, Larene Beach A, PA-C  albuterol (PROAIR HFA) 108 (90 BASE) MCG/ACT inhaler Inhale 1-2 puffs into the lungs every 4 (four) hours as needed.  02/20/14   [provider]  diphenhydrAMINE (BENADRYL) 25 mg capsule Take 1 capsule (25 mg total) by mouth at bedtime as needed for sleep. Patient not taking: Reported on 04/10/2017 05/13/16   Nicholes Mango, MD  feeding supplement, ENSURE ENLIVE, (ENSURE ENLIVE) LIQD Take 237 mLs by mouth 3 (three) times daily between meals. Patient not taking: Reported on 04/10/2017 05/13/16   Nicholes Mango, MD  lenalidomide (REVLIMID) 5 MG capsule Take 1 capsule (5 mg total) daily by mouth. for 21 days then off 7 days. Do NOT start until instructed by MD 05/07/17   Lequita Asal, MD  megestrol (MEGACE) 400 MG/10ML suspension Take 5 mLs (200 mg total) by mouth daily. Patient not taking: Reported on 04/10/2017 03/12/17   Karen Kitchens, NP  ondansetron (ZOFRAN) 4 MG tablet Take 1 tablet (4 mg total) by mouth every 6 (six) hours as needed for nausea. Patient not taking: Reported on 04/10/2017 05/22/16   Vaughan Basta, MD  simethicone (MYLICON) 80 MG chewable tablet Chew 1 tablet (80 mg total) by mouth every 6 (six) hours as needed for flatulence. 06/28/16   Loletha Grayer, MD  sucralfate (CARAFATE) 1 g tablet Take 1 tablet (1 g total) by mouth 4 (four) times daily. Resume taking after one week- once finished taking oral  levaquine. ( to avoid interaction.) Patient not taking: Reported on 04/10/2017 05/22/16   Vaughan Basta, MD     Family History  Problem Relation Age of Onset  . Cancer Mother        throat  . Diabetes Brother   . Heart disease Brother   . Stroke Brother   . Hypertension Brother   . Diabetes Sister   . Heart disease Sister   . Hypertension Sister   . Diabetes Sister   . Diabetes Brother   . COPD Neg Hx   . Kidney disease Neg Hx   . Prostate cancer Neg Hx   . Kidney cancer Neg Hx   . Bladder Cancer Neg Hx     Social History   Socioeconomic History  . Marital status: Divorced    Spouse name: Not on file  . Number of children: Not on file  . Years of education: Not on file  .  Highest education level: Not on file  Social Needs  . Financial resource strain: Not on file  . Food insecurity - worry: Not on file  . Food insecurity - inability: Not on file  . Transportation needs - medical: Not on file  . Transportation needs - non-medical: Not on file  Occupational History  . Occupation: Retired    Fish farm manager: RETIRED  Tobacco Use  . Smoking status: Former Smoker    Packs/day: 1.00    Years: 25.00    Pack years: 25.00    Types: Cigarettes    Last attempt to quit: 06/23/1978    Years since quitting: 38.9  . Smokeless tobacco: Never Used  Substance and Sexual Activity  . Alcohol use: No  . Drug use: No  . Sexual activity: Yes  Other Topics Concern  . Not on file  Social History Narrative   Divorced   Does not get regular exercise    ECOG Status: 1 - Symptomatic but completely ambulatory  Review of Systems: A 12 point ROS discussed and pertinent positives are indicated in the HPI above.  All other systems are negative.  Review of Systems  Constitutional: Positive for unexpected weight change. Negative for activity change and appetite change.       Patient continues to experience weight loss  Respiratory: Negative.   Cardiovascular: Negative.     Gastrointestinal: Negative.   Musculoskeletal:       Patient complains of discomfort associated with his thrombosed right forearm AV dialysis fistula.  Skin: Negative.     Vital Signs: BP (!) 148/61   Pulse (!) 55   Temp 97.9 F (36.6 C) (Oral)   Resp 20   Ht 5' 5"  (1.651 m)   Wt 124 lb (56.2 kg)   SpO2 100%   BMI 20.63 kg/m   Physical Exam  Constitutional:  Patient appears cachectic.  HENT:  Head: Normocephalic and atraumatic.  Cardiovascular:  irregular rhythm compatible with provided history of atrial fibrillation.  Pulmonary/Chest: Effort normal and breath sounds normal.  Musculoskeletal:  Thrombosed right forearm AV fistula which is mildly tender to palpation.  Skin: Skin is warm and dry.  Psychiatric: He has a normal mood and affect. His behavior is normal.  Nursing note and vitals reviewed.   Imaging: No results found.  Labs:  CBC: Recent Labs    04/17/17 0832 04/24/17 0926 05/01/17 0909 05/15/17 0841  WBC 1.6* 1.7* 2.0* 1.9*  HGB 8.0* 7.6* 8.5* 9.4*  HCT 23.9* 22.5* 25.4* 28.8*  PLT 82* 104* 141* 109*    COAGS: Recent Labs    08/13/16 1141 09/03/16 1050 10/01/16 0850 05/15/17 0841  INR 1.10 1.11 1.15 1.14  APTT 30 29 31 29     BMP: Recent Labs    04/10/17 0932 04/17/17 0832 04/24/17 0926 05/01/17 0909  NA 135 135 132* 134*  K 4.4 4.3 4.5 4.4  CL 105 107 106 105  CO2 24 21* 23 23  GLUCOSE 95 107* 113* 100*  BUN 20 22* 16 19  CALCIUM 9.8 9.5 9.7 9.7  CREATININE 1.15 1.11 0.89 0.94  GFRNONAA 60* >60 >60 >60  GFRAA >60 >60 >60 >60    LIVER FUNCTION TESTS: Recent Labs    04/03/17 1125 04/17/17 0832 04/24/17 0926 05/01/17 0909  BILITOT 0.8 0.8 0.6 0.7  AST 24 27 27  34  ALT 10* 13* 15* 23  ALKPHOS 77 71 85 91  PROT 6.2* 5.7* 5.6* 6.1*  ALBUMIN 3.7 3.5 3.3* 3.7    TUMOR  MARKERS: No results for input(s): AFPTM, CEA, CA199, CHROMGRNA in the last 8760 hours.  Assessment and Plan:  Matthew Brown is a 76 y.o. male with  past medical history significant for end-stage renal disease (post renal transplantation), hypertension, hyperlipidemia, atrial fibrillation, pulmonary hypertension and diffuse large cell B-cell lymphoma who presents today for CT-guided bone marrow biopsy and aspiration.   Risks and benefits to the guided bone marrow biopsy and aspiration were discussed with the patient including, but not limited to bleeding, infection, damage to adjacent structures or low yield requiring additional tests.  All of the patient's questions were answered, patient is agreeable to proceed.  Consent signed and in chart.  Thank you for this interesting consult.  I greatly enjoyed meeting Matthew Brown and look forward to participating in their care.  A copy of this report was sent to the requesting provider on this date.  Electronically Signed: Sandi Mariscal, MD 05/15/2017, 9:46 AM   I spent a total of 15 Minutes  in face to face in clinical consultation, greater than 50% of which was counseling/coordinating care for CT guided BM Bx.

## 2017-05-15 NOTE — Procedures (Signed)
Pre-procedure Diagnosis: Lymphoma Post-procedure Diagnosis: Same  Technically successful CT guided bone marrow aspiration and biopsy of left iliac crest.   Complications: None Immediate  EBL: None  SignedSandi Mariscal Pager: [phone] 05/15/2017, 10:37 AM

## 2017-05-18 MED FILL — PROGRAF/1MG/CAP: PROGRAF/1MG/CAP | 30 days supply | Qty: 180 | Fill #4

## 2017-05-19 ENCOUNTER — Telehealth: Payer: Self-pay | Admitting: *Deleted

## 2017-05-19 NOTE — Telephone Encounter (Signed)
Per Gaspar Bidding staff message from 05/19/17 to Scheduled patient for lab/MD on Friday 05/22/17 Called patient and made him aware of both lab/MD appointments.

## 2017-05-22 ENCOUNTER — Other Ambulatory Visit: Payer: Self-pay

## 2017-05-22 ENCOUNTER — Inpatient Hospital Stay (HOSPITAL_BASED_OUTPATIENT_CLINIC_OR_DEPARTMENT_OTHER): Payer: Medicare HMO | Admitting: Hematology and Oncology

## 2017-05-22 ENCOUNTER — Inpatient Hospital Stay: Payer: Medicare HMO

## 2017-05-22 VITALS — BP 149/55 | HR 55 | Temp 95.0°F | Resp 18 | Wt 119.0 lb

## 2017-05-22 DIAGNOSIS — C787 Secondary malignant neoplasm of liver and intrahepatic bile duct: Secondary | ICD-10-CM

## 2017-05-22 DIAGNOSIS — K2980 Duodenitis without bleeding: Secondary | ICD-10-CM

## 2017-05-22 DIAGNOSIS — N400 Enlarged prostate without lower urinary tract symptoms: Secondary | ICD-10-CM | POA: Diagnosis not present

## 2017-05-22 DIAGNOSIS — D649 Anemia, unspecified: Secondary | ICD-10-CM | POA: Diagnosis not present

## 2017-05-22 DIAGNOSIS — E041 Nontoxic single thyroid nodule: Secondary | ICD-10-CM

## 2017-05-22 DIAGNOSIS — C8330 Diffuse large B-cell lymphoma, unspecified site: Secondary | ICD-10-CM

## 2017-05-22 DIAGNOSIS — M1990 Unspecified osteoarthritis, unspecified site: Secondary | ICD-10-CM

## 2017-05-22 DIAGNOSIS — C7800 Secondary malignant neoplasm of unspecified lung: Secondary | ICD-10-CM

## 2017-05-22 DIAGNOSIS — I272 Pulmonary hypertension, unspecified: Secondary | ICD-10-CM

## 2017-05-22 DIAGNOSIS — Z7189 Other specified counseling: Secondary | ICD-10-CM

## 2017-05-22 DIAGNOSIS — E785 Hyperlipidemia, unspecified: Secondary | ICD-10-CM

## 2017-05-22 DIAGNOSIS — T82898A Other specified complication of vascular prosthetic devices, implants and grafts, initial encounter: Secondary | ICD-10-CM

## 2017-05-22 DIAGNOSIS — Z79899 Other long term (current) drug therapy: Secondary | ICD-10-CM

## 2017-05-22 DIAGNOSIS — N186 End stage renal disease: Secondary | ICD-10-CM | POA: Diagnosis not present

## 2017-05-22 DIAGNOSIS — M109 Gout, unspecified: Secondary | ICD-10-CM

## 2017-05-22 DIAGNOSIS — E210 Primary hyperparathyroidism: Secondary | ICD-10-CM

## 2017-05-22 DIAGNOSIS — Z940 Kidney transplant status: Secondary | ICD-10-CM

## 2017-05-22 DIAGNOSIS — I4891 Unspecified atrial fibrillation: Secondary | ICD-10-CM

## 2017-05-22 DIAGNOSIS — I129 Hypertensive chronic kidney disease with stage 1 through stage 4 chronic kidney disease, or unspecified chronic kidney disease: Secondary | ICD-10-CM

## 2017-05-22 DIAGNOSIS — D709 Neutropenia, unspecified: Secondary | ICD-10-CM | POA: Diagnosis not present

## 2017-05-22 DIAGNOSIS — R232 Flushing: Secondary | ICD-10-CM

## 2017-05-22 DIAGNOSIS — K219 Gastro-esophageal reflux disease without esophagitis: Secondary | ICD-10-CM | POA: Diagnosis not present

## 2017-05-22 DIAGNOSIS — R634 Abnormal weight loss: Secondary | ICD-10-CM | POA: Diagnosis not present

## 2017-05-22 DIAGNOSIS — M25561 Pain in right knee: Secondary | ICD-10-CM

## 2017-05-22 DIAGNOSIS — Z800 Family history of malignant neoplasm of digestive organs: Secondary | ICD-10-CM

## 2017-05-22 DIAGNOSIS — K449 Diaphragmatic hernia without obstruction or gangrene: Secondary | ICD-10-CM

## 2017-05-22 DIAGNOSIS — R51 Headache: Secondary | ICD-10-CM

## 2017-05-22 DIAGNOSIS — R200 Anesthesia of skin: Secondary | ICD-10-CM

## 2017-05-22 DIAGNOSIS — Z87891 Personal history of nicotine dependence: Secondary | ICD-10-CM

## 2017-05-22 DIAGNOSIS — C7951 Secondary malignant neoplasm of bone: Secondary | ICD-10-CM

## 2017-05-22 DIAGNOSIS — D72819 Decreased white blood cell count, unspecified: Secondary | ICD-10-CM

## 2017-05-22 DIAGNOSIS — K4020 Bilateral inguinal hernia, without obstruction or gangrene, not specified as recurrent: Secondary | ICD-10-CM

## 2017-05-22 LAB — CBC WITH DIFFERENTIAL/PLATELET
Basophils Absolute: 0 10*3/uL (ref 0–0.1)
Basophils Relative: 1 %
Eosinophils Absolute: 0.1 10*3/uL (ref 0–0.7)
Eosinophils Relative: 4 %
HCT: 27.8 % — ABNORMAL LOW (ref 40.0–52.0)
Hemoglobin: 9.4 g/dL — ABNORMAL LOW (ref 13.0–18.0)
Lymphocytes Relative: 20 %
Lymphs Abs: 0.4 10*3/uL — ABNORMAL LOW (ref 1.0–3.6)
MCH: 29.5 pg (ref 26.0–34.0)
MCHC: 33.8 g/dL (ref 32.0–36.0)
MCV: 87.3 fL (ref 80.0–100.0)
Monocytes Absolute: 0.3 10*3/uL (ref 0.2–1.0)
Monocytes Relative: 12 %
Neutro Abs: 1.3 10*3/uL — ABNORMAL LOW (ref 1.4–6.5)
Neutrophils Relative %: 63 %
Platelets: 99 10*3/uL — ABNORMAL LOW (ref 150–440)
RBC: 3.19 MIL/uL — ABNORMAL LOW (ref 4.40–5.90)
RDW: 18 % — ABNORMAL HIGH (ref 11.5–14.5)
WBC: 2.1 10*3/uL — ABNORMAL LOW (ref 3.8–10.6)

## 2017-05-22 LAB — URIC ACID: Uric Acid, Serum: 4.5 mg/dL (ref 4.4–7.6)

## 2017-05-22 LAB — TSH: TSH: 1.035 u[IU]/mL (ref 0.350–4.500)

## 2017-05-22 LAB — COMPREHENSIVE METABOLIC PANEL
ALT: 12 U/L — ABNORMAL LOW (ref 17–63)
AST: 29 U/L (ref 15–41)
Albumin: 3.9 g/dL (ref 3.5–5.0)
Alkaline Phosphatase: 75 U/L (ref 38–126)
Anion gap: 7 (ref 5–15)
BUN: 26 mg/dL — ABNORMAL HIGH (ref 6–20)
CO2: 26 mmol/L (ref 22–32)
Calcium: 10.6 mg/dL — ABNORMAL HIGH (ref 8.9–10.3)
Chloride: 103 mmol/L (ref 101–111)
Creatinine, Ser: 1.21 mg/dL (ref 0.61–1.24)
GFR calc Af Amer: >60 mL/min (ref >60)
GFR calc non Af Amer: 56 mL/min — ABNORMAL LOW (ref >60)
Glucose, Bld: 111 mg/dL — ABNORMAL HIGH (ref 65–99)
Potassium: 4.7 mmol/L (ref 3.5–5.1)
Sodium: 136 mmol/L (ref 135–145)
Total Bilirubin: 0.7 mg/dL (ref 0.3–1.2)
Total Protein: 6.2 g/dL — ABNORMAL LOW (ref 6.5–8.1)

## 2017-05-22 LAB — LACTATE DEHYDROGENASE: LDH: 334 U/L — ABNORMAL HIGH (ref 98–192)

## 2017-05-22 NOTE — Progress Notes (Signed)
Here for follow up. ( see follow up eval for pain c/o ) per wife and pt he has  L inguinal hernia that "pops out when I walk " and he pushes it back in. Pain in that area intermittent.

## 2017-05-22 NOTE — Progress Notes (Signed)
[facility]-  Cancer Center  Clinic day:  05/22/2017   Chief Complaint: Matthew Brown is a 76 y.o. male with post-transplant lymphoproliferative disorder, stage IVBE diffuse large B cell lymphoma, who is seen for assessment prior to cycle #2 Rituxan and Revlimid.  HPI:  The patient was last seen in the medical oncology clinic by me on 05/01/2017.  At that time,  he felt "ok".  He was eating better despite not taking the Megace; weight was up 1 pound. He denied any fevers or sweats.  Patient had intermittent pain in his LEFT side. He denied urinary symptoms.  WBC was 2000 with an ANC is 1200. Hemoglobin was 8.5, hematocrit 25.4, platelets 141,000. Creatinine was 0.94.  Because of prolonged pancytopenia, decision was made to pursue a bone marrow biopsy.  Bone marrow on 05/15/2017 was normocellular with erythroid hyperplasia. There was no morphologic or immunophenotypic evidence of lymphoma. There were mild changes in the erythroid and megakaryocytic lineages, which may be reactive in nature.  Flow cytometry revealed a minor B-cell population.  There was no phenotypically aberrant T-cell population.    CBC on 05/15/2017 revealed a hematocrit of 28.8, hemoglobin 9.4, MCV 88.6, platelets 109,000, WBC 1900 with an ANC of 1000.  Patient has been doing well. She notes that he has a good appetite, however he has lost 5 pounds. Patient describes an almost insatiable appetite, therefore it is concerning that he continues to lost weight. Patient continues to use Megace. Patient denies any B symptoms or interval infections.   Patient has pain and numbness in his RIGHT upper extremity. Patient has an AV fistula that is "not working". He has intermittent in his groin. There are known bilateral inguinal hernias present that he has been advised with require surgical intervention. Patient is having arthritic pain in his RIGHT knee. He has had a TKR in the past.    Past Medical History:  Diagnosis  Date  . Benign prostatic hypertrophy   . Chronic headache 10/19/2015  . ED (erectile dysfunction)   . End stage renal disease (HCC)   . Essential hypertension   . GERD (gastroesophageal reflux disease)   . GIB (gastrointestinal bleeding)    a. 08/2014 s/p R colic artery embolization;  b. 02/2015 EGD: duod ulcerative mass->Bx notable for coagulative necrosis - ? ischemia vs thrombosis-->coumadin d/c'd.  . Gout   . Hearing loss   . Hemorrhoids   . Hyperlipidemia   . Lymphoma (HCC)   . Lymphoma (HCC) 2017  . Multiple thyroid nodules 06/06/2016   Noted on carotid US; dedicated US to be ordered by staff  . Osteoarthrosis, unspecified whether generalized or localized, lower leg   . Persistent atrial fibrillation (HCC)    a. CHA2DS2VASc = 3-->coumadin d/c'd 02/2015 2/2 recurrent GIB.  . Prostatitis   . Pulmonary hypertension (HCC)    a. 10/2014 Echo: EF 60-65%, mild to mod MR, mildly dil LA, nl RV, PASP 80mmHg.  . Renal transplant recipient   . Ulcers of both great toes (HCC)     Past Surgical History:  Procedure Laterality Date  . AV FISTULA PLACEMENT  1998  . BACK SURGERY    . ESOPHAGOGASTRODUODENOSCOPY  03/13/15   severe esophagitis, ulcerated mass  . ESOPHAGOGASTRODUODENOSCOPY (EGD) WITH PROPOFOL N/A 07/09/2016   Procedure: ESOPHAGOGASTRODUODENOSCOPY (EGD) WITH PROPOFOL;  Surgeon: Kiran Anna, MD;  Location: [facility] ENDOSCOPY;  Service: Endoscopy;  Laterality: N/A;  . HERNIA REPAIR  1974  . KIDNEY TRANSPLANT  2006  . PERIPHERAL VASCULAR   CATHETERIZATION N/A 05/07/2016   Procedure: Porta Cath Insertion;  Surgeon: Jason S Dew, MD;  Location: [facility] INVASIVE CV LAB;  Service: Cardiovascular;  Laterality: N/A;  . PROSTATE ABLATION    . STOMACH SURGERY     blood vessel burst  . THROAT SURGERY    . TOTAL KNEE ARTHROPLASTY      Family History  Problem Relation Age of Onset  . Cancer Mother        throat  . Diabetes Brother   . Heart disease Brother   . Stroke Brother   . Hypertension  Brother   . Diabetes Sister   . Heart disease Sister   . Hypertension Sister   . Diabetes Sister   . Diabetes Brother   . COPD Neg Hx   . Kidney disease Neg Hx   . Prostate cancer Neg Hx   . Kidney cancer Neg Hx   . Bladder Cancer Neg Hx     Social History:  reports that he quit smoking about 38 years ago. His smoking use included cigarettes. He has a 25.00 pack-year smoking history. he has never used smokeless tobacco. He reports that he does not drink alcohol or use drugs.  He stopped smoking in 1981.  He smoked 3 cigarettes/day.  He lives in [place].  The patient is accompanied by his wife, [name],  today.  Allergies: No Known Allergies  Current Medications: Current Outpatient Medications  Medication Sig Dispense Refill  . allopurinol (ZYLOPRIM) 100 MG tablet Take 2 tablets (200 mg total) by mouth daily. 60 tablet 2  . finasteride (PROSCAR) 5 MG tablet Take 1 tablet (5 mg total) by mouth daily. 90 tablet 3  . furosemide (LASIX) 20 MG tablet Take 20 mg by mouth every other day.     . hydroxypropyl methylcellulose (ISOPTO TEARS) 2.5 % ophthalmic solution Place 1 drop into both eyes as needed.     . megestrol (MEGACE) 400 MG/10ML suspension Take 5 mLs (200 mg total) by mouth daily. 240 mL 0  . metoprolol tartrate (LOPRESSOR) 25 MG tablet Take 12.5 mg by mouth 2 (two) times daily.     . Multiple Vitamin (MULTIVITAMIN) tablet Take 1 tablet by mouth daily.      . oxybutynin (DITROPAN-XL) 5 MG 24 hr tablet Take 1 tablet (5 mg total) by mouth daily. 90 tablet 3  . oxyCODONE-acetaminophen (ROXICET) 5-325 MG tablet Take 1 tablet every 6 (six) hours as needed by mouth. 30 tablet 0  . pantoprazole (PROTONIX) 40 MG tablet TAKE 1 TABLET BY MOUTH TWICE A DAY 60 tablet 1  . predniSONE (DELTASONE) 5 MG tablet Take 5 mg by mouth daily.     . simethicone (MYLICON) 80 MG chewable tablet Chew 1 tablet (80 mg total) by mouth every 6 (six) hours as needed for flatulence. 120 tablet 0  . tacrolimus  (PROGRAF) 1 MG capsule Take 3 mg by mouth 2 (two) times daily. Reported on 08/16/2015    . tamsulosin (FLOMAX) 0.4 MG CAPS capsule Take 1 capsule (0.4 mg total) by mouth daily. 90 capsule 3  . acetaminophen (TYLENOL) 325 MG tablet Take 2 tablets (650 mg total) by mouth every 6 (six) hours as needed for mild pain (or Fever >/= 101). (Patient not taking: Reported on 05/22/2017)    . albuterol (PROAIR HFA) 108 (90 BASE) MCG/ACT inhaler Inhale 1-2 puffs into the lungs every 4 (four) hours as needed.     . COLCRYS 0.6 MG tablet Take 1 tablet by mouth 2 (two)   times daily as needed (gout).     . diphenhydrAMINE (BENADRYL) 25 mg capsule Take 1 capsule (25 mg total) by mouth at bedtime as needed for sleep. (Patient not taking: Reported on 04/10/2017) 30 capsule 0  . feeding supplement, ENSURE ENLIVE, (ENSURE ENLIVE) LIQD Take 237 mLs by mouth 3 (three) times daily between meals. (Patient not taking: Reported on 04/10/2017) 90 Bottle 0  . lenalidomide (REVLIMID) 5 MG capsule Take 1 capsule (5 mg total) daily by mouth. for 21 days then off 7 days. Do NOT start until instructed by MD (Patient not taking: Reported on 05/22/2017) 21 capsule 0  . ondansetron (ZOFRAN) 4 MG tablet Take 1 tablet (4 mg total) by mouth every 6 (six) hours as needed for nausea. (Patient not taking: Reported on 04/10/2017) 20 tablet 0  . sucralfate (CARAFATE) 1 g tablet Take 1 tablet (1 g total) by mouth 4 (four) times daily. Resume taking after one week- once finished taking oral levaquine. ( to avoid interaction.) (Patient not taking: Reported on 04/10/2017) 40 tablet 3   No current facility-administered medications for this visit.    Facility-Administered Medications Ordered in Other Visits  Medication Dose Route Frequency Provider Last Rate Last Dose  . heparin lock flush 100 unit/mL  500 Units Intravenous Once Corcoran, Melissa C, MD      . heparin lock flush 100 unit/mL  500 Units Intravenous Once Corcoran, Melissa C, MD      .  sodium chloride flush (NS) 0.9 % injection 10 mL  10 mL Intracatheter PRN Corcoran, Melissa C, MD      . sodium chloride flush (NS) 0.9 % injection 10 mL  10 mL Intravenous PRN Corcoran, Melissa C, MD        Review of Systems:  GENERAL:  Feels "well".  No fevers or sweats.  Weight down 5 pounds.   PERFORMANCE STATUS (ECOG):  1 HEENT:  No visual changes, sore throat, mouth sores or tenderness. Lungs: No shortness of breath or cough.  No hemoptysis. Cardiac:  No chest pain, palpitations, orthopnea, or PND. GI:  Appetite good and eating well.  No nausea, vomiting, diarrhea, constipation, melena or hematochezia. GU:  Enlarged prostate.  No urgency, frequency, dysuria, or hematuria.  Left inguinal hernia, reducible. Musculoskeletal: No back pain.  Right knee pain.  No muscle tenderness. Extremities:  No pain or swelling. Skin:  Nail changes.  No rashes or skin changes. Neuro:  Forgetful.  No headache, numbness or weakness, balance or coordination issues. Endocrine:  No diabetes, thyroid issues, hot flashes or night sweats. Psych:  No mood changes, depression or anxiety. Pain:  Hernia bothersome.   Review of systems:  All other systems reviewed and found to be negative.  Physical Exam: Blood pressure (!) 149/55, pulse (!) 55, temperature (!) 95 F (35 C), temperature source Tympanic, resp. rate 18, weight 119 lb (54 kg). GENERAL:  Thin elderly gentleman sitting comfortably in the exam room in no acute distress.  MENTAL STATUS:  Alert and oriented to person, place and time. HEAD:  Wearing a black cap.  Gray goatee.  Temporal wasting.  Normocephalic, atraumatic, face symmetric, no Cushingoid features. EYES:  Glasses.  Brown eyes.  Pupils equal round and reactive to light and accomodation.  No conjunctivitis or scleral icterus. ENT:  Oropharynx clear without lesion.  Edentulous.  Tongue normal. Mucous membranes moist.  RESPIRATORY:  Clear to auscultation without rales, wheezes or  rhonchi. CARDIOVASCULAR:  Regular rate and rhythm without murmur, rub or gallop. ABDOMEN:    Soft, non-tender, with active bowel sounds, and no hepatosplenomegaly.  No masses.  Palpable transplanted kidney.  SKIN:  No rashes, ulcers or lesions. EXTREMITIES:  No edema, no skin discoloration or tenderness.  Right upper extremity with dilated vascular s/p AV fistula, no thrill (chronic).  No palpable cords. NEUROLOGICAL: Unremarkable. PSYCH:  Appropriate.    Imaging studies:  04/28/2016 PET scan:  Bulky intensely hypermetabolic periaortic upper abdominal and mesenteric adenopathy concerning for high-grade lymphoma.  There was hypermetabolic liver metastasis.  There was hpermetabolic lesion involving the small bowel of the upper pelvis.  There were multiple sites of hypermetabolic skeletal metastasis. There was moderate volume right pneumothorax. 08/12/2016 PET scan:  Interval response to therapy. There has been significant decrease in extent of hypermetabolic tumor within the neck, chest, abdomen and pelvis as well as the axial and appendicular skeleton.  There was residual enlarged and hypermetabolic small bowel mesenteric and periaortic lymph nodes. There was a persistent hypermetabolic focus of increased uptake within the spleen.  There was resolution of previous multifocal hypermetabolic lesions within the liver. 10/10/2016 PET scan:  Slight worsening compared to the prior study. Several lesions were slightly more hypermetabolic and there appeared to be a newly enlarged and hypermetabolic mesenteric node adjacent to one of the previous mesenteric lymph nodes. However, there are no new hypermetabolic lesions in the neck, chest, or skeleton.  The small hypermetabolic peripheral lesion of the right kidney upper pole is slightly less hypermetabolic. 01/14/2017 PET scan:  Marked progression of disease as evidenced by progressive bulky mesenteric adenopathy, new low left internal jugular/left juxta  diaphragmatic/abdominal retroperitoneal adenopathy, new pulmonary nodules, new hepatic and splenic lesions and new peritoneal nodules, all of which are hypermetabolic 02/17/2017 CT abdomen and pelvis: Slight increased size of left lower lobe pulmonary metastatic nodule. Re-demonstrated hypodense liver masses and splenic masses, consistent with metastatic disease, suspect increased in size compared to prior PET-CT. Bulky mesenteric and retroperitoneal masses consistent with metastatic disease, also increased in size compared to recent PET-CT. Atrophic native kidneys. Transplant kidney in the right lower quadrant. Negative for hydronephrosis. 03/08/2017:  Scrotal ultrasound revealed soft tissue structure in the right scrotum separate from the epididymis and testicle. There was internal blood flow. The appearance could represent bowel herniated into the right side of the scrotum but there  was no peristalsis and there was no definitive hernia seen on a recent CT. 03/08/2017:  Abdomen CT revealed stable lingular and left lower lobe pulmonary metastatic nodules to the extent included.  There was slight interval increase in size of several hepatic metastatic lesions (4.3 cm vs 3.6 cm; 5.2 cm vs 4.9 cm).  There was equivocal overall change with reference to retroperitoneal and mesenteric lymphadenopathy, some areas appear to have increased in size such as the retrocaval lymph node mass (4.1 x 6.4 cm vs 6 x 3.1 cm), others remaining stable such as the left para-aortic and pelvic lymph node index masses and others appearing slightly smaller in the lower abdominal mesentery.   There was some mild nonspecific scrotal skin thickening.  There was no bowel herniation or definite intratesticular mass. Tubular densities were seen within the scrotum bilaterally which were likely related to the spermatic cord and vessels.   There was no significant hydrocele.   Appointment on 05/22/2017  Component Date Value Ref Range Status   . LDH 05/22/2017 334* 98 - 192 U/L Final  . Sodium 05/22/2017 136  135 - 145 mmol/L Final  . Potassium 05/22/2017 4.7  3.5 - 5.1 mmol/L   Final  . Chloride 05/22/2017 103  101 - 111 mmol/L Final  . CO2 05/22/2017 26  22 - 32 mmol/L Final  . Glucose, Bld 05/22/2017 111* 65 - 99 mg/dL Final  . BUN 05/22/2017 26* 6 - 20 mg/dL Final  . Creatinine, Ser 05/22/2017 1.21  0.61 - 1.24 mg/dL Final  . Calcium 05/22/2017 10.6* 8.9 - 10.3 mg/dL Final  . Total Protein 05/22/2017 6.2* 6.5 - 8.1 g/dL Final  . Albumin 05/22/2017 3.9  3.5 - 5.0 g/dL Final  . AST 05/22/2017 29  15 - 41 U/L Final  . ALT 05/22/2017 12* 17 - 63 U/L Final  . Alkaline Phosphatase 05/22/2017 75  38 - 126 U/L Final  . Total Bilirubin 05/22/2017 0.7  0.3 - 1.2 mg/dL Final  . GFR calc non Af Amer 05/22/2017 56* >60 mL/min Final  . GFR calc Af Amer 05/22/2017 >60  >60 mL/min Final   Comment: (NOTE) The eGFR has been calculated using the CKD EPI equation. This calculation has not been validated in all clinical situations. eGFR's persistently <60 mL/min signify possible Chronic Kidney Disease.   . Anion gap 05/22/2017 7  5 - 15 Final  . WBC 05/22/2017 2.1* 3.8 - 10.6 K/uL Final  . RBC 05/22/2017 3.19* 4.40 - 5.90 MIL/uL Final  . Hemoglobin 05/22/2017 9.4* 13.0 - 18.0 g/dL Final  . HCT 05/22/2017 27.8* 40.0 - 52.0 % Final  . MCV 05/22/2017 87.3  80.0 - 100.0 fL Final  . MCH 05/22/2017 29.5  26.0 - 34.0 pg Final  . MCHC 05/22/2017 33.8  32.0 - 36.0 g/dL Final  . RDW 05/22/2017 18.0* 11.5 - 14.5 % Final  . Platelets 05/22/2017 99* 150 - 440 K/uL Final  . Neutrophils Relative % 05/22/2017 63  % Final  . Neutro Abs 05/22/2017 1.3* 1.4 - 6.5 K/uL Final  . Lymphocytes Relative 05/22/2017 20  % Final  . Lymphs Abs 05/22/2017 0.4* 1.0 - 3.6 K/uL Final  . Monocytes Relative 05/22/2017 12  % Final  . Monocytes Absolute 05/22/2017 0.3  0.2 - 1.0 K/uL Final  . Eosinophils Relative 05/22/2017 4  % Final  . Eosinophils Absolute 05/22/2017  0.1  0 - 0.7 K/uL Final  . Basophils Relative 05/22/2017 1  % Final  . Basophils Absolute 05/22/2017 0.0  0 - 0.1 K/uL Final    Assessment:  FIRMIN BELISLE is a 76 y.o. male s/p renal transplant (2007) with a post-transplant lymphoproliferative disorder, stage IV diffuse large B cell lymphoma.  He presented with a 2-3 month history of progressive back pain superimposed on chronic back pain.    PET scan on 04/28/2016 revealed bulky intensely hypermetabolic periaortic upper abdominal and mesenteric adenopathy concerning for high-grade lymphoma.  There was hypermetabolic liver metastasis.  There was hpermetabolic lesion involving the small bowel of the upper pelvis.  There were multiple sites of hypermetabolic skeletal metastasis. There was moderate volume right pneumothorax.  CT guided retroperitoneal node biopsy on 04/30/2016 revealed diffuse large B cell lymphoma.  Hepatitis B and C testing were negative on 05/06/2016 and 02/03/2017.  Echo on 05/06/2016 revealed an EF of 55-60%.  Echo on 09/01/2016 revealed an EF of 60-65%.  Bone marrow aspirate and biopsy on 05/08/2016 revealed multifocal marrow involvement by diffuse large B-cell lymphoma. There was variably cellular marrow for age (50% - 90%) with a patchy predominantly nodular large B-cell infiltrate, overall estimated to account for 20% of the core biopsy. There was adequate residual trilineage hematopoiesis with mild nonspecific dyserythropoiesis.  There was patchy mild increase in reticulin. Storage iron was present. The immunohistochemical staining pattern of the B-cell infiltrate (CD10 +/-, BCL 6+, BCL-2 +) suggested possible large cell transformation of follicular lymphoma.  Flow cytometry revealed no significant immunophenotypic abnormalities or evidence of B-cell lymphoma.  Bone marrow aspirate and biopsy on 05/15/2017 was normocellular with erythroid hyperplasia. There was no morphologic or immunophenotypic evidence of lymphoma. There were  mild changes in the erythroid and megakaryocytic lineages, which may be reactive in nature.  Flow cytometry revealed a minor B-cell population.  There was no phenotypically aberrant T-cell population.    He has bone metastasis and hypercalcemia.  Calcium was 11.2 (ionized 7.3) on 05/13/2016.  He received Zometa on 05/13/2016.  He began Xgeva on 06/09/2016 (last 08/05/2016).  Work-up on 04/15/2016 revealed the following normal studies: ferritin (343), iron saturation (6%), TIBC (241; low), B12 (525), folate (27).  Reticulocyte count was 2%.  LDH was 409.  Uric acid was 7.7 (4.4 - 7.6).  He was admitted at [facility] from 04/28/2016 - 04/30/2016 with a moderate volume right sided pneumothorax.  His pneumothorax improved spontaneously. Plain films of the right femur revealed the lucent bone lesion in the right femoral neck  was poorly characterized.  There was no evidence for an acute fracture.  PTH was 106 (high) 04/30/2016 with a calcium of 11.2.  Etiology was c/w primary hyperparathyroidism.  PTH-related polypeptide was < 1.1 on 04/30/2016.  He has a history of GI bleeding in 08/2014.  He underwent tagged RBC scan which revealed an active bleed in the hepatic flexure.  He was embolized in vascular interventional radiology.  He was admitted to [facility] from 07/08/2016 - 07/13/2016 with GI bleeding.  EGD on 07/09/2016 revealed multiple non-bleeding duodenal ulcer as well as duodenitis. Tagged RBC scan on 07/11/2016 revealed active GI bleed in the lateral right mid abdomen coursing through multiple curvilinear bowel loops in the right mid abdomen favoring a small bowel source of bleeding.  He received 5 units of PRBCs, 2 units of pheresed platelets, and vitamin K from 07/07/2016 0 07/13/2016.  He was transferred to [facility] from 07/13/2016 - 07/16/2016.  [facility] performed a colonoscopy and push enteroscopy which showed several diverticula and old blood but no source of bleeding. Source of bleed was presumed to be  diverticular, although it was possible a small bowel site was not visualized on endoscopy.  He has a history of renal failure s/p renal transplant.  He is tacrolimus (Prograf), and steroids  Creatinine has ranged between 1.43 - 1.93 in the past 6 months. Mycophenolate (MMF) was discontinued at diagnosis.  Prograf level was 5.5 (3.0-8.0) on 05/08/2016, 2.2 on 08/13/2016, 2.3 in 01/01/2017.  He received 6 cycles of mini-RCHOP (05/09/2016 - 09/05/2016).  Cycle #1 was complicated by fever and neutropenia.  Cycle #3 was complicated by a GI bleed.  CSF on 06/19/2016 revealed 7 WBCs (4% segs, 79% lymphs, and 17% monocytes).  He has undergone LP with IT MTX x 4 (07/24/2016, 08/14/2016, 09/04/2016, and 10/02/2016).  Cytology was negative on 07/24/2016 and 09/04/2016.  PET scan on 10/10/2016 revealed slight worsening compared to the prior study. Several lesions were slightly more hypermetabolic and there appeared to be a newly enlarged and hypermetabolic mesenteric node adjacent to one of the previous mesenteric lymph nodes. However, there are no new hypermetabolic lesions in the neck, chest, or skeleton.  The small hypermetabolic peripheral lesion of the right kidney upper pole is slightly less hypermetabolic.  He was lost to   follow-up.  PET scan on 01/14/2017 revealed marked progression of disease as evidenced by progressive bulky mesenteric adenopathy, new low left internal jugular/left juxta diaphragmatic/abdominal retroperitoneal adenopathy, new pulmonary nodules, new hepatic and splenic lesions and new peritoneal nodules, all of which are hypermetabolic.  LDH was 480 on 02/06/2017.  He received 2 cycles of gemcitabine and oxaliplatin + obinutuzumab (02/04/2017 - 02/24/2017) with Neulasta support.   Abdomen CT on 03/08/2017 revealed stable lingular and left lower lobe pulmonary metastatic nodules.  There was slight interval increase in size of several hepatic metastatic lesions (4.3 cm vs 3.6 cm; 5.2 cm  vs 4.9 cm).  There was equivocal overall change with reference to retroperitoneal and mesenteric lymphadenopathy, some areas appear to have increased in size such as the retrocaval lymph node mass (4.1 x 6.4 cm vs 6 x 3.1 cm), others remaining stable such as the left para-aortic and pelvic lymph node index masses and others appearing slightly smaller in the lower abdominal mesentery.   There was some mild nonspecific scrotal skin thickening.  Tubular densities within the scrotum bilaterally were likely related to the spermatic cord and vessels.   There was no significant hydrocele.  He is day 35 of cycle #1 Rituxan and Revlimid (03/20/2017).  Counts have not recovered.  Symptomatically, he feels "ok". Patient has lost 5 pounds since his last visit. He is using the prescribed Megace, which he notes is effective in stimulation of his appetite. He denies any fevers or sweats.  Exam reveal bilateral inguinal hernias that are not incarcerated. WBC is 2100 with an [place] is 1300. Hemoglobin is 9.4, hematocrit 27.8, platelets 99,000. Creatinine is 1.21. LDH is 334.  Plan:  1.  Labs today:  CBC with diff, CMP, LDH, uric acid, TSH 2.  Review interval bone marrow- no evidence of lymphoma. 3.  Discuss anemia. Patient is not symptomatic. Hemoglobin is 9.4. Patient will not require a blood transfusion.  4.  Discuss neutropenia. WBC 2100 with an [place] of 1300. Reinforced neutropenic precautions.   5.  No Rituxan today. Contact Dr. Lonia Blood for further recommendations.  6.  Continue to hold Revlimid due to neutropenia. We will change Revlimid to 5 mg daily for 21 days with 7 days off (or 15 mg every 3 days if cannot return Revlimid) when restarted. 7.  Refer to vascular (Dr Lucky Cowboy) to evaluation of painful and non-functioning AV fistula. 8.  RTC in 1 week for  MD assessment, labs (CBC with diff, CMP, LDH, uric acid, prograf), and cycle #2 Rituxan.    Honor Loh, NP 05/22/2017, 12:09 PM   I saw and evaluated the patient,  participating in the key portions of the service and reviewing pertinent diagnostic studies and records.  I reviewed the nurse practitioner's note and agree with the findings and the plan.  The assessment and plan were discussed with the patient.  Several questions were asked by the patient and answered.   Lequita Asal, MD 05/22/2017, 12:09 PM

## 2017-05-26 NOTE — Unmapped (Signed)
Patient request for RX refill.

## 2017-05-28 MED ORDER — PREDNISONE 5 MG TABLET
ORAL_TABLET | Freq: Every day | ORAL | 3 refills | 0.00000 days | Status: CP
Start: 2017-05-28 — End: 2017-07-24

## 2017-05-29 ENCOUNTER — Other Ambulatory Visit: Payer: Self-pay

## 2017-05-29 ENCOUNTER — Encounter (INDEPENDENT_AMBULATORY_CARE_PROVIDER_SITE_OTHER): Payer: Self-pay | Admitting: Vascular Surgery

## 2017-05-29 ENCOUNTER — Inpatient Hospital Stay: Payer: Medicare HMO | Attending: Hematology and Oncology

## 2017-05-29 ENCOUNTER — Inpatient Hospital Stay: Payer: Medicare HMO

## 2017-05-29 ENCOUNTER — Inpatient Hospital Stay (HOSPITAL_BASED_OUTPATIENT_CLINIC_OR_DEPARTMENT_OTHER): Payer: Medicare HMO | Admitting: Hematology and Oncology

## 2017-05-29 ENCOUNTER — Encounter (INDEPENDENT_AMBULATORY_CARE_PROVIDER_SITE_OTHER): Payer: Self-pay

## 2017-05-29 ENCOUNTER — Encounter: Payer: Self-pay | Admitting: Hematology and Oncology

## 2017-05-29 ENCOUNTER — Telehealth: Payer: Self-pay

## 2017-05-29 ENCOUNTER — Ambulatory Visit (INDEPENDENT_AMBULATORY_CARE_PROVIDER_SITE_OTHER): Payer: Medicare HMO | Admitting: Vascular Surgery

## 2017-05-29 VITALS — BP 163/70 | HR 64 | Temp 97.1°F | Resp 18 | Wt 123.0 lb

## 2017-05-29 VITALS — BP 136/61 | HR 69 | Resp 16 | Ht 65.74 in | Wt 123.0 lb

## 2017-05-29 DIAGNOSIS — I272 Pulmonary hypertension, unspecified: Secondary | ICD-10-CM | POA: Diagnosis not present

## 2017-05-29 DIAGNOSIS — Z7951 Long term (current) use of inhaled steroids: Secondary | ICD-10-CM | POA: Diagnosis not present

## 2017-05-29 DIAGNOSIS — C801 Malignant (primary) neoplasm, unspecified: Secondary | ICD-10-CM

## 2017-05-29 DIAGNOSIS — Z79899 Other long term (current) drug therapy: Secondary | ICD-10-CM | POA: Insufficient documentation

## 2017-05-29 DIAGNOSIS — D47Z1 Post-transplant lymphoproliferative disorder (PTLD): Secondary | ICD-10-CM | POA: Diagnosis not present

## 2017-05-29 DIAGNOSIS — Z940 Kidney transplant status: Secondary | ICD-10-CM | POA: Diagnosis not present

## 2017-05-29 DIAGNOSIS — D649 Anemia, unspecified: Secondary | ICD-10-CM

## 2017-05-29 DIAGNOSIS — I770 Arteriovenous fistula, acquired: Secondary | ICD-10-CM | POA: Diagnosis not present

## 2017-05-29 DIAGNOSIS — K219 Gastro-esophageal reflux disease without esophagitis: Secondary | ICD-10-CM

## 2017-05-29 DIAGNOSIS — C7800 Secondary malignant neoplasm of unspecified lung: Secondary | ICD-10-CM

## 2017-05-29 DIAGNOSIS — I481 Persistent atrial fibrillation: Secondary | ICD-10-CM | POA: Diagnosis not present

## 2017-05-29 DIAGNOSIS — Z801 Family history of malignant neoplasm of trachea, bronchus and lung: Secondary | ICD-10-CM | POA: Insufficient documentation

## 2017-05-29 DIAGNOSIS — E210 Primary hyperparathyroidism: Secondary | ICD-10-CM

## 2017-05-29 DIAGNOSIS — T82868D Thrombosis of vascular prosthetic devices, implants and grafts, subsequent encounter: Secondary | ICD-10-CM

## 2017-05-29 DIAGNOSIS — N400 Enlarged prostate without lower urinary tract symptoms: Secondary | ICD-10-CM | POA: Insufficient documentation

## 2017-05-29 DIAGNOSIS — R51 Headache: Secondary | ICD-10-CM | POA: Insufficient documentation

## 2017-05-29 DIAGNOSIS — C7889 Secondary malignant neoplasm of other digestive organs: Secondary | ICD-10-CM

## 2017-05-29 DIAGNOSIS — C8330 Diffuse large B-cell lymphoma, unspecified site: Secondary | ICD-10-CM

## 2017-05-29 DIAGNOSIS — C787 Secondary malignant neoplasm of liver and intrahepatic bile duct: Secondary | ICD-10-CM | POA: Insufficient documentation

## 2017-05-29 DIAGNOSIS — Z87891 Personal history of nicotine dependence: Secondary | ICD-10-CM | POA: Diagnosis not present

## 2017-05-29 DIAGNOSIS — C7951 Secondary malignant neoplasm of bone: Secondary | ICD-10-CM

## 2017-05-29 DIAGNOSIS — D696 Thrombocytopenia, unspecified: Secondary | ICD-10-CM

## 2017-05-29 DIAGNOSIS — M109 Gout, unspecified: Secondary | ICD-10-CM

## 2017-05-29 DIAGNOSIS — T8690 Unspecified complication of unspecified transplanted organ and tissue: Secondary | ICD-10-CM | POA: Diagnosis not present

## 2017-05-29 DIAGNOSIS — D72819 Decreased white blood cell count, unspecified: Secondary | ICD-10-CM

## 2017-05-29 DIAGNOSIS — Z7189 Other specified counseling: Secondary | ICD-10-CM

## 2017-05-29 DIAGNOSIS — N186 End stage renal disease: Secondary | ICD-10-CM

## 2017-05-29 DIAGNOSIS — K449 Diaphragmatic hernia without obstruction or gangrene: Secondary | ICD-10-CM

## 2017-05-29 DIAGNOSIS — R634 Abnormal weight loss: Secondary | ICD-10-CM

## 2017-05-29 DIAGNOSIS — T829XXA Unspecified complication of cardiac and vascular prosthetic device, implant and graft, initial encounter: Secondary | ICD-10-CM | POA: Diagnosis not present

## 2017-05-29 DIAGNOSIS — E785 Hyperlipidemia, unspecified: Secondary | ICD-10-CM

## 2017-05-29 DIAGNOSIS — I129 Hypertensive chronic kidney disease with stage 1 through stage 4 chronic kidney disease, or unspecified chronic kidney disease: Secondary | ICD-10-CM | POA: Diagnosis not present

## 2017-05-29 DIAGNOSIS — K4090 Unilateral inguinal hernia, without obstruction or gangrene, not specified as recurrent: Secondary | ICD-10-CM

## 2017-05-29 DIAGNOSIS — C8333 Diffuse large B-cell lymphoma, intra-abdominal lymph nodes: Secondary | ICD-10-CM

## 2017-05-29 LAB — CBC WITH DIFFERENTIAL/PLATELET
Basophils Absolute: 0 10*3/uL (ref 0–0.1)
Basophils Relative: 1 %
Eosinophils Absolute: 0.1 10*3/uL (ref 0–0.7)
Eosinophils Relative: 4 %
HCT: 27.2 % — ABNORMAL LOW (ref 40.0–52.0)
Hemoglobin: 9.1 g/dL — ABNORMAL LOW (ref 13.0–18.0)
Lymphocytes Relative: 27 %
Lymphs Abs: 0.6 10*3/uL — ABNORMAL LOW (ref 1.0–3.6)
MCH: 29.1 pg (ref 26.0–34.0)
MCHC: 33.6 g/dL (ref 32.0–36.0)
MCV: 86.5 fL (ref 80.0–100.0)
Monocytes Absolute: 0.3 10*3/uL (ref 0.2–1.0)
Monocytes Relative: 15 %
Neutro Abs: 1.2 10*3/uL — ABNORMAL LOW (ref 1.4–6.5)
Neutrophils Relative %: 53 %
Platelets: 101 10*3/uL — ABNORMAL LOW (ref 150–440)
RBC: 3.14 MIL/uL — ABNORMAL LOW (ref 4.40–5.90)
RDW: 17.2 % — ABNORMAL HIGH (ref 11.5–14.5)
WBC: 2.2 10*3/uL — ABNORMAL LOW (ref 3.8–10.6)

## 2017-05-29 LAB — COMPREHENSIVE METABOLIC PANEL
ALT: 16 U/L — ABNORMAL LOW (ref 17–63)
AST: 34 U/L (ref 15–41)
Albumin: 3.7 g/dL (ref 3.5–5.0)
Alkaline Phosphatase: 84 U/L (ref 38–126)
Anion gap: 7 (ref 5–15)
BUN: 20 mg/dL (ref 6–20)
CO2: 24 mmol/L (ref 22–32)
Calcium: 9.8 mg/dL (ref 8.9–10.3)
Chloride: 102 mmol/L (ref 101–111)
Creatinine, Ser: 1.02 mg/dL (ref 0.61–1.24)
GFR calc Af Amer: >60 mL/min (ref >60)
GFR calc non Af Amer: >60 mL/min (ref >60)
Glucose, Bld: 101 mg/dL — ABNORMAL HIGH (ref 65–99)
Potassium: 3.9 mmol/L (ref 3.5–5.1)
Sodium: 133 mmol/L — ABNORMAL LOW (ref 135–145)
Total Bilirubin: 0.6 mg/dL (ref 0.3–1.2)
Total Protein: 6.2 g/dL — ABNORMAL LOW (ref 6.5–8.1)

## 2017-05-29 LAB — URIC ACID: Uric Acid, Serum: 4.2 mg/dL — ABNORMAL LOW (ref 4.4–7.6)

## 2017-05-29 MED ORDER — OXYCODONE-ACETAMINOPHEN 5-325 MG PO TABS: 1.0000 | ORAL_TABLET | Freq: Four times a day (QID) | ORAL | 0 refills | Status: DC | PRN

## 2017-05-29 MED ORDER — HEPARIN SOD (PORK) LOCK FLUSH 100 UNIT/ML IV SOLN: 500.0000 [IU] | Freq: Once | INTRAVENOUS | Status: AC

## 2017-05-29 MED ORDER — SODIUM CHLORIDE 0.9% FLUSH
10.0000 mL | Freq: Once | INTRAVENOUS | Status: AC
  Filled 2017-05-29: qty 10

## 2017-05-29 NOTE — Progress Notes (Signed)
Patient here for follow up today with labs and treatment. He states that he is having significant pain in his lower abdomen and groin on the right and the left. He attributes some of this pain to having a hernia and states that it is "loose".

## 2017-05-29 NOTE — Progress Notes (Signed)
Subjective:    Patient ID: Matthew Brown, male    DOB: April 26, 1941, 76 y.o.   MRN: [id number] Chief Complaint  Patient presents with  . New Patient (Initial Visit)    painful fistula   Patient presents with a chief complaint of continued right upper extremity pain.  The patient was seen approximately 3-4 months ago complaining of pain along his right AV fistula site.  The patient is status post a successful kidney transplantation in May 2006.  The patient has not needed hemodialysis since then.  Over the last few months.  The patient has been experiencing aggressively worsening pain along his thrombosed right forearm AV fistula.  The patient notes his discomfort has progressed to the point he is unable to function on a daily basis and has become lifestyle limiting.  The patient notes numbness and a coldness to his right hand as well.  Patient denies any ulcer formation to the right hand.  Patient denies any fever, nausea or vomiting.   Review of Systems  Constitutional: Negative.   HENT: Negative.   Eyes: Negative.   Respiratory: Negative.   Cardiovascular:       Right painful thrombosed AV fistula  Gastrointestinal: Negative.   Endocrine: Negative.   Genitourinary: Negative.   Musculoskeletal: Negative.   Skin: Negative.   Allergic/Immunologic: Negative.   Neurological: Negative.   Hematological: Negative.   Psychiatric/Behavioral: Negative.       Objective:   Physical Exam  Constitutional: He is oriented to person, place, and time. He appears well-developed. No distress.  HENT:  Head: Normocephalic and atraumatic.  Eyes: Conjunctivae are normal. Pupils are equal, round, and reactive to light.  Neck: Normal range of motion.  Cardiovascular: Normal rate, regular rhythm, normal heart sounds and intact distal pulses.  Pulses:      Radial pulses are 2+ on the right side, and 2+ on the left side.  Right hand is cooler when compared to the left.  Palpable 2+ radial pulses  bilaterally. No ulcer formation to the right hand. There is no bruit or thrill to the right forearm IV fistula.  Pulmonary/Chest: Effort normal and breath sounds normal.  Musculoskeletal: Normal range of motion. He exhibits no edema.  Neurological: He is alert and oriented to person, place, and time.  Skin: Skin is warm and dry. He is not diaphoretic.  Psychiatric: He has a normal mood and affect. His behavior is normal. Judgment and thought content normal.  Vitals reviewed.  BP 136/61 (BP Location: Left Arm)   Pulse 69   Resp 16   Ht 5' 5.74" (1.67 m)   Wt 123 lb (55.8 kg)   BMI 20.01 kg/m   Past Medical History:  Diagnosis Date  . Benign prostatic hypertrophy   . Chronic headache 10/19/2015  . ED (erectile dysfunction)   . End stage renal disease ([place])   . Essential hypertension   . GERD (gastroesophageal reflux disease)   . GIB (gastrointestinal bleeding)    a. 10/8525 s/p R colic artery embolization;  b. 02/2015 EGD: duod ulcerative mass->Bx notable for coagulative necrosis - ? ischemia vs thrombosis-->coumadin d/c'd.  . Gout   . Hearing loss   . Hemorrhoids   . Hyperlipidemia   . Lymphoma ([place])   . Lymphoma ([place]) 2017  . Multiple thyroid nodules 06/06/2016   Noted on carotid US; dedicated US to be ordered by staff  . Osteoarthrosis, unspecified whether generalized or localized, lower leg   . Persistent atrial fibrillation ([place])  a. CHA2DS2VASc = 3-->coumadin d/c'd 02/2015 2/2 recurrent GIB.  [name] Prostatitis   . Pulmonary hypertension ([place])    a. 10/2014 Echo: EF 60-65%, mild to mod MR, mildly dil LA, nl RV, PASP 99mmHg.  [name] Renal transplant recipient   . Ulcers of both great toes [facility])    Social History   Socioeconomic History  . Marital status: Divorced    Spouse name: Not on file  . Number of children: Not on file  . Years of education: Not on file  . Highest education level: Not on file  Social Needs  . Financial resource strain: Not on file  . Food insecurity  - worry: Not on file  . Food insecurity - inability: Not on file  . Transportation needs - medical: Not on file  . Transportation needs - non-medical: Not on file  Occupational History  . Occupation: Retired    Fish farm manager: RETIRED  Tobacco Use  . Smoking status: Former Smoker    Packs/day: 1.00    Years: 25.00    Pack years: 25.00    Types: Cigarettes    Last attempt to quit: 06/23/1978    Years since quitting: 38.9  . Smokeless tobacco: Never Used  Substance and Sexual Activity  . Alcohol use: No  . Drug use: No  . Sexual activity: Yes  Other Topics Concern  . Not on file  Social History Narrative   Divorced   Does not get regular exercise   Past Surgical History:  Procedure Laterality Date  . AV FISTULA PLACEMENT  1998  . BACK SURGERY    . ESOPHAGOGASTRODUODENOSCOPY  03/13/15   severe esophagitis, ulcerated mass  . ESOPHAGOGASTRODUODENOSCOPY (EGD) WITH PROPOFOL N/A 07/09/2016   Procedure: ESOPHAGOGASTRODUODENOSCOPY (EGD) WITH PROPOFOL;  Surgeon: [name], MD;  Location: [facility] ENDOSCOPY;  Service: Endoscopy;  Laterality: N/A;  . HERNIA REPAIR  1974  . KIDNEY TRANSPLANT  2006  . PERIPHERAL VASCULAR CATHETERIZATION N/A 05/07/2016   Procedure: Glori Luis Cath Insertion;  Surgeon: Algernon Huxley, MD;  Location: [place] [facility];  Service: Cardiovascular;  Laterality: N/A;  . PROSTATE ABLATION    . STOMACH SURGERY     blood vessel burst  . THROAT SURGERY    . TOTAL KNEE ARTHROPLASTY     Family History  Problem Relation Age of Onset  . Cancer Mother        throat  . Diabetes Brother   . Heart disease Brother   . Stroke Brother   . Hypertension Brother   . Diabetes Sister   . Heart disease Sister   . Hypertension Sister   . Diabetes Sister   . Diabetes Brother   . COPD Neg Hx   . Kidney disease Neg Hx   . Prostate cancer Neg Hx   . Kidney cancer Neg Hx   . Bladder Cancer Neg Hx    No Known Allergies     Assessment & Plan:  Patient presents with a chief complaint of  continued right upper extremity pain.  The patient was seen approximately 3-4 months ago complaining of pain along his right AV fistula site.  The patient is status post a successful kidney transplantation in May 2006.  The patient has not needed hemodialysis since then.  Over the last few months.  The patient has been experiencing aggressively worsening pain along his thrombosed right forearm AV fistula.  The patient notes his discomfort has progressed to the point he is unable to function on a daily basis and has  become lifestyle limiting.  The patient notes numbness and a coldness to his right hand as well.  Patient denies any ulcer formation to the right hand.  Patient denies any fever, nausea or vomiting.  1. Complication of vascular access for dialysis, initial encounter - Stable The patient has experienced worsening pain along his thrombosed right AV fistula. His pain has progressed to the point it with his ability to function on a daily basis The patient is not a candidate for a fistulogram as he is a renal transplant recipient and the dye used is nephrotoxic We had a long discussion about this and he expresses understanding We also had a long discussion about the risks associated with surgery and the patient is willing to accept them as his pain has progressively worsened. We will plan on excision of the patient's right thrombosed AV fistula. Procedure, risks and benefits explained to the patient All questions answered Patient and his wife who was present during the examination are willing to proceed  2. Renal transplant recipient - Stable Successful renal transplant recipient since May 2006.  Current Outpatient Medications on File Prior to Visit  Medication Sig Dispense Refill  . acetaminophen (TYLENOL) 325 MG tablet Take 2 tablets (650 mg total) by mouth every 6 (six) hours as needed for mild pain (or Fever >/= 101).    [name] albuterol (PROAIR HFA) 108 (90 BASE) MCG/ACT inhaler Inhale 1-2  puffs into the lungs every 4 (four) hours as needed.     [name] allopurinol (ZYLOPRIM) 100 MG tablet Take 2 tablets (200 mg total) by mouth daily. 60 tablet 2  . COLCRYS 0.6 MG tablet Take 1 tablet by mouth 2 (two) times daily as needed (gout).     . finasteride (PROSCAR) 5 MG tablet Take 1 tablet (5 mg total) by mouth daily. 90 tablet 3  . furosemide (LASIX) 20 MG tablet Take 20 mg by mouth every other day.     . hydroxypropyl methylcellulose (ISOPTO TEARS) 2.5 % ophthalmic solution Place 1 drop into both eyes as needed.     . megestrol (MEGACE) 400 MG/10ML suspension Take 5 mLs (200 mg total) by mouth daily. 240 mL 0  . metoprolol tartrate (LOPRESSOR) 25 MG tablet Take 12.5 mg by mouth 2 (two) times daily.     . Multiple Vitamin (MULTIVITAMIN) tablet Take 1 tablet by mouth daily.      [name] oxybutynin (DITROPAN-XL) 5 MG 24 hr tablet Take 1 tablet (5 mg total) by mouth daily. 90 tablet 3  . pantoprazole (PROTONIX) 40 MG tablet TAKE 1 TABLET BY MOUTH TWICE A DAY 60 tablet 1  . predniSONE (DELTASONE) 5 MG tablet Take 5 mg by mouth daily.     . simethicone (MYLICON) 80 MG chewable tablet Chew 1 tablet (80 mg total) by mouth every 6 (six) hours as needed for flatulence. 120 tablet 0  . sucralfate (CARAFATE) 1 g tablet Take 1 tablet (1 g total) by mouth 4 (four) times daily. Resume taking after one week- once finished taking oral levaquine. ( to avoid interaction.) 40 tablet 3  . tacrolimus (PROGRAF) 1 MG capsule Take 3 mg by mouth 2 (two) times daily. Reported on 08/16/2015    . tamsulosin (FLOMAX) 0.4 MG CAPS capsule Take 1 capsule (0.4 mg total) by mouth daily. 90 capsule 3  . diphenhydrAMINE (BENADRYL) 25 mg capsule Take 1 capsule (25 mg total) by mouth at bedtime as needed for sleep. (Patient not taking: Reported on 04/10/2017) 30 capsule 0  .  lenalidomide (REVLIMID) 5 MG capsule Take 1 capsule (5 mg total) daily by mouth. for 21 days then off 7 days. Do NOT start until instructed by MD (Patient not taking:  Reported on 05/22/2017) 21 capsule 0  . ondansetron (ZOFRAN) 4 MG tablet Take 1 tablet (4 mg total) by mouth every 6 (six) hours as needed for nausea. (Patient not taking: Reported on 04/10/2017) 20 tablet 0   Current Facility-Administered Medications on File Prior to Visit  Medication Dose Route Frequency Provider Last Rate Last Dose  . heparin lock flush 100 unit/mL  500 Units Intravenous Once Corcoran, Melissa C, MD      . heparin lock flush 100 unit/mL  500 Units Intravenous Once Corcoran, Melissa C, MD      . heparin lock flush 100 unit/mL  500 Units Intravenous Once Corcoran, Melissa C, MD      . sodium chloride flush (NS) 0.9 % injection 10 mL  10 mL Intracatheter PRN Corcoran, Melissa C, MD      . sodium chloride flush (NS) 0.9 % injection 10 mL  10 mL Intravenous PRN Corcoran, Melissa C, MD      . sodium chloride flush (NS) 0.9 % injection 10 mL  10 mL Intravenous Once Lequita Asal, MD       There are no Patient Instructions on file for this visit. No Follow-up on file.  Joeziah Voit A Layaan Mott, PA-C

## 2017-05-29 NOTE — Telephone Encounter (Signed)
Unable to leave message for the patient. Dr Jamal Collin is able to see the patient on 06/02/17 to assess his hernia.

## 2017-05-29 NOTE — Progress Notes (Signed)
[facility] day:  05/29/2017   Chief Complaint: KAMALI NEPHEW is a 76 y.o. male with post-transplant lymphoproliferative disorder, stage IVBE diffuse large B cell lymphoma, who is seen for assessment prior to cycle #2 Rituxan and Revlimid.  HPI:  The patient was last seen in the medical oncology clinic by me on 05/22/2017.  At that time, he felt "ok".  He had lost 5 pounds since his last visit. He was using Megace, which was effective in stimulation of his appetite. He denied any fevers or sweats. Examed reveal bilateral inguinal hernias that are not incarcerated.  WBC was 2100 with an [place] is 1300. Hemoglobin was 9.4, hematocrit 27.8, platelets 99,000. Creatinine was 1.21. LDH was 334.  TSH was normal.  He was referred to Dr. Lucky Cowboy for his painful thrombosed AV fistula.  During the interim, patient notes that he is in pain. Pain rated 8/10 today. Patient continues to have significant pain in the RIGHT upper extremity AV fistula. Patient also complaining of pain in his LEFT groin. He states, "my hernia has come out". Patient denies any B symptoms or interval infections. Patient is eating well; states, "I am eating like a wild boar". Weight is up 4 pounds.    Past Medical History:  Diagnosis Date  . Benign prostatic hypertrophy   . Chronic headache 10/19/2015  . ED (erectile dysfunction)   . End stage renal disease ([place])   . Essential hypertension   . GERD (gastroesophageal reflux disease)   . GIB (gastrointestinal bleeding)    a. 09/1322 s/p R colic artery embolization;  b. 02/2015 EGD: duod ulcerative mass->Bx notable for coagulative necrosis - ? ischemia vs thrombosis-->coumadin d/c'd.  . Gout   . Hearing loss   . Hemorrhoids   . Hyperlipidemia   . Lymphoma ([place])   . Lymphoma ([place]) 2017  . Multiple thyroid nodules 06/06/2016   Noted on carotid US; dedicated US to be ordered by staff  . Osteoarthrosis, unspecified whether generalized or localized,  lower leg   . Persistent atrial fibrillation ([place])    a. CHA2DS2VASc = 3-->coumadin d/c'd 02/2015 2/2 recurrent GIB.  [name] Prostatitis   . Pulmonary hypertension ([place])    a. 10/2014 Echo: EF 60-65%, mild to mod MR, mildly dil LA, nl RV, PASP 75mHg.  .Marland KitchenRenal transplant recipient   . Ulcers of both great toes ([facility]     Past Surgical History:  Procedure Laterality Date  . AV FISTULA PLACEMENT  1998  . BACK SURGERY    . ESOPHAGOGASTRODUODENOSCOPY  03/13/15   severe esophagitis, ulcerated mass  . ESOPHAGOGASTRODUODENOSCOPY (EGD) WITH PROPOFOL N/A 07/09/2016   Procedure: ESOPHAGOGASTRODUODENOSCOPY (EGD) WITH PROPOFOL;  Surgeon: KJonathon Bellows MD;  Location: [facility] ENDOSCOPY;  Service: Endoscopy;  Laterality: N/A;  . HERNIA REPAIR  1974  . KIDNEY TRANSPLANT  2006  . PERIPHERAL VASCULAR CATHETERIZATION N/A 05/07/2016   Procedure: PGlori LuisCath Insertion;  Surgeon: JAlgernon Huxley MD;  [facility];  Service: Cardiovascular;  Laterality: N/A;  . PROSTATE ABLATION    . STOMACH SURGERY     blood vessel burst  . THROAT SURGERY    . TOTAL KNEE ARTHROPLASTY      Family History  Problem Relation Age of Onset  . Cancer Mother        throat  . Diabetes Brother   . Heart disease Brother   . Stroke Brother   . Hypertension Brother   . Diabetes Sister   .  Heart disease Sister   . Hypertension Sister   . Diabetes Sister   . Diabetes Brother   . COPD Neg Hx   . Kidney disease Neg Hx   . Prostate cancer Neg Hx   . Kidney cancer Neg Hx   . Bladder Cancer Neg Hx     Social History:  reports that he quit smoking about 38 years ago. His smoking use included cigarettes. He has a 25.00 pack-year smoking history. he has never used smokeless tobacco. He reports that he does not drink alcohol or use drugs.  He stopped smoking in 1981.  He smoked 3 cigarettes/day.  He lives in [place].  The patient is accompanied by his wife, [name],  today.  Allergies: No Known Allergies  Current  Medications: Current Outpatient Medications  Medication Sig Dispense Refill  . acetaminophen (TYLENOL) 325 MG tablet Take 2 tablets (650 mg total) by mouth every 6 (six) hours as needed for mild pain (or Fever >/= 101).    [name] albuterol (PROAIR HFA) 108 (90 BASE) MCG/ACT inhaler Inhale 1-2 puffs into the lungs every 4 (four) hours as needed.     [name] allopurinol (ZYLOPRIM) 100 MG tablet Take 2 tablets (200 mg total) by mouth daily. 60 tablet 2  . COLCRYS 0.6 MG tablet Take 1 tablet by mouth 2 (two) times daily as needed (gout).     . finasteride (PROSCAR) 5 MG tablet Take 1 tablet (5 mg total) by mouth daily. 90 tablet 3  . furosemide (LASIX) 20 MG tablet Take 20 mg by mouth every other day.     . hydroxypropyl methylcellulose (ISOPTO TEARS) 2.5 % ophthalmic solution Place 1 drop into both eyes as needed.     . megestrol (MEGACE) 400 MG/10ML suspension Take 5 mLs (200 mg total) by mouth daily. 240 mL 0  . metoprolol tartrate (LOPRESSOR) 25 MG tablet Take 12.5 mg by mouth 2 (two) times daily.     . Multiple Vitamin (MULTIVITAMIN) tablet Take 1 tablet by mouth daily.      [name] oxybutynin (DITROPAN-XL) 5 MG 24 hr tablet Take 1 tablet (5 mg total) by mouth daily. 90 tablet 3  . oxyCODONE-acetaminophen (ROXICET) 5-325 MG tablet Take 1 tablet every 6 (six) hours as needed by mouth. 30 tablet 0  . pantoprazole (PROTONIX) 40 MG tablet TAKE 1 TABLET BY MOUTH TWICE A DAY 60 tablet 1  . predniSONE (DELTASONE) 5 MG tablet Take 5 mg by mouth daily.     . simethicone (MYLICON) 80 MG chewable tablet Chew 1 tablet (80 mg total) by mouth every 6 (six) hours as needed for flatulence. 120 tablet 0  . sucralfate (CARAFATE) 1 g tablet Take 1 tablet (1 g total) by mouth 4 (four) times daily. Resume taking after one week- once finished taking oral levaquine. ( to avoid interaction.) 40 tablet 3  . tacrolimus (PROGRAF) 1 MG capsule Take 3 mg by mouth 2 (two) times daily. Reported on 08/16/2015    . tamsulosin (FLOMAX) 0.4 MG  CAPS capsule Take 1 capsule (0.4 mg total) by mouth daily. 90 capsule 3  . diphenhydrAMINE (BENADRYL) 25 mg capsule Take 1 capsule (25 mg total) by mouth at bedtime as needed for sleep. (Patient not taking: Reported on 04/10/2017) 30 capsule 0  . lenalidomide (REVLIMID) 5 MG capsule Take 1 capsule (5 mg total) daily by mouth. for 21 days then off 7 days. Do NOT start until instructed by MD (Patient not taking: Reported on 05/22/2017) 21 capsule 0  .  ondansetron (ZOFRAN) 4 MG tablet Take 1 tablet (4 mg total) by mouth every 6 (six) hours as needed for nausea. (Patient not taking: Reported on 04/10/2017) 20 tablet 0   No current facility-administered medications for this visit.    Facility-Administered Medications Ordered in Other Visits  Medication Dose Route Frequency Provider Last Rate Last Dose  . heparin lock flush 100 unit/mL  500 Units Intravenous Once Corcoran, Melissa C, MD      . heparin lock flush 100 unit/mL  500 Units Intravenous Once Corcoran, Melissa C, MD      . heparin lock flush 100 unit/mL  500 Units Intravenous Once Corcoran, Melissa C, MD      . sodium chloride flush (NS) 0.9 % injection 10 mL  10 mL Intracatheter PRN Corcoran, Melissa C, MD      . sodium chloride flush (NS) 0.9 % injection 10 mL  10 mL Intravenous PRN Corcoran, Melissa C, MD      . sodium chloride flush (NS) 0.9 % injection 10 mL  10 mL Intravenous Once Lequita Asal, MD        Review of Systems:  GENERAL:  Feels "ok".  No fevers or sweats.  Weight up 4 pounds.   PERFORMANCE STATUS (ECOG):  1 HEENT:  No visual changes, sore throat, mouth sores or tenderness. Lungs: No shortness of breath or cough.  No hemoptysis. Cardiac:  No chest pain, palpitations, orthopnea, or PND. GI:  Appetite good and eating well on Megace.  No nausea, vomiting, diarrhea, constipation, melena or hematochezia. GU:  Enlarged prostate.  No urgency, frequency, dysuria, or hematuria.  Left inguinal hernia (see  HPI). Musculoskeletal: No back pain.  Right knee pain.  No muscle tenderness. Extremities:  No pain or swelling. Skin:  Nail changes.  No rashes or skin changes. Neuro:  Forgetful.  No headache, numbness or weakness, balance or coordination issues. Endocrine:  No diabetes, thyroid issues, hot flashes or night sweats. Psych:  No mood changes, depression or anxiety. Pain:  Hernia bothersome. Pain in RIGHT upper extremity AV fistula. Pain 8/10 today.    Review of systems:  All other systems reviewed and found to be negative.  Physical Exam: Blood pressure (!) 163/70, pulse 64, temperature (!) 97.1 F (36.2 C), temperature source Tympanic, resp. rate 18, weight 123 lb (55.8 kg). GENERAL:  Thin elderly gentleman sitting comfortably in the exam room in no acute distress.  MENTAL STATUS:  Alert and oriented to person, place and time. HEAD:  Wearing a black cap.  Lu Duffel.  Temporal wasting.  Normocephalic, atraumatic, face symmetric, no Cushingoid features. EYES:  Glasses.  Brown eyes.  Pupils equal round and reactive to light and accomodation.  No conjunctivitis or scleral icterus. ENT:  Oropharynx clear without lesion.  Edentulous.  Tongue normal. Mucous membranes moist.  RESPIRATORY:  Clear to auscultation without rales, wheezes or rhonchi. CARDIOVASCULAR:  Regular rate and rhythm without murmur, rub or gallop. ABDOMEN:  Soft, non-tender, with active bowel sounds, and no hepatosplenomegaly.  No masses.  Palpable transplanted kidney.  SKIN:  No rashes, ulcers or lesions. EXTREMITIES:  No edema, no skin discoloration or tenderness.  Right upper extremity with dilated vascular s/p AV fistula, no thrill (chronic).  No palpable cords. NEUROLOGICAL: Unremarkable. PSYCH:  Appropriate.    Imaging studies:  04/28/2016 PET scan:  Bulky intensely hypermetabolic periaortic upper abdominal and mesenteric adenopathy concerning for high-grade lymphoma.  There was hypermetabolic liver metastasis.  There  was hpermetabolic lesion involving the small  bowel of the upper pelvis.  There were multiple sites of hypermetabolic skeletal metastasis. There was moderate volume right pneumothorax. 08/12/2016 PET scan:  Interval response to therapy. There has been significant decrease in extent of hypermetabolic tumor within the neck, chest, abdomen and pelvis as well as the axial and appendicular skeleton.  There was residual enlarged and hypermetabolic small bowel mesenteric and periaortic lymph nodes. There was a persistent hypermetabolic focus of increased uptake within the spleen.  There was resolution of previous multifocal hypermetabolic lesions within the liver. 10/10/2016 PET scan:  Slight worsening compared to the prior study. Several lesions were slightly more hypermetabolic and there appeared to be a newly enlarged and hypermetabolic mesenteric node adjacent to one of the previous mesenteric lymph nodes. However, there are no new hypermetabolic lesions in the neck, chest, or skeleton.  The small hypermetabolic peripheral lesion of the right kidney upper pole is slightly less hypermetabolic. 05/39/7673 PET scan:  Marked progression of disease as evidenced by progressive bulky mesenteric adenopathy, new low left internal jugular/left juxta diaphragmatic/abdominal retroperitoneal adenopathy, new pulmonary nodules, new hepatic and splenic lesions and new peritoneal nodules, all of which are hypermetabolic 41/93/7902 CT abdomen and pelvis: Slight increased size of left lower lobe pulmonary metastatic nodule. Re-demonstrated hypodense liver masses and splenic masses, consistent with metastatic disease, suspect increased in size compared to prior PET-CT. Bulky mesenteric and retroperitoneal masses consistent with metastatic disease, also increased in size compared to recent PET-CT. Atrophic native kidneys. Transplant kidney in the right lower quadrant. Negative for hydronephrosis. 03/08/2017:  Scrotal ultrasound  revealed soft tissue structure in the right scrotum separate from the epididymis and testicle. There was internal blood flow. The appearance could represent bowel herniated into the right side of the scrotum but there  was no peristalsis and there was no definitive hernia seen on a recent CT. 03/08/2017:  Abdomen CT revealed stable lingular and left lower lobe pulmonary metastatic nodules to the extent included.  There was slight interval increase in size of several hepatic metastatic lesions (4.3 cm vs 3.6 cm; 5.2 cm vs 4.9 cm).  There was equivocal overall change with reference to retroperitoneal and mesenteric lymphadenopathy, some areas appear to have increased in size such as the retrocaval lymph node mass (4.1 x 6.4 cm vs 6 x 3.1 cm), others remaining stable such as the left para-aortic and pelvic lymph node index masses and others appearing slightly smaller in the lower abdominal mesentery.   There was some mild nonspecific scrotal skin thickening.  There was no bowel herniation or definite intratesticular mass. Tubular densities were seen within the scrotum bilaterally which were likely related to the spermatic cord and vessels.   There was no significant hydrocele.   Infusion on 05/29/2017  Component Date Value Ref Range Status  . Sodium 05/29/2017 133* 135 - 145 mmol/L Final  . Potassium 05/29/2017 3.9  3.5 - 5.1 mmol/L Final  . Chloride 05/29/2017 102  101 - 111 mmol/L Final  . CO2 05/29/2017 24  22 - 32 mmol/L Final  . Glucose, Bld 05/29/2017 101* 65 - 99 mg/dL Final  . BUN 05/29/2017 20  6 - 20 mg/dL Final  . Creatinine, Ser 05/29/2017 1.02  0.61 - 1.24 mg/dL Final  . Calcium 05/29/2017 9.8  8.9 - 10.3 mg/dL Final  . Total Protein 05/29/2017 6.2* 6.5 - 8.1 g/dL Final  . Albumin 05/29/2017 3.7  3.5 - 5.0 g/dL Final  . AST 05/29/2017 34  15 - 41 U/L Final  . ALT  05/29/2017 16* 17 - 63 U/L Final  . Alkaline Phosphatase 05/29/2017 84  38 - 126 U/L Final  . Total Bilirubin 05/29/2017 0.6   0.3 - 1.2 mg/dL Final  . GFR calc non Af Amer 05/29/2017 >60  >60 mL/min Final  . GFR calc Af Amer 05/29/2017 >60  >60 mL/min Final   Comment: (NOTE) The eGFR has been calculated using the CKD EPI equation. This calculation has not been validated in all clinical situations. eGFR's persistently <60 mL/min signify possible Chronic Kidney Disease.   . Anion gap 05/29/2017 7  5 - 15 Final  . WBC 05/29/2017 2.2* 3.8 - 10.6 K/uL Final  . RBC 05/29/2017 3.14* 4.40 - 5.90 MIL/uL Final  . Hemoglobin 05/29/2017 9.1* 13.0 - 18.0 g/dL Final  . HCT 05/29/2017 27.2* 40.0 - 52.0 % Final  . MCV 05/29/2017 86.5  80.0 - 100.0 fL Final  . MCH 05/29/2017 29.1  26.0 - 34.0 pg Final  . MCHC 05/29/2017 33.6  32.0 - 36.0 g/dL Final  . RDW 05/29/2017 17.2* 11.5 - 14.5 % Final  . Platelets 05/29/2017 101* 150 - 440 K/uL Final  . Neutrophils Relative % 05/29/2017 53  % Final  . Neutro Abs 05/29/2017 1.2* 1.4 - 6.5 K/uL Final  . Lymphocytes Relative 05/29/2017 27  % Final  . Lymphs Abs 05/29/2017 0.6* 1.0 - 3.6 K/uL Final  . Monocytes Relative 05/29/2017 15  % Final  . Monocytes Absolute 05/29/2017 0.3  0.2 - 1.0 K/uL Final  . Eosinophils Relative 05/29/2017 4  % Final  . Eosinophils Absolute 05/29/2017 0.1  0 - 0.7 K/uL Final  . Basophils Relative 05/29/2017 1  % Final  . Basophils Absolute 05/29/2017 0.0  0 - 0.1 K/uL Final    Assessment:  WALTHER SANAGUSTIN is a 76 y.o. male s/p renal transplant (2007) with a post-transplant lymphoproliferative disorder, stage IV diffuse large B cell lymphoma.  He presented with a 2-3 month history of progressive back pain superimposed on chronic back pain.    PET scan on 04/28/2016 revealed bulky intensely hypermetabolic periaortic upper abdominal and mesenteric adenopathy concerning for high-grade lymphoma.  There was hypermetabolic liver metastasis.  There was hpermetabolic lesion involving the small bowel of the upper pelvis.  There were multiple sites of hypermetabolic  skeletal metastasis. There was moderate volume right pneumothorax.  CT guided retroperitoneal node biopsy on 04/30/2016 revealed diffuse large B cell lymphoma.  Hepatitis B and C testing were negative on 05/06/2016 and 02/03/2017.  Echo on 05/06/2016 revealed an EF of 55-60%.  Echo on 09/01/2016 revealed an EF of 60-65%.  Bone marrow aspirate and biopsy on 05/08/2016 revealed multifocal marrow involvement by diffuse large B-cell lymphoma. There was variably cellular marrow for age (50% - 90%) with a patchy predominantly nodular large B-cell infiltrate, overall estimated to account for 20% of the core biopsy. There was adequate residual trilineage hematopoiesis with mild nonspecific dyserythropoiesis. There was patchy mild increase in reticulin. Storage iron was present. The immunohistochemical staining pattern of the B-cell infiltrate (CD10 +/-, BCL 6+, BCL-2 +) suggested possible large cell transformation of follicular lymphoma.  Flow cytometry revealed no significant immunophenotypic abnormalities or evidence of B-cell lymphoma.  He has bone metastasis and hypercalcemia.  Calcium was 11.2 (ionized 7.3) on 05/13/2016.  He received Zometa on 05/13/2016.  He began [place] on 06/09/2016 (last 08/05/2016).  Work-up on 04/15/2016 revealed the following normal studies: ferritin (343), iron saturation (6%), TIBC (241; low), B12 (525), folate (27).  Reticulocyte count was  2%.  LDH was 409.  Uric acid was 7.7 (4.4 - 7.6).  He was admitted at [facility] from 04/28/2016 - 04/30/2016 with a moderate volume right sided pneumothorax.  His pneumothorax improved spontaneously. Plain films of the right femur revealed the lucent bone lesion in the right femoral neck  was poorly characterized.  There was no evidence for an acute fracture.  PTH was 106 (high) 04/30/2016 with a calcium of 11.2.  Etiology was c/w primary hyperparathyroidism.  PTH-related polypeptide was < 1.1 on 04/30/2016.  He has a history of GI bleeding in  08/2014.  He underwent tagged RBC scan which revealed an active bleed in the hepatic flexure.  He was embolized in vascular interventional radiology.  He was admitted to [facility] from 07/08/2016 - 07/13/2016 with GI bleeding.  EGD on 07/09/2016 revealed multiple non-bleeding duodenal ulcer as well as duodenitis. Tagged RBC scan on 07/11/2016 revealed active GI bleed in the lateral right mid abdomen coursing through multiple curvilinear bowel loops in the right mid abdomen favoring a small bowel source of bleeding.  He received 5 units of PRBCs, 2 units of pheresed platelets, and vitamin K from 07/07/2016 0 07/13/2016.  He was transferred to [facility] from 07/13/2016 - 07/16/2016.  [facility] performed a colonoscopy and push enteroscopy which showed several diverticula and old blood but no source of bleeding. Source of bleed was presumed to be diverticular, although it was possible a small bowel site was not visualized on endoscopy.  He has a history of renal failure s/p renal transplant.  He is tacrolimus (Prograf), and steroids  Creatinine has ranged between 1.43 - 1.93 in the past 6 months. Mycophenolate (MMF) was discontinued at diagnosis.  Prograf level was 5.5 (3.0-8.0) on 05/08/2016, 2.2 on 08/13/2016, 2.3 in 01/01/2017.  He received 6 cycles of mini-RCHOP (05/09/2016 - 09/05/2016).  Cycle #1 was complicated by fever and neutropenia.  Cycle #3 was complicated by a GI bleed.  CSF on 06/19/2016 revealed 7 WBCs (4% segs, 79% lymphs, and 17% monocytes).  He has undergone LP with IT MTX x 4 (07/24/2016, 08/14/2016, 09/04/2016, and 10/02/2016).  Cytology was negative on 07/24/2016 and 09/04/2016.  PET scan on 10/10/2016 revealed slight worsening compared to the prior study. Several lesions were slightly more hypermetabolic and there appeared to be a newly enlarged and hypermetabolic mesenteric node adjacent to one of the previous mesenteric lymph nodes. However, there are no new hypermetabolic lesions in the neck,  chest, or skeleton.  The small hypermetabolic peripheral lesion of the right kidney upper pole is slightly less hypermetabolic.  He was lost to follow-up.  PET scan on 01/14/2017 revealed marked progression of disease as evidenced by progressive bulky mesenteric adenopathy, new low left internal jugular/left juxta diaphragmatic/abdominal retroperitoneal adenopathy, new pulmonary nodules, new hepatic and splenic lesions and new peritoneal nodules, all of which are hypermetabolic.  LDH was 480 on 02/06/2017.  He received 2 cycles of gemcitabine and oxaliplatin + obinutuzumab (02/04/2017 - 02/24/2017) with Neulasta support.   Abdomen CT on 03/08/2017 revealed stable lingular and left lower lobe pulmonary metastatic nodules.  There was slight interval increase in size of several hepatic metastatic lesions (4.3 cm vs 3.6 cm; 5.2 cm vs 4.9 cm).  There was equivocal overall change with reference to retroperitoneal and mesenteric lymphadenopathy, some areas appear to have increased in size such as the retrocaval lymph node mass (4.1 x 6.4 cm vs 6 x 3.1 cm), others remaining stable such as the left para-aortic and pelvic lymph node index  masses and others appearing slightly smaller in the lower abdominal mesentery.   There was some mild nonspecific scrotal skin thickening.  Tubular densities within the scrotum bilaterally were likely related to the spermatic cord and vessels.   There was no significant hydrocele.  He is day 43 of cycle #1 Rituxan and Revlimid (03/20/2017).  Counts have not recovered.  Symptomatically, he feels "ok". Patient has gained 4 pounds since his last visit. He is on Megace. He denies any fevers or sweats.  Exam reveal bilateral inguinal hernias that are not incarcerated. He is having significant pain in his RIGHT upper extremity; AV fistula.  WBC is 2200 with an [place] is 1200. Hemoglobin is 9.1, hematocrit 27.2, platelets 101,000. Creatinine is 1.02.   Plan:  1.  Labs today:  CBC with  diff, CMP, uric acid, Prograf level. 2.  Discuss anemia. Patient is not symptomatic. Hemoglobin is 9.1. Patient will not require a blood transfusion.  4.  Discuss neutropenia. WBC 2200 with an ANC of 1200. Reinforced neutropenic precautions.   5.  No Rituxan today. Patient to follow up with Dr. Lonia Blood for further recommendations and treatment. 6.  Continue to hold Revlimid due to neutropenia. We will change Revlimid to 5 mg daily for 21 days with 7 days off (or 15 mg every 3 days if cannot return Revlimid) when restarted. 7.  Refer to vascular ([facility]) to evaluation of painful and non-functioning AV fistula. 8.  Refer to Dr. Jamal Collin for evaluation of his LEFT inguinal hernia.  9.  RTC after appointment with Dr. Lonia Blood. Patient will need call for an appointment.    Honor Loh, NP 05/29/2017, 10:11 AM   I saw and evaluated the patient, participating in the key portions of the service and reviewing pertinent diagnostic studies and records.  I reviewed the nurse practitioner's note and agree with the findings and the plan.  The assessment and plan were discussed with the patient.  Several questions were asked by the patient and answered.   Lequita Asal, MD 05/29/2017, 10:11 AM

## 2017-06-02 ENCOUNTER — Ambulatory Visit: Payer: Medicare HMO | Admitting: General Surgery

## 2017-06-02 ENCOUNTER — Other Ambulatory Visit (INDEPENDENT_AMBULATORY_CARE_PROVIDER_SITE_OTHER): Payer: Self-pay | Admitting: Vascular Surgery

## 2017-06-02 ENCOUNTER — Other Ambulatory Visit: Payer: Self-pay | Admitting: *Deleted

## 2017-06-02 ENCOUNTER — Telehealth: Payer: Self-pay | Admitting: *Deleted

## 2017-06-02 LAB — TACROLIMUS LEVEL: Tacrolimus (FK506) - LabCorp: 16.9 ng/mL (ref 2.0–20.0)

## 2017-06-02 MED ORDER — PANTOPRAZOLE SODIUM 40 MG PO TBEC: 40.0000 mg | DELAYED_RELEASE_TABLET | Freq: Two times a day (BID) | ORAL | 1 refills | Status: AC

## 2017-06-02 NOTE — Telephone Encounter (Signed)
Returned call to patient to discuss pain. Patient notes that he has continued significant pain in his LEFT groin and LEFT knee. Of note, patient was scheduled to follow up with Dr. Jamal Collin. He advises that they received a call from the surgeon's office today to ask why he missed his 10am appointment. Patient reports that he was never informed of an appointment with Dr. Jamal Collin today. When the patient was in the office last week, Dr. Mike Gip sent a message to Dr. Jamal Collin who was going to have his office call the patient with an appointment time, however this communication was inadvertently missed. Patient rescheduled for 06/04/2017.   Regarding his pain. Patient currently taking Percocet 5/325 every 6 hours PRN. Advised patient to increase to 2 tabs every 6 hours PRN and to return a call to the clinic tomorrow if this intervention did not provide some relief. Patient verbalized understanding and consent with proposed plan of care.   Also, patient to follow up with Dr. Lonia Blood at [facility]. He notes that no one has contacted him for an appointment there either. Will plan on touching base with Dr. Lonia Blood' office to follow up on patient's appointment. Message sent to Dr. Lonia Blood by Dr. Mike Gip; awaiting return call.

## 2017-06-02 NOTE — Telephone Encounter (Signed)
Leatrice called and reported that Matthew Brown is having increased pain and has not rested all night and has been taking percocet every 6 hours as ordered. Asking what else he can have

## 2017-06-03 ENCOUNTER — Encounter: Payer: Self-pay | Admitting: *Deleted

## 2017-06-03 ENCOUNTER — Inpatient Hospital Stay: Admission: RE | Admit: 2017-06-03 | Payer: Medicare HMO | Source: Ambulatory Visit

## 2017-06-04 ENCOUNTER — Ambulatory Visit: Payer: Medicare HMO | Admitting: General Surgery

## 2017-06-04 ENCOUNTER — Telehealth: Payer: Self-pay | Admitting: *Deleted

## 2017-06-04 ENCOUNTER — Encounter: Payer: Self-pay | Admitting: General Surgery

## 2017-06-04 VITALS — BP 128/72 | HR 77 | Resp 16 | Ht 68.0 in | Wt 122.0 lb

## 2017-06-04 DIAGNOSIS — K4090 Unilateral inguinal hernia, without obstruction or gangrene, not specified as recurrent: Secondary | ICD-10-CM

## 2017-06-04 NOTE — Telephone Encounter (Signed)
Wife called to discuss treatment for his "low blood counts". She would like to know if there is anything Dr. Mike Gip can do. She is aware that Dr. Jamal Collin is going to speak with her today regarding this patient.

## 2017-06-04 NOTE — Patient Instructions (Addendum)
The patient is aware to call back for any questions or concerns.  Inguinal Hernia, Adult An inguinal hernia is when fat or the intestines push through the area where the leg meets the lower belly (groin) and make a rounded lump (bulge). This condition happens over time. There are three types of inguinal hernias. These types include:  Hernias that can be pushed back into the belly (are reducible).  Hernias that cannot be pushed back into the belly (are incarcerated).  Hernias that cannot be pushed back into the belly and lose their blood supply (get strangulated). This type needs emergency surgery.  Follow these instructions at home: Lifestyle  Drink enough fluid to keep your urine (pee) clear or pale yellow.  Eat plenty of fruits, vegetables, and whole grains. These have a lot of fiber. Talk with your doctor if you have questions.  Avoid lifting heavy objects.  Avoid standing for long periods of time.  Do not use tobacco products. These include cigarettes, chewing tobacco, or e-cigarettes. If you need help quitting, ask your doctor.  Try to stay at a healthy weight. General instructions  Do not try to force the hernia back in.  Watch your hernia for any changes in color or size. Let your doctor know if there are any changes.  Take over-the-counter and prescription medicines only as told by your doctor.  Keep all follow-up visits as told by your doctor. This is important. Contact a doctor if:  You have a fever.  You have new symptoms.  Your symptoms get worse. Get help right away if:  The area where the legs meets the lower belly has: ? Pain that gets worse suddenly. ? A bulge that gets bigger suddenly and does not go down. ? A bulge that turns red or purple. ? A bulge that is painful to the touch.  You are a man and your scrotum: ? Suddenly feels painful. ? Suddenly changes in size.  You feel sick to your stomach (nauseous) and this feeling does not go  away.  You throw up (vomit) and this keeps happening.  You feel your heart beating a lot more quickly than normal.  You cannot poop (have a bowel movement) or pass gas. This information is not intended to replace advice given to you by your health care provider. Make sure you discuss any questions you have with your health care provider. Document Released: 07/10/2006 Document Revised: 11/15/2015 Document Reviewed: 04/19/2014 Elsevier Interactive Patient Education  2018 [employer].

## 2017-06-04 NOTE — Progress Notes (Signed)
Patient ID: Matthew Brown, male   DOB: [birth year], 76 y.o.   MRN: [id number]  Chief Complaint  Patient presents with  . Hernia    HPI Matthew Brown is a 76 y.o. male.  Patient here today for an evaluation of a left inguinal hernia.  He states that they have noticed for about 10 months.  It does seem to be causing some left lower abdominal pain. The area pops in and out. He holds the area when he walks. No nausea, vomiting, constipation or diarrhea noted. Moves his bowels daioly.  His last chemotherapy treatment was 6 weeks ago for lymphoma.  HPI  Past Medical History:  Diagnosis Date  . Benign prostatic hypertrophy   . Chronic headache 10/19/2015  . ED (erectile dysfunction)   . End stage renal disease ([place])   . Essential hypertension   . GERD (gastroesophageal reflux disease)   . GIB (gastrointestinal bleeding)    a. 09/7827 s/p R colic artery embolization;  b. 02/2015 EGD: duod ulcerative mass->Bx notable for coagulative necrosis - ? ischemia vs thrombosis-->coumadin d/c'd.  . Gout   . Hearing loss   . Hemorrhoids   . Hyperlipidemia   . Lymphoma ([place])   . Lymphoma ([place]) 2017  . Multiple thyroid nodules 06/06/2016   Noted on carotid US; dedicated US to be ordered by staff  . Osteoarthrosis, unspecified whether generalized or localized, lower leg   . Persistent atrial fibrillation ([place])    a. CHA2DS2VASc = 3-->coumadin d/c'd 02/2015 2/2 recurrent GIB.  [name] Prostatitis   . Pulmonary hypertension ([place])    a. 10/2014 Echo: EF 60-65%, mild to mod MR, mildly dil LA, nl RV, PASP 87mmHg.  [name] Renal transplant recipient   . Ulcers of both great toes [facility])     Past Surgical History:  Procedure Laterality Date  . AV FISTULA PLACEMENT  1998  . BACK SURGERY    . ESOPHAGOGASTRODUODENOSCOPY  03/13/15   severe esophagitis, ulcerated mass  . ESOPHAGOGASTRODUODENOSCOPY (EGD) WITH PROPOFOL N/A 07/09/2016   Procedure: ESOPHAGOGASTRODUODENOSCOPY (EGD) WITH PROPOFOL;  Surgeon: [name], MD;  Location:  [facility] ENDOSCOPY;  Service: Endoscopy;  Laterality: N/A;  . HERNIA REPAIR  1974  . KIDNEY TRANSPLANT  2006  . PERIPHERAL VASCULAR CATHETERIZATION N/A 05/07/2016   Procedure: Glori Luis Cath Insertion;  Surgeon: Algernon Huxley, MD;  Location: [place] CV LAB;  Service: Cardiovascular;  Laterality: N/A;  . PROSTATE ABLATION    . STOMACH SURGERY     blood vessel burst  . THROAT SURGERY    . TOTAL KNEE ARTHROPLASTY      Family History  Problem Relation Age of Onset  . Cancer Mother        throat  . Diabetes Brother   . Heart disease Brother   . Stroke Brother   . Hypertension Brother   . Diabetes Sister   . Heart disease Sister   . Hypertension Sister   . Diabetes Sister   . Diabetes Brother   . COPD Neg Hx   . Kidney disease Neg Hx   . Prostate cancer Neg Hx   . Kidney cancer Neg Hx   . Bladder Cancer Neg Hx     Social History Social History   Tobacco Use  . Smoking status: Former Smoker    Packs/day: 1.00    Years: 25.00    Pack years: 25.00    Types: Cigarettes    Last attempt to quit: 06/23/1978    Years since quitting: 38.9  .  Smokeless tobacco: Never Used  Substance Use Topics  . Alcohol use: No  . Drug use: No    No Known Allergies  Current Outpatient Medications  Medication Sig Dispense Refill  . acetaminophen (TYLENOL) 325 MG tablet Take 2 tablets (650 mg total) by mouth every 6 (six) hours as needed for mild pain (or Fever >/= 101).    [name] albuterol (PROAIR HFA) 108 (90 BASE) MCG/ACT inhaler Inhale 1-2 puffs into the lungs every 4 (four) hours as needed.     [name] allopurinol (ZYLOPRIM) 100 MG tablet Take 2 tablets (200 mg total) by mouth daily. 60 tablet 2  . COLCRYS 0.6 MG tablet Take 1 tablet by mouth 2 (two) times daily as needed (gout).     [name] diphenhydrAMINE (BENADRYL) 25 mg capsule Take 1 capsule (25 mg total) by mouth at bedtime as needed for sleep. 30 capsule 0  . finasteride (PROSCAR) 5 MG tablet Take 1 tablet (5 mg total) by mouth daily. 90 tablet 3  .  furosemide (LASIX) 20 MG tablet Take 20 mg by mouth every other day.     . hydroxypropyl methylcellulose (ISOPTO TEARS) 2.5 % ophthalmic solution Place 1 drop into both eyes as needed.     [name] lenalidomide (REVLIMID) 5 MG capsule Take 1 capsule (5 mg total) daily by mouth. for 21 days then off 7 days. Do NOT start until instructed by MD 21 capsule 0  . megestrol (MEGACE) 400 MG/10ML suspension Take 5 mLs (200 mg total) by mouth daily. 240 mL 0  . metoprolol tartrate (LOPRESSOR) 25 MG tablet Take 12.5 mg by mouth 2 (two) times daily.     . Multiple Vitamin (MULTIVITAMIN) tablet Take 1 tablet by mouth daily.      . ondansetron (ZOFRAN) 4 MG tablet Take 1 tablet (4 mg total) by mouth every 6 (six) hours as needed for nausea. 20 tablet 0  . oxybutynin (DITROPAN-XL) 5 MG 24 hr tablet Take 1 tablet (5 mg total) by mouth daily. 90 tablet 3  . oxyCODONE-acetaminophen (ROXICET) 5-325 MG tablet Take 1 tablet by mouth every 6 (six) hours as needed. 30 tablet 0  . pantoprazole (PROTONIX) 40 MG tablet Take 1 tablet (40 mg total) by mouth 2 (two) times daily. 60 tablet 1  . predniSONE (DELTASONE) 5 MG tablet Take 5 mg by mouth daily.     . simethicone (MYLICON) 80 MG chewable tablet Chew 1 tablet (80 mg total) by mouth every 6 (six) hours as needed for flatulence. 120 tablet 0  . sucralfate (CARAFATE) 1 g tablet Take 1 tablet (1 g total) by mouth 4 (four) times daily. Resume taking after one week- once finished taking oral levaquine. ( to avoid interaction.) 40 tablet 3  . tacrolimus (PROGRAF) 1 MG capsule Take 3 mg by mouth 2 (two) times daily. Reported on 08/16/2015    . tamsulosin (FLOMAX) 0.4 MG CAPS capsule Take 1 capsule (0.4 mg total) by mouth daily. 90 capsule 3   No current facility-administered medications for this visit.    Facility-Administered Medications Ordered in Other Visits  Medication Dose Route Frequency Provider Last Rate Last Dose  . heparin lock flush 100 unit/mL  500 Units Intravenous Once  Corcoran, Melissa C, MD      . heparin lock flush 100 unit/mL  500 Units Intravenous Once Corcoran, Melissa C, MD      . heparin lock flush 100 unit/mL  500 Units Intravenous Once Lequita Asal, MD      .  sodium chloride flush (NS) 0.9 % injection 10 mL  10 mL Intracatheter PRN Corcoran, Melissa C, MD      . sodium chloride flush (NS) 0.9 % injection 10 mL  10 mL Intravenous PRN Corcoran, Melissa C, MD      . sodium chloride flush (NS) 0.9 % injection 10 mL  10 mL Intravenous Once Lequita Asal, MD        Review of Systems Review of Systems  Constitutional: Negative.   Respiratory: Negative.   Cardiovascular: Negative.     Blood pressure 128/72, pulse 77, resp. rate 16, height 5\' 8"  (1.727 m), weight 122 lb (55.3 kg).  Physical Exam Physical Exam  Constitutional: He is oriented to person, place, and time. He appears well-developed and well-nourished.  HENT:  Mouth/Throat: Oropharynx is clear and moist.  Eyes: Conjunctivae are normal. No scleral icterus.  Neck: Neck supple.  Cardiovascular: Normal rate, regular rhythm and normal heart sounds.  Pulmonary/Chest: Effort normal and breath sounds normal.  Abdominal: Soft. Normal appearance. There is no tenderness. A hernia is present. Hernia confirmed positive in the left inguinal area.  Neurological: He is alert and oriented to person, place, and time.  Skin: Skin is warm and dry.  Psychiatric: His behavior is normal.  Left inguinal hernia is reducible.  Data Reviewed CT reviewed from 03-08-17 Progress notes and recent labs  Assessment    Left inguinal hernia-symptomatic. This can be repaired under sedation and local but his WBC is 2.2K.      Plan    Discussed with Dr Mike Gip.  Patient is due to obtain a consultation from [facility] oncology.  Will make a decision about the hernia after this consultation.       HPI, Physical Exam, Assessment and Plan have been scribed under the direction and in the presence of Mckinley Jewel, MD Karie Fetch, RN I have completed the exam and reviewed the above documentation for accuracy and completeness.  I agree with the above.  Haematologist has been used and any errors in dictation or transcription are unintentional.  Seeplaputhur G. Jamal Collin, M.D., F.A.C.S.   Junie Panning G 06/08/2017, 9:05 AM

## 2017-06-05 MED ORDER — PREDNISONE 5 MG TABLET
ORAL_TABLET | Freq: Every day | ORAL | 3 refills | 0.00000 days | Status: CP
Start: 2017-06-05 — End: ?

## 2017-06-05 NOTE — Unmapped (Signed)
Patient request for RX refill.

## 2017-06-08 ENCOUNTER — Encounter: Payer: Self-pay | Admitting: Hematology and Oncology

## 2017-06-09 ENCOUNTER — Telehealth: Payer: Self-pay | Admitting: *Deleted

## 2017-06-09 ENCOUNTER — Other Ambulatory Visit: Payer: Self-pay

## 2017-06-09 ENCOUNTER — Encounter
Admission: RE | Admit: 2017-06-09 | Discharge: 2017-06-09 | Disposition: A | Discharge status: Home / Self Care | Payer: Medicare HMO | Source: Ambulatory Visit | Attending: Vascular Surgery | Admitting: Vascular Surgery

## 2017-06-09 DIAGNOSIS — I739 Peripheral vascular disease, unspecified: Secondary | ICD-10-CM | POA: Diagnosis not present

## 2017-06-09 DIAGNOSIS — Z79899 Other long term (current) drug therapy: Secondary | ICD-10-CM | POA: Diagnosis not present

## 2017-06-09 DIAGNOSIS — I10 Essential (primary) hypertension: Secondary | ICD-10-CM | POA: Diagnosis not present

## 2017-06-09 DIAGNOSIS — Z96659 Presence of unspecified artificial knee joint: Secondary | ICD-10-CM | POA: Diagnosis not present

## 2017-06-09 DIAGNOSIS — Z87448 Personal history of other diseases of urinary system: Secondary | ICD-10-CM | POA: Diagnosis not present

## 2017-06-09 DIAGNOSIS — Z87891 Personal history of nicotine dependence: Secondary | ICD-10-CM | POA: Diagnosis not present

## 2017-06-09 DIAGNOSIS — Z940 Kidney transplant status: Secondary | ICD-10-CM | POA: Diagnosis not present

## 2017-06-09 DIAGNOSIS — T82868A Thrombosis of vascular prosthetic devices, implants and grafts, initial encounter: Secondary | ICD-10-CM | POA: Diagnosis present

## 2017-06-09 DIAGNOSIS — M109 Gout, unspecified: Secondary | ICD-10-CM | POA: Diagnosis not present

## 2017-06-09 DIAGNOSIS — K219 Gastro-esophageal reflux disease without esophagitis: Secondary | ICD-10-CM | POA: Diagnosis not present

## 2017-06-09 DIAGNOSIS — Z7952 Long term (current) use of systemic steroids: Secondary | ICD-10-CM | POA: Diagnosis not present

## 2017-06-09 DIAGNOSIS — T82848A Pain from vascular prosthetic devices, implants and grafts, initial encounter: Secondary | ICD-10-CM | POA: Diagnosis not present

## 2017-06-09 DIAGNOSIS — Z79818 Long term (current) use of other agents affecting estrogen receptors and estrogen levels: Secondary | ICD-10-CM | POA: Diagnosis not present

## 2017-06-09 DIAGNOSIS — Y832 Surgical operation with anastomosis, bypass or graft as the cause of abnormal reaction of the patient, or of later complication, without mention of misadventure at the time of the procedure: Secondary | ICD-10-CM | POA: Diagnosis not present

## 2017-06-09 HISTORY — DX: Cardiac arrhythmia, unspecified: I49.9

## 2017-06-09 HISTORY — DX: Pneumonia, unspecified organism: J18.9

## 2017-06-09 LAB — CBC WITH DIFFERENTIAL/PLATELET
BASOS ABS: 0 10*3/uL (ref 0–0.1)
BLASTS: 0 %
Band Neutrophils: 0 %
Basophils Relative: 0 %
Eosinophils Absolute: 0.1 10*3/uL (ref 0–0.7)
Eosinophils Relative: 3 %
HEMATOCRIT: 28.2 % — AB (ref 40.0–52.0)
HEMOGLOBIN: 9.5 g/dL — AB (ref 13.0–18.0)
Lymphocytes Relative: 10 %
Lymphs Abs: 0.2 10*3/uL — ABNORMAL LOW (ref 1.0–3.6)
MCH: 28.7 pg (ref 26.0–34.0)
MCHC: 33.5 g/dL (ref 32.0–36.0)
MCV: 85.6 fL (ref 80.0–100.0)
METAMYELOCYTES PCT: 0 %
MYELOCYTES: 0 %
Monocytes Absolute: 0.2 10*3/uL (ref 0.2–1.0)
Monocytes Relative: 13 %
NEUTROS PCT: 74 %
NRBC: 0 /100{WBCs}
Neutro Abs: 1.3 10*3/uL — ABNORMAL LOW (ref 1.4–6.5)
Other: 0 %
PROMYELOCYTES ABS: 0 %
Platelets: 105 10*3/uL — ABNORMAL LOW (ref 150–440)
RBC: 3.3 MIL/uL — AB (ref 4.40–5.90)
RDW: 17.6 % — ABNORMAL HIGH (ref 11.5–14.5)
WBC: 1.8 10*3/uL — AB (ref 3.8–10.6)

## 2017-06-09 LAB — BASIC METABOLIC PANEL
Anion gap: 7 (ref 5–15)
BUN: 22 mg/dL — AB (ref 6–20)
CO2: 26 mmol/L (ref 22–32)
CREATININE: 1.28 mg/dL — AB (ref 0.61–1.24)
Calcium: 10.2 mg/dL (ref 8.9–10.3)
Chloride: 99 mmol/L — ABNORMAL LOW (ref 101–111)
GFR, EST NON AFRICAN AMERICAN: 52 mL/min — AB (ref >60)
Glucose, Bld: 95 mg/dL (ref 65–99)
Potassium: 4.6 mmol/L (ref 3.5–5.1)
SODIUM: 132 mmol/L — AB (ref 135–145)

## 2017-06-09 LAB — PROTIME-INR
INR: 1.2
PROTHROMBIN TIME: 15.1 s (ref 11.4–15.2)

## 2017-06-09 LAB — SURGICAL PCR SCREEN
MRSA, PCR: NEGATIVE
STAPHYLOCOCCUS AUREUS: NEGATIVE

## 2017-06-09 LAB — TYPE AND SCREEN
ABO/RH(D): O POS
ANTIBODY SCREEN: NEGATIVE

## 2017-06-09 LAB — PATHOLOGIST SMEAR REVIEW

## 2017-06-09 LAB — APTT: APTT: 35 s (ref 24–36)

## 2017-06-09 MED ORDER — CEFAZOLIN SODIUM-DEXTROSE 2-4 GM/100ML-% IV SOLN
2.0000 g | INTRAVENOUS | Status: AC
  Administered 2017-06-10: 2 g via INTRAVENOUS

## 2017-06-09 MED ORDER — CHLORHEXIDINE GLUCONATE CLOTH 2 % EX PADS
6.0000 | MEDICATED_PAD | Freq: Once | CUTANEOUS | Status: DC
  Filled 2017-06-09: qty 6

## 2017-06-09 NOTE — Pre-Procedure Instructions (Addendum)
EKG with undetermined rhythm.  Dr Ronelle Nigh in department. Notified regarding EKG. "OK for surgery, likely in artial fibulation with slow rate." per Dr Ronelle Nigh.

## 2017-06-09 NOTE — Telephone Encounter (Signed)
Do you have any updates from Dr. Lonia Blood? I don't know what to tell them. Should they just call and make an appointment? He is really wanting to get this hernia repaired.

## 2017-06-09 NOTE — Telephone Encounter (Signed)
Called patient back and spoke to significant other.  Per Dr. Mike Gip, she has been texting Dr. Lonia Blood.  His office should contact patient directly with appointment.  She recommends postponing hernia surgery until he sees Dr. Lonia Blood.  Verbalized understanding.

## 2017-06-09 NOTE — Telephone Encounter (Signed)
-----   Message from Karen Kitchens, NP sent at 06/09/2017  4:24 AM EST ----- Regarding: RE: Return call to Patient's wife regarding plan of care Dr. Mike Gip was waiting to hear from Dr. Lonia Blood. Do you know if he ever got in down there? Have them call Dittus' office and ask about his plans for seeing them. He is wanting to have his hernia repaired, however his neutropenia is presenting it.   Gaspar Bidding ----- Message ----- From: Shawnee Knapp, RN Sent: 06/08/2017   4:42 PM To: Karen Kitchens, NP, Shirlean Kelly, RN Subject: Return call to Patient's wife regarding plan#

## 2017-06-09 NOTE — Pre-Procedure Instructions (Signed)
Subjective:   Matthew Brown is a 76 y.o. male with a history of ESRD s/p transplant in 2006, stage 4 B cell lymphoma with bone metastases (last chemotherapy was in April/May per patient; patient says he was told he still has mets to the bone and liver), HTN, gout, h/o pneumonia, h/o pancreatitis, atrial fibrillation, multiple GI bleeds who is being seen today in new consultation at the request of Dr. Kellie Shropshire True MD for evaluation of atrial fibrillation. Matthew Brown is accompanied by his fiance to clinic. He has had multiple GI bleeding episodes (in 2017, his fiance says his "blood vessel in his stomach burst."). In 06/2016, he was admitted for upper GI bleed and received 2u PRBC. EGD showed nonbleeding duodenal ulcers. Hemoglobin dropped to 5, he had melanotic stools, and he was hypotensive. He was transferred to ICU at that time and tagged RBC scan showed possible small bowel bleed. Colonoscopy showed nonbleeding diverticula. His anticoagulation for afib was stopped during this time (06/2016). Subsequently, he presented on 12/03/16 with right-sided facial numbness and right arm pain radiating from his shoulder X 2 days. CT scan of the head showed: "Region of encephalomalacia in the left occipital lobe and hypoattenuation in the left posterior limb of the internal capsule, likely secondary to remote ischemia in the distribution of the left posterior cerebral artery." Symptoms of right facial numbness and right arm pain had resolved and he was discharged from the ED. Patient presents for second opinion of possible watchman implantation. ECG today: afib at 53bpm, LAFB. Echocardiogram: 06/2015: DU>20%, LA systolic diameter: 2.5KY Patient reports feeling fatigued in afib. He experiences intermittent palpitations which last for a seconds or less.  1. Persistent atrial fibrillation > Patient has h/o multiple GI bleeds (most recently in 06/2016 for which his hgn dropped to 5 per discharge summary, developed hypotension and  required PRBC transfusion). Anticoagulation therefore has been discontinued.  > He has history of metastatic diffuse large B-cell lymphoma with continued mets to the bone and liver even after recent chemotherapy. Unclear if recent symptoms for which he presented to the ER on 11/2016 (right facial numbness and right arm pain) were due to embolic etiology (no clear evidence of embolic stroke or TIA on CT scan - only small vessel disease). Will discuss with patient's oncologist regarding his prognosis from a lymphoma standpoint. After discussion with patient's oncologist, I will discuss with my colleagues who perform watchman implantation to see if Matthew Brown may be a candidate for watchman. I did notify the patient that he would still need to be on aspirin +plavix for some time after his watchman implantation.   Valorie Roosevelt MD MAS Assistant Professor Cardiac Electrophysiology  Subjective:   Matthew Brown is a 76 y.o. male with a history of ESRD s/p transplant in 2006, stage 4 B cell lymphoma with bone metastases (last chemotherapy was in April/May per patient; patient says he was told he still has mets to the bone and liver), HTN, gout, h/o pneumonia, h/o pancreatitis, atrial fibrillation, multiple GI bleeds who is being seen today in new consultation at the request of Dr. Kellie Shropshire True MD for evaluation of atrial fibrillation. Matthew Brown is accompanied by his fiance to clinic. He has had multiple GI bleeding episodes (in 2017, his fiance says his "blood vessel in his stomach burst."). In 06/2016, he was admitted for upper GI bleed and received 2u PRBC. EGD showed nonbleeding duodenal ulcers. Hemoglobin dropped to 5, he had melanotic stools, and he was hypotensive. He was  transferred to ICU at that time and tagged RBC scan showed possible small bowel bleed. Colonoscopy showed nonbleeding diverticula. His anticoagulation for afib was stopped during this time (06/2016). Subsequently, he presented on 12/03/16 with  right-sided facial numbness and right arm pain radiating from his shoulder X 2 days. CT scan of the head showed: "Region of encephalomalacia in the left occipital lobe and hypoattenuation in the left posterior limb of the internal capsule, likely secondary to remote ischemia in the distribution of the left posterior cerebral artery." Symptoms of right facial numbness and right arm pain had resolved and he was discharged from the ED. Patient presents for second opinion of possible watchman implantation. ECG today: afib at 53bpm, LAFB. Echocardiogram: 06/2015: ZO>10%, LA systolic diameter: 9.6EA Patient reports feeling fatigued in afib. He experiences intermittent palpitations which last for a seconds or less.  Past Medical History:  Diagnosis Date  . Arthritis  . Chronic kidney disease  . Coronary artery disease  . Gout  . History of transfusion  . Hypertension  . Kidney transplant status, cadaveric 2006  . Peptic ulceration   Past Surgical History:  Procedure Laterality Date  . BACK SURGERY  . JOINT REPLACEMENT  . KNEE SURGERY  . NEPHRECTOMY TRANSPLANTED ORGAN  . PR COLONOSCOPY FLX DX W/COLLJ SPEC WHEN PFRMD N/A 07/15/2016  Procedure: COLONOSCOPY, FLEXIBLE, PROXIMAL TO SPLENIC FLEXURE; DIAGNOSTIC, W/WO COLLECTION SPECIMEN BY BRUSH OR [place]; Surgeon: Traci Sermon, MD; [facility]; Service: Gastroenterology  . PR COLSC FLX W/RMVL OF TUMOR POLYP LESION SNARE TQ Left 03/17/2013  Procedure: COLONOSCOPY FLEX; W/REMOV TUMOR/LES BY SNARE; Surgeon: Brain Hilts, MD; Location: [facility]; Service: Gastroenterology  . PR SMALL BOWEL ENDOSCOPY,BIOPSY N/A 07/15/2016  Procedure: SM INTESTINAL ENDO NOT ILEUM; W/BX 1/MX; Surgeon: Traci Sermon, MD; Location: [facility]; Service: Gastroenterology  . PR THORACENTESIS NEEDLE/CATH PLEURA W/IMAGING N/A 07/20/2015  Procedure: THORACENTESIS W/ IMAGING; Surgeon: Garry Heater, MD;  Location: [place] PROCEDURE [facility]; Service: Pulmonary  . PR UP GI ENDOSCOPY,REMV TUMOR,SNARE N/A 11/23/2015  Procedure: UGI ENDO; W/REMOV TUMOR/POLYP/OTHER LES-SNARE; Surgeon: Varney Baas, MD; Location: [facility]; Service: Gastroenterology  . PR UPPER GI ENDOSCOPY,BIOPSY N/A 03/13/2015  Procedure: UGI ENDOSCOPY; WITH BIOPSY, SINGLE OR MULTIPLE; Surgeon: Forestine Chute, MD; Location: [facility]; Service: Gastroenterology  . PR UPPER GI ENDOSCOPY,BIOPSY N/A 12/26/2015  Procedure: UGI ENDOSCOPY; WITH BIOPSY, SINGLE OR MULTIPLE; Surgeon: Saunders Revel, MD; Location: [facility]; Service: Gastroenterology  . PR UPPER GI ENDOSCOPY,DIAGNOSIS N/A 03/17/2013  Procedure: UGI ENDO, INCLUDE ESOPHAGUS, STOMACH, & DUODENUM &/OR JEJUNUM; DX W/WO COLLECTION SPECIMN, BY BRUSH OR [place]; Surgeon: Brain Hilts, MD; Location: [facility]; Service: Gastroenterology   Current Outpatient Prescriptions on File Prior to Visit  Medication Sig Dispense Refill  . albuterol (PROAIR HFA) 90 mcg/actuation inhaler Inhale 2 puffs every four (4) hours as needed. 1 Inhaler 11  . allopurinol (ZYLOPRIM) 100 MG tablet TAKE 1 TABLET (100 MG TOTAL) BY MOUTH DAILY. 90 tablet 3  . carboxymethylcellulose sodium (THERATEARS) 0.25 % Drop Administer 2 drops to both eyes 4 (four) times a day as needed (Dry eyes). 30 mL 2  . finasteride (PROSCAR) 5 mg tablet TAKE 1 TABLET (5 MG TOTAL) BY MOUTH DAILY. 90 tablet 3  . furosemide (LASIX) 20 MG tablet 1-2 tablets daily as needed 60 tablet 11  . hydroxypropyl methylcellulose (ISOPTO TEARS) 2.5 % ophthalmic solution 1 drop as needed.  . metoprolol  tartrate (LOPRESSOR) 25 MG tablet Take 0.5 tablets (12.5 mg total) by mouth Two (2) times a day. 30 tablet 11  . multivitamin (MULTIVITAMIN) per tablet Take 1 tablet by mouth daily.  [name] oxybutynin (DITROPAN-XL) 5 MG 24 hr tablet TAKE 1 TABLET (5 MG TOTAL) BY MOUTH ONCE DAILY. 90  tablet 3  . predniSONE (DELTASONE) 5 MG tablet Take 1 tablet (5 mg total) by mouth daily. 90 tablet 4  . tacrolimus (PROGRAF) 1 MG capsule Take 3 capsules (3mg ) in the morning and 3 capsules (3mg ) at night. Z94.0 180 capsule 11  . tamsulosin (FLOMAX) 0.4 mg capsule TAKE 1 CAPSULE (0.4 MG TOTAL) BY MOUTH TWO (2) TIMES A DAY. 180 capsule 3   No current facility-administered medications on file prior to visit.   No Known Allergies  Social History: Social History   Social History  . Marital status: Single  Spouse name: N/A  . Number of children: N/A  . Years of education: N/A   Social History Main Topics  . Smoking status: Former Smoker  Quit date: 03/17/1980  . Smokeless tobacco: Never Used  . Alcohol use No  . Drug use: No  . Sexual activity: Not Asked   Other Topics Concern  . None   Social History Narrative  . None   Denies tobacco, significant alcohol, or drug use  Family History: Family History  Problem Relation Age of Onset  . Cancer Mother  . Anesthesia problems Neg Hx   Denies family history of afib, sudden cardiac death, pacemaker or ICD implantation.   ROS:  The balance of 10/12 systems are negative aside from that stated in the HPI    Objective:   Physical Exam (reviewed and updated) Vitals:  12/19/16 1024  BP: 115/40  Pulse: 53  SpO2: 98%   General: frail elderly gentleman sitting hunched over in the chair Eyes: EOMI, anicteric ENT: OP clear with MMM Neck: Supple without LAD, no JVD, no carotid bruits. Resp: CTA bilaterally with normal WOB. CV: RRR, no audible murmurs. Abd: Soft, NT/ND without HSM. Ext: 2+ radial and DP pulses bilaterally. No LE edema. Neuro: A&O, grossly non-focal Psych: Appropriate affect  Cardiographics ECG (independently reviewed): afib at 53bpm, LAFB.  Echocardiogram: 06/2015: JM>42%, LA systolic diameter: 6.8TM  Lab Review  Lab Results  Component Value Date  WBC 3.1 (L) 12/03/2016  HGB 8.9 (L) 12/03/2016  HCT  27.8 (L) 12/03/2016  PLT 212 12/03/2016   Lab Results  Component Value Date  NA 135 12/03/2016  K 5.2 (H) 12/03/2016  CL 101 12/03/2016  CO2 25.0 12/03/2016  BUN 22 (H) 12/03/2016  CREATININE 1.14 12/03/2016  GLU 125 12/03/2016  CALCIUM 10.3 (H) 12/03/2016  MG 1.8 11/04/2016  PHOS 2.7 11/04/2016   Lab Results  Component Value Date  BILITOT 0.6 12/03/2016  BILIDIR 0.38 03/07/2016  PROT 5.7 (L) 12/03/2016  ALBUMIN 3.2 (L) 12/03/2016  ALT 35 12/03/2016  AST 32 12/03/2016  ALKPHOS 140 (H) 12/03/2016  GGT 59 11/04/2016   Lab Results  Component Value Date  LABPROT 12.0 09/13/2014  INR 1.39 12/03/2016  APTT 32.8 12/03/2016   None

## 2017-06-09 NOTE — Telephone Encounter (Signed)
Called and spoke to patient's significant other.  She states patient never got an appointment with Dr. Lonia Blood.

## 2017-06-09 NOTE — Telephone Encounter (Signed)
  I have been texting Dittus.  I think his office is going to contact him directly for an appointment.  I would postpone surgery right now as long as his hernia is reducible and he has seen Dittus.  M

## 2017-06-09 NOTE — Patient Instructions (Addendum)
Your procedure is scheduled on: 06/10/17 11:30 am Report to Same Day Surgery 2nd floor medical mall    Remember: Instructions that are not followed completely may result in serious medical risk, up to and including death, or upon the discretion of your surgeon and anesthesiologist your surgery may need to be rescheduled.    _x___ 1. Do not eat food after midnight the night before your procedure. You may drink clear liquids up to 2 hours before you are scheduled to arrive at the hospital for your procedure.  Do not drink clear liquids within 2 hours of your scheduled arrival to the hospital.  Clear liquids include  --Water or Apple juice without pulp  --Clear carbohydrate beverage such as ClearFast or Gatorade  --Black Coffee or Clear Tea (No milk, no creamers, do not add anything to                  the coffee or Tea Type 1 and type 2 diabetics should only drink water.  No gum chewing or hard candies.     __x__ 2. No Alcohol for 24 hours before or after surgery.   __x__3. No Smoking for 24 prior to surgery.   ____  4. Bring all medications with you on the day of surgery if instructed.    __x__ 5. Notify your doctor if there is any change in your medical condition     (cold, fever, infections).     Do not wear jewelry, make-up, hairpins, clips or nail polish.  Do not wear lotions, powders, or perfumes. You may wear deodorant.  Do not shave 48 hours prior to surgery. Men may shave face and neck.  Do not bring valuables to the hospital.    [facility] is not responsible for any belongings or valuables.               Contacts, dentures or bridgework may not be worn into surgery.  Leave your suitcase in the car. After surgery it may be brought to your room.  For patients admitted to the hospital, discharge time is determined by your                       treatment team.   Patients discharged the day of surgery will not be allowed to drive home.  You will need someone to drive you home  and stay with you the night of your procedure.    Please read over the following fact sheets that you were given:   [facility] Preparing for Surgery and or MRSA Information   _x___ Take anti-hypertensive listed below, cardiac, seizure, asthma,     anti-reflux and psychiatric medicines. These include:  1. albuterol (PROAIR HFA) 108 (90 BASE) MCG/ACT inhaler  2.finasteride (PROSCAR) 5 MG tablet  3.metoprolol tartrate (LOPRESSOR) 25 MG tablet  4.pantoprazole (PROTONIX) 40 MG tablet  5.predniSONE (DELTASONE) 5 MG tablet  6.tacrolimus (PROGRAF) 1 MG capsule  7.tamsulosin (FLOMAX) 0.4 MG CAPS capsule ____Fleets enema or Magnesium Citrate as directed.   _x___ Use CHG Soap or sage wipes as directed on instruction sheet   _x___ Use inhalers on the day of surgery and bring to hospital day of surgery  ____ Stop Metformin and Janumet 2 days prior to surgery.    ____ Take 1/2 of usual insulin dose the night before surgery and none on the morning     surgery.   _x___ Follow recommendations from Cardiologist, Pulmonologist or PCP regarding  stopping Aspirin, Coumadin, Plavix ,Eliquis, Effient, or Pradaxa, and Pletal.  X____Stop Anti-inflammatories such as Advil, Aleve, Ibuprofen, Motrin, Naproxen, Naprosyn, Goodies powders or aspirin products. OK to take Tylenol and                          Celebrex.   _x___ Stop supplements until after surgery.  But may continue Vitamin D, Vitamin B,       and multivitamin.   ____ Bring C-Pap to the hospital.

## 2017-06-09 NOTE — Unmapped (Signed)
[facility] Refill Coordination Note  Specialty Medication(s): Prograf 1mg     Taylor Ramos, DOB: 26-Jul-1940  Phone: [phone] (home) , Alternate phone contact: N/A  Phone or address changes today?: No  All above HIPAA information was verified with patient.  Shipping Address: [address]  [place] [address]   Insurance changes? No    Completed refill call assessment today to schedule patient's medication shipment from the [facility] [phone]).      Confirmed the medication and dosage are correct and have not changed: Yes, regimen is correct and unchanged.    Confirmed patient started or stopped the following medications in the past month:  No, there are no changes reported at this time.    Are you tolerating your medication?:  Taylor Ramos reports tolerating the medication.    ADHERENCE    (Below is required for Medicare Part B or Transplant patients only - per drug):   How many tablets were dispensed last month:     Prograf 1 mg   Quantity filled last month: 180   # of tablets left on hand: 10 days    Did you miss any doses in the past 4 weeks? No missed doses reported.    FINANCIAL/SHIPPING    Delivery Scheduled: Yes, Expected medication delivery date: 06/12/2017     Taylor Ramos did not have any additional questions at this time.    Delivery address validated in [facility] scheduling system: Yes, address listed in [facility] is correct.    We will follow up with patient monthly for standard refill processing and delivery.      Thank you,  Tamala Fothergill   [facility] Specialty Technician

## 2017-06-10 ENCOUNTER — Ambulatory Visit: Payer: Medicare HMO | Admitting: Anesthesiology

## 2017-06-10 ENCOUNTER — Other Ambulatory Visit: Payer: Self-pay | Admitting: *Deleted

## 2017-06-10 ENCOUNTER — Ambulatory Visit
Admission: RE | Admit: 2017-06-10 | Discharge: 2017-06-10 | Disposition: A | Discharge status: Home / Self Care | Payer: Medicare HMO | Source: Ambulatory Visit | Attending: Vascular Surgery | Admitting: Vascular Surgery

## 2017-06-10 ENCOUNTER — Encounter
Admission: RE | Disposition: A | Discharge status: Home / Self Care | Payer: Self-pay | Source: Ambulatory Visit | Attending: Vascular Surgery

## 2017-06-10 ENCOUNTER — Encounter: Payer: Self-pay | Admitting: *Deleted

## 2017-06-10 DIAGNOSIS — T82848A Pain from vascular prosthetic devices, implants and grafts, initial encounter: Secondary | ICD-10-CM | POA: Insufficient documentation

## 2017-06-10 DIAGNOSIS — Z7952 Long term (current) use of systemic steroids: Secondary | ICD-10-CM | POA: Insufficient documentation

## 2017-06-10 DIAGNOSIS — T82868A Thrombosis of vascular prosthetic devices, implants and grafts, initial encounter: Secondary | ICD-10-CM | POA: Insufficient documentation

## 2017-06-10 DIAGNOSIS — Z87891 Personal history of nicotine dependence: Secondary | ICD-10-CM | POA: Insufficient documentation

## 2017-06-10 DIAGNOSIS — I739 Peripheral vascular disease, unspecified: Secondary | ICD-10-CM | POA: Insufficient documentation

## 2017-06-10 DIAGNOSIS — N186 End stage renal disease: Secondary | ICD-10-CM | POA: Diagnosis not present

## 2017-06-10 DIAGNOSIS — Y832 Surgical operation with anastomosis, bypass or graft as the cause of abnormal reaction of the patient, or of later complication, without mention of misadventure at the time of the procedure: Secondary | ICD-10-CM | POA: Insufficient documentation

## 2017-06-10 DIAGNOSIS — Z940 Kidney transplant status: Secondary | ICD-10-CM | POA: Insufficient documentation

## 2017-06-10 DIAGNOSIS — Z79818 Long term (current) use of other agents affecting estrogen receptors and estrogen levels: Secondary | ICD-10-CM | POA: Insufficient documentation

## 2017-06-10 DIAGNOSIS — I10 Essential (primary) hypertension: Secondary | ICD-10-CM | POA: Insufficient documentation

## 2017-06-10 DIAGNOSIS — M109 Gout, unspecified: Secondary | ICD-10-CM | POA: Insufficient documentation

## 2017-06-10 DIAGNOSIS — Z96659 Presence of unspecified artificial knee joint: Secondary | ICD-10-CM | POA: Insufficient documentation

## 2017-06-10 DIAGNOSIS — Z79899 Other long term (current) drug therapy: Secondary | ICD-10-CM | POA: Insufficient documentation

## 2017-06-10 DIAGNOSIS — Z87448 Personal history of other diseases of urinary system: Secondary | ICD-10-CM | POA: Insufficient documentation

## 2017-06-10 DIAGNOSIS — K219 Gastro-esophageal reflux disease without esophagitis: Secondary | ICD-10-CM | POA: Insufficient documentation

## 2017-06-10 HISTORY — PX: LIGATION OF ARTERIOVENOUS  FISTULA: SHX5948

## 2017-06-10 SURGERY — LIGATION OF ARTERIOVENOUS  FISTULA
Anesthesia: General | Laterality: Right | Wound class: Clean

## 2017-06-10 MED ORDER — LIDOCAINE HCL (CARDIAC) 20 MG/ML IV SOLN
INTRAVENOUS | Status: DC | PRN
  Administered 2017-06-10: 50 mg via INTRAVENOUS

## 2017-06-10 MED ORDER — FENTANYL CITRATE (PF) 100 MCG/2ML IJ SOLN
INTRAMUSCULAR | Status: AC
  Filled 2017-06-10: qty 2

## 2017-06-10 MED ORDER — EPHEDRINE SULFATE 50 MG/ML IJ SOLN
INTRAMUSCULAR | Status: DC | PRN
  Administered 2017-06-10: 5 mg via INTRAVENOUS

## 2017-06-10 MED ORDER — PROPOFOL 10 MG/ML IV BOLUS
INTRAVENOUS | Status: DC | PRN
  Administered 2017-06-10: 20 mg via INTRAVENOUS

## 2017-06-10 MED ORDER — ONDANSETRON HCL 4 MG/2ML IJ SOLN
INTRAMUSCULAR | Status: DC | PRN
  Administered 2017-06-10: 4 mg via INTRAVENOUS

## 2017-06-10 MED ORDER — HYDROCODONE-ACETAMINOPHEN 5-325 MG PO TABS: 1.0000 | ORAL_TABLET | Freq: Four times a day (QID) | ORAL | 0 refills | Status: DC | PRN

## 2017-06-10 MED ORDER — FENTANYL CITRATE (PF) 100 MCG/2ML IJ SOLN
INTRAMUSCULAR | Status: DC | PRN
  Administered 2017-06-10: 25 ug via INTRAVENOUS
  Administered 2017-06-10: 50 ug via INTRAVENOUS
  Administered 2017-06-10: 25 ug via INTRAVENOUS

## 2017-06-10 MED ORDER — CEFAZOLIN SODIUM-DEXTROSE 2-4 GM/100ML-% IV SOLN
INTRAVENOUS | Status: AC
  Filled 2017-06-10: qty 100

## 2017-06-10 MED ORDER — FENTANYL CITRATE (PF) 100 MCG/2ML IJ SOLN: 25.0000 ug | INTRAMUSCULAR | Status: DC | PRN

## 2017-06-10 MED ORDER — LACTATED RINGERS IV SOLN
INTRAVENOUS | Status: DC
  Administered 2017-06-10: 12:00:00 via INTRAVENOUS

## 2017-06-10 MED ORDER — ETOMIDATE 2 MG/ML IV SOLN
INTRAVENOUS | Status: DC | PRN
  Administered 2017-06-10: 14 mg via INTRAVENOUS

## 2017-06-10 MED ORDER — BUPIVACAINE-EPINEPHRINE (PF) 0.5% -1:200000 IJ SOLN
INTRAMUSCULAR | Status: AC
  Filled 2017-06-10: qty 30

## 2017-06-10 MED ORDER — ETOMIDATE 2 MG/ML IV SOLN
INTRAVENOUS | Status: AC
  Filled 2017-06-10: qty 10

## 2017-06-10 MED ORDER — SODIUM CHLORIDE 0.9 % IV SOLN
INTRAVENOUS | Status: DC | PRN
  Administered 2017-06-10: 13:00:00 via INTRAVENOUS

## 2017-06-10 MED ORDER — HEPARIN SODIUM (PORCINE) 5000 UNIT/ML IJ SOLN
INTRAMUSCULAR | Status: AC
  Filled 2017-06-10: qty 1

## 2017-06-10 MED ORDER — SUCCINYLCHOLINE CHLORIDE 20 MG/ML IJ SOLN
INTRAMUSCULAR | Status: DC | PRN
  Administered 2017-06-10: 50 mg via INTRAVENOUS

## 2017-06-10 MED ORDER — ONDANSETRON HCL 4 MG/2ML IJ SOLN: 4.0000 mg | Freq: Once | INTRAMUSCULAR | Status: DC | PRN

## 2017-06-10 MED ORDER — ONDANSETRON HCL 4 MG/2ML IJ SOLN
INTRAMUSCULAR | Status: AC
  Filled 2017-06-10: qty 2

## 2017-06-10 MED ORDER — LIDOCAINE HCL (PF) 2 % IJ SOLN
INTRAMUSCULAR | Status: AC
  Filled 2017-06-10: qty 10

## 2017-06-10 SURGICAL SUPPLY — 47 items
ADH SKN CLS APL DERMABOND .7 (GAUZE/BANDAGES/DRESSINGS) ×1
BAG DECANTER FOR FLEXI CONT (MISCELLANEOUS) ×2 IMPLANT
BLADE SURG SZ11 CARB STEEL (BLADE) ×2 IMPLANT
BOOT SUTURE AID YELLOW STND (SUTURE) ×2 IMPLANT
CANISTER SUCT 1200ML W/VALVE (MISCELLANEOUS) ×2 IMPLANT
CHLORAPREP W/TINT 26ML (MISCELLANEOUS) ×2 IMPLANT
CLIP SPRNG 6MM S-JAW DBL (CLIP) ×2
DERMABOND ADVANCED (GAUZE/BANDAGES/DRESSINGS) ×1
DERMABOND ADVANCED .7 DNX12 (GAUZE/BANDAGES/DRESSINGS) ×1 IMPLANT
ELECT CAUTERY BLADE 6.4 (BLADE) ×2 IMPLANT
ELECT REM PT RETURN 9FT ADLT (ELECTROSURGICAL) ×2
ELECTRODE REM PT RTRN 9FT ADLT (ELECTROSURGICAL) ×1 IMPLANT
GLOVE BIO SURGEON STRL SZ7 (GLOVE) ×4 IMPLANT
GLOVE INDICATOR 7.5 STRL GRN (GLOVE) ×2 IMPLANT
GOWN STRL REUS W/ TWL LRG LVL3 (GOWN DISPOSABLE) ×2 IMPLANT
GOWN STRL REUS W/ TWL XL LVL3 (GOWN DISPOSABLE) ×1 IMPLANT
GOWN STRL REUS W/TWL LRG LVL3 (GOWN DISPOSABLE) ×4
GOWN STRL REUS W/TWL XL LVL3 (GOWN DISPOSABLE) ×2
HEMOSTAT SURGICEL 2X3 (HEMOSTASIS) ×2 IMPLANT
IV NS 500ML (IV SOLUTION) ×2
IV NS 500ML BAXH (IV SOLUTION) ×1 IMPLANT
LABEL OR SOLS (LABEL) ×2 IMPLANT
LOOP RED MAXI  1X406MM (MISCELLANEOUS) ×1
LOOP VESSEL MAXI 1X406 RED (MISCELLANEOUS) ×1 IMPLANT
LOOP VESSEL MINI 0.8X406 BLUE (MISCELLANEOUS) ×1 IMPLANT
LOOPS BLUE MINI 0.8X406MM (MISCELLANEOUS) ×1
NEEDLE FILTER BLUNT 18X 1/2SAF (NEEDLE) ×1
NEEDLE FILTER BLUNT 18X1 1/2 (NEEDLE) ×1 IMPLANT
NS IRRIG 500ML POUR BTL (IV SOLUTION) ×2 IMPLANT
PACK EXTREMITY ARMC (MISCELLANEOUS) ×2 IMPLANT
PAD PREP 24X41 OB/GYN DISP (PERSONAL CARE ITEMS) ×2 IMPLANT
SOLUTION CELL SAVER (CLIP) ×1 IMPLANT
STOCKINETTE STRL 4IN 9604848 (GAUZE/BANDAGES/DRESSINGS) ×2 IMPLANT
STRAP SAFETY BODY (MISCELLANEOUS) ×2 IMPLANT
SUT MNCRL AB 4-0 PS2 18 (SUTURE) ×2 IMPLANT
SUT PROLENE 6 0 BV (SUTURE) ×2 IMPLANT
SUT SILK 2 0 (SUTURE) ×2
SUT SILK 2 0 SH (SUTURE) ×2 IMPLANT
SUT SILK 2-0 18XBRD TIE 12 (SUTURE) ×1 IMPLANT
SUT SILK 3 0 (SUTURE) ×2
SUT SILK 3-0 18XBRD TIE 12 (SUTURE) ×1 IMPLANT
SUT SILK 4 0 (SUTURE) ×1
SUT SILK 4-0 18XBRD TIE 12 (SUTURE) ×1 IMPLANT
SUT VIC AB 3-0 SH 27 (SUTURE) ×2
SUT VIC AB 3-0 SH 27X BRD (SUTURE) ×1 IMPLANT
SYR 20CC LL (SYRINGE) ×2 IMPLANT
SYR 3ML LL SCALE MARK (SYRINGE) ×2 IMPLANT

## 2017-06-10 NOTE — Anesthesia Preprocedure Evaluation (Addendum)
Anesthesia Evaluation  Patient identified by MRN, date of birth, ID band Patient awake    Reviewed: Allergy & Precautions, H&P , NPO status , Patient's Chart, lab work & pertinent test results, reviewed documented beta blocker date and time   History of Anesthesia Complications Negative for: history of anesthetic complications  Airway Mallampati: III  TM Distance: >3 FB Neck ROM: limited    Dental  (+) Poor Dentition, Missing, Edentulous Upper, Edentulous Lower   Pulmonary neg shortness of breath, pneumonia, resolved, former smoker,    Pulmonary exam normal breath sounds clear to auscultation       Cardiovascular hypertension, (-) angina+ Peripheral Vascular Disease  (-) Past MI Normal cardiovascular exam+ dysrhythmias  Rhythm:regular Rate:Normal     Neuro/Psych  Headaches,  Neuromuscular disease negative psych ROS   GI/Hepatic Neg liver ROS, GERD  Controlled,  Endo/Other    Renal/GU Renal disease  negative genitourinary   Musculoskeletal  (+) Arthritis ,   Abdominal   Peds  Hematology negative hematology ROS (+) anemia ,   Anesthesia Other Findings Patient is NPO appropriate and reports no nausea or vomiting today.   Past Medical History: No date: Benign prostatic hypertrophy 10/19/2015: Chronic headache No date: ED (erectile dysfunction) No date: End stage renal disease (HCC) No date: Essential hypertension No date: GERD (gastroesophageal reflux disease) No date: GIB (gastrointestinal bleeding)     Comment: a. 01/9380 s/p R colic artery embolization;  b.              02/2015 EGD: duod ulcerative mass->Bx notable               for coagulative necrosis - ? ischemia vs               thrombosis-->coumadin d/c'd. No date: Gout No date: Hearing loss No date: Hemorrhoids No date: Hyperlipidemia No date: Lymphoma ([place]) 2017: Lymphoma ([place]) 06/06/2016: Multiple thyroid nodules     Comment: Noted on carotid US;  dedicated US to be               ordered by staff No date: Osteoarthrosis, unspecified whether generalize* No date: Persistent atrial fibrillation ([place])     Comment: a. CHA2DS2VASc = 3-->coumadin d/c'd 02/2015 2/2              recurrent GIB. No date: Prostatitis No date: Pulmonary hypertension     Comment: a. 10/2014 Echo: EF 60-65%, mild to mod MR,               mildly dil LA, nl RV, PASP 75mmHg. No date: Renal transplant recipient No date: Ulcers of both great toes [facility])  Past Surgical History: No date: BACK SURGERY 03/13/15: ESOPHAGOGASTRODUODENOSCOPY     Comment: severe esophagitis, ulcerated mass 1974: HERNIA REPAIR 05/07/2016: PERIPHERAL VASCULAR CATHETERIZATION N/A     Comment: Procedure: Porta Cath Insertion;  Surgeon:               Algernon Huxley, MD;  Location: [facility]               [facility];  Service: Cardiovascular;  Laterality:               N/A; No date: PROSTATE ABLATION No date: STOMACH SURGERY     Comment: blood vessel burst No date: THROAT SURGERY No date: TOTAL KNEE ARTHROPLASTY  BMI    Body Mass Index:  19.69 kg/m      Reproductive/Obstetrics negative OB ROS  Anesthesia Physical  Anesthesia Plan  ASA: IV  Anesthesia Plan: General   Post-op Pain Management:    Induction:   PONV Risk Score and Plan:   Airway Management Planned: Oral ETT  Additional Equipment:   Intra-op Plan:   Post-operative Plan: Extubation in OR  Informed Consent: I have reviewed the patients History and Physical, chart, labs and discussed the procedure including the risks, benefits and alternatives for the proposed anesthesia with the patient or authorized representative who has indicated his/her understanding and acceptance.   Dental Advisory Given  Plan Discussed with: Anesthesiologist, CRNA and Surgeon  Anesthesia Plan Comments:        Anesthesia Quick Evaluation

## 2017-06-10 NOTE — Transfer of Care (Signed)
Immediate Anesthesia Transfer of Care Note  Patient: Matthew Brown  Procedure(s) Performed: LIGATION OF ARTERIOVENOUS  FISTULA (Right )  Patient Location: PACU  Anesthesia Type:General  Level of Consciousness: sedated and responds to stimulation  Airway & Oxygen Therapy: Patient Spontanous Breathing and Patient connected to face mask oxygen  Post-op Assessment: Report given to RN and Post -op Vital signs reviewed and stable  Post vital signs: Reviewed and stable  Last Vitals:  Vitals:   06/10/17 1355 06/10/17 1356  BP:  (!) 159/73  Pulse:    Resp:  19  Temp: (P) 36.9 C   SpO2:  100%    Last Pain:  Vitals:   06/10/17 1152  TempSrc: Tympanic  PainSc: 10-Worst pain ever         Complications: No apparent anesthesia complications

## 2017-06-10 NOTE — Anesthesia Procedure Notes (Signed)
Procedure Name: Intubation Performed by: Darien Mignogna, CRNA Pre-anesthesia Checklist: Patient identified, Patient being monitored, Timeout performed, Emergency Drugs available and Suction available Patient Re-evaluated:Patient Re-evaluated prior to induction Oxygen Delivery Method: Circle system utilized Preoxygenation: Pre-oxygenation with 100% oxygen Induction Type: IV induction Ventilation: Mask ventilation without difficulty Laryngoscope Size: Mac and 3 Grade View: Grade I Tube type: Oral Tube size: 7.5 mm Number of attempts: 1 Airway Equipment and Method: Stylet Placement Confirmation: ETT inserted through vocal cords under direct vision,  positive ETCO2 and breath sounds checked- equal and bilateral Secured at: 22 cm Tube secured with: Tape Dental Injury: Teeth and Oropharynx as per pre-operative assessment        

## 2017-06-10 NOTE — Anesthesia Post-op Follow-up Note (Signed)
Anesthesia QCDR form completed.        

## 2017-06-10 NOTE — Anesthesia Postprocedure Evaluation (Signed)
Anesthesia Post Note  Patient: Matthew Brown  Procedure(s) Performed: LIGATION OF ARTERIOVENOUS  FISTULA (Right )  Patient location during evaluation: PACU Anesthesia Type: General Level of consciousness: awake and alert and oriented Pain management: pain level controlled Vital Signs Assessment: post-procedure vital signs reviewed and stable Respiratory status: spontaneous breathing Cardiovascular status: blood pressure returned to baseline Anesthetic complications: no     Last Vitals:  Vitals:   06/10/17 1432 06/10/17 1447  BP:  (!) 180/62  Pulse: (!) 57 (!) 57  Resp: 12   Temp: 37.2 C 36.9 C  SpO2: 100% 100%    Last Pain:  Vitals:   06/10/17 1432  TempSrc:   PainSc: 0-No pain                 [place]

## 2017-06-10 NOTE — Discharge Instructions (Signed)
AMBULATORY SURGERY  °DISCHARGE INSTRUCTIONS ° ° °1) The drugs that you were given will stay in your system until tomorrow so for the next 24 hours you should not: ° °A) Drive an automobile °B) Make any legal decisions °C) Drink any alcoholic beverage ° ° °2) You may resume regular meals tomorrow.  Today it is better to start with liquids and gradually work up to solid foods. ° °You may eat anything you prefer, but it is better to start with liquids, then soup and crackers, and gradually work up to solid foods. ° ° °3) Please notify your doctor immediately if you have any unusual bleeding, trouble breathing, redness and pain at the surgery site, drainage, fever, or pain not relieved by medication. ° ° ° °4) Additional Instructions: ° ° ° ° ° ° ° °Please contact your physician with any problems or Same Day Surgery at [phone], Monday through Friday 6 am to 4 pm, or [place] [facility] [place] Main number at [phone]. °

## 2017-06-10 NOTE — H&P (Signed)
[facility] History & Physical Update  The patient was interviewed and re-examined.  The patient's previous History and Physical has been reviewed and is unchanged.  There is no change in the plan of care. We plan to proceed with the scheduled procedure.  Leotis Pain, MD  06/10/2017, 12:28 PM

## 2017-06-10 NOTE — Pre-Procedure Instructions (Signed)
CBC results sent to Dr. Dew and Anesthesia for review. 

## 2017-06-10 NOTE — Progress Notes (Signed)
Pulse positive to right wrist   Can wiggle fingers

## 2017-06-10 NOTE — Telephone Encounter (Signed)
Already filled

## 2017-06-10 NOTE — Op Note (Signed)
[facility]   OPERATIVE NOTE  DATE: 06/10/2017  PRE-OPERATIVE DIAGNOSIS: Thrombosed, aneurysmal, painful right radiocephalic AV fistula no longer being used in a patient who has had a renal transplant  POST-OPERATIVE DIAGNOSIS: same as above  PROCEDURE: 1.   Ligation and excision of right radiocephalic AV fistula  SURGEON: Leotis Pain, MD  ASSISTANT(S): None  ANESTHESIA: General  ESTIMATED BLOOD LOSS: 10 cc  FINDING(S): 1.  none  SPECIMEN(S):  None  INDICATIONS:   Patient is a 76 y.o.male who presents with a very large, thrombosed, painful right radiocephalic AV fistula which is no longer being used as he has had a plant and is no longer on dialysis.  The area has become increasingly painful despite conservative therapy and he desires to have it excised. Risks and benefits were discussed and the patient was agreeable to proceed.  DESCRIPTION: After obtaining full informed written consent, the patient was brought back to the operating room and placed supine upon the operating table.  The patient received IV antibiotics prior to induction.  After obtaining adequate anesthesia, the patient was prepped and draped in the standard fashion. I created an incision from the radiocephalic anastomosis up to the venous access site proximal forearm overlying the fistula.  Fistula was dissected out.  It was markedly aneurysmal and somewhat calcific.  It was thrombosed.  The dissection was taken up to a branch in the proximal forearm beyond the aneurysmal area where the vein became patent once more.  I then ligated the branch and the cephalic vein in the proximal forearm and then began dissecting out the fistula.  The fistula was dissected out in its entirety without difficulty.  This was taken back to just at and beyond the radiocephalic anastomosis where there was flow.  Using an angled vascular clamp was clamped just beyond the radiocephalic anastomosis within the cephalic vein  taking care not to occlude the radial artery in the process.  The vein was then transected just beyond the clamp and the thrombosed aneurysmal cephalic vein portion of the fistula was removed from the field and sent as a specimen.  The stump of the cephalic vein just beyond the radiocephalic anastomosis was then oversewn with suture.  The wound was then irrigated and closed with a 3-0 Vicryl and a 4-0 Monocryl. Dermabond was placed as a dressing. The patient was taken to the recovery room in stable condition having tolerated the procedure well.  COMPLICATIONS: None  CONDITION: Stable   Leotis Pain 06/10/2017 2:04 PM  This note was created with Dragon Medical transcription system. Any errors in dictation are purely unintentional.

## 2017-06-11 ENCOUNTER — Encounter (HOSPITAL_COMMUNITY): Payer: Self-pay

## 2017-06-11 ENCOUNTER — Encounter: Payer: Self-pay | Admitting: Vascular Surgery

## 2017-06-11 LAB — SURGICAL PATHOLOGY

## 2017-06-11 LAB — CHROMOSOME ANALYSIS, BONE MARROW

## 2017-06-11 MED FILL — PROGRAF/1MG/CAP: PROGRAF/1MG/CAP | 30 days supply | Qty: 180 | Fill #5

## 2017-06-24 ENCOUNTER — Encounter (INDEPENDENT_AMBULATORY_CARE_PROVIDER_SITE_OTHER): Payer: Self-pay | Admitting: Vascular Surgery

## 2017-06-24 ENCOUNTER — Ambulatory Visit (INDEPENDENT_AMBULATORY_CARE_PROVIDER_SITE_OTHER): Payer: Medicare HMO | Admitting: Vascular Surgery

## 2017-06-24 VITALS — BP 142/63 | HR 70 | Resp 16 | Wt 116.0 lb

## 2017-06-24 DIAGNOSIS — T829XXS Unspecified complication of cardiac and vascular prosthetic device, implant and graft, sequela: Secondary | ICD-10-CM

## 2017-06-24 NOTE — Progress Notes (Signed)
Subjective:    Patient ID: Matthew Brown, male    DOB: 01-13-1941, 77 y.o.   MRN: [id number] Chief Complaint  Patient presents with  . Follow-up    [facility] 2 wk    Patient presents for his first postoperative follow-up.  The patient is status post ligation and excision of a right radiocephalic AV fistula due to it being thrombosed, aneurysmal, and painful in a patient who has had a renal transplant on 06/10/17.  The patient presents today without complaint.  The patient's postoperative course has been uneventful.  The patient denies any issues with his incision.  The patient denies any fever, nausea or vomiting.   Review of Systems  Constitutional: Negative.   HENT: Negative.   Eyes: Negative.   Respiratory: Negative.   Cardiovascular: Negative.   Gastrointestinal: Negative.   Endocrine: Negative.   Genitourinary: Negative.   Musculoskeletal: Negative.   Skin: Negative.   Allergic/Immunologic: Negative.   Neurological: Negative.   Hematological: Negative.   Psychiatric/Behavioral: Negative.       Objective:   Physical Exam  Constitutional: He is oriented to person, place, and time. He appears well-developed and well-nourished. No distress.  HENT:  Head: Normocephalic and atraumatic.  Eyes: Conjunctivae are normal. Pupils are equal, round, and reactive to light.  Neck: Normal range of motion.  Cardiovascular: Normal rate, regular rhythm, normal heart sounds and intact distal pulses.  Pulses:      Radial pulses are 2+ on the right side, and 2+ on the left side.  Right upper extremity: Incision is intact.  Incision is healing.  There is no drainage.  Pulmonary/Chest: Effort normal and breath sounds normal.  Musculoskeletal: Normal range of motion. He exhibits no edema.  Neurological: He is alert and oriented to person, place, and time.  Skin: Skin is warm and dry. He is not diaphoretic.  Psychiatric: He has a normal mood and affect. His behavior is normal. Judgment and thought  content normal.  Vitals reviewed.  BP (!) 142/63 (BP Location: Left Arm)   Pulse 70   Resp 16   Wt 116 lb (52.6 kg)   BMI 19.30 kg/m   Past Medical History:  Diagnosis Date  . Benign prostatic hypertrophy   . Chronic headache 10/19/2015  . Dysrhythmia   . ED (erectile dysfunction)   . End stage renal disease ([place])   . Essential hypertension   . GERD (gastroesophageal reflux disease)   . GIB (gastrointestinal bleeding)    a. 01/1828 s/p R colic artery embolization;  b. 02/2015 EGD: duod ulcerative mass->Bx notable for coagulative necrosis - ? ischemia vs thrombosis-->coumadin d/c'd.  . Gout   . Hearing loss   . Hemorrhoids   . Hyperlipidemia   . Lymphoma ([place])   . Lymphoma ([place]) 2017  . Multiple thyroid nodules 06/06/2016   Noted on carotid US; dedicated US to be ordered by staff  . Osteoarthrosis, unspecified whether generalized or localized, lower leg   . Persistent atrial fibrillation ([place])    a. CHA2DS2VASc = 3-->coumadin d/c'd 02/2015 2/2 recurrent GIB.  [name] Pneumonia    2016  . Prostatitis   . Pulmonary hypertension ([place])    a. 10/2014 Echo: EF 60-65%, mild to mod MR, mildly dil LA, nl RV, PASP 74mmHg.  [name] Renal transplant recipient   . Ulcers of both great toes [facility])    Social History   Socioeconomic History  . Marital status: Divorced    Spouse name: Not on file  . Number of  children: Not on file  . Years of education: Not on file  . Highest education level: Not on file  Social Needs  . Financial resource strain: Not on file  . Food insecurity - worry: Not on file  . Food insecurity - inability: Not on file  . Transportation needs - medical: Not on file  . Transportation needs - non-medical: Not on file  Occupational History  . Occupation: Retired    Fish farm manager: RETIRED  Tobacco Use  . Smoking status: Former Smoker    Packs/day: 1.00    Years: 25.00    Pack years: 25.00    Types: Cigarettes    Last attempt to quit: 06/23/1978    Years since quitting: 39.0    . Smokeless tobacco: Never Used  Substance and Sexual Activity  . Alcohol use: No  . Drug use: No  . Sexual activity: Yes  Other Topics Concern  . Not on file  Social History Narrative   Divorced   Does not get regular exercise   Past Surgical History:  Procedure Laterality Date  . AV FISTULA PLACEMENT  1998  . BACK SURGERY    . ESOPHAGOGASTRODUODENOSCOPY  03/13/15   severe esophagitis, ulcerated mass  . ESOPHAGOGASTRODUODENOSCOPY (EGD) WITH PROPOFOL N/A 07/09/2016   Procedure: ESOPHAGOGASTRODUODENOSCOPY (EGD) WITH PROPOFOL;  Surgeon: [name], MD;  Location: [facility] ENDOSCOPY;  Service: Endoscopy;  Laterality: N/A;  . HERNIA REPAIR  1974  . KIDNEY TRANSPLANT  2006  . LIGATION OF ARTERIOVENOUS  FISTULA Right 06/10/2017   Procedure: LIGATION OF ARTERIOVENOUS  FISTULA;  Surgeon: Algernon Huxley, MD;  Location: [facility] ORS;  Service: Vascular;  Laterality: Right;  . PERIPHERAL VASCULAR CATHETERIZATION N/A 05/07/2016   Procedure: Glori Luis Cath Insertion;  Surgeon: Algernon Huxley, MD;  Location: [place] CV LAB;  Service: Cardiovascular;  Laterality: N/A;  . PROSTATE ABLATION    . STOMACH SURGERY     blood vessel burst  . THROAT SURGERY    . TOTAL KNEE ARTHROPLASTY     Family History  Problem Relation Age of Onset  . Cancer Mother        throat  . Diabetes Brother   . Heart disease Brother   . Stroke Brother   . Hypertension Brother   . Diabetes Sister   . Heart disease Sister   . Hypertension Sister   . Diabetes Sister   . Diabetes Brother   . COPD Neg Hx   . Kidney disease Neg Hx   . Prostate cancer Neg Hx   . Kidney cancer Neg Hx   . Bladder Cancer Neg Hx    No Known Allergies     Assessment & Plan:  Patient presents for his first postoperative follow-up.  The patient is status post ligation and excision of a right radiocephalic AV fistula due to it being thrombosed, aneurysmal, and painful in a patient who has had a renal transplant on 06/10/17.  The patient presents  today without complaint.  The patient's postoperative course has been uneventful.  The patient denies any issues with his incision.  The patient denies any fever, nausea or vomiting.  1. Complication of vascular access for dialysis, sequela - Stable The patient is status post the ligation and removal of a right radiocephalic AV fistula. The patient does not need creation of a new dialysis access as he is status post a functioning kidney transplant. The patient's postoperative course has been unremarkable The patient's incision/operative site is healing well The patient should follow-up in  our office as needed.  Current Outpatient Medications on File Prior to Visit  Medication Sig Dispense Refill  . acetaminophen (TYLENOL) 325 MG tablet Take 2 tablets (650 mg total) by mouth every 6 (six) hours as needed for mild pain (or Fever >/= 101).    [name] albuterol (PROAIR HFA) 108 (90 BASE) MCG/ACT inhaler Inhale 1-2 puffs into the lungs every 4 (four) hours as needed.     [name] allopurinol (ZYLOPRIM) 100 MG tablet Take 2 tablets (200 mg total) by mouth daily. 60 tablet 2  . COLCRYS 0.6 MG tablet Take 1 tablet by mouth 2 (two) times daily as needed (gout).     . finasteride (PROSCAR) 5 MG tablet Take 1 tablet (5 mg total) by mouth daily. 90 tablet 3  . furosemide (LASIX) 20 MG tablet Take 20 mg by mouth every other day.     . hydrochlorothiazide (HYDRODIURIL) 25 MG tablet Take 25 mg by mouth daily.    [name] HYDROcodone-acetaminophen (NORCO) 5-325 MG tablet Take 1 tablet by mouth every 6 (six) hours as needed for moderate pain. 30 tablet 0  . hydroxypropyl methylcellulose (ISOPTO TEARS) 2.5 % ophthalmic solution Place 1 drop into both eyes as needed.     . megestrol (MEGACE) 400 MG/10ML suspension Take 5 mLs (200 mg total) by mouth daily. 240 mL 0  . metoprolol tartrate (LOPRESSOR) 25 MG tablet Take 12.5 mg by mouth 2 (two) times daily.     . Multiple Vitamin (MULTIVITAMIN) tablet Take 1 tablet by mouth daily.       [name] oxybutynin (DITROPAN-XL) 5 MG 24 hr tablet Take 1 tablet (5 mg total) by mouth daily. 90 tablet 3  . oxyCODONE-acetaminophen (ROXICET) 5-325 MG tablet Take 1 tablet by mouth every 6 (six) hours as needed. 30 tablet 0  . pantoprazole (PROTONIX) 40 MG tablet Take 1 tablet (40 mg total) by mouth 2 (two) times daily. (Patient taking differently: Take 40 mg by mouth 2 (two) times daily as needed. ) 60 tablet 1  . predniSONE (DELTASONE) 5 MG tablet Take 5 mg by mouth daily.     . simethicone (MYLICON) 80 MG chewable tablet Chew 1 tablet (80 mg total) by mouth every 6 (six) hours as needed for flatulence. 120 tablet 0  . sucralfate (CARAFATE) 1 g tablet Take 1 tablet (1 g total) by mouth 4 (four) times daily. Resume taking after one week- once finished taking oral levaquine. ( to avoid interaction.) (Patient taking differently: Take 1 g by mouth 4 (four) times daily as needed. Resume taking after one week- once finished taking oral levaquine. ( to avoid interaction.)) 40 tablet 3  . tacrolimus (PROGRAF) 1 MG capsule Take 3 mg by mouth 2 (two) times daily. Reported on 08/16/2015    . tamsulosin (FLOMAX) 0.4 MG CAPS capsule Take 1 capsule (0.4 mg total) by mouth daily. 90 capsule 3  . diphenhydrAMINE (BENADRYL) 25 mg capsule Take 1 capsule (25 mg total) by mouth at bedtime as needed for sleep. (Patient not taking: Reported on 06/09/2017) 30 capsule 0  . lenalidomide (REVLIMID) 5 MG capsule Take 1 capsule (5 mg total) daily by mouth. for 21 days then off 7 days. Do NOT start until instructed by MD (Patient not taking: Reported on 06/09/2017) 21 capsule 0  . ondansetron (ZOFRAN) 4 MG tablet Take 1 tablet (4 mg total) by mouth every 6 (six) hours as needed for nausea. (Patient not taking: Reported on 06/09/2017) 20 tablet 0   Current Facility-Administered Medications on  File Prior to Visit  Medication Dose Route Frequency Provider Last Rate Last Dose  . heparin lock flush 100 unit/mL  500 Units Intravenous Once  Corcoran, Melissa C, MD      . heparin lock flush 100 unit/mL  500 Units Intravenous Once Corcoran, Melissa C, MD      . heparin lock flush 100 unit/mL  500 Units Intravenous Once Corcoran, Melissa C, MD      . sodium chloride flush (NS) 0.9 % injection 10 mL  10 mL Intracatheter PRN Corcoran, Melissa C, MD      . sodium chloride flush (NS) 0.9 % injection 10 mL  10 mL Intravenous PRN Corcoran, Melissa C, MD      . sodium chloride flush (NS) 0.9 % injection 10 mL  10 mL Intravenous Once Lequita Asal, MD       There are no Patient Instructions on file for this visit. No Follow-up on file.  Fae Blossom A Ephraim Reichel, PA-C

## 2017-06-25 ENCOUNTER — Encounter: Payer: Self-pay | Admitting: Hematology and Oncology

## 2017-06-26 ENCOUNTER — Ambulatory Visit: Admit: 2017-06-26 | Discharge: 2017-06-26 | Payer: MEDICARE

## 2017-06-26 ENCOUNTER — Other Ambulatory Visit: Admit: 2017-06-26 | Discharge: 2017-06-26 | Payer: MEDICARE

## 2017-06-26 DIAGNOSIS — M1711 Unilateral primary osteoarthritis, right knee: Secondary | ICD-10-CM

## 2017-06-26 DIAGNOSIS — C8336 Diffuse large B-cell lymphoma, intrapelvic lymph nodes: Principal | ICD-10-CM

## 2017-06-26 DIAGNOSIS — K4090 Unilateral inguinal hernia, without obstruction or gangrene, not specified as recurrent: Secondary | ICD-10-CM

## 2017-06-26 DIAGNOSIS — M549 Dorsalgia, unspecified: Secondary | ICD-10-CM

## 2017-06-26 DIAGNOSIS — C8330 Diffuse large B-cell lymphoma, unspecified site: Principal | ICD-10-CM

## 2017-06-26 LAB — LIPID PANEL
CHOLESTEROL/HDL RATIO SCREEN: 3.5 (ref <5.0)
CHOLESTEROL: 138 mg/dL (ref 100–199)
LDL CHOLESTEROL CALCULATED: 78 mg/dL (ref 60–99)
TRIGLYCERIDES: 102 mg/dL (ref 1–149)
VLDL CHOLESTEROL CAL: 20.4 mg/dL (ref 12–42)

## 2017-06-26 LAB — CBC W/ AUTO DIFF
BASOPHILS ABSOLUTE COUNT: 0 10*9/L (ref 0.0–0.1)
EOSINOPHILS ABSOLUTE COUNT: 0 10*9/L (ref 0.0–0.4)
HEMATOCRIT: 27.8 % — ABNORMAL LOW (ref 41.0–53.0)
HEMOGLOBIN: 9.5 g/dL — ABNORMAL LOW (ref 13.5–17.5)
LARGE UNSTAINED CELLS: 2 % (ref 0–4)
LYMPHOCYTES ABSOLUTE COUNT: 0.3 10*9/L — ABNORMAL LOW (ref 1.5–5.0)
MEAN CORPUSCULAR HEMOGLOBIN CONC: 34.2 g/dL (ref 31.0–37.0)
MEAN CORPUSCULAR HEMOGLOBIN: 28.6 pg (ref 26.0–34.0)
MEAN PLATELET VOLUME: 8 fL (ref 7.0–10.0)
MONOCYTES ABSOLUTE COUNT: 0.2 10*9/L (ref 0.2–0.8)
PLATELET COUNT: 125 10*9/L — ABNORMAL LOW (ref 150–440)
RED BLOOD CELL COUNT: 3.32 10*12/L — ABNORMAL LOW (ref 4.50–5.90)
RED CELL DISTRIBUTION WIDTH: 16.8 % — ABNORMAL HIGH (ref 12.0–15.0)

## 2017-06-26 LAB — TACROLIMUS, TROUGH: Lab: 7.6

## 2017-06-26 LAB — BASIC METABOLIC PANEL
ANION GAP: 7 mmol/L — ABNORMAL LOW (ref 9–15)
BUN / CREAT RATIO: 19
CALCIUM: 11.2 mg/dL — ABNORMAL HIGH (ref 8.5–10.2)
CHLORIDE: 100 mmol/L (ref 98–107)
CREATININE: 1.13 mg/dL (ref 0.70–1.30)
EGFR MDRD AF AMER: >=60 mL/min/{1.73_m2} (ref >=60)
EGFR MDRD NON AF AMER: >=60 mL/min/{1.73_m2} (ref >=60)
GLUCOSE RANDOM: 117 mg/dL (ref 65–179)
POTASSIUM: 4.8 mmol/L (ref 3.5–5.0)
SODIUM: 134 mmol/L — ABNORMAL LOW (ref 135–145)

## 2017-06-26 LAB — HEPATIC FUNCTION PANEL
ALBUMIN: 3.7 g/dL (ref 3.5–5.0)
ALKALINE PHOSPHATASE: 89 U/L (ref 38–126)
ALT (SGPT): 16 U/L — ABNORMAL LOW (ref 19–72)
BILIRUBIN DIRECT: 0.3 mg/dL (ref 0.00–0.40)
PROTEIN TOTAL: 5.8 g/dL — ABNORMAL LOW (ref 6.5–8.3)

## 2017-06-26 LAB — MAGNESIUM: Magnesium:MCnc:Pt:Ser/Plas:Qn:: 1.4 — ABNORMAL LOW

## 2017-06-26 LAB — EGFR MDRD AF AMER: Glomerular filtration rate/1.73 sq M.predicted.black:ArVRat:Pt:Ser/Plas/Bld:Qn:Creatinine-based formula (MDRD): >=60

## 2017-06-26 LAB — VLDL CHOLESTEROL CAL: Cholesterol.in VLDL:MCnc:Pt:Ser/Plas:Qn:Calculated: 20.4

## 2017-06-26 LAB — PHOSPHORUS: Phosphate:MCnc:Pt:Ser/Plas:Qn:: 2.8 — ABNORMAL LOW

## 2017-06-26 LAB — RED BLOOD CELL COUNT: Lab: 3.32 — ABNORMAL LOW

## 2017-06-26 LAB — ALBUMIN: Albumin:MCnc:Pt:Ser/Plas:Qn:: 3.7

## 2017-06-26 NOTE — Unmapped (Addendum)
IDENTIFICATION: This is a 77 y.o. male who presents for an return visit.     Diagnosis: PTLD  Stage: IV  CNS Risk: low  GELF: N/A  Regimen: R2 (07/10/17-); R2 x99mo (03/2017-04/2017); s/p O-GemOx x2 (02/04/17-02/24/17); s/p R-miniCHOP x6 (10/2016)  The patient was referred to the [facility] by Dr. Merlene Pulling from [facility].    STAGING/WORKUP:  The patient initially presented to [facility] for 2nd opinion regarding his advanced, relapsed PTLD with involvement in the mesentery, L IJ LN, pulmonary nodules, peritoneum on recent PET (7/25). Compared to prior PET on 10/29/16, the disease had shown significant progression. The patient's CBC showed pancytopenia, and his CMP showed elevated creatinine, calcium, and normal LFTs. His LDH was elevated to 1262. HIV and HBV negative.     PROGNOSIS:  Relapsed PTLD is a/w a poor prognosis. Particularly in a patient with advanced age and poor PS, who is not a transplant candidate.    TREATMENT:  The patient was treated with frontline R-miniCHOP that completed in 10/2016. He had a fast relapse of <3 mo. Unfortunately, options were limited for this patient. He was not a candidate for aggressive salvage therapy and consolidation with autoSCT. I recommended O-GemOx at the time and he tolerated 2 cycles but progressed. After a discussion with Dr. Merlene Pulling, I then recommended R2, which was started, but he has been delayed d/t cytopenias. The patient returns to see me for another opinion today, and I recommend repeating a PET-CT since he has been off therapy for so long, and then restarting low-dose R2 (rev 2.5mg ). If his CBC is stable, we can increase the dose. At this point, the patient requested to follow here at [facility], which I confirmed is okay with Dr. Merlene Pulling.     Of note, he would not be a candidate for the EBV-CTL study because he is not positive for EBV. CD30 is negative, so BV is not an option. Will d/w his primary oncologist, Dr. Merlene Pulling.     OTHER ISSUES:  Worsening OA in R Knee: will refer to ortho here at [facility] for an opinion.     Chronic Back Pain: Pain clinic referral.     Inguinal Hernia: reducible, but very large and becoming more painful. Gen surg referral.    A total of 40 minutes were spent face-to-face with the patient during this encounter and over half of that time was spent on counseling and coordination of care.     Winona Legato, DO, MPH  Assistant Professor of Medicine  Division of Hematology and Oncology    ---------------------------------------------------    INTERVAL HISTORY:  Pt returns with recurrent PTLD after several lines of therapy. He has back and knee pain as well as a reducible hernia. Denies B-symptoms, CP, SOB.    PAST MEDICAL HISTORY:  Past Medical History:   Diagnosis Date   ??? Arthritis    ??? Chronic kidney disease    ??? Coronary artery disease    ??? Gout    ??? History of transfusion    ??? Hypertension    ??? Kidney transplant status, cadaveric 2006   ??? Peptic ulceration        MEDICATIONS:  Current Outpatient Prescriptions   Medication Sig Dispense Refill   ??? albuterol (PROAIR HFA) 90 mcg/actuation inhaler Inhale 2 puffs every four (4) hours as needed. 1 Inhaler 11   ??? allopurinol (ZYLOPRIM) 100 MG tablet Take 2 tablets (200 mg total) by mouth daily. 180 tablet 3   ??? allopurinol (ZYLOPRIM) 100  MG tablet TAKE 1 TABLET (100 MG TOTAL) BY MOUTH DAILY. 90 tablet 3   ??? carboxymethylcellulose sodium (THERATEARS) 0.25 % Drop Administer 2 drops to both eyes 4 (four) times a day as needed (Dry eyes). 30 mL 2   ??? colchicine 0.6 mg cap capsule Take 0.6 mg by mouth daily.     ??? finasteride (PROSCAR) 5 mg tablet TAKE 1 TABLET (5 MG TOTAL) BY MOUTH DAILY. 90 tablet 3   ??? hydroCHLOROthiazide (HYDRODIURIL) 25 MG tablet Take 25 mg by mouth.     ??? hydroxypropyl methylcellulose (ISOPTO TEARS) 2.5 % ophthalmic solution 1 drop as needed.     ??? megestrol (MEGACE) 400 mg/10 mL (40 mg/mL) suspension Take 200 mg by mouth.     ??? metoprolol tartrate (LOPRESSOR) 25 MG tablet Take 0.5 tablets (12.5 mg total) by mouth Two (2) times a day. 30 tablet 11   ??? miscellaneous medical supply (C-TUB) Misc Take 1 Bottle by mouth.     ??? multivitamin (MULTIVITAMIN) per tablet Take 1 tablet by mouth daily.      ??? oxybutynin (DITROPAN-XL) 5 MG 24 hr tablet Take 5 mg by mouth daily. TAKE 1 TABLET (5 MG TOTAL) BY MOUTH DAILY.  3   ??? oxyCODONE-acetaminophen (PERCOCET) 5-325 mg per tablet Take 5-325 tablets by mouth.     ??? pantoprazole (PROTONIX) 40 MG tablet TAKE 1 TABLET BY MOUTH TWICE A DAY 60 tablet 1   ??? predniSONE (DELTASONE) 5 MG tablet TAKE 1 TABLET (5 MG TOTAL) BY MOUTH DAILY. 90 tablet 3   ??? predniSONE (DELTASONE) 5 MG tablet TAKE 1 TABLET (5 MG TOTAL) BY MOUTH DAILY. 90 tablet 3   ??? simethicone (GAS RELIEF) 80 MG chewable tablet Chew 80 mg.     ??? sucralfate (CARAFATE) 1 gram tablet Take 1 g by mouth Four (4) times a day.     ??? tacrolimus (PROGRAF) 1 MG capsule Take 3 capsules (3mg ) in the morning and 3 capsules (3mg ) at night. Z94.0 180 capsule 11   ??? tamsulosin (FLOMAX) 0.4 mg capsule TAKE 1 CAPSULE (0.4 MG TOTAL) BY MOUTH TWO (2) TIMES A DAY. 180 capsule 3   ??? acetaminophen (TYLENOL) 325 MG tablet Take by mouth every six (6) hours as needed for pain.     ??? diphenhydrAMINE (BENADRYL) 25 mg capsule Take 25 mg by mouth.     ??? furosemide (LASIX) 20 MG tablet 1-2 tablets daily as needed (Patient not taking: Reported on 06/26/2017) 60 tablet 11   ??? lactulose (CHRONULAC) 10 gram/15 mL solution Take 10 mL by mouth.     ??? ondansetron (ZOFRAN) 4 MG tablet Take 4 mg by mouth.     ??? oxybutynin (DITROPAN-XL) 5 MG 24 hr tablet TAKE 1 TABLET (5 MG TOTAL) BY MOUTH ONCE DAILY. 90 tablet 3     Current Facility-Administered Medications   Medication Dose Route Frequency Provider Last Rate Last Dose   ??? heparin, porcine (PF) 100 unit/mL injection 500 Units  500 Units Intravenous Q30 Min PRN Nita Sells, MD   500 Units at 06/26/17 1245   ??? sodium chloride (NS) 0.9 % flush 20 mL  20 mL Intravenous Q30 Min PRN [place] Crosby Amare Bail, MD   20 mL at 06/26/17 1245       ALLERGIES:  No Known Allergies    SOCIAL HISTORY:  Social History     Social History   ??? Marital status: Single     Spouse name: N/A   ??? Number of  children: N/A   ??? Years of education: N/A     Social History Main Topics   ??? Smoking status: Former Smoker     Quit date: 03/17/1980   ??? Smokeless tobacco: Never Used   ??? Alcohol use No   ??? Drug use: No   ??? Sexual activity: Not on file     Other Topics Concern   ??? Not on file     Social History Narrative   ??? No narrative on file       FAMILY HISTORY:  Family History   Problem Relation Age of Onset   ??? Cancer Mother    ??? Anesthesia problems Neg Hx        REVIEW OF SYSTEMS:  See HPI. A 10 system ROS is otherwise negative.    VITAL SIGNS:   Vitals:    06/26/17 1335   BP: 154/67   Pulse: 65   Resp: 16   Temp: 36.8 ??C (98.2 ??F)   TempSrc: Oral   SpO2: 100%   Weight: 51.9 kg (114 lb 6.4 oz)   Height: 167.6 cm (5' 5.98)       EXAM:  ECOG: 2  CONST: Frail, NAD, Awake, Alert  HEENT: Oropharynx clear.   LYMPH: no palpable axillary, inguinal, cervical LAD  RESP: Clear to auscultation bilaterally.  CV: Irregular rhythm. No rubs, gallops or murmurs.   GI: Soft, nontender, nondistended. No hepatosplenomegaly.   MSK: trace edema  NEURO: No focal deficits    LABORATORY:  Lab on 06/26/2017   Component Date Value Ref Range Status   ??? Sodium 06/26/2017 134* 135 - 145 mmol/L Final   ??? Potassium 06/26/2017 4.8  3.5 - 5.0 mmol/L Final   ??? Chloride 06/26/2017 100  98 - 107 mmol/L Final   ??? CO2 06/26/2017 27.0  22.0 - 30.0 mmol/L Final   ??? BUN 06/26/2017 21  7 - 21 mg/dL Final   ??? Creatinine 06/26/2017 1.13  0.70 - 1.30 mg/dL Final   ??? BUN/Creatinine Ratio 06/26/2017 19   Final   ??? EGFR MDRD Non Af Amer 06/26/2017 >=60  >=60 mL/min/1.64m2 Final   ??? EGFR MDRD Af Amer 06/26/2017 >=60  >=60 mL/min/1.59m2 Final   ??? Anion Gap 06/26/2017 7* 9 - 15 mmol/L Final   ??? Glucose 06/26/2017 117  65 - 179 mg/dL Final   ??? Calcium 16/03/9603 11.2* 8.5 - 10.2 mg/dL Final   ??? Magnesium 54/02/8118 1.4* 1.6 - 2.2 mg/dL Final   ??? Albumin 14/78/2956 3.7  3.5 - 5.0 g/dL Final   ??? Total Protein 06/26/2017 5.8* 6.5 - 8.3 g/dL Final   ??? Total Bilirubin 06/26/2017 0.6  0.0 - 1.2 mg/dL Final   ??? Bilirubin, Direct 06/26/2017 0.30  0.00 - 0.40 mg/dL Final   ??? AST 21/30/8657 37  19 - 55 U/L Final   ??? ALT 06/26/2017 16* 19 - 72 U/L Final   ??? Alkaline Phosphatase 06/26/2017 89  38 - 126 U/L Final   ??? Phosphorus 06/26/2017 2.8* 2.9 - 4.7 mg/dL Final   ??? Triglycerides 06/26/2017 102  1 - 149 mg/dL Final   ??? Cholesterol 06/26/2017 138  100 - 199 mg/dL Final   ??? HDL 84/69/6295 40  40 - 59 mg/dL Final   ??? LDL Calculated 06/26/2017 78  60 - 99 mg/dL Final      NHLBI Recommended Ranges, LDL Cholesterol, for Adults (20+yrs) (ATPIII), mg/dL  Optimal              <  100  Near Optimal        100-129  Borderline High     130-159  High                160-189  Very High            >=190  NHLBI Recommended Ranges, LDL Cholesterol, for Children (2-19 yrs), mg/dL  Desirable            <161  Borderline High     110-129  High                 >=130     ??? VLDL Cholesterol Cal 06/26/2017 20.4  12 - 42 mg/dL Final   ??? Chol/HDL Ratio 06/26/2017 3.5  <0.9 Final   ??? Non-HDL Cholesterol 06/26/2017 98  mg/dL Final      Non-HDL Cholesterol Recommended Ranges (mg/dL)  Optimal       <604  Near Optimal 130 - 159  Borderline High 160 - 189  High             190 - 219  Very High       >220     ??? FASTING 06/26/2017 Unknown   Final   ??? WBC 06/26/2017 2.3* 4.5 - 11.0 10*9/L Final   ??? RBC 06/26/2017 3.32* 4.50 - 5.90 10*12/L Final   ??? HGB 06/26/2017 9.5* 13.5 - 17.5 g/dL Final   ??? HCT 54/02/8118 27.8* 41.0 - 53.0 % Final   ??? MCV 06/26/2017 83.6  80.0 - 100.0 fL Final   ??? MCH 06/26/2017 28.6  26.0 - 34.0 pg Final   ??? MCHC 06/26/2017 34.2  31.0 - 37.0 g/dL Final   ??? RDW 14/78/2956 16.8* 12.0 - 15.0 % Final   ??? MPV 06/26/2017 8.0  7.0 - 10.0 fL Final   ??? Platelet 06/26/2017 125* 150 - 440 10*9/L Final   ??? Variable HGB Concentration 06/26/2017 Moderate* Not Present Final   ??? Absolute Neutrophils 06/26/2017 1.8* 2.0 - 7.5 10*9/L Final   ??? Absolute Lymphocytes 06/26/2017 0.3* 1.5 - 5.0 10*9/L Final   ??? Absolute Monocytes 06/26/2017 0.2  0.2 - 0.8 10*9/L Final   ??? Absolute Eosinophils 06/26/2017 0.0  0.0 - 0.4 10*9/L Final   ??? Absolute Basophils 06/26/2017 0.0  0.0 - 0.1 10*9/L Final   ??? Large Unstained Cells 06/26/2017 2  0 - 4 % Final   ??? Microcytosis 06/26/2017 Slight* Not Present Final   ??? Anisocytosis 06/26/2017 Slight* Not Present Final   ??? Hyperchromasia 06/26/2017 Slight* Not Present Final   ??? Hypochromasia 06/26/2017 Slight* Not Present Final     IMAGING:  PET-CT (07/10/17):  Pending.    PET-CT (01/14/17):  1. Marked progression of disease as evidenced by progressive bulky mesenteric adenopathy, new low left internal jugular/left juxtadiaphragmatic/abdominal retroperitoneal adenopathy, new pulmonary  nodules, new hepatic and splenic lesions and new peritoneal nodules, all of which are hypermetabolic.    PET-CT (10/29/16):  Hypermetabolic splenic foci and mesenteric hypermetabolic masses, worrisome for lymphoma involvement. Lymphoma involvement in subtle retroperitoneal lymph nodes is also suspected.  0.8 cm hypermetabolic nodule in the right kidney, worrisome for a primary renal malignancy process. Further evaluation is recommended.    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

## 2017-06-26 NOTE — Unmapped (Signed)
Labwork:  Lab on 06/26/2017   Component Date Value Ref Range Status   ??? Sodium 06/26/2017 134* 135 - 145 mmol/L Final   ??? Potassium 06/26/2017 4.8  3.5 - 5.0 mmol/L Final   ??? Chloride 06/26/2017 100  98 - 107 mmol/L Final   ??? CO2 06/26/2017 27.0  22.0 - 30.0 mmol/L Final   ??? BUN 06/26/2017 21  7 - 21 mg/dL Final   ??? Creatinine 06/26/2017 1.13  0.70 - 1.30 mg/dL Final   ??? BUN/Creatinine Ratio 06/26/2017 19   Final   ??? EGFR MDRD Non Af Amer 06/26/2017 >=60  >=60 mL/min/1.60m2 Final   ??? EGFR MDRD Af Amer 06/26/2017 >=60  >=60 mL/min/1.2m2 Final   ??? Anion Gap 06/26/2017 7* 9 - 15 mmol/L Final   ??? Glucose 06/26/2017 117  65 - 179 mg/dL Final   ??? Calcium 13/01/6577 11.2* 8.5 - 10.2 mg/dL Final   ??? Magnesium 46/96/2952 1.4* 1.6 - 2.2 mg/dL Final   ??? Albumin 84/13/2440 3.7  3.5 - 5.0 g/dL Final   ??? Total Protein 06/26/2017 5.8* 6.5 - 8.3 g/dL Final   ??? Total Bilirubin 06/26/2017 0.6  0.0 - 1.2 mg/dL Final   ??? Bilirubin, Direct 06/26/2017 0.30  0.00 - 0.40 mg/dL Final   ??? AST 04/19/2535 37  19 - 55 U/L Final   ??? ALT 06/26/2017 16* 19 - 72 U/L Final   ??? Alkaline Phosphatase 06/26/2017 89  38 - 126 U/L Final   ??? Phosphorus 06/26/2017 2.8* 2.9 - 4.7 mg/dL Final   ??? Triglycerides 06/26/2017 102  1 - 149 mg/dL Final   ??? Cholesterol 06/26/2017 138  100 - 199 mg/dL Final   ??? HDL 64/40/3474 40  40 - 59 mg/dL Final   ??? LDL Calculated 06/26/2017 78  60 - 99 mg/dL Final      NHLBI Recommended Ranges, LDL Cholesterol, for Adults (20+yrs) (ATPIII), mg/dL  Optimal              <259  Near Optimal        100-129  Borderline High     130-159  High                160-189  Very High            >=190  NHLBI Recommended Ranges, LDL Cholesterol, for Children (2-19 yrs), mg/dL  Desirable            <563  Borderline High     110-129  High                 >=130     ??? VLDL Cholesterol Cal 06/26/2017 20.4  12 - 42 mg/dL Final   ??? Chol/HDL Ratio 06/26/2017 3.5  <8.7 Final   ??? Non-HDL Cholesterol 06/26/2017 98  mg/dL Final      Non-HDL Cholesterol Recommended Ranges (mg/dL)  Optimal       <564  Near Optimal 130 - 159  Borderline High 160 - 189  High             190 - 219  Very High       >220     ??? FASTING 06/26/2017 Unknown   Final   ??? WBC 06/26/2017 2.3* 4.5 - 11.0 10*9/L Final   ??? RBC 06/26/2017 3.32* 4.50 - 5.90 10*12/L Final   ??? HGB 06/26/2017 9.5* 13.5 - 17.5 g/dL Final   ??? HCT 33/29/5188 27.8* 41.0 - 53.0 % Final   ???  MCV 06/26/2017 83.6  80.0 - 100.0 fL Final   ??? MCH 06/26/2017 28.6  26.0 - 34.0 pg Final   ??? MCHC 06/26/2017 34.2  31.0 - 37.0 g/dL Final   ??? RDW 16/03/9603 16.8* 12.0 - 15.0 % Final   ??? MPV 06/26/2017 8.0  7.0 - 10.0 fL Final   ??? Platelet 06/26/2017 125* 150 - 440 10*9/L Final   ??? Variable HGB Concentration 06/26/2017 Moderate* Not Present Final   ??? Absolute Neutrophils 06/26/2017 1.8* 2.0 - 7.5 10*9/L Final   ??? Absolute Lymphocytes 06/26/2017 0.3* 1.5 - 5.0 10*9/L Final   ??? Absolute Monocytes 06/26/2017 0.2  0.2 - 0.8 10*9/L Final   ??? Absolute Eosinophils 06/26/2017 0.0  0.0 - 0.4 10*9/L Final   ??? Absolute Basophils 06/26/2017 0.0  0.0 - 0.1 10*9/L Final   ??? Large Unstained Cells 06/26/2017 2  0 - 4 % Final   ??? Microcytosis 06/26/2017 Slight* Not Present Final   ??? Anisocytosis 06/26/2017 Slight* Not Present Final   ??? Hyperchromasia 06/26/2017 Slight* Not Present Final   ??? Hypochromasia 06/26/2017 Slight* Not Present Final       Please call us if you experience:   1. Nausea or vomiting not controlled by nausea medicines  2. Fever of 100.4 F or higher   3. Uncontrolled pain  4. Any other concerning symptom     For any cancer-related concerns, including:   Appointment information   Questions regarding your cancer diagnosis or treatment   Any new symptoms.     Please call [phone].    On Nights, Weekends and Holidays please and ask for the oncologist on call.    [facility]  [address]  [place], [place] [address]  [email or web address]

## 2017-06-26 NOTE — Unmapped (Signed)
Labs drawn and sent for analysis.  Care provided by  D Maxwell, RN

## 2017-06-28 LAB — URIC ACID: Urate:MCnc:Pt:Ser/Plas:Qn:: 4

## 2017-06-28 NOTE — Unmapped (Signed)
Hem/Onc Phone Triage Note    Caller: [name] - Patient's wife    Reason for Call: Clarification of Analgesia Regimen    Taylor Ramos is a 77 y.o. male with PTLD with poor prognosis. He was reviewed in clinic yesterday and patient's wife calls to clarify his analgesic dosing.  He was prescribed Percocet (5???325mg ) and [name] was wondering how often he can take it.    I clarified that the dosing is based on the Tylenol component and that he can take 2 tablets every 6 hours.  Patient has been taking 2 tablets and tolerated it well without any drowsiness.  It has had some improvement in his hip and knee pain.  He is awaiting outpatient follow-up with the chronic pain team and orthopedics for further evaluation on the etiology of the pain.    I will forward these concerns to his primary oncologist and see if we can expedite his pain team and orthopedic appointments.    Please page Oncology Consults at [phone] if patient needs admission or questions about care occur.     Fellow Taking Call:  Benay Pike  June 27, 2017 5:01 PM

## 2017-06-29 MED ORDER — LENALIDOMIDE 2.5 MG CAPSULE
ORAL_CAPSULE | Freq: Every day | ORAL | 0 refills | 0 days | Status: CP
Start: 2017-06-29 — End: 2017-06-30

## 2017-06-29 NOTE — Unmapped (Signed)
S/O: Patient has a history of relapsed PTLD which has been managed most recently with Rituximab + Revlimid. Treatment has been held due to cytopenias and Dr. Aviva Kluver would like to plan on re-starting Revlimid at a reduced dose of 2.5 mg PO daily. This will require a transfer in the patient's name in [facility]'s database to Dr. Aviva Kluver' dashboard and a new Rx sent to [facility] where he has been filling his Revlimid previously.    A/P: I spoke with the patient's caregiver to confirm that he has been taking Revlimid and that it's been filled at [facility]. I informed her that [facility] will be contacting them to set up delivery and to NOT take the Revlimid until instructed by the doctor's office. She verbalized understanding of this plan. Patient is now on Dr. Aviva Kluver' dashboard in [facility] and an Rx for Revlimid 2.5 mg Po daily #28 hs been sent to [facility].     Time spent in coordination of care: 30 minutes    [place] Nethra Mehlberg, PharmD, BCPS, BCOP, CPP  Clinical Pharmacist Practitioner, Myeloma and Lymphoma  Pager: [phone]

## 2017-06-30 MED ORDER — LENALIDOMIDE 2.5 MG CAPSULE
ORAL_CAPSULE | Freq: Every day | ORAL | 0 refills | 0 days | Status: CP
Start: 2017-06-30 — End: 2017-07-27

## 2017-06-30 MED ORDER — BACLOFEN 5 MG TABLET
ORAL_TABLET | Freq: Three times a day (TID) | ORAL | 0 refills | 0.00000 days | Status: CP | PRN
Start: 2017-06-30 — End: 2017-07-10

## 2017-06-30 NOTE — Unmapped (Signed)
AOC Triage Note     Patient: DORY VERDUN     Reason for call:  Leg pain and Hiccups    Time call returned: [phone]     Phone Assessment: Spoke with patient wife and she stated that she stopped patients percocet because her husband has had hiccups continuously since 11:00 AM yesterday and now his legs hurt.  Wife also stated her husband has not had a BM since Saturday and she is going to give him some MOM to help his bowels along.       Triage Recommendations: Encourage wife to go ahead with MOM and to try Miralax if the constitpation could be a result of the narcotic pain medication.  Also increase fiber in diet and increase oral fluid intake to 8 glasses of liquids such as juice or water and avoid caffeinated and carbonated liquids. Will consult with team to see what will help alleviate the hiccups but that abruptly stopping his pain medication could cause some withdrawal side effects.  Will call back with the teams recommendation. Message sent to Sherrilyn Rist NP for guidance.     Patient Response: grateful and verbalized understanding.     Outstanding tasks: Call back with response to hiccups.     Patient Pharmacy has been verified and primary pharmacy has been marked as preferred

## 2017-06-30 NOTE — Unmapped (Signed)
AOC Triage Note     Patient: Taylor Ramos     Reason for call:  return patient call    Time call returned: [phone]     Phone Assessment: Spoke with patient wifes     Triage Recommendations: Message returned from Sherrilyn Rist NP and she called in prescription for patient for Baclofen for hiccups.  Informed wife of this and that if patient needs the oxycodone for pain to give to him as prescribed that this probably is not what is causing his hiccups.     Patient Response: grateful and verbalized understanding.     Outstanding tasks: Care team notification no further actions needed      Patient Pharmacy has been verified and primary pharmacy has been marked as preferred

## 2017-06-30 NOTE — Unmapped (Signed)
Pt called and does not feel up to his appt with Dr. Carlene Coria as he is in a lot of pain.  We will coordinate his visits with oncology going forward.

## 2017-06-30 NOTE — Unmapped (Signed)
Hi,    Taylor Ramos, care giver for patient, Taylor Ramos, is requesting to speak to the navigator directly regarding the patient having long bouts of hiccups after taking oxycodne.        Thank you,  Drema Balzarine  [facility]  [phone]

## 2017-07-01 NOTE — Unmapped (Signed)
[facility] Refill Coordination Note  Specialty Medication(s): Prograf 1mg     Taylor Ramos, DOB: 07-Feb-1941  Phone: [phone] (home) , Alternate phone contact: N/A  Phone or address changes today?: No  All above HIPAA information was verified with patient.  Shipping Address: [address]  [place] [address]   Insurance changes? No    Completed refill call assessment today to schedule patient's medication shipment from the [facility] [phone]).      Confirmed the medication and dosage are correct and have not changed: Yes, regimen is correct and unchanged.    Confirmed patient started or stopped the following medications in the past month:  No, there are no changes reported at this time.    Are you tolerating your medication?:  Taylor Ramos reports tolerating the medication.    ADHERENCE    (Below is required for Medicare Part B or Transplant patients only - per drug):   How many tablets were dispensed last month:     Prograf 1 mg   Quantity filled last month: 180   # of tablets left on hand: 10 DAYS    Did you miss any doses in the past 4 weeks? No missed doses reported.    FINANCIAL/SHIPPING    Delivery Scheduled: Yes, Expected medication delivery date: 07/10/2017     Taylor Ramos did not have any additional questions at this time.    Delivery address validated in [facility] scheduling system: Yes, address listed in [facility] is correct.    We will follow up with patient monthly for standard refill processing and delivery.      Thank you,  Tamala Fothergill   [facility] Specialty Technician

## 2017-07-01 NOTE — Unmapped (Signed)
I

## 2017-07-02 NOTE — Unmapped (Signed)
S/O: Mr. Rock is waiting on access to Revlimid as part of his chemotherapy. I spoke with his fiance today and confirmed they received the Revlimid in the mail today. I reviewed that they should NOT start the Revlimid yet, but to bring it with them on his appointment on 07/10/17    A/P: Patient has Revlimid in hand and is waiting on direction from oncology team on when to start. Of note, Rx sent is for a quantity of 28, however patient only needs 21 for his cycle. Patient will need to be educated about this specifically on 07/10/17.    Time spent in coordination of care: 30 minutes    [place] Marrisa Kimber, PharmD, BCPS, BCOP, CPP  Clinical Pharmacist Practitioner, Myeloma and Lymphoma  Pager: [phone]

## 2017-07-03 ENCOUNTER — Other Ambulatory Visit: Payer: Self-pay

## 2017-07-03 ENCOUNTER — Encounter: Payer: Self-pay | Admitting: Emergency Medicine

## 2017-07-03 ENCOUNTER — Inpatient Hospital Stay
Admission: EM | Admit: 2017-07-03 | Discharge: 2017-07-05 | DRG: 640 | Disposition: A | Discharge status: Home Health | Payer: Medicare HMO | Attending: Internal Medicine | Admitting: Internal Medicine

## 2017-07-03 ENCOUNTER — Emergency Department: Payer: Medicare HMO

## 2017-07-03 DIAGNOSIS — I481 Persistent atrial fibrillation: Secondary | ICD-10-CM | POA: Diagnosis present

## 2017-07-03 DIAGNOSIS — D47Z1 Post-transplant lymphoproliferative disorder (PTLD): Secondary | ICD-10-CM | POA: Diagnosis not present

## 2017-07-03 DIAGNOSIS — E875 Hyperkalemia: Secondary | ICD-10-CM | POA: Diagnosis present

## 2017-07-03 DIAGNOSIS — N183 Chronic kidney disease, stage 3 (moderate): Secondary | ICD-10-CM | POA: Diagnosis present

## 2017-07-03 DIAGNOSIS — Z833 Family history of diabetes mellitus: Secondary | ICD-10-CM | POA: Diagnosis not present

## 2017-07-03 DIAGNOSIS — Z9221 Personal history of antineoplastic chemotherapy: Secondary | ICD-10-CM

## 2017-07-03 DIAGNOSIS — K219 Gastro-esophageal reflux disease without esophagitis: Secondary | ICD-10-CM | POA: Diagnosis present

## 2017-07-03 DIAGNOSIS — E785 Hyperlipidemia, unspecified: Secondary | ICD-10-CM | POA: Diagnosis present

## 2017-07-03 DIAGNOSIS — T8690 Unspecified complication of unspecified transplanted organ and tissue: Secondary | ICD-10-CM | POA: Diagnosis not present

## 2017-07-03 DIAGNOSIS — N179 Acute kidney failure, unspecified: Secondary | ICD-10-CM | POA: Diagnosis present

## 2017-07-03 DIAGNOSIS — C7951 Secondary malignant neoplasm of bone: Secondary | ICD-10-CM | POA: Diagnosis present

## 2017-07-03 DIAGNOSIS — I129 Hypertensive chronic kidney disease with stage 1 through stage 4 chronic kidney disease, or unspecified chronic kidney disease: Secondary | ICD-10-CM | POA: Diagnosis present

## 2017-07-03 DIAGNOSIS — E871 Hypo-osmolality and hyponatremia: Principal | ICD-10-CM | POA: Diagnosis present

## 2017-07-03 DIAGNOSIS — Z87891 Personal history of nicotine dependence: Secondary | ICD-10-CM

## 2017-07-03 DIAGNOSIS — R5383 Other fatigue: Secondary | ICD-10-CM | POA: Diagnosis not present

## 2017-07-03 DIAGNOSIS — T391X5A Adverse effect of 4-Aminophenol derivatives, initial encounter: Secondary | ICD-10-CM | POA: Diagnosis present

## 2017-07-03 DIAGNOSIS — C8330 Diffuse large B-cell lymphoma, unspecified site: Secondary | ICD-10-CM | POA: Diagnosis not present

## 2017-07-03 DIAGNOSIS — T402X5A Adverse effect of other opioids, initial encounter: Secondary | ICD-10-CM | POA: Diagnosis present

## 2017-07-03 DIAGNOSIS — N400 Enlarged prostate without lower urinary tract symptoms: Secondary | ICD-10-CM | POA: Diagnosis present

## 2017-07-03 DIAGNOSIS — G92 Toxic encephalopathy: Secondary | ICD-10-CM | POA: Diagnosis present

## 2017-07-03 DIAGNOSIS — Z79899 Other long term (current) drug therapy: Secondary | ICD-10-CM

## 2017-07-03 DIAGNOSIS — Z8249 Family history of ischemic heart disease and other diseases of the circulatory system: Secondary | ICD-10-CM

## 2017-07-03 DIAGNOSIS — R066 Hiccough: Secondary | ICD-10-CM | POA: Diagnosis present

## 2017-07-03 DIAGNOSIS — M109 Gout, unspecified: Secondary | ICD-10-CM | POA: Diagnosis present

## 2017-07-03 DIAGNOSIS — C787 Secondary malignant neoplasm of liver and intrahepatic bile duct: Secondary | ICD-10-CM | POA: Diagnosis present

## 2017-07-03 DIAGNOSIS — Z96659 Presence of unspecified artificial knee joint: Secondary | ICD-10-CM | POA: Diagnosis present

## 2017-07-03 DIAGNOSIS — Z809 Family history of malignant neoplasm, unspecified: Secondary | ICD-10-CM

## 2017-07-03 DIAGNOSIS — I272 Pulmonary hypertension, unspecified: Secondary | ICD-10-CM | POA: Diagnosis present

## 2017-07-03 DIAGNOSIS — E860 Dehydration: Secondary | ICD-10-CM | POA: Diagnosis present

## 2017-07-03 DIAGNOSIS — E872 Acidosis: Secondary | ICD-10-CM | POA: Diagnosis present

## 2017-07-03 DIAGNOSIS — H9190 Unspecified hearing loss, unspecified ear: Secondary | ICD-10-CM | POA: Diagnosis present

## 2017-07-03 DIAGNOSIS — N186 End stage renal disease: Secondary | ICD-10-CM | POA: Diagnosis not present

## 2017-07-03 DIAGNOSIS — D61818 Other pancytopenia: Secondary | ICD-10-CM | POA: Diagnosis present

## 2017-07-03 DIAGNOSIS — Z823 Family history of stroke: Secondary | ICD-10-CM

## 2017-07-03 DIAGNOSIS — D701 Agranulocytosis secondary to cancer chemotherapy: Secondary | ICD-10-CM | POA: Diagnosis not present

## 2017-07-03 DIAGNOSIS — Z940 Kidney transplant status: Secondary | ICD-10-CM

## 2017-07-03 DIAGNOSIS — C8510 Unspecified B-cell lymphoma, unspecified site: Secondary | ICD-10-CM | POA: Diagnosis present

## 2017-07-03 DIAGNOSIS — D696 Thrombocytopenia, unspecified: Secondary | ICD-10-CM | POA: Diagnosis not present

## 2017-07-03 DIAGNOSIS — G9341 Metabolic encephalopathy: Secondary | ICD-10-CM | POA: Diagnosis not present

## 2017-07-03 DIAGNOSIS — Z85118 Personal history of other malignant neoplasm of bronchus and lung: Secondary | ICD-10-CM

## 2017-07-03 DIAGNOSIS — T451X5A Adverse effect of antineoplastic and immunosuppressive drugs, initial encounter: Secondary | ICD-10-CM | POA: Diagnosis not present

## 2017-07-03 LAB — COMPREHENSIVE METABOLIC PANEL
ALK PHOS: 80 U/L (ref 38–126)
ALT: 8 U/L — AB (ref 17–63)
AST: 32 U/L (ref 15–41)
Albumin: 3.7 g/dL (ref 3.5–5.0)
Anion gap: 9 (ref 5–15)
BUN: 25 mg/dL — ABNORMAL HIGH (ref 6–20)
CALCIUM: 10.2 mg/dL (ref 8.9–10.3)
CHLORIDE: 96 mmol/L — AB (ref 101–111)
CO2: 20 mmol/L — ABNORMAL LOW (ref 22–32)
CREATININE: 1.37 mg/dL — AB (ref 0.61–1.24)
GFR calc non Af Amer: 48 mL/min — ABNORMAL LOW (ref >60)
GFR, EST AFRICAN AMERICAN: 56 mL/min — AB (ref >60)
GLUCOSE: 106 mg/dL — AB (ref 65–99)
Potassium: 4.9 mmol/L (ref 3.5–5.1)
SODIUM: 125 mmol/L — AB (ref 135–145)
Total Bilirubin: 1.1 mg/dL (ref 0.3–1.2)
Total Protein: 6.4 g/dL — ABNORMAL LOW (ref 6.5–8.1)

## 2017-07-03 LAB — URINALYSIS, COMPLETE (UACMP) WITH MICROSCOPIC
BILIRUBIN URINE: NEGATIVE
Bacteria, UA: NONE SEEN
Glucose, UA: NEGATIVE mg/dL
Hgb urine dipstick: NEGATIVE
KETONES UR: 5 mg/dL — AB
Leukocytes, UA: NEGATIVE
NITRITE: NEGATIVE
Protein, ur: NEGATIVE mg/dL
SPECIFIC GRAVITY, URINE: 1.011 (ref 1.005–1.030)
pH: 6 (ref 5.0–8.0)

## 2017-07-03 LAB — CBC WITH DIFFERENTIAL/PLATELET
BASOS ABS: 0 10*3/uL (ref 0–0.1)
Basophils Relative: 0 %
EOS ABS: 0 10*3/uL (ref 0–0.7)
EOS PCT: 0 %
HCT: 27.8 % — ABNORMAL LOW (ref 40.0–52.0)
HEMOGLOBIN: 9.4 g/dL — AB (ref 13.0–18.0)
LYMPHS ABS: 0.1 10*3/uL — AB (ref 1.0–3.6)
Lymphocytes Relative: 7 %
MCH: 27.5 pg (ref 26.0–34.0)
MCHC: 33.9 g/dL (ref 32.0–36.0)
MCV: 81.1 fL (ref 80.0–100.0)
Monocytes Absolute: 0.3 10*3/uL (ref 0.2–1.0)
Monocytes Relative: 13 %
NEUTROS PCT: 80 %
Neutro Abs: 1.7 10*3/uL (ref 1.4–6.5)
PLATELETS: 128 10*3/uL — AB (ref 150–440)
RBC: 3.43 MIL/uL — AB (ref 4.40–5.90)
RDW: 16.3 % — ABNORMAL HIGH (ref 11.5–14.5)
WBC: 2.1 10*3/uL — AB (ref 3.8–10.6)

## 2017-07-03 LAB — BASIC METABOLIC PANEL
Anion gap: 9 (ref 5–15)
BUN: 23 mg/dL — ABNORMAL HIGH (ref 6–20)
CHLORIDE: 100 mmol/L — AB (ref 101–111)
CO2: 18 mmol/L — ABNORMAL LOW (ref 22–32)
CREATININE: 1.25 mg/dL — AB (ref 0.61–1.24)
Calcium: 9.4 mg/dL (ref 8.9–10.3)
GFR calc non Af Amer: 54 mL/min — ABNORMAL LOW (ref >60)
Glucose, Bld: 88 mg/dL (ref 65–99)
Potassium: 5 mmol/L (ref 3.5–5.1)
Sodium: 127 mmol/L — ABNORMAL LOW (ref 135–145)

## 2017-07-03 LAB — OSMOLALITY: OSMOLALITY: 272 mosm/kg — AB (ref 275–295)

## 2017-07-03 LAB — SODIUM, URINE, RANDOM: SODIUM UR: 71 mmol/L

## 2017-07-03 MED ORDER — SODIUM CHLORIDE 0.9 % IV SOLN
INTRAVENOUS | Status: DC
  Administered 2017-07-03: 22:00:00 via INTRAVENOUS

## 2017-07-03 MED ORDER — POLYVINYL ALCOHOL 1.4 % OP SOLN
1.0000 [drp] | OPHTHALMIC | Status: DC | PRN
  Filled 2017-07-03: qty 15

## 2017-07-03 MED ORDER — METOPROLOL TARTRATE 25 MG PO TABS
12.5000 mg | ORAL_TABLET | Freq: Two times a day (BID) | ORAL | Status: DC
  Administered 2017-07-03 – 2017-07-05 (×4): 12.5 mg via ORAL
  Filled 2017-07-03 (×4): qty 1

## 2017-07-03 MED ORDER — TACROLIMUS 1 MG PO CAPS
3.0000 mg | ORAL_CAPSULE | Freq: Two times a day (BID) | ORAL | Status: DC
  Administered 2017-07-03 – 2017-07-05 (×4): 3 mg via ORAL
  Filled 2017-07-03 (×5): qty 3

## 2017-07-03 MED ORDER — ENOXAPARIN SODIUM 40 MG/0.4ML ~~LOC~~ SOLN
40.0000 mg | SUBCUTANEOUS | Status: DC
  Filled 2017-07-03: qty 0.4

## 2017-07-03 MED ORDER — SODIUM CHLORIDE 0.9 % IV BOLUS (SEPSIS)
1000.0000 mL | Freq: Once | INTRAVENOUS | Status: AC
  Administered 2017-07-03: 1000 mL via INTRAVENOUS

## 2017-07-03 MED ORDER — TRAMADOL HCL 50 MG PO TABS
50.0000 mg | ORAL_TABLET | Freq: Once | ORAL | Status: AC
  Administered 2017-07-03: 50 mg via ORAL
  Filled 2017-07-03: qty 1

## 2017-07-03 MED ORDER — MEGESTROL ACETATE 400 MG/10ML PO SUSP
200.0000 mg | Freq: Every day | ORAL | Status: DC
  Administered 2017-07-04: 200 mg via ORAL
  Filled 2017-07-03 (×2): qty 5

## 2017-07-03 MED ORDER — ALLOPURINOL 100 MG PO TABS
200.0000 mg | ORAL_TABLET | Freq: Every day | ORAL | Status: DC
  Administered 2017-07-04 – 2017-07-05 (×2): 200 mg via ORAL
  Filled 2017-07-03 (×2): qty 2

## 2017-07-03 MED ORDER — CHLORPROMAZINE HCL 25 MG PO TABS
25.0000 mg | ORAL_TABLET | Freq: Once | ORAL | Status: AC
Start: 2017-07-03 — End: 2017-07-03
  Administered 2017-07-03: 25 mg via ORAL
  Filled 2017-07-03 (×2): qty 1

## 2017-07-03 MED ORDER — ADULT MULTIVITAMIN W/MINERALS CH
1.0000 | ORAL_TABLET | Freq: Every day | ORAL | Status: DC
Start: 2017-07-04 — End: 2017-07-05
  Administered 2017-07-04 – 2017-07-05 (×2): 1 via ORAL
  Filled 2017-07-03 (×3): qty 1

## 2017-07-03 MED ORDER — ACETAMINOPHEN 325 MG PO TABS
650.0000 mg | ORAL_TABLET | Freq: Four times a day (QID) | ORAL | Status: DC | PRN
  Administered 2017-07-04 – 2017-07-05 (×2): 650 mg via ORAL
  Filled 2017-07-03 (×2): qty 2

## 2017-07-03 MED ORDER — FINASTERIDE 5 MG PO TABS
5.0000 mg | ORAL_TABLET | Freq: Every day | ORAL | Status: DC
  Administered 2017-07-04 – 2017-07-05 (×2): 5 mg via ORAL
  Filled 2017-07-03 (×2): qty 1

## 2017-07-03 MED ORDER — ONDANSETRON HCL 4 MG PO TABS: 4.0000 mg | ORAL_TABLET | Freq: Four times a day (QID) | ORAL | Status: DC | PRN

## 2017-07-03 MED ORDER — ALBUTEROL SULFATE (2.5 MG/3ML) 0.083% IN NEBU: 2.5000 mg | INHALATION_SOLUTION | RESPIRATORY_TRACT | Status: DC | PRN

## 2017-07-03 MED ORDER — PREDNISONE 10 MG PO TABS
5.0000 mg | ORAL_TABLET | Freq: Every day | ORAL | Status: DC
  Administered 2017-07-04 – 2017-07-05 (×2): 5 mg via ORAL
  Filled 2017-07-03 (×2): qty 0.5

## 2017-07-03 MED ORDER — PANTOPRAZOLE SODIUM 40 MG PO TBEC
40.0000 mg | DELAYED_RELEASE_TABLET | Freq: Two times a day (BID) | ORAL | Status: DC
  Administered 2017-07-03 – 2017-07-05 (×4): 40 mg via ORAL
  Filled 2017-07-03 (×4): qty 1

## 2017-07-03 MED ORDER — TAMSULOSIN HCL 0.4 MG PO CAPS
0.4000 mg | ORAL_CAPSULE | Freq: Every day | ORAL | Status: DC
  Administered 2017-07-03 – 2017-07-05 (×3): 0.4 mg via ORAL
  Filled 2017-07-03 (×3): qty 1

## 2017-07-03 MED ORDER — ACETAMINOPHEN 650 MG RE SUPP: 650.0000 mg | Freq: Four times a day (QID) | RECTAL | Status: DC | PRN

## 2017-07-03 MED ORDER — COLCHICINE 0.6 MG PO TABS: 0.6000 mg | ORAL_TABLET | Freq: Two times a day (BID) | ORAL | Status: DC | PRN

## 2017-07-03 MED ORDER — SUCRALFATE 1 G PO TABS: 1.0000 g | ORAL_TABLET | Freq: Four times a day (QID) | ORAL | Status: DC | PRN

## 2017-07-03 NOTE — ED Provider Notes (Signed)
[facility] Emergency Department Provider Note    None    (approximate)  I have reviewed the triage vital signs and the nursing notes.   HISTORY  Chief Complaint Hiccups    HPI Matthew Brown is a 77 y.o. male who is very pleasant and in no acute distress presents for evaluation of several weeks of persistent left leg pain as well as right shoulder pain.  Patient is being managed for cancer and lymphoma.  Was given prescription for Percocet but patient states he is unable to tolerate this as he develops hiccups as well as hallucinations and anxiety when taking this medication.  He was told to stop this by his PCP and told to try Tylenol and Aleve but has not tried this and is having persistent pain so he presented to the ER for further evaluation.  He denies any fevers.  No cough or shortness of breath.  No nausea or vomiting.  No numbness or tingling.  No headaches.  Denies any hiccups right now.  The pain is achy, persistent and moderate in severity.  Past Medical History:  Diagnosis Date  . Benign prostatic hypertrophy   . Chronic headache 10/19/2015  . Dysrhythmia   . ED (erectile dysfunction)   . End stage renal disease ([place])   . Essential hypertension   . GERD (gastroesophageal reflux disease)   . GIB (gastrointestinal bleeding)    a. 01/5630 s/p R colic artery embolization;  b. 02/2015 EGD: duod ulcerative mass->Bx notable for coagulative necrosis - ? ischemia vs thrombosis-->coumadin d/c'd.  . Gout   . Hearing loss   . Hemorrhoids   . Hyperlipidemia   . Lymphoma ([place])   . Lymphoma ([place]) 2017  . Multiple thyroid nodules 06/06/2016   Noted on carotid US; dedicated US to be ordered by staff  . Osteoarthrosis, unspecified whether generalized or localized, lower leg   . Persistent atrial fibrillation ([place])    a. CHA2DS2VASc = 3-->coumadin d/c'd 02/2015 2/2 recurrent GIB.  [name] Pneumonia    2016  . Prostatitis   . Pulmonary hypertension ([place])    a.  10/2014 Echo: EF 60-65%, mild to mod MR, mildly dil LA, nl RV, PASP 32mmHg.  [name] Renal transplant recipient   . Ulcers of both great toes (HCC)    Family History  Problem Relation Age of Onset  . Cancer Mother        throat  . Diabetes Brother   . Heart disease Brother   . Stroke Brother   . Hypertension Brother   . Diabetes Sister   . Heart disease Sister   . Hypertension Sister   . Diabetes Sister   . Diabetes Brother   . COPD Neg Hx   . Kidney disease Neg Hx   . Prostate cancer Neg Hx   . Kidney cancer Neg Hx   . Bladder Cancer Neg Hx    Past Surgical History:  Procedure Laterality Date  . AV FISTULA PLACEMENT  1998  . BACK SURGERY    . ESOPHAGOGASTRODUODENOSCOPY  03/13/15   severe esophagitis, ulcerated mass  . ESOPHAGOGASTRODUODENOSCOPY (EGD) WITH PROPOFOL N/A 07/09/2016   Procedure: ESOPHAGOGASTRODUODENOSCOPY (EGD) WITH PROPOFOL;  Surgeon: [name], MD;  Location: [facility] ENDOSCOPY;  Service: Endoscopy;  Laterality: N/A;  . HERNIA REPAIR  1974  . KIDNEY TRANSPLANT  2006  . LIGATION OF ARTERIOVENOUS  FISTULA Right 06/10/2017   Procedure: LIGATION OF ARTERIOVENOUS  FISTULA;  Surgeon: Algernon Huxley, MD;  Location: [facility] ORS;  Service: Vascular;  Laterality: Right;  . PERIPHERAL VASCULAR CATHETERIZATION N/A 05/07/2016   Procedure: Glori Luis Cath Insertion;  Surgeon: Algernon Huxley, MD;  Location: [place] [facility];  Service: Cardiovascular;  Laterality: N/A;  . PROSTATE ABLATION    . STOMACH SURGERY     blood vessel burst  . THROAT SURGERY    . TOTAL KNEE ARTHROPLASTY     Patient Active Problem List   Diagnosis Date Noted  . Complication of vascular access for dialysis 04/07/2017  . Spermatic cord inflammation 02/24/2017  . Thrombocytopenia ([place]) 02/14/2017  . Chemotherapy-induced neutropenia ([place]) 02/14/2017  . Malignant neoplasm metastatic to lung ([place]) 02/03/2017  . 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 Diffuse large B-cell lymphoma of extranodal site excluding spleen  and other solid organs ([place]) 01/30/2017  . Encounter for antineoplastic immunotherapy 09/03/2016  . Encounter for anticoagulation discussion and counseling 08/31/2016  . Acute upper GI bleed   . Cancer ([place])   . Palliative care by specialist   . DNR (do not resuscitate) discussion   . Goals of care, counseling/discussion   . GIB (gastrointestinal bleeding) 07/08/2016  . Encounter for antineoplastic chemotherapy 07/07/2016  . Gastroenteritis 06/25/2016  . Multiple thyroid nodules 06/06/2016  . Liver metastases ([place]) 05/26/2016  . Bone metastasis ([place]) 05/26/2016  . Non-intractable vomiting with nausea   . Diarrhea   . Neutropenic fever ([place]) 05/18/2016  . Carotid atherosclerosis, bilateral 05/09/2016  . Protein-calorie malnutrition, severe 05/08/2016  . Hypercalcemia 05/06/2016  . Renal transplant recipient 05/06/2016  . Post-transplant lymphoproliferative disorder ([place]) 05/06/2016  . Tumor lysis syndrome 05/06/2016  . High serum parathyroid hormone (PTH) 05/03/2016  . Pain in joint involving pelvic region and thigh 05/03/2016  . Diffuse large B cell lymphoma ([place]) 04/30/2016  . Abnormal positron emission tomography (PET) scan   . Anemia 04/29/2016  . Lymphoma ([place]) 04/28/2016  . Pneumothorax 04/28/2016  . Hyponatremia 04/28/2016  . Adenopathy 04/15/2016  . Bone disease 04/15/2016  . Weight loss 04/15/2016  . Spinal stenosis of cervical region 11/21/2015  . Pleural effusion, right 11/21/2015  . Right inguinal pain 10/19/2015  . Chronic headache 10/19/2015  . Hip strain 10/09/2015  . BPH with obstruction/lower urinary tract symptoms 09/03/2015  . Prostatitis, chronic 09/03/2015  . Erectile dysfunction of organic origin 09/03/2015  . Difficulty urinating 08/14/2015  . Accumulation of fluid in tissues 07/18/2015  . Persistent atrial fibrillation ([place])   . Pain in finger of right hand 06/15/2015  . Eustachian tube dysfunction, left 06/06/2015  . Chronic insomnia 06/06/2015    . H/O: upper GI bleed 06/06/2015  . Pleural cavity effusion 04/07/2015  . Abdominal pain, generalized 04/04/2015  . Pneumonia due to Haemophilus influenzae ([place]) 03/16/2015  . Encounter for therapeutic drug monitoring 07/27/2013  . Long term (current) use of anticoagulants 09/25/2010  . Hyperlipidemia 10/25/2008  . Essential hypertension 10/25/2008  . ATRIAL FIBRILLATION 10/25/2008  . GERD 10/25/2008  . RENAL FAILURE, END STAGE 10/25/2008  . Osteoarthrosis, unspecified whether generalized or localized, involving lower leg 10/25/2008  . BENIGN PROSTATIC HYPERTROPHY, HX OF 10/25/2008      Prior to Admission medications   Medication Sig Start Date End Date Taking? Authorizing Provider  acetaminophen (TYLENOL) 325 MG tablet Take 2 tablets (650 mg total) by mouth Brown 6 (six) hours as needed for mild pain (or Fever >/= 101). 05/13/16  Yes Gouru, Aruna, MD  albuterol (PROAIR HFA) 108 (90 BASE) MCG/ACT inhaler Inhale 1-2 puffs into the lungs Brown 4 (four) hours as  needed for wheezing or shortness of breath.  02/20/14  Yes [provider]  allopurinol (ZYLOPRIM) 100 MG tablet Take 2 tablets (200 mg total) by mouth daily. 03/20/17  Yes Karen Kitchens, NP  COLCRYS 0.6 MG tablet Take 1 tablet by mouth 2 (two) times daily as needed (gout).  05/03/16  Yes [provider]  finasteride (PROSCAR) 5 MG tablet Take 1 tablet (5 mg total) by mouth daily. 09/02/16  Yes McGowan, Larene Beach A, PA-C  hydrochlorothiazide (HYDRODIURIL) 25 MG tablet Take 25 mg by mouth daily.   Yes [provider]  hydroxypropyl methylcellulose (ISOPTO TEARS) 2.5 % ophthalmic solution Place 1 drop into both eyes as needed (dry eyes).    Yes [provider]  lenalidomide (REVLIMID) 5 MG capsule Take 1 capsule (5 mg total) daily by mouth. for 21 days then off 7 days. Do NOT start until instructed by MD 05/07/17  Yes Lequita Asal, MD  megestrol (MEGACE) 400 MG/10ML suspension Take 5 mLs (200 mg  total) by mouth daily. 03/12/17  Yes Karen Kitchens, NP  metoprolol tartrate (LOPRESSOR) 25 MG tablet Take 12.5 mg by mouth 2 (two) times daily.  11/04/16 11/04/17 Yes [provider]  Multiple Vitamin (MULTIVITAMIN) tablet Take 1 tablet by mouth daily.     Yes [provider]  ondansetron (ZOFRAN) 4 MG tablet Take 1 tablet (4 mg total) by mouth Brown 6 (six) hours as needed for nausea. 05/22/16  Yes Vaughan Basta, MD  oxybutynin (DITROPAN-XL) 5 MG 24 hr tablet Take 1 tablet (5 mg total) by mouth daily. 09/02/16  Yes McGowan, Larene Beach A, PA-C  pantoprazole (PROTONIX) 40 MG tablet Take 1 tablet (40 mg total) by mouth 2 (two) times daily. 06/02/17  Yes Corcoran, Drue Second, MD  predniSONE (DELTASONE) 5 MG tablet Take 5 mg by mouth daily.  11/23/13  Yes [provider]  simethicone (MYLICON) 80 MG chewable tablet Chew 1 tablet (80 mg total) by mouth Brown 6 (six) hours as needed for flatulence. 06/28/16  Yes Wieting, Richard, MD  sucralfate (CARAFATE) 1 g tablet Take 1 tablet (1 g total) by mouth 4 (four) times daily. Resume taking after one week- once finished taking oral levaquine. ( to avoid interaction.) Patient taking differently: Take 1 g by mouth 4 (four) times daily as needed. Resume taking after one week- once finished taking oral levaquine. ( to avoid interaction.) 05/22/16  Yes Vaughan Basta, MD  tacrolimus (PROGRAF) 1 MG capsule Take 3 mg by mouth 2 (two) times daily. Reported on 08/16/2015   Yes [provider]  tamsulosin (FLOMAX) 0.4 MG CAPS capsule Take 1 capsule (0.4 mg total) by mouth daily. 09/02/16  Yes McGowan, Hunt Oris, PA-C    Allergies Oxycodone-acetaminophen    Social History Social History   Tobacco Use  . Smoking status: Former Smoker    Packs/day: 1.00    Years: 25.00    Pack years: 25.00    Types: Cigarettes    Last attempt to quit: 06/23/1978    Years since quitting: 39.0  . Smokeless tobacco: Never Used  Substance Use  Topics  . Alcohol use: No  . Drug use: No    Review of Systems Patient denies headaches, rhinorrhea, blurry vision, numbness, shortness of breath, chest pain, edema, cough, abdominal pain, nausea, vomiting, diarrhea, dysuria, fevers, rashes or hallucinations unless otherwise stated above in HPI. ____________________________________________   PHYSICAL EXAM:  VITAL SIGNS: Vitals:   07/03/17 2030 07/03/17 2133  BP: (!) 164/57 [name])  190/75  Pulse:  (!) 101  Resp: 12 16  Temp:  (!) 97.5 F (36.4 C)  SpO2:  100%    Constitutional: Alert and oriented in no acute distress. Eyes: Conjunctivae are normal.  Head: Atraumatic. Nose: No congestion/rhinnorhea. Mouth/Throat: Mucous membranes are moist.   Neck: No stridor. Painless ROM.  Cardiovascular: Normal rate, regular rhythm. Grossly normal heart sounds.  Good peripheral circulation. Respiratory: Normal respiratory effort.  No retractions. Lungs CTAB. Gastrointestinal: Soft and nontender. No distention. No abdominal bruits. No CVA tenderness. Genitourinary:  Musculoskeletal: No lower extremity tenderness nor edema.  No joint effusions. Neurologic:  Normal speech and language. No gross focal neurologic deficits are appreciated. No facial droop Skin:  Skin is warm, dry and intact. No rash noted. Psychiatric: Mood and affect are normal. Speech and behavior are normal.  ____________________________________________   LABS (all labs ordered are listed, but only abnormal results are displayed)  Results for orders placed or performed during the hospital encounter of 07/03/17 (from the past 24 hour(s))  CBC with Differential/Platelet     Status: Abnormal   Collection Time: 07/03/17  3:08 PM  Result Value Ref Range   WBC 2.1 (L) 3.8 - 10.6 K/uL   RBC 3.43 (L) 4.40 - 5.90 MIL/uL   Hemoglobin 9.4 (L) 13.0 - 18.0 g/dL   HCT 27.8 (L) 40.0 - 52.0 %   MCV 81.1 80.0 - 100.0 fL   MCH 27.5 26.0 - 34.0 pg   MCHC 33.9 32.0 - 36.0 g/dL   RDW 16.3  (H) 11.5 - 14.5 %   Platelets 128 (L) 150 - 440 K/uL   Neutrophils Relative % 80 %   Neutro Abs 1.7 1.4 - 6.5 K/uL   Lymphocytes Relative 7 %   Lymphs Abs 0.1 (L) 1.0 - 3.6 K/uL   Monocytes Relative 13 %   Monocytes Absolute 0.3 0.2 - 1.0 K/uL   Eosinophils Relative 0 %   Eosinophils Absolute 0.0 0 - 0.7 K/uL   Basophils Relative 0 %   Basophils Absolute 0.0 0 - 0.1 K/uL  Comprehensive metabolic panel     Status: Abnormal   Collection Time: 07/03/17  3:08 PM  Result Value Ref Range   Sodium 125 (L) 135 - 145 mmol/L   Potassium 4.9 3.5 - 5.1 mmol/L   Chloride 96 (L) 101 - 111 mmol/L   CO2 20 (L) 22 - 32 mmol/L   Glucose, Bld 106 (H) 65 - 99 mg/dL   BUN 25 (H) 6 - 20 mg/dL   Creatinine, Ser 1.37 (H) 0.61 - 1.24 mg/dL   Calcium 10.2 8.9 - 10.3 mg/dL   Total Protein 6.4 (L) 6.5 - 8.1 g/dL   Albumin 3.7 3.5 - 5.0 g/dL   AST 32 15 - 41 U/L   ALT 8 (L) 17 - 63 U/L   Alkaline Phosphatase 80 38 - 126 U/L   Total Bilirubin 1.1 0.3 - 1.2 mg/dL   GFR calc non Af Amer 48 (L) >60 mL/min   GFR calc Af Amer 56 (L) >60 mL/min   Anion gap 9 5 - 15  Basic metabolic panel     Status: Abnormal   Collection Time: 07/03/17  6:19 PM  Result Value Ref Range   Sodium 127 (L) 135 - 145 mmol/L   Potassium 5.0 3.5 - 5.1 mmol/L   Chloride 100 (L) 101 - 111 mmol/L   CO2 18 (L) 22 - 32 mmol/L   Glucose, Bld 88 65 - 99 mg/dL  BUN 23 (H) 6 - 20 mg/dL   Creatinine, Ser 1.25 (H) 0.61 - 1.24 mg/dL   Calcium 9.4 8.9 - 10.3 mg/dL   GFR calc non Af Amer 54 (L) >60 mL/min   GFR calc Af Amer >60 >60 mL/min   Anion gap 9 5 - 15  Urinalysis, Complete w Microscopic     Status: Abnormal   Collection Time: 07/03/17  6:58 PM  Result Value Ref Range   Color, Urine YELLOW (A) YELLOW   APPearance CLEAR (A) CLEAR   Specific Gravity, Urine 1.011 1.005 - 1.030   pH 6.0 5.0 - 8.0   Glucose, UA NEGATIVE NEGATIVE mg/dL   Hgb urine dipstick NEGATIVE NEGATIVE   Bilirubin Urine NEGATIVE NEGATIVE   Ketones, ur 5 (A)  NEGATIVE mg/dL   Protein, ur NEGATIVE NEGATIVE mg/dL   Nitrite NEGATIVE NEGATIVE   Leukocytes, UA NEGATIVE NEGATIVE   RBC / HPF 0-5 0 - 5 RBC/hpf   WBC, UA 0-5 0 - 5 WBC/hpf   Bacteria, UA NONE SEEN NONE SEEN   Squamous Epithelial / LPF 0-5 (A) NONE SEEN  Sodium, urine, random     Status: None   Collection Time: 07/03/17  6:58 PM  Result Value Ref Range   Sodium, Ur 71 mmol/L  Osmolality     Status: Abnormal   Collection Time: 07/03/17  8:34 PM  Result Value Ref Range   Osmolality 272 (L) 275 - 295 mOsm/kg   ____________________________________________ ____________________________________________  RADIOLOGY  I personally reviewed all radiographic images ordered to evaluate for the above acute complaints and reviewed radiology reports and findings.  These findings were personally discussed with the patient.  Please see medical record for radiology report.  ____________________________________________   PROCEDURES  Procedure(s) performed:  Procedures    Critical Care performed: no ____________________________________________   INITIAL IMPRESSION / ASSESSMENT AND PLAN / ED COURSE  Pertinent labs & imaging results that were available during my care of the patient were reviewed by me and considered in my medical decision making (see chart for details).  DDX: Hyponatremia, mass, pneumonia, dehydration, failure to thrive, fracture, lytic lesion, UTI,[place]  KINSEY COWSERT is a 77 y.o. who presents to the ED with the above symptoms.  Patient in no acute distress.  Patient is AFVSS in ED. Exam as above. Given current presentation have considered the above differential.Blood work and radiographs will be ordered for the above differential.  Clinical Course as of Jul 03 2246  Fri Jul 03, 2017  1603 X-rays are negative for acute fracture.  Does have significant arthritis of the left hip lateral hips quite frankly but no evidence of fracture.  Chest x-ray shows bilateral infiltrate  but more likely secondary to chronic lung disease as the patient has no hypoxia no cough or respiratory symptoms I do not believe this is infectious in nature.  Patient is now complaining of hiccups again will give dose of Thorazine.  [PR]  1809 Patient states that his pain is improved with Ultram and hiccups have gotten better after taking the Thorazine.  [PR]  1915 Have added on urinalysis as well as urine sodium to evaluate for SIADH.  Sodium has appropriately increased to 127 after just 1 L of IV fluids.  BUN and creatinine are also improving.  [PR]  2012 Patient's urine sodium is elevated to 71 which would be an appropriate given his hyponatremia.  I am concern for [facility] given his presentation I do feel the patient would benefit from further observation  in the hospital for IV fluid and serial BMPs.  Have discussed with the patient and available family all diagnostics and treatments performed thus far and all questions were answered to the best of my ability. The patient demonstrates understanding and agreement with plan.   [PR]    Clinical Course User Index [PR] Merlyn Lot, MD     ____________________________________________   FINAL CLINICAL IMPRESSION(S) / ED DIAGNOSES  Final diagnoses:  Hyponatremia  Hiccups      NEW MEDICATIONS STARTED DURING THIS VISIT:  Current Discharge Medication List       Note:  This document was prepared using Dragon voice recognition software and may include unintentional dictation errors.    Merlyn Lot, MD 07/03/17 2258

## 2017-07-03 NOTE — H&P (Signed)
[facility] [place] NAME: Matthew Brown    MR#:  [id number]  DATE OF BIRTH:  1940-12-04  DATE OF ADMISSION:  07/03/2017  PRIMARY CARE PHYSICIAN: Arnetha Courser, MD   REQUESTING/REFERRING PHYSICIAN: Dr Merlyn Lot  CHIEF COMPLAINT:   Chief Complaint  Patient presents with  . Hiccups    HISTORY OF PRESENT ILLNESS:  Matthew Brown  is a 77 y.o. male with a known history of lymphoma, hypertension, atrial fibrillation and kidney transplant.  He presents with hallucinations with oxycodone and baclofen.  He is also having hiccups and pain in his legs.  In the ER, he was given tramadol and Thorazine for hiccups and pain.  The patient is currently sleeping and unable to wake up.  The patient's family is at the bedside and able to give history.  The patient has not been eating very well.  His appetite is been very poor and is eating only one time a day.  ER physician was concerned about his sodium being low at 125.  A liter of IV fluids was given and it only came up to 127.  Hospitalist services were contacted for further evaluation.  PAST MEDICAL HISTORY:   Past Medical History:  Diagnosis Date  . Benign prostatic hypertrophy   . Chronic headache 10/19/2015  . Dysrhythmia   . ED (erectile dysfunction)   . End stage renal disease ([place])   . Essential hypertension   . GERD (gastroesophageal reflux disease)   . GIB (gastrointestinal bleeding)    a. 08/4191 s/p R colic artery embolization;  b. 02/2015 EGD: duod ulcerative mass->Bx notable for coagulative necrosis - ? ischemia vs thrombosis-->coumadin d/c'd.  . Gout   . Hearing loss   . Hemorrhoids   . Hyperlipidemia   . Lymphoma ([place])   . Lymphoma ([place]) 2017  . Multiple thyroid nodules 06/06/2016   Noted on carotid US; dedicated US to be ordered by staff  . Osteoarthrosis, unspecified whether generalized or localized, lower leg   . Persistent atrial fibrillation ([place])    a. CHA2DS2VASc =  3-->coumadin d/c'd 02/2015 2/2 recurrent GIB.  [name] Pneumonia    2016  . Prostatitis   . Pulmonary hypertension ([place])    a. 10/2014 Echo: EF 60-65%, mild to mod MR, mildly dil LA, nl RV, PASP 40mmHg.  [name] Renal transplant recipient   . Ulcers of both great toes ([place])     PAST SURGICAL HISTORY:   Past Surgical History:  Procedure Laterality Date  . AV FISTULA PLACEMENT  1998  . BACK SURGERY    . ESOPHAGOGASTRODUODENOSCOPY  03/13/15   severe esophagitis, ulcerated mass  . ESOPHAGOGASTRODUODENOSCOPY (EGD) WITH PROPOFOL N/A 07/09/2016   Procedure: ESOPHAGOGASTRODUODENOSCOPY (EGD) WITH PROPOFOL;  Surgeon: [name], MD;  Location: [facility] ENDOSCOPY;  Service: Endoscopy;  Laterality: N/A;  . HERNIA REPAIR  1974  . KIDNEY TRANSPLANT  2006  . LIGATION OF ARTERIOVENOUS  FISTULA Right 06/10/2017   Procedure: LIGATION OF ARTERIOVENOUS  FISTULA;  Surgeon: Algernon Huxley, MD;  Location: [facility] ORS;  Service: Vascular;  Laterality: Right;  . PERIPHERAL VASCULAR CATHETERIZATION N/A 05/07/2016   Procedure: Glori Luis Cath Insertion;  Surgeon: Algernon Huxley, MD;  Location: [facility];  Service: Cardiovascular;  Laterality: N/A;  . PROSTATE ABLATION    . STOMACH SURGERY     blood vessel burst  . THROAT SURGERY    . TOTAL KNEE ARTHROPLASTY      SOCIAL HISTORY:   Social  History   Tobacco Use  . Smoking status: Former Smoker    Packs/day: 1.00    Years: 25.00    Pack years: 25.00    Types: Cigarettes    Last attempt to quit: 06/23/1978    Years since quitting: 39.0  . Smokeless tobacco: Never Used  Substance Use Topics  . Alcohol use: No    FAMILY HISTORY:   Family History  Problem Relation Age of Onset  . Cancer Mother        throat  . Diabetes Brother   . Heart disease Brother   . Stroke Brother   . Hypertension Brother   . Diabetes Sister   . Heart disease Sister   . Hypertension Sister   . Diabetes Sister   . Diabetes Brother   . COPD Neg Hx   . Kidney disease Neg Hx   . Prostate  cancer Neg Hx   . Kidney cancer Neg Hx   . Bladder Cancer Neg Hx     DRUG ALLERGIES:  No Known Allergies  REVIEW OF SYSTEMS:  Patient unable to provide review of systems at this time secondary to being medicated  MEDICATIONS AT HOME:   Prior to Admission medications   Medication Sig Start Date End Date Taking? Authorizing Provider  acetaminophen (TYLENOL) 325 MG tablet Take 2 tablets (650 mg total) by mouth every 6 (six) hours as needed for mild pain (or Fever >/= 101). 05/13/16   Gouru, Illene Silver, MD  albuterol (PROAIR HFA) 108 (90 BASE) MCG/ACT inhaler Inhale 1-2 puffs into the lungs every 4 (four) hours as needed.  02/20/14   [provider]  allopurinol (ZYLOPRIM) 100 MG tablet Take 2 tablets (200 mg total) by mouth daily. 03/20/17   Karen Kitchens, NP  COLCRYS 0.6 MG tablet Take 1 tablet by mouth 2 (two) times daily as needed (gout).  05/03/16   [provider]  finasteride (PROSCAR) 5 MG tablet Take 1 tablet (5 mg total) by mouth daily. 09/02/16   Zara Council A, PA-C  furosemide (LASIX) 20 MG tablet Take 20 mg by mouth every other day.  07/22/16 07/22/17  [provider]  hydrochlorothiazide (HYDRODIURIL) 25 MG tablet Take 25 mg by mouth daily.    [provider]  HYDROcodone-acetaminophen (NORCO) 5-325 MG tablet Take 1 tablet by mouth every 6 (six) hours as needed for moderate pain. 06/10/17   Algernon Huxley, MD  hydroxypropyl methylcellulose (ISOPTO TEARS) 2.5 % ophthalmic solution Place 1 drop into both eyes as needed.     [provider]  lenalidomide (REVLIMID) 5 MG capsule Take 1 capsule (5 mg total) daily by mouth. for 21 days then off 7 days. Do NOT start until instructed by MD Patient not taking: Reported on 06/09/2017 05/07/17   Lequita Asal, MD  megestrol (MEGACE) 400 MG/10ML suspension Take 5 mLs (200 mg total) by mouth daily. 03/12/17   Karen Kitchens, NP  metoprolol tartrate (LOPRESSOR) 25 MG tablet Take 12.5 mg by mouth 2  (two) times daily.  11/04/16 11/04/17  [provider]  Multiple Vitamin (MULTIVITAMIN) tablet Take 1 tablet by mouth daily.      [provider]  ondansetron (ZOFRAN) 4 MG tablet Take 1 tablet (4 mg total) by mouth every 6 (six) hours as needed for nausea. Patient not taking: Reported on 06/09/2017 05/22/16   Vaughan Basta, MD  oxybutynin (DITROPAN-XL) 5 MG 24 hr tablet Take 1 tablet (5 mg total) by mouth daily. 09/02/16  Zara Council A, PA-C  oxyCODONE-acetaminophen (ROXICET) 5-325 MG tablet Take 1 tablet by mouth every 6 (six) hours as needed. 05/29/17   Lequita Asal, MD  pantoprazole (PROTONIX) 40 MG tablet Take 1 tablet (40 mg total) by mouth 2 (two) times daily. Patient taking differently: Take 40 mg by mouth 2 (two) times daily as needed.  06/02/17   Lequita Asal, MD  predniSONE (DELTASONE) 5 MG tablet Take 5 mg by mouth daily.  11/23/13   [provider]  simethicone (MYLICON) 80 MG chewable tablet Chew 1 tablet (80 mg total) by mouth every 6 (six) hours as needed for flatulence. 06/28/16   Loletha Grayer, MD  sucralfate (CARAFATE) 1 g tablet Take 1 tablet (1 g total) by mouth 4 (four) times daily. Resume taking after one week- once finished taking oral levaquine. ( to avoid interaction.) Patient taking differently: Take 1 g by mouth 4 (four) times daily as needed. Resume taking after one week- once finished taking oral levaquine. ( to avoid interaction.) 05/22/16   Vaughan Basta, MD  tacrolimus (PROGRAF) 1 MG capsule Take 3 mg by mouth 2 (two) times daily. Reported on 08/16/2015    [provider]  tamsulosin (FLOMAX) 0.4 MG CAPS capsule Take 1 capsule (0.4 mg total) by mouth daily. 09/02/16   McGowan, Larene Beach A, PA-C      VITAL SIGNS:  Blood pressure (!) 164/57, pulse 71, temperature 97.8 F (36.6 C), temperature source Oral, resp. rate 12, height 5\' 5"  (1.651 m), weight 52.6 kg (116 lb), SpO2 100 %.  PHYSICAL  EXAMINATION:  GENERAL:  77 y.o.-year-old patient lying in the bed with no acute distress.  EYES: Pupils equal, round, reactive to light and accommodation. No scleral icterus.  HEENT: Head atraumatic, normocephalic. Oropharynx and nasopharynx clear.  NECK:  Supple, no jugular venous distention. No thyroid enlargement, no tenderness.  LUNGS: Normal breath sounds bilaterally, no wheezing, rales,rhonchi or crepitation. No use of accessory muscles of respiration.  CARDIOVASCULAR: S1, S2 irregularly regular. No murmurs, rubs, or gallops.  ABDOMEN: Soft, nontender, nondistended. Bowel sounds present. No organomegaly or mass.  EXTREMITIES: No pedal edema, cyanosis, or clubbing.  NEUROLOGIC: Unable to assess at this time secondary to altered mental status with medication PSYCHIATRIC: Unable to assess at this time secondary to altered mental status with medication SKIN: No rash, lesion, or ulcer.   LABORATORY PANEL:   CBC Recent Labs  Lab 07/03/17 1508  WBC 2.1*  HGB 9.4*  HCT 27.8*  PLT 128*   ------------------------------------------------------------------------------------------------------------------  Chemistries  Recent Labs  Lab 07/03/17 1508 07/03/17 1819  NA 125* 127*  K 4.9 5.0  CL 96* 100*  CO2 20* 18*  GLUCOSE 106* 88  BUN 25* 23*  CREATININE 1.37* 1.25*  CALCIUM 10.2 9.4  AST 32  --   ALT 8*  --   ALKPHOS 80  --   BILITOT 1.1  --    ------------------------------------------------------------------------------------------------------------------   RADIOLOGY:  Dg Chest 2 View  Result Date: 07/03/2017 CLINICAL DATA:  Back pain and hiccups. EXAM: CHEST  2 VIEW COMPARISON:  07/08/2016 and PET-CT from 01/14/2017 FINDINGS: Right chest wall port a catheter is identified with tip in the cavoatrial junction. There is mild cardiac enlargement and aortic atherosclerosis. Diffuse bilateral hazy lung opacities are identified. No lobar consolidation. Left lung nodule is again  noted measuring 1.3 cm. IMPRESSION: 1. Diffuse bilateral hazy lung opacities compatible with pulmonary edema versus atypical infection/inflammation. Electronically Signed   By: Queen Slough.D.  On: 07/03/2017 15:57   Dg Lumbar Spine Complete  Result Date: 07/03/2017 CLINICAL DATA:  Low back pain, left hip and knee pain. EXAM: LUMBAR SPINE - COMPLETE 4+ VIEW COMPARISON:  CT 03/08/2017 FINDINGS: Mild thoracolumbar curvature convex to the left. Previous fusion from L3 to the sacrum appears solid. Posterior hardware intact. Degenerative disc disease in the lower thoracic region and more pronounced at L1-2 and L2-3. No evidence of fracture or other focal lesion. L1-2 and L2-3 findings could certainly be associated with back pain. IMPRESSION: Distant solid fusion from L3 to the sacrum. Adjacent segment degenerative changes at L1-2 and L2-3 that could be symptomatic. Electronically Signed   By: Nelson Chimes M.D.   On: 07/03/2017 15:59   Dg Knee Complete 4 Views Left  Result Date: 07/03/2017 CLINICAL DATA:  Left knee pain. EXAM: LEFT KNEE - COMPLETE 4+ VIEW COMPARISON:  None. FINDINGS: No evidence of fracture, dislocation, or joint effusion. No evidence of arthropathy or other focal bone abnormality. Soft tissues are unremarkable. Insignificant age related chondrocalcinosis. Vascular calcification. IMPRESSION: Negative. Electronically Signed   By: Nelson Chimes M.D.   On: 07/03/2017 16:00   Dg Hip Unilat W Or Wo Pelvis 2-3 Views Left  Result Date: 07/03/2017 CLINICAL DATA:  Low back and left hip pain. EXAM: DG HIP (WITH OR WITHOUT PELVIS) 2-3V LEFT COMPARISON:  None. FINDINGS: Osteoarthritis of both hips, worse on the right than the left considerably. No other pelvic bone finding. Previous lumbosacral fusion. Ordinary sacroiliac osteoarthritis. IMPRESSION: Mild osteoarthritis of the left hip. No acute or focal finding. Advanced osteoarthritis of the right hip. Sacroiliac osteoarthritis. Electronically Signed    By: Nelson Chimes M.D.   On: 07/03/2017 15:59     IMPRESSION AND PLAN:   1.  Hyponatremia.  Stop hydrochlorothiazide.  Gentle IV fluid hydration.  Likely combination of not eating very well. 2.  Hiccups and hallucinating.  Try to avoid medications that can cause altered mental status.  Stop oxycodone.  Tramadol if needed.  Try to hold off on Thorazine or do a lower dose if hiccups return. 3.  B-cell lymphoma.  I read Dr. Lonia Blood oncologist's last note and he said options are limited.  Overall prognosis is poor.  Full code at this time. 4.  Atrial fibrillation history.  Rate controlled with metoprolol.  No anticoagulation on this patient's list secondary to bleeding in the past 5.  Kidney transplant on prednisone and tacrolimus for immunosuppression.  We will get nephrology consultation 6.  Chronic kidney disease stage II after kidney transplantation 7.  BPH on finasteride and Flomax 8.  GERD on PPI and as needed Carafate  All the records are reviewed and case discussed with ED provider. Management plans discussed with the patient, family and they are in agreement.  CODE STATUS: Full code  TOTAL TIME TAKING CARE OF THIS PATIENT: 50 minutes.    Loletha Grayer M.D on 07/03/2017 at 8:46 PM  Between 7am to 6pm - Pager - [phone]  After 6pm call admission pager [phone]  [facility]  [phone]  CC: Primary care physician; Arnetha Courser, MD

## 2017-07-03 NOTE — ED Notes (Signed)
Pt states R arm pain (had surgery on arm). States shoulder pain. R hip pain. R leg pain as well. States "articficial knee" in R leg. C/o lower abd pain where hernia is and L knee pain. Wife and granddaughter at bedside. Wife states CA pt. Has been hurting in all these areas since dx with CA. States started on oxycodone which pt starting hallucinating and hiccupping from oxycodone. Wife called doctor and was told to stop oxycodone. Pt denies taking aleeve or tylenol. Pt states still moving bowels well.

## 2017-07-03 NOTE — ED Notes (Signed)
Pt taken to xray 

## 2017-07-03 NOTE — ED Notes (Signed)
Dr Robinson at bedside 

## 2017-07-03 NOTE — ED Triage Notes (Signed)
Pt reports that he is a cancer pt and is taking oxycodone and it is hallucinations, wife called MD they told her to stop taking it. He is having pain still. He also gets Hiccups and his PMD prescribed him Baclofen. He took one but continue to come and go and he feels like it is taking his breath. His PMD told pt to come here to be evaluated. He currently is not hiccupping.

## 2017-07-03 NOTE — Unmapped (Signed)
Hi,    The patient has contacted the [facility] with report of uncontrolled pain, SOB & hiccups. We have sent a page to the Oncology Triage Pager per our urgent page guidelines.    Please call pt at [phone]      Thank you,  Cecille Po  [facility]  [phone]

## 2017-07-03 NOTE — Unmapped (Signed)
Patient called in and stated that he was taking his Oxycodone and was having hallucinations and still having pain.  Patient wanted to know if there was anything else he could take.

## 2017-07-03 NOTE — Unmapped (Signed)
[facility] Triage Note     Patient: MALIKAI GUT     Reason for call:   1.Return call urgent page received, patient taking                Percocet 5-325mg  & is having hallucinations.    2.Patient SOB, uncontrolled pain & hiccups    Time call returned: 9:14am & 10:53pm     Phone Assessment: Spoke with wife & patient took Percocet 5-325 mg for pain 1 tab yesterday in the am for pain & without issue. At approx 3 pm patient took Percocet 5-325 mg 1 tab & started to have symptoms that bad things were going to happen to him  & hallucinations around 5 pm. Wife stated that this same side effect happened before in 2017 & the patient stopped taking the medication. No further Percocet was given after 3 pm dose. Patients hallucination & bad things are going to happen feelings have not really improved per wife.   Received a Urgent Page,@ 10:53 am ,spoke with wife, she stated that the patient was having brief episodes of SOB that would come and go & patient complains of his leg pain is not tolerable. Instructed wife that provider only recommends Tylenol or Aleve @ directed for pain. Due to the brief periods of SOB and the patient remains to be having hallucinations  M.Sterlina recommends patient be evaluated at the patients nearest ED.  Triage Recommendations: Wife instructed to stop administering Percocet. Paged M.Sterlina regarding patient. Per M.Sterlina patient's Percocet is not  prescribed by our care team, Stop taking Percocet, recommends Tylenol or Aleve @ directed for pain.  Wife instructed to to take patient to nearest ED for evaluation.  Patient Response: Wife expressed verbal understanding     Outstanding tasks: Notification

## 2017-07-04 DIAGNOSIS — D696 Thrombocytopenia, unspecified: Secondary | ICD-10-CM

## 2017-07-04 DIAGNOSIS — I4891 Unspecified atrial fibrillation: Secondary | ICD-10-CM

## 2017-07-04 DIAGNOSIS — D701 Agranulocytosis secondary to cancer chemotherapy: Secondary | ICD-10-CM

## 2017-07-04 DIAGNOSIS — R5383 Other fatigue: Secondary | ICD-10-CM

## 2017-07-04 DIAGNOSIS — C8330 Diffuse large B-cell lymphoma, unspecified site: Secondary | ICD-10-CM

## 2017-07-04 DIAGNOSIS — K219 Gastro-esophageal reflux disease without esophagitis: Secondary | ICD-10-CM

## 2017-07-04 DIAGNOSIS — G9341 Metabolic encephalopathy: Secondary | ICD-10-CM

## 2017-07-04 DIAGNOSIS — R531 Weakness: Secondary | ICD-10-CM

## 2017-07-04 DIAGNOSIS — Z87891 Personal history of nicotine dependence: Secondary | ICD-10-CM

## 2017-07-04 DIAGNOSIS — N186 End stage renal disease: Secondary | ICD-10-CM

## 2017-07-04 DIAGNOSIS — D47Z1 Post-transplant lymphoproliferative disorder (PTLD): Secondary | ICD-10-CM

## 2017-07-04 DIAGNOSIS — T451X5A Adverse effect of antineoplastic and immunosuppressive drugs, initial encounter: Secondary | ICD-10-CM

## 2017-07-04 DIAGNOSIS — T8690 Unspecified complication of unspecified transplanted organ and tissue: Secondary | ICD-10-CM

## 2017-07-04 DIAGNOSIS — Z79899 Other long term (current) drug therapy: Secondary | ICD-10-CM

## 2017-07-04 DIAGNOSIS — E785 Hyperlipidemia, unspecified: Secondary | ICD-10-CM

## 2017-07-04 DIAGNOSIS — R066 Hiccough: Secondary | ICD-10-CM

## 2017-07-04 DIAGNOSIS — E871 Hypo-osmolality and hyponatremia: Principal | ICD-10-CM

## 2017-07-04 DIAGNOSIS — Z66 Do not resuscitate: Secondary | ICD-10-CM

## 2017-07-04 DIAGNOSIS — E875 Hyperkalemia: Secondary | ICD-10-CM

## 2017-07-04 DIAGNOSIS — Z940 Kidney transplant status: Secondary | ICD-10-CM

## 2017-07-04 LAB — BASIC METABOLIC PANEL
Anion gap: 8 (ref 5–15)
BUN: 23 mg/dL — AB (ref 6–20)
CALCIUM: 10.2 mg/dL (ref 8.9–10.3)
CO2: 20 mmol/L — ABNORMAL LOW (ref 22–32)
Chloride: 102 mmol/L (ref 101–111)
Creatinine, Ser: 1.48 mg/dL — ABNORMAL HIGH (ref 0.61–1.24)
GFR calc Af Amer: 51 mL/min — ABNORMAL LOW (ref >60)
GFR, EST NON AFRICAN AMERICAN: 44 mL/min — AB (ref >60)
GLUCOSE: 67 mg/dL (ref 65–99)
POTASSIUM: 5.3 mmol/L — AB (ref 3.5–5.1)
SODIUM: 130 mmol/L — AB (ref 135–145)

## 2017-07-04 LAB — CBC
HEMATOCRIT: 28.2 % — AB (ref 40.0–52.0)
Hemoglobin: 9.5 g/dL — ABNORMAL LOW (ref 13.0–18.0)
MCH: 27.8 pg (ref 26.0–34.0)
MCHC: 33.7 g/dL (ref 32.0–36.0)
MCV: 82.4 fL (ref 80.0–100.0)
PLATELETS: 120 10*3/uL — AB (ref 150–440)
RBC: 3.42 MIL/uL — ABNORMAL LOW (ref 4.40–5.90)
RDW: 17.1 % — AB (ref 11.5–14.5)
WBC: 2.2 10*3/uL — ABNORMAL LOW (ref 3.8–10.6)

## 2017-07-04 MED ORDER — MEGESTROL ACETATE 400 MG/10ML PO SUSP
800.0000 mg | Freq: Every day | ORAL | Status: DC
  Administered 2017-07-05: 800 mg via ORAL
  Filled 2017-07-04: qty 20

## 2017-07-04 MED ORDER — SODIUM BICARBONATE 8.4 % IV SOLN
INTRAVENOUS | Status: DC
  Administered 2017-07-04: 14:00:00 via INTRAVENOUS
  Filled 2017-07-04 (×3): qty 150

## 2017-07-04 MED ORDER — ENOXAPARIN SODIUM 30 MG/0.3ML ~~LOC~~ SOLN
30.0000 mg | SUBCUTANEOUS | Status: DC
  Filled 2017-07-04: qty 0.3

## 2017-07-04 MED ORDER — MIRTAZAPINE 15 MG PO TABS
15.0000 mg | ORAL_TABLET | Freq: Every day | ORAL | Status: DC
  Administered 2017-07-04: 15 mg via ORAL
  Filled 2017-07-04: qty 1

## 2017-07-04 NOTE — Progress Notes (Signed)
Pharmacy Anticoagulation Note  77 y/o M ordered Lovenox 40 mg daily for DVT prophylaxis.   Filed Weights   07/03/17 1317 07/03/17 2128  Weight: 116 lb (52.6 kg) 107 lb 12.8 oz (48.9 kg)   Body mass index is 17.94 kg/m.  Estimated Creatinine Clearance: 28.9 mL/min (A) (by C-G formula based on SCr of 1.48 mg/dL (H)).  Will decrease Lovenox to 30 mg daily for renal insufficiency.   Ulice Dash, PharmD Clinical Pharmacist

## 2017-07-04 NOTE — Evaluation (Signed)
Physical Therapy Evaluation Patient Details Name: Matthew Brown MRN: [id number] DOB: [birth year] Today's Date: 07/04/2017   History of Present Illness  Pt admitted for hyponatremia.  PMH includes lymphoma, atrial fibrillation, kidney transplant, chrionic HA, dysrhythmia, GERD, gout and hearing loss.  Clinical Impression  Pt is a 77 year old man who lives in a one story home with his wife.  He is able to perform bed mobility with use of bed rail, requiring increased time to sit upright.  Pt was able to ambulate 240 ft with RW with min VC's for placement of hands and RW in proximity to body.  Pt reported that pain in his R knee limited his mobility more than anything.  Pt presented with overall weakness of UE and LE and sensation intact.  Pt will continue to benefit from skilled PT with focus on strength, pain management, tolerance to activity and stair negotiation.    Follow Up Recommendations Home health PT    Equipment Recommendations       Recommendations for Other Services       Precautions / Restrictions Precautions Precautions: Fall Restrictions Weight Bearing Restrictions: No      Mobility  Bed Mobility Overal bed mobility: Modified Independent             General bed mobility comments: Pt able to perform supine to sit and sit to supine with use of bed rail.  Transfers Overall transfer level: Modified independent Equipment used: Rolling walker (2 wheeled)             General transfer comment: Pt is able to perform STS with use of BUE and RW.  Ambulation/Gait Ambulation/Gait assistance: Min guard Ambulation Distance (Feet): 240 Feet Assistive device: Rolling walker (2 wheeled)     Gait velocity interpretation: Below normal speed for age/gender General Gait Details: Pt presented with low foot clearance, decreased step length and forward flexed posture.  Pt is able to negotiate RW with min VC's from PT.   Stairs            Wheelchair Mobility     Modified Rankin (Stroke Patients Only)       Balance Overall balance assessment: Modified Independent                                           Pertinent Vitals/Pain Pain Assessment: No/denies pain    Home Living Family/patient expects to be discharged to:: Private residence Living Arrangements: Spouse/significant other;Children Available Help at Discharge: Family Type of Home: House Home Access: Stairs to enter Entrance Stairs-Rails: Can reach both Entrance Stairs-Number of Steps: 2 Home Layout: One level Home Equipment: Environmental consultant - 2 wheels      Prior Function Level of Independence: Independent with assistive device(s)               Hand Dominance        Extremity/Trunk Assessment   Upper Extremity Assessment Upper Extremity Assessment: Generalized weakness    Lower Extremity Assessment Lower Extremity Assessment: Generalized weakness    Cervical / Trunk Assessment Cervical / Trunk Assessment: Kyphotic  Communication   Communication: No difficulties  Cognition Arousal/Alertness: Awake/alert Behavior During Therapy: Restless Overall Cognitive Status: Within Functional Limits for tasks assessed  General Comments      Exercises     Assessment/Plan    PT Assessment Patient needs continued PT services  PT Problem List Decreased strength;Decreased activity tolerance;Decreased balance;Decreased mobility;Pain       PT Treatment Interventions DME instruction;Gait training;Stair training;Functional mobility training;Therapeutic activities;Therapeutic exercise;Balance training;Patient/family education    PT Goals (Current goals can be found in the Care Plan section)  Acute Rehab PT Goals Patient Stated Goal: To return home and seek help for his knee pain. PT Goal Formulation: With patient/family Time For Goal Achievement: 07/18/17 Potential to Achieve Goals: Good     Frequency Min 2X/week   Barriers to discharge        Co-evaluation               AM-PAC PT "6 Clicks" Daily Activity  Outcome Measure Difficulty turning over in bed (including adjusting bedclothes, sheets and blankets)?: A Little Difficulty moving from lying on back to sitting on the side of the bed? : A Little Difficulty sitting down on and standing up from a chair with arms (e.g., wheelchair, bedside commode, etc,.)?: A Little Help needed moving to and from a bed to chair (including a wheelchair)?: A Little Help needed walking in hospital room?: A Little Help needed climbing 3-5 steps with a railing? : A Little 6 Click Score: 18    End of Session Equipment Utilized During Treatment: Gait belt Activity Tolerance: Patient tolerated treatment well;Patient limited by pain(Reports that he always has pain in his R knee.) Patient left: in bed;with call bell/phone within reach;with bed alarm set;with family/visitor present Nurse Communication: Mobility status PT Visit Diagnosis: Unsteadiness on feet (R26.81);Muscle weakness (generalized) (M62.81);Pain Pain - Right/Left: Right Pain - part of body: Knee    Time: 1230-1250 PT Time Calculation (min) (ACUTE ONLY): 20 min   Charges:   PT Evaluation $PT Eval Low Complexity: 1 Low PT Treatments $Therapeutic Activity: 8-22 mins   PT G Codes:   PT G-Codes **NOT FOR INPATIENT CLASS** Functional Assessment Tool Used: AM-PAC 6 Clicks Basic Mobility Functional Limitation: Mobility: Walking and moving around Mobility: Walking and Moving Around Current Status (S8270): At least 60 percent but less than 80 percent impaired, limited or restricted Mobility: Walking and Moving Around Goal Status [phone]): At least 1 percent but less than 20 percent impaired, limited or restricted    Roxanne Gates, PT, DPT   Roxanne Gates 07/04/2017, 2:21 PM

## 2017-07-04 NOTE — Progress Notes (Signed)
[place] at [facility]                                                                                                                                                                                  Patient Demographics   Matthew Brown, is a 77 y.o. male, DOB - [birth year], JJH:[id number]  Admit date - 07/03/2017   Admitting Physician Loletha Grayer, MD  Outpatient Primary MD for the patient is Lada, Satira Anis, MD   LOS - 1  Subjective: Patient admitted with decreased p.o. intake noted to have hyponatremia Hiccups now resolved    Review of Systems:   CONSTITUTIONAL: No documented fever.  Positive fatigue, positive weakness. No weight gain, no weight loss.  EYES: No blurry or double vision.  ENT: No tinnitus. No postnasal drip. No redness of the oropharynx.  RESPIRATORY: No cough, no wheeze, no hemoptysis. No dyspnea.  CARDIOVASCULAR: No chest pain. No orthopnea. No palpitations. No syncope.  GASTROINTESTINAL: No nausea, no vomiting or diarrhea. No abdominal pain. No melena or hematochezia.  Chronic dysphagia GENITOURINARY: No dysuria or hematuria.  ENDOCRINE: No polyuria or nocturia. No heat or cold intolerance.  HEMATOLOGY: No anemia. No bruising. No bleeding.  INTEGUMENTARY: No rashes. No lesions.  MUSCULOSKELETAL: No arthritis. No swelling. No gout.  NEUROLOGIC: No numbness, tingling, or ataxia. No seizure-type activity.  PSYCHIATRIC: No anxiety. No insomnia. No ADD.    Vitals:   Vitals:   07/03/17 2030 07/03/17 2128 07/03/17 2133 07/04/17 0502  BP: (!) 164/57  (!) 190/75 (!) 155/84  Pulse:   (!) 101 74  Resp: 12  16 19   Temp:   (!) 97.5 F (36.4 C) 97.6 F (36.4 C)  TempSrc:      SpO2:   100% 100%  Weight:  107 lb 12.8 oz (48.9 kg)    Height:  5\' 5"  (1.651 m)      Wt Readings from Last 3 Encounters:  07/03/17 107 lb 12.8 oz (48.9 kg)  06/24/17 116 lb (52.6 kg)  06/10/17 118 lb (53.5 kg)     Intake/Output Summary (Last 24 hours)  at 07/04/2017 1601 Last data filed at 07/04/2017 0117 Gross per 24 hour  Intake 1086 ml  Output 200 ml  Net 886 ml    Physical Exam:   GENERAL: Pleasant-appearing in no apparent distress.  HEAD, EYES, EARS, NOSE AND THROAT: Atraumatic, normocephalic. Extraocular muscles are intact. Pupils equal and reactive to light. Sclerae anicteric. No conjunctival injection. No oro-pharyngeal erythema.  NECK: Supple. There is no jugular venous distention. No bruits, no lymphadenopathy, no thyromegaly.  HEART: Regular rate and rhythm,. No murmurs, no rubs, no clicks.  LUNGS: Clear to  auscultation bilaterally. No rales or rhonchi. No wheezes.  ABDOMEN: Soft, flat, nontender, nondistended. Has good bowel sounds. No hepatosplenomegaly appreciated.  EXTREMITIES: No evidence of any cyanosis, clubbing, or peripheral edema.  +2 pedal and radial pulses bilaterally.  NEUROLOGIC: The patient is alert, awake, and oriented x3 with no focal motor or sensory deficits appreciated bilaterally.  SKIN: Moist and warm with no rashes appreciated.  Psych: Not anxious, depressed LN: No inguinal LN enlargement    Antibiotics   Anti-infectives (From admission, onward)   None      Medications   Scheduled Meds: . allopurinol  200 mg Oral Daily  . enoxaparin (LOVENOX) injection  30 mg Subcutaneous Q24H  . finasteride  5 mg Oral Daily  . [START ON 07/05/2017] megestrol  800 mg Oral Daily  . metoprolol tartrate  12.5 mg Oral BID  . mirtazapine  15 mg Oral QHS  . multivitamin with minerals  1 tablet Oral Daily  . pantoprazole  40 mg Oral BID  . predniSONE  5 mg Oral Daily  . tacrolimus  3 mg Oral BID  . tamsulosin  0.4 mg Oral Daily   Continuous Infusions: .  sodium bicarbonate  infusion 1000 mL 75 mL/hr at 07/04/17 1345   PRN Meds:.acetaminophen **OR** acetaminophen, albuterol, colchicine, ondansetron, polyvinyl alcohol, sucralfate   Data Review:   Micro Results No results found for this or any previous  visit (from the past 240 hour(s)).  Radiology Reports Dg Chest 2 View  Result Date: 07/03/2017 CLINICAL DATA:  Back pain and hiccups. EXAM: CHEST  2 VIEW COMPARISON:  07/08/2016 and PET-CT from 01/14/2017 FINDINGS: Right chest wall port a catheter is identified with tip in the cavoatrial junction. There is mild cardiac enlargement and aortic atherosclerosis. Diffuse bilateral hazy lung opacities are identified. No lobar consolidation. Left lung nodule is again noted measuring 1.3 cm. IMPRESSION: 1. Diffuse bilateral hazy lung opacities compatible with pulmonary edema versus atypical infection/inflammation. Electronically Signed   By: Kerby Moors M.D.   On: 07/03/2017 15:57   Dg Lumbar Spine Complete  Result Date: 07/03/2017 CLINICAL DATA:  Low back pain, left hip and knee pain. EXAM: LUMBAR SPINE - COMPLETE 4+ VIEW COMPARISON:  CT 03/08/2017 FINDINGS: Mild thoracolumbar curvature convex to the left. Previous fusion from L3 to the sacrum appears solid. Posterior hardware intact. Degenerative disc disease in the lower thoracic region and more pronounced at L1-2 and L2-3. No evidence of fracture or other focal lesion. L1-2 and L2-3 findings could certainly be associated with back pain. IMPRESSION: Distant solid fusion from L3 to the sacrum. Adjacent segment degenerative changes at L1-2 and L2-3 that could be symptomatic. Electronically Signed   By: Nelson Chimes M.D.   On: 07/03/2017 15:59   Dg Knee Complete 4 Views Left  Result Date: 07/03/2017 CLINICAL DATA:  Left knee pain. EXAM: LEFT KNEE - COMPLETE 4+ VIEW COMPARISON:  None. FINDINGS: No evidence of fracture, dislocation, or joint effusion. No evidence of arthropathy or other focal bone abnormality. Soft tissues are unremarkable. Insignificant age related chondrocalcinosis. Vascular calcification. IMPRESSION: Negative. Electronically Signed   By: Nelson Chimes M.D.   On: 07/03/2017 16:00   Dg Hip Unilat W Or Wo Pelvis 2-3 Views Left  Result Date:  07/03/2017 CLINICAL DATA:  Low back and left hip pain. EXAM: DG HIP (WITH OR WITHOUT PELVIS) 2-3V LEFT COMPARISON:  None. FINDINGS: Osteoarthritis of both hips, worse on the right than the left considerably. No other pelvic bone finding. Previous lumbosacral fusion. Ordinary  sacroiliac osteoarthritis. IMPRESSION: Mild osteoarthritis of the left hip. No acute or focal finding. Advanced osteoarthritis of the right hip. Sacroiliac osteoarthritis. Electronically Signed   By: Nelson Chimes M.D.   On: 07/03/2017 15:59     CBC Recent Labs  Lab 07/03/17 1508 07/04/17 0629  WBC 2.1* 2.2*  HGB 9.4* 9.5*  HCT 27.8* 28.2*  PLT 128* 120*  MCV 81.1 82.4  MCH 27.5 27.8  MCHC 33.9 33.7  RDW 16.3* 17.1*  LYMPHSABS 0.1*  --   MONOABS 0.3  --   EOSABS 0.0  --   BASOSABS 0.0  --     Chemistries  Recent Labs  Lab 07/03/17 1508 07/03/17 1819 07/04/17 0629  NA 125* 127* 130*  K 4.9 5.0 5.3*  CL 96* 100* 102  CO2 20* 18* 20*  GLUCOSE 106* 88 67  BUN 25* 23* 23*  CREATININE 1.37* 1.25* 1.48*  CALCIUM 10.2 9.4 10.2  AST 32  --   --   ALT 8*  --   --   ALKPHOS 80  --   --   BILITOT 1.1  --   --    ------------------------------------------------------------------------------------------------------------------ estimated creatinine clearance is 28.9 mL/min (A) (by C-G formula based on SCr of 1.48 mg/dL (H)). ------------------------------------------------------------------------------------------------------------------ No results for input(s): HGBA1C in the last 72 hours. ------------------------------------------------------------------------------------------------------------------ No results for input(s): CHOL, HDL, LDLCALC, TRIG, CHOLHDL, LDLDIRECT in the last 72 hours. ------------------------------------------------------------------------------------------------------------------ No results for input(s): TSH, T4TOTAL, T3FREE, THYROIDAB in the last 72 hours.  Invalid input(s):  FREET3 ------------------------------------------------------------------------------------------------------------------ No results for input(s): VITAMINB12, FOLATE, FERRITIN, TIBC, IRON, RETICCTPCT in the last 72 hours.  Coagulation profile No results for input(s): INR, PROTIME in the last 168 hours.  No results for input(s): DDIMER in the last 72 hours.  Cardiac Enzymes No results for input(s): CKMB, TROPONINI, MYOGLOBIN in the last 168 hours.  Invalid input(s): CK ------------------------------------------------------------------------------------------------------------------ Invalid input(s): [place]   1.  Hyponatremia.    Due to dehydration now improved continue IV fluids 2.  Hiccups and hallucinating.  Try to avoid medications that can cause altered mental status.  Stop oxycodone.  Tramadol if needed.  Try to hold off on Thorazine or do a lower dose if hiccups return. 3.  B-cell lymphoma.  Has a follow-up with his oncologist with a PET scan prognosis poor 4.  Atrial fibrillation history.  Rate controlled with metoprolol.  No anticoagulation on this patient's list secondary to bleeding in the past 5.  Kidney transplant on prednisone and tacrolimus for immunosuppression.   6.  Chronic kidney disease stage II after kidney transplantation 7.  BPH on finasteride and Flomax 8.  GERD on PPI and as needed Carafate 9.  Decreased p.o. intake I will increase his Megace dose try Remeron at bedtime      Code Status Orders  (From admission, onward)        Start     Ordered   07/03/17 2040  Full code  Continuous     07/03/17 2040    Code Status History    Date Active Date Inactive Code Status Order ID Comments User Context   07/08/2016 19:01 07/14/2016 00:52 Full Code [id number]  Bettey Costa, MD Inpatient   06/26/2016 01:52 06/28/2016 17:29 Full Code [id number]  Harvie Bridge, DO Inpatient   05/18/2016 15:45 05/22/2016 21:09 Full Code [id number]  Demetrios Loll, MD  Inpatient   05/06/2016 16:23 05/13/2016 23:03 Full Code [id number]  Dustin Flock, MD Inpatient   04/29/2016 01:24  04/30/2016 21:35 Full Code [id number]  Lance Coon, MD Inpatient    Advance Directive Documentation     Most Recent Value  Type of Advance Directive  Healthcare Power of Attorney  Pre-existing out of facility DNR order (yellow form or pink MOST form)  No data  "MOST" Form in Place?  No data           Consults  nephrology  DVT Prophylaxis   scd's  Lab Results  Component Value Date   PLT 120 (L) 07/04/2017     Time Spent in minutes   34min  Greater than 50% of time spent in care coordination and counseling patient regarding the condition and plan of care.   Dustin Flock M.D on 07/04/2017 at 4:01 PM  Between 7am to 6pm - Pager - [phone]  After 6pm go to [email or web address] - password [email or web address] [facility]   Office  [phone]

## 2017-07-04 NOTE — Progress Notes (Signed)
[facility]  ROUNDING NOTE   Subjective:   Matthew Brown admitted to [facility] on 07/03/2017 for Hyponatremia [E87.1] Hiccups [R06.6]  Stopped hydrochlorothiazide.  Started on IVF NS at 58mL/hr  Neutropenic precautions  Objective:  Vital signs in last 24 hours:  Temp:  [97.5 F (36.4 C)-97.8 F (36.6 C)] 97.6 F (36.4 C) (01/12 0502) Pulse Rate:  [38-101] 74 (01/12 0502) Resp:  [12-20] 19 (01/12 0502) BP: (135-190)/(57-84) 155/84 (01/12 0502) SpO2:  [98 %-100 %] 100 % (01/12 0502) Weight:  [48.9 kg (107 lb 12.8 oz)-52.6 kg (116 lb)] 48.9 kg (107 lb 12.8 oz) (01/11 2128)  Weight change:  Filed Weights   07/03/17 1317 07/03/17 2128  Weight: 52.6 kg (116 lb) 48.9 kg (107 lb 12.8 oz)    Intake/Output: I/O last 3 completed shifts: In: 1086 [I.V.:86; IV Piggyback:1000] Out: 200 [Urine:200]   Intake/Output this shift:  No intake/output data recorded.  Physical Exam: General: NAD, laying in bed  Head: Normocephalic, atraumatic. Moist oral mucosal membranes  Eyes: Anicteric, PERRL  Neck: Supple, trachea midline  Lungs:  Clear to auscultation  Heart: Regular rate and rhythm  Abdomen:  Soft, nontender,   Extremities:  no peripheral edema.  Neurologic: Nonfocal, moving all four extremities  Skin: No lesions  Access: none    Basic Metabolic Panel: Recent Labs  Lab 07/03/17 1508 07/03/17 1819 07/04/17 0629  NA 125* 127* 130*  K 4.9 5.0 5.3*  CL 96* 100* 102  CO2 20* 18* 20*  GLUCOSE 106* 88 67  BUN 25* 23* 23*  CREATININE 1.37* 1.25* 1.48*  CALCIUM 10.2 9.4 10.2    Liver Function Tests: Recent Labs  Lab 07/03/17 1508  AST 32  ALT 8*  ALKPHOS 80  BILITOT 1.1  PROT 6.4*  ALBUMIN 3.7   No results for input(s): LIPASE, AMYLASE in the last 168 hours. No results for input(s): AMMONIA in the last 168 hours.  CBC: Recent Labs  Lab 07/03/17 1508 07/04/17 0629  WBC 2.1* 2.2*  NEUTROABS 1.7  --   HGB 9.4* 9.5*  HCT 27.8* 28.2*  MCV 81.1  82.4  PLT 128* 120*    Cardiac Enzymes: No results for input(s): CKTOTAL, CKMB, CKMBINDEX, TROPONINI in the last 168 hours.  BNP: Invalid input(s): POCBNP  CBG: No results for input(s): GLUCAP in the last 168 hours.  Microbiology: Results for orders placed or performed during the hospital encounter of 06/09/17  Surgical pcr screen     Status: None   Collection Time: 06/09/17  1:48 PM  Result Value Ref Range Status   MRSA, PCR NEGATIVE NEGATIVE Final   Staphylococcus aureus NEGATIVE NEGATIVE Final    Comment: (NOTE) The Xpert SA Assay (FDA approved for NASAL specimens in patients 4 years of age and older), is one component of a comprehensive surveillance program. It is not intended to diagnose infection nor to guide or monitor treatment.     Coagulation Studies: No results for input(s): LABPROT, INR in the last 72 hours.  Urinalysis: Recent Labs    07/03/17 1858  COLORURINE YELLOW*  LABSPEC 1.011  PHURINE 6.0  GLUCOSEU NEGATIVE  HGBUR NEGATIVE  BILIRUBINUR NEGATIVE  KETONESUR 5*  PROTEINUR NEGATIVE  NITRITE NEGATIVE  LEUKOCYTESUR NEGATIVE      Imaging: Dg Chest 2 View  Result Date: 07/03/2017 CLINICAL DATA:  Back pain and hiccups. EXAM: CHEST  2 VIEW COMPARISON:  07/08/2016 and PET-CT from 01/14/2017 FINDINGS: Right chest wall port a catheter is identified with tip in  the cavoatrial junction. There is mild cardiac enlargement and aortic atherosclerosis. Diffuse bilateral hazy lung opacities are identified. No lobar consolidation. Left lung nodule is again noted measuring 1.3 cm. IMPRESSION: 1. Diffuse bilateral hazy lung opacities compatible with pulmonary edema versus atypical infection/inflammation. Electronically Signed   By: Kerby Moors M.D.   On: 07/03/2017 15:57   Dg Lumbar Spine Complete  Result Date: 07/03/2017 CLINICAL DATA:  Low back pain, left hip and knee pain. EXAM: LUMBAR SPINE - COMPLETE 4+ VIEW COMPARISON:  CT 03/08/2017 FINDINGS: Mild  thoracolumbar curvature convex to the left. Previous fusion from L3 to the sacrum appears solid. Posterior hardware intact. Degenerative disc disease in the lower thoracic region and more pronounced at L1-2 and L2-3. No evidence of fracture or other focal lesion. L1-2 and L2-3 findings could certainly be associated with back pain. IMPRESSION: Distant solid fusion from L3 to the sacrum. Adjacent segment degenerative changes at L1-2 and L2-3 that could be symptomatic. Electronically Signed   By: Nelson Chimes M.D.   On: 07/03/2017 15:59   Dg Knee Complete 4 Views Left  Result Date: 07/03/2017 CLINICAL DATA:  Left knee pain. EXAM: LEFT KNEE - COMPLETE 4+ VIEW COMPARISON:  None. FINDINGS: No evidence of fracture, dislocation, or joint effusion. No evidence of arthropathy or other focal bone abnormality. Soft tissues are unremarkable. Insignificant age related chondrocalcinosis. Vascular calcification. IMPRESSION: Negative. Electronically Signed   By: Nelson Chimes M.D.   On: 07/03/2017 16:00   Dg Hip Unilat W Or Wo Pelvis 2-3 Views Left  Result Date: 07/03/2017 CLINICAL DATA:  Low back and left hip pain. EXAM: DG HIP (WITH OR WITHOUT PELVIS) 2-3V LEFT COMPARISON:  None. FINDINGS: Osteoarthritis of both hips, worse on the right than the left considerably. No other pelvic bone finding. Previous lumbosacral fusion. Ordinary sacroiliac osteoarthritis. IMPRESSION: Mild osteoarthritis of the left hip. No acute or focal finding. Advanced osteoarthritis of the right hip. Sacroiliac osteoarthritis. Electronically Signed   By: Nelson Chimes M.D.   On: 07/03/2017 15:59     Medications:   . sodium chloride 75 mL/hr at 07/04/17 1241   . allopurinol  200 mg Oral Daily  . enoxaparin (LOVENOX) injection  40 mg Subcutaneous Q24H  . finasteride  5 mg Oral Daily  . [START ON 07/05/2017] megestrol  800 mg Oral Daily  . metoprolol tartrate  12.5 mg Oral BID  . mirtazapine  15 mg Oral QHS  . multivitamin with minerals  1  tablet Oral Daily  . pantoprazole  40 mg Oral BID  . predniSONE  5 mg Oral Daily  . tacrolimus  3 mg Oral BID  . tamsulosin  0.4 mg Oral Daily   acetaminophen **OR** acetaminophen, albuterol, colchicine, ondansetron, polyvinyl alcohol, sucralfate  Assessment/ Plan:  Mr. BUFORD BREMER is a 77 y.o. black male cadaveric renal transplant 2006 with baseline creatinine of 1.6-1.9 followed by [facility], Dr. Suzan Nailer on prograf, myfortic and prednisone. Patient with past medical history of pulmonary hyeprtension, atrial fibrillation, hypertension, anemia, hyperlipidemia, gout, GERD, BPH, history of GI bleed , and diffuse large B cell lymphoma   1. Acute renal failure on chronic kidney disease stage IIIT 2. Hyponatremia 3. Hyperkalemia 4. Metabolic acidosis  Plan Discontinued hydrochlorothiazide Continue IV fluids: change to sodium bicarbonate gtt      LOS: 1 Mohammed Mcandrew 1/12/201912:58 PM

## 2017-07-04 NOTE — Progress Notes (Signed)
[facility] responded to request for prayer. [name] arrived to find patient, spouse, and grand daughter in room. CH provided emotional support as patient told about his 80 years of marriage and bing good to his wife. Patient's wife stated that patient hadn't been feeling well and patient stated that he was in pain and that is why he came to hospital. [facility] provided prayer and left upon the doctor coming to the room   07/04/17 1215  Clinical Encounter Type  Visited With Patient;Family;Health care provider  Visit Type Initial;Spiritual support  Referral From Nurse;Physician

## 2017-07-04 NOTE — Plan of Care (Signed)
Pt is progressing. Ambulated around nurses station with PT.

## 2017-07-04 NOTE — Consult Note (Signed)
Hematology/Oncology Consult note [facility] [phone] [phone]  Patient Care Team: Arnetha Courser, MD as PCP - General (Family Medicine) Claybon Jabs, MD (Gastroenterology) Dewitt Rota, MD (Gastroenterology) True, Isabella Bowens, MD as Referring Physician (Nephrology) Matthew Burly Lenna Sciara, MD (Internal Medicine) Rushing, Ledell Noss, MD (Ophthalmology) Go, Kelby Aline, MD as Referring Physician (Ophthalmology) True, Isabella Bowens, MD as Referring Physician (Nephrology) Sharlet Salina, MD as Referring Physician (Physical Medicine and Rehabilitation) Merlene Morse, MD as Referring Physician (Orthopedic Surgery) Dittus, Runell Gess, DO as Referring Physician (Pediatrics)   Name of the patient: Matthew Brown  [id number]  03-07-1941    Reason for consult- h/o DLBCL (post transplant lymphoproliferative disorder) Stage IV   Requesting physician: Dr. Posey Pronto  Date of visit: 07/04/2017   History of presenting illness- Patient is a 77 yr old male with a h/o Stage IV DLBCL post transplant lymphoproliferative disorder who sees Dr. Mike Gip as an outpatient. He progressed on R mini CHOP followed by gemcitabine and most recently has been on Rituxan- revlimid. He has had prolonged pancytopenia since October 2018 likely secondary to bone marrow involvement with lymphoma. Recently seen by Dr. Lonia Blood who recommended repeat PET/Ct and low dose revlimid. Patient wanted to continue his treatments at [facility] at this time. Overall his prognosis is poor. He was admitted with symptoms of hallucinations and hiccups after he was on oxycodone and baclofen. Found to have hyponatremia with sodium of 125 on admission   ECOG PS- 2  Pain scale- 0   Review of systems- Review of Systems  Constitutional: Positive for malaise/fatigue. Negative for chills, fever and weight loss.  HENT: Negative for congestion, ear discharge and nosebleeds.   Eyes: Negative for blurred  vision.  Respiratory: Negative for cough, hemoptysis, sputum production, shortness of breath and wheezing.   Cardiovascular: Negative for chest pain, palpitations, orthopnea and claudication.  Gastrointestinal: Negative for abdominal pain, blood in stool, constipation, diarrhea, heartburn, melena, nausea and vomiting.       Hiccups  Genitourinary: Negative for dysuria, flank pain, frequency, hematuria and urgency.  Musculoskeletal: Negative for back pain, joint pain and myalgias.  Skin: Negative for rash.  Neurological: Positive for weakness. Negative for dizziness, tingling, focal weakness, seizures and headaches.  Endo/Heme/Allergies: Does not bruise/bleed easily.  Psychiatric/Behavioral: Negative for depression and suicidal ideas. The patient does not have insomnia.     Allergies  Allergen Reactions  . Oxycodone-Acetaminophen Other (See Comments)    Reaction: paranoia, hallucinations    Patient Active Problem List   Diagnosis Date Noted  . Complication of vascular access for dialysis 04/07/2017  . Spermatic cord inflammation 02/24/2017  . Thrombocytopenia ([place]) 02/14/2017  . Chemotherapy-induced neutropenia ([place]) 02/14/2017  . Malignant neoplasm metastatic to lung ([place]) 02/03/2017  . 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 Diffuse large B-cell lymphoma of extranodal site excluding spleen and other solid organs ([place]) 01/30/2017  . Encounter for antineoplastic immunotherapy 09/03/2016  . Encounter for anticoagulation discussion and counseling 08/31/2016  . Acute upper GI bleed   . Cancer ([place])   . Palliative care by specialist   . DNR (do not resuscitate) discussion   . Goals of care, counseling/discussion   . GIB (gastrointestinal bleeding) 07/08/2016  . Encounter for antineoplastic chemotherapy 07/07/2016  . Gastroenteritis 06/25/2016  . Multiple thyroid nodules 06/06/2016  . Liver metastases ([place]) 05/26/2016  . Bone metastasis ([place]) 05/26/2016  . Non-intractable vomiting  with nausea   . Diarrhea   . Neutropenic fever ([place]) 05/18/2016  .  Carotid atherosclerosis, bilateral 05/09/2016  . Protein-calorie malnutrition, severe 05/08/2016  . Hypercalcemia 05/06/2016  . Renal transplant recipient 05/06/2016  . Post-transplant lymphoproliferative disorder ([place]) 05/06/2016  . Tumor lysis syndrome 05/06/2016  . High serum parathyroid hormone (PTH) 05/03/2016  . Pain in joint involving pelvic region and thigh 05/03/2016  . Diffuse large B cell lymphoma ([place]) 04/30/2016  . Abnormal positron emission tomography (PET) scan   . Anemia 04/29/2016  . Lymphoma ([place]) 04/28/2016  . Pneumothorax 04/28/2016  . Hyponatremia 04/28/2016  . Adenopathy 04/15/2016  . Bone disease 04/15/2016  . Weight loss 04/15/2016  . Spinal stenosis of cervical region 11/21/2015  . Pleural effusion, right 11/21/2015  . Right inguinal pain 10/19/2015  . Chronic headache 10/19/2015  . Hip strain 10/09/2015  . BPH with obstruction/lower urinary tract symptoms 09/03/2015  . Prostatitis, chronic 09/03/2015  . Erectile dysfunction of organic origin 09/03/2015  . Difficulty urinating 08/14/2015  . Accumulation of fluid in tissues 07/18/2015  . Persistent atrial fibrillation ([place])   . Pain in finger of right hand 06/15/2015  . Eustachian tube dysfunction, left 06/06/2015  . Chronic insomnia 06/06/2015  . H/O: upper GI bleed 06/06/2015  . Pleural cavity effusion 04/07/2015  . Abdominal pain, generalized 04/04/2015  . Pneumonia due to Haemophilus influenzae ([place]) 03/16/2015  . Encounter for therapeutic drug monitoring 07/27/2013  . Long term (current) use of anticoagulants 09/25/2010  . Hyperlipidemia 10/25/2008  . Essential hypertension 10/25/2008  . ATRIAL FIBRILLATION 10/25/2008  . GERD 10/25/2008  . RENAL FAILURE, END STAGE 10/25/2008  . Osteoarthrosis, unspecified whether generalized or localized, involving lower leg 10/25/2008  . BENIGN PROSTATIC HYPERTROPHY, HX OF 10/25/2008      Past Medical History:  Diagnosis Date  . Benign prostatic hypertrophy   . Chronic headache 10/19/2015  . Dysrhythmia   . ED (erectile dysfunction)   . End stage renal disease ([place])   . Essential hypertension   . GERD (gastroesophageal reflux disease)   . GIB (gastrointestinal bleeding)    a. 09/979 s/p R colic artery embolization;  b. 02/2015 EGD: duod ulcerative mass->Bx notable for coagulative necrosis - ? ischemia vs thrombosis-->coumadin d/c'd.  . Gout   . Hearing loss   . Hemorrhoids   . Hyperlipidemia   . Lymphoma ([place])   . Lymphoma ([place]) 2017  . Multiple thyroid nodules 06/06/2016   Noted on carotid US; dedicated US to be ordered by staff  . Osteoarthrosis, unspecified whether generalized or localized, lower leg   . Persistent atrial fibrillation ([place])    a. CHA2DS2VASc = 3-->coumadin d/c'd 02/2015 2/2 recurrent GIB.  [name] Pneumonia    2016  . Prostatitis   . Pulmonary hypertension ([place])    a. 10/2014 Echo: EF 60-65%, mild to mod MR, mildly dil LA, nl RV, PASP 2mmHg.  [name] Renal transplant recipient   . Ulcers of both great toes [facility])      Past Surgical History:  Procedure Laterality Date  . AV FISTULA PLACEMENT  1998  . BACK SURGERY    . ESOPHAGOGASTRODUODENOSCOPY  03/13/15   severe esophagitis, ulcerated mass  . ESOPHAGOGASTRODUODENOSCOPY (EGD) WITH PROPOFOL N/A 07/09/2016   Procedure: ESOPHAGOGASTRODUODENOSCOPY (EGD) WITH PROPOFOL;  Surgeon: [name], MD;  Location: [facility] ENDOSCOPY;  Service: Endoscopy;  Laterality: N/A;  . HERNIA REPAIR  1974  . KIDNEY TRANSPLANT  2006  . LIGATION OF ARTERIOVENOUS  FISTULA Right 06/10/2017   Procedure: LIGATION OF ARTERIOVENOUS  FISTULA;  Surgeon: Algernon Huxley, MD;  Location: [facility] ORS;  Service: Vascular;  Laterality: Right;  . PERIPHERAL VASCULAR CATHETERIZATION N/A 05/07/2016   Procedure: Glori Luis Cath Insertion;  Surgeon: Algernon Huxley, MD;  Location: [facility];  Service: Cardiovascular;  Laterality: N/A;  . PROSTATE  ABLATION    . STOMACH SURGERY     blood vessel burst  . THROAT SURGERY    . TOTAL KNEE ARTHROPLASTY      Social History   Socioeconomic History  . Marital status: Divorced    Spouse name: Not on file  . Number of children: Not on file  . Years of education: Not on file  . Highest education level: Not on file  Social Needs  . Financial resource strain: Not on file  . Food insecurity - worry: Not on file  . Food insecurity - inability: Not on file  . Transportation needs - medical: Not on file  . Transportation needs - non-medical: Not on file  Occupational History  . Occupation: Retired    Fish farm manager: RETIRED  Tobacco Use  . Smoking status: Former Smoker    Packs/day: 1.00    Years: 25.00    Pack years: 25.00    Types: Cigarettes    Last attempt to quit: 06/23/1978    Years since quitting: 39.0  . Smokeless tobacco: Never Used  Substance and Sexual Activity  . Alcohol use: No  . Drug use: No  . Sexual activity: Yes  Other Topics Concern  . Not on file  Social History Narrative   Divorced   Does not get regular exercise     Family History  Problem Relation Age of Onset  . Cancer Mother        throat  . Diabetes Brother   . Heart disease Brother   . Stroke Brother   . Hypertension Brother   . Diabetes Sister   . Heart disease Sister   . Hypertension Sister   . Diabetes Sister   . Diabetes Brother   . COPD Neg Hx   . Kidney disease Neg Hx   . Prostate cancer Neg Hx   . Kidney cancer Neg Hx   . Bladder Cancer Neg Hx      Current Facility-Administered Medications:  .  0.9 %  sodium chloride infusion, , Intravenous, Continuous, Wieting, Richard, MD, Last Rate: 40 mL/hr at 07/03/17 2157 .  acetaminophen (TYLENOL) tablet 650 mg, 650 mg, Oral, Q6H PRN, 650 mg at 07/04/17 1007 **OR** acetaminophen (TYLENOL) suppository 650 mg, 650 mg, Rectal, Q6H PRN, Wieting, Richard, MD .  albuterol (PROVENTIL) (2.5 MG/3ML) 0.083% nebulizer solution 2.5 mg, 2.5 mg, Inhalation,  Q4H PRN, Wieting, Richard, MD .  allopurinol (ZYLOPRIM) tablet 200 mg, 200 mg, Oral, Daily, Leslye Peer, Richard, MD, 200 mg at 07/04/17 0947 .  colchicine tablet 0.6 mg, 0.6 mg, Oral, BID PRN, Leslye Peer, Richard, MD .  enoxaparin (LOVENOX) injection 40 mg, 40 mg, Subcutaneous, Q24H, Wieting, Richard, MD .  finasteride (PROSCAR) tablet 5 mg, 5 mg, Oral, Daily, Leslye Peer, Richard, MD, 5 mg at 07/04/17 0947 .  megestrol (MEGACE) 400 MG/10ML suspension 200 mg, 200 mg, Oral, Daily, Leslye Peer, Richard, MD, 200 mg at 07/04/17 0946 .  metoprolol tartrate (LOPRESSOR) tablet 12.5 mg, 12.5 mg, Oral, BID, Leslye Peer, Richard, MD, 12.5 mg at 07/04/17 0947 .  multivitamin with minerals tablet 1 tablet, 1 tablet, Oral, Daily, Loletha Grayer, MD, 1 tablet at 07/04/17 0947 .  ondansetron (ZOFRAN) tablet 4 mg, 4 mg, Oral, Q6H PRN, Wieting, Richard, MD .  pantoprazole (PROTONIX) EC tablet 40 mg, 40 mg,  Oral, BID, Loletha Grayer, MD, 40 mg at 07/04/17 0947 .  polyvinyl alcohol (LIQUIFILM TEARS) 1.4 % ophthalmic solution 1 drop, 1 drop, Both Eyes, PRN, Wieting, Richard, MD .  predniSONE (DELTASONE) tablet 5 mg, 5 mg, Oral, Daily, Leslye Peer, Richard, MD, 5 mg at 07/04/17 0948 .  sucralfate (CARAFATE) tablet 1 g, 1 g, Oral, QID PRN, Wieting, Richard, MD .  tacrolimus (PROGRAF) capsule 3 mg, 3 mg, Oral, BID, Leslye Peer, Richard, MD, 3 mg at 07/04/17 0947 .  tamsulosin (FLOMAX) capsule 0.4 mg, 0.4 mg, Oral, Daily, Leslye Peer, Richard, MD, 0.4 mg at 07/04/17 0623  Facility-Administered Medications Ordered in Other Encounters:  .  heparin lock flush 100 unit/mL, 500 Units, Intravenous, Once, Corcoran, Melissa C, MD .  heparin lock flush 100 unit/mL, 500 Units, Intravenous, Once, Corcoran, Melissa C, MD .  heparin lock flush 100 unit/mL, 500 Units, Intravenous, Once, Corcoran, Melissa C, MD .  sodium chloride flush (NS) 0.9 % injection 10 mL, 10 mL, Intracatheter, PRN, Corcoran, Melissa C, MD .  sodium chloride flush (NS) 0.9 % injection  10 mL, 10 mL, Intravenous, PRN, Corcoran, Melissa C, MD .  sodium chloride flush (NS) 0.9 % injection 10 mL, 10 mL, Intravenous, Once, Lequita Asal, MD   Physical exam:  Vitals:   07/03/17 2030 07/03/17 2128 07/03/17 2133 07/04/17 0502  BP: (!) 164/57  (!) 190/75 (!) 155/84  Pulse:   (!) 101 74  Resp: 12  16 19   Temp:   (!) 97.5 F (36.4 C) 97.6 F (36.4 C)  TempSrc:      SpO2:   100% 100%  Weight:  107 lb 12.8 oz (48.9 kg)    Height:  5\' 5"  (1.651 m)     Physical Exam  Constitutional: He is oriented to person, place, and time.  Thin elderly man in no acute distress. AAO X3  HENT:  Head: Normocephalic and atraumatic.  edentulous  Eyes: EOM are normal. Pupils are equal, round, and reactive to light.  Neck: Normal range of motion.  Cardiovascular: Normal rate, regular rhythm and normal heart sounds.  Pulmonary/Chest: Effort normal and breath sounds normal.  Abdominal: Soft. Bowel sounds are normal.  Neurological: He is alert and oriented to person, place, and time.  Skin: Skin is warm and dry.       CMP Latest Ref Rng & Units 07/04/2017  Glucose 65 - 99 mg/dL 67  BUN 6 - 20 mg/dL 23(H)  Creatinine 0.61 - 1.24 mg/dL 1.48(H)  Sodium 135 - 145 mmol/L 130(L)  Potassium 3.5 - 5.1 mmol/L 5.3(H)  Chloride 101 - 111 mmol/L 102  CO2 22 - 32 mmol/L 20(L)  Calcium 8.9 - 10.3 mg/dL 10.2  Total Protein 6.5 - 8.1 g/dL -  Total Bilirubin 0.3 - 1.2 mg/dL -  Alkaline Phos 38 - 126 U/L -  AST 15 - 41 U/L -  ALT 17 - 63 U/L -   CBC Latest Ref Rng & Units 07/04/2017  WBC 3.8 - 10.6 K/uL 2.2(L)  Hemoglobin 13.0 - 18.0 g/dL 9.5(L)  Hematocrit 40.0 - 52.0 % 28.2(L)  Platelets 150 - 440 K/uL 120(L)    @IMAGES @  Dg Chest 2 View  Result Date: 07/03/2017 CLINICAL DATA:  Back pain and hiccups. EXAM: CHEST  2 VIEW COMPARISON:  07/08/2016 and PET-CT from 01/14/2017 FINDINGS: Right chest wall port a catheter is identified with tip in the cavoatrial junction. There is mild cardiac  enlargement and aortic atherosclerosis. Diffuse bilateral hazy lung opacities are identified. No  lobar consolidation. Left lung nodule is again noted measuring 1.3 cm. IMPRESSION: 1. Diffuse bilateral hazy lung opacities compatible with pulmonary edema versus atypical infection/inflammation. Electronically Signed   By: Kerby Moors M.D.   On: 07/03/2017 15:57   Dg Lumbar Spine Complete  Result Date: 07/03/2017 CLINICAL DATA:  Low back pain, left hip and knee pain. EXAM: LUMBAR SPINE - COMPLETE 4+ VIEW COMPARISON:  CT 03/08/2017 FINDINGS: Mild thoracolumbar curvature convex to the left. Previous fusion from L3 to the sacrum appears solid. Posterior hardware intact. Degenerative disc disease in the lower thoracic region and more pronounced at L1-2 and L2-3. No evidence of fracture or other focal lesion. L1-2 and L2-3 findings could certainly be associated with back pain. IMPRESSION: Distant solid fusion from L3 to the sacrum. Adjacent segment degenerative changes at L1-2 and L2-3 that could be symptomatic. Electronically Signed   By: Nelson Chimes M.D.   On: 07/03/2017 15:59   Dg Knee Complete 4 Views Left  Result Date: 07/03/2017 CLINICAL DATA:  Left knee pain. EXAM: LEFT KNEE - COMPLETE 4+ VIEW COMPARISON:  None. FINDINGS: No evidence of fracture, dislocation, or joint effusion. No evidence of arthropathy or other focal bone abnormality. Soft tissues are unremarkable. Insignificant age related chondrocalcinosis. Vascular calcification. IMPRESSION: Negative. Electronically Signed   By: Nelson Chimes M.D.   On: 07/03/2017 16:00   Dg Hip Unilat W Or Wo Pelvis 2-3 Views Left  Result Date: 07/03/2017 CLINICAL DATA:  Low back and left hip pain. EXAM: DG HIP (WITH OR WITHOUT PELVIS) 2-3V LEFT COMPARISON:  None. FINDINGS: Osteoarthritis of both hips, worse on the right than the left considerably. No other pelvic bone finding. Previous lumbosacral fusion. Ordinary sacroiliac osteoarthritis. IMPRESSION: Mild  osteoarthritis of the left hip. No acute or focal finding. Advanced osteoarthritis of the right hip. Sacroiliac osteoarthritis. Electronically Signed   By: Nelson Chimes M.D.   On: 07/03/2017 15:59    Assessment and plan- Patient is a 77 y.o. male with refractory DLBCL/ PTLD admitted for hallucinations likely opioid induced but metabolic encephalopathy is also a consideration given hyponatremia  1. Hyponatremia- improved after IV fluids. Sodium still 130 today. Continue IVF  2. Mild hyperkalemia- K 5.3 today. Continue to monitor  3. Pancytopenia- chronic since October. Secondary to lymphoma. No need for neupogen. Continue to monitor.  4. Stage IV lymphoma- overall poor prognosis. Would recmmend f/u with Dr. Dittus/ Dr. Mike Gip upon discharge and discuss goals of care  5. AKI- continue IVF  6. Hallucinations- possible opioid induced versus metabolic from hyponatremia. Oxycodone and baclofen has been stopped. Mental status has improved  7. Hiccups- can try low dose thorazine 12.5 mg prn PO    Visit Diagnosis 1. Hyponatremia   2. Hiccups     Dr. Randa Evens, MD, MPH [facility] Pager- [phone] 07/04/2017 4:46 PM

## 2017-07-05 LAB — RENAL FUNCTION PANEL
ANION GAP: 7 (ref 5–15)
Albumin: 3.2 g/dL — ABNORMAL LOW (ref 3.5–5.0)
BUN: 23 mg/dL — ABNORMAL HIGH (ref 6–20)
CALCIUM: 9.7 mg/dL (ref 8.9–10.3)
CO2: 26 mmol/L (ref 22–32)
Chloride: 99 mmol/L — ABNORMAL LOW (ref 101–111)
Creatinine, Ser: 1.52 mg/dL — ABNORMAL HIGH (ref 0.61–1.24)
GFR calc Af Amer: 49 mL/min — ABNORMAL LOW (ref >60)
GFR calc non Af Amer: 42 mL/min — ABNORMAL LOW (ref >60)
GLUCOSE: 121 mg/dL — AB (ref 65–99)
Phosphorus: 1.7 mg/dL — ABNORMAL LOW (ref 2.5–4.6)
Potassium: 4.6 mmol/L (ref 3.5–5.1)
SODIUM: 132 mmol/L — AB (ref 135–145)

## 2017-07-05 LAB — NA AND K (SODIUM & POTASSIUM), RAND UR
Potassium Urine: 13 mmol/L
Sodium, Ur: 93 mmol/L

## 2017-07-05 MED ORDER — SODIUM POLYSTYRENE SULFONATE 15 GM/60ML PO SUSP
15.0000 g | Freq: Once | ORAL | Status: AC
  Administered 2017-07-05: 15 g via ORAL
  Filled 2017-07-05: qty 60

## 2017-07-05 MED ORDER — FUROSEMIDE 10 MG/ML IJ SOLN
40.0000 mg | Freq: Once | INTRAMUSCULAR | Status: AC
  Administered 2017-07-05: 40 mg via INTRAVENOUS
  Filled 2017-07-05: qty 4

## 2017-07-05 MED ORDER — KETOROLAC TROMETHAMINE 15 MG/ML IJ SOLN
7.5000 mg | Freq: Three times a day (TID) | INTRAMUSCULAR | Status: DC | PRN
  Administered 2017-07-05: 7.5 mg via INTRAVENOUS
  Filled 2017-07-05: qty 1

## 2017-07-05 MED ORDER — HEPARIN SOD (PORK) LOCK FLUSH 100 UNIT/ML IV SOLN
500.0000 [IU] | Freq: Once | INTRAVENOUS | Status: AC
  Administered 2017-07-05: 500 [IU] via INTRAVENOUS
  Filled 2017-07-05: qty 5

## 2017-07-05 MED ORDER — MEGESTROL ACETATE 400 MG/10ML PO SUSP: 800.0000 mg | Freq: Every day | ORAL | 0 refills | Status: AC

## 2017-07-05 MED ORDER — SODIUM CHLORIDE 0.9 % IV SOLN
12.5000 mg | Freq: Four times a day (QID) | INTRAVENOUS | Status: DC | PRN
  Administered 2017-07-05: 12.5 mg via INTRAVENOUS
  Filled 2017-07-05: qty 0.5

## 2017-07-05 MED ORDER — MIRTAZAPINE 15 MG PO TABS: 15.0000 mg | ORAL_TABLET | Freq: Every day | ORAL | 0 refills | Status: AC

## 2017-07-05 MED ORDER — SODIUM CHLORIDE 0.9 % IV BOLUS (SEPSIS)
500.0000 mL | Freq: Once | INTRAVENOUS | Status: AC
  Administered 2017-07-05: 500 mL via INTRAVENOUS

## 2017-07-05 NOTE — Progress Notes (Signed)
Called MD Marcille Blanco  regarding 30 beat run of V-tach, hiccups and generalized pain. See new orders

## 2017-07-05 NOTE — Discharge Summary (Signed)
[name] [place], 77 y.o., DOB August 10, 1940, MRN [id number]. Admission date: 07/03/2017 Discharge Date 07/05/2017 Primary MD Arnetha Courser, MD Admitting Physician Loletha Grayer, MD  Admission Diagnosis  Hyponatremia [E87.1] Hiccups [R06.6]  Discharge Diagnosis   Active Problems: Acute renal failure on chronic kidney disease stage III Acute encephalopathy with hallucination Hyponatremia Hyperkalemia Metabolic acidosis History of atrial fibrillation Status post kidney transfer BPH [name]  is a 77 y.o. male with a known history of lymphoma, hypertension, atrial fibrillation and kidney transplant.  He presents with hallucinations with oxycodone and baclofen.  He is also having hiccups and pain in his legs.  Patient was noted to be dehydrated and was admitted for IV fluids..   Patient's sodium improved with IV fluids.  He is on HCTZ which has been discontinued.  Patient also has had lymphoma and last PET scan showed progression of the disease.  He has a follow-up with his primary oncologist at [facility] which he needs to follow-up with.  He is feeling better.  His Megace has been increased and he started on Remeron for improved appetite.  His prognosis is very poor and likely progression of the lymphoma.              Consults  nephrology  Significant Tests:  See full reports for all details    Dg Chest 2 View  Result Date: 07/03/2017 CLINICAL DATA:  Back pain and hiccups. EXAM: CHEST  2 VIEW COMPARISON:  07/08/2016 and PET-CT from 01/14/2017 FINDINGS: Right chest wall port a catheter is identified with tip in the cavoatrial junction. There is mild cardiac enlargement and aortic atherosclerosis. Diffuse bilateral hazy lung opacities are identified. No lobar consolidation. Left lung nodule is again noted measuring 1.3 cm. IMPRESSION: 1. Diffuse bilateral hazy lung opacities compatible with pulmonary edema  versus atypical infection/inflammation. Electronically Signed   By: Kerby Moors M.D.   On: 07/03/2017 15:57   Dg Lumbar Spine Complete  Result Date: 07/03/2017 CLINICAL DATA:  Low back pain, left hip and knee pain. EXAM: LUMBAR SPINE - COMPLETE 4+ VIEW COMPARISON:  CT 03/08/2017 FINDINGS: Mild thoracolumbar curvature convex to the left. Previous fusion from L3 to the sacrum appears solid. Posterior hardware intact. Degenerative disc disease in the lower thoracic region and more pronounced at L1-2 and L2-3. No evidence of fracture or other focal lesion. L1-2 and L2-3 findings could certainly be associated with back pain. IMPRESSION: Distant solid fusion from L3 to the sacrum. Adjacent segment degenerative changes at L1-2 and L2-3 that could be symptomatic. Electronically Signed   By: Nelson Chimes M.D.   On: 07/03/2017 15:59   Dg Knee Complete 4 Views Left  Result Date: 07/03/2017 CLINICAL DATA:  Left knee pain. EXAM: LEFT KNEE - COMPLETE 4+ VIEW COMPARISON:  None. FINDINGS: No evidence of fracture, dislocation, or joint effusion. No evidence of arthropathy or other focal bone abnormality. Soft tissues are unremarkable. Insignificant age related chondrocalcinosis. Vascular calcification. IMPRESSION: Negative. Electronically Signed   By: Nelson Chimes M.D.   On: 07/03/2017 16:00   Dg Hip Unilat W Or Wo Pelvis 2-3 Views Left  Result Date: 07/03/2017 CLINICAL DATA:  Low back and left hip pain. EXAM: DG HIP (WITH OR WITHOUT PELVIS) 2-3V LEFT COMPARISON:  None. FINDINGS: Osteoarthritis of both hips, worse on the right than the left considerably. No other pelvic bone finding. Previous lumbosacral fusion. Ordinary sacroiliac osteoarthritis. IMPRESSION: Mild osteoarthritis  of the left hip. No acute or focal finding. Advanced osteoarthritis of the right hip. Sacroiliac osteoarthritis. Electronically Signed   By: Nelson Chimes M.D.   On: 07/03/2017 15:59       Today   Subjective:   Matthew Brown patient  feeling better more stronger Objective:   Blood pressure 139/64, pulse 80, temperature 99.7 F (37.6 C), temperature source Oral, resp. rate 20, height 5\' 5"  (1.651 m), weight 107 lb 12.8 oz (48.9 kg), SpO2 100 %.  .  Intake/Output Summary (Last 24 hours) at 07/05/2017 1323 Last data filed at 07/05/2017 0730 Gross per 24 hour  Intake 1990 ml  Output 0 ml  Net 1990 ml    Exam VITAL SIGNS: Blood pressure 139/64, pulse 80, temperature 99.7 F (37.6 C), temperature source Oral, resp. rate 20, height 5\' 5"  (1.651 m), weight 107 lb 12.8 oz (48.9 kg), SpO2 100 %.  GENERAL:  77 y.o.-year-old patient lying in the bed with no acute distress.  EYES: Pupils equal, round, reactive to light and accommodation. No scleral icterus. Extraocular muscles intact.  HEENT: Head atraumatic, normocephalic. Oropharynx and nasopharynx clear.  NECK:  Supple, no jugular venous distention. No thyroid enlargement, no tenderness.  LUNGS: Normal breath sounds bilaterally, no wheezing, rales,rhonchi or crepitation. No use of accessory muscles of respiration.  CARDIOVASCULAR: S1, S2 normal. No murmurs, rubs, or gallops.  ABDOMEN: Soft, nontender, nondistended. Bowel sounds present. No organomegaly or mass.  EXTREMITIES: No pedal edema, cyanosis, or clubbing.  NEUROLOGIC: Cranial nerves II through XII are intact. Muscle strength 5/5 in all extremities. Sensation intact. Gait not checked.  PSYCHIATRIC: The patient is alert and oriented x 3.  SKIN: No obvious rash, lesion, or ulcer.   Data Review     CBC w Diff:  Lab Results  Component Value Date   WBC 2.2 (L) 07/04/2017   HGB 9.5 (L) 07/04/2017   HGB 9.3 (L) 05/06/2016   HCT 28.2 (L) 07/04/2017   PLT 120 (L) 07/04/2017   LYMPHOPCT 7 07/03/2017   BANDSPCT 0 06/09/2017   MONOPCT 13 07/03/2017   EOSPCT 0 07/03/2017   BASOPCT 0 07/03/2017   CMP:  Lab Results  Component Value Date   NA 132 (L) 07/05/2017   K 4.6 07/05/2017   CL 99 (L) 07/05/2017   CO2 26  07/05/2017   BUN 23 (H) 07/05/2017   CREATININE 1.52 (H) 07/05/2017   PROT 6.4 (L) 07/03/2017   ALBUMIN 3.2 (L) 07/05/2017   BILITOT 1.1 07/03/2017   ALKPHOS 80 07/03/2017   AST 32 07/03/2017   ALT 8 (L) 07/03/2017  .  Micro Results No results found for this or any previous visit (from the past 240 hour(s)).      Code Status Orders  (From admission, onward)        Start     Ordered   07/03/17 2040  Full code  Continuous     07/03/17 2040    Code Status History    Date Active Date Inactive Code Status Order ID Comments User Context   07/08/2016 19:01 07/14/2016 00:52 Full Code [id number]  Bettey Costa, MD Inpatient   06/26/2016 01:52 06/28/2016 17:29 Full Code [id number]  Harvie Bridge, DO Inpatient   05/18/2016 15:45 05/22/2016 21:09 Full Code [id number]  Demetrios Loll, MD Inpatient   05/06/2016 16:23 05/13/2016 23:03 Full Code [id number]  Dustin Flock, MD Inpatient   04/29/2016 01:24 04/30/2016 21:35 Full Code [id number]  Lance Coon, MD Inpatient    Advance Directive Documentation  Most Recent Value  Type of Advance Directive  Healthcare Power of Attorney  Pre-existing out of facility DNR order (yellow form or pink MOST form)  No data  "MOST" Form in Place?  No data          Follow-up Information    Lada, Satira Anis, MD Follow up in 1 week(s).   Specialty:  Family Medicine Why:  hosp f/u Contact information: [place] [address] [phone]           Discharge Medications   Allergies as of 07/05/2017      Reactions   Oxycodone-acetaminophen Other (See Comments)   Reaction: paranoia, hallucinations      Medication List    STOP taking these medications   hydrochlorothiazide 25 MG tablet Commonly known as:  HYDRODIURIL     TAKE these medications   acetaminophen 325 MG tablet Commonly known as:  TYLENOL Take 2 tablets (650 mg total) by mouth every 6 (six) hours as needed for mild pain (or Fever >/= 101).   allopurinol 100 MG  tablet Commonly known as:  ZYLOPRIM Take 2 tablets (200 mg total) by mouth daily.   COLCRYS 0.6 MG tablet Generic drug:  colchicine Take 1 tablet by mouth 2 (two) times daily as needed (gout).   finasteride 5 MG tablet Commonly known as:  PROSCAR Take 1 tablet (5 mg total) by mouth daily.   hydroxypropyl methylcellulose 2.5 % ophthalmic solution Commonly known as:  ISOPTO TEARS Place 1 drop into both eyes as needed (dry eyes).   lenalidomide 5 MG capsule Commonly known as:  REVLIMID Take 1 capsule (5 mg total) daily by mouth. for 21 days then off 7 days. Do NOT start until instructed by MD   megestrol 400 MG/10ML suspension Commonly known as:  MEGACE Take 20 mLs (800 mg total) by mouth daily. What changed:  how much to take   metoprolol tartrate 25 MG tablet Commonly known as:  LOPRESSOR Take 12.5 mg by mouth 2 (two) times daily.   mirtazapine 15 MG tablet Commonly known as:  REMERON Take 1 tablet (15 mg total) by mouth at bedtime.   multivitamin tablet Take 1 tablet by mouth daily.   ondansetron 4 MG tablet Commonly known as:  ZOFRAN Take 1 tablet (4 mg total) by mouth every 6 (six) hours as needed for nausea.   oxybutynin 5 MG 24 hr tablet Commonly known as:  DITROPAN-XL Take 1 tablet (5 mg total) by mouth daily.   pantoprazole 40 MG tablet Commonly known as:  PROTONIX Take 1 tablet (40 mg total) by mouth 2 (two) times daily.   predniSONE 5 MG tablet Commonly known as:  DELTASONE Take 5 mg by mouth daily.   PROAIR HFA 108 (90 Base) MCG/ACT inhaler Generic drug:  albuterol Inhale 1-2 puffs into the lungs every 4 (four) hours as needed for wheezing or shortness of breath.   simethicone 80 MG chewable tablet Commonly known as:  MYLICON Chew 1 tablet (80 mg total) by mouth every 6 (six) hours as needed for flatulence.   sucralfate 1 g tablet Commonly known as:  CARAFATE Take 1 tablet (1 g total) by mouth 4 (four) times daily. Resume taking after one week-  once finished taking oral levaquine. ( to avoid interaction.) What changed:    when to take this  reasons to take this  additional instructions   tacrolimus 1 MG capsule Commonly known as:  PROGRAF Take 3 mg by mouth 2 (two)  times daily. Reported on 08/16/2015   tamsulosin 0.4 MG Caps capsule Commonly known as:  FLOMAX Take 1 capsule (0.4 mg total) by mouth daily.          Total Time in preparing paper work, data evaluation and todays exam - 35 minutes  Dustin Flock M.D on 07/05/2017 at 1:23 PM  [facility]   Office  [phone]

## 2017-07-05 NOTE — Care Management Note (Signed)
Case Management Note  Patient Details  Name: Matthew Brown MRN: [id number] Date of Birth: 03-19-1941  Subjective/Objective:       Discussed discharge planning with [name] and his [name]. [name] refused offfers of home health. He states he will contact his PCP if he changes his mind after he gets home.              Action/Plan:   Expected Discharge Date:  07/05/17               Expected Discharge Plan:     In-House Referral:     Discharge planning Services     Post Acute Care Choice:    Choice offered to:     DME Arranged:    DME Agency:     HH Arranged:    HH Agency:     Status of Service:     If discussed at [employer], dates discussed:    Additional Comments:  Shabazz Mckey A, RN 07/05/2017, 12:40 PM

## 2017-07-05 NOTE — Progress Notes (Signed)
Matthew Brown to be D/C'd Home per MD order.  Discussed prescriptions and follow up appointments with the patient. Prescriptions given to patient, medication list explained in detail. Pt verbalized understanding.  Allergies as of 07/05/2017      Reactions   Oxycodone-acetaminophen Other (See Comments)   Reaction: paranoia, hallucinations      Medication List    STOP taking these medications   hydrochlorothiazide 25 MG tablet Commonly known as:  HYDRODIURIL     TAKE these medications   acetaminophen 325 MG tablet Commonly known as:  TYLENOL Take 2 tablets (650 mg total) by mouth every 6 (six) hours as needed for mild pain (or Fever >/= 101).   allopurinol 100 MG tablet Commonly known as:  ZYLOPRIM Take 2 tablets (200 mg total) by mouth daily.   COLCRYS 0.6 MG tablet Generic drug:  colchicine Take 1 tablet by mouth 2 (two) times daily as needed (gout).   finasteride 5 MG tablet Commonly known as:  PROSCAR Take 1 tablet (5 mg total) by mouth daily.   hydroxypropyl methylcellulose 2.5 % ophthalmic solution Commonly known as:  ISOPTO TEARS Place 1 drop into both eyes as needed (dry eyes).   lenalidomide 5 MG capsule Commonly known as:  REVLIMID Take 1 capsule (5 mg total) daily by mouth. for 21 days then off 7 days. Do NOT start until instructed by MD   megestrol 400 MG/10ML suspension Commonly known as:  MEGACE Take 20 mLs (800 mg total) by mouth daily. What changed:  how much to take   metoprolol tartrate 25 MG tablet Commonly known as:  LOPRESSOR Take 12.5 mg by mouth 2 (two) times daily.   mirtazapine 15 MG tablet Commonly known as:  REMERON Take 1 tablet (15 mg total) by mouth at bedtime.   multivitamin tablet Take 1 tablet by mouth daily.   ondansetron 4 MG tablet Commonly known as:  ZOFRAN Take 1 tablet (4 mg total) by mouth every 6 (six) hours as needed for nausea.   oxybutynin 5 MG 24 hr tablet Commonly known as:  DITROPAN-XL Take 1 tablet (5 mg total)  by mouth daily.   pantoprazole 40 MG tablet Commonly known as:  PROTONIX Take 1 tablet (40 mg total) by mouth 2 (two) times daily.   predniSONE 5 MG tablet Commonly known as:  DELTASONE Take 5 mg by mouth daily.   PROAIR HFA 108 (90 Base) MCG/ACT inhaler Generic drug:  albuterol Inhale 1-2 puffs into the lungs every 4 (four) hours as needed for wheezing or shortness of breath.   simethicone 80 MG chewable tablet Commonly known as:  MYLICON Chew 1 tablet (80 mg total) by mouth every 6 (six) hours as needed for flatulence.   sucralfate 1 g tablet Commonly known as:  CARAFATE Take 1 tablet (1 g total) by mouth 4 (four) times daily. Resume taking after one week- once finished taking oral levaquine. ( to avoid interaction.) What changed:    when to take this  reasons to take this  additional instructions   tacrolimus 1 MG capsule Commonly known as:  PROGRAF Take 3 mg by mouth 2 (two) times daily. Reported on 08/16/2015   tamsulosin 0.4 MG Caps capsule Commonly known as:  FLOMAX Take 1 capsule (0.4 mg total) by mouth daily.       Vitals:   07/05/17 0243 07/05/17 0511  BP: (!) 181/82 139/64  Pulse: 73 80  Resp:  20  Temp: 98.1 F (36.7 C) 99.7 F (37.6 C)  SpO2: 98% 100%    Skin clean, dry and intact without evidence of skin break down, no evidence of skin tears noted.port deassessed and flushed with heparin. Site without signs and symptoms of complications. Dressing and pressure applied. Pt denies pain at this time. No complaints noted.  An After Visit Summary was printed and given to the patient. Patient escorted via [place], and D/C home via private auto.  Sharalyn Ink'

## 2017-07-05 NOTE — Progress Notes (Signed)
[facility]  ROUNDING NOTE   Subjective:   Wife at bedside.  Na 132  Sodium bicarb gtt at 106mL/hr  Objective:  Vital signs in last 24 hours:  Temp:  [97.9 F (36.6 C)-99.7 F (37.6 C)] 99.7 F (37.6 C) (01/13 0511) Pulse Rate:  [64-84] 80 (01/13 0511) Resp:  [18-20] 20 (01/13 0511) BP: (135-181)/(62-82) 139/64 (01/13 0511) SpO2:  [98 %-100 %] 100 % (01/13 0511)  Weight change:  Filed Weights   07/03/17 1317 07/03/17 2128  Weight: 52.6 kg (116 lb) 48.9 kg (107 lb 12.8 oz)    Intake/Output: I/O last 3 completed shifts: In: 1323 [I.V.:1273; IV Piggyback:50] Out: 200 [Urine:200]   Intake/Output this shift:  Total I/O In: 753 [I.V.:753] Out: -   Physical Exam: General: NAD, laying in bed  Head: Normocephalic, atraumatic. Moist oral mucosal membranes  Eyes: Anicteric, PERRL  Neck: Supple, trachea midline  Lungs:  Clear to auscultation  Heart: Regular rate and rhythm  Abdomen:  Soft, nontender,   Extremities:  no peripheral edema.  Neurologic: Nonfocal, moving all four extremities  Skin: No lesions  Access: none    Basic Metabolic Panel: Recent Labs  Lab 07/03/17 1508 07/03/17 1819 07/04/17 0629 07/05/17 0628  NA 125* 127* 130* 132*  K 4.9 5.0 5.3* 4.6  CL 96* 100* 102 99*  CO2 20* 18* 20* 26  GLUCOSE 106* 88 67 121*  BUN 25* 23* 23* 23*  CREATININE 1.37* 1.25* 1.48* 1.52*  CALCIUM 10.2 9.4 10.2 9.7  PHOS  --   --   --  1.7*    Liver Function Tests: Recent Labs  Lab 07/03/17 1508 07/05/17 0628  AST 32  --   ALT 8*  --   ALKPHOS 80  --   BILITOT 1.1  --   PROT 6.4*  --   ALBUMIN 3.7 3.2*   No results for input(s): LIPASE, AMYLASE in the last 168 hours. No results for input(s): AMMONIA in the last 168 hours.  CBC: Recent Labs  Lab 07/03/17 1508 07/04/17 0629  WBC 2.1* 2.2*  NEUTROABS 1.7  --   HGB 9.4* 9.5*  HCT 27.8* 28.2*  MCV 81.1 82.4  PLT 128* 120*    Cardiac Enzymes: No results for input(s): CKTOTAL, CKMB,  CKMBINDEX, TROPONINI in the last 168 hours.  BNP: Invalid input(s): POCBNP  CBG: No results for input(s): GLUCAP in the last 168 hours.  Microbiology: Results for orders placed or performed during the hospital encounter of 06/09/17  Surgical pcr screen     Status: None   Collection Time: 06/09/17  1:48 PM  Result Value Ref Range Status   MRSA, PCR NEGATIVE NEGATIVE Final   Staphylococcus aureus NEGATIVE NEGATIVE Final    Comment: (NOTE) The Xpert SA Assay (FDA approved for NASAL specimens in patients 68 years of age and older), is one component of a comprehensive surveillance program. It is not intended to diagnose infection nor to guide or monitor treatment.     Coagulation Studies: No results for input(s): LABPROT, INR in the last 72 hours.  Urinalysis: Recent Labs    07/03/17 1858  COLORURINE YELLOW*  LABSPEC 1.011  PHURINE 6.0  GLUCOSEU NEGATIVE  HGBUR NEGATIVE  BILIRUBINUR NEGATIVE  KETONESUR 5*  PROTEINUR NEGATIVE  NITRITE NEGATIVE  LEUKOCYTESUR NEGATIVE      Imaging: Dg Chest 2 View  Result Date: 07/03/2017 CLINICAL DATA:  Back pain and hiccups. EXAM: CHEST  2 VIEW COMPARISON:  07/08/2016 and PET-CT from 01/14/2017 FINDINGS: Right  chest wall port a catheter is identified with tip in the cavoatrial junction. There is mild cardiac enlargement and aortic atherosclerosis. Diffuse bilateral hazy lung opacities are identified. No lobar consolidation. Left lung nodule is again noted measuring 1.3 cm. IMPRESSION: 1. Diffuse bilateral hazy lung opacities compatible with pulmonary edema versus atypical infection/inflammation. Electronically Signed   By: Kerby Moors M.D.   On: 07/03/2017 15:57   Dg Lumbar Spine Complete  Result Date: 07/03/2017 CLINICAL DATA:  Low back pain, left hip and knee pain. EXAM: LUMBAR SPINE - COMPLETE 4+ VIEW COMPARISON:  CT 03/08/2017 FINDINGS: Mild thoracolumbar curvature convex to the left. Previous fusion from L3 to the sacrum appears  solid. Posterior hardware intact. Degenerative disc disease in the lower thoracic region and more pronounced at L1-2 and L2-3. No evidence of fracture or other focal lesion. L1-2 and L2-3 findings could certainly be associated with back pain. IMPRESSION: Distant solid fusion from L3 to the sacrum. Adjacent segment degenerative changes at L1-2 and L2-3 that could be symptomatic. Electronically Signed   By: Nelson Chimes M.D.   On: 07/03/2017 15:59   Dg Knee Complete 4 Views Left  Result Date: 07/03/2017 CLINICAL DATA:  Left knee pain. EXAM: LEFT KNEE - COMPLETE 4+ VIEW COMPARISON:  None. FINDINGS: No evidence of fracture, dislocation, or joint effusion. No evidence of arthropathy or other focal bone abnormality. Soft tissues are unremarkable. Insignificant age related chondrocalcinosis. Vascular calcification. IMPRESSION: Negative. Electronically Signed   By: Nelson Chimes M.D.   On: 07/03/2017 16:00   Dg Hip Unilat W Or Wo Pelvis 2-3 Views Left  Result Date: 07/03/2017 CLINICAL DATA:  Low back and left hip pain. EXAM: DG HIP (WITH OR WITHOUT PELVIS) 2-3V LEFT COMPARISON:  None. FINDINGS: Osteoarthritis of both hips, worse on the right than the left considerably. No other pelvic bone finding. Previous lumbosacral fusion. Ordinary sacroiliac osteoarthritis. IMPRESSION: Mild osteoarthritis of the left hip. No acute or focal finding. Advanced osteoarthritis of the right hip. Sacroiliac osteoarthritis. Electronically Signed   By: Nelson Chimes M.D.   On: 07/03/2017 15:59     Medications:   . chlorproMAZINE (THORAZINE) IV Stopped (07/05/17 0521)  .  sodium bicarbonate  infusion 1000 mL 75 mL/hr at 07/05/17 0730   . allopurinol  200 mg Oral Daily  . enoxaparin (LOVENOX) injection  30 mg Subcutaneous Q24H  . finasteride  5 mg Oral Daily  . megestrol  800 mg Oral Daily  . metoprolol tartrate  12.5 mg Oral BID  . mirtazapine  15 mg Oral QHS  . multivitamin with minerals  1 tablet Oral Daily  .  pantoprazole  40 mg Oral BID  . predniSONE  5 mg Oral Daily  . tacrolimus  3 mg Oral BID  . tamsulosin  0.4 mg Oral Daily   acetaminophen **OR** acetaminophen, albuterol, chlorproMAZINE (THORAZINE) IV, colchicine, ketorolac, ondansetron, polyvinyl alcohol, sucralfate  Assessment/ Plan:  [name] is a 77 y.o. black male cadaveric renal transplant 2006 with baseline creatinine of 1.6-1.9 followed by [facility], Dr. Suzan Nailer on prograf, myfortic and prednisone. Patient with past medical history of pulmonary hyeprtension, atrial fibrillation, hypertension, anemia, hyperlipidemia, gout, GERD, BPH, history of GI bleed , and diffuse large B cell lymphoma   1. Acute renal failure on chronic kidney disease stage IIIT 2. Hyponatremia 3. Hyperkalemia 4. Metabolic acidosis  Plan Discontinued hydrochlorothiazide Discontinue IV fluids today.       LOS: 2 Joshue Badal 1/13/201911:14 AM

## 2017-07-05 NOTE — Progress Notes (Signed)
Preparing pt for discharge. Discussed with lynn CM about pts home health orders after discharge. She stated that pt and significant other refused and pt is ready for discharge.

## 2017-07-05 NOTE — Discharge Instructions (Signed)
[place] at [place]:  Regular diet  DISCHARGE CONDITION:  Stable  ACTIVITY:  Activity as tolerated  OXYGEN:  Home Oxygen: No.   Oxygen Delivery: room air  DISCHARGE LOCATION:  home    ADDITIONAL DISCHARGE INSTRUCTION: patient should drink pleanty of water   If you experience worsening of your admission symptoms, develop shortness of breath, life threatening emergency, suicidal or homicidal thoughts you must seek medical attention immediately by calling 911 or calling your MD immediately  if symptoms less severe.  You Must read complete instructions/literature along with all the possible adverse reactions/side effects for all the Medicines you take and that have been prescribed to you. Take any new Medicines after you have completely understood and accpet all the possible adverse reactions/side effects.   Please note  You were cared for by a hospitalist during your hospital stay. If you have any questions about your discharge medications or the care you received while you were in the hospital after you are discharged, you can call the unit and asked to speak with the hospitalist on call if the hospitalist that took care of you is not available. Once you are discharged, your primary care physician will handle any further medical issues. Please note that NO REFILLS for any discharge medications will be authorized once you are discharged, as it is imperative that you return to your primary care physician (or establish a relationship with a primary care physician if you do not have one) for your aftercare needs so that they can reassess your need for medications and monitor your lab values.

## 2017-07-06 MED ORDER — ONDANSETRON HCL 8 MG TABLET
ORAL_TABLET | Freq: Three times a day (TID) | ORAL | 2 refills | 0 days | Status: CP | PRN
Start: 2017-07-06 — End: ?

## 2017-07-06 MED ORDER — PROCHLORPERAZINE MALEATE 10 MG TABLET
ORAL_TABLET | Freq: Four times a day (QID) | ORAL | 2 refills | 0 days | Status: CP | PRN
Start: 2017-07-06 — End: ?

## 2017-07-06 MED ORDER — ASPIRIN 81 MG CHEWABLE TABLET
ORAL_TABLET | Freq: Every day | ORAL | prn refills | 0 days | Status: CP
Start: 2017-07-06 — End: 2017-07-27

## 2017-07-07 NOTE — Unmapped (Signed)
A[facility] Triage Note     Patient: KENYADA HY     Reason for call: urgent page c/o SOB, hiccups, pain  Time call returned:[phone]    Phone Assessment: Spoke with Kandice Hams reports 7/10 pain when he has hiccups, which bring on SOB .  They report  are from 30-1hr at a time and have continued since he was d/c'd Sunday.  Isidor has taken 2T tylenol this AM, no oxycodone, had been told to stop this.  Triage Recommendations: recommend Ayuub be seen at local ED d/t continued SOB.  They are hesitant about going to ED.   Patient Response: Virat reports his hiccups are not as bad as they were and he wanted to be seen by his orthopedist. Vincente Liberty stated she would call back with what they would do    Outstanding tasks: Please see message, instructed to go to local ED     Patient Pharmacy has been verified and primary pharmacy has been marked as preferred

## 2017-07-07 NOTE — Unmapped (Signed)
Hi,    The patient's wife Taylor Ramos has contacted the [facility] with report of intolerable pain and shortness of breath due to hiccups.  We have sent a page to the Oncology Triage Pager per our urgent page guidelines.    Thank you,  Drema Balzarine  [facility] Communication Center  [phone]

## 2017-07-07 NOTE — Unmapped (Signed)
Attempted to reach patient. Unable to reach. Unable to leave voicemail.  Rodman Pickle, RN

## 2017-07-10 ENCOUNTER — Ambulatory Visit: Admit: 2017-07-10 | Discharge: 2017-07-11 | Payer: MEDICARE

## 2017-07-10 ENCOUNTER — Other Ambulatory Visit: Admit: 2017-07-10 | Discharge: 2017-07-11 | Payer: MEDICARE

## 2017-07-10 DIAGNOSIS — C8336 Diffuse large B-cell lymphoma, intrapelvic lymph nodes: Principal | ICD-10-CM

## 2017-07-10 DIAGNOSIS — I4891 Unspecified atrial fibrillation: Secondary | ICD-10-CM

## 2017-07-10 LAB — MEAN CORPUSCULAR HEMOGLOBIN CONC: Lab: 34

## 2017-07-10 LAB — CBC W/ AUTO DIFF
EOSINOPHILS ABSOLUTE COUNT: 0 10*9/L (ref 0.0–0.4)
HEMOGLOBIN: 8.9 g/dL — ABNORMAL LOW (ref 13.5–17.5)
LARGE UNSTAINED CELLS: 2 % (ref 0–4)
LYMPHOCYTES ABSOLUTE COUNT: 0.3 10*9/L — ABNORMAL LOW (ref 1.5–5.0)
MEAN CORPUSCULAR HEMOGLOBIN CONC: 34 g/dL (ref 31.0–37.0)
MEAN CORPUSCULAR HEMOGLOBIN: 28.2 pg (ref 26.0–34.0)
MEAN CORPUSCULAR VOLUME: 82.8 fL (ref 80.0–100.0)
MEAN PLATELET VOLUME: 8.4 fL (ref 7.0–10.0)
MONOCYTES ABSOLUTE COUNT: 0.2 10*9/L (ref 0.2–0.8)
NEUTROPHILS ABSOLUTE COUNT: 1.9 10*9/L — ABNORMAL LOW (ref 2.0–7.5)
PLATELET COUNT: 139 10*9/L — ABNORMAL LOW (ref 150–440)
RED BLOOD CELL COUNT: 3.17 10*12/L — ABNORMAL LOW (ref 4.50–5.90)
RED CELL DISTRIBUTION WIDTH: 16.9 % — ABNORMAL HIGH (ref 12.0–15.0)
WBC ADJUSTED: 2.4 10*9/L — ABNORMAL LOW (ref 4.5–11.0)

## 2017-07-10 LAB — COMPREHENSIVE METABOLIC PANEL
ALBUMIN: 3.3 g/dL — ABNORMAL LOW (ref 3.5–5.0)
ALT (SGPT): 14 U/L — ABNORMAL LOW (ref 19–72)
ANION GAP: 8 mmol/L — ABNORMAL LOW (ref 9–15)
AST (SGOT): 45 U/L (ref 19–55)
BILIRUBIN TOTAL: 0.6 mg/dL (ref 0.0–1.2)
BLOOD UREA NITROGEN: 33 mg/dL — ABNORMAL HIGH (ref 7–21)
BUN / CREAT RATIO: 18
CALCIUM: 11.1 mg/dL — ABNORMAL HIGH (ref 8.5–10.2)
CHLORIDE: 98 mmol/L (ref 98–107)
CREATININE: 1.79 mg/dL — ABNORMAL HIGH (ref 0.70–1.30)
EGFR MDRD AF AMER: 45 mL/min/{1.73_m2} — ABNORMAL LOW (ref >=60)
EGFR MDRD NON AF AMER: 37 mL/min/{1.73_m2} — ABNORMAL LOW (ref >=60)
GLUCOSE RANDOM: 131 mg/dL (ref 65–179)
PROTEIN TOTAL: 5.5 g/dL — ABNORMAL LOW (ref 6.5–8.3)
SODIUM: 131 mmol/L — ABNORMAL LOW (ref 135–145)

## 2017-07-10 LAB — BUN / CREAT RATIO: Urea nitrogen/Creatinine:MRto:Pt:Ser/Plas:Qn:: 18

## 2017-07-10 NOTE — Unmapped (Signed)
Labwork:  Lab on 07/10/2017   Component Date Value Ref Range Status   ??? Sodium 07/10/2017 131* 135 - 145 mmol/L Final   ??? Potassium 07/10/2017 5.0  3.5 - 5.0 mmol/L Final   ??? Chloride 07/10/2017 98  98 - 107 mmol/L Final   ??? CO2 07/10/2017 25.0  22.0 - 30.0 mmol/L Final   ??? BUN 07/10/2017 33* 7 - 21 mg/dL Final   ??? Creatinine 07/10/2017 1.79* 0.70 - 1.30 mg/dL Final   ??? BUN/Creatinine Ratio 07/10/2017 18   Final   ??? EGFR MDRD Non Af Amer 07/10/2017 37* >=60 mL/min/1.59m2 Final   ??? EGFR MDRD Af Amer 07/10/2017 45* >=60 mL/min/1.89m2 Final   ??? Anion Gap 07/10/2017 8* 9 - 15 mmol/L Final   ??? Glucose 07/10/2017 131  65 - 179 mg/dL Final   ??? Calcium 13/01/6577 11.1* 8.5 - 10.2 mg/dL Final   ??? Albumin 46/96/2952 3.3* 3.5 - 5.0 g/dL Final   ??? Total Protein 07/10/2017 5.5* 6.5 - 8.3 g/dL Final   ??? Total Bilirubin 07/10/2017 0.6  0.0 - 1.2 mg/dL Final   ??? AST 84/13/2440 45  19 - 55 U/L Final   ??? ALT 07/10/2017 14* 19 - 72 U/L Final   ??? Alkaline Phosphatase 07/10/2017 83  38 - 126 U/L Final   ??? WBC 07/10/2017 2.4* 4.5 - 11.0 10*9/L Final   ??? RBC 07/10/2017 3.17* 4.50 - 5.90 10*12/L Final   ??? HGB 07/10/2017 8.9* 13.5 - 17.5 g/dL Final   ??? HCT 04/19/2535 26.3* 41.0 - 53.0 % Final   ??? MCV 07/10/2017 82.8  80.0 - 100.0 fL Final   ??? MCH 07/10/2017 28.2  26.0 - 34.0 pg Final   ??? MCHC 07/10/2017 34.0  31.0 - 37.0 g/dL Final   ??? RDW 64/40/3474 16.9* 12.0 - 15.0 % Final   ??? MPV 07/10/2017 8.4  7.0 - 10.0 fL Final   ??? Platelet 07/10/2017 139* 150 - 440 10*9/L Final   ??? Variable HGB Concentration 07/10/2017 Slight* Not Present Final   ??? Absolute Neutrophils 07/10/2017 1.9* 2.0 - 7.5 10*9/L Final   ??? Absolute Lymphocytes 07/10/2017 0.3* 1.5 - 5.0 10*9/L Final   ??? Absolute Monocytes 07/10/2017 0.2  0.2 - 0.8 10*9/L Final   ??? Absolute Eosinophils 07/10/2017 0.0  0.0 - 0.4 10*9/L Final   ??? Absolute Basophils 07/10/2017 0.0  0.0 - 0.1 10*9/L Final   ??? Large Unstained Cells 07/10/2017 2  0 - 4 % Final   ??? Microcytosis 07/10/2017 Slight* Not Present Final   ??? Anisocytosis 07/10/2017 Slight* Not Present Final   ??? Hypochromasia 07/10/2017 Slight* Not Present Final     Please call us if you experience:   1. Nausea or vomiting not controlled by nausea medicines  2. Fever of 100.4 F or higher   3. Uncontrolled pain  4. Any other concerning symptom     For any cancer-related concerns, including:   Appointment information   Questions regarding your cancer diagnosis or treatment   Any new symptoms.     Please call [phone].    On Nights, Weekends and Holidays please and ask for the oncologist on call.    [facility]  [address]  [place], [place] [address]  [email or web address]

## 2017-07-10 NOTE — Unmapped (Signed)
Pt has pre mixed omipaque oral contrast solution. He has had 1000 ( 2 bottles) that he has drank over an hour before coming back for his scan.

## 2017-07-10 NOTE — Unmapped (Signed)
Labs found to be within parameters for treatment today. Request for drug sent to pharmacy.

## 2017-07-10 NOTE — Unmapped (Signed)
Hi,    [name], with [employer] and coordinator for patient, Taylor Ramos, is requesting to speak to the navigator directly regarding prior authorization for CT of the abdomin and chest. The best number to reach [name] is [phone].     Thank you,  Drema Balzarine  Cancer Communication Center  [phone]

## 2017-07-10 NOTE — Unmapped (Signed)
Per dr. Romeo Rabon approved to try this oral contrast even though its not a peds patient.

## 2017-07-10 NOTE — Unmapped (Signed)
AOC Triage Note     Patient: Taylor Ramos     Reason for call:  [name] with [employer]/CT of the abd & chest    Time call returned: 9:23 am     Phone Assessment:  Spoke with [employer] representative stated that patients CT scan of the abd & pelvis with & without contrast has been approved.   The fax# provided to them is not working so they have mailed a copy to the facility.   I requested that the copy of the referral be faxed to [phone].  [employer] has Deniece Portela as the [facility] facility contact, will forward information to  [email or web address]     Triage Recommendations: Will forward information as appropriate.   Patient Response: N/A     Outstanding tasks: N/A

## 2017-07-11 NOTE — Unmapped (Signed)
Pt arrived in wheelchair pushed by Shanda Bumps, CST  1805 BP elevated after first 30 minutes of Rituximab.NP S. Morris notified. OK to continue with rituximab infusion  Orders received for prn hydralazine and metoprolol as at home. 1930 Pt tolerated chemotherapy well  Left infusion area in wheelchair pushed by family. AVS printed and given to patient. Encouraged pt and his wife to continue to monitor blood pressure at home and call MD if pressure continues to stay elevated.

## 2017-07-11 NOTE — Unmapped (Signed)
If you feel like you're having an emergency please call 911.  For appointments or questions Monday through Friday 8AM-5PM please call [phone] or Toll Free [phone]. For Medical questions or concerns ask for the Nurse Triage Line.  On Nights, Weekends, and Holidays call [phone] and ask for the Oncologist on Call.  Reasons to call the Nurse Triage Line:  Fever of 100.5 or greater  Nausea and/or vomiting not relived with nausea medicine  Diarrhea or constipation  Severe pain not relieved with usual pain regimen  Shortness of breath  Uncontrolled bleeding  Mental status changes

## 2017-07-12 NOTE — Unmapped (Signed)
Hem/Onc Phone Triage Note     Caller: wife    Reason for Call: hiccups  Taylor Ramos is a 77 y.o. with PTLD currently on Rituximab therapy. He has had hiccups throughout the month of January and was prescribed baclofen on 1/8. On 1/11, he developed hallucinations and went to a local ED. Per his wife, workup in the ED was negative and he was prescribed Mirtazepine - I assume for the hallucinations, but she was not clear on the indication. He improved last weekend on this medication and she stopped giving it to him on Sunday, 1/13. Hallucinations improved.     2 days ago, his hiccups started up again and were severe last night, such that he had difficulty sleeping. His wife calls today to see what she should do. He is otherwise well - ROS is negative.     Assessment/Plan:   I told them that it is possible that the Baclofen caused the hallucinations and so I would not use the Baclofen, at least at that same dose, to treat hiccups. I also advised them against using the mirtazapine to treat hiccups, as it will not help. I told her that we do not have any medications that are truly effective at treating hiccups and that since he reacted poorly to the Baclofen, I would advise against using medications to treat them. I will copy Dr. Aviva Kluver and his team and if they feel differently, they will call the patient to make recommendations re: medications and potentially prescribe other medications if they feel it is appropriate.      Please page Med E Fellow at [phone] if patient needs admission or questions about care occur.     Fellow Taking Call:  Renelda Mom  July 12, 2017 9:48 AM

## 2017-07-13 NOTE — Unmapped (Signed)
Hem/Onc Phone Triage Note    Caller: Wife    Reason for Call:   Taylor Ramos is a 77 y.o. M with PTLD. His wife calls to report that he has been more sleepy recently and has slept most of the day the past 1-2 days. She has been giving him baclofen 5mg  TID for his hiccups, otherwise she has stopped giving him percocet/oxycodone and denies other new meds. She does think the baclofen is helping with the hiccups and he has not had any hallucinations. He otherwise feels ok, denies fever, SOB, or headache.     Assessment/Plan:   1. I advised Mrs. Torrence to try giving him the baclofen twice today instead of TID, with the second dose at bedtime, to see if this helps him be less sleepy. She will also continue to monitor him for new symptoms, including fever, confusion, or headache. She is in agreement with this plan.     Fellow Taking Call:  Arthur Holms  July 13, 2017 12:15 PM

## 2017-07-14 MED ORDER — FUROSEMIDE 20 MG TABLET
ORAL_TABLET | 11 refills | 0 days
Start: 2017-07-14 — End: 2017-07-24

## 2017-07-15 NOTE — Unmapped (Signed)
[facility] Refill and Clinical Coordination Note  Medication(s): Taylor Ramos, DOB: 1941/02/26  Phone: [phone] (home) , Alternate phone contact: N/A  Shipping address: [address]  [place] [address]  Phone or address changes today?: No  All above HIPAA information verified.  Insurance changes? No    Completed refill and clinical call assessment today to schedule patient's medication shipment from the [facility] [phone]).      MEDICATION RECONCILIATION    Confirmed the medication and dosage are correct and have not changed: Yes, regimen is correct and unchanged.    Were there any changes to your medication(s) in the past month:  No, there are no changes reported at this time.    ADHERENCE    Is this medicine transplant or covered by Medicare Part B? Yes.    Prograf 1 mg   Quantity filled last month: 180   # of tablets left on hand: 7 DAYS LEFT        Did you miss any doses in the past 4 weeks? No missed doses reported.  Adherence counseling provided? Not needed     SIDE EFFECT MANAGEMENT    Are you tolerating your medication?:  [name] reports tolerating the medication.  Side effect management discussed: None      Therapy is appropriate and should be continued.    Evidence of clinical benefit: See Epic note from 03/04/17      FINANCIAL/SHIPPING    Delivery Scheduled: Yes, Expected medication delivery date: 07/17/17   Additional medications refilled: No additional medications/refills needed at this time.    [name] did not have any additional questions at this time.    Delivery address validated in [facility] scheduling system: Yes, address listed above is correct.      We will follow up with patient monthly for standard refill processing and delivery.      Thank you,  Thad Ranger   [facility] Specialty Pharmacist

## 2017-07-16 MED ORDER — TACROLIMUS 1 MG CAPSULE
ORAL | 11 refills | 0.00000 days | Status: CP
Start: 2017-07-16 — End: 2018-07-16

## 2017-07-16 MED ORDER — TACROLIMUS 1 MG CAPSULE: each | 11 refills | 0 days

## 2017-07-17 MED FILL — TACROLIMUS/1MG/CAPS: TACROLIMUS/1MG/CAPS | 30 days supply | Qty: 180 | Fill #0

## 2017-07-17 NOTE — Unmapped (Signed)
[facility] Triage Note     Patient: Taylor Ramos     Reason for call:  follow up call regarding patients nausea and vomitting from 1/24    Time call returned: 1028     Phone Assessment: No answer, no voice mail     Triage Recommendations: Spoke with wife yesterday to pick up patient anti-emetics from pharmacy.  Was calling back to check on how patient was doing but unable to reach.     Patient Response: unknown     Outstanding tasks: Call from patients wife if further help is needed     Patient Pharmacy has been verified and primary pharmacy has been marked as preferred

## 2017-07-17 NOTE — Unmapped (Signed)
[facility] Specialty Medication Referral: No PA required    Medication (Brand/Generic): Tacrolimus 1mg     Initial [employer] Test Claim completed with resulted information below:  No PA required  Patient ABLE to fill at [facility]  Insurance Company:  Monia Pouch  Anticipated Copay: $23.96    As Co-pay is under $100 defined limit, per policy there will be no further investigation of need for financial assistance at this time unless patient requests. This referral has been communicated to the provider and handed off to the [facility] team for further processing and filling of prescribed medication.   ______________________________________________________________________  Please utilize this referral for viewing purposes as it will serve as the central location for all relevant documentation and updates.

## 2017-07-17 NOTE — Unmapped (Signed)
IDENTIFICATION: This is a 77 y.o. male who presents for an return visit.     Diagnosis: PTLD  Stage: IV  CNS Risk: low  GELF: N/A  Regimen: R2 (07/10/17-); R2 x42mo (03/2017-04/2017); s/p O-GemOx x2 (02/04/17-02/24/17); s/p R-miniCHOP x6 (10/2016)  The patient was referred to the [facility] by Dr. Merlene Pulling from [facility].    STAGING/WORKUP:  The patient initially presented to [facility] for 2nd opinion regarding his advanced, relapsed PTLD with involvement in the mesentery, L IJ LN, pulmonary nodules, peritoneum on recent PET (7/25). Compared to prior PET on 10/29/16, the disease had shown significant progression. The patient's CBC showed pancytopenia, and his CMP showed elevated creatinine, calcium, and normal LFTs. His LDH was elevated to 1262. HIV and HBV negative.     PROGNOSIS:  Relapsed PTLD is a/w a poor prognosis. Particularly in a patient with advanced age and poor PS, who is not a transplant candidate.    TREATMENT:  The patient was treated with frontline R-miniCHOP that completed in 10/2016. He had a fast relapse of <3 mo. Unfortunately, options were limited for this patient. He was not a candidate for aggressive salvage therapy and consolidation with autoSCT. I recommended O-GemOx at the time and he tolerated 2 cycles but progressed. After a discussion with Dr. Merlene Pulling, I then recommended R2, which was started, but he has been delayed d/t cytopenias. The patient returns to see me for another opinion today, and I recommend repeating a PET-CT since he has been off therapy for so long, and then restarting low-dose R2 (rev 2.5mg ). Unfortunately, we had difficulty getting a PET-CT covered by his insurance, so we ordered a CT CAP. This scan showed significant progression, including new hepatic lesions. At this point, the patient requested to follow here at [facility], which I confirmed is okay with Dr. Merlene Pulling. Pt will RTC in 1 month for C2 R2.    Of note, he would not be a candidate for the EBV-CTL study because he is not positive for EBV. CD30 is negative, so BV is not an option.     OTHER ISSUES:  Worsening OA in R Knee: will refer to ortho here at [facility] for an opinion.     Chronic Back Pain: Pain clinic referral.     Inguinal Hernia: reducible, but very large and becoming more painful. Gen surg referral.    A total of 40 minutes were spent face-to-face with the patient during this encounter and over half of that time was spent on counseling and coordination of care.     Winona Legato, DO, MPH  Assistant Professor of Medicine  Division of Hematology and Oncology    ---------------------------------------------------    INTERVAL HISTORY:  Pt returns with recurrent PTLD after several lines of therapy. He has back and knee pain as well as a reducible hernia. Denies B-symptoms, CP, SOB.    PAST MEDICAL HISTORY:  Past Medical History:   Diagnosis Date   ??? Arthritis    ??? Chronic kidney disease    ??? Coronary artery disease    ??? Gout    ??? History of transfusion    ??? Hypertension    ??? Kidney transplant status, cadaveric 2006   ??? Peptic ulceration        MEDICATIONS:  Current Outpatient Prescriptions   Medication Sig Dispense Refill   ??? acetaminophen (TYLENOL) 325 MG tablet Take by mouth every six (6) hours as needed for pain.     ??? allopurinol (ZYLOPRIM) 100 MG tablet Take  2 tablets (200 mg total) by mouth daily. 180 tablet 3   ??? finasteride (PROSCAR) 5 mg tablet TAKE 1 TABLET (5 MG TOTAL) BY MOUTH DAILY. 90 tablet 3   ??? hydroxypropyl methylcellulose (ISOPTO TEARS) 2.5 % ophthalmic solution 1 drop as needed.     ??? lenalidomide (REVLIMID) 2.5 mg cap capsule Take 1 capsule (2.5 mg total) by mouth daily. 21 capsule 0   ??? megestrol (MEGACE) 400 mg/10 mL (40 mg/mL) suspension Take 200 mg by mouth.     ??? metoprolol tartrate (LOPRESSOR) 25 MG tablet Take 0.5 tablets (12.5 mg total) by mouth Two (2) times a day. 30 tablet 11   ??? multivitamin (MULTIVITAMIN) per tablet Take 1 tablet by mouth daily.      ??? oxybutynin (DITROPAN-XL) 5 MG 24 hr tablet Take 5 mg by mouth daily. TAKE 1 TABLET (5 MG TOTAL) BY MOUTH DAILY.  3   ??? pantoprazole (PROTONIX) 40 MG tablet TAKE 1 TABLET BY MOUTH TWICE A DAY 60 tablet 1   ??? predniSONE (DELTASONE) 5 MG tablet TAKE 1 TABLET (5 MG TOTAL) BY MOUTH DAILY. 90 tablet 3   ??? simethicone (GAS RELIEF) 80 MG chewable tablet Chew 80 mg.     ??? sucralfate (CARAFATE) 1 gram tablet Take 1 g by mouth Four (4) times a day.     ??? tamsulosin (FLOMAX) 0.4 mg capsule TAKE 1 CAPSULE (0.4 MG TOTAL) BY MOUTH TWO (2) TIMES A DAY. 180 capsule 3   ??? albuterol (PROAIR HFA) 90 mcg/actuation inhaler Inhale 2 puffs every four (4) hours as needed. (Patient not taking: Reported on 07/10/2017) 1 Inhaler 11   ??? allopurinol (ZYLOPRIM) 100 MG tablet TAKE 1 TABLET (100 MG TOTAL) BY MOUTH DAILY. 90 tablet 3   ??? aspirin 81 MG chewable tablet Chew 1 tablet (81 mg total) daily. (Patient not taking: Reported on 07/10/2017) 30 tablet prn   ??? carboxymethylcellulose sodium (THERATEARS) 0.25 % Drop Administer 2 drops to both eyes 4 (four) times a day as needed (Dry eyes). (Patient not taking: Reported on 07/10/2017) 30 mL 2   ??? colchicine 0.6 mg cap capsule Take 0.6 mg by mouth daily.     ??? diphenhydrAMINE (BENADRYL) 25 mg capsule Take 25 mg by mouth.     ??? furosemide (LASIX) 20 MG tablet 1-2 tablets daily as needed 60 tablet 11   ??? hydroCHLOROthiazide (HYDRODIURIL) 25 MG tablet Take 25 mg by mouth.     ??? lactulose (CHRONULAC) 10 gram/15 mL solution Take 10 mL by mouth.     ??? miscellaneous medical supply (C-TUB) Misc Take 1 Bottle by mouth.     ??? ondansetron (ZOFRAN) 4 MG tablet Take 4 mg by mouth.     ??? ondansetron (ZOFRAN) 8 MG tablet Take 1 tablet (8 mg total) by mouth every eight (8) hours as needed for nausea (or vomiting). (Patient not taking: Reported on 07/10/2017) 30 tablet 2   ??? oxyCODONE-acetaminophen (PERCOCET) 5-325 mg per tablet Take 5-325 tablets by mouth.     ??? predniSONE (DELTASONE) 5 MG tablet TAKE 1 TABLET (5 MG TOTAL) BY MOUTH DAILY. 90 tablet 3 ??? prochlorperazine (COMPAZINE) 10 MG tablet Take 1 tablet (10 mg total) by mouth every six (6) hours as needed (nausea or vomitting). (Patient not taking: Reported on 07/10/2017) 30 tablet 2   ??? tacrolimus (PROGRAF) 1 MG capsule Take 3 capsules (3mg ) in the morning and 3 capsules (3mg ) at night. Z94.0, please keep on same generic if possible 180 capsule 11  No current facility-administered medications for this visit.        ALLERGIES:  Allergies   Allergen Reactions   ??? Oxycodone-Acetaminophen Other (See Comments)     Reaction: paranoia, hallucinations       SOCIAL HISTORY:  Social History     Social History   ??? Marital status: Single     Spouse name: N/A   ??? Number of children: N/A   ??? Years of education: N/A     Social History Main Topics   ??? Smoking status: Former Smoker     Quit date: 03/17/1980   ??? Smokeless tobacco: Never Used   ??? Alcohol use No   ??? Drug use: No   ??? Sexual activity: Not on file     Other Topics Concern   ??? Not on file     Social History Narrative   ??? No narrative on file       FAMILY HISTORY:  Family History   Problem Relation Age of Onset   ??? Cancer Mother    ??? Anesthesia problems Neg Hx        REVIEW OF SYSTEMS:  See HPI. A 10 system ROS is otherwise negative.    VITAL SIGNS:   Vitals:    07/10/17 1237   BP: 130/63   Pulse: 64   Resp: 16   Temp: 36.4 ??C (97.5 ??F)   TempSrc: Oral   SpO2: 98%   Weight: 53.1 kg (117 lb)   Height: 167.6 cm (5' 5.98)     EXAM:  ECOG: 2  CONST: Frail, NAD, Awake, Alert  HEENT: Oropharynx clear.   LYMPH: no palpable axillary, inguinal, cervical LAD  RESP: Clear to auscultation bilaterally.  CV: Irregular rhythm. No rubs, gallops or murmurs.   GI: Soft, nontender, nondistended. No hepatosplenomegaly.   MSK: trace edema  NEURO: No focal deficits    LABORATORY:  Lab on 07/10/2017   Component Date Value Ref Range Status   ??? Sodium 07/10/2017 131* 135 - 145 mmol/L Final   ??? Potassium 07/10/2017 5.0  3.5 - 5.0 mmol/L Final   ??? Chloride 07/10/2017 98  98 - 107 mmol/L Final   ??? CO2 07/10/2017 25.0  22.0 - 30.0 mmol/L Final   ??? BUN 07/10/2017 33* 7 - 21 mg/dL Final   ??? Creatinine 07/10/2017 1.79* 0.70 - 1.30 mg/dL Final   ??? BUN/Creatinine Ratio 07/10/2017 18   Final   ??? EGFR MDRD Non Af Amer 07/10/2017 37* >=60 mL/min/1.59m2 Final   ??? EGFR MDRD Af Amer 07/10/2017 45* >=60 mL/min/1.17m2 Final   ??? Anion Gap 07/10/2017 8* 9 - 15 mmol/L Final   ??? Glucose 07/10/2017 131  65 - 179 mg/dL Final   ??? Calcium 16/03/9603 11.1* 8.5 - 10.2 mg/dL Final   ??? Albumin 54/02/8118 3.3* 3.5 - 5.0 g/dL Final   ??? Total Protein 07/10/2017 5.5* 6.5 - 8.3 g/dL Final   ??? Total Bilirubin 07/10/2017 0.6  0.0 - 1.2 mg/dL Final   ??? AST 14/78/2956 45  19 - 55 U/L Final   ??? ALT 07/10/2017 14* 19 - 72 U/L Final   ??? Alkaline Phosphatase 07/10/2017 83  38 - 126 U/L Final   ??? WBC 07/10/2017 2.4* 4.5 - 11.0 10*9/L Final   ??? RBC 07/10/2017 3.17* 4.50 - 5.90 10*12/L Final   ??? HGB 07/10/2017 8.9* 13.5 - 17.5 g/dL Final   ??? HCT 21/30/8657 26.3* 41.0 - 53.0 % Final   ??? MCV 07/10/2017 82.8  80.0 -  100.0 fL Final   ??? MCH 07/10/2017 28.2  26.0 - 34.0 pg Final   ??? MCHC 07/10/2017 34.0  31.0 - 37.0 g/dL Final   ??? RDW 16/03/9603 16.9* 12.0 - 15.0 % Final   ??? MPV 07/10/2017 8.4  7.0 - 10.0 fL Final   ??? Platelet 07/10/2017 139* 150 - 440 10*9/L Final   ??? Variable HGB Concentration 07/10/2017 Slight* Not Present Final   ??? Absolute Neutrophils 07/10/2017 1.9* 2.0 - 7.5 10*9/L Final   ??? Absolute Lymphocytes 07/10/2017 0.3* 1.5 - 5.0 10*9/L Final   ??? Absolute Monocytes 07/10/2017 0.2  0.2 - 0.8 10*9/L Final   ??? Absolute Eosinophils 07/10/2017 0.0  0.0 - 0.4 10*9/L Final   ??? Absolute Basophils 07/10/2017 0.0  0.0 - 0.1 10*9/L Final   ??? Large Unstained Cells 07/10/2017 2  0 - 4 % Final   ??? Microcytosis 07/10/2017 Slight* Not Present Final   ??? Anisocytosis 07/10/2017 Slight* Not Present Final   ??? Hypochromasia 07/10/2017 Slight* Not Present Final     IMAGING:  CT-CAP (07/10/17):  --Left lung pulmonary nodules measuring up to 2.7 cm in the left lower lobe; concerning for metastatic disease.  --Stable sclerosis of the C6 and T2 vertebral bodies, indeterminate. Recommend attention on follow-up.  --Small pericardial effusion.  --Marked interval increase in multiple mesenteric and retroperitoneal nodal masses. The largest of these measures up to 9.5 cm centered in the left psoas muscle, with mass effect upon the aorta and adjacent structures. Measurements somewhat limited due to lack of IV contrast.  --New hepatic masses and marked interval enlargement of splenic masses.  --Indeterminate 1.5 cm exophytic lesion in the right mid kidney.  --Indeterminate lucent/soft tissue lesion in the left superior acetabulum with cortical breakthrough, similar to prior PET-CT, not FDG avid at that time; this could predispose to pathologic fracture.    PET-CT (01/14/17):  1. Marked progression of disease as evidenced by progressive bulky mesenteric adenopathy, new low left internal jugular/left juxtadiaphragmatic/abdominal retroperitoneal adenopathy, new pulmonary  nodules, new hepatic and splenic lesions and new peritoneal nodules, all of which are hypermetabolic.    PET-CT (10/29/16):  Hypermetabolic splenic foci and mesenteric hypermetabolic masses, worrisome for lymphoma involvement. Lymphoma involvement in subtle retroperitoneal lymph nodes is also suspected.  0.8 cm hypermetabolic nodule in the right kidney, worrisome for a primary renal malignancy process. Further evaluation is recommended.    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

## 2017-07-17 NOTE — Unmapped (Signed)
AOC Triage Note     Patient: Taylor Ramos     Reason for call:  Emesis 5 times today of phlegm    Time call returned: 1705     Phone Assessment: Spoke with patients wife and out of the blue he started vomitting today.  He ate breakfast and that later on he started to vomit phlegm.  She denies fever, chills, diarrhea.  He has had 3 normal bowel movements, has been also belching.  She stated his stomach is not distended and no pain.  Occassional hiccups.  He has not taken anything for nause     Triage Recommendations: Informed wife that both zofran and compazine was called to his preferred pharmacy and to follow as directed.  Also encouraged her to give him some Simethicon for gas. Encouraged Brat diet and frequent sips of fluids to stay hydrated and will call her in AM to check on how he is doing.      Patient Response: grateful     Outstanding tasks: Follow up with patients wife on how he is doing on 1/25.     Patient Pharmacy has been verified and primary pharmacy has been marked as preferred

## 2017-07-17 NOTE — Unmapped (Signed)
Arletha Pili called to report that patient Taylor Ramos has been throwing up since breakfast this morning. Emesis somewhat sporadic.    Please contact Ms. Moore at [phone].

## 2017-07-17 NOTE — Unmapped (Addendum)
[facility] Refill and Clinical Coordination Note  Medication(s): tacrolimus    Taylor Ramos, DOB: 11-14-1940  Phone: [phone] (home) , Alternate phone contact: N/A  Shipping address: [address]  [place] [address]  Phone or address changes today?: No  All above HIPAA information verified.  Insurance changes? No    Completed refill and clinical call assessment today to schedule patient's medication shipment from the [facility] [phone]).      MEDICATION RECONCILIATION    Confirmed the medication and dosage are correct and have not changed: Yes, regimen is correct and unchanged.However we are switching from brand to generic. Also paid $20 on acct today. Pt aware of $23.96 copay and ok with it for generic.    Were there any changes to your medication(s) in the past month:  No, there are no changes reported at this time.    ADHERENCE    Is this medicine transplant or covered by Medicare Part B? No.    Prograf 1 mg   Quantity filled last month: 180   # of tablets left on hand: 7 days left    Did you miss any doses in the past 4 weeks? No missed doses reported.  Adherence counseling provided? Not needed     SIDE EFFECT MANAGEMENT    Are you tolerating your medication?:  Taylor Ramos reports tolerating the medication.  Side effect management discussed: None      Therapy is appropriate and should be continued.    Evidence of clinical benefit: See Epic note from 03/04/17      FINANCIAL/SHIPPING    Delivery Scheduled: Yes, Expected medication delivery date: 07/20/17   Additional medications refilled: No additional medications/refills needed at this time.    Taylor Ramos did not have any additional questions at this time.    Delivery address validated in [facility] scheduling system: Yes, address listed above is correct.      We will follow up with patient monthly for standard refill processing and delivery.      Thank you,  Thad Ranger   [facility] Specialty Pharmacist

## 2017-07-18 NOTE — Unmapped (Signed)
Hem/Onc Phone Triage Note    Caller: Wife    Reason for Call:   Taylor Ramos is a 77 y.o. M with PTLD currently on R2. His wife is calling today to report that he has had diarrhea the past 2 days and today hasn't been eating or drinking. He also has nausea/vomiting, but this has improved with PRN anti-emetics. He is also having gum soreness.       Assessment/Plan:   1. Suspect diarrhea, nausea, vomiting related to recent start of revlimid. I spoke with both the patient and his wife and presented 2 options: 1) Hold the revlimid for now, drink fluids as much as possible, and see if the diarrhea improves, or 2) Present to the ED for evaluation of dehydration. Taylor Ramos states that he will start drinking fluids and hold his revlimid for now. His wife will keep an eye on him and if he continues to have diarrhea, is unable to drink fluids, or starts having dark urine or making less urine, she will bring him to the ED. Taylor Ramos and his wife are both in agreement with this.       Fellow Taking Call:  Arthur Holms  July 18, 2017 3:34 PM

## 2017-07-21 NOTE — Unmapped (Signed)
[facility] Triage Note     Patient: Taylor Ramos     Reason for call:  patient condition    Time call returned: 1111     Phone Assessment:   Returned call to pt. Spoke with wife. Pt c/o the following:   Increased pain. Unable to pinpoint location. States that pain is all over. Started last night. Took Tylenol 500mg  with some relief. Wife states pt is unable to take anything else due to hallucinations.    Pt c/o constipation. Having diarrhea. Per recommendation of Dr. Minna Antis, pt stopped Revlimid on Saturday. Pt had very small BM last night but denies any other BM since Saturday. States that pt abd is slightly firm. Pt has not taking anything for constipation. Pt states that Milk of Mag typically works for his constipation.     Pt states he is not eating. Reports that yesterday he had 1/4 of a pork biscuit and piece of cake.  Pt reports decreased fluid intake. Reports drinking 2 cups of Gatorade yesterday,  8 oz of water, and water when taking pills. Pt reports that urine is light yellow.     Pt reports sore gums x 5 days.. Sates that gums appear red and a white puffed spot on the jaw. Denies bleeding. Using Listerine. Temp 98.36F.     Pt c/o hiccups on and off throughout day. States that he took baclofen in the past with little relief but that medication caused hallucinations. Pt reports that he has hiccups in 10 min episodes and that it causes him to get short of breath but quickly recovers once hiccups end. Wife states that pt gets relief if she pats him on the back.        Triage Recommendations:   Suggested increasing Tylenol dose. Stated pt could take Tylenol 500mg  1-2 tabs every  6 hours with a Max dose of 3000 mg daily (6 pills).     Encouraged pt to take 2 Tablespoons milk of mag every 6 hours until bowels move. Encouraged pt to call if no BM in 24 hours after starting milk of mag.     Recommended that pt try to eat smaller, blander meals. Suggested eating things such a rice, apple sauce, toast and eating smaller quantities throughout the day. Encouraged pt to continue to take fluids. Suggested keeping bottled water near him and sipping throughout the day.     Encouraged pt to monitor temp and to call if temp greater than 100.9F. Encouraged pt to avoid mouthwash containing alcohol and to rinse with water, baking soda, and salt as directed. Told pt to avoid hot and spicy foods.    Told pt I would let team know about hiccups and follow up with any suggestions.      Told pt I would notify team of his condition and follow up with any other recommendations.          Patient Response: verbalized understanding and agreement with plan     Outstanding tasks: follow up with pt     Patient Pharmacy has been verified and primary pharmacy has been marked as preferred

## 2017-07-21 NOTE — Unmapped (Signed)
Hi,    The patient has contacted the [facility] with report of intolerable pain.  We have sent a page to the Oncology Triage Pager per our urgent page guidelines.    Thank you,  Drema Balzarine  [facility] Communication Center  [phone]

## 2017-07-22 NOTE — Unmapped (Signed)
This encounter was created in error - please disregard.

## 2017-07-24 ENCOUNTER — Ambulatory Visit: Admit: 2017-07-24 | Discharge: 2017-07-27 | Payer: MEDICARE

## 2017-07-24 ENCOUNTER — Other Ambulatory Visit: Admit: 2017-07-24 | Discharge: 2017-07-27 | Payer: MEDICARE

## 2017-07-24 DIAGNOSIS — Z940 Kidney transplant status: Secondary | ICD-10-CM

## 2017-07-24 DIAGNOSIS — K521 Toxic gastroenteritis and colitis: Secondary | ICD-10-CM

## 2017-07-24 DIAGNOSIS — C8336 Diffuse large B-cell lymphoma, intrapelvic lymph nodes: Principal | ICD-10-CM

## 2017-07-24 DIAGNOSIS — R066 Hiccough: Secondary | ICD-10-CM

## 2017-07-24 DIAGNOSIS — D702 Other drug-induced agranulocytosis: Secondary | ICD-10-CM

## 2017-07-24 DIAGNOSIS — R52 Pain, unspecified: Secondary | ICD-10-CM

## 2017-07-24 LAB — CBC W/ AUTO DIFF
BASOPHILS ABSOLUTE COUNT: 0 10*9/L (ref 0.0–0.1)
EOSINOPHILS ABSOLUTE COUNT: 0 10*9/L (ref 0.0–0.4)
HEMATOCRIT: 27.1 % — ABNORMAL LOW (ref 41.0–53.0)
HEMOGLOBIN: 9 g/dL — ABNORMAL LOW (ref 13.5–17.5)
LARGE UNSTAINED CELLS: 3 % (ref 0–4)
LYMPHOCYTES ABSOLUTE COUNT: 0.2 10*9/L — ABNORMAL LOW (ref 1.5–5.0)
MEAN CORPUSCULAR HEMOGLOBIN CONC: 33.4 g/dL (ref 31.0–37.0)
MEAN CORPUSCULAR HEMOGLOBIN: 27.7 pg (ref 26.0–34.0)
MEAN CORPUSCULAR VOLUME: 83.1 fL (ref 80.0–100.0)
MEAN PLATELET VOLUME: 7.1 fL (ref 7.0–10.0)
PLATELET COUNT: 91 10*9/L — ABNORMAL LOW (ref 150–440)
RED BLOOD CELL COUNT: 3.26 10*12/L — ABNORMAL LOW (ref 4.50–5.90)
RED CELL DISTRIBUTION WIDTH: 18.3 % — ABNORMAL HIGH (ref 12.0–15.0)
WBC ADJUSTED: 1 10*9/L — ABNORMAL LOW (ref 4.5–11.0)

## 2017-07-24 LAB — HEPATIC FUNCTION PANEL
ALBUMIN: 3.5 g/dL (ref 3.5–5.0)
ALKALINE PHOSPHATASE: 69 U/L (ref 38–126)
ALT (SGPT): 17 U/L — ABNORMAL LOW (ref 19–72)
AST (SGOT): 52 U/L (ref 19–55)
BILIRUBIN TOTAL: 0.8 mg/dL (ref 0.0–1.2)
PROTEIN TOTAL: 5.4 g/dL — ABNORMAL LOW (ref 6.5–8.3)

## 2017-07-24 LAB — LIPID PANEL
CHOLESTEROL/HDL RATIO SCREEN: 4.1 (ref <5.0)
HDL CHOLESTEROL: 40 mg/dL (ref 40–59)
LDL CHOLESTEROL CALCULATED: 100 mg/dL — ABNORMAL HIGH (ref 60–99)
NON-HDL CHOLESTEROL: 124 mg/dL
TRIGLYCERIDES: 121 mg/dL (ref 1–149)
VLDL CHOLESTEROL CAL: 24.2 mg/dL (ref 12–42)

## 2017-07-24 LAB — BASIC METABOLIC PANEL
ANION GAP: 9 mmol/L (ref 9–15)
BLOOD UREA NITROGEN: 40 mg/dL — ABNORMAL HIGH (ref 7–21)
BUN / CREAT RATIO: 20
CALCIUM: 10.5 mg/dL — ABNORMAL HIGH (ref 8.5–10.2)
CHLORIDE: 101 mmol/L (ref 98–107)
CO2: 21 mmol/L — ABNORMAL LOW (ref 22.0–30.0)
CREATININE: 2.01 mg/dL — ABNORMAL HIGH (ref 0.70–1.30)
EGFR MDRD AF AMER: 39 mL/min/{1.73_m2} — ABNORMAL LOW (ref >=60)
EGFR MDRD NON AF AMER: 32 mL/min/{1.73_m2} — ABNORMAL LOW (ref >=60)
GLUCOSE RANDOM: 85 mg/dL (ref 65–179)
POTASSIUM: 5.5 mmol/L — ABNORMAL HIGH (ref 3.5–5.0)
SODIUM: 131 mmol/L — ABNORMAL LOW (ref 135–145)

## 2017-07-24 LAB — BASOPHILS ABSOLUTE COUNT: Lab: 0

## 2017-07-24 LAB — HDL CHOLESTEROL: Cholesterol.in HDL:MCnc:Pt:Ser/Plas:Qn:: 40

## 2017-07-24 LAB — CALCIUM: Calcium:MCnc:Pt:Ser/Plas:Qn:: 10.5 — ABNORMAL HIGH

## 2017-07-24 LAB — SLIDE REVIEW

## 2017-07-24 LAB — PHOSPHORUS: Phosphate:MCnc:Pt:Ser/Plas:Qn:: 2.9

## 2017-07-24 LAB — TACROLIMUS, TROUGH: Lab: 17.7

## 2017-07-24 LAB — MAGNESIUM: Magnesium:MCnc:Pt:Ser/Plas:Qn:: 1.9

## 2017-07-24 LAB — OVALOCYTES

## 2017-07-24 LAB — ALT (SGPT): Alanine aminotransferase:CCnc:Pt:Ser/Plas:Qn:: 17 — ABNORMAL LOW

## 2017-07-24 NOTE — Unmapped (Signed)
Discussed with Dr. Arnold Long that pt to be admitted for pain control, failure to thrive, and palliative care consult. Handoff given to Oliver Hum NP and Blain Pais RN. Pt escorted to third-floor infusion waiting room.

## 2017-07-24 NOTE — Unmapped (Signed)
IDENTIFICATION: This is a 77 y.o. male who presents for an return visit.     Diagnosis: PTLD  Stage: IV  CNS Risk: low  GELF: N/A  Regimen: R2 (07/10/17-); R2 x53mo (03/2017-04/2017); s/p O-GemOx x2 (02/04/17-02/24/17); s/p R-miniCHOP x6 (10/2016)  The patient was referred to the [facility] Lymphoma Program by Dr. Merlene Pulling from [facility].    STAGING/WORKUP:  The patient initially presented to [facility] for 2nd opinion regarding his advanced, relapsed PTLD with involvement in the mesentery, L IJ LN, pulmonary nodules, peritoneum on recent PET (7/25). Compared to prior PET on 10/29/16, the disease had shown significant progression. The patient's CBC showed pancytopenia, and his CMP showed elevated creatinine, calcium, and normal LFTs. His LDH was elevated to 1262. HIV and HBV negative.    PROGNOSIS:  Relapsed PTLD is a/w a poor prognosis. Particularly in a patient with advanced age and poor PS, who is not a transplant candidate.    TREATMENT:  The patient was treated with frontline R-miniCHOP that completed in 10/2016. He had a fast relapse of <3 mo. Unfortunately, options were limited for this patient. He was not a candidate for aggressive salvage therapy and consolidation with autoSCT. I recommended O-GemOx at the time and he tolerated 2 cycles but progressed. After a discussion with Dr. Merlene Pulling, I then recommended R2, which was started, but he has been delayed d/t cytopenias. The patient returns to see me for another opinion today, and I recommend repeating a PET-CT since he has been off therapy for so long, and then restarting low-dose R2 (rev 2.5mg ). Unfortunately, we had difficulty getting a PET-CT covered by his insurance, so we ordered a CT CAP. This scan showed significant progression, including new hepatic lesions. At this point, the patient requested to follow here at [facility], which I confirmed is okay with Dr. Merlene Pulling.     Of note, he would not be a candidate for the EBV-CTL study because he is not positive for EBV. CD30 is negative, so BV is not an option.     Future treatment/goals of care:  We discussed at length that his disease is progressing and, as we have gotten to the point where he is also not tolerating treatment and other treatments are not likely to be much more effective or well tolerated, that hospice is very much appropriate. He relayed that he is averse to the idea of hospice because of other people he has seen who have gone to hospice but he is not necessarily famililar with all forms of hospice possible and he would benefit from further goals of care discussion with inaptient team and pallatiive care team when symptoms are better controlled. Hospice would absolutely be appropriate now and it was our recommendation. We do not feel that there are any good cancer-directed therapies left.   --recommend prednisone for anti-lymphoma palliation    Diarrhea: --after less then one week on 2.5 mg daily lenalidomide, developed profuse diarrhea which resolved with holding lenalidomide    Generalized weakness/failure to thrive: likely 2/2 lymphoma progression  --admit for IVFs  --steroids as above  --goals of care discussion as above  --may benefit from from topical mucositis treatment    Whole body pain: etiology? But likely related to lymphoma progression; symptoms difficult to control given hallucinatino with opioids; would schedule acetaminophen and prn opioids for now  --admit for acute symptom control  --consult palliative care for assistance    Hiccups: ?diaphragmatic irritation related to lymphoma given hepatic and splenic involvement  --again, palliative care  assistance will be appreciated    Pancytopenia:  likely 2/2 lenalidomide; do not plan to reinitiate lenalidomide; possible lymphoma marrow involvement  --I think viral infection from immunosuppression (eg CMV) less likely    AKI on CKD in transplanted kidney: acute component, ?lymphoma progression with hydronephrosis or intravascular volume depletion  --still on tacrolimus; would discuss with transplant renal team as it is likely worth trialing him off of immunosupression as this may help his cancer control somewhat but may not be appropriate until really decides on hospice    Seen and discussed with Dr. Aviva Kluver, attestation to follow.    Lunette Stands, MD  Hematology/Oncology Fellow  PGY-6  07/24/2017 12:04 PM      ---------------------------------------------------    INTERVAL HISTORY:  Did well for first 4-5 days after starting R^2; however, began to develop profuse watery diarrhea, about 5-6 times per day until day 8 (1/26) when called in and was asked to stop his lenalidomide. After stopping, diarrhea has stopped. However, shortly after that, he also developed pain in his mouth and gums, including mild bleeding. He has had bad hiccups at the same time and poor appetite. He feels very tired and generally wants to concentrate on his sytmpoms and quality of life. If there were good anti-cancer treatment avaialable for him he would still consider them but mostly wants to concentrate on his quality of life.    PAST MEDICAL HISTORY:  Past Medical History:   Diagnosis Date   ??? Arthritis    ??? Chronic kidney disease    ??? Coronary artery disease    ??? Gout    ??? History of transfusion    ??? Hypertension    ??? Kidney transplant status, cadaveric 2006   ??? Peptic ulceration        MEDICATIONS:  Current Outpatient Prescriptions   Medication Sig Dispense Refill   ??? acetaminophen (TYLENOL) 325 MG tablet Take by mouth every six (6) hours as needed for pain.     ??? albuterol (PROAIR HFA) 90 mcg/actuation inhaler Inhale 2 puffs every four (4) hours as needed. 1 Inhaler 11   ??? allopurinol (ZYLOPRIM) 100 MG tablet Take 2 tablets (200 mg total) by mouth daily. 180 tablet 3   ??? aspirin 81 MG chewable tablet Chew 1 tablet (81 mg total) daily. 30 tablet prn   ??? carboxymethylcellulose sodium (THERATEARS) 0.25 % Drop Administer 2 drops to both eyes 4 (four) times a day as needed (Dry eyes). 30 mL 2   ??? colchicine 0.6 mg cap capsule Take 0.6 mg by mouth daily.     ??? megestrol (MEGACE) 400 mg/10 mL (40 mg/mL) suspension Take 200 mg by mouth.     ??? metoprolol tartrate (LOPRESSOR) 25 MG tablet Take 0.5 tablets (12.5 mg total) by mouth Two (2) times a day. 30 tablet 11   ??? multivitamin (MULTIVITAMIN) per tablet Take 1 tablet by mouth daily.      ??? ondansetron (ZOFRAN) 4 MG tablet Take 4 mg by mouth.     ??? oxybutynin (DITROPAN-XL) 5 MG 24 hr tablet Take 5 mg by mouth daily. TAKE 1 TABLET (5 MG TOTAL) BY MOUTH DAILY.  3   ??? pantoprazole (PROTONIX) 40 MG tablet TAKE 1 TABLET BY MOUTH TWICE A DAY 60 tablet 1   ??? predniSONE (DELTASONE) 5 MG tablet TAKE 1 TABLET (5 MG TOTAL) BY MOUTH DAILY. 90 tablet 3   ??? prochlorperazine (COMPAZINE) 10 MG tablet Take 1 tablet (10 mg total) by mouth every six (6)  hours as needed (nausea or vomitting). 30 tablet 2   ??? simethicone (GAS RELIEF) 80 MG chewable tablet Chew 80 mg.     ??? sucralfate (CARAFATE) 1 gram tablet Take 1 g by mouth Four (4) times a day.     ??? tacrolimus (PROGRAF) 1 MG capsule Take 3 capsules (3mg ) in the morning and 3 capsules (3mg ) at night. Z94.0, please keep on same generic if possible 180 capsule 11   ??? tamsulosin (FLOMAX) 0.4 mg capsule TAKE 1 CAPSULE (0.4 MG TOTAL) BY MOUTH TWO (2) TIMES A DAY. 180 capsule 3   ??? allopurinol (ZYLOPRIM) 100 MG tablet TAKE 1 TABLET (100 MG TOTAL) BY MOUTH DAILY. (Patient not taking: Reported on 07/24/2017) 90 tablet 3   ??? diphenhydrAMINE (BENADRYL) 25 mg capsule Take 25 mg by mouth.     ??? finasteride (PROSCAR) 5 mg tablet TAKE 1 TABLET (5 MG TOTAL) BY MOUTH DAILY. (Patient not taking: Reported on 07/24/2017) 90 tablet 3   ??? furosemide (LASIX) 20 MG tablet 1-2 tablets daily as needed (Patient not taking: Reported on 07/24/2017) 60 tablet 11   ??? hydroCHLOROthiazide (HYDRODIURIL) 25 MG tablet Take 25 mg by mouth.     ??? hydroxypropyl methylcellulose (ISOPTO TEARS) 2.5 % ophthalmic solution 1 drop as needed.     ??? lactulose (CHRONULAC) 10 gram/15 mL solution Take 10 mL by mouth.     ??? lenalidomide (REVLIMID) 2.5 mg cap capsule Take 1 capsule (2.5 mg total) by mouth daily. (Patient not taking: Reported on 07/24/2017) 21 capsule 0   ??? miscellaneous medical supply (C-TUB) Misc Take 1 Bottle by mouth.     ??? ondansetron (ZOFRAN) 8 MG tablet Take 1 tablet (8 mg total) by mouth every eight (8) hours as needed for nausea (or vomiting). (Patient not taking: Reported on 07/10/2017) 30 tablet 2   ??? oxyCODONE-acetaminophen (PERCOCET) 5-325 mg per tablet Take 5-325 tablets by mouth.     ??? predniSONE (DELTASONE) 5 MG tablet TAKE 1 TABLET (5 MG TOTAL) BY MOUTH DAILY. 90 tablet 3     No current facility-administered medications for this visit.        ALLERGIES:  Allergies   Allergen Reactions   ??? Oxycodone-Acetaminophen Other (See Comments)     Reaction: paranoia, hallucinations       SOCIAL HISTORY:  Social History     Social History   ??? Marital status: Single     Spouse name: N/A   ??? Number of children: N/A   ??? Years of education: N/A     Social History Main Topics   ??? Smoking status: Former Smoker     Quit date: 03/17/1980   ??? Smokeless tobacco: Never Used   ??? Alcohol use No   ??? Drug use: No   ??? Sexual activity: Not Asked     Other Topics Concern   ??? None     Social History Narrative   ??? None       FAMILY HISTORY:  Family History   Problem Relation Age of Onset   ??? Cancer Mother    ??? Anesthesia problems Neg Hx        REVIEW OF SYSTEMS:  See HPI. A 10 system ROS is otherwise negative.    VITAL SIGNS:   Vitals:    07/24/17 1101   BP: 137/74   Pulse: 76   Resp: 16   Temp: 36.3 ??C (97.4 ??F)   TempSrc: Oral   SpO2: 100%   Weight: 50.1 kg (110  lb 6.4 oz)     EXAM:  ECOG: 2  CONST: Frail, thin, slightly tachypneic, awake, alert  HEENT: mucous membranes dry; +ecchymoses below tongue; no frank mucositis noted  RESP: Clear to auscultation bilaterally.  CV: Irregular rhythm. No rubs, gallops or murmurs.   GI: Soft, nontender, nondistended.  MSK: trace edema  NEURO: No focal deficits, answering all questions appropriately    LABORATORY:  Lab on 07/24/2017   Component Date Value Ref Range Status   ??? Sodium 07/24/2017 131* 135 - 145 mmol/L Final   ??? Potassium 07/24/2017 5.5* 3.5 - 5.0 mmol/L Final   ??? Chloride 07/24/2017 101  98 - 107 mmol/L Final   ??? CO2 07/24/2017 21.0* 22.0 - 30.0 mmol/L Final   ??? BUN 07/24/2017 40* 7 - 21 mg/dL Final   ??? Creatinine 07/24/2017 2.01* 0.70 - 1.30 mg/dL Final   ??? BUN/Creatinine Ratio 07/24/2017 20   Final   ??? EGFR MDRD Non Af Amer 07/24/2017 32* >=60 mL/min/1.24m2 Final   ??? EGFR MDRD Af Amer 07/24/2017 39* >=60 mL/min/1.86m2 Final   ??? Anion Gap 07/24/2017 9  9 - 15 mmol/L Final   ??? Glucose 07/24/2017 85  65 - 179 mg/dL Final   ??? Calcium 16/03/9603 10.5* 8.5 - 10.2 mg/dL Final   ??? Albumin 54/02/8118 3.5  3.5 - 5.0 g/dL Final   ??? Total Protein 07/24/2017 5.4* 6.5 - 8.3 g/dL Final   ??? Total Bilirubin 07/24/2017 0.8  0.0 - 1.2 mg/dL Final   ??? Bilirubin, Direct 07/24/2017 0.40  0.00 - 0.40 mg/dL Final   ??? AST 14/78/2956 52  19 - 55 U/L Final   ??? ALT 07/24/2017 17* 19 - 72 U/L Final   ??? Alkaline Phosphatase 07/24/2017 69  38 - 126 U/L Final   ??? Magnesium 07/24/2017 1.9  1.6 - 2.2 mg/dL Final   ??? Phosphorus 07/24/2017 2.9  2.9 - 4.7 mg/dL Final   ??? Triglycerides 07/24/2017 121  1 - 149 mg/dL Final   ??? Cholesterol 07/24/2017 164  100 - 199 mg/dL Final   ??? HDL 21/30/8657 40  40 - 59 mg/dL Final   ??? LDL Calculated 07/24/2017 846* 60 - 99 mg/dL Final      NHLBI Recommended Ranges, LDL Cholesterol, for Adults (20+yrs) (ATPIII), mg/dL  Optimal              <962  Near Optimal        100-129  Borderline High     130-159  High                160-189  Very High            >=190  NHLBI Recommended Ranges, LDL Cholesterol, for Children (2-19 yrs), mg/dL  Desirable            <952  Borderline High     110-129  High                 >=130     ??? VLDL Cholesterol Cal 07/24/2017 24.2  12 - 42 mg/dL Final   ??? Chol/HDL Ratio 07/24/2017 4.1  <8.4 Final   ??? Non-HDL Cholesterol 07/24/2017 124  mg/dL Final      Non-HDL Cholesterol Recommended Ranges (mg/dL)  Optimal       <132  Near Optimal 130 - 159  Borderline High 160 - 189  High             190 - 219  Very High       >  220     ??? FASTING 07/24/2017 Unknown   Final   ??? WBC 07/24/2017 1.0* 4.5 - 11.0 10*9/L Final   ??? RBC 07/24/2017 3.26* 4.50 - 5.90 10*12/L Final   ??? HGB 07/24/2017 9.0* 13.5 - 17.5 g/dL Final   ??? HCT 16/03/9603 27.1* 41.0 - 53.0 % Final   ??? MCV 07/24/2017 83.1  80.0 - 100.0 fL Final   ??? MCH 07/24/2017 27.7  26.0 - 34.0 pg Final   ??? MCHC 07/24/2017 33.4  31.0 - 37.0 g/dL Final   ??? RDW 54/02/8118 18.3* 12.0 - 15.0 % Final   ??? MPV 07/24/2017 7.1  7.0 - 10.0 fL Final   ??? Platelet 07/24/2017 91* 150 - 440 10*9/L Final   ??? Variable HGB Concentration 07/24/2017 Slight* Not Present Final   ??? Absolute Neutrophils 07/24/2017 0.7* 2.0 - 7.5 10*9/L Final   ??? Absolute Lymphocytes 07/24/2017 0.2* 1.5 - 5.0 10*9/L Final   ??? Absolute Monocytes 07/24/2017 0.1* 0.2 - 0.8 10*9/L Final   ??? Absolute Eosinophils 07/24/2017 0.0  0.0 - 0.4 10*9/L Final   ??? Absolute Basophils 07/24/2017 0.0  0.0 - 0.1 10*9/L Final   ??? Large Unstained Cells 07/24/2017 3  0 - 4 % Final   ??? Microcytosis 07/24/2017 Slight* Not Present Final   ??? Anisocytosis 07/24/2017 Moderate* Not Present Final   ??? Hypochromasia 07/24/2017 Slight* Not Present Final   ??? Smear Review Comments 07/24/2017 See Comment* Undefined Final    Slide reviewed  Irregularly contracted RBCs present.     ??? Ovalocytes 07/24/2017 Moderate* Not Present Final   ??? Poikilocytosis 07/24/2017 Marked* Not Present Final     IMAGING:  CT-CAP (07/10/17):  --Left lung pulmonary nodules measuring up to 2.7 cm in the left lower lobe; concerning for metastatic disease.  --Stable sclerosis of the C6 and T2 vertebral bodies, indeterminate. Recommend attention on follow-up.  --Small pericardial effusion.  --Marked interval increase in multiple mesenteric and retroperitoneal nodal masses. The largest of these measures up to 9.5 cm centered in the left psoas muscle, with mass effect upon the aorta and adjacent structures. Measurements somewhat limited due to lack of IV contrast.  --New hepatic masses and marked interval enlargement of splenic masses.  --Indeterminate 1.5 cm exophytic lesion in the right mid kidney.  --Indeterminate lucent/soft tissue lesion in the left superior acetabulum with cortical breakthrough, similar to prior PET-CT, not FDG avid at that time; this could predispose to pathologic fracture.    PET-CT (01/14/17):  1. Marked progression of disease as evidenced by progressive bulky mesenteric adenopathy, new low left internal jugular/left juxtadiaphragmatic/abdominal retroperitoneal adenopathy, new pulmonary  nodules, new hepatic and splenic lesions and new peritoneal nodules, all of which are hypermetabolic.    PET-CT (10/29/16):  Hypermetabolic splenic foci and mesenteric hypermetabolic masses, worrisome for lymphoma involvement. Lymphoma involvement in subtle retroperitoneal lymph nodes is also suspected.  0.8 cm hypermetabolic nodule in the right kidney, worrisome for a primary renal malignancy process. Further evaluation is recommended.    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

## 2017-07-24 NOTE — Unmapped (Signed)
General Medicine History and Physical    Assessment/Plan:    Active Problems:    Kidney replaced by transplant    Pain, generalized    Intractable hiccups      Taylor Ramos is a 77 y.o. male with PMHx of widely metastatic PTLD, ESRD s/p dd renal transplant in 2006 on Tacrolimus and Prednison, Afibb, Severe osteoarthritis, presents to [facility] with increased pain, and intractable hiccups.     #Hiccups: Has been a chronic issue for years. Has tried Baclofen in the past, but unable to tolerate due to hallucinations. Likely related to a phrenic nerve irritation 2/2 widely metastatic disease. CT can on 07/09/2017 with Splenic masses measuring 4.9 cm and 5.5 cm, and Liver with multiple large masses as well, in addition to enlarged LAD. GERD can also be associated with this problem, but pt is compliant with daily BID PPI therapy. Pt prev on Tramadol which is associated, and also received Dex as part of chemo which also associated. But most likely irritant is likely mass effect from know widely metastatic/progressive PTLD. Baclofen, Gabapentin are first line agents. Difficulty given pts prior reports of hallucinations. Some case reports support use of Baclofen combined with low dose Olanzapine, which be helpful for this particular situation. Would also consider other possibilities, such as phrenic nerve block, but will begin with medical therapy while in pt and use this time to observe tolerance of therapy.   -Baclofen 5 mg TID PRN  - Zyprexa 2.5 mg PRN     #Pain: Pt with long hx of chronic low back pain, knee pain, hip pain, shoulder pain. With hx of severe OA. Degenerative changes at L1-L2 and L2-3, and Advanced OA of the right hip, as well as Sacroiliac osteoarthritis, noted on prior xrays. No signs of fractures on recent imaging. No red flag signs of cord compression or cauda equina syndrome. Exam reassuring against cord compression as well. Pt has tried oxycodone and tramadol in the past for pain, which caused hallucinations, per pt. Will try safer adjunctive pain control for now, with very low dose Oxy PRN for breakthrough pain. In addition, seems reasonable to involve palliative care at this point, given severity/complexity of pt's symptom management, and given nature of his progressive disease with no plans to continue treatment. The option of hospice has been discussed with pt. He understands the nature of his disease, and that further treatment is not recommended at this time, and it will very likely lead to his death. He is not interested in hospice at this point, but is willing to continue conversations about how to improve the quality of his remaining time, as well as continued discussions of code status.   - Voltaren gel, Lido patch, and scheduled Tylenol   - Oxy 2.5 mg q4 hours PRN   - Pall care c/s on Monday (not here on weekend), appreciate assistance with symptom management and GoC discussion     #Weight loss/Failure to Thrive: Decreased appetite and weight loss that is getting worse over last 2 months. Likely related to chronic pain, and progression of underlying PTLD.   -Continuing home Megace for now.   - IVF  - Nutrition C/s  - GoC discussion     #Pancytopenia : In setting of progressive PTLD and recent Lenalidomide. No signs or symptoms of bleeding on admission. WBC 1, and ANC 0.7. Hg 9.   - CTM with daily CBC with Diff   - Type and Screen   - Transfuse <7 and Plts <10    #  PTLD: Presented in to [facility] in summer of 2018 for 2nd opinion regarding his advanced, relapsed PTLD with involvement in the mesentery, L IJ LN, pulmonary nodules, peritoneum . He is s/p R-miniCHOP x6 (10/2016), O-GemOx x2 (02/04/17-02/24/17), R2 x31mo (03/2017-04/2017). Recently had been on Lenalidomide + Ritux. He did tolerate the Lenalidomide due to profuse diarrhea and pancytopenia. His recent CT scan on 07/09/2017 show cont progression of his disease with newer/adv masses in liver, spleen, left psoas, left superior acetabulum. Per review of his clinic notes, pt's prognosis is very poor, and there really no cancer-directed therapies left.   - As noted above, GoC discussion on-going   - Pall care c/s, as noted above, appreciate assistance.     #ESRD s/p dd renal transplant in 2006 w AKI on CKD noted: Cr is 2.01, which is above baseline of ~1.4. This rise appears to have been going on over last 3-4 weeks on review lab values. Pt does report low PO intake, so possible prerenal component. Also on Tacrolimus, and seems compliant with med, and no Tac Level since 12/2016, so could be related to subtherapeutic level. Also with hx of c/f rejeciton in 2016, so this may represent acute rejection as well. CT on 07/09/2017 with no signs of hydronephrosis at that time. Pt reports good UOP, compliant with Tamsulosin.   - F/u urine lytes   - IVF    - Nephro c/s, appreciate assistance   - Hx of c/f rejection in 2016, will cont Tac for now. F/u Tac trough.   - Cont home pred 5 mg daily   - Cont Flomax   - Daily BMP, Mg, Phos     #Afibb: rate controlled on Metoprolol 12.5 BID.     #Gout: No recent flares, no warm erythematous joints on exam. Cont Allopurinol 200 daily     Diet: Regular   DVT PPX: [facility]  Code: Full code. HCPOA is wife Vincente Liberty [phone].       Code Status:  Full Code  ___________________________________________________________________    Chief Complaint  Hiccups    HPI:  Taylor Ramos is a 77 y.o. male with PMHx of widely metastatic PTLD, ESRD s/p dd renal transplant in 2006 on Tacrolimus and Prednison, Afibb, Severe osteoarthritis, presents to [facility] with increased pain, and intractable hiccups.     Pt is accompanied by his wife who helped provide hx. Pt and wife explain that pt has had on going pain in hips, knees, and back that has present for years. This has been evaluated in the past by various doctors, including ortho. He was dx with severe OA of hips, and has received injections in hip in the past but this was years ago. They have tried Oxy and Tramadol, which unfortunately caused hallucinations. The pain has been worse over last couple of weeks, and pt states pain is particularly worse in his L knee. The pain is worse with walking, but also present whne lying in bed. Denied fevers, bowel/bladder incontinence, numbness/tingling. No recent falls.  The joints do not get swollen, denies rashes.     Pt has had weakness, which he and wife describe as generalized and wife thinks this is due to poor PO intake over last several weeks. Previously when he was eating well, he did not have any weakness. Would take a bath and get dressed alone. Now he has been weak for the past 7 days. But he has had poor appetite over the last year. He is down 7 lb since 07/10/2017. For  Breakfast: oatmeal, grits, and eggs, Lunch: maybe a biscuit, sometimes eggs. He denies n/v. Does report recent diarrhea from chemo, but this has improved.     What is most distressing to pt is hiccups for the last month. The hiccups come on suddenly, and can become so severe that he feels like he cannot breath. A pill was starting to help (baclofen) but unfortunately it also made him hallucinate. This symptom is making it very difficult to sleep. Pt hopes that he can get the hiccups resolved, then work on moving forward with getting his hernia repaired which also causes him a lot of discomfort, since he has to hold it in while walking.       Allergies:  Oxycodone-acetaminophen    Medications:   Prior to Admission medications    Medication Dose, Route, Frequency   acetaminophen (TYLENOL) 325 MG tablet Oral, Every 6 hours PRN   albuterol (PROAIR HFA) 90 mcg/actuation inhaler 2 puffs, Inhalation, Every 4 hours PRN   allopurinol (ZYLOPRIM) 100 MG tablet 200 mg, Oral, Daily (standard)   aspirin 81 MG chewable tablet 81 mg, Oral, Daily (standard)   carboxymethylcellulose sodium (THERATEARS) 0.25 % Drop 2 drops, Both Eyes, 4 times daily PRN   colchicine 0.6 mg cap capsule 0.6 mg, Oral, Daily (standard) lenalidomide (REVLIMID) 2.5 mg cap capsule 2.5 mg, Oral, Daily (standard)   megestrol (MEGACE) 400 mg/10 mL (40 mg/mL) suspension 200 mg, Oral   metoprolol tartrate (LOPRESSOR) 25 MG tablet 12.5 mg, Oral, 2 times a day (standard)   miscellaneous medical supply (C-TUB) Misc 1 Bottle, Oral   multivitamin (MULTIVITAMIN) per tablet 1 tablet, Oral, Daily (standard)   ondansetron (ZOFRAN) 8 MG tablet 8 mg, Oral, Every 8 hours PRN   oxybutynin (DITROPAN-XL) 5 MG 24 hr tablet 5 mg, Oral, Daily (standard), TAKE 1 TABLET (5 MG TOTAL) BY MOUTH DAILY.   pantoprazole (PROTONIX) 40 MG tablet TAKE 1 TABLET BY MOUTH TWICE A DAY   predniSONE (DELTASONE) 5 MG tablet 5 mg, Oral, Daily (standard)   prochlorperazine (COMPAZINE) 10 MG tablet 10 mg, Oral, Every 6 hours PRN   simethicone (GAS RELIEF) 80 MG chewable tablet 80 mg, Oral   sucralfate (CARAFATE) 1 gram tablet 1 g, Oral, 4 times a day   tacrolimus (PROGRAF) 1 MG capsule Take 3 capsules (3mg ) in the morning and 3 capsules (3mg ) at night. Z94.0, please keep on same generic if possible   tamsulosin (FLOMAX) 0.4 mg capsule 0.4 mg, Oral, 2 times a day   hydroxypropyl methylcellulose (ISOPTO TEARS) 2.5 % ophthalmic solution 1 drop, As needed (once a day)       Medical History:  Past Medical History:   Diagnosis Date   ??? Arthritis    ??? Chronic kidney disease    ??? Coronary artery disease    ??? Gout    ??? History of transfusion    ??? Hypertension    ??? Kidney transplant status, cadaveric 2006   ??? Peptic ulceration        Surgical History:  Past Surgical History:   Procedure Laterality Date   ??? BACK SURGERY     ??? JOINT REPLACEMENT     ??? KNEE SURGERY     ??? NEPHRECTOMY TRANSPLANTED ORGAN     ??? PR COLONOSCOPY FLX DX W/COLLJ SPEC WHEN PFRMD N/A 07/15/2016    Procedure: COLONOSCOPY, FLEXIBLE, PROXIMAL TO SPLENIC FLEXURE; DIAGNOSTIC, W/WO COLLECTION SPECIMEN BY BRUSH OR WASH;  Surgeon: Maris Berger, MD;  Location: [facility]URES [facility];  Service: Gastroenterology   ??? PR COLSC FLX W/RMVL OF TUMOR POLYP LESION SNARE TQ Left 03/17/2013    Procedure: COLONOSCOPY FLEX; W/REMOV TUMOR/LES BY SNARE;  Surgeon: Malcolm Metro, MD;  Location: [facility]URES [facility];  Service: Gastroenterology   ??? PR SMALL BOWEL ENDOSCOPY,BIOPSY N/A 07/15/2016    Procedure: SM INTESTINAL ENDO NOT ILEUM; W/BX 1/MX;  Surgeon: Maris Berger, MD;  Location: [facility];  Service: Gastroenterology   ??? PR THORACENTESIS NEEDLE/CATH PLEURA W/IMAGING N/A 07/20/2015    Procedure: THORACENTESIS W/ IMAGING;  Surgeon: Sherwood Gambler, MD;  Location: BRONCH PROCEDURE LAB [facility]  Service: Pulmonary   ??? PR UP GI ENDOSCOPY,REMV TUMOR,SNARE N/A 11/23/2015    Procedure: UGI ENDO; W/REMOV TUMOR/POLYP/OTHER LES-SNARE;  Surgeon: Chriss Driver, MD;  Location: [facility] PROC[facility]URES [facility];  Service: Gastroenterology   ??? PR UPPER GI ENDOSCOPY,BIOPSY N/A 03/13/2015    Procedure: UGI ENDOSCOPY; WITH BIOPSY, SINGLE OR MULTIPLE;  Surgeon: Collie Siad, MD;  Location: [facility];  Service: Gastroenterology   ??? PR UPPER GI ENDOSCOPY,BIOPSY N/A 12/26/2015    Procedure: UGI ENDOSCOPY; WITH BIOPSY, SINGLE OR MULTIPLE;  Surgeon: Mayford Knife, MD;  Location: [facility];  Service: Gastroenterology   ??? PR UPPER GI ENDOSCOPY,DIAGNOSIS N/A 03/17/2013    Procedure: UGI ENDO, INCLUDE ESOPHAGUS, STOMACH, & DUODENUM &/OR JEJUNUM; DX W/WO COLLECTION SPECIMN, BY BRUSH OR WASH;  Surgeon: Malcolm Metro, MD;  Location: [facility]URES [facility];  Service: Gastroenterology       Social History:  Tobacco use:   reports that he quit smoking about 37 years ago. He has never used smokeless tobacco.  Alcohol use:   reports that he does not drink alcohol.  Drug use:  reports that he does not use drugs.  Living situation: the patient lives in [place] with Dewayne Hatch (Wife) and granddaughter Jasmine 20. Jasmine works at [employer]. .    Family History:  Family History   Problem Relation Age of Onset   ??? Cancer Mother ??? Anesthesia problems Neg Hx        Review of Systems:  10 systems reviewed and are negative unless otherwise mentioned in HPI      Physical Exam:  Temp:  [36.3 ??C] 36.3 ??C  Heart Rate:  [76-80] 80  Resp:  [16-18] 18  BP: (136-137)/(60-74) 136/60  SpO2:  [100 %] 100 %  Body mass index is 17.8 kg/m??.    GEN: chronically ill-appearing, frail. Temporal wasting and thenar eminence atrophy. NAD   HEENT: EOMI. No scleral icterus. Normal oropharynx without any posterior erythema or exudates.    CARDIO: irregular rhythm, no murmurs, rubs, or gallops.   BACK: No midline TTP, no palpable masses, no CVA tenderness.   RESPIRATORY: Clear bilaterally, no wheezes, rales, or rhonchi.   ABDOMEN: Bowel sounds present, soft, non-tender, non-distended.    EXTREMITIES:  no peripheral edema.   NEURO: CN II-XII grossly intact. Strength and sensation to light touch grossly intact throughout. Strength -4/5 in LE b/l. DTR reflexes diminished by symmetric b/l, no clonus.       Test Results:  Data Review:    All lab results last 24 hours:    Recent Results (from the past 24 hour(s))   Basic Metabolic Panel    Collection Time: 07/24/17 10:36 AM   Result Value Ref Range    Sodium 131 (L) 135 - 145 mmol/L    Potassium 5.5 (H) 3.5 - 5.0 mmol/L    Chloride  101 98 - 107 mmol/L    CO2 21.0 (L) 22.0 - 30.0 mmol/L    BUN 40 (H) 7 - 21 mg/dL    Creatinine 2.95 (H) 0.70 - 1.30 mg/dL    BUN/Creatinine Ratio 20     EGFR MDRD Non Af Amer 32 (L) >=60 mL/min/1.45m2    EGFR MDRD Af Amer 39 (L) >=60 mL/min/1.57m2    Anion Gap 9 9 - 15 mmol/L    Glucose 85 65 - 179 mg/dL    Calcium 62.1 (H) 8.5 - 10.2 mg/dL   Hepatic Function Panel    Collection Time: 07/24/17 10:36 AM   Result Value Ref Range    Albumin 3.5 3.5 - 5.0 g/dL    Total Protein 5.4 (L) 6.5 - 8.3 g/dL    Total Bilirubin 0.8 0.0 - 1.2 mg/dL    Bilirubin, Direct 3.08 0.00 - 0.40 mg/dL    AST 52 19 - 55 U/L    ALT 17 (L) 19 - 72 U/L    Alkaline Phosphatase 69 38 - 126 U/L   Magnesium Level Collection Time: 07/24/17 10:36 AM   Result Value Ref Range    Magnesium 1.9 1.6 - 2.2 mg/dL   Phosphorus Level    Collection Time: 07/24/17 10:36 AM   Result Value Ref Range    Phosphorus 2.9 2.9 - 4.7 mg/dL   Tacrolimus Level, Trough    Collection Time: 07/24/17 10:36 AM   Result Value Ref Range    Tacrolimus, Trough 17.7 <=20.0 ng/mL   Lipid panel    Collection Time: 07/24/17 10:36 AM   Result Value Ref Range    Triglycerides 121 1 - 149 mg/dL    Cholesterol 657 846 - 199 mg/dL    HDL 40 40 - 59 mg/dL    LDL Calculated 962 (H) 60 - 99 mg/dL    VLDL Cholesterol Cal 24.2 12 - 42 mg/dL    Chol/HDL Ratio 4.1 <9.5    Non-HDL Cholesterol 124 mg/dL    FASTING Unknown    CBC w/ Differential    Collection Time: 07/24/17 10:36 AM   Result Value Ref Range    WBC 1.0 (L) 4.5 - 11.0 10*9/L    RBC 3.26 (L) 4.50 - 5.90 10*12/L    HGB 9.0 (L) 13.5 - 17.5 g/dL    HCT 28.4 (L) 13.2 - 53.0 %    MCV 83.1 80.0 - 100.0 fL    MCH 27.7 26.0 - 34.0 pg    MCHC 33.4 31.0 - 37.0 g/dL    RDW 44.0 (H) 10.2 - 15.0 %    MPV 7.1 7.0 - 10.0 fL    Platelet 91 (L) 150 - 440 10*9/L    Variable HGB Concentration Slight (A) Not Present    Absolute Neutrophils 0.7 (L) 2.0 - 7.5 10*9/L    Absolute Lymphocytes 0.2 (L) 1.5 - 5.0 10*9/L    Absolute Monocytes 0.1 (L) 0.2 - 0.8 10*9/L    Absolute Eosinophils 0.0 0.0 - 0.4 10*9/L    Absolute Basophils 0.0 0.0 - 0.1 10*9/L    Large Unstained Cells 3 0 - 4 %    Microcytosis Slight (A) Not Present    Anisocytosis Moderate (A) Not Present    Hypochromasia Slight (A) Not Present   Morphology Review    Collection Time: 07/24/17 10:36 AM   Result Value Ref Range    Smear Review Comments See Comment (A) Undefined    Ovalocytes Moderate (A) Not Present    Poikilocytosis  Marked (A) Not Present       Imaging: Radiology studies were personally reviewed

## 2017-07-25 LAB — CBC W/ AUTO DIFF
EOSINOPHILS ABSOLUTE COUNT: 0 10*9/L (ref 0.0–0.4)
HEMATOCRIT: 25.7 % — ABNORMAL LOW (ref 41.0–53.0)
HEMOGLOBIN: 8.2 g/dL — ABNORMAL LOW (ref 13.5–17.5)
LARGE UNSTAINED CELLS: 1 % (ref 0–4)
LYMPHOCYTES ABSOLUTE COUNT: 0.1 10*9/L — ABNORMAL LOW (ref 1.5–5.0)
MEAN CORPUSCULAR HEMOGLOBIN CONC: 31.7 g/dL (ref 31.0–37.0)
MEAN CORPUSCULAR HEMOGLOBIN: 26.7 pg (ref 26.0–34.0)
MEAN PLATELET VOLUME: 7.4 fL (ref 7.0–10.0)
MONOCYTES ABSOLUTE COUNT: 0 10*9/L — ABNORMAL LOW (ref 0.2–0.8)
NEUTROPHILS ABSOLUTE COUNT: 0.5 10*9/L — ABNORMAL LOW (ref 2.0–7.5)
PLATELET COUNT: 102 10*9/L — ABNORMAL LOW (ref 150–440)
RED BLOOD CELL COUNT: 3.06 10*12/L — ABNORMAL LOW (ref 4.50–5.90)
RED CELL DISTRIBUTION WIDTH: 18.7 % — ABNORMAL HIGH (ref 12.0–15.0)
WBC ADJUSTED: 0.6 10*9/L — ABNORMAL LOW (ref 4.5–11.0)

## 2017-07-25 LAB — CO2: Carbon dioxide:SCnc:Pt:Ser/Plas:Qn:: 20 — ABNORMAL LOW

## 2017-07-25 LAB — BASIC METABOLIC PANEL
ANION GAP: 6 mmol/L — ABNORMAL LOW (ref 9–15)
BLOOD UREA NITROGEN: 46 mg/dL — ABNORMAL HIGH (ref 7–21)
BLOOD UREA NITROGEN: 53 mg/dL — ABNORMAL HIGH (ref 7–21)
BUN / CREAT RATIO: 17
BUN / CREAT RATIO: 20
CALCIUM: 9.8 mg/dL (ref 8.5–10.2)
CALCIUM: 9.4 mg/dL (ref 8.5–10.2)
CHLORIDE: 105 mmol/L (ref 98–107)
CHLORIDE: 104 mmol/L (ref 98–107)
CO2: 18 mmol/L — ABNORMAL LOW (ref 22.0–30.0)
CO2: 20 mmol/L — ABNORMAL LOW (ref 22.0–30.0)
CREATININE: 2.68 mg/dL — ABNORMAL HIGH (ref 0.70–1.30)
CREATININE: 2.69 mg/dL — ABNORMAL HIGH (ref 0.70–1.30)
EGFR MDRD AF AMER: 28 mL/min/{1.73_m2} — ABNORMAL LOW (ref >=60)
EGFR MDRD AF AMER: 28 mL/min/{1.73_m2} — ABNORMAL LOW (ref >=60)
GLUCOSE RANDOM: 106 mg/dL (ref 65–179)
GLUCOSE RANDOM: 145 mg/dL (ref 65–179)
POTASSIUM: 5.7 mmol/L — ABNORMAL HIGH (ref 3.5–5.0)
POTASSIUM: 5.6 mmol/L — ABNORMAL HIGH (ref 3.5–5.0)
SODIUM: 132 mmol/L — ABNORMAL LOW (ref 135–145)
SODIUM: 130 mmol/L — ABNORMAL LOW (ref 135–145)

## 2017-07-25 LAB — PHOSPHORUS: Phosphate:MCnc:Pt:Ser/Plas:Qn:: 4.5

## 2017-07-25 LAB — BASOPHILS ABSOLUTE COUNT: Lab: 0

## 2017-07-25 LAB — SODIUM URINE: Lab: 50

## 2017-07-25 LAB — CREATININE, URINE: Lab: 177.4

## 2017-07-25 LAB — MAGNESIUM: Magnesium:MCnc:Pt:Ser/Plas:Qn:: 1.9

## 2017-07-25 LAB — TACROLIMUS, TROUGH: Lab: 6.5

## 2017-07-25 LAB — BUN / CREAT RATIO: Urea nitrogen/Creatinine:MRto:Pt:Ser/Plas:Qn:: 17

## 2017-07-25 NOTE — Unmapped (Signed)
Problem: Patient Care Overview  Goal: Plan of Care Review  Outcome: Progressing   07/25/17 0624   OTHER   Plan of Care Reviewed With patient   Plan of Care Review   Progress no change     VSS throughout this shift. Pt remains free from falls. Bed alarm activated and audible, as pt is unsteady on his feet and high falls risk. Pt educated on use of call bell to ask for assistance OOB; pt expresses understanding. Pt with diarrhea x1 this AM, very small amount. Pt states that he took a laxative at home, cannot recall how long ago this was; c diff sample sent and pt placed on r/o enteric precautions. Pt also remains on r/o VRE. Urine sent. 0600 labs sent. NS infusion maintained @ 100 ml/hr per order. Pt with very low urine output this shift; urinated 125 ml into urinal. Post-void residual bladder scan showed only 66 ml. Pt also given Baclofen x1 for complaint of hiccups. Pt endorses little pain this shift; manageable with scheduled Tylenol and Voltaren gel to knee. Pt currently resting in bed with eyes closed; no further needs expressed at this time. Will continue to monitor.      Goal: Individualization and Mutuality  Outcome: Progressing   07/25/17 1610   Individualization   Patient Specific Preferences Cluster care; Baclofen for hiccups   Patient Specific Interventions Labs sent; c diff sample sent; bladder scan   OTHER   What Anxieties, Fears, Concerns, or Questions Do You Have About Your Care? Prognosis   How Would You and/or Your Support Person Like to Participate in Your Care? Discussion of POC with care team     Goal: Discharge Needs Assessment  Outcome: Progressing    Goal: Interprofessional Rounds/Family Conf  Outcome: Progressing   07/25/17 0624   Interdisciplinary Rounds/Family Conf   Participants nursing;patient       Problem: Self-Care Deficit (Adult,Obstetrics,Pediatric)  Goal: Identify Related Risk Factors and Signs and Symptoms  Related risk factors and signs and symptoms are identified upon initiation of Human Response Clinical Practice Guideline (CPG).   Outcome: Progressing   07/25/17 0440   Self-Care Deficit (Adult,Obstetrics,Pediatric)   Related Risk Factors (Self-Care Deficit) fatigue/weakness;immobility;pain/discomfort   Signs and Symptoms (Self-Care Deficit) decreased balance;decreased functional activity tolerance;dyspnea on exertion;gait unstable;requests assistance;weakness, paresis, paralysis     Goal: Improved Ability to Perform BADL and IADL  Patient will demonstrate the desired outcomes by discharge/transition of care.   Outcome: Progressing   07/25/17 0624   Self-Care Deficit (Adult,Obstetrics,Pediatric)   Improved Ability to Perform BADL and IADL making progress toward outcome       Problem: Fall Risk (Adult)  Goal: Identify Related Risk Factors and Signs and Symptoms  Related risk factors and signs and symptoms are identified upon initiation of Human Response Clinical Practice Guideline (CPG).   Outcome: Progressing   07/25/17 0440   Fall Risk (Adult)   Related Risk Factors (Fall Risk) age-related changes;bladder function altered;fatigue/slow reaction;gait/mobility problems;environment unfamiliar   Signs and Symptoms (Fall Risk) presence of risk factors     Goal: Absence of Fall  Patient will demonstrate the desired outcomes by discharge/transition of care.   Outcome: Progressing   07/25/17 0624   Fall Risk (Adult)   Absence of Fall making progress toward outcome

## 2017-07-25 NOTE — Unmapped (Signed)
Problem: Self-Care Deficit (Adult,Obstetrics,Pediatric)  Goal: Identify Related Risk Factors and Signs and Symptoms  Related risk factors and signs and symptoms are identified upon initiation of Human Response Clinical Practice Guideline (CPG).   Outcome: Progressing   07/25/17 0440   Self-Care Deficit (Adult,Obstetrics,Pediatric)   Related Risk Factors (Self-Care Deficit) fatigue/weakness;immobility;pain/discomfort   Signs and Symptoms (Self-Care Deficit) decreased balance;decreased functional activity tolerance;dyspnea on exertion;gait unstable;requests assistance;weakness, paresis, paralysis     Goal: Improved Ability to Perform BADL and IADL  Patient will demonstrate the desired outcomes by discharge/transition of care.   Outcome: Progressing   07/25/17 0440   Self-Care Deficit (Adult,Obstetrics,Pediatric)   Improved Ability to Perform BADL and IADL making progress toward outcome       Problem: Fall Risk (Adult)  Goal: Identify Related Risk Factors and Signs and Symptoms  Related risk factors and signs and symptoms are identified upon initiation of Human Response Clinical Practice Guideline (CPG).   Outcome: Progressing   07/25/17 0440   Fall Risk (Adult)   Related Risk Factors (Fall Risk) age-related changes;bladder function altered;fatigue/slow reaction;gait/mobility problems;environment unfamiliar   Signs and Symptoms (Fall Risk) presence of risk factors     Goal: Absence of Fall  Patient will demonstrate the desired outcomes by discharge/transition of care.   Outcome: Progressing   07/25/17 0440   Fall Risk (Adult)   Absence of Fall making progress toward outcome

## 2017-07-25 NOTE — Unmapped (Signed)
MedE1 Progress Note    Interval Events-NAEON. Denies fevers, chills, chest pains, dyspnea, n/v, abd pain. is having BM's. is tolerating diet. Slept very well. No hallucinations overnight, after getting Baclofen and Oxy 2.5 mg. Pain is at level 0 this AM. Pt excited to eat his breakfast.     Assessment/Plan:    Active Problems:    Kidney replaced by transplant    Pain, generalized    Intractable hiccups  Resolved Problems:    * No resolved hospital problems. *      Taylor Ramos is a 77 y.o. male with PMHx of widely metastatic PTLD, ESRD s/p dd renal transplant in 2006 on Tacrolimus and Prednison, Afibb, Severe osteoarthritis, presents to [facility] with increased pain, and intractable hiccups.   ??  #GoC: Option of hospice has been discussed with pt. He understands the nature of his disease, and that further treatment is not recommended at this time, and it will very likely lead to his death. After longer discussion with Dr. Jerilee Hoh on 2/2, pt has elected to transition to hospice care and be made DNR/DNI (please see there attestation to this note). Pt cont to emphasize quality over quantity of life, and wishes to maximize time at home with his wife.  - DNR/DNI  - Pall care c/s on Monday. Appreciate help with this transition and any recs on symptoms management.      #ESRD s/p dd renal transplant in 2006 w AKI on CKD noted: Cr cont to trend up to 2.68 this AM. Urine lytes show uNa of 50. FeNA < 1. In setting of renal transplant, on Tac, prior hx of possible cellular rejection in 2016, diarrhea, and poor PO intake. Likely multifactorial.   - s/p IVF    - Strict I/O  - Nephro c/s, appreciate assistance   - Hx of c/f rejection in 2016, will cont Tac for now. F/u Tac trough. Appreciate recs from Nephro.   - Cont home pred 5 mg daily   - Cont Flomax   - BID BMP, Mg, Phos     #Hiccups: Has been a chronic issue for years. Most likely irritant is likely mass effect from know widely metastatic/progressive PTLD. Baclofen, Gabapentin are first line agents. Difficulty given pts prior reports of hallucinations. Also some case reports with use of Zyprexa. No hallucinations so far, he is tolerating well. Hiccups somewhat improved, but still present.   -Baclofen 5 mg TID PRN  - Zyprexa 2.5 mg qhs   ??  #Pain: Pt with long hx of chronic low back pain, knee pain, hip pain, shoulder pain. With hx of severe OA.  No signs of fractures on recent imaging. No red flag signs of cord compression or cauda equina syndrome. Exam reassuring against cord compression as well. Pain currently well controlled on current reg.   - Voltaren gel, Lido patch, and scheduled Tylenol   - Oxy 2.5 mg q4 hours PRN   - Pall care c/s on Monday (not here on weekend), appreciate assistance with symptom management and GoC discussion   ??  #Weight loss/Failure to Thrive: Decreased appetite and weight loss that is getting worse over last 2 months. Likely related to chronic pain, and progression of underlying PTLD.   -Continuing home Megace for now.   - Received IVF on admission   - Nutrition C/s  ??  #Pancytopenia : In setting of progressive PTLD and recent Lenalidomide, as well as Rituxan on 1/18. No signs or symptoms of bleeding on admission. ANC trending  down to 0.5, no need for broad spec abx at this time. Will CTM.   - CTM with daily CBC with Diff   - Type and Screen   - Transfuse <7 and Plts <10  ??  #PTLD: Presented in to [facility] in summer of 2018 for 2nd opinion regarding his advanced, relapsed PTLD with involvement in the mesentery, L IJ LN, pulmonary nodules, peritoneum . He is s/p R-miniCHOP x6 (10/2016), O-GemOx x2 (02/04/17-02/24/17), R2 x59mo (03/2017-04/2017). Recently had been on Lenalidomide + Ritux. He did tolerate the Lenalidomide due to profuse diarrhea and pancytopenia. His recent CT scan on 07/09/2017 show cont progression of his disease with newer/adv masses in liver, spleen, left psoas, left superior acetabulum. Per review of his clinic notes, pt's prognosis is very poor, and there really no cancer-directed therapies left.   - As noted above, GoC discussion on-going   - Pall care c/s, as noted above, appreciate assistance.   ??  #Afibb: rate controlled on Metoprolol 12.5 BID.   ??  #Gout: No recent flares, no warm erythematous joints on exam. Cont Allopurinol 200 daily   ??  Diet: Regular   DVT PPX: [facility]  Code: DNR/DNI. HCPOA is wife Vincente Liberty [phone].   ??  ??  Code Status:  Full Code  ___________________________________________________________________    Labs/Studies:  Labs and Studies from the last 24hrs per EMR and Reviewed    Objective:  Temp:  [36.3 ??C-36.7 ??C] 36.6 ??C  Heart Rate:  [61-76] 61  Resp:  [16-18] 16  BP: (131-164)/(60-71) 137/60  SpO2:  [96 %-100 %] 100 %,   Intake/Output Summary (Last 24 hours) at 07/25/17 1443  Last data filed at 07/25/17 1300   Gross per 24 hour   Intake             1701 ml   Output              125 ml   Net             1576 ml       Patient Lines/Drains/Airways Status    Active Active Lines, Drains, & Airways     Name:   Placement date:   Placement time:   Site:   Days:    Non-Surgical Airway Nasal Cannula  07/15/16    1220        375    Power Port--a-Cath Single Hub Right Chest          Chest                    GEN:  chronically ill-appearing, frail, NAD   HEENT: EOMI, MMM.  CARDIO: irregular rhythm, no murmurs.  RESPIRATORY: Normal work of breathing on RA. Clear to auscultation bilaterally.   ABDOMEN: BS present. Soft, non-tender, non-distended.  EXT: Warm and well-perfused, no edema  SKIN: No diaphoresis, no visible rashes

## 2017-07-25 NOTE — Unmapped (Signed)
Taylor Ramos is a 77 yo gentleman with PTLD that was seen by his primary oncologist today in follow up where it was determined that he required admission for uncontrolled pain.  He has been brought to the infusion center for admission.  IVF bolus and dexamethasone have been ordered.  Inpatient team has been made aware of arrival.  Encounter has been converted and will await bed placement at this time.

## 2017-07-25 NOTE — Unmapped (Signed)
Problem: Patient Care Overview  Goal: Plan of Care Review  Outcome: Progressing  VSS, afebrile. Pt with low UO, team paged with bladder scan result. Pt with minimum intake. On the phone with family this afternoon. Denies any pain this shift. WCM.

## 2017-07-26 LAB — BASIC METABOLIC PANEL
ANION GAP: 6 mmol/L — ABNORMAL LOW (ref 9–15)
ANION GAP: 4 mmol/L — ABNORMAL LOW (ref 9–15)
BLOOD UREA NITROGEN: 52 mg/dL — ABNORMAL HIGH (ref 7–21)
BLOOD UREA NITROGEN: 47 mg/dL — ABNORMAL HIGH (ref 7–21)
BUN / CREAT RATIO: 20
CALCIUM: 10 mg/dL (ref 8.5–10.2)
CHLORIDE: 107 mmol/L (ref 98–107)
CHLORIDE: 107 mmol/L (ref 98–107)
CO2: 20 mmol/L — ABNORMAL LOW (ref 22.0–30.0)
CO2: 22 mmol/L (ref 22.0–30.0)
CREATININE: 2.19 mg/dL — ABNORMAL HIGH (ref 0.70–1.30)
EGFR MDRD AF AMER: 36 mL/min/{1.73_m2} — ABNORMAL LOW (ref >=60)
EGFR MDRD NON AF AMER: 24 mL/min/{1.73_m2} — ABNORMAL LOW (ref >=60)
EGFR MDRD NON AF AMER: 29 mL/min/{1.73_m2} — ABNORMAL LOW (ref >=60)
GLUCOSE RANDOM: 186 mg/dL — ABNORMAL HIGH (ref 65–179)
GLUCOSE RANDOM: 109 mg/dL (ref 65–179)
POTASSIUM: 5 mmol/L (ref 3.5–5.0)
POTASSIUM: 5.3 mmol/L — ABNORMAL HIGH (ref 3.5–5.0)
SODIUM: 133 mmol/L — ABNORMAL LOW (ref 135–145)
SODIUM: 133 mmol/L — ABNORMAL LOW (ref 135–145)

## 2017-07-26 LAB — CBC W/ AUTO DIFF
BASOPHILS ABSOLUTE COUNT: 0 10*9/L (ref 0.0–0.1)
EOSINOPHILS ABSOLUTE COUNT: 0 10*9/L (ref 0.0–0.4)
HEMATOCRIT: 26.1 % — ABNORMAL LOW (ref 41.0–53.0)
LARGE UNSTAINED CELLS: 3 % (ref 0–4)
LYMPHOCYTES ABSOLUTE COUNT: 0.1 10*9/L — ABNORMAL LOW (ref 1.5–5.0)
MEAN CORPUSCULAR HEMOGLOBIN CONC: 31 g/dL (ref 31.0–37.0)
MEAN CORPUSCULAR HEMOGLOBIN: 26.4 pg (ref 26.0–34.0)
MEAN CORPUSCULAR VOLUME: 85.2 fL (ref 80.0–100.0)
MEAN PLATELET VOLUME: 8.8 fL (ref 7.0–10.0)
MONOCYTES ABSOLUTE COUNT: 0.1 10*9/L — ABNORMAL LOW (ref 0.2–0.8)
NEUTROPHILS ABSOLUTE COUNT: 0.7 10*9/L — ABNORMAL LOW (ref 2.0–7.5)
RED BLOOD CELL COUNT: 3.06 10*12/L — ABNORMAL LOW (ref 4.50–5.90)
RED CELL DISTRIBUTION WIDTH: 18.9 % — ABNORMAL HIGH (ref 12.0–15.0)

## 2017-07-26 LAB — ANISOCYTOSIS

## 2017-07-26 LAB — MAGNESIUM
Magnesium:MCnc:Pt:Ser/Plas:Qn:: 1.9
Magnesium:MCnc:Pt:Ser/Plas:Qn:: 2

## 2017-07-26 LAB — PHOSPHORUS
Phosphate:MCnc:Pt:Ser/Plas:Qn:: 2.3 — ABNORMAL LOW
Phosphate:MCnc:Pt:Ser/Plas:Qn:: 3

## 2017-07-26 LAB — TACROLIMUS BLOOD
Lab: 4.9
Lab: 5.1

## 2017-07-26 LAB — ANION GAP: Anion gap 3:SCnc:Pt:Ser/Plas:Qn:: 4 — ABNORMAL LOW

## 2017-07-26 LAB — EGFR MDRD NON AF AMER
Glomerular filtration rate/1.73 sq M.predicted.non black:ArVRat:Pt:Ser/Plas/Bld:Qn:Creatinine-based formula (MDRD): 24 — ABNORMAL LOW

## 2017-07-26 NOTE — Unmapped (Signed)
OCCUPATIONAL THERAPY  Evaluation (07/26/17 1050)    Patient Name:  Taylor Ramos       Medical Record Number: [id number]   Date of Birth: [birth year]  Sex: Male          OT Treatment Diagnosis:  ADL and functional mobility concerns    Assessment  Taylor Ramos is a 77 y.o. male with PMHx of widely metastatic PTLD, ESRD s/p dd renal transplant in 2006 on Tacrolimus and Prednison, Afibb, Severe osteoarthritis, presents to [facility] with increased pain, and intractable hiccups. Pt presents to acute OT demonstrating overall independence for ADL. Recommend supervision for OOB ADL 2/2 hunched posture, PT recommending cane for stability. Based on the daily activity AM-PAC raw score of 21/24, the pt is considered to be 32.79% impaired with self care. Pt has no further skilled acute or post-acute OT needs at this time.         Activity Tolerance During Today's Session  Patient tolerated treatment well    Plan  Planned Frequency of Treatment:  D/C Services for: D/C Services       Post-Discharge Occupational Therapy Recommendations:  OT Post Acute Discharge Recommendations: OT services not indicated    ;    OT DME Recommendations: None    GOALS:   Patient and Family Goals: To go home            Prognosis:  Good  Positive Indicators:  PLOF, CLOF, motivation  Barriers to Discharge: None    Subjective  Current Status Pt rec'd and left reclined in chair, call bell in reach, RN aware, intake rep present at end of session  Prior Functional Status Pt reports independence with ADL and functional mobility PTA. No AD. Enjoys spending time with his wife.    Medical Tests / Procedures: Reviewed chart  Services patient receives: PT  Patient / Caregiver reports: I only came here for the hiccups. I don't know why I'm still here.    Past Medical History:   Diagnosis Date   ??? Arthritis    ??? Chronic kidney disease    ??? Coronary artery disease    ??? Gout    ??? History of transfusion    ??? Hypertension    ??? Kidney transplant status, cadaveric 2006   ??? Peptic ulceration     Social History   Substance Use Topics   ??? Smoking status: Former Smoker     Quit date: 03/17/1980   ??? Smokeless tobacco: Never Used   ??? Alcohol use No      Past Surgical History:   Procedure Laterality Date   ??? BACK SURGERY     ??? JOINT REPLACEMENT     ??? KNEE SURGERY     ??? NEPHRECTOMY TRANSPLANTED ORGAN     ??? PR COLONOSCOPY FLX DX W/COLLJ SPEC WHEN PFRMD N/A 07/15/2016    Procedure: COLONOSCOPY, FLEXIBLE, PROXIMAL TO SPLENIC FLEXURE; DIAGNOSTIC, W/WO COLLECTION SPECIMEN BY BRUSH OR WASH;  Surgeon: Maris Berger, MD;  Location: [facility];  Service: Gastroenterology   ??? PR COLSC FLX W/RMVL OF TUMOR POLYP LESION SNARE TQ Left 03/17/2013    Procedure: COLONOSCOPY FLEX; W/REMOV TUMOR/LES BY SNARE;  Surgeon: Malcolm Metro, MD;  Location: [facility]URES [facility];  Service: Gastroenterology   ??? PR SMALL BOWEL ENDOSCOPY,BIOPSY N/A 07/15/2016    Procedure: SM INTESTINAL ENDO NOT ILEUM; W/BX 1/MX;  Surgeon: Maris Berger, MD;  Location: GI PROC[facility]URES [facility];  Service: Gastroenterology   ??? PR THORACENTESIS  NEEDLE/CATH PLEURA W/IMAGING N/A 07/20/2015    Procedure: THORACENTESIS W/ IMAGING;  Surgeon: Sherwood Gambler, MD;  Location: BRONCH PROCEDURE LAB [facility];  Service: Pulmonary   ??? PR UP GI ENDOSCOPY,REMV TUMOR,SNARE N/A 11/23/2015    Procedure: UGI ENDO; W/REMOV TUMOR/POLYP/OTHER LES-SNARE;  Surgeon: Chriss Driver, MD;  Location: GI PROCEDURES [facility];  Service: Gastroenterology   ??? PR UPPER GI ENDOSCOPY,BIOPSY N/A 03/13/2015    Procedure: UGI ENDOSCOPY; WITH BIOPSY, SINGLE OR MULTIPLE;  Surgeon: Collie Siad, MD;  Location: [facility]URES [facility];  Service: Gastroenterology   ??? PR UPPER GI ENDOSCOPY,BIOPSY N/A 12/26/2015    Procedure: UGI ENDOSCOPY; WITH BIOPSY, SINGLE OR MULTIPLE;  Surgeon: Mayford Knife, MD;  Location: GI PROCEDURES [facility];  Service: Gastroenterology   ??? PR UPPER GI ENDOSCOPY,DIAGNOSIS N/A 03/17/2013    Procedure: UGI ENDO, INCLUDE ESOPHAGUS, STOMACH, & DUODENUM &/OR JEJUNUM; DX W/WO COLLECTION SPECIMN, BY BRUSH OR WASH;  Surgeon: Malcolm Metro, MD;  Location: GI PROCEDURES [facility];  Service: Gastroenterology    Family History   Problem Relation Age of Onset   ??? Cancer Mother    ??? Anesthesia problems Neg Hx         Oxycodone-acetaminophen     Objective Findings  Precautions / Restrictions  Isolation precautions;Falls precautions (contact, protective)    Weight Bearing  Non-applicable    Required Braces or Orthoses  Non-applicable    Communication Preference  Verbal;Visual    Pain  Pt initially had trouble identifying pain -- stated he had no pain and came to the hospital only for hiccups (H&P includes increased pain). Later told therapist he had back pain sitting up in the chair. Pt reported improvement with 2 pillows placed side-by-side on seat.    Equipment / Environment  Vascular access (PIV, TLC, Port-a-cath, PICC)    Living Situation   Living environment: House   Lives With: Spouse   Home Living: One level home;Stairs to enter with rails;Tub/shower unit;Grab bars in shower;Standard height toilet   Equipment available at home: None            Cognition   Comments: Appropriate    Vision / Perception     Perception: Appears WNL          Skin Inspection  visible skin c/d/i    ROM / Strength/Coordination  UE ROM/ Strength/ Coordination: WFL bilaterally  LE ROM/ Strength/ Coordination: WFL bilaterally    Sensation:       Balance:  Independent for sitting. Supervision for standing.    Mobility/Gait/Transfers: Supervision sit<>stand x2 trials and mobility in room.    ADL:  Bathing: Supervision  Grooming: Independent  Dressing: Independent for accessing LB sitting up in chair  Eating: Independent  Toileting: Supervision for t/f, independent for toileting       Vitals/ Orthostatics:  At Rest: NAD  With Activity: NAD       Interventions Performed During Today's Session: AMPAC 21/24. OT role, POC, home safety, falls prevention, provided moisturizer for skin protection, education on positioning for comfort and reduced back pain         Eval Duration (OT): 7 Min.    Medical Staff Made Aware: RN Harvin Hazel    I attest that I have reviewed the above information.  Signed: Bernarda Caffey, OT  Filed 07/26/2017

## 2017-07-26 NOTE — Unmapped (Signed)
Adult Nutrition Consult     Visit Type: RN Consult via Interdisciplinary Screening and Assessment Form. Identifiers: Loss of body weight without trying and Decreased appetite over the last month  Reason for Visit:  Assessment    ASSESSMENT:   HPI & PMH: Per MD note, Taylor Ramos??is a 77 y.o.??male??with PMHx of widely metastatic PTLD, ESRD s/p dd renal transplant in 2006 on Tacrolimus and Prednison, Afibb, Severe osteoarthritis,??presents to [facility] with increased pain, and intractable hiccups.    Nutrition Hx: weight loss and decr app over the past two months. Takes Megace at home. Sleeping at time of my visit. Cachectic appearing.  Nutritionally Pertinent Meds: Megace, multivitamin with minerals, prednisone, zofran q8hrs prn  Labs:   Lab Results   Component Value Date    NA 133 (L) 07/26/2017    K 5.0 07/26/2017    CL 107 07/26/2017    CO2 20.0 (L) 07/26/2017    BUN 52 (H) 07/26/2017    CREATININE 2.65 (H) 07/26/2017    GFR >= 60 08/18/2012    GLU 186 (H) 07/26/2017    CALCIUM 10.0 07/26/2017    ALBUMIN 3.5 07/24/2017    PHOS 3.0 07/26/2017       Recent Labs  Lab Units 07/26/17  0613   MAGNESIUM mg/dL 2.0     Skin:   Patient Lines/Drains/Airways Status    Active Wounds     None               Current nutrition therapy order:   Nutrition Orders          Nutrition Therapy General (Regular) starting at 02/01 1548           Anthropometric Data:  -- Height: 167.6 cm (5' 5.98)   -- Admission weight: 50kg  -- IBW: 64.44 kg  -- Percent IBW: 76%  -- BMI: Body mass index is 17.41 kg/m??.   -- Weight changes this admission:   Last 5 Recorded Weights    07/24/17 1403 07/24/17 1800   Weight: 50 kg (110 lb 3.7 oz) 48.9 kg (107 lb 12.8 oz)      -- Weight history PTA: loss of 7-10lbs over the past 2-3 weeks. This is a 5.9% body wt loss and significant  Wt Readings from Last 10 Encounters:   07/24/17 48.9 kg (107 lb 12.8 oz)   07/24/17 50.1 kg (110 lb 6.4 oz)   07/10/17 53.1 kg (117 lb)   06/26/17 51.9 kg (114 lb 6.4 oz)   03/04/17 54.8 kg (120 lb 12.8 oz)   01/23/17 55.4 kg (122 lb 3.2 oz)   12/19/16 56.6 kg (124 lb 12.8 oz)   11/04/16 58.9 kg (129 lb 12.8 oz)   07/22/16 56.8 kg (125 lb 3.2 oz)   07/15/16 65.6 kg (144 lb 10 oz)        Daily Estimated Nutrient Needs:   Energy: 1500-1750 kcals [  using admission body weight, 50 kg (07/26/17 1017)]  Protein: 60-72 gm [1.0-1.2 gm/kg using admission body weight, 50 kg (07/26/17 1017)]       Nutrition Focused Physical Exam:                   Nutrition Evaluation  Overall Impressions: Unable to perform Nutrition-Focused Physical Exam at this time due to (comment) (pt asleep) (07/26/17 1410)     DIAGNOSIS:  Malnutrition Assessment using AND/ASPEN Clinical Characteristics:    Severe Protein-Calorie Malnutrition in the context of acute illness or injury (07/26/17 1410)  Energy Intake: < or equal to 50% of estimated energy requirement of > or equal to 5 days  Interpretation of Wt. Loss: > 5% x 1 month               Overall nutrition impression:      Meets malnutrition criteria even without physical exam w decreased po intake and subsequent weight loss likely due to hiccups, fatigue, metastatic cancer. Hopefully w resolution of hiccups, small frequent meals, and appetite stimulant, can return to eating and be able to maintain weight. However further assessment with recommendations to follow once pt available for visit       GOALS:  Oral Intake:       - Patient to consume at least 75% of all meals per day.    Anthropometric:       - Gradual weight gain of 0.5 kg per week.  Laboratory Data:       - Electrolyte and renal profile will trend towards normal limits.    - Blood Glucose and/or A1C values trend towards normal limits.        RECOMMENDATIONS, INTERVENTIONS, AND GOALS:  1. Regular diet as ordered. Encourage small frequent meals and high protein/high calorie choices to maximize intake.  2. Pt aware of shake/supplement options available either at/between meals.  3. continue daily multivitamin with minerals.  4. Further assessment and recommendations to follow pending pt/family availability to obtain nutrition/weight hx as well as nutrition focused physical exam  5. Consider scheduled rather than prn anti-emetics to get ahead of nausea and hopefully help improve po  6. Please weigh pt at least weekly       RD Follow Up Parameters:  1-2 times per week (and more frequent as indicated)    Griffith Citron, MS, RD, LDN, CNSC  [phone]

## 2017-07-26 NOTE — Unmapped (Signed)
MedE1 Progress Note    Interval Events: Feels well this morning with a great appetite. He continues to have hiccups, notes the baclofen has not significantly helped he just has to deal with them. He has not experiences any hallucinations with baclofen nor zyprexa. He is willing to try at higher dose of baclofen while in house. He denies any pain. Reports normal UOP, but difficulty catching his urine to measure.     Assessment/Plan:    Active Problems:    Kidney replaced by transplant    Pain, generalized    Intractable hiccups  Resolved Problems:    * No resolved hospital problems. *      Taylor Ramos is a 77 y.o. male with PMHx of widely metastatic PTLD, ESRD s/p dd renal transplant in 2006 on Tacrolimus and Prednison, Afibb, Severe osteoarthritis, presents to [facility] with increased pain, and intractable hiccups. Given inability to treat metastatic PTLD, he has opted for home with hospice in order to maximize his time with his wife.  ??  #PTLD: Presented in to [facility] in summer of 2018 for 2nd opinion regarding his advanced, relapsed PTLD with involvement in the mesentery, L IJ LN, pulmonary nodules, peritoneum . He is s/p R-miniCHOP x6 (10/2016), O-GemOx x2 (02/04/17-02/24/17), R2 x61mo (03/2017-04/2017). Recently had been on Lenalidomide + Ritux. He did tolerate the Lenalidomide due to profuse diarrhea and pancytopenia. His recent CT scan on 07/09/2017 show cont progression of his disease with newer/adv masses in liver, spleen, left psoas, left superior acetabulum. Per review of his clinic notes, pt's prognosis is very poor, and there really no cancer-directed therapies left.   - Transition to home with hospice. His goal is to maximize time with his wife.     #ESRD s/p dd renal transplant in 2006 w AKI on CKD noted: Cr peak at 2.69, now downtrending. Baseline cr ~ 1.1- 1.3 but recently 1.79 on 1/18. FeNa < 1 suggesting pre-renal, tacrolimus trough 6.5. He does have a hx of possible cellular rejection in 2016.   - Strict I/O - Discuss future tac dosing with Nephrology and Dr. Carlene Coria  - Tac 3 mg BID for now  - Cont home pred 5 mg daily   - Cont Flomax     #Hiccups: Most pressing issue.. Most likely irritant is likely mass effect from know widely metastatic/progressive PTLD. Baclofen, Gabapentin are first line agents. Difficulty given pts prior reports of hallucinations. Also some case reports with use of Zyprexa. No hallucinations so far, he is tolerating well. Hiccups somewhat improved, but still present.   - Increase Baclofen 5-->10 mg TID PRN  - Zyprexa 2.5 mg qhs   ??  #Pain: Pt with long hx of chronic low back pain, knee pain, hip pain, shoulder pain. With hx of severe OA.  No signs of fractures on recent imaging. No red flag signs of cord compression or cauda equina syndrome. Exam reassuring against cord compression as well. Pain currently well controlled on current reg.   - Voltaren gel, Lido patch, and scheduled Tylenol   - Oxy 2.5 mg q4 hours PRN   ??  #Weight loss/Failure to Thrive: Decreased appetite and weight loss that is getting worse over last 2 months. Likely related to chronic pain, and progression of underlying PTLD.   -Continuing home Megace for now.   - Nutrition C/s  ??  #Pancytopenia : In setting of progressive PTLD and recent Lenalidomide, as well as Rituxan on 1/18. No signs or symptoms of bleeding on admission.   -  CTM with daily CBC with Diff   - Transfuse <7 and Plts <10  ??  ??  #Afib: rate controlled on Metoprolol 12.5 BID.   #Gout:  Allopurinol 200 daily     Dispo: home with hospice likely 07/27/17  ___________________________________________________________________    Labs/Studies:  Labs and Studies from the last 24hrs per EMR and Reviewed    Objective:  Temp:  [36.3 ??C-36.7 ??C] 36.7 ??C  Heart Rate:  [61-71] 70  Resp:  [16-18] 18  BP: (120-145)/(60-78) 136/62  SpO2:  [98 %-100 %] 98 %,     Intake/Output Summary (Last 24 hours) at 07/26/17 0535  Last data filed at 07/26/17 0000   Gross per 24 hour   Intake 319 ml   Output              300 ml   Net               19 ml       Patient Lines/Drains/Airways Status    Active Active Lines, Drains, & Airways     Name:   Placement date:   Placement time:   Site:   Days:    Non-Surgical Airway Nasal Cannula  07/15/16    1220        375    Power Port--a-Cath Single Hub Right Chest          Chest                    GEN:  Frail elderly male eating breakfast, NAD  HEENT: EOMI, MMM.  CARDIO: irregular rhythm, no murmurs.  RESPIRATORY: Normal work of breathing on RA. Clear to auscultation bilaterally.   ABDOMEN: BS present. Soft, non-tender, non-distended.  EXT: Warm and well-perfused, no edema  SKIN: No diaphoresis, no visible rashes

## 2017-07-26 NOTE — Unmapped (Signed)
Physician Discharge Summary    Identifying Information:   Taylor Ramos  03-Oct-1940  [id number]    Admit date: 07/24/2017    Discharge date: 07/27/2017     Discharge Service: Oncology/Hematology (MDE)    Discharge Attending Physician: Halford Decamp, MD    Discharge to: Home Hospice    Discharge Diagnoses:  Principal Problem:    Diffuse large B-cell lymphoma of intrapelvic lymph nodes (CMS-HCC)  Active Problems:    Kidney replaced by transplant    Atrial fibrillation (CMS-HCC)    Pain, generalized    Intractable hiccups  Resolved Problems:    * No resolved hospital problems. [facility] Course:   Taylor Ramos is a 77 year old male with a history of renal transplant (2006), atrial fibrillation, stage IV diffuse large B cell lymphoma with progressive disease who presented for Oncology Clinic with weight loss, intractable hiccups, and acute kidney injury.     PTLD: He is status post R2 (07/10/17- 07/17/2017); R2 x46mo (03/2017-04/2017); s/p O-GemOx x2 (02/04/17-02/24/17); s/p R-miniCHOP x6 (10/2016) with progression of disease. His primary oncologist, Taylor Ramos recommended hospice given the lack of cancer-directed therapies. The patient and his wife understood and he was transitioned to home with hospice care. Pt agreed to be DNR/DNI during the admission.     Hiccups: Largest concern for the patient are sporadic hiccups that tend to occur with speaking. They are improved with zyprexa and baclofen. After up titrating the Baclofen to 10 mg TID PRN, the pt did experience some increased confusion. That said, the pt is at high risk of delirium given his age, adv cancer, etc, and it is hard to attribute all of the confusion to the Baclofen. That said it may be reasonable to cont Baclofen at the lower dose of 5 mg TID, if it does help with symptoms. Otherwise, we prescribed low dose Zyprexa 2.5 mg BID for hiccups and to help with sleep at night, as there is some evidence to suggest this medicine helping with hiccups. Pain: Taylor Ramos has a history of hallucinations with opioids, however tolerated low dose oxycodone at 2.5 mg q4h PRN. For the most part his pain was controlled with tylenol 1 g q8h, lidocaine patches, and voltaren get QID. Should he have worsening pain would consider increasing his prednisone daily for lymphoma palliation.     AKI,  HTN Nephrosclerosis s/p renal transplant (10/2004): Presented with uptrending creatinine with a max of 2.69 from a prior baseline of ~1.1 - 1.3 in the setting of diarrhea from lenalidomide and poor oral intake. He was treated with IVF, continued to tacrolimus 3 mg BID with a trough of 6.7. His case was discussed with nephrology, who recommended continuing tacrolimus 3 mg BID despite no further lab draws. Continue prednisone 5 mg daily. His creatinine had down trended to 1.85 from 2.69 prior to d/c.     Post Discharge Follow Up Issues:   - Titrate medication for hiccups  - Adjust pain medication as needed, consider increasing prednisone for lymphoma palliation    Procedures:  None  No admission procedures for hospital encounter.  _____________________________________________________________________________  Discharge Day Services:  BP 146/75  - Pulse 105  - Temp 37 ??C (Oral)  - Resp 18  - Ht 167.6 cm (5' 5.98)  - Wt 48.9 kg (107 lb 12.8 oz)  - SpO2 98%  - BMI 17.41 kg/m??   Pt seen on the day of discharge and determined appropriate for discharge.  Condition at Discharge: stable    Length of Discharge: I spent greater than 30 mins in the discharge of this patient.  _____________________________________________________________________________  Discharge Medications:     Your Medication List      STOP taking these medications    aspirin 81 MG chewable tablet     lenalidomide 2.5 mg Cap capsule  Commonly known as:  REVLIMID     megestrol 400 mg/10 mL (40 mg/mL) suspension  Commonly known as:  MEGACE        START taking these medications    baclofen 5 mg Tab  Take 5 mg by mouth Three (3) times a day as needed (hiccups).     diclofenac sodium 1 % gel  Commonly known as:  VOLTAREN  Apply 2 g topically Four (4) times a day.     lidocaine 5 % patch  Commonly known as:  LIDODERM  Place 1 patch on the skin daily. Apply to affected area for 12 hours only each day (then remove patch)     LORazepam 2 mg/mL concentrated solution  Commonly known as:  ATIVAN  Take 0.25 mL (0.5 mg total) by mouth every eight (8) hours as needed for anxiety.     MORPhine 100 mg/5 mL (20 mg/mL) concentrated solution  Take 0.25 mL (5 mg total) by mouth every two (2) hours as needed for pain.     OLANZapine 2.5 MG tablet  Commonly known as:  ZYPREXA  Take 1 tablet (2.5 mg total) by mouth Two (2) times a day.     polyethylene glycol 17 gram packet  Commonly known as:  MIRALAX  Take 17 g by mouth daily.        CONTINUE taking these medications    acetaminophen 325 MG tablet  Commonly known as:  TYLENOL  Take by mouth every six (6) hours as needed for pain.     albuterol 90 mcg/actuation inhaler  Commonly known as:  PROAIR HFA  Inhale 2 puffs every four (4) hours as needed.     allopurinol 100 MG tablet  Commonly known as:  ZYLOPRIM  Take 2 tablets (200 mg total) by mouth daily.     C-TUB Misc  Generic drug:  miscellaneous medical supply  Take 1 Bottle by mouth.     carboxymethylcellulose sodium 0.25 % Drop  Commonly known as:  THERATEARS  Administer 2 drops to both eyes 4 (four) times a day as needed (Dry eyes).     colchicine 0.6 mg Cap capsule  Take 0.6 mg by mouth daily.     GAS RELIEF 80 MG chewable tablet  Generic drug:  simethicone  Chew 80 mg.     hypromellose 2.5 % ophthalmic solution  Commonly known as:  GONIOVISC  1 drop as needed.     metoprolol tartrate 25 MG tablet  Commonly known as:  LOPRESSOR  Take 0.5 tablets (12.5 mg total) by mouth Two (2) times a day.     multivitamin per tablet  Generic drug:  multivitamin  Take 1 tablet by mouth daily.     ondansetron 8 MG tablet  Commonly known as:  ZOFRAN  Take 1 tablet (8 mg total) by mouth every eight (8) hours as needed for nausea (or vomiting).     oxybutynin 5 MG 24 hr tablet  Commonly known as:  DITROPAN-XL  Take 5 mg by mouth daily. TAKE 1 TABLET (5 MG TOTAL) BY MOUTH DAILY.     pantoprazole 40 MG tablet  Commonly known  as:  PROTONIX  TAKE 1 TABLET BY MOUTH TWICE A DAY     predniSONE 5 MG tablet  Commonly known as:  DELTASONE  TAKE 1 TABLET (5 MG TOTAL) BY MOUTH DAILY.     prochlorperazine 10 MG tablet  Commonly known as:  COMPAZINE  Take 1 tablet (10 mg total) by mouth every six (6) hours as needed (nausea or vomitting).     sucralfate 1 gram tablet  Commonly known as:  CARAFATE  Take 1 g by mouth Four (4) times a day.     tacrolimus 1 MG capsule  Commonly known as:  PROGRAF  Take 3 capsules (3mg ) in the morning and 3 capsules (3mg ) at night. Z94.0, please keep on same generic if possible     tamsulosin 0.4 mg capsule  Commonly known as:  FLOMAX  TAKE 1 CAPSULE (0.4 MG TOTAL) BY MOUTH TWO (2) TIMES A DAY.          _____________________________________________________________________________  Pending Test Results (if blank, then none):      Most Recent Labs:  Microbiology Results (last day)     ** No results found for the last 24 hours. **          Lab Results   Component Value Date    WBC 0.8 (L) 07/27/2017    HGB 8.7 (L) 07/27/2017    HCT 26.5 (L) 07/27/2017    PLT 125 (L) 07/27/2017       Lab Results   Component Value Date    NA 132 (L) 07/27/2017    K 5.1 (H) 07/27/2017    CL 107 07/27/2017    CO2 22.0 07/27/2017    BUN 41 (H) 07/27/2017    CREATININE 1.85 (H) 07/27/2017    CALCIUM 10.3 (H) 07/27/2017    MG 1.9 07/26/2017    PHOS 2.3 (L) 07/26/2017       Lab Results   Component Value Date    ALKPHOS 69 07/24/2017    BILITOT 0.8 07/24/2017    BILIDIR 0.40 07/24/2017    PROT 5.4 (L) 07/24/2017    ALBUMIN 3.5 07/24/2017    ALT 17 (L) 07/24/2017    AST 52 07/24/2017    GGT 59 11/04/2016       Lab Results   Component Value Date    PT 16.3 (H) 12/03/2016    INR 1.39 12/03/2016 APTT 32.8 12/03/2016     Hospital Radiology:  No results found.    _____________________________________________________________________________  Discharge Instructions:               Follow Up instructions and Outpatient Referrals     Referral to hospice       Location:  External    Facility Type:  Home-based    Did you call Hospice before placing this order?:  Yes    Do you want ongoing co-management?:  Yes    Care coordination required?:  No    Admit to Home Hospice and treat for hospice diagnosis: PTLD.    Start of Care Date: Day after Discharge    Physician to follow patient's care (the person listed here will be responsible for signing ongoing orders):  Referring Provider:   Dr. Aviva Ramos    Please deliver cane to the hospital room.         Call MD for:  persistent nausea or vomiting       Call MD for:  severe uncontrolled pain       Discharge instructions  You have been admitted to [facility] for evaluation and treatment of hiccups and increased pain. While you were here we started you on some pain medicines, including Tylenol, Lidocaine patches, Voltaren ger, and a very low dose of morphine (which you only needed to use once for severe pain). Your pain was well controlled prior to you leaving the hospital. We recommend you continue these medicines for. You can use the low dose oxycodone for severe breakthru pain, but use with caution as this medicine can cause constipation, difficulty breathing, and confusion.     For your hiccups, we tried 2 medicines, called Baclofen and Zyprexa. The baclofen unfortunately may have contributed to some confusion while you were here. However, you may continue to try it at a lower dose of 5 mg up to three times a day as needed. You can stop the medicine if you feel like it is causing more confusion. The zyprexa may help, and so we recommend that you continue to use this twice a day, at low dose (try taking around 9 AM, and then the second dose before bed to help with sleep throughout the night).     While you were here, we also started to discuss the fact that you cancer continues to get worse, and unfortunately no real treatment options left. After a long discussion, you decided that your wishes would be to maximize quality of life, and specifically time at home with your wife. We discussed the option of hospice at home, which is a service that can help with support and symptom management at home. You stated that at this time you would like to go forward with home hospice, and so these resources were set up for you prior to discharge. Someone should be coming out to the house to get started at home either today or tomorrow. They can also help make adjustments to medicines for comfort, such as the pain regimen and medicines for hiccups noted above.     Seek medical attention if you develop significant uncontrolled nausea and vomiting, or other uncontrolled pain.  If you develop these symptoms, or if you have trouble obtaining any of your medications, you can call for appointments & questions Monday through Friday 8 AM- 5 PM  [phone] or Toll free [phone].On Nights, Weekends and Holidays Call [phone] and ask for the oncologist on call. or you can go to the [facility] at [facility] in [place]. You can also call the [facility] Link at [phone].     Please continue to follow-up with your outpatient care providers. Some of your follow-up appointments have been listed below.     Your discharge medications are listed here. Please continue to take these medications as directed.               Appointments which have been scheduled for you    Aug 03, 2017  1:40 PM EST  (Arrive by 1:25 PM)  NEW  GENERAL with Colon Branch, MD  [facility] AND BARIATRIC SURGERY [facility] REGION) [address]  [place] [address]  [phone]   Aug 04, 2017  2:15 PM EST  RETURN  TOTAL JOINT with Kimberlee Nearing, MD  [facility] ORTHOPAEDICS [facility] [place] [facility] REGION) [address]  [place] [address]  [phone]   Aug 07, 2017 10:30 AM EST  (Arrive by 10:00 AM)  NURSE LAB DRAW with ADULT ONC LAB  [facility]  ADULT ONCOLOGY LAB DRAW STATION [facility] REGION) [address]  [place] [address]  [phone]   Aug 07, 2017 11:30 AM EST  (Arrive by 11:00 AM)  RETURN ACTIVE [place] with Darel Hong, [place]  [facility] HEMATOLOGY ONCOLOGY 2ND FLR CANCER HOSP [facility] REGION) [address]  [place] [address]  [phone]   Aug 07, 2017 12:30 PM EST  (Arrive by 12:00 PM)  LEVEL 150 with [employer] CHAIR 48  [place] ONCOLOGY INFUSION [place] [facility] REGION) [address]  [place] [address]  [phone]

## 2017-07-26 NOTE — Unmapped (Signed)
Problem: Patient Care Overview  Goal: Plan of Care Review  Outcome: Progressing   07/26/17 0312   OTHER   Plan of Care Reviewed With patient   Plan of Care Review   Progress no change     VSS throughout this shift. Remains free from falls. Denies any pain. Given Baclofen x1 for hiccups with relief. Pt with 1 episode of N/V this shift; given Zofran x1 with relief. Pt still with poor PO intake and low UO. Pt voided 150 ml this AM; post-void residual bladder scan performed, showed 0 ml. Labs to be drawn by this RN at 0600. Pt currently resting in bed with eyes closed; no further needs expressed at this time. Will continue to monitor.     Goal: Individualization and Mutuality  Outcome: Progressing   07/25/17 0624 07/26/17 1610   Individualization   Patient Specific Preferences Cluster care; Baclofen for hiccups --    Patient Specific Interventions --  Labs; bladder scan   OTHER   What Anxieties, Fears, Concerns, or Questions Do You Have About Your Care? --  Prognosis   How Would You and/or Your Support Person Like to Participate in Your Care? Discussion of POC with care team --      Goal: Discharge Needs Assessment  Outcome: Progressing    Goal: Interprofessional Rounds/Family Conf  Outcome: Progressing   07/25/17 0624   Interdisciplinary Rounds/Family Conf   Participants nursing;patient       Problem: Self-Care Deficit (Adult,Obstetrics,Pediatric)  Goal: Identify Related Risk Factors and Signs and Symptoms  Related risk factors and signs and symptoms are identified upon initiation of Human Response Clinical Practice Guideline (CPG).   Outcome: Progressing   07/25/17 0440   Self-Care Deficit (Adult,Obstetrics,Pediatric)   Related Risk Factors (Self-Care Deficit) fatigue/weakness;immobility;pain/discomfort   Signs and Symptoms (Self-Care Deficit) decreased balance;decreased functional activity tolerance;dyspnea on exertion;gait unstable;requests assistance;weakness, paresis, paralysis     Goal: Improved Ability to Perform BADL and IADL  Patient will demonstrate the desired outcomes by discharge/transition of care.   Outcome: Progressing   07/26/17 0312   Self-Care Deficit (Adult,Obstetrics,Pediatric)   Improved Ability to Perform BADL and IADL making progress toward outcome       Problem: Fall Risk (Adult)  Goal: Identify Related Risk Factors and Signs and Symptoms  Related risk factors and signs and symptoms are identified upon initiation of Human Response Clinical Practice Guideline (CPG).   Outcome: Progressing   07/25/17 0440   Fall Risk (Adult)   Related Risk Factors (Fall Risk) age-related changes;bladder function altered;fatigue/slow reaction;gait/mobility problems;environment unfamiliar   Signs and Symptoms (Fall Risk) presence of risk factors     Goal: Absence of Fall  Patient will demonstrate the desired outcomes by discharge/transition of care.   Outcome: Progressing   07/26/17 0312   Fall Risk (Adult)   Absence of Fall making progress toward outcome

## 2017-07-26 NOTE — Unmapped (Signed)
Care Management  Initial Transition Planning Assessment    Per MD progress note: Taylor Ramos??is a 77 y.o.??male??with PMHx of widely metastatic PTLD, ESRD s/p dd renal transplant in 2006 on Tacrolimus and Prednison, Afibb, Severe osteoarthritis,??presents to [facility] with increased pain, and intractable hiccups. Given inability to treat metastatic PTLD, he has opted for home with hospice in order to maximize his time with his wife.              General  Care Manager assessed the patient by : In person interview with patient  Orientation Level: Oriented X4  Who provides care at home?: N/A   Type of Residence: Mailing Address:  [address]  [place] [address]  Contacts: Accompanied by: Alone  Password: [phone]  Contact Details: Primary Contact  Primary Contact Name: Ann  Primary Contact Relationship: Significant Other  Phone #1: [phone]  Patient Phone Number: [phone] (home)         Medical Provider(s): Huntley Dec, NP  Reason for Admission: Admitting Diagnosis:  pain  Past Medical History:   has a past medical history of Arthritis; Chronic kidney disease; Coronary artery disease; Gout; History of transfusion; Hypertension; Kidney transplant status, cadaveric (2006); and Peptic ulceration.  Past Surgical History:   has a past surgical history that includes Nephrectomy transplanted organ; Back surgery; Joint replacement; pr upper gi endoscopy,diagnosis (N/A, 03/17/2013); pr colsc flx w/rmvl of tumor polyp lesion snare tq (Left, 03/17/2013); pr upper gi endoscopy,biopsy (N/A, 03/13/2015); pr thoracentesis needle/cath pleura w/imaging (N/A, 07/20/2015); pr up gi endoscopy,remv tumor,snare (N/A, 11/23/2015); pr upper gi endoscopy,biopsy (N/A, 12/26/2015); Knee surgery; pr small bowel endoscopy,biopsy (N/A, 07/15/2016); and pr colonoscopy flx dx w/collj spec when pfrmd (N/A, 07/15/2016).   Previous admit date: 07/13/2016    Primary Insurance- Payor: AETNA MEDICARE ADV / Plan: AETNA MEDICARE ADV / Product Type: *No Product type* /   Secondary Insurance ??? None  Prescription Coverage ??? same as above  [phone] [place], [place] - [address]  [facility] [place] [facility] [place] [address]  [place] [facility] [place] - [address]   [facility] - [place] - [address]  [facility] [place] - [address]    Transportation home: Private vehicle  Level of function prior to admission: Independent    Contact/Decision Maker:    Writer Details: Primary Contact  Primary Contact Name: Ann  Primary Contact Relationship: Significant Other  Phone #1: [phone]    Advance Directive (Medical Treatment)  Does patient have an advance directive covering medical treatment?: Patient has advance directive covering medical treatment, copy not in chart.  Type of advance directive: : Living will  Advance directive covering medical treatment not in Chart:: Copy requested from family  Surrogate decision maker appointed:: Other (Comment)  Surrogate decision maker's name:: Era Bumpers  Information provided on advance directive:: No  Patient requests assistance:: No    Advance Directive (Mental Health Treatment)  Does patient have an advance directive covering mental health treatment?: Patient does not have advance directive covering mental health treatment., Patient would not like information.  Reason patient does not have an advance directive covering mental health treatment:: Patient does not wish to complete one at this time.    Patient Information:    Lives with: Spouse/significant other    Type of Residence: Private residence     Location/Detail: [place], [place]  Support Systems: Friends/Neighbors, Significant Other    Responsibilities/Dependents at home?: No    Home Care services in place prior to admission?: No    Equipment Currently Used at Home: cane, straight    Currently receiving outpatient dialysis?: No    Financial Information:     Patient source of income: Retirement    Need for financial assistance?: No    Discharge Needs Assessment:    Concerns to be Addressed: care coordination/care conferences    Clinical Risk Factors: Principal Diagnosis: Cancer, Stroke, COPD, Heart Failure, AMI, Pneumonia, Joint Replacment, > 65    Barriers to taking medications: No    Prior overnight hospital stay or ED visit in last 90 days: No    Readmission Within the Last 30 Days: no previous admission in last 30 days    Anticipated Changes Related to Illness: none    Equipment Needed After Discharge: cane, straight    Discharge Facility/Level of Care Needs: other (see comments) (Home hospice)    Patient at risk for readmission?: Yes    Discharge Plan:    Screen findings are: Discharge planning needs identified or anticipated (Comment).    Expected Discharge Date:      Expected Transfer from Critical Care:      Patient and/or family were provided with choice of facilities / services that are available and appropriate to meet post hospital care needs?: Yes   List choices in order highest to lowest preferred, if applicable. : No preference of agency for Home Hospice/DME    Initial Assessment complete?: Yes

## 2017-07-26 NOTE — Unmapped (Signed)
PHYSICAL THERAPY  Evaluation (07/26/17 0828)     Patient Name:  Taylor Ramos       Medical Record Number: [id number]   Date of Birth: 27-Jan-1941  Sex: Male            Treatment Diagnosis: Decreased gait stability; Decreased activity tolerance    ASSESSMENT    Taylor Ramos is a 77 y.o. male with PMHx of widely metastatic PTLD, ESRD s/p dd renal transplant in 2006 on Tacrolimus and Prednison, Afibb, Severe osteoarthritis, presents to [facility] with increased pain, and intractable hiccups. Pt presented to PT today with slightly decreased gait stability from baseline, decreased functional tolerance, and still with continued poor posturing noted. Pt able to transfer and amb with little assistance this day. Pt was indep with functional mobility at mostly household distances, living with his wife at home PTA.Based on the AM-PAC 6 item raw score of 21/24, the patient is considered to be 29.52% impaired with basic mobility. This indicates that the patient would benefit from 3x PT follow-up on discharge to address current mobility and stability deficits. Pt was a low complexity evaluation.     Today's Interventions: AMPAC: 21/24; PT Eval; Pt education regarding the role of PT, PT POC, current precautions, importance of continued progressive mobility, follow-up PT recs. follow-up AD recs    Activity Tolerance: Patient tolerated treatment well    PLAN  Planned Frequency of Treatment:  1-2x per day for: 2-3x week      Planned Interventions: Balance activities;Endurance activities;Education - Patient;Functional mobility;Gait training;Education - Family / caregiver;Therapeutic activity;Therapeutic exercise;Stair training;Self-care / Home training;Postural re-education;Transfer training    Post-Discharge Physical Therapy Recommendations:  3x weekly        PT DME Recommendations: Straight cane     Goals:   Patient and Family Goals: Pt wants to received home PT to improve current functional status.    Long Term Goal #1: 62-months: Pt will amb at least 500' indep without AD to regain indep mobility        SHORT GOAL #1: Pt will perform all functional transfers mod-I with LRAD.              Time Frame : 1 week  SHORT GOAL #2: Pt will amb at least ~250' mod-I with LRAD.              Time Frame : 1 week  SHORT GOAL #3: Pt will ascend/descend at least 3x stairs with rail and supervision.              Time Frame : 1 week                                        Prognosis:  Good     Positive Indicators: PLOF; Current LOF    SUBJECTIVE  Patient reports: Pt agreeable to PT this session  Current Functional Status: Pt received supine in bed, HOB elevated; Pt left reclined in bedside chair, needs in reach, RN [facility] made aware     Prior functional status: Pt reported that he has been functionally indep, amb at mostly household distances without AD, and has not been driving. Pt gets into community intermittently with assistance from wife who drives, and does the majority of their shopping. Pt denies having any functional issues amb community distances or with stairway negotiation.  Equipment available at home: None    Past Medical  History:   Diagnosis Date   ??? Arthritis    ??? Chronic kidney disease    ??? Coronary artery disease    ??? Gout    ??? History of transfusion    ??? Hypertension    ??? Kidney transplant status, cadaveric 2006   ??? Peptic ulceration     Social History   Substance Use Topics   ??? Smoking status: Former Smoker     Quit date: 03/17/1980   ??? Smokeless tobacco: Never Used   ??? Alcohol use No      Past Surgical History:   Procedure Laterality Date   ??? BACK SURGERY     ??? JOINT REPLACEMENT     ??? KNEE SURGERY     ??? NEPHRECTOMY TRANSPLANTED ORGAN     ??? PR COLONOSCOPY FLX DX W/COLLJ SPEC WHEN PFRMD N/A 07/15/2016    Procedure: COLONOSCOPY, FLEXIBLE, PROXIMAL TO SPLENIC FLEXURE; DIAGNOSTIC, W/WO COLLECTION SPECIMEN BY BRUSH OR WASH;  Surgeon: Maris Berger, MD;  [facility]: [facility];  Service: Gastroenterology   ??? PR COLSC FLX W/RMVL OF TUMOR POLYP LESION SNARE TQ Left 03/17/2013    Procedure: COLONOSCOPY FLEX; W/REMOV TUMOR/LES BY SNARE;  Surgeon: Malcolm Metro, MD;  Location: [facility];  Service: Gastroenterology   ??? PR SMALL BOWEL ENDOSCOPY,BIOPSY N/A 07/15/2016    Procedure: SM INTESTINAL ENDO NOT ILEUM; W/BX 1/MX;  Surgeon: Maris Berger, MD;  Location: [facility];  Service: Gastroenterology   ??? PR THORACENTESIS NEEDLE/CATH PLEURA W/IMAGING N/A 07/20/2015    Procedure: THORACENTESIS W/ IMAGING;  Surgeon: Sherwood Gambler, MD;  Location: BRONCH PROCEDURE LAB [facility];  Service: Pulmonary   ??? PR UP GI ENDOSCOPY,REMV TUMOR,SNARE N/A 11/23/2015    Procedure: UGI ENDO; W/REMOV TUMOR/POLYP/OTHER LES-SNARE;  Surgeon: Chriss Driver, MD;  Location: [facility];  Service: Gastroenterology   ??? PR UPPER GI ENDOSCOPY,BIOPSY N/A 03/13/2015    Procedure: UGI ENDOSCOPY; WITH BIOPSY, SINGLE OR MULTIPLE;  Surgeon: Collie Siad, MD;  Location: [facility];  Service: Gastroenterology   ??? PR UPPER GI ENDOSCOPY,BIOPSY N/A 12/26/2015    Procedure: UGI ENDOSCOPY; WITH BIOPSY, SINGLE OR MULTIPLE;  Surgeon: Mayford Knife, MD;  Location: [facility];  Service: Gastroenterology   ??? PR UPPER GI ENDOSCOPY,DIAGNOSIS N/A 03/17/2013    Procedure: UGI ENDO, INCLUDE ESOPHAGUS, STOMACH, & DUODENUM &/OR JEJUNUM; DX W/WO COLLECTION SPECIMN, BY BRUSH OR WASH;  Surgeon: Malcolm Metro, MD;  Location: [facility]URES [facility];  Service: Gastroenterology    Family History   Problem Relation Age of Onset   ??? Cancer Mother    ??? Anesthesia problems Neg Hx         Allergies: Oxycodone-acetaminophen                Objective Findings              Precautions: Falls;Other precautions (Contact precautions; Protective precautions)              Weight Bearing Status: Non- applicable              Required Braces or Orthoses: Non- applicable    Communication Preference: Verbal  Pain Comments: Pt reported 0/10 pain this AM  Medical Tests / Procedures: Labs, Imaging, Procedures reviewed in EPIC  Equipment / Environment: Vascular access (PIV, TLC, Port-a-cath, PICC)    At Rest: VSS per EPIC  With Activity: NAD  Orthostatics: Asymp  Living environment: House  Lives With: Spouse  Home Living: One level home;Stairs to enter with rails        Number of Stairs: 3    Cognition: Pt alert, oriented, and appropriate throghout session     Skin Inspection: R forearm post-surgical incision site raised however with no edema or erythema noted; all other visible skin intact    UE ROM: WFL  UE Strength: WFL  LE ROM: WFL  LE Strength: WFL                       Sensation: Pt with noted decreased LT sensation along the palmer side of the right hand, which patient described as a post-surgical complication from R forearm surgery s/p less than ~1 year ago; All other LT sensation intact   Balance: Pt sat EOB indep with and without UE support; Pt transferred with supervision, and amb with HHA, likely to be mod-I with SPC without any significant episodes instability; Pt able to amb short distances without UE support with supervision and without significant LOB   Posture: Significant increased kyphotic/forward head posturing noted     Bed Mobility: Pt performed supine to sit at the EOB indep with incrased time required with HOB slightly elevated  Transfers: Pt performed 2x sit to/from stand transfers with supervision and BUE push through bed surface, with increased effort required. Pt able to achieve tall standing without noted balance challenge, however with kyphotic posturing as described.   Gait: Pt amb ~200' with mostly HHA without LOB. Pt able to amb ~45' throughout without AD with mildly decreased gait stability and increased lateral sway noted with supervision. Pt's gait marked by decreased gait stability from baseline, decreased gait speed, and decreased functional endurance.   Stairs: Pt successfully trialed 3x simulated steps (single leg squats) with BLEs with BUE support through counter surface with supervision. Pt with increased effort required performing with the RLE compared with the LLE, but still successful.      Endurance: Fair    Eval Duration(PT): 28 Min.    Medical Staff Made Aware: RN Harvin Hazel made aware     I attest that I have reviewed the above information.  Signed: Fredrik Cove, PT  Filed 07/26/2017

## 2017-07-27 LAB — CBC W/ AUTO DIFF
EOSINOPHILS ABSOLUTE COUNT: 0 10*9/L (ref 0.0–0.4)
HEMATOCRIT: 26.5 % — ABNORMAL LOW (ref 41.0–53.0)
HEMOGLOBIN: 8.7 g/dL — ABNORMAL LOW (ref 13.5–17.5)
LARGE UNSTAINED CELLS: 4 % (ref 0–4)
MEAN CORPUSCULAR HEMOGLOBIN CONC: 32.6 g/dL (ref 31.0–37.0)
MEAN CORPUSCULAR HEMOGLOBIN: 27.1 pg (ref 26.0–34.0)
MEAN CORPUSCULAR VOLUME: 83 fL (ref 80.0–100.0)
MEAN PLATELET VOLUME: 7.9 fL (ref 7.0–10.0)
MONOCYTES ABSOLUTE COUNT: 0.2 10*9/L (ref 0.2–0.8)
NEUTROPHILS ABSOLUTE COUNT: 0.5 10*9/L — ABNORMAL LOW (ref 2.0–7.5)
PLATELET COUNT: 125 10*9/L — ABNORMAL LOW (ref 150–440)
RED BLOOD CELL COUNT: 3.2 10*12/L — ABNORMAL LOW (ref 4.50–5.90)
RED CELL DISTRIBUTION WIDTH: 19.6 % — ABNORMAL HIGH (ref 12.0–15.0)
WBC ADJUSTED: 0.8 10*9/L — ABNORMAL LOW (ref 4.5–11.0)

## 2017-07-27 LAB — BASIC METABOLIC PANEL
ANION GAP: 3 mmol/L — ABNORMAL LOW (ref 9–15)
BLOOD UREA NITROGEN: 41 mg/dL — ABNORMAL HIGH (ref 7–21)
BUN / CREAT RATIO: 22
CALCIUM: 10.3 mg/dL — ABNORMAL HIGH (ref 8.5–10.2)
CHLORIDE: 107 mmol/L (ref 98–107)
CREATININE: 1.85 mg/dL — ABNORMAL HIGH (ref 0.70–1.30)
EGFR MDRD AF AMER: 43 mL/min/{1.73_m2} — ABNORMAL LOW (ref >=60)
GLUCOSE RANDOM: 89 mg/dL (ref 65–179)
POTASSIUM: 5.1 mmol/L — ABNORMAL HIGH (ref 3.5–5.0)
SODIUM: 132 mmol/L — ABNORMAL LOW (ref 135–145)

## 2017-07-27 LAB — BASOPHILS ABSOLUTE COUNT: Lab: 0

## 2017-07-27 LAB — POTASSIUM: Potassium:SCnc:Pt:Ser/Plas:Qn:: 5.1 — ABNORMAL HIGH

## 2017-07-27 MED ORDER — LIDOCAINE 5 % TOPICAL PATCH
MEDICATED_PATCH | Freq: Every day | TRANSDERMAL | 0 refills | 0 days | Status: CP
Start: 2017-07-27 — End: 2017-08-26

## 2017-07-27 MED ORDER — MORPHINE CONCENTRATE 100 MG/5 ML (20 MG/ML) ORAL SOLUTION
ORAL | 0 refills | 0.00000 days | Status: CP | PRN
Start: 2017-07-27 — End: ?

## 2017-07-27 MED ORDER — BACLOFEN 5 MG TABLET
ORAL_TABLET | Freq: Three times a day (TID) | ORAL | 0 refills | 0 days | Status: CP | PRN
Start: 2017-07-27 — End: 2017-08-26

## 2017-07-27 MED ORDER — LORAZEPAM 2 MG/ML ORAL CONCENTRATE
Freq: Three times a day (TID) | ORAL | 0 refills | 0 days | Status: CP | PRN
Start: 2017-07-27 — End: ?

## 2017-07-27 MED ORDER — DICLOFENAC 1 % TOPICAL GEL
Freq: Four times a day (QID) | TOPICAL | 0 refills | 0 days | Status: CP
Start: 2017-07-27 — End: 2018-07-27

## 2017-07-27 MED ORDER — POLYETHYLENE GLYCOL 3350 17 GRAM ORAL POWDER PACKET
PACK | Freq: Every day | ORAL | 0 refills | 0.00000 days | Status: CP
Start: 2017-07-27 — End: ?

## 2017-07-27 MED ORDER — OLANZAPINE 2.5 MG TABLET
ORAL_TABLET | Freq: Two times a day (BID) | ORAL | 0 refills | 0.00000 days | Status: CP
Start: 2017-07-27 — End: 2017-08-26

## 2017-07-27 NOTE — Unmapped (Signed)
Problem: Patient Care Overview  Goal: Plan of Care Review  Outcome: Progressing  VSS, afebrile. Patient with no PO intake this shift, other than sips with meds. Denied any pain this shift. Voided without issue this shift, post void bladder scan 24ml. Pt appeared confused in early morning, pt stated its time for him to go home at 2am, again at 3am. Easily reoriented, pt back to sleep stated he had bad dreams. Schedule for DC later today. WCM.

## 2017-07-27 NOTE — Unmapped (Signed)
Problem: Patient Care Overview  Goal: Plan of Care Review  . VSS,afebrile, absent of falls, call bell within reach,low bed,bedside table within reach. Pt did/did not ambulate today. Oral intake was acceptable. Pt pain score today was 0/10.  Pt was somewhat anxious and ready to go home. I spoke with his wife to inform her that he was being discharged, pt was provided with a cane for assistance ambulating.Will continue plan of care per doctors orders. Will continue to monitor.   07/27/17 1434   OTHER   Plan of Care Reviewed With spouse   Plan of Care Review   Progress no change       Problem: Self-Care Deficit (Adult,Obstetrics,Pediatric)  Goal: Identify Related Risk Factors and Signs and Symptoms  Related risk factors and signs and symptoms are identified upon initiation of Human Response Clinical Practice Guideline (CPG).   Outcome: Progressing    Goal: Improved Ability to Perform BADL and IADL  Patient will demonstrate the desired outcomes by discharge/transition of care.   Outcome: Progressing      Problem: Fall Risk (Adult)  Goal: Identify Related Risk Factors and Signs and Symptoms  Related risk factors and signs and symptoms are identified upon initiation of Human Response Clinical Practice Guideline (CPG).   Outcome: Progressing    Goal: Absence of Fall  Patient will demonstrate the desired outcomes by discharge/transition of care.   Outcome: Progressing      Problem: VTE, DVT and PE (Adult)  Goal: Signs and Symptoms of Listed Potential Problems Will be Absent, Minimized or Managed (VTE, DVT and PE)  Signs and symptoms of listed potential problems will be absent, minimized or managed by discharge/transition of care (reference VTE, DVT and PE (Adult) CPG).   Outcome: Progressing

## 2017-07-27 NOTE — Unmapped (Signed)
Problem: Patient Care Overview  Goal: Plan of Care Review  Outcome: Progressing   07/26/17 1754   OTHER   Plan of Care Reviewed With patient   Plan of Care Review   Progress no change     Goal: Individualization and Mutuality  Outcome: Progressing   07/25/17 0624 07/26/17 1754   Individualization   Patient Specific Preferences Cluster care; Baclofen for hiccups --    Patient Specific Goals (Include Timeframe) --  D/C tomorrow to be with wife   Patient Specific Interventions --  Labs drawn, bladder scan, I&O cath attempted   OTHER   What Anxieties, Fears, Concerns, or Questions Do You Have About Your Care? --  prognosis, I&O cath procedure   How to Address Anxieties/Fears --  Answer questions, patient education, provide emotional support       Comments: Patient afebrile, VSS this shift. No complaints of pain. Patient morning bladder scan showed of urine, patient stated he didn't have to urinate at that time. Post void residual done after patient urinated in the afternoon, and bladder scan showed of urine. Provider notified and I&O cath ordered. RN attempted I&O cath but was unsuccessful after meeting resistance, however patient was able to void another following I&O cath attempt. Provider notified about outcome and that blood was seen on tip of catheter after removal. Patient possibly being discharged to hospice tomorrow. WCTM.

## 2017-07-27 NOTE — Unmapped (Signed)
Durable Medical Equipment - Selection Complete     Service Request Status Selected Specialties Address Phone Number Fax Number    [facility] Selected DME Services [address] [place], [place] [address] [phone]      [facility] - Selection Complete     Service Request Status Selected Specialties Address Phone Number Fax Number    [facility] [address] [place] [address] [phone]

## 2017-07-28 NOTE — Unmapped (Signed)
Additional order received and the patient is already on the caseload. Continue with  plan of care.

## 2017-07-28 NOTE — Unmapped (Signed)
Prior authorization prompted by outpatient pharmacy on 07/28/2017 with/ from [facility] via CoverMyMeds website.    Medication: lidocaine 5% patches, diclofenac 1% gel    Reference ID: [id number], [id number]     Status: Outpatient prescription written by [name] inpatient provider has been prompted for a prior authorization by the outpatient pharmacy on CoverMyMeds and automatically routed to the [facility] PA department. PCP not an [facility] provider; faxed PA information to patient's PCP office.    Pharmacy Notified: no.    Patient Notified: no.    Provider Notified: yes, via fax.    Clinical Information: not IMC.

## 2017-07-30 NOTE — Unmapped (Signed)
Error

## 2017-08-11 NOTE — Unmapped (Signed)
Spoke with patient's wife & she states patient passed away 2017-08-19

## 2017-08-21 DEATH — deceased

## 2017-09-02 ENCOUNTER — Ambulatory Visit: Payer: Commercial Managed Care - HMO | Admitting: Urology

## 2017-09-03 ENCOUNTER — Other Ambulatory Visit: Payer: Self-pay | Admitting: Nurse Practitioner

## 2017-12-11 IMAGING — CT CT ABD-PELV W/O CM
2 of 4 series · 15 of 46 positions shown, 17 images · non-contrast
Comparison: 06/13/2016 CT lumbar spine, PET-CT 04/28/2016

CLINICAL DATA: Lymphoma on chemotherapy. Treated 1 week ago now
complaining of lower abdominal pain with diarrhea, nausea and
vomiting.

EXAM:
CT ABDOMEN AND PELVIS WITHOUT CONTRAST
TECHNIQUE: Multidetector CT imaging of the abdomen and pelvis was performed
following the standard protocol without IV contrast.

[Series 2: routine abd/pel wo · axial · 0.75mm/px · z∈[-891,-446]mm · 12 of 99 slices shown, 14 images]
[im 5/99  soft-tissue]
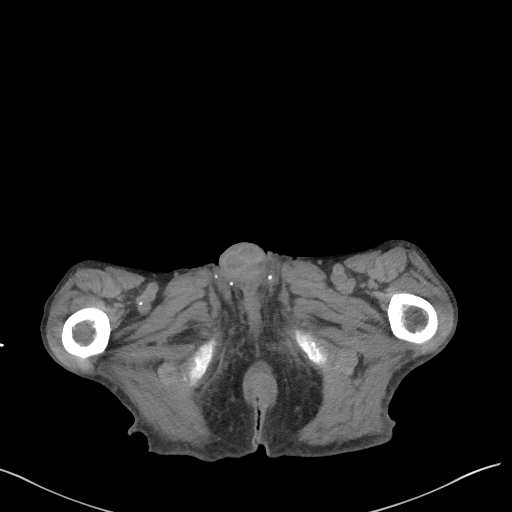
[im 5/99  bone]
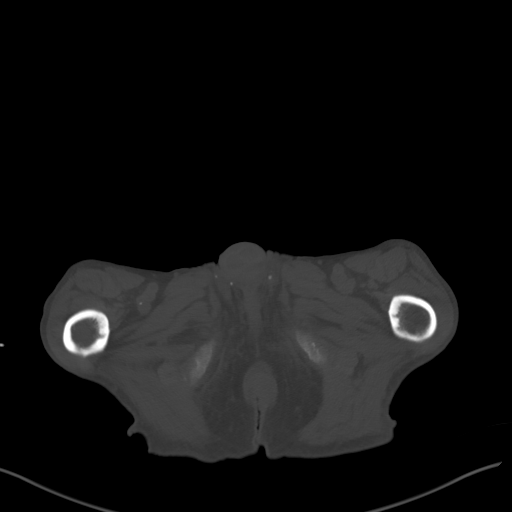
[im 13/99  soft-tissue]
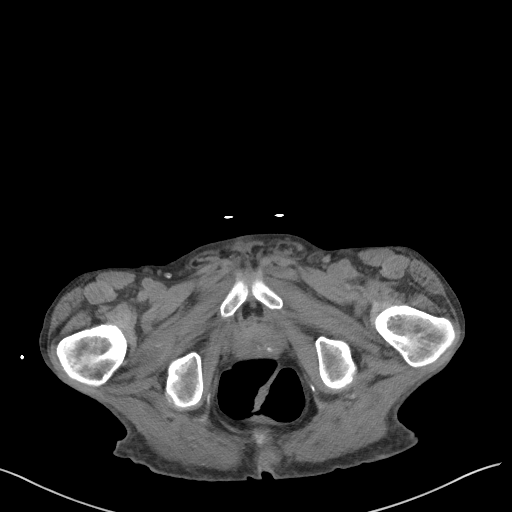
[im 21/99  soft-tissue]
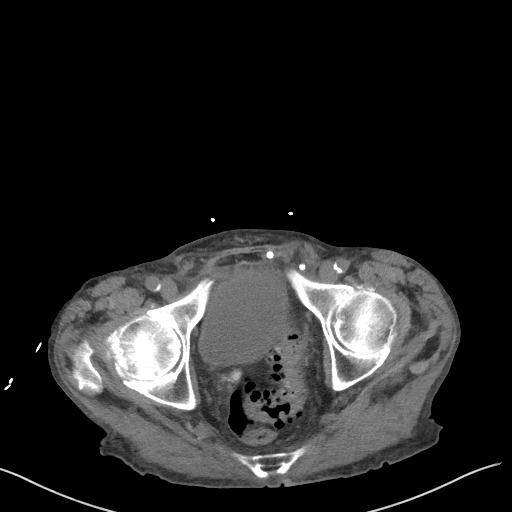
[im 29/99  soft-tissue]
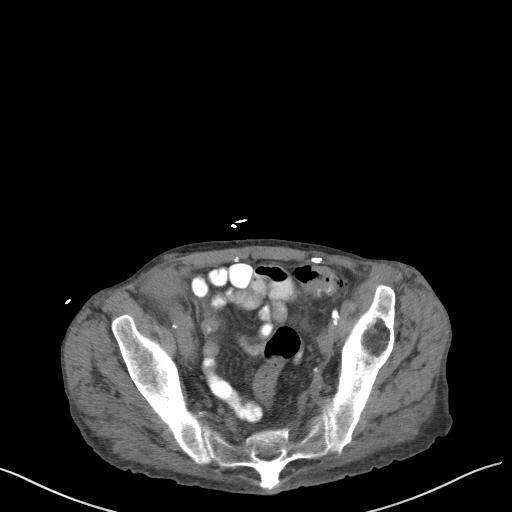
[im 37/99  soft-tissue]
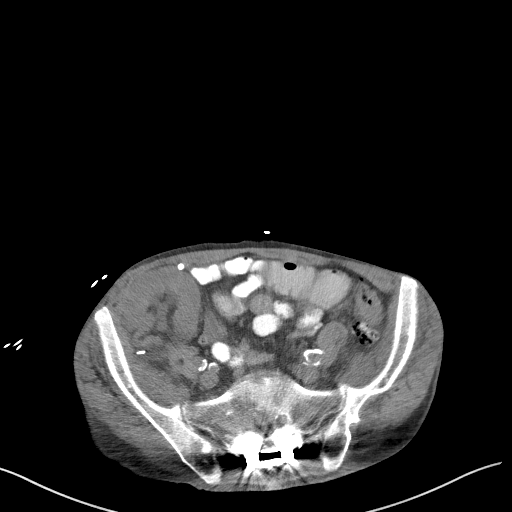
[im 45/99  soft-tissue]
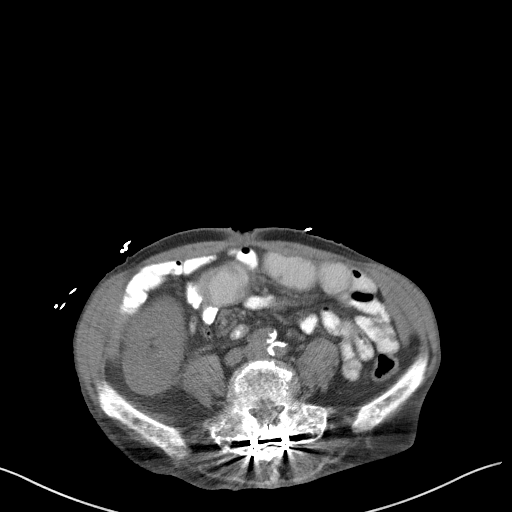
[im 54/99  soft-tissue]
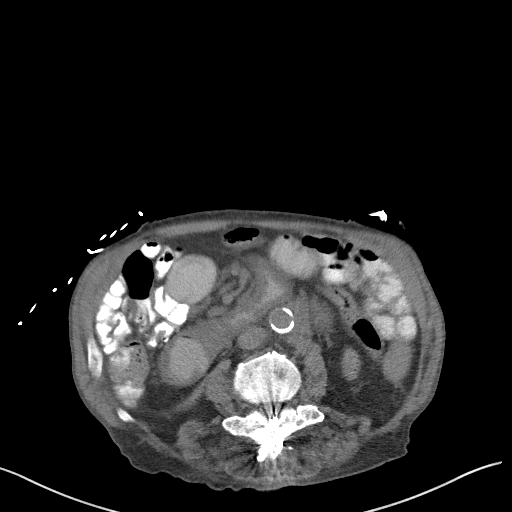
[im 62/99  soft-tissue]
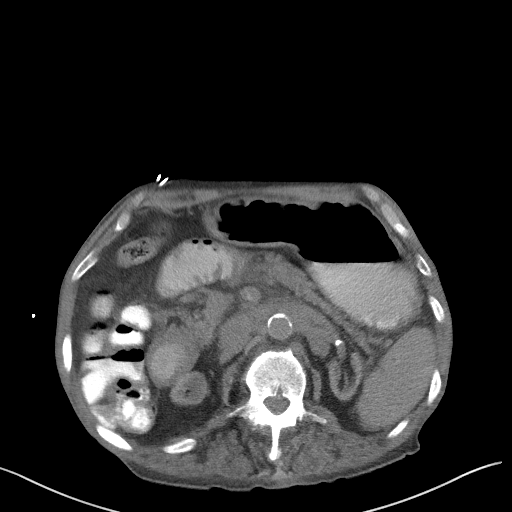
[im 70/99  soft-tissue]
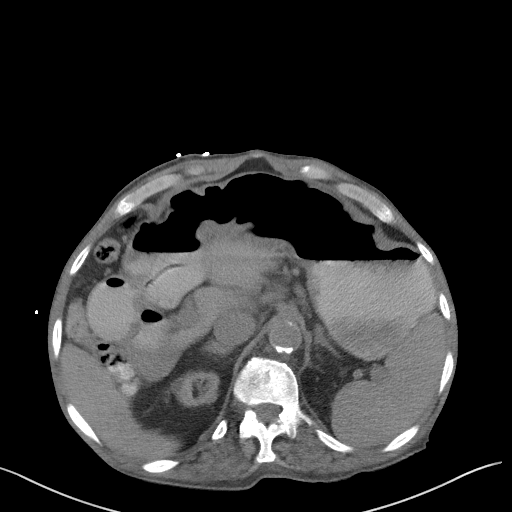
[im 70/99  bone]
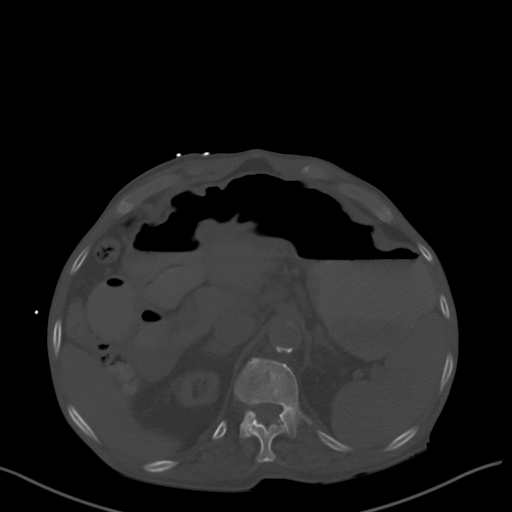
[im 78/99  soft-tissue]
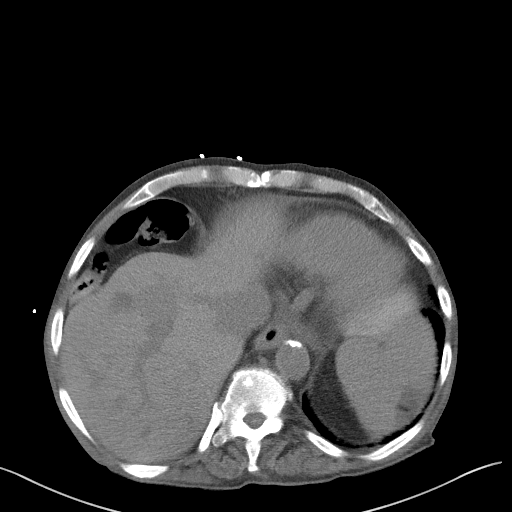
[im 86/99  soft-tissue]
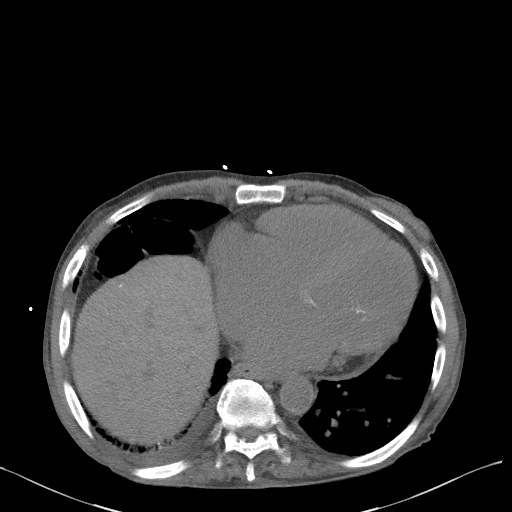
[im 94/99  soft-tissue]
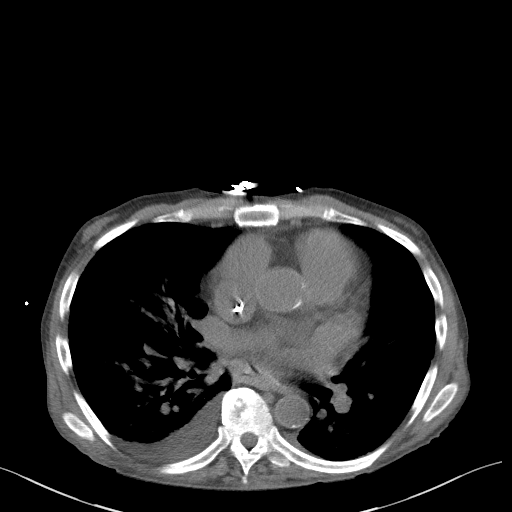

[Series 5: coronal st · coronal · 0.76mm/px · 3 of 83 slices shown]
[im 28/83  soft-tissue]
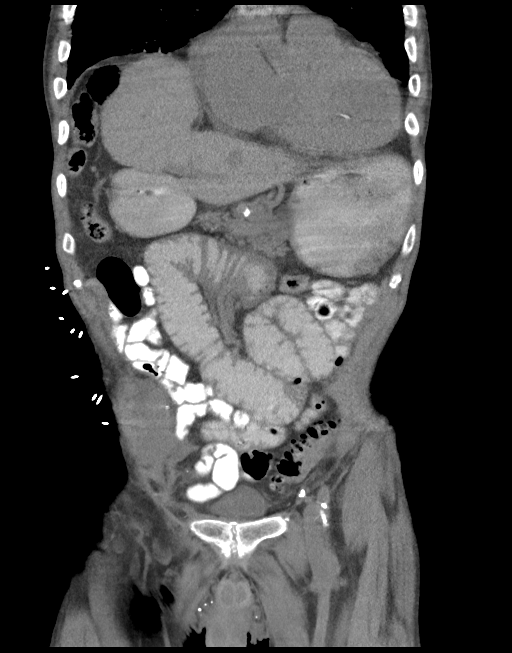
[im 37/83  soft-tissue]
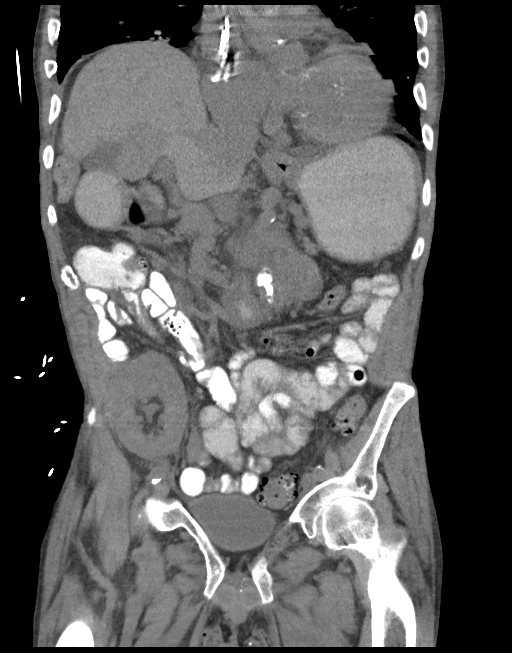
[im 46/83  soft-tissue]
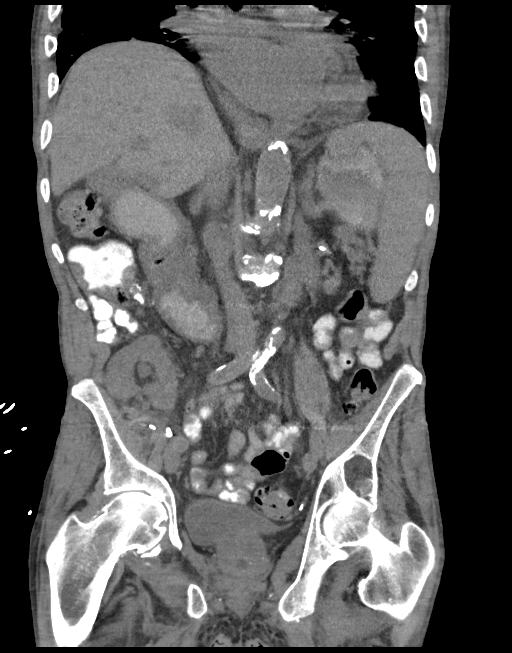

[15 of 46 positions shown; findings below may reference images not displayed]

FINDINGS: Lower chest: Cardiomegaly with coronary arteriosclerosis. Port
catheter tip in the cavoatrial junction. Small bilateral pleural
effusions right greater than left with adjacent atelectasis. Minimal
lingular atelectasis. Thoracic aortic atherosclerosis.

Hepatobiliary: The unenhanced liver demonstrates no focal mass.
Punctate calcification in the right hepatic lobe possibly a small
granuloma or vascular calcification. Gallbladder does not appear to
be distended. No gallstones are seen.

Pancreas: Atrophic pancreas.

Spleen: No splenomegaly.

Adrenals/Urinary Tract: Normal appearing bilateral adrenal glands.
Atrophic bilateral kidneys with renovascular calcifications
bilaterally. Small cortical and exophytic lesions are noted on the
right, measuring between 13 and 16 mm. The exophytic lesion is not
simple by CT may represent a complex or proteinaceous cyst. Further
characterization is not possible given lack of IV contrast. Pelvic
right kidney is noted.

Stomach/Bowel: Contrast distended slightly thickened stomach with
contrast filled mildly thickened proximal small bowel loops
consistent with gastroenteritis. Oral contrast reaches large bowel.
Normal-appearing appendix. Descending colonic and sigmoid
diverticulosis without acute diverticulitis.

Vascular/Lymphatic: The mesenteric and retroperitoneal periaortic
adenopathy appears to have slightly decreased in size since prior
PET-CT from 04/28/2016. Right para-aortic lymph node measured at the
same level previously now measures 1.6 cm in thickness versus 3.2 cm
previously. Left para- aortic adenopathy now measures approximately
3.8 cm versus 5 cm previously. Mesenteric adenopathy now measures
1.7 cm versus 2.5 cm.

Reproductive: Normal sized partially calcified prostate. Left
inguinal surgical clips are present.

Other: No free air or free fluid.

Musculoskeletal: Solid osseous fusion of the posterior elements from
L3 through S1 with cerclage wires in place. Degenerative disc
disease with height loss from L1 through L3. Subtle heterogeneity of
the L3 through L5 vertebral bodies as previously described which
cannot exclude lytic disease. Osteoarthritis of hips with
subchondral cysts noted about both hips. Lytic lesion of the left
sacrum with soft tissue component.
IMPRESSION: Fluid-filled distended and slightly thickened appearance of the
stomach and proximal small bowel consistent with gastroenteritis. No
bowel obstruction noted.

Interval decrease in size of retroperitoneal and mesenteric
lymphadenopathy. Small bilateral pleural effusions right greater
than left.

Right transplanted pelvic kidney with atrophic bilateral native
kidneys. Small stable right-sided renal cysts.

Lytic lucencies of L3, L4 and L5 and the left sacrum consistent
osteolytic metastatic disease.
